# Patient Record
Sex: Female | Born: 1970 | Race: White | Hispanic: No | Marital: Single
Health system: Southern US, Academic
[De-identification: ages and names within clinical notes are randomized; demographics above are authoritative.]

## PROBLEM LIST (undated history)

## (undated) ENCOUNTER — Encounter

## (undated) ENCOUNTER — Telehealth

## (undated) ENCOUNTER — Ambulatory Visit: Payer: PRIVATE HEALTH INSURANCE

## (undated) ENCOUNTER — Encounter: Attending: Physician Assistant | Primary: Physician Assistant

## (undated) ENCOUNTER — Ambulatory Visit

## (undated) ENCOUNTER — Encounter: Attending: General Acute Care Hospital | Primary: General Acute Care Hospital

## (undated) DIAGNOSIS — Z9889 Other specified postprocedural states: Secondary | ICD-10-CM

## (undated) DIAGNOSIS — K219 Gastro-esophageal reflux disease without esophagitis: Secondary | ICD-10-CM

## (undated) DIAGNOSIS — N3281 Overactive bladder: Secondary | ICD-10-CM

## (undated) DIAGNOSIS — D649 Anemia, unspecified: Secondary | ICD-10-CM

## (undated) DIAGNOSIS — T8859XA Other complications of anesthesia, initial encounter: Secondary | ICD-10-CM

## (undated) DIAGNOSIS — F329 Major depressive disorder, single episode, unspecified: Secondary | ICD-10-CM

## (undated) DIAGNOSIS — G43909 Migraine, unspecified, not intractable, without status migrainosus: Secondary | ICD-10-CM

## (undated) DIAGNOSIS — J45909 Unspecified asthma, uncomplicated: Secondary | ICD-10-CM

## (undated) DIAGNOSIS — F32A Depression, unspecified: Secondary | ICD-10-CM

## (undated) DIAGNOSIS — R112 Nausea with vomiting, unspecified: Secondary | ICD-10-CM

## (undated) DIAGNOSIS — T4145XA Adverse effect of unspecified anesthetic, initial encounter: Secondary | ICD-10-CM

## (undated) DIAGNOSIS — J302 Other seasonal allergic rhinitis: Secondary | ICD-10-CM

## (undated) DIAGNOSIS — F419 Anxiety disorder, unspecified: Secondary | ICD-10-CM

## (undated) DIAGNOSIS — C50919 Malignant neoplasm of unspecified site of unspecified female breast: Secondary | ICD-10-CM

## (undated) HISTORY — DX: Unspecified asthma, uncomplicated: J45.909

## (undated) HISTORY — PX: ABDOMINAL HYSTERECTOMY: SHX81

## (undated) HISTORY — DX: Anxiety disorder, unspecified: F41.9

## (undated) HISTORY — DX: Overactive bladder: N32.81

## (undated) HISTORY — DX: Gastro-esophageal reflux disease without esophagitis: K21.9

## (undated) HISTORY — DX: Major depressive disorder, single episode, unspecified: F32.9

## (undated) HISTORY — DX: Other seasonal allergic rhinitis: J30.2

## (undated) HISTORY — DX: Malignant neoplasm of unspecified site of unspecified female breast: C50.919

## (undated) HISTORY — PX: SINUSOTOMY: SHX291

## (undated) HISTORY — DX: Depression, unspecified: F32.A

## (undated) HISTORY — PX: BREAST SURGERY: SHX581

## (undated) HISTORY — DX: Migraine, unspecified, not intractable, without status migrainosus: G43.909

## (undated) HISTORY — DX: Anemia, unspecified: D64.9

---
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
## 2015-06-11 HISTORY — PX: MASTECTOMY: SHX3

## 2016-01-18 DIAGNOSIS — C50912 Malignant neoplasm of unspecified site of left female breast: Secondary | ICD-10-CM | POA: Insufficient documentation

## 2016-12-19 ENCOUNTER — Encounter (INDEPENDENT_AMBULATORY_CARE_PROVIDER_SITE_OTHER): Payer: Self-pay

## 2017-01-03 ENCOUNTER — Ambulatory Visit
Admission: RE | Admit: 2017-01-03 | Discharge: 2017-01-03 | Disposition: A | Discharge status: Home / Self Care | Payer: Commercial Managed Care - PPO | Attending: Surgery | Admitting: Surgery

## 2017-01-03 ENCOUNTER — Ambulatory Visit
Admission: RE | Admit: 2017-01-03 | Discharge: 2017-01-03 | Disposition: A | Discharge status: Home / Self Care | Payer: Commercial Managed Care - PPO

## 2017-01-03 DIAGNOSIS — M549 Dorsalgia, unspecified: Principal | ICD-10-CM

## 2017-01-03 DIAGNOSIS — Z170 Estrogen receptor positive status [ER+]: Secondary | ICD-10-CM

## 2017-01-03 DIAGNOSIS — C50912 Malignant neoplasm of unspecified site of left female breast: Principal | ICD-10-CM

## 2017-01-05 DIAGNOSIS — Z170 Estrogen receptor positive status [ER+]: Secondary | ICD-10-CM

## 2017-01-05 DIAGNOSIS — C50112 Malignant neoplasm of central portion of left female breast: Secondary | ICD-10-CM | POA: Insufficient documentation

## 2017-01-16 ENCOUNTER — Ambulatory Visit
Admission: RE | Admit: 2017-01-16 | Discharge: 2017-01-16 | Disposition: A | Discharge status: Home / Self Care | Payer: Commercial Managed Care - PPO | Attending: Hematology & Oncology | Admitting: Hematology & Oncology

## 2017-01-16 ENCOUNTER — Ambulatory Visit
Admission: RE | Admit: 2017-01-16 | Discharge: 2017-01-16 | Disposition: A | Discharge status: Home / Self Care | Payer: Commercial Managed Care - PPO

## 2017-01-16 DIAGNOSIS — C50112 Malignant neoplasm of central portion of left female breast: Principal | ICD-10-CM

## 2017-01-16 DIAGNOSIS — Z170 Estrogen receptor positive status [ER+]: Secondary | ICD-10-CM

## 2017-01-16 DIAGNOSIS — Z853 Personal history of malignant neoplasm of breast: Secondary | ICD-10-CM

## 2017-01-16 MED ORDER — TAMOXIFEN 20 MG TABLET
ORAL_TABLET | Freq: Every day | ORAL | 5 refills | 0 days | Status: CP
Start: 2017-01-16 — End: 2017-06-13

## 2017-02-04 ENCOUNTER — Ambulatory Visit
Admission: RE | Admit: 2017-02-04 | Discharge: 2017-02-04 | Disposition: A | Discharge status: Home / Self Care | Payer: Commercial Managed Care - PPO

## 2017-02-04 ENCOUNTER — Ambulatory Visit
Admission: RE | Admit: 2017-02-04 | Discharge: 2017-02-04 | Disposition: A | Discharge status: Home / Self Care | Payer: Commercial Managed Care - PPO | Attending: MS" | Admitting: MS"

## 2017-02-04 DIAGNOSIS — Z853 Personal history of malignant neoplasm of breast: Principal | ICD-10-CM

## 2017-02-04 DIAGNOSIS — Z7183 Encounter for nonprocreative genetic counseling: Principal | ICD-10-CM

## 2017-02-12 ENCOUNTER — Encounter: Payer: Self-pay | Admitting: Family Medicine

## 2017-02-12 ENCOUNTER — Ambulatory Visit (INDEPENDENT_AMBULATORY_CARE_PROVIDER_SITE_OTHER): Payer: Managed Care, Other (non HMO) | Admitting: Family Medicine

## 2017-02-12 VITALS — BP 112/64 | HR 76 | Temp 99.1°F | Resp 16 | Ht 66.0 in | Wt 209.1 lb

## 2017-02-12 DIAGNOSIS — M2550 Pain in unspecified joint: Secondary | ICD-10-CM | POA: Diagnosis not present

## 2017-02-12 DIAGNOSIS — Z170 Estrogen receptor positive status [ER+]: Secondary | ICD-10-CM | POA: Diagnosis not present

## 2017-02-12 DIAGNOSIS — Z23 Encounter for immunization: Secondary | ICD-10-CM | POA: Diagnosis not present

## 2017-02-12 DIAGNOSIS — E6609 Other obesity due to excess calories: Secondary | ICD-10-CM | POA: Insufficient documentation

## 2017-02-12 DIAGNOSIS — C50112 Malignant neoplasm of central portion of left female breast: Secondary | ICD-10-CM | POA: Diagnosis not present

## 2017-02-12 DIAGNOSIS — G4730 Sleep apnea, unspecified: Secondary | ICD-10-CM | POA: Diagnosis not present

## 2017-02-12 DIAGNOSIS — R768 Other specified abnormal immunological findings in serum: Secondary | ICD-10-CM | POA: Diagnosis not present

## 2017-02-12 DIAGNOSIS — K130 Diseases of lips: Secondary | ICD-10-CM

## 2017-02-12 DIAGNOSIS — G43909 Migraine, unspecified, not intractable, without status migrainosus: Secondary | ICD-10-CM | POA: Insufficient documentation

## 2017-02-12 DIAGNOSIS — Z6833 Body mass index (BMI) 33.0-33.9, adult: Secondary | ICD-10-CM | POA: Diagnosis not present

## 2017-02-12 DIAGNOSIS — J45909 Unspecified asthma, uncomplicated: Secondary | ICD-10-CM | POA: Insufficient documentation

## 2017-02-12 DIAGNOSIS — F32A Depression, unspecified: Secondary | ICD-10-CM | POA: Insufficient documentation

## 2017-02-12 DIAGNOSIS — Z6835 Body mass index (BMI) 35.0-35.9, adult: Secondary | ICD-10-CM

## 2017-02-12 DIAGNOSIS — F329 Major depressive disorder, single episode, unspecified: Secondary | ICD-10-CM | POA: Insufficient documentation

## 2017-02-12 MED ORDER — ESCITALOPRAM OXALATE 20 MG PO TABS: 20.0000 mg | ORAL_TABLET | Freq: Every day | ORAL | 3 refills | Status: DC

## 2017-02-12 NOTE — Progress Notes (Signed)
Chief Complaint  Patient presents with  . Follow-up    est  . Breast Cancer  . Depression   Complicated patient who moved to [place] from [place] a couple of months ago Has breast cancer and haas established with breast cancer surgeon and oncology at [facility]  Records in [place] section.  Old PCP records are requested. 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  She has been referred from her oncologist for neuropsychiatric testing.  Still hopes to improve.  She has extensive joint pain on the tamoxifen.  Has a history of a positive ANA in her youth and years of joint pain that proceeds the cancer.  She feels she has an undiagnosed inflammatory or auto immune arthritis condition.  Her daughter has type 1 diabetes and hashimoto's thyroiditis. She hurts too much to stand for long periods or walk for exercise.  She is obese.  This is impacting her health. She has rash at corners of mouth for many years She has snoring and "stops breathing at night" per her adult daughter.  Never been worked up for OSA.  Has daytime fatigue. Had a bilateral mastectomy.  Is considering reconstruction.  Discussed pros and cons of surgery.   Patient Active Problem List   Diagnosis Date Noted  . Asthma 02/12/2017  . Depression 02/12/2017  . Migraines 02/12/2017  . Arthralgia 02/12/2017  . Sleep-disordered breathing 02/12/2017  . Positive ANA (antinuclear antibody) 02/12/2017  . Angular cheilitis 02/12/2017  . Malignant neoplasm of central portion of left breast in female, estrogen receptor positive ([place]) 01/05/2017    Outpatient Encounter Prescriptions as of 02/12/2017  Medication Sig  . albuterol (PROVENTIL HFA;VENTOLIN HFA) 108 (90 Base) MCG/ACT inhaler Inhale into the lungs.  . Cholecalciferol (VITAMIN D3) 5000 units TABS Take by mouth every other day.   . escitalopram (LEXAPRO) 20 MG tablet Take 1  tablet (20 mg total) by mouth daily.  . naproxen sodium (ANAPROX) 220 MG tablet Take by mouth.  . rizatriptan (MAXALT-MLT) 10 MG disintegrating tablet Take 10 mg by mouth as needed for migraine. May repeat in 2 hours if needed  . tamoxifen (NOLVADEX) 20 MG tablet   . topiramate (TOPAMAX) 25 MG capsule Take 25 mg by mouth.   No facility-administered encounter medications on file as of 02/12/2017.     Past Medical History:  Diagnosis Date  . Allergy   . Anemia   . Anxiety   . Asthma   . Cancer ([place])    breast  . Depression   . GERD (gastroesophageal reflux disease)     Past Surgical History:  Procedure Laterality Date  . ABDOMINAL HYSTERECTOMY     breast cancer  . ANKLE RECONSTRUCTION Right 1989  . BREAST SURGERY    . KNEE SURGERY Right 1989   bone spur  . SINUSOTOMY      Social History   Social History  . Marital status: Single    Spouse name: N/A  . Number of children: 1  . Years of education: 63   Occupational History  . disabled    Social History Main Topics  . Smoking status: Never Smoker  . Smokeless tobacco: Never Used  . Alcohol use No  . Drug use: No  . Sexual activity: Not Currently   Other Topics Concern  . Not on file   Social History Narrative   Bachelors degree   Accounting   Lives with  daughter [name] who has autism and diabetes   Likes to sew, quilt, crafts, crochet    Family History  Problem Relation Age of Onset  . Arthritis Mother   . COPD Mother   . Depression Mother   . Diabetes Mother   . Hyperlipidemia Mother   . Kidney disease Father   . Heart disease Father 46  . Drug abuse Father   . Hypertension Father   . Diabetes Daughter   . Hashimoto's thyroiditis Daughter   . Irritable bowel syndrome Daughter   . Autism spectrum disorder Daughter   . Early death Maternal Grandmother        drowned  . Early death Maternal Grandfather   . Heart disease Maternal Grandfather   . Heart disease Paternal Grandfather     Review of  Systems  Constitutional: Positive for malaise/fatigue. Negative for chills, fever and weight loss.  HENT: Negative for congestion and hearing loss.   Eyes: Negative for blurred vision and pain.  Respiratory: Negative for cough and shortness of breath.   Cardiovascular: Negative for chest pain and leg swelling.  Gastrointestinal: Negative for abdominal pain, constipation, diarrhea and heartburn.  Genitourinary: Negative for dysuria and frequency.  Musculoskeletal: Positive for back pain, joint pain and myalgias. Negative for falls.  Neurological: Positive for headaches. Negative for dizziness and seizures.       Migraines - managed  Psychiatric/Behavioral: Positive for depression. The patient is nervous/anxious. The patient does not have insomnia.     BP 112/64 (BP Location: Right Arm, Patient Position: Sitting, Cuff Size: Large)   Pulse 76   Temp 99.1 F (37.3 C) (Temporal)   Resp 16   Ht _0  (1.676 m)   Wt 209 lb 1.3 oz (94.8 kg)   SpO2 100%   BMI 33.75 kg/m   Physical Exam  Constitutional: She appears well-developed and well-nourished.  HENT:  Head: Normocephalic and atraumatic.  Mouth/Throat: Oropharynx is clear and moist.  Mild cheilitis  Eyes: Pupils are equal, round, and reactive to light. Conjunctivae are normal.  Neck: Normal range of motion. Neck supple.  Cardiovascular: Normal rate, regular rhythm and normal heart sounds.   Pulmonary/Chest: Effort normal and breath sounds normal. No respiratory distress.  Abdominal:  obese  Musculoskeletal: Normal range of motion. She exhibits no edema.  Neurological: She is alert.  Gait normal  Skin: Skin is warm and dry.  Psychiatric: Her behavior is normal. Thought content normal.  Depressed mood.  Slow responses  Nursing note and vitals reviewed.  ASSESSMENT/PLAN:  1. Arthralgia, unspecified joint  - Rheumatoid Factor - Sed Rate (ESR) - Antinuclear Antib (ANA) - Vitamin B12  2. Sleep-disordered breathing  -  Nocturnal polysomnography (NPSG); Future  3. Positive ANA (antinuclear antibody)  - Antinuclear Antib (ANA)  4. Angular cheilitis  - Vitamin B12  5. Need for influenza vaccination   6. Need for pneumococcal vaccination   7. Malignant neoplasm of central portion of left breast in female, estrogen receptor positive ([place])    Patient Instructions  I will review old records- need records PCP I will check on the neuropsychiatry referral I have ordered arthritis lab tests I have ordered a sleep test  Come back in a month for a complete PE Call if you need refills or have questions Feel free to e mail on My chart   Raylene Everts, MD

## 2017-02-12 NOTE — Patient Instructions (Addendum)
I will review old records- need records PCP I will check on the neuropsychiatry referral I have ordered arthritis lab tests I have ordered a sleep test  Come back in a month for a complete PE Call if you need refills or have questions Feel free to e mail on My chart

## 2017-02-13 ENCOUNTER — Encounter: Payer: Self-pay | Admitting: Family Medicine

## 2017-02-13 ENCOUNTER — Ambulatory Visit: Admission: RE | Admit: 2017-02-13 | Discharge: 2017-02-13 | Payer: Commercial Managed Care - PPO

## 2017-02-13 LAB — ANA: ANA: NEGATIVE

## 2017-02-13 LAB — VITAMIN B12: Vitamin B-12: 439 pg/mL (ref 200–1100)

## 2017-02-13 LAB — SEDIMENTATION RATE: Sed Rate: 6 mm/h (ref 0–20)

## 2017-02-13 LAB — RHEUMATOID FACTOR

## 2017-02-18 ENCOUNTER — Telehealth: Payer: Self-pay

## 2017-02-18 MED ORDER — TOPIRAMATE 25 MG PO CPSP: 25.0000 mg | ORAL_CAPSULE | Freq: Every day | ORAL | 3 refills | Status: DC

## 2017-02-18 NOTE — Telephone Encounter (Signed)
authorize

## 2017-02-18 NOTE — Telephone Encounter (Signed)
Patient called requesting 3 month supply of topiramate.  Please advise.

## 2017-02-26 ENCOUNTER — Encounter: Payer: Self-pay | Admitting: Family Medicine

## 2017-02-26 DIAGNOSIS — E559 Vitamin D deficiency, unspecified: Secondary | ICD-10-CM | POA: Insufficient documentation

## 2017-02-26 DIAGNOSIS — R7303 Prediabetes: Secondary | ICD-10-CM | POA: Insufficient documentation

## 2017-02-26 DIAGNOSIS — E785 Hyperlipidemia, unspecified: Secondary | ICD-10-CM | POA: Insufficient documentation

## 2017-03-07 ENCOUNTER — Ambulatory Visit: Payer: Managed Care, Other (non HMO) | Attending: Family Medicine | Admitting: Neurology

## 2017-03-07 DIAGNOSIS — Z79899 Other long term (current) drug therapy: Secondary | ICD-10-CM | POA: Insufficient documentation

## 2017-03-07 DIAGNOSIS — G4730 Sleep apnea, unspecified: Secondary | ICD-10-CM

## 2017-03-07 DIAGNOSIS — R0683 Snoring: Secondary | ICD-10-CM | POA: Insufficient documentation

## 2017-03-14 ENCOUNTER — Encounter: Payer: Managed Care, Other (non HMO) | Admitting: Family Medicine

## 2017-03-15 NOTE — Procedures (Signed)
  [place] A. Merlene Laughter, MD     [email or web address]             NOCTURNAL POLYSOMNOGRAPHY   LOCATION: [facility]   Patient Name: Pamela Long, Pamela Long Date: 03/07/2017 Gender: Female D.O.B: 08/05/1970 Age (years): 45 Referring Provider: Raylene Everts Height (inches): 66 Interpreting Physician: Phillips Odor MD, ABSM Weight (lbs): 209 RPSGT: Peak, Robert BMI: 34 MRN: [id number] Neck Size: 17.00 CLINICAL INFORMATION Sleep Study Type: NPSG  Indication for sleep study: N/A  Epworth Sleepiness Score:  SLEEP STUDY TECHNIQUE As per the AASM Manual for the Scoring of Sleep and Associated Events v2.3 (April 2016) with a hypopnea requiring 4% desaturations.  The channels recorded and monitored were frontal, central and occipital EEG, electrooculogram (EOG), submentalis EMG (chin), nasal and oral airflow, thoracic and abdominal wall motion, anterior tibialis EMG, snore microphone, electrocardiogram, and pulse oximetry.  MEDICATIONS Medications self-administered by patient taken the night of the study : TOPIRAMATE, ESCITALOPRAM OXALATE  Current Outpatient Prescriptions:  .  albuterol (PROVENTIL HFA;VENTOLIN HFA) 108 (90 Base) MCG/ACT inhaler, Inhale into the lungs., Disp: , Rfl:  .  Cholecalciferol (VITAMIN D3) 5000 units TABS, Take by mouth every other day. , Disp: , Rfl:  .  escitalopram (LEXAPRO) 20 MG tablet, Take 1 tablet (20 mg total) by mouth daily., Disp: 90 tablet, Rfl: 3 .  naproxen sodium (ANAPROX) 220 MG tablet, Take by mouth., Disp: , Rfl:  .  rizatriptan (MAXALT-MLT) 10 MG disintegrating tablet, Take 10 mg by mouth as needed for migraine. May repeat in 2 hours if needed, Disp: , Rfl:  .  tamoxifen (NOLVADEX) 20 MG tablet, , Disp: , Rfl:  .  topiramate (TOPAMAX) 25 MG capsule, Take 1 capsule (25 mg total) by mouth daily., Disp: 90 capsule, Rfl: 3   SLEEP ARCHITECTURE The study was initiated at 10:40:35 PM and ended at 5:02:56 AM.  Sleep onset  time was 94.3 minutes and the sleep efficiency was 65.4%. The total sleep time was 250.0 minutes.  Stage REM latency was 159.5 minutes.  The patient spent 3.00% of the night in stage N1 sleep, 83.60% in stage N2 sleep, 0.00% in stage N3 and 13.40% in REM.  Alpha intrusion was absent.  Supine sleep was 48.21%.  RESPIRATORY PARAMETERS The overall apnea/hypopnea index (AHI) was 1.7 per hour. There were 4 total apneas, including 0 obstructive, 4 central and 0 mixed apneas. There were 3 hypopneas and 0 RERAs.  The AHI during Stage REM sleep was 0.0 per hour.  AHI while supine was 0.0 per hour.  The mean oxygen saturation was 95.14%. The minimum SpO2 during sleep was 90.00%.  snoring was noted during this study.  CARDIAC DATA The 2 lead EKG demonstrated sinus rhythm. The mean heart rate was N/A beats per minute. Other EKG findings include: None. LEG MOVEMENT DATA The total PLMS were 0 with a resulting PLMS index of 0.00. Associated arousal with leg movement index was 0.0.  IMPRESSIONS -  Absent slow wave sleep is observed. Otherwise, this is study is unremarkable.  Delano Metz, MD Diplomate, American Board of Sleep Medicine.   ELECTRONICALLY SIGNED ON:  03/15/2017, 5:15 PM [place] PH: [phone]   FX: [phone] [place]

## 2017-04-01 ENCOUNTER — Encounter: Payer: Self-pay | Admitting: Family Medicine

## 2017-04-01 ENCOUNTER — Ambulatory Visit (INDEPENDENT_AMBULATORY_CARE_PROVIDER_SITE_OTHER): Payer: Managed Care, Other (non HMO) | Admitting: Family Medicine

## 2017-04-01 VITALS — BP 132/82 | HR 88 | Temp 97.4°F | Resp 16 | Ht 66.0 in | Wt 208.1 lb

## 2017-04-01 DIAGNOSIS — C50112 Malignant neoplasm of central portion of left female breast: Secondary | ICD-10-CM | POA: Diagnosis not present

## 2017-04-01 DIAGNOSIS — Z0000 Encounter for general adult medical examination without abnormal findings: Secondary | ICD-10-CM | POA: Diagnosis not present

## 2017-04-01 DIAGNOSIS — E669 Obesity, unspecified: Secondary | ICD-10-CM | POA: Diagnosis not present

## 2017-04-01 DIAGNOSIS — R7303 Prediabetes: Secondary | ICD-10-CM

## 2017-04-01 DIAGNOSIS — Z23 Encounter for immunization: Secondary | ICD-10-CM | POA: Diagnosis not present

## 2017-04-01 DIAGNOSIS — Z170 Estrogen receptor positive status [ER+]: Secondary | ICD-10-CM

## 2017-04-01 DIAGNOSIS — E785 Hyperlipidemia, unspecified: Secondary | ICD-10-CM

## 2017-04-01 MED ORDER — ALBUTEROL SULFATE HFA 108 (90 BASE) MCG/ACT IN AERS: 1.0000 | INHALATION_SPRAY | RESPIRATORY_TRACT | 1 refills | Status: DC | PRN

## 2017-04-01 MED ORDER — TOPIRAMATE 50 MG PO TABS: 50.0000 mg | ORAL_TABLET | Freq: Two times a day (BID) | ORAL | 3 refills | Status: DC

## 2017-04-01 NOTE — Progress Notes (Signed)
Chief Complaint  Patient presents with  . 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  Had some depression and noted poor coping skills prior to the chemo and cancer diagnosis.  She will have results sent to me.   Patient Active Problem List   Diagnosis Date Noted  . Vitamin D deficiency 02/26/2017  . Prediabetes 02/26/2017  . HLD (hyperlipidemia) 02/26/2017  . Asthma 02/12/2017  . Depression 02/12/2017  . Migraines 02/12/2017  . Arthralgia 02/12/2017  . Sleep-disordered breathing 02/12/2017  . Positive ANA (antinuclear antibody) 02/12/2017  . Angular cheilitis 02/12/2017  . Obesity (BMI 35.0-39.9 without comorbidity) 02/12/2017  . Malignant neoplasm of central portion of left breast in female, estrogen receptor positive ([place]) 01/05/2017    Outpatient Encounter Prescriptions as of 04/01/2017  Medication Sig  . albuterol (PROVENTIL HFA;VENTOLIN HFA) 108 (90 Base) MCG/ACT inhaler Inhale 1 puff into the lungs every 4 (four) hours as needed for wheezing or shortness of breath.  . Cholecalciferol (VITAMIN D3) 5000 units TABS Take by mouth every other day.   . escitalopram (LEXAPRO) 20 MG tablet Take 1 tablet (20 mg total) by mouth daily.  . naproxen sodium (ANAPROX) 220 MG tablet Take by mouth.  . rizatriptan (MAXALT-MLT) 10 MG disintegrating tablet Take 10 mg by mouth as needed for migraine. May repeat in 2 hours if needed  . tamoxifen (NOLVADEX) 20 MG tablet   . [DISCONTINUED] albuterol (PROVENTIL HFA;VENTOLIN HFA) 108 (90 Base) MCG/ACT inhaler Inhale into the lungs.  . topiramate (TOPAMAX) 50 MG tablet Take 1 tablet (50 mg total) by mouth 2 (two) times daily.   No facility-administered encounter  medications on file as of 04/01/2017.     Allergies  Allergen Reactions  . Amoxicillin Hives    Rash only  . Caffeine Diarrhea, Nausea Only and Palpitations    Other reaction(s): Headache  . Tetanus Toxoids Swelling    Local reaction    Review of Systems  Constitutional: Positive for diaphoresis. Negative for activity change, appetite change and unexpected weight change.       Face flushes  HENT: Negative for congestion, dental problem, postnasal drip and rhinorrhea.   Eyes: Negative for redness and visual disturbance.  Respiratory: Negative for cough and shortness of breath.   Cardiovascular: Positive for leg swelling. Negative for chest pain and palpitations.       Left  Gastrointestinal: Negative for abdominal pain, constipation and diarrhea.  Endocrine: Positive for heat intolerance. Negative for cold intolerance.  Genitourinary: Negative for difficulty urinating and frequency.  Musculoskeletal: Negative for arthralgias and back pain.  Neurological: Negative for dizziness and headaches.  Psychiatric/Behavioral: Positive for decreased concentration. Negative for dysphoric mood and sleep disturbance. The patient is not nervous/anxious.        Per pt memory is poor    BP 132/82 (BP Location: Right Arm, Patient Position: Sitting, Cuff Size: Normal)   Pulse 88   Temp (!) 97.4 F (36.3 C) (Temporal)   Resp 16   Ht 5\' 6"  (1.676 m)   Wt 208 lb 1.9 oz (94.4 kg)   SpO2 97%   BMI 33.59 kg/m   Physical Exam  BP 132/82 (BP Location: Right Arm, Patient Position: Sitting, Cuff Size: Normal)  Pulse 88   Temp (!) 97.4 F (36.3 C) (Temporal)   Resp 16   Ht 5\' 6"  (1.676 m)   Wt 208 lb 1.9 oz (94.4 kg)   SpO2 97%   BMI 33.59 kg/m   General Appearance:    Alert, cooperative, no distress, appears stated age.  Hesitant speech at times  Head:    Normocephalic, without obvious abnormality, atraumatic  Eyes:    PERRL, conjunctiva/corneas clear, EOM's intact, fundi    benign, both  eyes  Ears:    Normal TM's and external ear canals, both ears  Nose:   Nares normal, septum midline, mucosa normal, no drainage    or sinus tenderness  Throat:   Lips, mucosa, and tongue normal; teeth and gums normal  Neck:   Supple, symmetrical, trachea midline, no adenopathy;    thyroid:  no enlargement/tenderness/nodules; no carotid   bruit  Back:     Symmetric, no curvature, ROM normal, no CVA tenderness  Lungs:     Clear to auscultation bilaterally, respirations unlabored  Chest Wall:    No tenderness or deformity.  Mastectomy scars bilat   Heart:    Regular rate and rhythm, S1 and S2 normal, no murmur, rub   or gallop  Breast Exam:    Bilateral mastectomy  Abdomen:     Soft, non-tender, bowel sounds active all four quadrants,    no masses, no organomegaly  Extremities:   Extremities normal, atraumatic, no cyanosis, trace edema is on L ankle  Pulses:   2+ and symmetric all extremities  Skin:   Skin color, texture, turgor normal, no rashes or lesions  Lymph nodes:   Cervical, supraclavicular, and axillary nodes normal  Neurologic:   normal strength, sensation and reflexes    throughout     ASSESSMENT/PLAN:  1. Prediabetes - CBC - COMPLETE METABOLIC PANEL WITH GFR - Lipid panel - Urinalysis, Routine w reflex microscopic - Hemoglobin A1c  2. Obesity (BMI 35.0-39.9 without comorbidity) Discussed diet and slow introduction to an exercise program given her level of deconditioning  3. Malignant neoplasm of central portion of left breast in female, estrogen receptor positive ([place])  4. Hyperlipidemia, unspecified hyperlipidemia type  5. Need for pneumococcal vaccination - Pneumococcal conjugate vaccine 13-valent IM  6. Encounter for annual health examination    Patient Instructions  Gradually increase your exercise/activity Reduce carbohydrates and sweets  Labs ordered today I will send you a letter with your test results.  If there is anything of concern, we will  call right away.  See me in six months Call sooner for problems     Raylene Everts, MD

## 2017-04-01 NOTE — Patient Instructions (Signed)
Gradually increase your exercise/activity Reduce carbohydrates and sweets  Labs ordered today I will send you a letter with your test results.  If there is anything of concern, we will call right away.  See me in six months Call sooner for problems

## 2017-04-02 ENCOUNTER — Telehealth: Payer: Self-pay | Admitting: Family Medicine

## 2017-04-02 ENCOUNTER — Encounter: Payer: Self-pay | Admitting: Family Medicine

## 2017-04-02 LAB — COMPLETE METABOLIC PANEL WITH GFR
AG RATIO: 1.6 (calc) (ref 1.0–2.5)
ALBUMIN MSPROF: 3.9 g/dL (ref 3.6–5.1)
ALKALINE PHOSPHATASE (APISO): 89 U/L (ref 33–115)
ALT: 13 U/L (ref 6–29)
AST: 13 U/L (ref 10–35)
BILIRUBIN TOTAL: 0.4 mg/dL (ref 0.2–1.2)
BUN: 16 mg/dL (ref 7–25)
CHLORIDE: 107 mmol/L (ref 98–110)
CO2: 25 mmol/L (ref 20–32)
Calcium: 8.7 mg/dL (ref 8.6–10.2)
Creat: 0.82 mg/dL (ref 0.50–1.10)
GFR, EST AFRICAN AMERICAN: 99 mL/min/{1.73_m2} (ref >=60)
GFR, Est Non African American: 85 mL/min/{1.73_m2} (ref >=60)
GLOBULIN: 2.5 g/dL (ref 1.9–3.7)
GLUCOSE: 90 mg/dL (ref 65–99)
POTASSIUM: 4.1 mmol/L (ref 3.5–5.3)
Sodium: 139 mmol/L (ref 135–146)
TOTAL PROTEIN: 6.4 g/dL (ref 6.1–8.1)

## 2017-04-02 LAB — CBC
HEMATOCRIT: 38.9 % (ref 35.0–45.0)
Hemoglobin: 12.9 g/dL (ref 11.7–15.5)
MCH: 29 pg (ref 27.0–33.0)
MCHC: 33.2 g/dL (ref 32.0–36.0)
MCV: 87.4 fL (ref 80.0–100.0)
MPV: 9.6 fL (ref 7.5–12.5)
Platelets: 211 10*3/uL (ref 140–400)
RBC: 4.45 10*6/uL (ref 3.80–5.10)
RDW: 12.9 % (ref 11.0–15.0)
WBC: 6.1 10*3/uL (ref 3.8–10.8)

## 2017-04-02 LAB — URINALYSIS, ROUTINE W REFLEX MICROSCOPIC
Bilirubin Urine: NEGATIVE
Glucose, UA: NEGATIVE
Hgb urine dipstick: NEGATIVE
KETONES UR: NEGATIVE
Leukocytes, UA: NEGATIVE
NITRITE: NEGATIVE
PH: 6 (ref 5.0–8.0)
Protein, ur: NEGATIVE
SPECIFIC GRAVITY, URINE: 1.024 (ref 1.001–1.03)

## 2017-04-02 LAB — HEMOGLOBIN A1C
EAG (MMOL/L): 5.7 (calc)
Hgb A1c MFr Bld: 5.2 % of total Hgb (ref <5.7)
MEAN PLASMA GLUCOSE: 103 (calc)

## 2017-04-02 LAB — LIPID PANEL
CHOLESTEROL: 168 mg/dL (ref <200)
HDL: 42 mg/dL — ABNORMAL LOW (ref >50)
LDL CHOLESTEROL (CALC): 98 mg/dL
Non-HDL Cholesterol (Calc): 126 mg/dL (calc) (ref <130)
Total CHOL/HDL Ratio: 4 (calc) (ref <5.0)
Triglycerides: 189 mg/dL — ABNORMAL HIGH (ref <150)

## 2017-04-02 NOTE — Telephone Encounter (Signed)
Patient has a question about the site of the injection that she had here in the office yesterday.

## 2017-04-02 NOTE — Telephone Encounter (Signed)
Spoke to pt, states injection site from 10 [phone] 18  is a little red, warm raised, advised to apply ice prn and call for further problems.

## 2017-04-04 ENCOUNTER — Ambulatory Visit (INDEPENDENT_AMBULATORY_CARE_PROVIDER_SITE_OTHER): Payer: Managed Care, Other (non HMO) | Admitting: Family Medicine

## 2017-04-04 ENCOUNTER — Encounter: Payer: Self-pay | Admitting: Family Medicine

## 2017-04-04 ENCOUNTER — Encounter: Payer: Managed Care, Other (non HMO) | Admitting: Family Medicine

## 2017-04-04 VITALS — BP 112/60 | HR 92 | Temp 98.3°F | Resp 16 | Ht 66.0 in | Wt 210.0 lb

## 2017-04-04 DIAGNOSIS — T50905A Adverse effect of unspecified drugs, medicaments and biological substances, initial encounter: Secondary | ICD-10-CM

## 2017-04-04 NOTE — Progress Notes (Signed)
    Chief Complaint  Patient presents with  . Follow-up    local reaction  Patient had her pneumonia shot 3 days ago.  Her right arm has swelling itching heat and some tenderness.  She has pain with movement.  She like to have this checked.  No swelling of her lips or throat, no shortness of breath, no malaise or fever   Patient Active Problem List   Diagnosis Date Noted  . Vitamin D deficiency 02/26/2017  . Prediabetes 02/26/2017  . HLD (hyperlipidemia) 02/26/2017  . Asthma 02/12/2017  . Depression 02/12/2017  . Migraines 02/12/2017  . Arthralgia 02/12/2017  . Sleep-disordered breathing 02/12/2017  . Positive ANA (antinuclear antibody) 02/12/2017  . Angular cheilitis 02/12/2017  . Obesity (BMI 35.0-39.9 without comorbidity) 02/12/2017  . Malignant neoplasm of central portion of left breast in female, estrogen receptor positive ([place]) 01/05/2017    Outpatient Encounter Prescriptions as of 04/04/2017  Medication Sig  . albuterol (PROVENTIL HFA;VENTOLIN HFA) 108 (90 Base) MCG/ACT inhaler Inhale 1 puff into the lungs every 4 (four) hours as needed for wheezing or shortness of breath.  . Cholecalciferol (VITAMIN D3) 5000 units TABS Take by mouth every other day.   . escitalopram (LEXAPRO) 20 MG tablet Take 1 tablet (20 mg total) by mouth daily.  . naproxen sodium (ANAPROX) 220 MG tablet Take by mouth.  . rizatriptan (MAXALT-MLT) 10 MG disintegrating tablet Take 10 mg by mouth as needed for migraine. May repeat in 2 hours if needed  . tamoxifen (NOLVADEX) 20 MG tablet   . topiramate (TOPAMAX) 50 MG tablet Take 1 tablet (50 mg total) by mouth 2 (two) times daily.   No facility-administered encounter medications on file as of 04/04/2017.     Allergies  Allergen Reactions  . Amoxicillin Hives    Rash only  . Caffeine Diarrhea, Nausea Only and Palpitations    Other reaction(s): Headache  . Tetanus Toxoids Swelling    Local reaction    Review of Systems  Constitutional: Negative  for chills, fatigue and fever.  Skin: Positive for rash.  All other systems reviewed and are negative.    BP 112/60 (BP Location: Left Arm, Patient Position: Sitting, Cuff Size: Normal)   Pulse 92   Temp 98.3 F (36.8 C) (Temporal)   Resp 16   Ht 5\' 6"  (1.676 m)   Wt 210 lb 0.6 oz (95.3 kg)   SpO2 96%   BMI 33.90 kg/m   Physical Exam  Constitutional: She appears well-developed and well-nourished. No distress.  Appears fatigued  HENT:  Head: Normocephalic and atraumatic.  Mouth/Throat: Oropharynx is clear and moist.  Eyes: Pupils are equal, round, and reactive to light. Conjunctivae are normal.  Pulmonary/Chest: Effort normal and breath sounds normal. She has no wheezes.  Skin:  Right deltoid area has a warm, indurated, erythematous  Area surrounding the injection measuring 8 cm across.  No vesicles.  No tenderness.  No distal neurovascular compromise  Psychiatric: She has a normal mood and affect. Her behavior is normal.    ASSESSMENT/PLAN:  1. Reaction to shot, initial encounter Local allergic reaction   Patient Instructions  Ice Antihistamines Call for problems   Raylene Everts, MD

## 2017-04-04 NOTE — Patient Instructions (Signed)
Ice Antihistamines Call for problems

## 2017-04-11 ENCOUNTER — Ambulatory Visit
Admission: RE | Admit: 2017-04-11 | Discharge: 2017-04-11 | Disposition: A | Discharge status: Home / Self Care | Payer: Commercial Managed Care - PPO

## 2017-04-11 ENCOUNTER — Ambulatory Visit
Admission: RE | Admit: 2017-04-11 | Discharge: 2017-04-11 | Disposition: A | Discharge status: Home / Self Care | Payer: Commercial Managed Care - PPO | Attending: Hematology & Oncology | Admitting: Hematology & Oncology

## 2017-04-11 DIAGNOSIS — Z853 Personal history of malignant neoplasm of breast: Principal | ICD-10-CM

## 2017-04-11 DIAGNOSIS — C50112 Malignant neoplasm of central portion of left female breast: Principal | ICD-10-CM

## 2017-04-11 DIAGNOSIS — Z170 Estrogen receptor positive status [ER+]: Secondary | ICD-10-CM

## 2017-06-13 MED ORDER — TAMOXIFEN 20 MG TABLET
ORAL_TABLET | Freq: Every day | ORAL | 2 refills | 0.00000 days | Status: CP
Start: 2017-06-13 — End: 2018-04-22

## 2017-06-30 ENCOUNTER — Ambulatory Visit (INDEPENDENT_AMBULATORY_CARE_PROVIDER_SITE_OTHER): Payer: Managed Care, Other (non HMO) | Admitting: Family Medicine

## 2017-06-30 ENCOUNTER — Other Ambulatory Visit: Payer: Self-pay

## 2017-06-30 ENCOUNTER — Encounter: Payer: Self-pay | Admitting: Family Medicine

## 2017-06-30 VITALS — BP 120/86 | HR 103 | Temp 98.5°F | Resp 16 | Ht 65.5 in | Wt 215.8 lb

## 2017-06-30 DIAGNOSIS — M25531 Pain in right wrist: Secondary | ICD-10-CM | POA: Diagnosis not present

## 2017-06-30 DIAGNOSIS — W19XXXD Unspecified fall, subsequent encounter: Secondary | ICD-10-CM

## 2017-06-30 NOTE — Patient Instructions (Signed)
Refer to orthopedic surgery to be seen this week, please

## 2017-06-30 NOTE — Progress Notes (Signed)
Chief Complaint  Patient presents with  . Fall  . Wrist Pain   Patient is here for emergency room follow-up She states that 2 weeks ago she was visiting [place], and was running across the street and fell.  She tripped.  She landed on outstretched hands and hit her face.  She went to the emergency room.  X-rays were of right wrist were negative.  She did have dental trauma, facial lacerations, and some sutures.  She states everything is healing, except for her wrist.  It is still just as painful as when she fell.  She points to her lateral wrist.  She has specific difficulty with hand rotation like turning a key or doorknob.  No swelling.  No discoloration.  She is wearing an Ace wrap.  No prior wrist trauma or fracture.  Patient Active Problem List   Diagnosis Date Noted  . Vitamin D deficiency 02/26/2017  . Prediabetes 02/26/2017  . HLD (hyperlipidemia) 02/26/2017  . Asthma 02/12/2017  . Depression 02/12/2017  . Migraines 02/12/2017  . Arthralgia 02/12/2017  . Sleep-disordered breathing 02/12/2017  . Positive ANA (antinuclear antibody) 02/12/2017  . Angular cheilitis 02/12/2017  . Obesity (BMI 35.0-39.9 without comorbidity) 02/12/2017  . Malignant neoplasm of central portion of left breast in female, estrogen receptor positive ([place]) 01/05/2017    Outpatient Encounter Medications as of 06/30/2017  Medication Sig  . albuterol (PROVENTIL HFA;VENTOLIN HFA) 108 (90 Base) MCG/ACT inhaler Inhale 1 puff into the lungs every 4 (four) hours as needed for wheezing or shortness of breath.  . Cholecalciferol (VITAMIN D3) 5000 units TABS Take by mouth every other day.   . escitalopram (LEXAPRO) 20 MG tablet Take 1 tablet (20 mg total) by mouth daily.  Marland Kitchen ibuprofen (ADVIL,MOTRIN) 800 MG tablet   . naproxen sodium (ANAPROX) 220 MG tablet Take by mouth.  . rizatriptan (MAXALT-MLT) 10 MG disintegrating tablet Take 10 mg by mouth as needed for migraine. May repeat in 2 hours if needed  .  tamoxifen (NOLVADEX) 20 MG tablet   . topiramate (TOPAMAX) 50 MG tablet Take 1 tablet (50 mg total) by mouth 2 (two) times daily.   No facility-administered encounter medications on file as of 06/30/2017.     Allergies  Allergen Reactions  . Amoxicillin Hives    Rash only  . Caffeine Diarrhea, Nausea Only and Palpitations    Other reaction(s): Headache  . Tetanus Toxoids Swelling    Local reaction    Review of Systems  Constitutional: Positive for unexpected weight change. Negative for activity change and appetite change.  HENT: Positive for dental problem. Negative for congestion.        Dental fracture  Eyes: Negative for photophobia and visual disturbance.  Respiratory: Negative for cough and choking.   Cardiovascular: Negative for chest pain and palpitations.  Gastrointestinal: Negative for constipation and diarrhea.  Genitourinary: Negative for difficulty urinating, flank pain and frequency.  Musculoskeletal: Positive for arthralgias and gait problem. Negative for neck pain and neck stiffness.  Skin: Negative for wound.  Psychiatric/Behavioral: Positive for confusion and decreased concentration.       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    BP 120/86 (BP Location: Left Arm, Patient Position: Sitting, Cuff Size: Normal)   Pulse (!) 103   Temp 98.5 F (36.9 C)   Resp 16   Ht 5' 5.5" (1.664 m)   Wt 215 lb 12 oz (97.9 kg)   SpO2 96%   BMI 35.36 kg/m  Physical Exam  Constitutional: She appears well-developed and well-nourished. No distress.  HENT:  Head: Normocephalic and atraumatic.  Mouth/Throat: Oropharynx is clear and moist.  Facial trauma has  healed.  Left incisor with 25% distal chip  Eyes: EOM are normal. Pupils are equal, round, and reactive to light.  Neck: Normal range of motion.  Cardiovascular: Normal rate and regular rhythm.  Pulmonary/Chest: Effort normal and breath sounds normal.  Musculoskeletal: Normal range of  motion. She exhibits no edema.  Right wrist appears normal.  Symmetric with left.  Good range of motion.  Pain with ulnar deviation and rotation.  Tenderness just beyond ulnar styloid.  Good grip.  Normal sensation.  Normal pulses.  Lymphadenopathy:    She has no cervical adenopathy.    ASSESSMENT/PLAN:  1. Wrist pain, acute, right Suspect injury triangular cartilage - Ambulatory referral to Orthopedic Surgery  2. Fall, subsequent encounter Continue ice, rest, anti-inflammatories as needed - Ambulatory referral to Orthopedic Surgery   Patient Instructions  Refer to orthopedic surgery to be seen this week, please   Raylene Everts, MD

## 2017-07-08 ENCOUNTER — Encounter: Payer: Self-pay | Admitting: Orthopaedic Surgery

## 2017-07-08 ENCOUNTER — Telehealth (HOSPITAL_COMMUNITY): Payer: Self-pay | Admitting: Family Medicine

## 2017-07-08 ENCOUNTER — Ambulatory Visit (INDEPENDENT_AMBULATORY_CARE_PROVIDER_SITE_OTHER): Payer: Managed Care, Other (non HMO) | Admitting: Orthopaedic Surgery

## 2017-07-08 VITALS — BP 129/86 | HR 97 | Ht 66.0 in | Wt 212.0 lb

## 2017-07-08 DIAGNOSIS — S66911A Strain of unspecified muscle, fascia and tendon at wrist and hand level, right hand, initial encounter: Secondary | ICD-10-CM | POA: Diagnosis not present

## 2017-07-08 DIAGNOSIS — M778 Other enthesopathies, not elsewhere classified: Secondary | ICD-10-CM

## 2017-07-08 NOTE — Telephone Encounter (Signed)
Patient came to make appt we are waiting on her referrel to come in and will clla to  make her appts.

## 2017-07-08 NOTE — Progress Notes (Signed)
Subjective:    Patient ID: Pamela Long, female    DOB: [birth year], 47 y.o.   MRN: [id number]  HPI  She was in [place] visiting and ran across the street and fell and hurt her right dominant wrist on 06-20-17.  She hurt her left knee as well.  She was seen in an ER there and x-rays were done.  X-rays were negative.  I have reviewed the x-rays and reports and notes.  She continues to have pain in the right wrist area, volar side.  She likes to knit and crochet a lot and cannot do it now.  She is tried of hurting.  She has taken ibuprofen 800 mgm bid.  She has used ice.  She has no splint. She has no redness, no numbness.    Review of Systems  Respiratory: Positive for shortness of breath.   Musculoskeletal: Positive for arthralgias.  Psychiatric/Behavioral: The patient is nervous/anxious.   All other systems reviewed and are negative.  Past Medical History:  Diagnosis Date  . Allergy   . Anemia   . Anxiety   . Asthma   . Cancer ([facility])    breast  . Depression   . GERD (gastroesophageal reflux disease)   . HLD (hyperlipidemia) 02/26/2017    Past Surgical History:  Procedure Laterality Date  . ABDOMINAL HYSTERECTOMY     breast cancer  . ANKLE RECONSTRUCTION Right 1989  . BREAST SURGERY    . KNEE SURGERY Right 1989   bone spur  . SINUSOTOMY      Current Outpatient Medications on File Prior to Visit  Medication Sig Dispense Refill  . albuterol (PROVENTIL HFA;VENTOLIN HFA) 108 (90 Base) MCG/ACT inhaler Inhale 1 puff into the lungs every 4 (four) hours as needed for wheezing or shortness of breath. 18 g 1  . Cholecalciferol (VITAMIN D3) 5000 units TABS Take by mouth every other day.     . escitalopram (LEXAPRO) 20 MG tablet Take 1 tablet (20 mg total) by mouth daily. 90 tablet 3  . ibuprofen (ADVIL,MOTRIN) 800 MG tablet     . rizatriptan (MAXALT-MLT) 10 MG disintegrating tablet Take 10 mg by mouth as needed for migraine. May repeat in 2 hours if needed    . tamoxifen  (NOLVADEX) 20 MG tablet     . topiramate (TOPAMAX) 50 MG tablet Take 1 tablet (50 mg total) by mouth 2 (two) times daily. 180 tablet 3  . naproxen sodium (ANAPROX) 220 MG tablet Take by mouth.     No current facility-administered medications on file prior to visit.     Social History   Socioeconomic History  . Marital status: Single    Spouse name: Not on file  . Number of children: 1  . Years of education: 24  . Highest education level: Not on file  Social Needs  . Financial resource strain: Not on file  . Food insecurity - worry: Not on file  . Food insecurity - inability: Not on file  . Transportation needs - medical: Not on file  . Transportation needs - non-medical: Not on file  Occupational History  . Occupation: disabled  Tobacco Use  . Smoking status: Never Smoker  . Smokeless tobacco: Never Used  Substance and Sexual Activity  . Alcohol use: No  . Drug use: No  . Sexual activity: Not Currently  Other Topics Concern  . Not on file  Social History Narrative   Bachelors degree   Accounting   Lives with daughter [name] who  has autism and diabetes   Likes to sew, quilt, crafts, crochet    Family History  Problem Relation Age of Onset  . Arthritis Mother   . COPD Mother   . Depression Mother   . Diabetes Mother   . Hyperlipidemia Mother   . Kidney disease Father   . Heart disease Father 101  . Drug abuse Father   . Hypertension Father   . Diabetes Daughter   . Hashimoto's thyroiditis Daughter   . Irritable bowel syndrome Daughter   . Autism spectrum disorder Daughter   . Early death Maternal Grandmother        drowned  . Early death Maternal Grandfather   . Heart disease Maternal Grandfather   . Heart disease Paternal Grandfather     BP 129/86   Pulse 97   Ht 5\' 6"  (1.676 m)   Wt 212 lb (96.2 kg)   BMI 34.22 kg/m      Objective:   Physical Exam  Constitutional: She is oriented to person, place, and time. She appears well-developed and  well-nourished.  HENT:  Head: Normocephalic and atraumatic.  Eyes: Conjunctivae and EOM are normal. Pupils are equal, round, and reactive to light.  Neck: Normal range of motion. Neck supple.  Cardiovascular: Normal rate, regular rhythm and intact distal pulses.  Pulmonary/Chest: Effort normal.  Abdominal: Soft.  Musculoskeletal: She exhibits tenderness (Right dominant wrist tender at insersion of flexor carpi ulnaris tendon, no swelling, no redness, pain to resisted flexion ulnar side of wrist, NV intact, grip decreased right secondary to pain.).  Neurological: She is alert and oriented to person, place, and time. She displays normal reflexes. No cranial nerve deficit. She exhibits normal muscle tone. Coordination normal.  Skin: Skin is warm and dry.  Psychiatric: She has a normal mood and affect. Her behavior is normal. Judgment and thought content normal.  Vitals reviewed.         Assessment & Plan:   Encounter Diagnoses  Name Primary?  . Strain of right wrist, initial encounter Yes  . Tendinitis of right wrist    She has tendinitis of the flexor carpi ulnaris tendon.  She is to take the ibuprofen tid.  She is fitted with Cock-up splint.  OT/PT is arranged.  Use Aspercreme to area tid.  Return in two weeks.  Call if any problem.  Precautions discussed.   Electronically Signed Sanjuana Kava, MD 1/29/20199:12 AM

## 2017-07-08 NOTE — Patient Instructions (Signed)
  Physical therapy has been ordered for you at [facility] [phone] is the phone number to call if you want to call to schedule. Please let us know if you do not hear anything within one week.  

## 2017-07-09 ENCOUNTER — Telehealth (HOSPITAL_COMMUNITY): Payer: Self-pay | Admitting: Physical Therapy

## 2017-07-09 ENCOUNTER — Telehealth: Payer: Self-pay | Admitting: Orthopaedic Surgery

## 2017-07-09 NOTE — Telephone Encounter (Signed)
Pt has called twice to schedule and we do not have a referral from Dr. Luna Glasgow. Called office l/m for Glenda to please send referral. Office was closed 07/09/17 @ 1pm when I called. NF 07/09/17

## 2017-07-09 NOTE — Telephone Encounter (Signed)
Nancy from PT did not receive order on this patient.  Please send to them  Thanks

## 2017-07-10 ENCOUNTER — Other Ambulatory Visit: Payer: Self-pay | Admitting: Radiology

## 2017-07-10 DIAGNOSIS — S66911A Strain of unspecified muscle, fascia and tendon at wrist and hand level, right hand, initial encounter: Secondary | ICD-10-CM

## 2017-07-10 DIAGNOSIS — M778 Other enthesopathies, not elsewhere classified: Secondary | ICD-10-CM

## 2017-07-10 DIAGNOSIS — IMO0001 Reserved for inherently not codable concepts without codable children: Secondary | ICD-10-CM

## 2017-07-10 NOTE — Telephone Encounter (Signed)
Dr. Luna Glasgow stated in his last note the therapy had been arranged.  This did not happen at the time of her appointment so I went ahead and sent the order to [facility].  I called the patient and let her know that it had been sent.

## 2017-07-15 ENCOUNTER — Other Ambulatory Visit: Payer: Self-pay

## 2017-07-15 ENCOUNTER — Ambulatory Visit (HOSPITAL_COMMUNITY): Payer: Managed Care, Other (non HMO) | Attending: Orthopaedic Surgery

## 2017-07-15 DIAGNOSIS — M25631 Stiffness of right wrist, not elsewhere classified: Secondary | ICD-10-CM | POA: Diagnosis present

## 2017-07-15 DIAGNOSIS — M25531 Pain in right wrist: Secondary | ICD-10-CM | POA: Insufficient documentation

## 2017-07-15 DIAGNOSIS — R29898 Other symptoms and signs involving the musculoskeletal system: Secondary | ICD-10-CM | POA: Diagnosis present

## 2017-07-15 NOTE — Patient Instructions (Signed)
WRIST FLEXOR STRETCH  Use your unaffected hand to bend the affected wrist up as shown.   Keep the elbow straight on the affected side the entire time.   Hold for 15-30 seconds. Complete 2 times.    WRIST EXTENSION STRETCH - TABLE  Place boths hand on a table as shown and gently lean forward until a stretch is felt.  Hold for 15-30 seconds. Complete 2 times.    WRIST ULNAR DEVIATION  Bend your wrist towards the little finger side and then return.   Complete 10-12 times. 1 set    Home Exercises Program Theraputty Exercises  Do the following exercises 2-3 times a day using your affected hand.  1. Roll putty into a ball.  2. Make into a pancake.  3. Roll putty into a roll.  4. Pinch along log with first finger and thumb.   5. Make into a ball.  6. Roll it back into a log.   7. Pinch using thumb and side of first finger.  8. Roll into a ball, then flatten into a pancake.  9. Using your fingers, make putty into a mountain.  10. Grip and release 10 times.

## 2017-07-15 NOTE — Therapy (Signed)
[place], [place], [address] Phone: [phone]   Fax:  [phone]  Occupational Therapy Evaluation  Patient Details  Name: Pamela Long MRN: [id number] Date of Birth: 1971-03-15 Referring Provider: Sanjuana Kava, MD   Encounter Date: 07/15/2017  OT End of Session - 07/15/17 1120    Visit Number  1    Number of Visits  8    Date for OT Re-Evaluation  08/14/17    Authorization Type  [employer] managed     Authorization Time Period  Requires approval for additional OT at 26th visit    OT Start Time  0950    OT Stop Time  1030    OT Time Calculation (min)  40 min    Activity Tolerance  Patient tolerated treatment well    Behavior During Therapy  [facility] for tasks assessed/performed       Past Medical History:  Diagnosis Date  . Allergy   . Anemia   . Anxiety   . Asthma   . Cancer ([place])    breast  . Depression   . GERD (gastroesophageal reflux disease)   . HLD (hyperlipidemia) 02/26/2017    Past Surgical History:  Procedure Laterality Date  . ABDOMINAL HYSTERECTOMY     breast cancer  . ANKLE RECONSTRUCTION Right 1989  . BREAST SURGERY    . KNEE SURGERY Right 1989   bone spur  . SINUSOTOMY      There were no vitals filed for this visit.  Subjective Assessment - 07/15/17 0959    Subjective   S: I fell while running. And I didn't even fall onto this hand. I fell flat.    Pertinent History  Patient is a 47 y/o female S/P right wrist tendonitis which was caused from a mechanical fall on 06/20/17. Pt reports that X-rays were performed and no fracture was seen. Dr. Luna Glasgow has referred patient to occupational therapy for evaluation and treatment.     Patient Stated Goals  To be pain free and be able to use my right hand normally.    Currently in Pain?  Yes can get to 6-7/10 when aggrevated    Pain Score  1     Pain Location  Wrist    Pain Orientation  Right    Pain Descriptors / Indicators  Aching;Throbbing     Pain Type  Acute pain    Pain Radiating Towards  N/A    Pain Onset  1 to 4 weeks ago    Pain Frequency  Occasional    Aggravating Factors   Increased use and movement    Pain Relieving Factors  rest, pain medication    Effect of Pain on Daily Activities  severe effect    Multiple Pain Sites  No        [facility] OT Assessment - 07/15/17 0956      Assessment   Medical Diagnosis  right wrist tendonitis    Referring Provider  Sanjuana Kava, MD    Onset Date/Surgical Date  06/20/17    Hand Dominance  Right    Next MD Visit  07/22/17    Prior Therapy  None for this condition.      Precautions   Precautions  None    Required Braces or Orthoses  Other Brace/Splint    Other Brace/Splint  Pt is wearing a wrist cock-up splint supplied by MD.      Restrictions   Weight Bearing Restrictions  No  Balance Screen   Has the patient fallen in the past 6 months  Yes    How many times?  1    Has the patient had a decrease in activity level because of a fear of falling?   No    Is the patient reluctant to leave their home because of a fear of falling?   No      Home  Environment   Family/patient expects to be discharged to:  Private residence    Lives With  Daughter 34 y/o daughter      Prior Function   Level of Independence  Independent    Vocation  On disability    Leisure  Crocheting      ADL   ADL comments  Difficulty completing crocheting tasks, gripping and holding items, Unable to cut food and stirring.      Written Expression   Dominant Hand  Right      Vision - History   Baseline Vision  Wears glasses all the time      Cognition   Overall Cognitive Status  Within Functional Limits for tasks assessed      Coordination   9 Hole Peg Test  Left;Right    Left 9 Hole Peg Test  19.7"    Box and Blocks  21.2"      Edema   Edema  Slight swelling noted along the ulna region of her forearm proximal to the wrist.       ROM / Strength   AROM / PROM / Strength  AROM;Strength       Palpation   Palpation comment  Min fascial restrictions noted along the ulna region of her right forearm proximal to her wrist as well as along carpal tunnel area.       AROM   AROM Assessment Site  Wrist    Right/Left Wrist  Right    Right Wrist Extension  42 Degrees    Right Wrist Flexion  60 Degrees    Right Wrist Ulnar Deviation  24 Degrees      Strength   Strength Assessment Site  Wrist;Hand    Right/Left Wrist  Right    Right Wrist Flexion  4-/5    Right Wrist Extension  4+/5    Right/Left hand  Right;Left    Right Hand Grip (lbs)  30    Right Hand Lateral Pinch  16 lbs    Right Hand 3 Point Pinch  12 lbs    Left Hand Grip (lbs)  70    Left Hand Lateral Pinch  18 lbs    Left Hand 3 Point Pinch  16 lbs               OT Treatments/Exercises (OP) - 07/15/17 0001      Modalities   Modalities  Cryotherapy      Cryotherapy   Number Minutes Cryotherapy  1 Minutes    Cryotherapy Location  Wrist    Type of Cryotherapy  Ice massage            OT Education - 07/15/17 1119    Education provided  Yes    Education Details  wrist extension stretch, yellow theraputty exercies and ulnar deviation A/ROM exercises.     Person(s) Educated  Patient    Methods  Explanation;Demonstration;Handout    Comprehension  Verbalized understanding;Returned demonstration       OT Short Term Goals - 07/15/17 1143      OT SHORT TERM GOAL #  1   Title  Patient will return to highest level of independence using her right hand as her dominant extremity for all daily and leisure tasks.     Time  4    Period  Weeks    Status  New    Target Date  08/14/17      OT SHORT TERM GOAL #2   Title  Patient will be educated and independent with HEP to increase functional use of right hand during daily tasks.     Time  4    Period  Weeks    Status  New      OT SHORT TERM GOAL #3   Title  Patient will increase A/ROM of right wrist to WNL to increase ability complete crocheting tasks  with increased comfort.     Time  4    Period  Weeks    Status  New      OT SHORT TERM GOAL #4   Title  Patient will increase grip strength by 10# and pinch strength by 5# in order to be able to be able to hold onto items without dropping.     Time  4    Period  Weeks    Status  New      OT SHORT TERM GOAL #5   Title  Patient will report a decrease in pain level while performing tasks such as cutting her own meat about 75% of the time.    Time  4    Period  Weeks    Status  New               Plan - 07/15/17 1122    Clinical Impression Statement  A: Patient is a 47 y/o female S/P right wrist tendonitis causing increased pain , swelling, fascial restrictions and decreased  ROM and strength resulting in difficulty completing daily tasks while using right hand as dominant.     Occupational Profile and client history currently impacting functional performance  motiviated to return to prior level of function.    Occupational performance deficits (Please refer to evaluation for details):  ADL's;Leisure;IADL's    Rehab Potential  Excellent    Current Impairments/barriers affecting progress:  History of cancer    OT Frequency  2x / week    OT Duration  4 weeks    OT Treatment/Interventions  Self-care/ADL training;Ultrasound;Patient/family education;DME and/or AE instruction;Passive range of motion;Paraffin;Cryotherapy;Electrical Stimulation;Moist Heat;Therapeutic exercise;Therapeutic activities;Manual Therapy    Plan  P: Patient will benefit from skilled OT services to increase functional performance while using RUE for daily tasks as dominant extremity. Treatment Plan: myofascial release, manual stretching, A/ROM, and wrist strengthening. Grip and pinch strengthening.    Clinical Decision Making  Limited treatment options, no task modification necessary    Consulted and Agree with Plan of Care  Patient       Patient will benefit from skilled therapeutic intervention in order to  improve the following deficits and impairments:  Pain, Increased edema, Decreased range of motion, Increased fascial restrictions, Impaired UE functional use, Decreased strength  Visit Diagnosis: Pain in right wrist - Plan: Ot plan of care cert/re-cert  Other symptoms and signs involving the musculoskeletal system - Plan: Ot plan of care cert/re-cert  Stiffness of right wrist, not elsewhere classified - Plan: Ot plan of care cert/re-cert    Problem List Patient Active Problem List   Diagnosis Date Noted  . Vitamin D deficiency 02/26/2017  . Prediabetes 02/26/2017  . HLD (hyperlipidemia)  02/26/2017  . Asthma 02/12/2017  . Depression 02/12/2017  . Migraines 02/12/2017  . Arthralgia 02/12/2017  . Sleep-disordered breathing 02/12/2017  . Positive ANA (antinuclear antibody) 02/12/2017  . Angular cheilitis 02/12/2017  . Obesity (BMI 35.0-39.9 without comorbidity) 02/12/2017  . Malignant neoplasm of central portion of left breast in female, estrogen receptor positive [facility]) 01/05/2017   Ailene Ravel, OTR/L,CBIS  [phone]  07/15/2017, 11:48 AM  [place] [address] [place], [place], [address] Phone: [phone]   Fax:  [phone]  Name: Pamela Long MRN: [id number] Date of Birth: [birth year]

## 2017-07-22 ENCOUNTER — Ambulatory Visit: Payer: Managed Care, Other (non HMO) | Admitting: Orthopaedic Surgery

## 2017-07-22 ENCOUNTER — Encounter: Payer: Self-pay | Admitting: Orthopaedic Surgery

## 2017-07-22 VITALS — BP 108/73 | HR 95 | Temp 97.4°F | Ht 65.0 in | Wt 215.0 lb

## 2017-07-22 DIAGNOSIS — M778 Other enthesopathies, not elsewhere classified: Secondary | ICD-10-CM | POA: Diagnosis not present

## 2017-07-22 DIAGNOSIS — S66911A Strain of unspecified muscle, fascia and tendon at wrist and hand level, right hand, initial encounter: Secondary | ICD-10-CM | POA: Diagnosis not present

## 2017-07-22 NOTE — Progress Notes (Signed)
Patient Pamela Long, female DOB:[birth year], 47 y.o. QMG:[id number]  Chief Complaint  Patient presents with  . Follow-up    Recheck on right wrist after PT.    HPI  Pamela Long is a 47 y.o. female who has pain of the right wrist.  She has been to OT for evaluation.  She is doing her stretching exercises.  She still has pain.  She loves to knit and is unable to do it.  She has no numbness. HPI  Body mass index is 35.78 kg/m.  ROS  Review of Systems  Respiratory: Positive for shortness of breath.   Musculoskeletal: Positive for arthralgias.  Psychiatric/Behavioral: The patient is nervous/anxious.   All other systems reviewed and are negative.   Past Medical History:  Diagnosis Date  . Allergy   . Anemia   . Anxiety   . Asthma   . Cancer ([place])    breast  . Depression   . GERD (gastroesophageal reflux disease)   . HLD (hyperlipidemia) 02/26/2017    Past Surgical History:  Procedure Laterality Date  . ABDOMINAL HYSTERECTOMY     breast cancer  . ANKLE RECONSTRUCTION Right 1989  . BREAST SURGERY    . KNEE SURGERY Right 1989   bone spur  . SINUSOTOMY      Family History  Problem Relation Age of Onset  . Arthritis Mother   . COPD Mother   . Depression Mother   . Diabetes Mother   . Hyperlipidemia Mother   . Kidney disease Father   . Heart disease Father 49  . Drug abuse Father   . Hypertension Father   . Diabetes Daughter   . Hashimoto's thyroiditis Daughter   . Irritable bowel syndrome Daughter   . Autism spectrum disorder Daughter   . Early death Maternal Grandmother        drowned  . Early death Maternal Grandfather   . Heart disease Maternal Grandfather   . Heart disease Paternal Grandfather     Social History Social History   Tobacco Use  . Smoking status: Never Smoker  . Smokeless tobacco: Never Used  Substance Use Topics  . Alcohol use: No  . Drug use: No    Allergies  Allergen Reactions  . Amoxicillin Hives    Rash only  .  Caffeine Diarrhea, Nausea Only and Palpitations    Other reaction(s): Headache  . Tetanus Toxoids Swelling    Local reaction    Current Outpatient Medications  Medication Sig Dispense Refill  . albuterol (PROVENTIL HFA;VENTOLIN HFA) 108 (90 Base) MCG/ACT inhaler Inhale 1 puff into the lungs every 4 (four) hours as needed for wheezing or shortness of breath. 18 g 1  . Cholecalciferol (VITAMIN D3) 5000 units TABS Take by mouth every other day.     . escitalopram (LEXAPRO) 20 MG tablet Take 1 tablet (20 mg total) by mouth daily. 90 tablet 3  . ibuprofen (ADVIL,MOTRIN) 800 MG tablet     . naproxen sodium (ANAPROX) 220 MG tablet Take by mouth.    . rizatriptan (MAXALT-MLT) 10 MG disintegrating tablet Take 10 mg by mouth as needed for migraine. May repeat in 2 hours if needed    . tamoxifen (NOLVADEX) 20 MG tablet     . topiramate (TOPAMAX) 50 MG tablet Take 1 tablet (50 mg total) by mouth 2 (two) times daily. 180 tablet 3   No current facility-administered medications for this visit.      Physical Exam  Blood pressure 108/73, pulse 95, temperature Marland Kitchen)  97.4 F (36.3 C), height 5\' 5"  (1.651 m), weight 215 lb (97.5 kg).  Constitutional: overall normal hygiene, normal nutrition, well developed, normal grooming, normal body habitus. Assistive device:none  Musculoskeletal: gait and station Limp none, muscle tone and strength are normal, no tremors or atrophy is present.  .  Neurological: coordination overall normal.  Deep tendon reflex/nerve stretch intact.  Sensation normal.  Cranial nerves II-XII intact.   Skin:   Normal overall no scars, lesions, ulcers or rashes. No psoriasis.  Psychiatric: Alert and oriented x 3.  Recent memory intact, remote memory unclear.  Normal mood and affect. Well groomed.  Good eye contact.  Cardiovascular: overall no swelling, no varicosities, no edema bilaterally, normal temperatures of the legs and arms, no clubbing, cyanosis and good capillary  refill.  Right wrist has full motion but tender.  NV intact.  Lymphatic: palpation is normal.  All other systems reviewed and are negative   The patient has been educated about the nature of the problem(s) and counseled on treatment options.  The patient appeared to understand what I have discussed and is in agreement with it.  Encounter Diagnoses  Name Primary?  . Strain of right wrist, initial encounter Yes  . Tendinitis of right wrist     PLAN Call if any problems.  Precautions discussed.  Continue current medications.   Return to clinic 2 weeks   Continue OT.  Electronically Signed Sanjuana Kava, MD 2/12/201910:13 AM

## 2017-07-23 ENCOUNTER — Ambulatory Visit (HOSPITAL_COMMUNITY): Payer: Managed Care, Other (non HMO)

## 2017-07-25 ENCOUNTER — Telehealth: Payer: Self-pay

## 2017-07-25 ENCOUNTER — Encounter (HOSPITAL_COMMUNITY): Payer: Self-pay

## 2017-07-25 ENCOUNTER — Other Ambulatory Visit: Payer: Self-pay

## 2017-07-25 ENCOUNTER — Ambulatory Visit (HOSPITAL_COMMUNITY): Payer: Managed Care, Other (non HMO)

## 2017-07-25 DIAGNOSIS — M25531 Pain in right wrist: Secondary | ICD-10-CM

## 2017-07-25 DIAGNOSIS — R29898 Other symptoms and signs involving the musculoskeletal system: Secondary | ICD-10-CM

## 2017-07-25 DIAGNOSIS — M25631 Stiffness of right wrist, not elsewhere classified: Secondary | ICD-10-CM

## 2017-07-25 NOTE — Telephone Encounter (Signed)
Can see her my next available appointment.  No promises I will remove this, I will just evaluate

## 2017-07-25 NOTE — Therapy (Addendum)
[place], [place], [address] Phone: [phone]   Fax:  [phone]  Occupational Therapy Treatment  Patient Details  Name: Pamela Long MRN: [id number] Date of Birth: 1970/08/10 Referring Provider: Sanjuana Kava, MD   Encounter Date: 07/25/2017  OT End of Session - 07/25/17 1019    Visit Number  2    Number of Visits  8    Date for OT Re-Evaluation  08/14/17    Authorization Type  [employer] managed     Authorization Time Period  Requires approval for additional OT at 26th visit    OT Start Time  0900    OT Stop Time  0945    OT Time Calculation (min)  45 min    Activity Tolerance  Patient tolerated treatment well    Behavior During Therapy  [facility] for tasks assessed/performed       Past Medical History:  Diagnosis Date  . Allergy   . Anemia   . Anxiety   . Asthma   . Cancer ([place])    breast  . Depression   . GERD (gastroesophageal reflux disease)   . HLD (hyperlipidemia) 02/26/2017    Past Surgical History:  Procedure Laterality Date  . ABDOMINAL HYSTERECTOMY     breast cancer  . ANKLE RECONSTRUCTION Right 1989  . BREAST SURGERY    . KNEE SURGERY Right 1989   bone spur  . SINUSOTOMY      There were no vitals filed for this visit.  Subjective Assessment - 07/25/17 0915    Subjective   S: The next day after my visit here I woke up and it felt great!    Currently in Pain?  Yes    Pain Score  1     Pain Location  Wrist    Pain Orientation  Right    Pain Descriptors / Indicators  Aching;Throbbing    Pain Type  Acute pain    Pain Radiating Towards  N/A    Pain Onset  1 to 4 weeks ago    Pain Frequency  Occasional    Aggravating Factors   increased use and movement    Pain Relieving Factors  rest, pain medication    Effect of Pain on Daily Activities  mod effect    Multiple Pain Sites  No         [facility] OT Assessment - 07/25/17 0917      Assessment   Medical Diagnosis  right wrist  tendonitis      Precautions   Precautions  None               OT Treatments/Exercises (OP) - 07/25/17 0917      Exercises   Exercises  Wrist;Hand;Theraputty;Elbow      Elbow Exercises   Forearm Supination  Strengthening;10 reps    Bar Weights/Barbell (Forearm Supination)  3 lbs    Forearm Pronation  Strengthening;10 reps    Bar Weights/Barbell (Forearm Pronation)  3 lbs      Additional Elbow Exercises   Theraputty - Flatten  yellow    Theraputty - Roll  yellow    Theraputty - Grip  yellow      Wrist Exercises   Wrist Flexion  PROM;Strengthening;10 reps    Bar Weights/Barbell (Wrist Flexion)  3 lbs    Wrist Extension  PROM;Strengthening;10 reps    Bar Weights/Barbell (Wrist Extension)  3 lbs    Wrist Radial Deviation  PROM;Strengthening;10 reps  Bar Weights/Barbell (Radial Deviation)  3 lbs    Wrist Ulnar Deviation  PROM;Strengthening;10 reps    Bar Weights/Barbell (Ulnar Deviation)  3 lbs      Additional Wrist Exercises   Sponges  23, 21      Hand Exercises   Other Hand Exercises  Blue resistive clothespin used with 3 point pinch to pick up 30 sponges.      Theraputty   Theraputty - Pinch  yellow - 3 point/lateral      Manual Therapy   Manual Therapy  Myofascial release    Manual therapy comments  Manual therapy completed prior to exercises.    Myofascial Release  Myofascial release and manual stretching completed to              OT Education - 07/25/17 1009    Education provided  Yes    Education Details  OT evaluation handout. Reviews goals and plan of care    Person(s) Educated  Patient    Methods  Explanation;Handout    Comprehension  Verbalized understanding       OT Short Term Goals - 07/25/17 0102      OT SHORT TERM GOAL #1   Title  Patient will return to highest level of independence using her right hand as her dominant extremity for all daily and leisure tasks.     Time  4    Period  Weeks    Status  On-going      OT SHORT  TERM GOAL #2   Title  Patient will be educated and independent with HEP to increase functional use of right hand during daily tasks.     Time  4    Period  Weeks    Status  On-going      OT SHORT TERM GOAL #3   Title  Patient will increase A/ROM of right wrist to WNL to increase ability complete crocheting tasks with increased comfort.     Time  4    Period  Weeks    Status  On-going      OT SHORT TERM GOAL #4   Title  Patient will increase grip strength by 10# and pinch strength by 5# in order to be able to be able to hold onto items without dropping.     Time  4    Period  Weeks    Status  On-going      OT SHORT TERM GOAL #5   Title  Patient will report a decrease in pain level while performing tasks such as cutting her own meat about 75% of the time.    Time  4    Period  Weeks    Status  On-going               Plan - 07/25/17 1025    Clinical Impression Statement  A: Initiated myofascial release, manual stretching, grip and pinch strengthening this session. patient reports that she felt great the day after the evaluation. She did become sore again after that. She is no longer wearing the brace as it was giving her more pain. VC for form and technique needed during session.    Plan  P: Add handgripper with beads next session for grip strength.       Patient will benefit from skilled therapeutic intervention in order to improve the following deficits and impairments:  Pain, Increased edema, Decreased range of motion, Increased fascial restrictions, Impaired UE functional use, Decreased strength  Visit Diagnosis:  Pain in right wrist  Other symptoms and signs involving the musculoskeletal system  Stiffness of right wrist, not elsewhere classified    Problem List Patient Active Problem List   Diagnosis Date Noted  . Vitamin D deficiency 02/26/2017  . Prediabetes 02/26/2017  . HLD (hyperlipidemia) 02/26/2017  . Asthma 02/12/2017  . Depression 02/12/2017  .  Migraines 02/12/2017  . Arthralgia 02/12/2017  . Sleep-disordered breathing 02/12/2017  . Positive ANA (antinuclear antibody) 02/12/2017  . Angular cheilitis 02/12/2017  . Obesity (BMI 35.0-39.9 without comorbidity) 02/12/2017  . Malignant neoplasm of central portion of left breast in female, estrogen receptor positive ([place]) 01/05/2017    OCCUPATIONAL THERAPY DISCHARGE SUMMARY  Visits from Start of Care: 2  Current functional level related to goals / functional outcomes: Patient called to request to be discharged. She states that her wrist is better. She has returned to work and her job does not allow her to make it to her therapy appointments.    Plan: Patient agrees to discharge.  Patient goals were not met. Patient is being discharged due to the patient's request.  ?????        Ailene Ravel, OTR/L,CBIS  [phone]  07/25/2017, 10:33 AM  [place] [address] [place], [place], [address] Phone: [phone]   Fax:  [phone]  Name: Pamela Long MRN: [id number] Date of Birth: 1971-01-23

## 2017-07-25 NOTE — Telephone Encounter (Signed)
Called and spoke to Pamela Long. She had a fall in January, states she still has "something" under the skin top left lip. She states it seems to be just under the skin?  Would you like to see her or refer her?

## 2017-07-28 ENCOUNTER — Ambulatory Visit (HOSPITAL_COMMUNITY): Payer: Managed Care, Other (non HMO)

## 2017-07-30 ENCOUNTER — Ambulatory Visit (HOSPITAL_COMMUNITY): Payer: Managed Care, Other (non HMO) | Admitting: Occupational Therapy

## 2017-07-30 ENCOUNTER — Telehealth (HOSPITAL_COMMUNITY): Payer: Self-pay | Admitting: Occupational Therapy

## 2017-07-30 NOTE — Telephone Encounter (Signed)
She has a migrain Headache and can not make this apptment today

## 2017-07-31 ENCOUNTER — Encounter: Payer: Self-pay | Admitting: Family Medicine

## 2017-07-31 ENCOUNTER — Other Ambulatory Visit: Payer: Self-pay

## 2017-07-31 ENCOUNTER — Ambulatory Visit (INDEPENDENT_AMBULATORY_CARE_PROVIDER_SITE_OTHER): Payer: Managed Care, Other (non HMO) | Admitting: Family Medicine

## 2017-07-31 VITALS — BP 130/86 | HR 100 | Temp 97.7°F | Resp 18 | Ht 65.0 in | Wt 220.0 lb

## 2017-07-31 DIAGNOSIS — N3281 Overactive bladder: Secondary | ICD-10-CM

## 2017-07-31 HISTORY — DX: Overactive bladder: N32.81

## 2017-07-31 MED ORDER — TOLTERODINE TARTRATE ER 2 MG PO CP24: 2.0000 mg | ORAL_CAPSULE | Freq: Every day | ORAL | 2 refills | Status: DC

## 2017-07-31 NOTE — Progress Notes (Signed)
Chief Complaint  Patient presents with  . lump top left lip   Patient came in because she has a lump on her lip that she thinks is a piece of gravel.  She wants me to remove it.  She did have a fall where she landed on her face on the street.  This was about a month ago.  She was referred by her dentist to a Psychiatric nurse.  She has an appointment in the upcoming weeks.  She states that she "cannot stand it" and "cannot wait that long" Second complaint is overactive bladder.  She does not have incontinence.  She has to go frequently.  Since she realizes she has to urinate, she has to hurry and feels urgency.  No dysuria no hematuria and no fever or chills to suggest infection.  Patient Active Problem List   Diagnosis Date Noted  . OAB (overactive bladder) 07/31/2017  . Vitamin D deficiency 02/26/2017  . Prediabetes 02/26/2017  . HLD (hyperlipidemia) 02/26/2017  . Asthma 02/12/2017  . Depression 02/12/2017  . Migraines 02/12/2017  . Arthralgia 02/12/2017  . Sleep-disordered breathing 02/12/2017  . Positive ANA (antinuclear antibody) 02/12/2017  . Angular cheilitis 02/12/2017  . Obesity (BMI 35.0-39.9 without comorbidity) 02/12/2017  . Malignant neoplasm of central portion of left breast in female, estrogen receptor positive ([place]) 01/05/2017    Outpatient Encounter Medications as of 07/31/2017  Medication Sig  . albuterol (PROVENTIL HFA;VENTOLIN HFA) 108 (90 Base) MCG/ACT inhaler Inhale 1 puff into the lungs every 4 (four) hours as needed for wheezing or shortness of breath.  . Cholecalciferol (VITAMIN D3) 5000 units TABS Take by mouth every other day.   . escitalopram (LEXAPRO) 20 MG tablet Take 1 tablet (20 mg total) by mouth daily.  Marland Kitchen ibuprofen (ADVIL,MOTRIN) 800 MG tablet   . naproxen sodium (ANAPROX) 220 MG tablet Take by mouth.  . rizatriptan (MAXALT-MLT) 10 MG disintegrating tablet Take 10 mg by mouth as needed for migraine. May repeat in 2 hours if needed  . tamoxifen  (NOLVADEX) 20 MG tablet   . topiramate (TOPAMAX) 50 MG tablet Take 1 tablet (50 mg total) by mouth 2 (two) times daily.  Marland Kitchen tolterodine (DETROL LA) 2 MG 24 hr capsule Take 1 capsule (2 mg total) by mouth daily.   No facility-administered encounter medications on file as of 07/31/2017.     Allergies  Allergen Reactions  . Amoxicillin Hives    Rash only  . Caffeine Diarrhea, Nausea Only and Palpitations    Other reaction(s): Headache  . Tetanus Toxoids Swelling    Local reaction    Review of Systems  Constitutional: Positive for unexpected weight change. Negative for activity change and appetite change.       Weight gain  HENT: Positive for dental problem. Negative for congestion.         fall with dental fracture, repaired.  Eyes: Negative for photophobia and visual disturbance.  Respiratory: Negative for cough and choking.   Cardiovascular: Negative for chest pain and palpitations.  Gastrointestinal: Negative for constipation and diarrhea.  Genitourinary: Positive for frequency. Negative for difficulty urinating and flank pain.  Musculoskeletal: Positive for arthralgias and gait problem. Negative for neck pain and neck stiffness.  Skin: Negative for wound.       Bump in the  lip  Psychiatric/Behavioral: Positive for confusion and decreased concentration.       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    BP 130/86 (BP Location:  Right Arm, Patient Position: Sitting, Cuff Size: Normal)   Pulse 100   Temp 97.7 F (36.5 C) (Temporal)   Resp 18   Ht 5\' 5"  (1.651 m)   Wt 220 lb (99.8 kg)   SpO2 99%   BMI 36.61 kg/m   Physical Exam  Constitutional: She is oriented to person, place, and time. She appears well-developed and well-nourished. No distress.  HENT:  Head: Normocephalic and atraumatic.  Right Ear: External ear normal.  Left Ear: External ear normal.  Nose: Nose normal.  Mouth/Throat: Oropharynx is clear and moist.    Cardiovascular:  Normal rate, regular rhythm and normal heart sounds.  Pulmonary/Chest: Effort normal and breath sounds normal.  Neurological: She is alert and oriented to person, place, and time.  Psychiatric: She has a normal mood and affect. Her behavior is normal.   Procedure; At patient's request the lip was anesthetized with a 1/2 cc lidocaine we will and I attempted to puncture the skin with an 18-gauge needle to see if the gravel/foreign body would come out.  Even after waiting a few minutes, re-anesthetizing, I could never get her lip numb.  I aborted the procedure and told her to go to the plastic surgeon.  She told me that she has difficulty with anesthesia with lidocaine ASSESSMENT/PLAN:  1. OAB (overactive bladder) Risks benefits and side effects discussed.   Patient Instructions  Take the detrol daily If after one week no  Improvement take 2 a day Call me if you need to increase dose so I can let pharmacy know  See me in April      Raylene Everts, MD

## 2017-07-31 NOTE — Patient Instructions (Addendum)
Take the detrol daily If after one week no  Improvement take 2 a day Call me if you need to increase dose so I can let pharmacy know  See me in April

## 2017-08-01 ENCOUNTER — Encounter (HOSPITAL_COMMUNITY): Payer: Managed Care, Other (non HMO)

## 2017-08-01 DIAGNOSIS — Z9012 Acquired absence of left breast and nipple: Secondary | ICD-10-CM | POA: Insufficient documentation

## 2017-08-04 ENCOUNTER — Encounter: Payer: Self-pay | Admitting: Orthopaedic Surgery

## 2017-08-04 ENCOUNTER — Telehealth (HOSPITAL_COMMUNITY): Payer: Self-pay

## 2017-08-04 ENCOUNTER — Encounter (HOSPITAL_COMMUNITY): Payer: Managed Care, Other (non HMO) | Admitting: Specialist

## 2017-08-04 NOTE — Telephone Encounter (Signed)
Pt requested to be d/c; her arm is better and her job will not allow her to complete the future appointments.

## 2017-08-06 ENCOUNTER — Ambulatory Visit: Payer: Managed Care, Other (non HMO) | Admitting: Orthopaedic Surgery

## 2017-08-06 ENCOUNTER — Encounter (HOSPITAL_COMMUNITY): Payer: Managed Care, Other (non HMO)

## 2017-08-07 ENCOUNTER — Encounter (HOSPITAL_COMMUNITY): Payer: Managed Care, Other (non HMO) | Admitting: Occupational Therapy

## 2017-08-11 ENCOUNTER — Encounter (HOSPITAL_COMMUNITY): Payer: Managed Care, Other (non HMO)

## 2017-08-13 ENCOUNTER — Encounter (HOSPITAL_COMMUNITY): Payer: Managed Care, Other (non HMO)

## 2017-08-13 ENCOUNTER — Encounter (HOSPITAL_COMMUNITY): Payer: Managed Care, Other (non HMO) | Admitting: Occupational Therapy

## 2017-08-18 ENCOUNTER — Encounter: Payer: Self-pay | Admitting: Family Medicine

## 2017-08-25 ENCOUNTER — Other Ambulatory Visit: Payer: Self-pay

## 2017-08-25 ENCOUNTER — Encounter (HOSPITAL_BASED_OUTPATIENT_CLINIC_OR_DEPARTMENT_OTHER): Payer: Self-pay | Admitting: *Deleted

## 2017-08-25 NOTE — H&P (Signed)
Subjective:     Patient ID: Pamela Long is a 47 y.o. female.  HPI  Here for concern foreign body lip. In January 2019 reports running across the street and fell on outstretched hands and hit her face. She went to the emergency room at time of injury in [place]. She did have dental trauma, facial lacerations. She believes she has gravel in her lip. Her PCP attempted to numb lip 2.21.19- unable to numb area and procedure aborted. States dentist able to visualize FB on dental films.   Her history is significant for left breast ca. She underwent neoadjuvant chemotherapy followed by left lumpectomy SLN. 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 One margin positive and patient elected for bilateral mastectomies. This was completed in [place]. On tamoxifen. She has had prior reconstruction consult, scheduled for surgery and decided not to pursue at that time.  Prior to mastectomies 40 DD. Does have prosthesis, only wears during recent start part time work at [employer]. Weight up 20 lb over last years.  Genetic testing 2018 negative.  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  Review of Systems     Objective:   Physical Exam  Cardiovascular: Normal rate, regular rhythm and normal heart sounds.   Pulmonary/Chest: Effort normal and breath sounds normal.  Abdominal: Soft.  Lymphadenopathy:    She has no axillary adenopathy.   Chest: bilateral transverse scars, no masses HEENT: palpable mass left upper lip, with pressure blanches over palpable area, well healed scar vermillon    Assessment:     Lip injury Acquired absence breasts, history breast cancer    Plan:     Lip- no imaging to review.Palpable mass. States difficult to numb and requires additional sedation. Plan excision in OR under sedation. Reviewed scar itself will feel firm and take months to soften.  Provided brochure for [facility]. Discussed autologous vs  implant based reconstruction. Reviewed incisions, drains, OR length, hospital stay and recovery for each. Discussed process of expansion and implant based risks including rupture, MRI surveillance for silicone implants, infection requiring surgery or removal, contracture. Discussed TRAM vs DIEP, latter would require treatment at tertiary center. Discussed abdominal based complications including failure flap, abdominal bulge or hernia. If she desires autologous recommend consultation with DIEP surgeon. Reviewed at her current weight she is not good candidate for abdominal based reconstruction. Wt loss or stability important for implant based reconstruction but would not preclude her from staring process at present with expander placement. She plans to try wt reduction, no plans for reconstruction presently.   Irene Limbo, MD [facility] Plastic & Reconstructive Surgery [phone], pin [phone]

## 2017-08-29 ENCOUNTER — Encounter (HOSPITAL_BASED_OUTPATIENT_CLINIC_OR_DEPARTMENT_OTHER): Payer: Self-pay

## 2017-08-29 ENCOUNTER — Ambulatory Visit (HOSPITAL_BASED_OUTPATIENT_CLINIC_OR_DEPARTMENT_OTHER): Payer: Managed Care, Other (non HMO) | Admitting: Certified Registered"

## 2017-08-29 ENCOUNTER — Ambulatory Visit (HOSPITAL_BASED_OUTPATIENT_CLINIC_OR_DEPARTMENT_OTHER)
Admission: RE | Admit: 2017-08-29 | Discharge: 2017-08-29 | Disposition: A | Discharge status: Home / Self Care | Payer: Managed Care, Other (non HMO) | Source: Ambulatory Visit | Attending: Plastic Surgery | Admitting: Plastic Surgery

## 2017-08-29 ENCOUNTER — Encounter (HOSPITAL_BASED_OUTPATIENT_CLINIC_OR_DEPARTMENT_OTHER)
Admission: RE | Disposition: A | Discharge status: Home / Self Care | Payer: Self-pay | Source: Ambulatory Visit | Attending: Plastic Surgery

## 2017-08-29 ENCOUNTER — Other Ambulatory Visit: Payer: Self-pay

## 2017-08-29 DIAGNOSIS — Z1889 Other specified retained foreign body fragments: Secondary | ICD-10-CM | POA: Insufficient documentation

## 2017-08-29 DIAGNOSIS — J45909 Unspecified asthma, uncomplicated: Secondary | ICD-10-CM | POA: Insufficient documentation

## 2017-08-29 DIAGNOSIS — F329 Major depressive disorder, single episode, unspecified: Secondary | ICD-10-CM | POA: Diagnosis not present

## 2017-08-29 DIAGNOSIS — F419 Anxiety disorder, unspecified: Secondary | ICD-10-CM | POA: Insufficient documentation

## 2017-08-29 DIAGNOSIS — Z91018 Allergy to other foods: Secondary | ICD-10-CM | POA: Insufficient documentation

## 2017-08-29 DIAGNOSIS — Z887 Allergy status to serum and vaccine status: Secondary | ICD-10-CM | POA: Diagnosis not present

## 2017-08-29 DIAGNOSIS — K219 Gastro-esophageal reflux disease without esophagitis: Secondary | ICD-10-CM | POA: Insufficient documentation

## 2017-08-29 DIAGNOSIS — M6028 Foreign body granuloma of soft tissue, not elsewhere classified, other site: Secondary | ICD-10-CM | POA: Diagnosis not present

## 2017-08-29 DIAGNOSIS — Z880 Allergy status to penicillin: Secondary | ICD-10-CM | POA: Insufficient documentation

## 2017-08-29 DIAGNOSIS — M795 Residual foreign body in soft tissue: Secondary | ICD-10-CM | POA: Diagnosis present

## 2017-08-29 DIAGNOSIS — Z79899 Other long term (current) drug therapy: Secondary | ICD-10-CM | POA: Diagnosis not present

## 2017-08-29 HISTORY — PX: FOREIGN BODY REMOVAL: SHX962

## 2017-08-29 HISTORY — DX: Other specified postprocedural states: R11.2

## 2017-08-29 HISTORY — DX: Other specified postprocedural states: Z98.890

## 2017-08-29 SURGERY — FOREIGN BODY REMOVAL ADULT
Anesthesia: Monitor Anesthesia Care | Site: Mouth

## 2017-08-29 MED ORDER — LACTATED RINGERS IV SOLN
INTRAVENOUS | Status: DC
  Administered 2017-08-29: 07:00:00 via INTRAVENOUS

## 2017-08-29 MED ORDER — BUPIVACAINE HCL (PF) 0.25 % IJ SOLN
INTRAMUSCULAR | Status: AC
  Filled 2017-08-29: qty 30

## 2017-08-29 MED ORDER — BUPIVACAINE-EPINEPHRINE (PF) 0.5% -1:200000 IJ SOLN
INTRAMUSCULAR | Status: AC
  Filled 2017-08-29: qty 60

## 2017-08-29 MED ORDER — PROPOFOL 500 MG/50ML IV EMUL
INTRAVENOUS | Status: AC
  Filled 2017-08-29: qty 50

## 2017-08-29 MED ORDER — ONDANSETRON HCL 4 MG/2ML IJ SOLN
INTRAMUSCULAR | Status: DC | PRN
  Administered 2017-08-29: 4 mg via INTRAVENOUS

## 2017-08-29 MED ORDER — MIDAZOLAM HCL 2 MG/2ML IJ SOLN
1.0000 mg | INTRAMUSCULAR | Status: DC | PRN
  Administered 2017-08-29: 2 mg via INTRAVENOUS

## 2017-08-29 MED ORDER — FENTANYL CITRATE (PF) 100 MCG/2ML IJ SOLN
50.0000 ug | INTRAMUSCULAR | Status: DC | PRN
  Administered 2017-08-29 (×2): 50 ug via INTRAVENOUS

## 2017-08-29 MED ORDER — PROPOFOL 500 MG/50ML IV EMUL
INTRAVENOUS | Status: DC | PRN
  Administered 2017-08-29: 125 ug/kg/min via INTRAVENOUS

## 2017-08-29 MED ORDER — MEPERIDINE HCL 25 MG/ML IJ SOLN: 6.2500 mg | INTRAMUSCULAR | Status: DC | PRN

## 2017-08-29 MED ORDER — FENTANYL CITRATE (PF) 100 MCG/2ML IJ SOLN
INTRAMUSCULAR | Status: AC
  Filled 2017-08-29: qty 2

## 2017-08-29 MED ORDER — CLINDAMYCIN PHOSPHATE 900 MG/50ML IV SOLN
INTRAVENOUS | Status: AC
  Filled 2017-08-29: qty 50

## 2017-08-29 MED ORDER — MIDAZOLAM HCL 2 MG/2ML IJ SOLN
INTRAMUSCULAR | Status: AC
  Filled 2017-08-29: qty 2

## 2017-08-29 MED ORDER — HYDROMORPHONE HCL 1 MG/ML IJ SOLN: 0.2500 mg | INTRAMUSCULAR | Status: DC | PRN

## 2017-08-29 MED ORDER — CLINDAMYCIN PHOSPHATE 900 MG/50ML IV SOLN
900.0000 mg | INTRAVENOUS | Status: AC
  Administered 2017-08-29: 900 mg via INTRAVENOUS

## 2017-08-29 MED ORDER — ONDANSETRON HCL 4 MG/2ML IJ SOLN
INTRAMUSCULAR | Status: AC
  Filled 2017-08-29: qty 2

## 2017-08-29 MED ORDER — GLYCOPYRROLATE 0.2 MG/ML IJ SOLN
INTRAMUSCULAR | Status: DC | PRN
  Administered 2017-08-29: 0.2 mg via INTRAVENOUS

## 2017-08-29 MED ORDER — SCOPOLAMINE 1 MG/3DAYS TD PT72: 1.0000 | MEDICATED_PATCH | Freq: Once | TRANSDERMAL | Status: DC | PRN

## 2017-08-29 MED ORDER — BACITRACIN 500 UNIT/GM EX OINT
TOPICAL_OINTMENT | CUTANEOUS | Status: DC | PRN
  Administered 2017-08-29: 1 via TOPICAL

## 2017-08-29 MED ORDER — BUPIVACAINE-EPINEPHRINE 0.25% -1:200000 IJ SOLN
INTRAMUSCULAR | Status: DC | PRN
  Administered 2017-08-29: 2.5 mL

## 2017-08-29 MED ORDER — BACITRACIN ZINC 500 UNIT/GM EX OINT
TOPICAL_OINTMENT | CUTANEOUS | Status: AC
  Filled 2017-08-29: qty 0.9

## 2017-08-29 MED ORDER — LIDOCAINE-EPINEPHRINE 1 %-1:100000 IJ SOLN
INTRAMUSCULAR | Status: AC
  Filled 2017-08-29: qty 1

## 2017-08-29 MED ORDER — ONDANSETRON HCL 4 MG/2ML IJ SOLN: 4.0000 mg | Freq: Once | INTRAMUSCULAR | Status: DC | PRN

## 2017-08-29 SURGICAL SUPPLY — 56 items
BENZOIN TINCTURE PRP APPL 2/3 (GAUZE/BANDAGES/DRESSINGS) IMPLANT
BLADE CLIPPER SURG (BLADE) IMPLANT
BLADE SURG 11 STRL SS (BLADE) IMPLANT
BLADE SURG 15 STRL LF DISP TIS (BLADE) ×1 IMPLANT
BLADE SURG 15 STRL SS (BLADE) ×1
CANISTER SUCT 1200ML W/VALVE (MISCELLANEOUS) IMPLANT
CHLORAPREP W/TINT 26ML (MISCELLANEOUS) IMPLANT
COVER BACK TABLE 60X90IN (DRAPES) IMPLANT
COVER MAYO STAND STRL (DRAPES) IMPLANT
DERMABOND ADVANCED (GAUZE/BANDAGES/DRESSINGS)
DERMABOND ADVANCED .7 DNX12 (GAUZE/BANDAGES/DRESSINGS) IMPLANT
DRAIN JP 10F RND SILICONE (MISCELLANEOUS) IMPLANT
DRAPE LAPAROTOMY 100X72 PEDS (DRAPES) IMPLANT
DRAPE U-SHAPE 76X120 STRL (DRAPES) IMPLANT
DRSG TELFA 3X8 NADH (GAUZE/BANDAGES/DRESSINGS) IMPLANT
ELECT COATED BLADE 2.86 ST (ELECTRODE) IMPLANT
ELECT NEEDLE BLADE 2-5/6 (NEEDLE) ×2 IMPLANT
ELECT REM PT RETURN 9FT ADLT (ELECTROSURGICAL) ×2
ELECT REM PT RETURN 9FT PED (ELECTROSURGICAL)
ELECTRODE REM PT RETRN 9FT PED (ELECTROSURGICAL) IMPLANT
ELECTRODE REM PT RTRN 9FT ADLT (ELECTROSURGICAL) ×1 IMPLANT
EVACUATOR SILICONE 100CC (DRAIN) IMPLANT
GAUZE SPONGE 4X4 12PLY STRL LF (GAUZE/BANDAGES/DRESSINGS) IMPLANT
GAUZE XEROFORM 1X8 LF (GAUZE/BANDAGES/DRESSINGS) IMPLANT
GLOVE BIO SURGEON STRL SZ 6 (GLOVE) ×2 IMPLANT
GLOVE BIO SURGEON STRL SZ 6.5 (GLOVE) ×2 IMPLANT
GOWN STRL REUS W/ TWL LRG LVL3 (GOWN DISPOSABLE) ×2 IMPLANT
GOWN STRL REUS W/TWL LRG LVL3 (GOWN DISPOSABLE) ×2
NEEDLE HYPO 30GX1 BEV (NEEDLE) ×2 IMPLANT
NEEDLE PRECISIONGLIDE 27X1.5 (NEEDLE) IMPLANT
NS IRRIG 1000ML POUR BTL (IV SOLUTION) IMPLANT
PACK BASIN DAY SURGERY FS (CUSTOM PROCEDURE TRAY) ×2 IMPLANT
PENCIL BUTTON HOLSTER BLD 10FT (ELECTRODE) ×2 IMPLANT
RUBBERBAND STERILE (MISCELLANEOUS) IMPLANT
SHEET MEDIUM DRAPE 40X70 STRL (DRAPES) IMPLANT
SLEEVE SCD COMPRESS KNEE MED (MISCELLANEOUS) ×2 IMPLANT
SPONGE GAUZE 2X2 8PLY STRL LF (GAUZE/BANDAGES/DRESSINGS) IMPLANT
SPONGE LAP 18X18 RF (DISPOSABLE) IMPLANT
STRIP CLOSURE SKIN 1/2X4 (GAUZE/BANDAGES/DRESSINGS) IMPLANT
SUCTION FRAZIER HANDLE 10FR (MISCELLANEOUS)
SUCTION TUBE FRAZIER 10FR DISP (MISCELLANEOUS) IMPLANT
SUT ETHILON 4 0 PS 2 18 (SUTURE) IMPLANT
SUT MNCRL AB 4-0 PS2 18 (SUTURE) IMPLANT
SUT MON AB 5-0 P3 18 (SUTURE) IMPLANT
SUT PLAIN 5 0 P 3 18 (SUTURE) IMPLANT
SUT PROLENE 5 0 P 3 (SUTURE) IMPLANT
SUT PROLENE 6 0 P 1 18 (SUTURE) IMPLANT
SUT VICRYL 4-0 PS2 18IN ABS (SUTURE) IMPLANT
SWAB COLLECTION DEVICE MRSA (MISCELLANEOUS) IMPLANT
SWAB CULTURE ESWAB REG 1ML (MISCELLANEOUS) IMPLANT
SYR BULB 3OZ (MISCELLANEOUS) IMPLANT
SYR CONTROL 10ML LL (SYRINGE) ×2 IMPLANT
TOWEL OR 17X24 6PK STRL BLUE (TOWEL DISPOSABLE) ×2 IMPLANT
TRAY DSU PREP LF (CUSTOM PROCEDURE TRAY) IMPLANT
TUBE CONNECTING 20X1/4 (TUBING) IMPLANT
YANKAUER SUCT BULB TIP 10FT TU (MISCELLANEOUS) IMPLANT

## 2017-08-29 NOTE — Anesthesia Preprocedure Evaluation (Signed)
Anesthesia Evaluation  Patient identified by MRN, date of birth, ID band Patient awake    Reviewed: Allergy & Precautions, NPO status , Patient's Chart, lab work & pertinent test results  History of Anesthesia Complications (+) PONV  Airway Mallampati: I  TM Distance: >3 FB Neck ROM: Full    Dental   Pulmonary asthma ,    Pulmonary exam normal        Cardiovascular Normal cardiovascular exam     Neuro/Psych Anxiety Depression    GI/Hepatic GERD  Medicated and Controlled,  Endo/Other    Renal/GU      Musculoskeletal   Abdominal   Peds  Hematology   Anesthesia Other Findings   Reproductive/Obstetrics                             Anesthesia Physical Anesthesia Plan  ASA: II  Anesthesia Plan: MAC   Post-op Pain Management:    Induction: Intravenous  PONV Risk Score and Plan: 3 and Ondansetron and Dexamethasone  Airway Management Planned:   Additional Equipment:   Intra-op Plan:   Post-operative Plan:   Informed Consent: I have reviewed the patients History and Physical, chart, labs and discussed the procedure including the risks, benefits and alternatives for the proposed anesthesia with the patient or authorized representative who has indicated his/her understanding and acceptance.     Plan Discussed with: CRNA and Surgeon  Anesthesia Plan Comments:         Anesthesia Quick Evaluation

## 2017-08-29 NOTE — Transfer of Care (Signed)
Immediate Anesthesia Transfer of Care Note  Patient: Pamela Long  Procedure(s) Performed: FOREIGN BODY REMOVAL ADULT (N/A Mouth)  Patient Location: PACU  Anesthesia Type:MAC  Level of Consciousness: awake, alert  and oriented  Airway & Oxygen Therapy: Patient Spontanous Breathing and Patient connected to nasal cannula oxygen  Post-op Assessment: Report given to RN and Post -op Vital signs reviewed and stable  Post vital signs: Reviewed and stable  Last Vitals:  Vitals Value Taken Time  BP    Temp    Pulse    Resp    SpO2      Last Pain:  Vitals:   08/29/17 0633  TempSrc: Oral         Complications: No apparent anesthesia complications

## 2017-08-29 NOTE — Discharge Instructions (Signed)

## 2017-08-29 NOTE — Op Note (Signed)
Operative Note   DATE OF OPERATION: 3.22.2019  LOCATION: [facility]-outpatient  SURGICAL DIVISION: Plastic Surgery  PREOPERATIVE DIAGNOSES:  Foreign body upper lipd  POSTOPERATIVE DIAGNOSES:  same  PROCEDURE:  Excision foreign body upper lip  SURGEON: Irene Limbo MD MBA  ASSISTANT: none  ANESTHESIA:  MAC.   EBL: minimal  COMPLICATIONS: None immediate.   INDICATIONS FOR PROCEDURE:  The patient, Pamela Long, is a 47 y.o. female born on 1970/09/04, is here for excision palpable mass upper lip following fall.   FINDINGS: Granuloma surrounding upper lip foreign body 5 mm size.  DESCRIPTION OF PROCEDURE:  The patient's operative site was marked with the patient in the preoperative area. The patient was taken to the operating room. SCDs were placed and IV antibiotics were given. The patient's operative site was prepped and draped in a sterile fashion. A time out was performed and all information was confirmed to be correct. Local anesthetic infiltrated to perform bilateral infraorbital n blocks and within upper lip. Incision made in vermillion over palpable mass. Scissors used to dissect mass from surrounding soft tissue, mass embedded in superficial muscle layer. Simple closure completed with 4-0 chromic suture. Antibiotic ointment applied.  The patient was allowed to wake from anesthesia, and taken to the recovery room in satisfactory condition.   SPECIMENS: foreign body upper lip  DRAINS: none  Irene Limbo, MD [facility] Plastic & Reconstructive Surgery [phone], pin [phone]

## 2017-08-29 NOTE — Interval H&P Note (Signed)
History and Physical Interval Note:  08/29/2017 7:00 AM  Pamela Long  has presented today for surgery, with the diagnosis of FOREIGN BODY UPPER LIP  The various methods of treatment have been discussed with the patient and family. After consideration of risks, benefits and other options for treatment, the patient has consented to  Procedure(s): FOREIGN BODY REMOVAL ADULT (N/A) as a surgical intervention .  The patient's history has been reviewed, patient examined, no change in status, stable for surgery.  I have reviewed the patient's chart and labs.  Questions were answered to the patient's satisfaction.     Jenika Chiem

## 2017-08-29 NOTE — Anesthesia Postprocedure Evaluation (Signed)
Anesthesia Post Note  Patient: Pamela Long  Procedure(s) Performed: FOREIGN BODY REMOVAL ADULT (N/A Mouth)     Patient location during evaluation: PACU Anesthesia Type: MAC Level of consciousness: awake and alert Pain management: pain level controlled Vital Signs Assessment: post-procedure vital signs reviewed and stable Respiratory status: spontaneous breathing, nonlabored ventilation, respiratory function stable and patient connected to nasal cannula oxygen Cardiovascular status: stable and blood pressure returned to baseline Postop Assessment: no apparent nausea or vomiting Anesthetic complications: no    Last Vitals:  Vitals Value Taken Time  BP    Temp    Pulse    Resp    SpO2      Last Pain:  Vitals:   08/29/17 0800  TempSrc:   PainSc: 0-No pain                 Julio Zappia DAVID

## 2017-09-01 ENCOUNTER — Encounter (HOSPITAL_BASED_OUTPATIENT_CLINIC_OR_DEPARTMENT_OTHER): Payer: Self-pay | Admitting: Plastic Surgery

## 2017-09-02 NOTE — Addendum Note (Signed)
Addendum  created 09/02/17 1109 by Tawni Millers, CRNA   Charge Capture section accepted

## 2017-09-30 ENCOUNTER — Ambulatory Visit: Payer: Managed Care, Other (non HMO) | Admitting: Family Medicine

## 2017-10-10 ENCOUNTER — Ambulatory Visit: Admit: 2017-10-10 | Discharge: 2017-10-11 | Payer: PRIVATE HEALTH INSURANCE

## 2017-10-10 DIAGNOSIS — Z170 Estrogen receptor positive status [ER+]: Secondary | ICD-10-CM

## 2017-10-10 DIAGNOSIS — C50112 Malignant neoplasm of central portion of left female breast: Principal | ICD-10-CM

## 2017-10-10 DIAGNOSIS — N3944 Nocturnal enuresis: Secondary | ICD-10-CM

## 2017-10-10 DIAGNOSIS — R413 Other amnesia: Secondary | ICD-10-CM

## 2017-10-10 MED ORDER — CHOLECALCIFEROL (VITAMIN D3) 125 MCG (5,000 UNIT) TABLET
ORAL_TABLET | ORAL | 0 refills | 0.00000 days | Status: CP
Start: 2017-10-10 — End: ?

## 2017-10-18 ENCOUNTER — Ambulatory Visit: Admit: 2017-10-18 | Discharge: 2017-10-19 | Payer: PRIVATE HEALTH INSURANCE

## 2017-10-21 ENCOUNTER — Other Ambulatory Visit: Payer: Self-pay | Admitting: Family Medicine

## 2017-10-27 ENCOUNTER — Ambulatory Visit: Admit: 2017-10-27 | Discharge: 2017-10-28 | Payer: PRIVATE HEALTH INSURANCE

## 2017-10-27 DIAGNOSIS — C50112 Malignant neoplasm of central portion of left female breast: Principal | ICD-10-CM

## 2017-10-27 DIAGNOSIS — R413 Other amnesia: Secondary | ICD-10-CM

## 2017-10-27 DIAGNOSIS — N3944 Nocturnal enuresis: Secondary | ICD-10-CM

## 2017-10-27 DIAGNOSIS — Z170 Estrogen receptor positive status [ER+]: Secondary | ICD-10-CM

## 2017-12-02 MED ORDER — GABAPENTIN 100 MG CAPSULE
ORAL_CAPSULE | Freq: Every evening | ORAL | 0 refills | 0.00000 days | Status: CP
Start: 2017-12-02 — End: 2018-01-01

## 2018-01-02 MED ORDER — GABAPENTIN 100 MG CAPSULE
ORAL_CAPSULE | Freq: Every evening | ORAL | 0 refills | 0 days | Status: CP
Start: 2018-01-02 — End: 2018-01-26

## 2018-01-20 ENCOUNTER — Encounter: Payer: Self-pay | Admitting: Cardiology

## 2018-01-20 NOTE — Progress Notes (Signed)
Cardiology Office Note  Date: 01/21/2018   ID: Pamela Long, DOB [birth year], MRN [id number]  PCP: Pamela Albee, Long  Consulting Cardiologist: Pamela Long   Chief Complaint  Patient presents with  . Palpitations    History of Present Illness: Pamela Long is a 47 y.o. female referred for cardiology consultation by Dr. Anastasio Long for the evaluation of palpitations.  She describes an intermittent and unpredictable feeling as if her heart rate races at times.  Currently, this is not an ongoing symptom, but about a month or 2 ago she was experiencing this is much as once weekly.  Seems to have been associated with some degree of emotional stress at the time, not specifically brought on by exertion.  She had no associated syncope.  She reports a history of left-sided breast cancer status post chemotherapy and lumpectomy while she was living in [place].  She follows with oncologist at [facility]. 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 Treatment included Exemestane, Zoladex, and Taxol.  She reports no history of cardiomyopathy.  Does indicate heat intolerance and dyspnea on exertion with activities.  She currently works at a Education [employer], [employer], [employer] in [place].  I personally reviewed her ECG today which shows a sinus rhythm with nonspecific T wave changes.  Past Medical History:  Diagnosis Date  . Anemia   . Anxiety   . Asthma   . Breast cancer ([place])    Left, 2017  . Depression   . GERD (gastroesophageal reflux disease)   . Migraine   . OAB (overactive bladder)   . Seasonal allergies     Past Surgical History:  Procedure Laterality Date  . ABDOMINAL HYSTERECTOMY     breast cancer  . ANKLE RECONSTRUCTION Right 1989  . BREAST SURGERY    . FOREIGN BODY REMOVAL N/A 08/29/2017   Procedure: FOREIGN BODY REMOVAL ADULT;  Surgeon: Pamela Limbo, Long;  Location: [facility];  Service: Plastics;  Laterality: N/A;  . KNEE SURGERY Right 1989   bone spur  . MASTECTOMY  2017   bil mastectomies  . SINUSOTOMY      Current Outpatient Medications  Medication Sig Dispense Refill  . albuterol (PROVENTIL HFA;VENTOLIN HFA) 108 (90 Base) MCG/ACT inhaler Inhale 1 puff into the lungs every 4 (four) hours as needed for wheezing or shortness of breath. 18 g 1  . escitalopram (LEXAPRO) 20 MG tablet Take 1 tablet (20 mg total) by mouth daily. 90 tablet 3  . gabapentin (NEURONTIN) 100 MG capsule Take 300 mg by mouth Nightly.    . naproxen sodium (ANAPROX) 220 MG tablet Take by mouth.    . rizatriptan (MAXALT-MLT) 10 MG disintegrating tablet Take 10 mg by mouth as needed for migraine. May repeat in 2 hours if needed    . tamoxifen (NOLVADEX) 20 MG tablet     . topiramate (TOPAMAX) 50 MG tablet Take 1 tablet (50 mg total) by mouth 2 (two) times daily. 180 tablet 3  . Cholecalciferol (VITAMIN D3) 5000 units TABS Take by mouth every other day.      No current facility-administered medications for this visit.    Allergies:  Corn-containing products; Amoxicillin; Caffeine; and Tetanus toxoids   Social History: The patient  reports that she has never smoked. She has never used smokeless tobacco. She reports that she does not drink alcohol or use drugs.   Family History:  The patient's family history includes Arthritis in her mother; Autism spectrum disorder in her daughter; COPD in her mother; Depression in her mother; Diabetes in her daughter and mother; Drug abuse in her father; Early death in her maternal grandfather and maternal grandmother; Hashimoto's thyroiditis in her daughter; Heart disease in her maternal grandfather and paternal grandfather; Heart disease (age of onset: 56) in her father; Hyperlipidemia in her mother; Hypertension in her father; Irritable bowel syndrome in her daughter; Kidney disease in her father.   ROS:  Please see the history of present  illness. Otherwise, complete review of systems is positive for none.  All other systems are reviewed and negative.   Physical Exam: VS:  BP 113/70   Pulse 88   Ht 5' 5.5" (1.664 m)   Wt 220 lb (99.8 kg)   SpO2 98%   BMI 36.05 kg/m , BMI Body mass index is 36.05 kg/m.  Wt Readings from Last 3 Encounters:  01/21/18 220 lb (99.8 kg)  08/29/17 217 lb 6 oz (98.6 kg)  07/31/17 220 lb (99.8 kg)    General: Overweight woman, appears comfortable at rest. HEENT: Conjunctiva and lids normal, oropharynx clear. Neck: Supple, no elevated JVP or carotid bruits, no thyromegaly. Lungs: Clear to auscultation, nonlabored breathing at rest. Cardiac: Regular rate and rhythm, no S3 or significant systolic murmur, no pericardial rub. Abdomen: Soft, nontender, bowel sounds present, no guarding or rebound. Extremities: Trace ankle edema, distal pulses 2+. Skin: Warm and dry. Musculoskeletal: No kyphosis. Neuropsychiatric: Alert and oriented x3, affect grossly appropriate.  ECG: There is no old tracing available today for review.  Recent Labwork: 04/01/2017: ALT 13; AST 13; BUN 16; Creat 0.82; Hemoglobin 12.9; Platelets 211; Potassium 4.1; Sodium 139     Component Value Date/Time   CHOL 168 04/01/2017 1054   TRIG 189 (H) 04/01/2017 1054   HDL 42 (L) 04/01/2017 1054   CHOLHDL 4.0 04/01/2017 1054   LDLCALC 98 04/01/2017 1054   Assessment and Plan:  1.  History of intermittent palpitations as outlined above, no obvious exertional precipitant and no associated syncope.  At the current time she is not experiencing symptoms.  I talked with her about considering an outpatient heart monitor if symptoms escalate in the future.  2.  Plan to obtain echocardiogram in light of prior history of chemotherapy with breast cancer, also dyspnea on exertion.  ECG is nonspecific.  Current medicines were reviewed with the patient today.   Orders Placed This Encounter  Procedures  . EKG 12-Lead  . ECHOCARDIOGRAM  COMPLETE    Disposition: Call with test results.  Signed, Pamela Sark, Long, [facility] 01/21/2018 11:13 AM    [place] [facility] [address] [place], [place] [address] Phone: [phone]; Fax: [phone]

## 2018-01-21 ENCOUNTER — Ambulatory Visit (INDEPENDENT_AMBULATORY_CARE_PROVIDER_SITE_OTHER): Payer: Managed Care, Other (non HMO) | Admitting: Cardiology

## 2018-01-21 ENCOUNTER — Encounter: Payer: Self-pay | Admitting: Cardiology

## 2018-01-21 VITALS — BP 113/70 | HR 88 | Ht 65.5 in | Wt 220.0 lb

## 2018-01-21 DIAGNOSIS — R0602 Shortness of breath: Secondary | ICD-10-CM

## 2018-01-21 DIAGNOSIS — Z9221 Personal history of antineoplastic chemotherapy: Secondary | ICD-10-CM | POA: Diagnosis not present

## 2018-01-21 DIAGNOSIS — R002 Palpitations: Secondary | ICD-10-CM | POA: Diagnosis not present

## 2018-01-21 NOTE — Patient Instructions (Signed)
Medication Instructions:  Your physician recommends that you continue on your current medications as directed. Please refer to the Current Medication list given to you today.   Labwork: none  Testing/Procedures: Your physician has requested that you have an echocardiogram. Echocardiography is a painless test that uses sound waves to create images of your heart. It provides your doctor with information about the size and shape of your heart and how well your heart's chambers and valves are working. This procedure takes approximately one hour. There are no restrictions for this procedure.    Follow-Up: Your physician recommends that you schedule a follow-up appointment in:  We will call with test results    Any Other Special Instructions Will Be Listed Below (If Applicable).     If you need a refill on your cardiac medications before your next appointment, please call your pharmacy.

## 2018-01-26 MED ORDER — GABAPENTIN 100 MG CAPSULE
ORAL_CAPSULE | Freq: Every evening | ORAL | 1 refills | 0.00000 days | Status: CP
Start: 2018-01-26 — End: 2018-02-04

## 2018-01-28 ENCOUNTER — Ambulatory Visit (HOSPITAL_COMMUNITY): Payer: Managed Care, Other (non HMO)

## 2018-02-04 MED ORDER — GABAPENTIN 300 MG CAPSULE
ORAL_CAPSULE | 1 refills | 0 days | Status: CP
Start: 2018-02-04 — End: 2018-03-25

## 2018-02-05 ENCOUNTER — Ambulatory Visit (HOSPITAL_COMMUNITY)
Admission: RE | Admit: 2018-02-05 | Discharge: 2018-02-05 | Disposition: A | Discharge status: Home / Self Care | Payer: Managed Care, Other (non HMO) | Source: Ambulatory Visit | Attending: Cardiology | Admitting: Cardiology

## 2018-02-05 DIAGNOSIS — E785 Hyperlipidemia, unspecified: Secondary | ICD-10-CM | POA: Diagnosis not present

## 2018-02-05 DIAGNOSIS — K219 Gastro-esophageal reflux disease without esophagitis: Secondary | ICD-10-CM | POA: Insufficient documentation

## 2018-02-05 DIAGNOSIS — R0602 Shortness of breath: Secondary | ICD-10-CM | POA: Diagnosis not present

## 2018-02-05 DIAGNOSIS — Z853 Personal history of malignant neoplasm of breast: Secondary | ICD-10-CM | POA: Insufficient documentation

## 2018-02-05 DIAGNOSIS — R002 Palpitations: Secondary | ICD-10-CM | POA: Insufficient documentation

## 2018-02-05 DIAGNOSIS — Z9221 Personal history of antineoplastic chemotherapy: Secondary | ICD-10-CM

## 2018-02-05 NOTE — Progress Notes (Signed)
*  PRELIMINARY RESULTS* Echocardiogram 2D Echocardiogram has been performed.  Pamela Long 02/05/2018, 3:44 PM

## 2018-03-24 ENCOUNTER — Encounter: Payer: Self-pay | Admitting: Gastroenterology

## 2018-03-25 MED ORDER — GABAPENTIN 300 MG CAPSULE
ORAL_CAPSULE | 1 refills | 0 days | Status: CP
Start: 2018-03-25 — End: 2018-04-27

## 2018-04-02 ENCOUNTER — Encounter: Payer: Self-pay | Admitting: *Deleted

## 2018-04-02 ENCOUNTER — Ambulatory Visit (INDEPENDENT_AMBULATORY_CARE_PROVIDER_SITE_OTHER): Payer: Managed Care, Other (non HMO) | Admitting: Gastroenterology

## 2018-04-02 ENCOUNTER — Other Ambulatory Visit: Payer: Self-pay | Admitting: *Deleted

## 2018-04-02 ENCOUNTER — Encounter: Payer: Self-pay | Admitting: Gastroenterology

## 2018-04-02 DIAGNOSIS — K219 Gastro-esophageal reflux disease without esophagitis: Secondary | ICD-10-CM | POA: Insufficient documentation

## 2018-04-02 DIAGNOSIS — K5900 Constipation, unspecified: Secondary | ICD-10-CM | POA: Insufficient documentation

## 2018-04-02 DIAGNOSIS — K625 Hemorrhage of anus and rectum: Secondary | ICD-10-CM

## 2018-04-02 DIAGNOSIS — K649 Unspecified hemorrhoids: Secondary | ICD-10-CM

## 2018-04-02 MED ORDER — PANTOPRAZOLE SODIUM 40 MG PO TBEC: 40.0000 mg | DELAYED_RELEASE_TABLET | Freq: Every day | ORAL | 3 refills | Status: DC

## 2018-04-02 MED ORDER — POLYETHYLENE GLYCOL 3350 17 GM/SCOOP PO POWD: ORAL | 3 refills | Status: DC

## 2018-04-02 MED ORDER — PEG 3350-KCL-NA BICARB-NACL 420 G PO SOLR: 4000.0000 mL | Freq: Once | ORAL | 0 refills | Status: AC

## 2018-04-02 NOTE — Patient Instructions (Signed)
1. Start pantoprazole 40 mg daily before breakfast.  Prescription sent to your pharmacy. 2. Take MiraLAX 1 capful twice daily until soft BM, then continue once daily as needed to keep stools soft. 3. Colonoscopy as scheduled.  Please see separate instructions. 4. Office visit in 3 months.     Gastroesophageal Reflux Disease, Adult Normally, food travels down the esophagus and stays in the stomach to be digested. However, when a person has gastroesophageal reflux disease (GERD), food and stomach acid move back up into the esophagus. When this happens, the esophagus becomes sore and inflamed. Over time, GERD can create small holes (ulcers) in the lining of the esophagus. What are the causes? This condition is caused by a problem with the muscle between the esophagus and the stomach (lower esophageal sphincter, or LES). Normally, the LES muscle closes after food passes through the esophagus to the stomach. When the LES is weakened or abnormal, it does not close properly, and that allows food and stomach acid to go back up into the esophagus. The LES can be weakened by certain dietary substances, medicines, and medical conditions, including:  Tobacco use.  Pregnancy.  Having a hiatal hernia.  Heavy alcohol use.  Certain foods and beverages, such as coffee, chocolate, onions, and peppermint.  What increases the risk? This condition is more likely to develop in:  People who have an increased body weight.  People who have connective tissue disorders.  People who use NSAID medicines.  What are the signs or symptoms? Symptoms of this condition include:  Heartburn.  Difficult or painful swallowing.  The feeling of having a lump in the throat.  Abitter taste in the mouth.  Bad breath.  Having a large amount of saliva.  Having an upset or bloated stomach.  Belching.  Chest pain.  Shortness of breath or wheezing.  Ongoing (chronic) cough or a night-time cough.  Wearing  away of tooth enamel.  Weight loss.  Different conditions can cause chest pain. Make sure to see your health care provider if you experience chest pain. How is this diagnosed? Your health care provider will take a medical history and perform a physical exam. To determine if you have mild or severe GERD, your health care provider may also monitor how you respond to treatment. You may also have other tests, including:  An endoscopy toexamine your stomach and esophagus with a small camera.  A test thatmeasures the acidity level in your esophagus.  A test thatmeasures how much pressure is on your esophagus.  A barium swallow or modified barium swallow to show the shape, size, and functioning of your esophagus.  How is this treated? The goal of treatment is to help relieve your symptoms and to prevent complications. Treatment for this condition may vary depending on how severe your symptoms are. Your health care provider may recommend:  Changes to your diet.  Medicine.  Surgery.  Follow these instructions at home: Diet  Follow a diet as recommended by your health care provider. This may involve avoiding foods and drinks such as: ? Coffee and tea (with or without caffeine). ? Drinks that containalcohol. ? Energy drinks and sports drinks. ? Carbonated drinks or sodas. ? Chocolate and cocoa. ? Peppermint and mint flavorings. ? Garlic and onions. ? Horseradish. ? Spicy and acidic foods, including peppers, chili powder, curry powder, vinegar, hot sauces, and barbecue sauce. ? Citrus fruit juices and citrus fruits, such as oranges, lemons, and limes. ? Tomato-based foods, such as red sauce, chili,  salsa, and pizza with red sauce. ? Fried and fatty foods, such as donuts, french fries, potato chips, and high-fat dressings. ? High-fat meats, such as hot dogs and fatty cuts of red and white meats, such as rib eye steak, sausage, ham, and bacon. ? High-fat dairy items, such as whole  milk, butter, and cream cheese.  Eat small, frequent meals instead of large meals.  Avoid drinking large amounts of liquid with your meals.  Avoid eating meals during the 2-3 hours before bedtime.  Avoid lying down right after you eat.  Do not exercise right after you eat. General instructions  Pay attention to any changes in your symptoms.  Take over-the-counter and prescription medicines only as told by your health care provider. Do not take aspirin, ibuprofen, or other NSAIDs unless your health care provider told you to do so.  Do not use any tobacco products, including cigarettes, chewing tobacco, and e-cigarettes. If you need help quitting, ask your health care provider.  Wear loose-fitting clothing. Do not wear anything tight around your waist that causes pressure on your abdomen.  Raise (elevate) the head of your bed 6 inches (15cm).  Try to reduce your stress, such as with yoga or meditation. If you need help reducing stress, ask your health care provider.  If you are overweight, reduce your weight to an amount that is healthy for you. Ask your health care provider for guidance about a safe weight loss goal.  Keep all follow-up visits as told by your health care provider. This is important. Contact a health care provider if:  You have new symptoms.  You have unexplained weight loss.  You have difficulty swallowing, or it hurts to swallow.  You have wheezing or a persistent cough.  Your symptoms do not improve with treatment.  You have a hoarse voice. Get help right away if:  You have pain in your arms, neck, jaw, teeth, or back.  You feel sweaty, dizzy, or light-headed.  You have chest pain or shortness of breath.  You vomit and your vomit looks like blood or coffee grounds.  You faint.  Your stool is bloody or black.  You cannot swallow, drink, or eat. This information is not intended to replace advice given to you by your health care provider. Make  sure you discuss any questions you have with your health care provider. Document Released: 03/06/2005 Document Revised: 10/25/2015 Document Reviewed: 09/21/2014 Elsevier Interactive Patient Education  2018 [place] for Gastroesophageal Reflux Disease, Adult When you have gastroesophageal reflux disease (GERD), the foods you eat and your eating habits are very important. Choosing the right foods can help ease the discomfort of GERD. Consider working with a diet and nutrition specialist (dietitian) to help you make healthy food choices. What general guidelines should I follow? Eating plan  Choose healthy foods low in fat, such as fruits, vegetables, whole grains, low-fat dairy products, and lean meat, fish, and poultry.  Eat frequent, small meals instead of three large meals each day. Eat your meals slowly, in a relaxed setting. Avoid bending over or lying down until 2-3 hours after eating.  Limit high-fat foods such as fatty meats or fried foods.  Limit your intake of oils, butter, and shortening to less than 8 teaspoons each day.  Avoid the following: ? Foods that cause symptoms. These may be different for different people. Keep a food diary to keep track of foods that cause symptoms. ? Alcohol. ? Drinking large amounts of liquid  with meals. ? Eating meals during the 2-3 hours before bed.  Cook foods using methods other than frying. This may include baking, grilling, or broiling. Lifestyle   Maintain a healthy weight. Ask your health care provider what weight is healthy for you. If you need to lose weight, work with your health care provider to do so safely.  Exercise for at least 30 minutes on 5 or more days each week, or as told by your health care provider.  Avoid wearing clothes that fit tightly around your waist and chest.  Do not use any products that contain nicotine or tobacco, such as cigarettes and e-cigarettes. If you need help quitting, ask your health  care provider.  Sleep with the head of your bed raised. Use a wedge under the mattress or blocks under the bed frame to raise the head of the bed. What foods are not recommended? The items listed may not be a complete list. Talk with your dietitian about what dietary choices are best for you. Grains Pastries or quick breads with added fat. [place] toast. Vegetables Deep fried vegetables. [place] fries. Any vegetables prepared with added fat. Any vegetables that cause symptoms. For some people this may include tomatoes and tomato products, chili peppers, onions and garlic, and horseradish. Fruits Any fruits prepared with added fat. Any fruits that cause symptoms. For some people this may include citrus fruits, such as oranges, grapefruit, pineapple, and lemons. Meats and other protein foods High-fat meats, such as fatty beef or pork, hot dogs, ribs, ham, sausage, salami and bacon. Fried meat or protein, including fried fish and fried chicken. Nuts and nut butters. Dairy Whole milk and chocolate milk. Sour cream. Cream. Ice cream. Cream cheese. Milk shakes. Beverages Coffee and tea, with or without caffeine. Carbonated beverages. Sodas. Energy drinks. Fruit juice made with acidic fruits (such as orange or grapefruit). Tomato juice. Alcoholic drinks. Fats and oils Butter. Margarine. Shortening. Ghee. Sweets and desserts Chocolate and cocoa. Donuts. Seasoning and other foods Pepper. Peppermint and spearmint. Any condiments, herbs, or seasonings that cause symptoms. For some people, this may include curry, hot sauce, or vinegar-based salad dressings. Summary  When you have gastroesophageal reflux disease (GERD), food and lifestyle choices are very important to help ease the discomfort of GERD.  Eat frequent, small meals instead of three large meals each day. Eat your meals slowly, in a relaxed setting. Avoid bending over or lying down until 2-3 hours after eating.  Limit high-fat foods such as  fatty meat or fried foods. This information is not intended to replace advice given to you by your health care provider. Make sure you discuss any questions you have with your health care provider. Document Released: 05/27/2005 Document Revised: 05/28/2016 Document Reviewed: 05/28/2016 Elsevier Interactive Patient Education  [employer].

## 2018-04-02 NOTE — Assessment & Plan Note (Signed)
Intermittent rectal bleeding in the setting of constipation, suspect benign anorectal source such as hemorrhoids or fissure.  Encouraged her to utilize MiraLAX to manage her constipation.  She will take 1 capful twice daily until she develops soft stool, then continue 1 capful daily as needed to maintain soft stool.  If ineffective she will call for prescription such as Linzess or Amitiza.  Colonoscopy in the near future, plan for deep sedation given chronic use of antidepressants.  I have discussed the risks, alternatives, benefits with regards to but not limited to the risk of reaction to medication, bleeding, infection, perforation and the patient is agreeable to proceed. Written consent to be obtained.

## 2018-04-02 NOTE — Progress Notes (Signed)
Primary Care Physician:  Doree Albee, MD  Primary Gastroenterologist:  Garfield Cornea, MD   Chief Complaint  Patient presents with  . Rectal Bleeding    hx breast cancer, never had tcs  . Gastroesophageal Reflux    HPI:  Pamela Long is a 47 y.o. female here at the request of Dr. Anastasio Champion for further evaluation of rectal bleeding.  Patient moved to the area about a year ago, has lived most of her life in [place].  After treatment for breast cancer, chemo she states she was unable to work full-time and could not afford to live in [place] anymore.  She moved to the area due to low cost of living.  She has 1 daughter, age 4, with Asperger's syndrome, Hashimoto's thyroiditis, diabetes.  Patient states she chronically has had constipation issues.  Seemed to get worse with her chemotherapy 2 years ago.  Episodes of straining associated with rectal bleeding either from hemorrhoids or tear.  Has tried to regulate stools by eating more fiber recently on a intermittent fasting type diet which has exacerbated her constipation.  Denies any abdominal pain.  She has had frequent heartburn although can go a week at a time with no symptoms.  Historically used to be on medication to prevent heartburn.  Remotely had an EGD while in [place].  Currently taking Zantac as needed.  Has not been very effective.  Denies dysphagia or vomiting.  Complains of hiccups that are associated with meals.   Current Outpatient Medications  Medication Sig Dispense Refill  . albuterol (PROVENTIL HFA;VENTOLIN HFA) 108 (90 Base) MCG/ACT inhaler Inhale 1 puff into the lungs every 4 (four) hours as needed for wheezing or shortness of breath. 18 g 1  . Cholecalciferol (VITAMIN D3) 5000 units TABS Take by mouth daily.     Marland Kitchen escitalopram (LEXAPRO) 20 MG tablet Take 1 tablet (20 mg total) by mouth daily. 90 tablet 3  . gabapentin (NEURONTIN) 300 MG capsule Take 600 mg by mouth daily.    . naproxen sodium (ANAPROX) 220 MG tablet Take  by mouth.    . NP THYROID 30 MG tablet Take 30 mg by mouth daily.  3  . rizatriptan (MAXALT-MLT) 10 MG disintegrating tablet Take 10 mg by mouth as needed for migraine. May repeat in 2 hours if needed    . tamoxifen (NOLVADEX) 20 MG tablet Take 20 mg by mouth daily.     Marland Kitchen topiramate (TOPAMAX) 50 MG tablet Take 1 tablet (50 mg total) by mouth 2 (two) times daily. 180 tablet 3   No current facility-administered medications for this visit.     Allergies as of 04/02/2018 - Review Complete 04/02/2018  Allergen Reaction Noted  . Corn-containing products Other (See Comments) 08/29/2017  . Amoxicillin Hives 02/12/2017  . Caffeine Diarrhea, Nausea Only, and Palpitations 01/16/2017  . Tetanus toxoids Swelling 02/12/2017    Past Medical History:  Diagnosis Date  . Anemia   . Anxiety   . Asthma   . Breast cancer ([place])    Left, 2017  . Depression   . GERD (gastroesophageal reflux disease)   . Migraine   . OAB (overactive bladder)   . Seasonal allergies     Past Surgical History:  Procedure Laterality Date  . ABDOMINAL HYSTERECTOMY     breast cancer  . ANKLE RECONSTRUCTION Right 1989  . BREAST SURGERY    . FOREIGN BODY REMOVAL N/A 08/29/2017   from lip  . KNEE SURGERY Right 1989   bone  spur  . MASTECTOMY  2017   bil mastectomies  . SINUSOTOMY      Family History  Problem Relation Age of Onset  . Arthritis Mother   . COPD Mother   . Depression Mother   . Diabetes Mother   . Hyperlipidemia Mother   . Kidney disease Father   . Heart disease Father 52  . Drug abuse Father   . Hypertension Father   . Diabetes Daughter   . Hashimoto's thyroiditis Daughter   . Irritable bowel syndrome Daughter   . Autism spectrum disorder Daughter   . Early death Maternal Grandmother        drowned  . Early death Maternal Grandfather   . Heart disease Maternal Grandfather   . Heart disease Paternal Grandfather   . Breast cancer Maternal Aunt   . Breast cancer Cousin   . Lung cancer  Maternal Aunt   . Lung cancer Maternal Uncle   . Colon cancer Neg Hx     Social History   Socioeconomic History  . Marital status: Single    Spouse name: Not on file  . Number of children: 1  . Years of education: 29  . Highest education level: Not on file  Occupational History  . Occupation: disabled  Social Needs  . Financial resource strain: Not on file  . Food insecurity:    Worry: Not on file    Inability: Not on file  . Transportation needs:    Medical: Not on file    Non-medical: Not on file  Tobacco Use  . Smoking status: Never Smoker  . Smokeless tobacco: Never Used  Substance and Sexual Activity  . Alcohol use: No  . Drug use: No  . Sexual activity: Not Currently  Lifestyle  . Physical activity:    Days per week: Not on file    Minutes per session: Not on file  . Stress: Not on file  Relationships  . Social connections:    Talks on phone: Not on file    Gets together: Not on file    Attends religious service: Not on file    Active member of club or organization: Not on file    Attends meetings of clubs or organizations: Not on file    Relationship status: Not on file  . Intimate partner violence:    Fear of current or ex partner: Not on file    Emotionally abused: Not on file    Physically abused: Not on file    Forced sexual activity: Not on file  Other Topics Concern  . Not on file  Social History Narrative   Bachelors degree   Accounting   Lives with daughter [name] who has autism and diabetes   Likes to sew, quilt, crafts, crochet      ROS:  General: Negative for anorexia, unintentional weight loss, fever, chills, fatigue, weakness. Eyes: Negative for vision changes.  ENT: Negative for hoarseness, difficulty swallowing , nasal congestion. CV: Negative for chest pain, angina, palpitations, dyspnea on exertion, peripheral edema.  Respiratory: Negative for dyspnea at rest, dyspnea on exertion, cough, sputum, wheezing.  GI: See history of  present illness. GU:  Negative for dysuria, hematuria, urinary incontinence, urinary frequency, nocturnal urination.  MS: Negative for joint pain, low back pain.  Derm: Negative for rash or itching.  Neuro: Negative for weakness, abnormal sensation, seizure, frequent headaches, memory loss, confusion.  Psych: Negative for anxiety, depression, suicidal ideation, hallucinations.  Endo: Negative for unusual weight change.  Heme: Negative for bruising or bleeding. Allergy: Negative for rash or hives.    Physical Examination:  BP 126/76   Pulse (!) 103   Temp 98.5 F (36.9 C) (Oral)   Ht 5' 5.5" (1.664 m)   Wt 214 lb 3.2 oz (97.2 kg)   BMI 35.10 kg/m    General: Well-nourished, well-developed in no acute distress.  Head: Normocephalic, atraumatic.   Eyes: Conjunctiva pink, no icterus. Mouth: Oropharyngeal mucosa moist and pink , no lesions erythema or exudate. Neck: Supple without thyromegaly, masses, or lymphadenopathy.  Lungs: Clear to auscultation bilaterally.  Heart: Regular rate and rhythm, no murmurs rubs or gallops.  Abdomen: Bowel sounds are normal, nontender, nondistended, no hepatosplenomegaly or masses, no abdominal bruits or    hernia , no rebound or guarding.   Rectal: Deferred Extremities: No lower extremity edema. No clubbing or deformities.  Neuro: Alert and oriented x 4 , grossly normal neurologically.  Skin: Warm and dry, no rash or jaundice.   Psych: Alert and cooperative, normal mood and affect.

## 2018-04-02 NOTE — Assessment & Plan Note (Signed)
Reflux symptoms with poor control.  Historically has history of previous reflux requiring PPI therapy.  Has not been on any preventative medications recently.  H2-blocker ineffective.  Start pantoprazole 40 mg 30 minutes before breakfast daily.  Reinforced antireflux measures.  If symptoms do not settle down, she may require upper endoscopy.  Return to the office in 3 months.

## 2018-04-03 NOTE — Progress Notes (Signed)
CC'D TO PCP °

## 2018-04-22 ENCOUNTER — Other Ambulatory Visit: Payer: Self-pay | Admitting: Neurology

## 2018-04-22 ENCOUNTER — Ambulatory Visit: Admit: 2018-04-22 | Discharge: 2018-04-23 | Payer: PRIVATE HEALTH INSURANCE

## 2018-04-22 DIAGNOSIS — R519 Headache, unspecified: Secondary | ICD-10-CM

## 2018-04-22 DIAGNOSIS — R51 Headache: Principal | ICD-10-CM

## 2018-04-22 DIAGNOSIS — C50112 Malignant neoplasm of central portion of left female breast: Principal | ICD-10-CM

## 2018-04-22 DIAGNOSIS — Z170 Estrogen receptor positive status [ER+]: Secondary | ICD-10-CM

## 2018-04-22 MED ORDER — ANASTROZOLE 1 MG TABLET
ORAL_TABLET | Freq: Every day | ORAL | 5 refills | 0.00000 days | Status: CP
Start: 2018-04-22 — End: ?

## 2018-04-27 MED ORDER — GABAPENTIN 300 MG CAPSULE
ORAL_CAPSULE | 2 refills | 0 days | Status: CP
Start: 2018-04-27 — End: 2018-09-03

## 2018-05-26 ENCOUNTER — Telehealth: Payer: Self-pay

## 2018-05-26 NOTE — Telephone Encounter (Signed)
Pt called office to reschedule TCS that is for 05/29/18. She has a cold and bad cough. TCS w/RMR rescheduled to 07/29/18 at 10:30am. New instructions mailed. LMOVM for endo scheduler.

## 2018-06-17 ENCOUNTER — Encounter (HOSPITAL_COMMUNITY): Payer: Self-pay | Admitting: Emergency Medicine

## 2018-06-17 ENCOUNTER — Emergency Department (HOSPITAL_COMMUNITY): Payer: 59

## 2018-06-17 ENCOUNTER — Other Ambulatory Visit: Payer: Self-pay

## 2018-06-17 ENCOUNTER — Emergency Department (HOSPITAL_COMMUNITY)
Admission: EM | Admit: 2018-06-17 | Discharge: 2018-06-17 | Disposition: A | Discharge status: Home / Self Care | Payer: 59 | Attending: Emergency Medicine | Admitting: Emergency Medicine

## 2018-06-17 DIAGNOSIS — K7689 Other specified diseases of liver: Secondary | ICD-10-CM | POA: Insufficient documentation

## 2018-06-17 DIAGNOSIS — R1011 Right upper quadrant pain: Secondary | ICD-10-CM | POA: Diagnosis present

## 2018-06-17 DIAGNOSIS — R160 Hepatomegaly, not elsewhere classified: Secondary | ICD-10-CM

## 2018-06-17 DIAGNOSIS — R05 Cough: Secondary | ICD-10-CM | POA: Diagnosis not present

## 2018-06-17 DIAGNOSIS — Z79899 Other long term (current) drug therapy: Secondary | ICD-10-CM | POA: Insufficient documentation

## 2018-06-17 DIAGNOSIS — J45909 Unspecified asthma, uncomplicated: Secondary | ICD-10-CM | POA: Diagnosis not present

## 2018-06-17 LAB — COMPREHENSIVE METABOLIC PANEL
ALT: 57 U/L — ABNORMAL HIGH (ref 0–44)
AST: 91 U/L — ABNORMAL HIGH (ref 15–41)
Albumin: 3.7 g/dL (ref 3.5–5.0)
Alkaline Phosphatase: 55 U/L (ref 38–126)
Anion gap: 9 (ref 5–15)
BUN: 18 mg/dL (ref 6–20)
CALCIUM: 8.7 mg/dL — AB (ref 8.9–10.3)
CHLORIDE: 104 mmol/L (ref 98–111)
CO2: 24 mmol/L (ref 22–32)
Creatinine, Ser: 0.94 mg/dL (ref 0.44–1.00)
GFR calc Af Amer: >60 mL/min (ref >60)
GFR calc non Af Amer: >60 mL/min (ref >60)
Glucose, Bld: 85 mg/dL (ref 70–99)
Potassium: 3.4 mmol/L — ABNORMAL LOW (ref 3.5–5.1)
Sodium: 137 mmol/L (ref 135–145)
Total Bilirubin: 0.7 mg/dL (ref 0.3–1.2)
Total Protein: 7.3 g/dL (ref 6.5–8.1)

## 2018-06-17 LAB — URINALYSIS, ROUTINE W REFLEX MICROSCOPIC
Bilirubin Urine: NEGATIVE
Glucose, UA: NEGATIVE mg/dL
Hgb urine dipstick: NEGATIVE
KETONES UR: NEGATIVE mg/dL
LEUKOCYTES UA: NEGATIVE
Nitrite: NEGATIVE
Protein, ur: NEGATIVE mg/dL
Specific Gravity, Urine: 1.021 (ref 1.005–1.030)
pH: 5 (ref 5.0–8.0)

## 2018-06-17 LAB — LIPASE, BLOOD: LIPASE: 44 U/L (ref 11–51)

## 2018-06-17 LAB — CBC
HCT: 38 % (ref 36.0–46.0)
Hemoglobin: 12.3 g/dL (ref 12.0–15.0)
MCH: 29.4 pg (ref 26.0–34.0)
MCHC: 32.4 g/dL (ref 30.0–36.0)
MCV: 90.7 fL (ref 80.0–100.0)
Platelets: 219 10*3/uL (ref 150–400)
RBC: 4.19 MIL/uL (ref 3.87–5.11)
RDW: 13 % (ref 11.5–15.5)
WBC: 11.4 10*3/uL — ABNORMAL HIGH (ref 4.0–10.5)
nRBC: 0 % (ref 0.0–0.2)

## 2018-06-17 IMAGING — CT CT ABD-PELV W/ CM
3 of 10 series · 11 of 46 positions shown, 17 images · IV contrast (Isovue)
Comparison: Ultrasound dated [DATE]

CLINICAL DATA: CLINICAL DATA
Right upper abdominal pain for the last month, but worsening.
History of breast cancer. Liver lesion on recent ultrasound

EXAM:
CT ANGIOGRAPHY CHEST
CT ABDOMEN AND PELVIS WITH CONTRAST:
TECHNIQUE: Multidetector CT imaging of the chest was performed using the
standard protocol during bolus administration of intravenous
contrast. Multiplanar CT image reconstructions and MIPs were
obtained to evaluate the vascular anatomy. Multidetector CT imaging
of the abdomen and pelvis was performed using the standard protocol
during bolus administration of intravenous contrast.
CONTRAST:  100mL [NR] IOPAMIDOL ([NR]) INJECTION 76%

[Series 5: thins · axial · 0.63mm/px · z∈[+956,+1079]mm · 5 of 248 slices shown]
[im 16/248  soft-tissue]
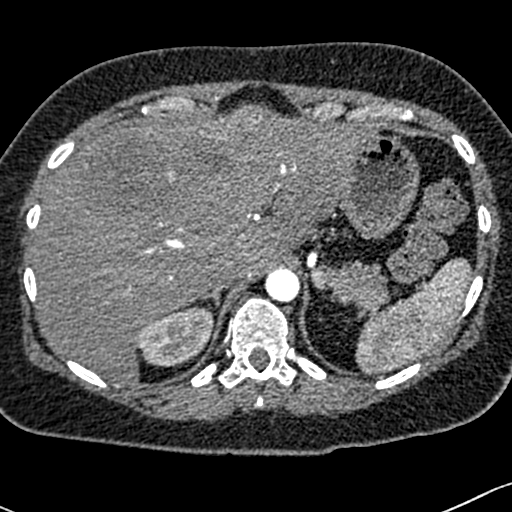
[im 47/248  soft-tissue]
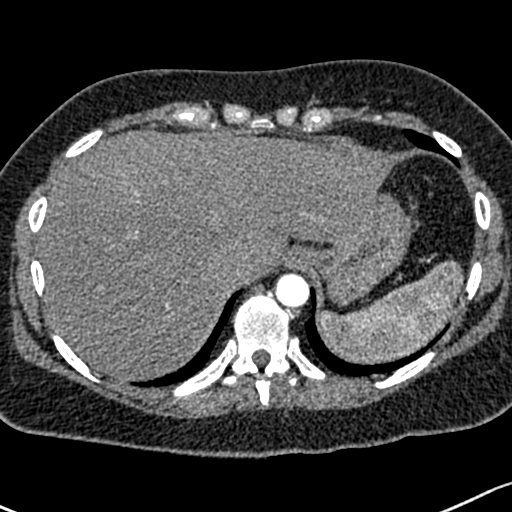
[im 78/248  soft-tissue]
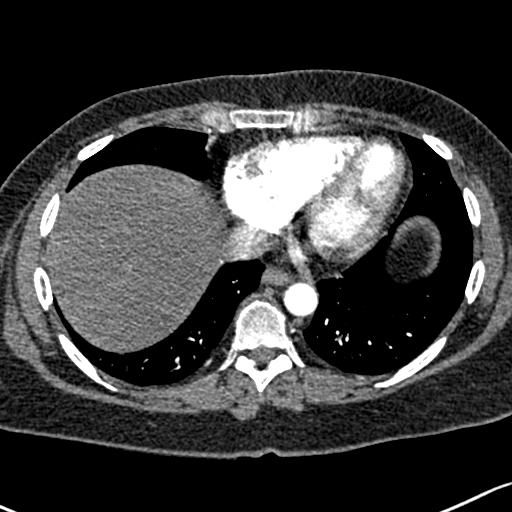
[im 109/248  soft-tissue]
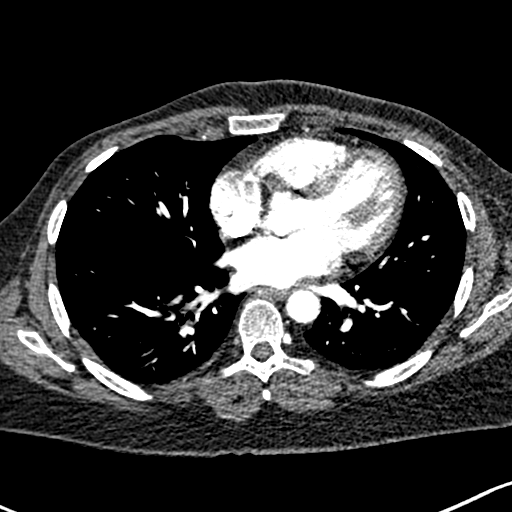
[im 139/248  soft-tissue]
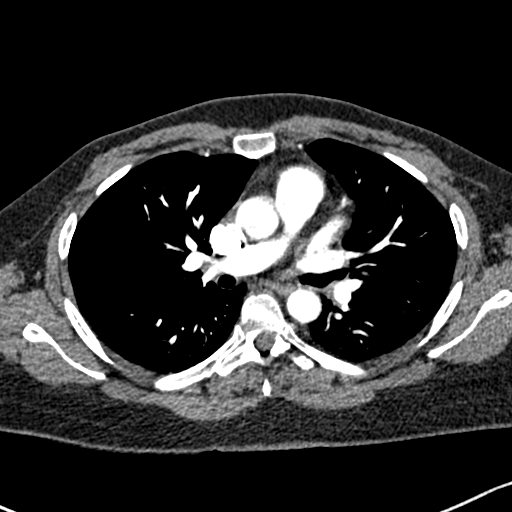

[Series 7: coronal mpr · coronal · 0.49mm/px · 1 of 125 slices shown, 2 images]
[im 63/125  soft-tissue]
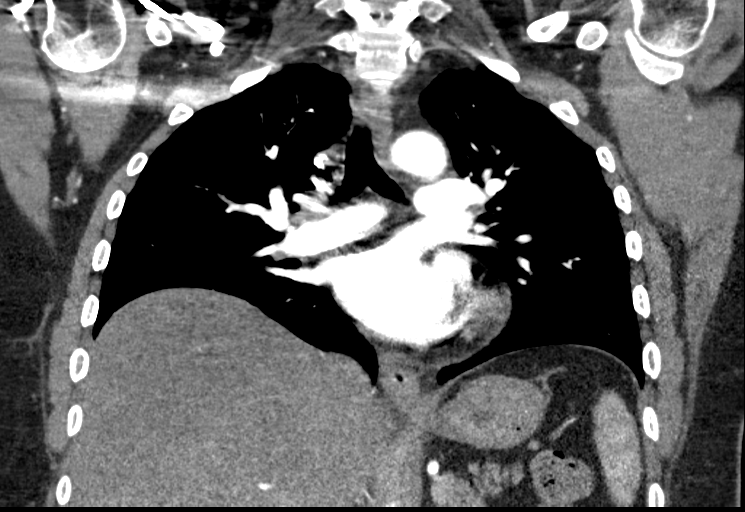
[im 63/125  bone]
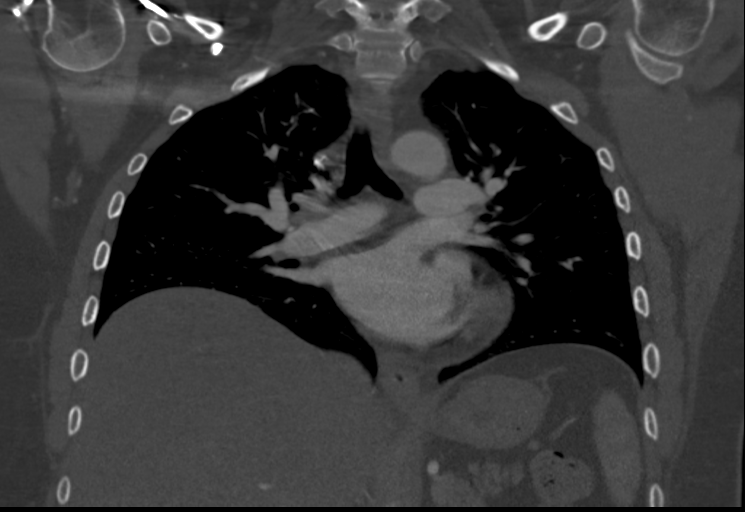

[Series 12: axial st · axial · 0.88mm/px · z∈[+638,+968]mm · 5 of 100 slices shown, 10 images]
[im 17/100  soft-tissue]
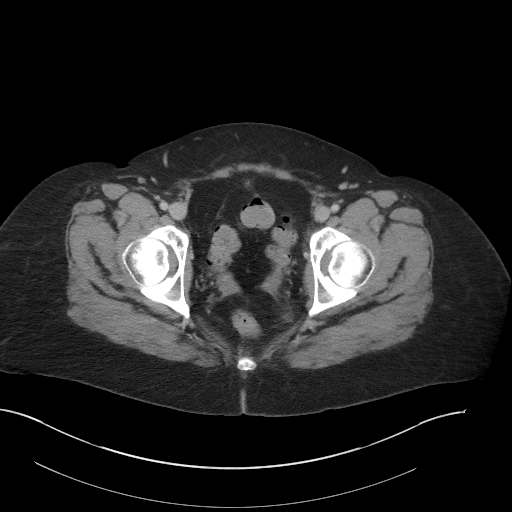
[im 17/100  bone]
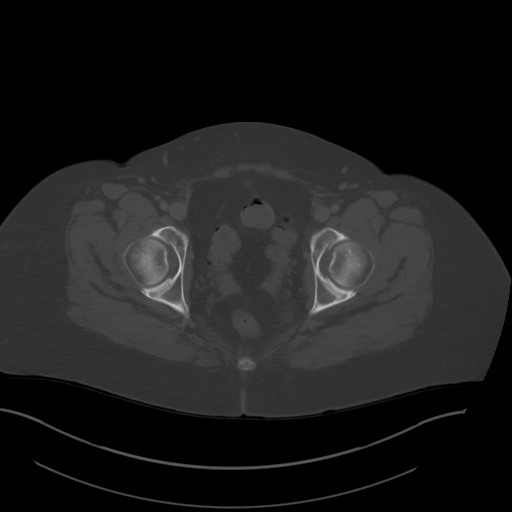
[im 34/100  soft-tissue]
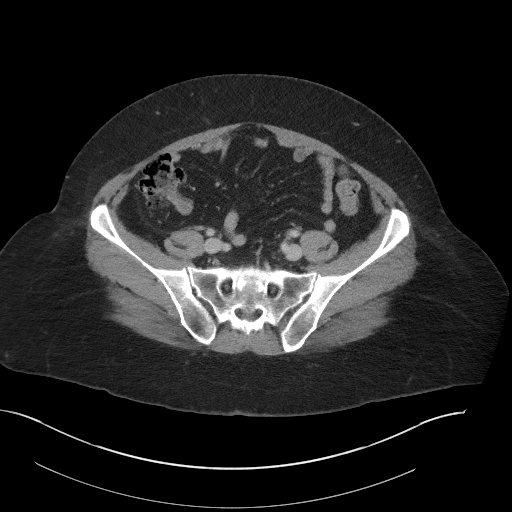
[im 34/100  lung]
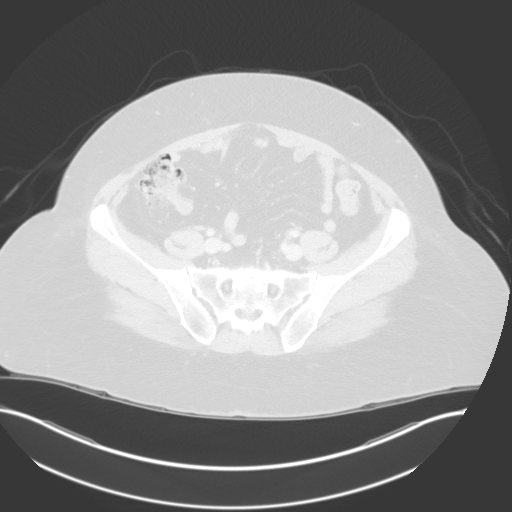
[im 50/100  soft-tissue]
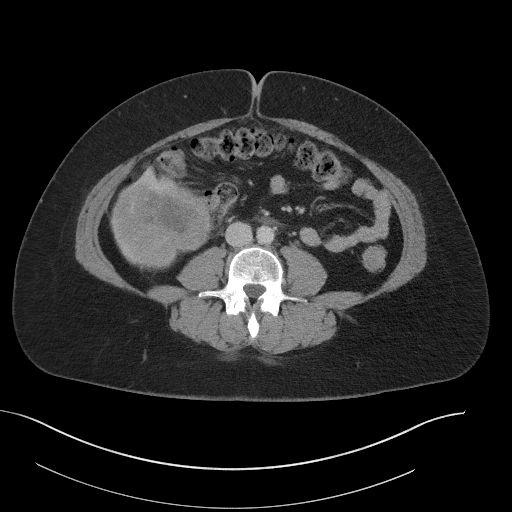
[im 50/100  lung]
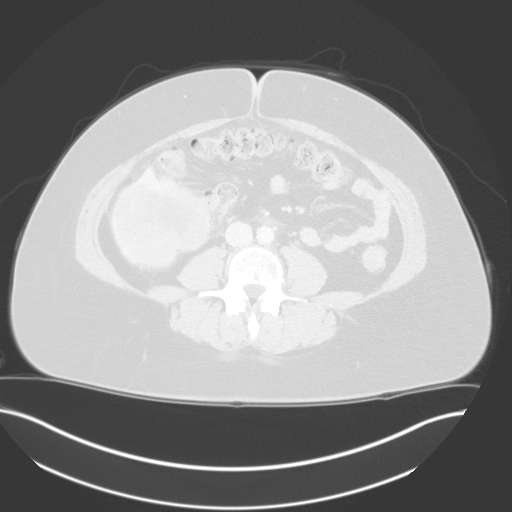
[im 67/100  soft-tissue]
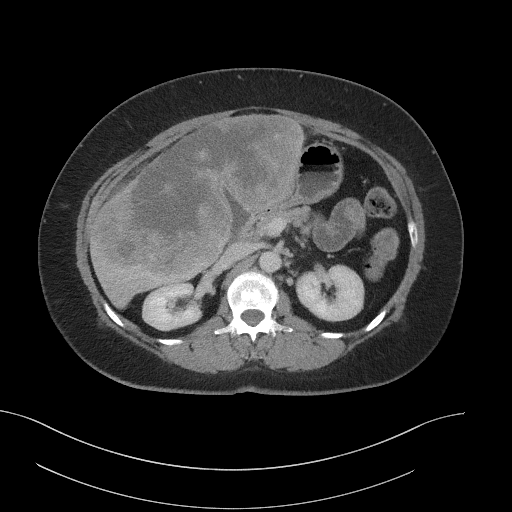
[im 67/100  lung]
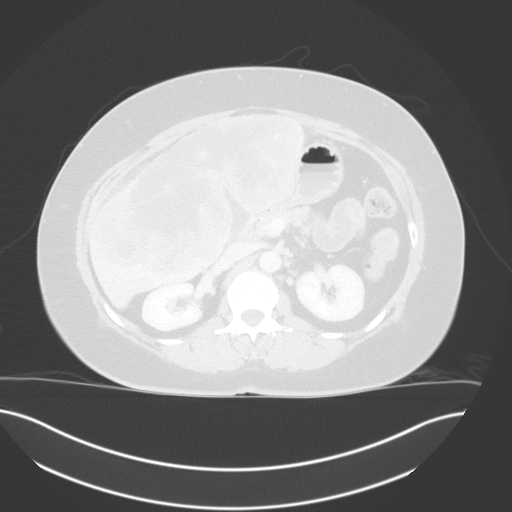
[im 83/100  soft-tissue]
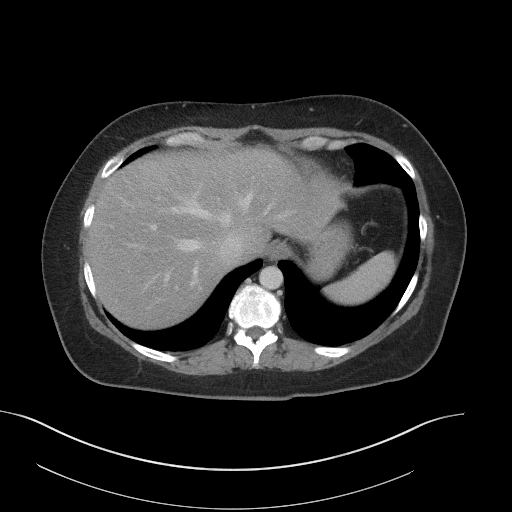
[im 83/100  lung]
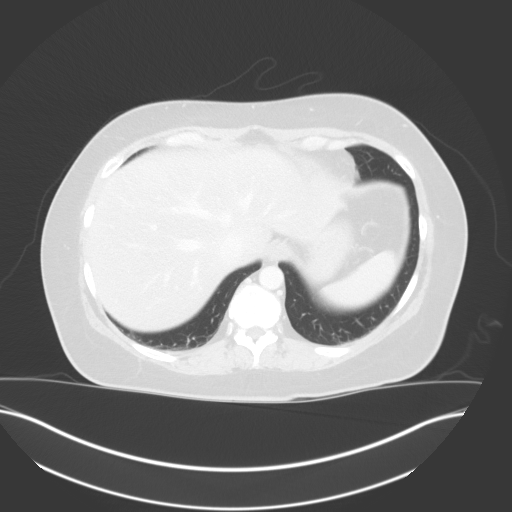

[11 of 46 positions shown; findings below may reference images not displayed]

FINDINGS: CTA CHEST FINDINGS

Cardiovascular: No filling defect is identified in the pulmonary
arterial tree to suggest pulmonary embolus. No acute vascular
findings in the chest. Upper normal heart size.

Mediastinum/Nodes: Left lower neck level IV lymph node 0.9 cm in
short axis, image [DATE].

Left internal mammary lymph node 0.8 cm in short axis, image 32/4.

Lungs/Pleura: Unremarkable

Musculoskeletal: Mild lower thoracic spondylosis.

Review of the MIP images confirms the above findings.

CT ABDOMEN and PELVIS FINDINGS

Hepatobiliary: A mass primarily involving segments IVb, V, and VI in
the liver measures 21.8 by 11.0 by 18.1 cm (volume = [NR] cm^3)
demonstrates heterogeneous internal enhancement and areas of central
necrosis. Highly suspicious appearance for malignancy. This bulges
the liver surface and multiple locations.

No separate hepatic lesions are identified. There is some extrinsic
compression of the gallbladder due to the bulging contours of this
mass.

Pancreas: Unremarkable

Spleen: Unremarkable

Adrenals/Urinary Tract: Unremarkable

Stomach/Bowel: There is some extrinsic narrowing of the gastric body
and antrum due to the bulging mass from the liver. Normal appendix.

Vascular/Lymphatic: Celiac node 1.2 cm in short axis on image [DATE].
Left periaortic node 1.1 cm in short axis, image 40/12. There is
mild extrinsic narrowing of the IVC due to bulging of the hepatic
tumor margin for example on image 39/12.

Reproductive: Uterus absent.  Adnexa unremarkable.

Other: No supplemental non-categorized findings.

Musculoskeletal: Unremarkable

Review of the MIP images confirms the above findings.
IMPRESSION: 1. Very large heterogeneously enhancing hepatic mass spanning across
segments IV, V, and VI in the liver, and measuring up to 22 cm in
long axis. Appearance strongly favors malignancy. Given that this is
a solitary liver mass, the possibility of a primary liver tumor such
as hepatocellular carcinoma or cholangiocarcinoma might be
considered as an alternative to metastatic disease, although there
are also some enlarged lymph nodes in the chest and lower neck as
well as the upper abdomen.
2. Mildly enlarged left internal mammary node at 0.8 cm diameter.
Borderline enlarged left lower neck level IV lymph node at 0.9 cm in
short axis. Mildly enlarged celiac and left periaortic lymph nodes
in the upper abdomen.
3. The liver mass causes some extrinsic narrowing of adjacent
lesions because it bulges the liver capsule. There is some extrinsic
narrowing of the gastric body and antrum as well as the IVC and
gallbladder.

## 2018-06-17 IMAGING — US US ABDOMEN LIMITED
1 series · 14 of 25 positions shown · non-contrast
Comparison: None.

CLINICAL DATA: Right upper quadrant pain for 1 month. History of
breast cancer.

EXAM:
ULTRASOUND ABDOMEN LIMITED RIGHT UPPER QUADRANT

[Series 1: us abdomen limited · 14 of 65 slices shown]
[im 1/65]
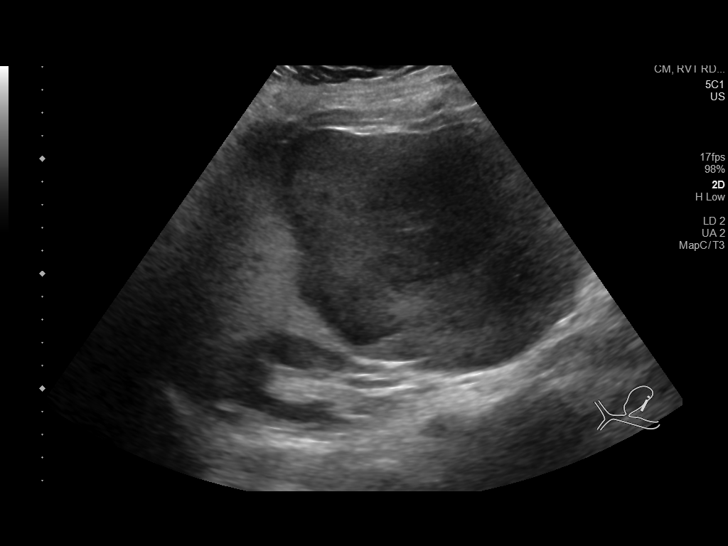
[im 6/65]
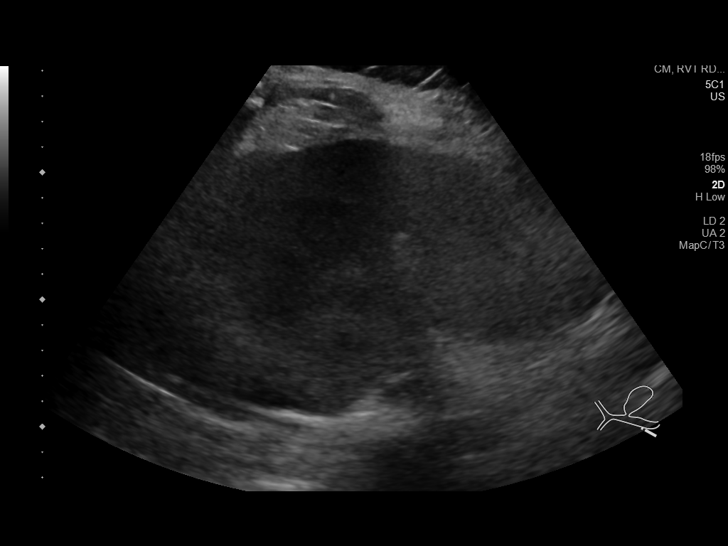
[im 11/65]
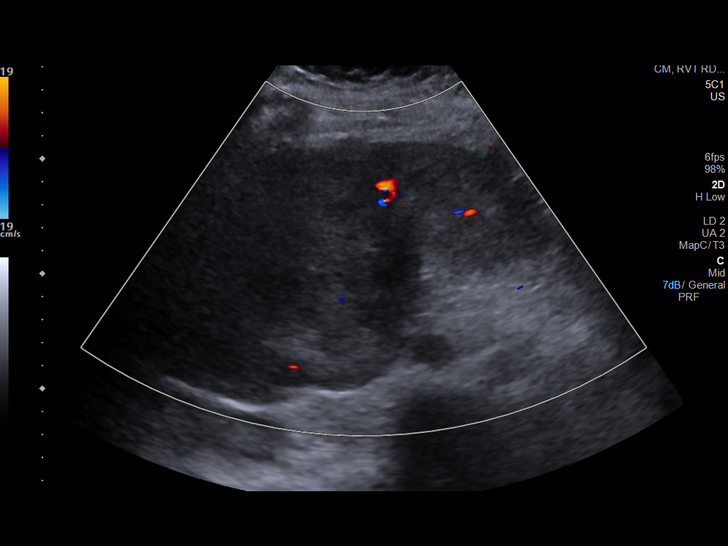
[im 17/65]
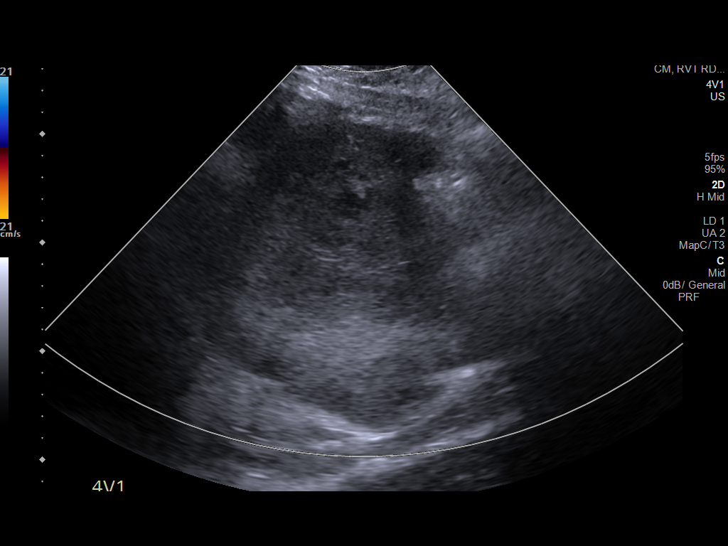
[im 22/65]
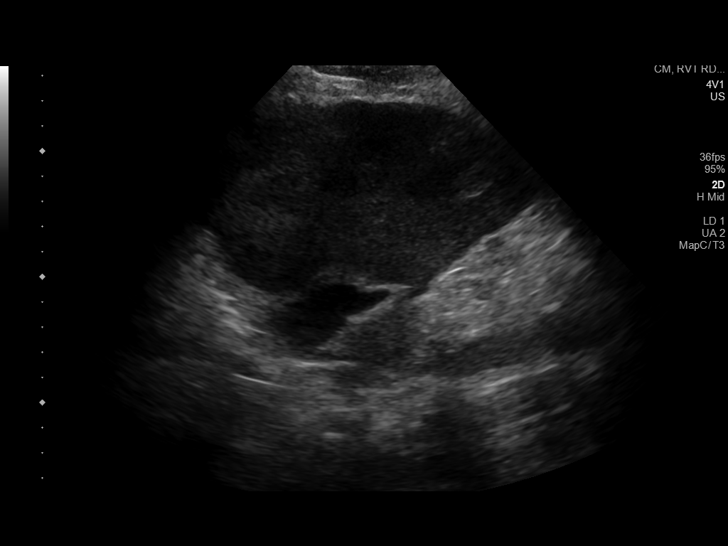
[im 25/65]
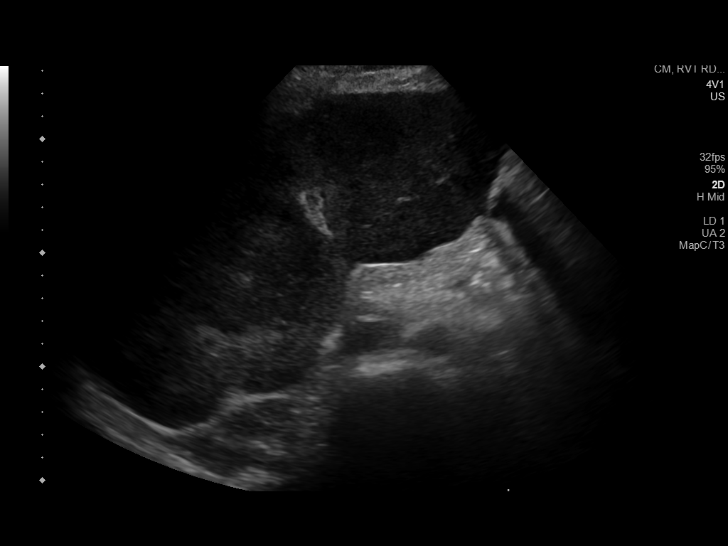
[im 30/65]
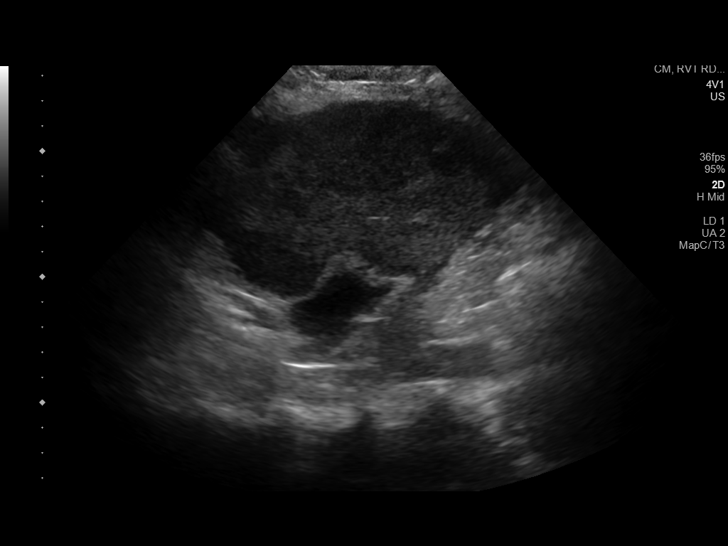
[im 35/65]
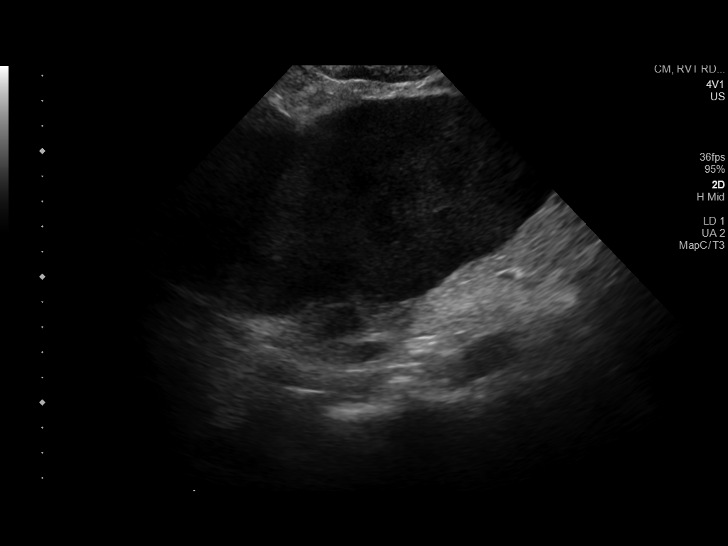
[im 41/65]
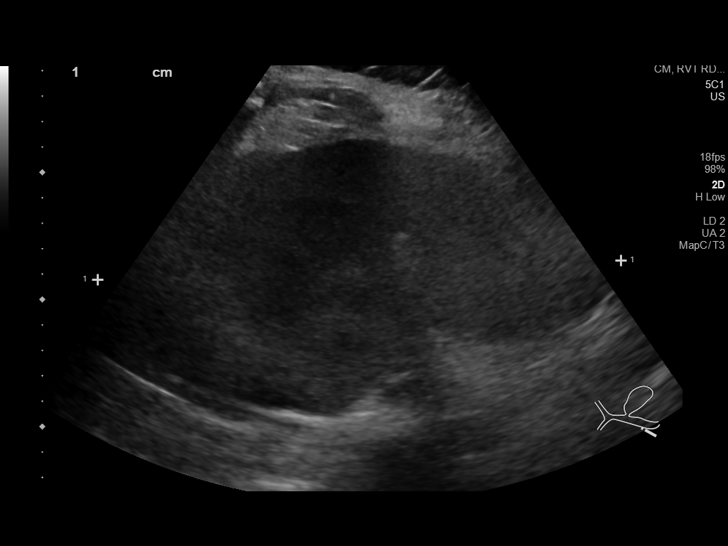
[im 43/65]
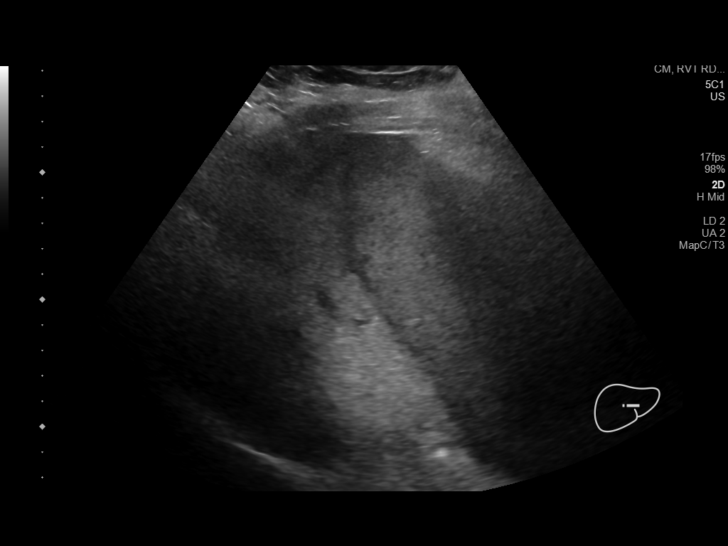
[im 49/65]
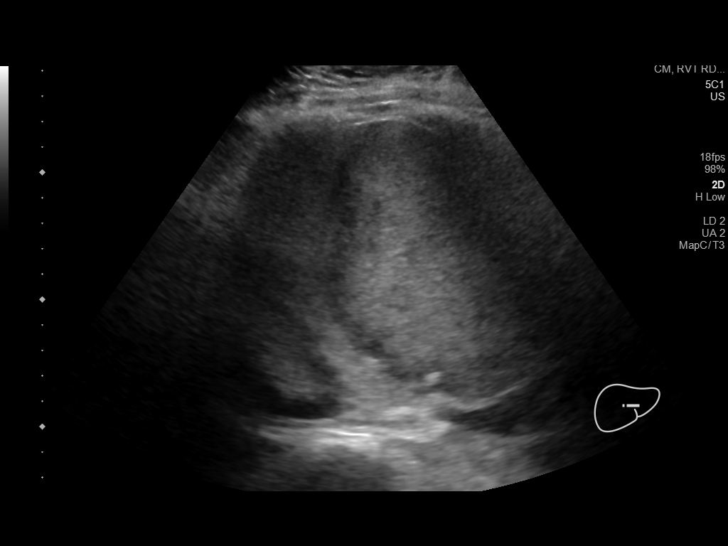
[im 54/65]
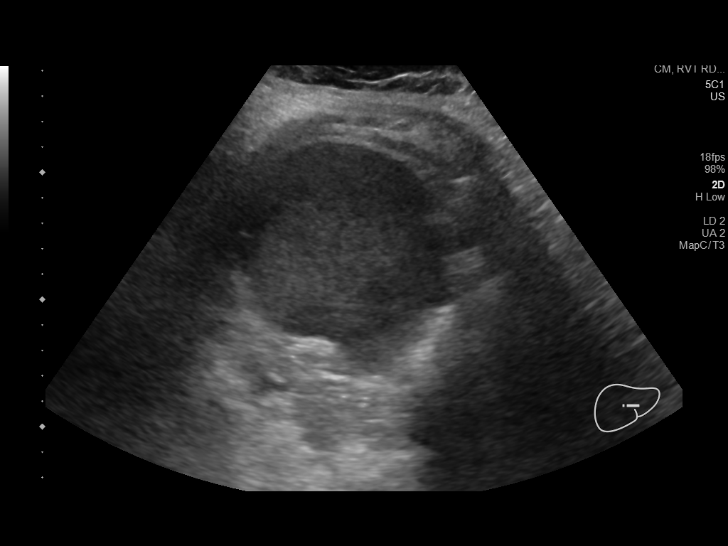
[im 59/65]
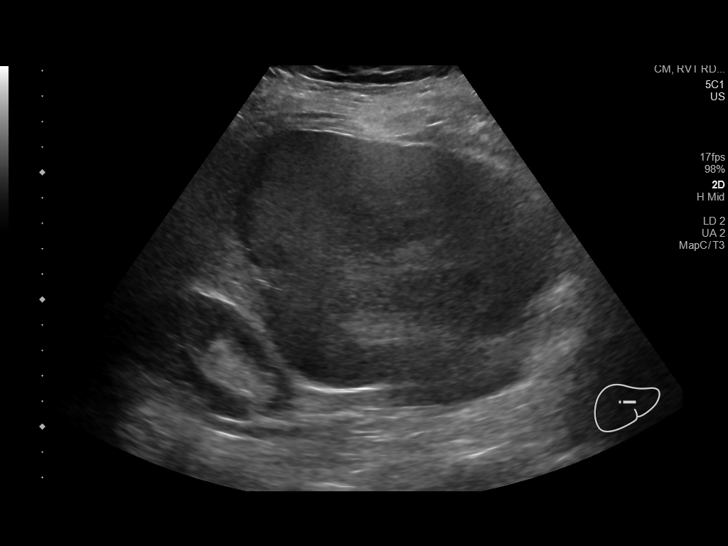
[im 65/65]
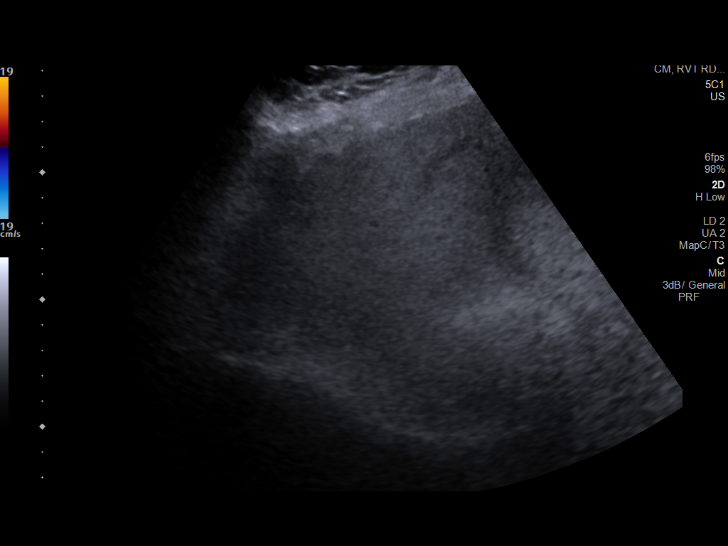

[14 of 25 positions shown; findings below may reference images not displayed]

FINDINGS: Gallbladder:

No gallstones or wall thickening visualized. No sonographic Murphy
sign noted by sonographer.

Common bile duct:

Diameter: 0.4 cm.

Liver:

The liver is heterogeneous. There appears to be a focal lesion in
the left hepatic lobe measuring 11 x 15 x 19 cm. Portal vein is
patent on color Doppler imaging with normal direction of blood flow
towards the liver.
IMPRESSION: Heterogeneous liver with a likely mass in the left hepatic lobe.
Recommend CT abdomen and pelvis with contrast for further
evaluation.

## 2018-06-17 IMAGING — DX DG CHEST 2V
2 series · 2 of 2 positions shown · non-contrast
Comparison: None.

CLINICAL DATA: Right upper quadrant pain for several days

EXAM:
CHEST - 2 VIEW

[chest pa]
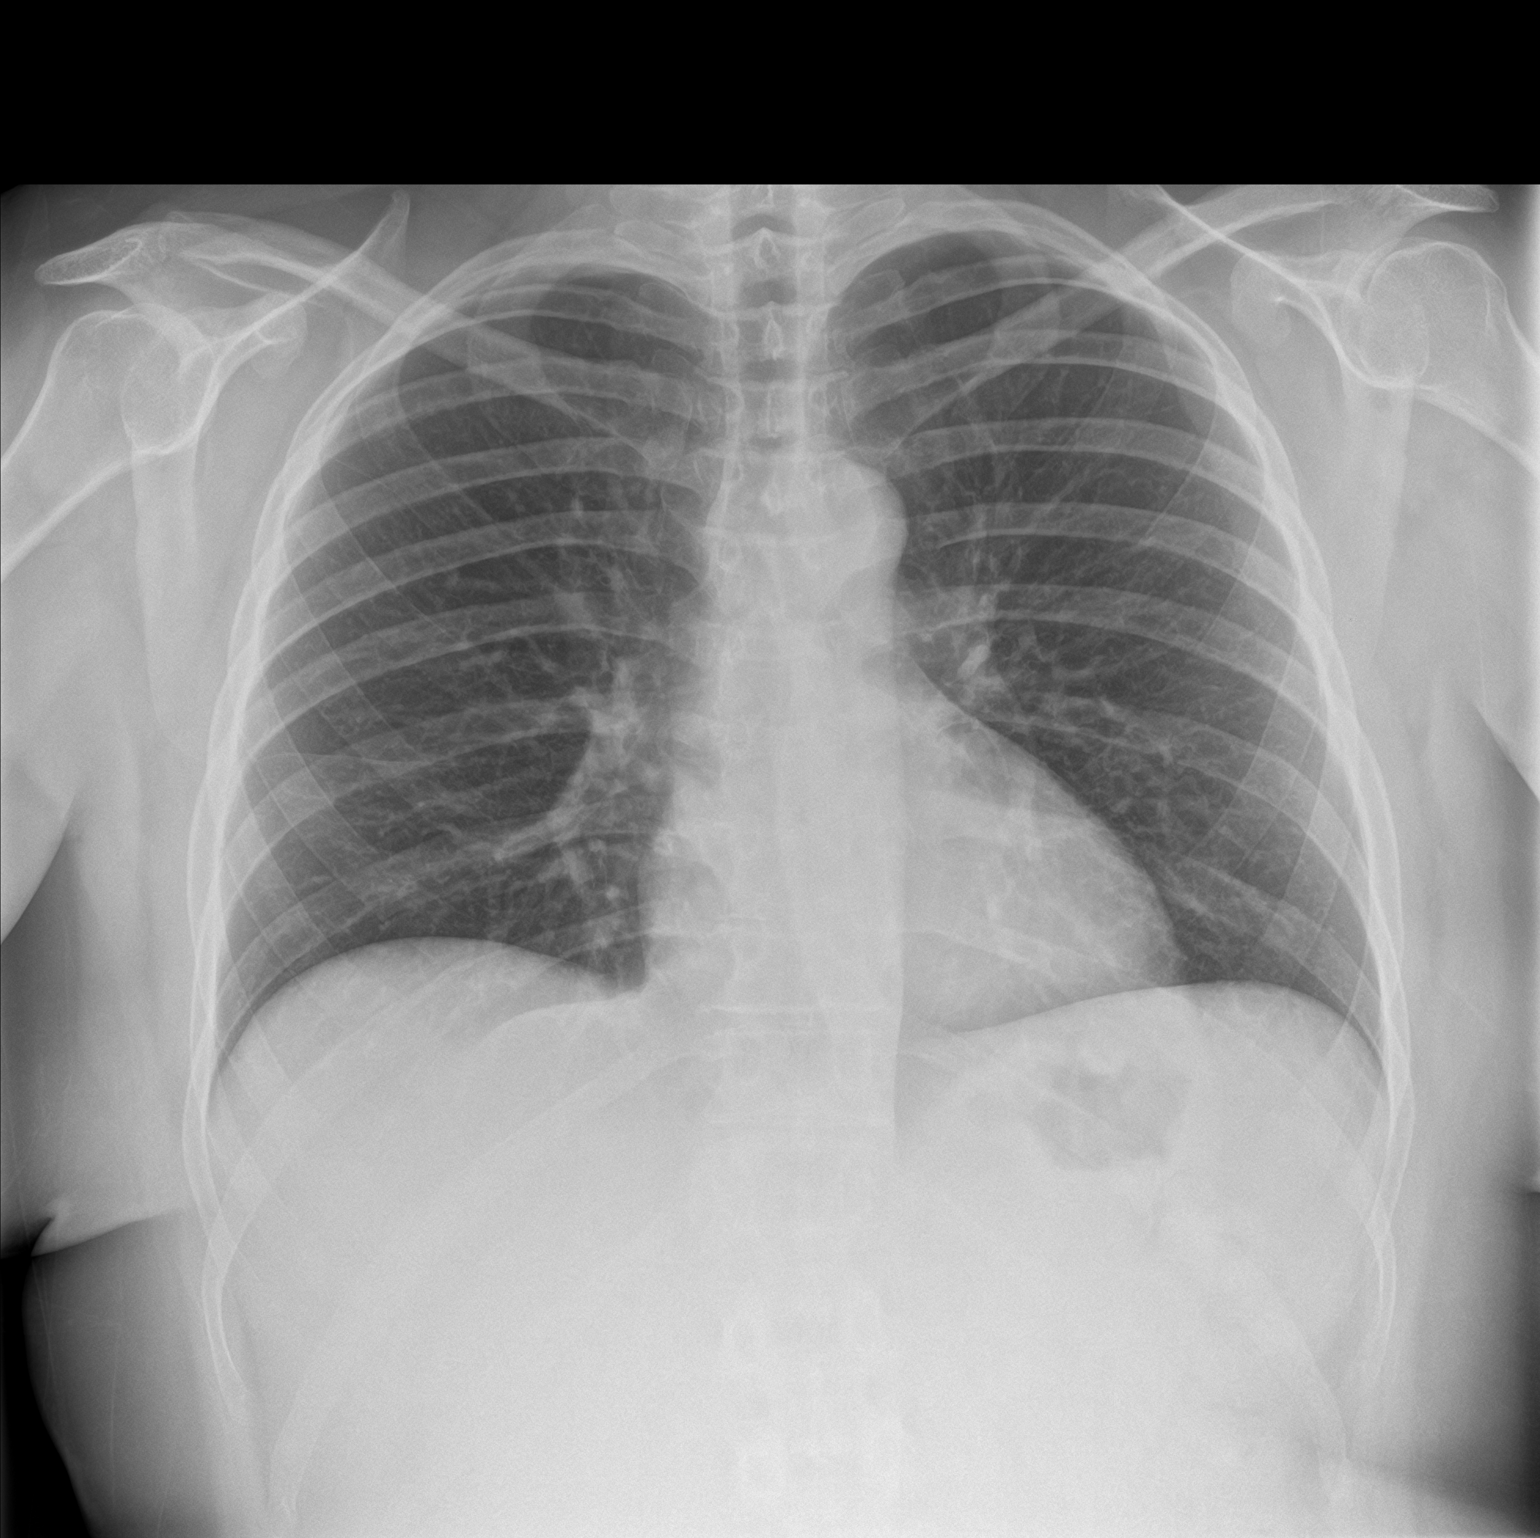

[chest lat]
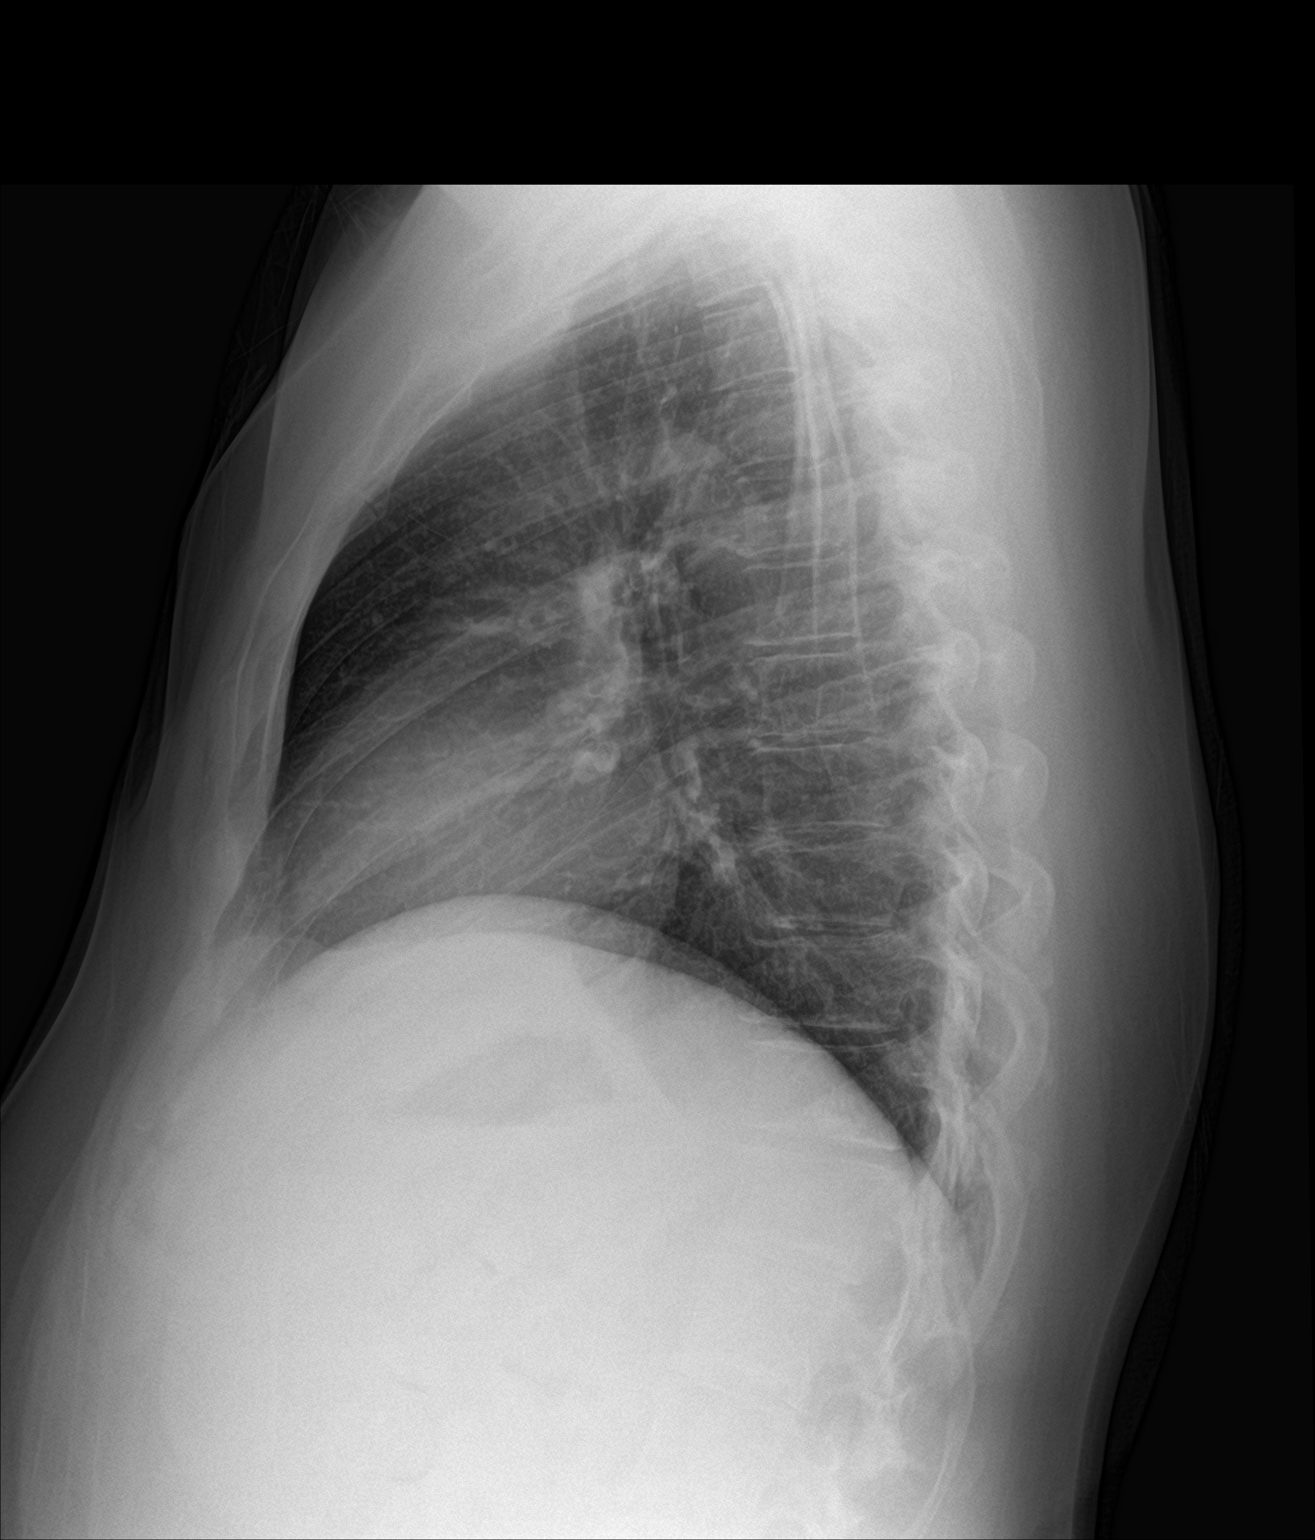

[2 of 2 positions shown; findings below may reference images not displayed]

FINDINGS: The heart size and mediastinal contours are within normal limits.
Both lungs are clear. The visualized skeletal structures are
unremarkable.
IMPRESSION: No active cardiopulmonary disease.

## 2018-06-17 MED ORDER — ONDANSETRON HCL 4 MG/2ML IJ SOLN
4.0000 mg | Freq: Once | INTRAMUSCULAR | Status: AC
  Administered 2018-06-17: 4 mg via INTRAVENOUS
  Filled 2018-06-17: qty 2

## 2018-06-17 MED ORDER — OXYCODONE-ACETAMINOPHEN 5-325 MG PO TABS: 1.0000 | ORAL_TABLET | Freq: Four times a day (QID) | ORAL | 0 refills | Status: DC | PRN

## 2018-06-17 MED ORDER — MORPHINE SULFATE (PF) 4 MG/ML IV SOLN
4.0000 mg | Freq: Once | INTRAVENOUS | Status: AC
  Administered 2018-06-17: 4 mg via INTRAVENOUS
  Filled 2018-06-17: qty 1

## 2018-06-17 MED ORDER — SODIUM CHLORIDE 0.9 % IV BOLUS
1000.0000 mL | Freq: Once | INTRAVENOUS | Status: AC
  Administered 2018-06-17: 1000 mL via INTRAVENOUS

## 2018-06-17 MED ORDER — ONDANSETRON HCL 4 MG PO TABS: 4.0000 mg | ORAL_TABLET | Freq: Three times a day (TID) | ORAL | 0 refills | Status: DC | PRN

## 2018-06-17 MED ORDER — IOPAMIDOL (ISOVUE-370) INJECTION 76%
100.0000 mL | Freq: Once | INTRAVENOUS | Status: AC | PRN
  Administered 2018-06-17: 100 mL via INTRAVENOUS

## 2018-06-17 NOTE — Discharge Instructions (Addendum)
You were seen in the emergency department for right lower chest right upper abdomen pain.  Your CAT scan of your abdomen showed a large liver lesion that is likely the cause of your symptoms.  This will need further work-up.  Please contact your oncologist tomorrow for their assistance in working this up.  We are prescribing you some pain medicine to help with the pain as needed.  Please return to the hospital if you have any worsening symptoms.  Enclosed below is the report of the CAT scan.  IMPRESSION:  1. Very large heterogeneously enhancing hepatic mass spanning across  segments IV, V, and VI in the liver, and measuring up to 22 cm in  long axis. Appearance strongly favors malignancy. Given that this is  a solitary liver mass, the possibility of a primary liver tumor such  as hepatocellular carcinoma or cholangiocarcinoma might be  considered as an alternative to metastatic disease, although there  are also some enlarged lymph nodes in the chest and lower neck as  well as the upper abdomen.  2. Mildly enlarged left internal mammary node at 0.8 cm diameter.  Borderline enlarged left lower neck level IV lymph node at 0.9 cm in  short axis. Mildly enlarged celiac and left periaortic lymph nodes  in the upper abdomen.  3. The liver mass causes some extrinsic narrowing of adjacent  lesions because it bulges the liver capsule. There is some extrinsic  narrowing of the gastric body and antrum as well as the IVC and  gallbladder.

## 2018-06-17 NOTE — ED Provider Notes (Signed)
[facility] EMERGENCY DEPARTMENT Provider Note   CSN: [id number] Arrival date & time: 06/17/18  1214     History   Chief Complaint Chief Complaint  Patient presents with  . Abdominal Pain    HPI Pamela Long is a 48 y.o. female.  She is presenting here with complaints of being sick for about a month.  She said she started with an upper respiratory infection and her doctor treated her with 5 days of steroids and a Z-Pak.  She continues with a cough that is minimally productive of some white sputum.  She began experiencing some subxiphoid pain that radiates around under her right rib cage that is constant but waxes and wanes in intensity.  She says it seemed to be worse with eating.  No hemoptysis.  She has a history of breast cancer and is on oral chemotherapy.  She had a bilateral mastectomy and total abdominal hysterectomy.  The history is provided by the patient.  Abdominal Pain  Pain location:  RUQ and epigastric Pain quality: aching   Pain radiates to:  Does not radiate Pain severity:  Moderate Onset quality:  Gradual Timing:  Intermittent Progression:  Waxing and waning Chronicity:  New Context: eating, previous surgery and recent illness   Relieved by:  Nothing Worsened by:  Eating and deep breathing Associated symptoms: chest pain, cough, fatigue, fever, nausea, shortness of breath, sore throat and vomiting   Associated symptoms: no diarrhea, no hematemesis, no hematochezia, no hematuria, no vaginal bleeding and no vaginal discharge     Past Medical History:  Diagnosis Date  . Anemia   . Anxiety   . Asthma   . Breast cancer ([place])    Left, 2017  . Depression   . GERD (gastroesophageal reflux disease)   . Migraine   . OAB (overactive bladder)   . Seasonal allergies     Patient Active Problem List   Diagnosis Date Noted  . GERD (gastroesophageal reflux disease) 04/02/2018  . Rectal bleeding 04/02/2018  . Constipation 04/02/2018  . OAB (overactive bladder)  07/31/2017  . Vitamin D deficiency 02/26/2017  . Prediabetes 02/26/2017  . HLD (hyperlipidemia) 02/26/2017  . Asthma 02/12/2017  . Depression 02/12/2017  . Migraines 02/12/2017  . Arthralgia 02/12/2017  . Sleep-disordered breathing 02/12/2017  . Positive ANA (antinuclear antibody) 02/12/2017  . Angular cheilitis 02/12/2017  . Obesity (BMI 35.0-39.9 without comorbidity) 02/12/2017  . Malignant neoplasm of central portion of left breast in female, estrogen receptor positive ([place]) 01/05/2017    Past Surgical History:  Procedure Laterality Date  . ABDOMINAL HYSTERECTOMY     breast cancer  . ANKLE RECONSTRUCTION Right 1989  . BREAST SURGERY    . FOREIGN BODY REMOVAL N/A 08/29/2017   from lip  . KNEE SURGERY Right 1989   bone spur  . MASTECTOMY  2017   bil mastectomies  . SINUSOTOMY       OB History    Gravida  1   Para  1   Term  1   Preterm      AB      Living        SAB      TAB      Ectopic      Multiple      Live Births               Home Medications    Prior to Admission medications   Medication Sig Start Date End Date Taking? Authorizing Provider  albuterol (PROVENTIL HFA;VENTOLIN HFA) 108 (90 Base) MCG/ACT inhaler Inhale 1 puff into the lungs every 4 (four) hours as needed for wheezing or shortness of breath. 04/01/17   Raylene Everts, MD  anastrozole (ARIMIDEX) 1 MG tablet Take 1 mg by mouth at bedtime.    [provider]  Carboxymethylcellulose Sodium (REFRESH CELLUVISC OP) Place 1 drop into both eyes 3 (three) times daily as needed (for dry eyes.).    [provider]  Cholecalciferol (VITAMIN D3) 5000 units TABS Take 5,000 Units by mouth every evening.     [provider]  escitalopram (LEXAPRO) 20 MG tablet Take 1 tablet (20 mg total) by mouth daily. 02/12/17   Raylene Everts, MD  gabapentin (NEURONTIN) 300 MG capsule Take 600 mg by mouth daily. 03/04/18   [provider]  naproxen sodium (ALEVE) 220  MG tablet Take 220-440 mg by mouth 2 (two) times daily as needed (pain.).    [provider]  NP THYROID 30 MG tablet Take 30 mg by mouth daily. 02/11/18   [provider]  pantoprazole (PROTONIX) 40 MG tablet Take 1 tablet (40 mg total) by mouth daily before breakfast. 04/02/18   Mahala Menghini, PA-C  polyethylene glycol powder (GLYCOLAX/MIRALAX) powder Take one capful (17 grams) twice a day until soft bowel movement, then take one capful per day to keep stools soft as needed. Patient not taking: Reported on 05/25/2018 04/02/18   Mahala Menghini, PA-C  rizatriptan (MAXALT-MLT) 10 MG disintegrating tablet Take 10 mg by mouth every 2 (two) hours as needed for migraine (May repeat in 2 hours if needed).     [provider]  TROKENDI XR 50 MG CP24 Take 50 mg by mouth 2 (two) times daily.    [provider]    Family History Family History  Problem Relation Age of Onset  . Arthritis Mother   . COPD Mother   . Depression Mother   . Diabetes Mother   . Hyperlipidemia Mother   . Kidney disease Father   . Heart disease Father 65  . Drug abuse Father   . Hypertension Father   . Diabetes Daughter   . Hashimoto's thyroiditis Daughter   . Irritable bowel syndrome Daughter   . Autism spectrum disorder Daughter   . Early death Maternal Grandmother        drowned  . Early death Maternal Grandfather   . Heart disease Maternal Grandfather   . Heart disease Paternal Grandfather   . Breast cancer Maternal Aunt   . Breast cancer Cousin   . Lung cancer Maternal Aunt   . Lung cancer Maternal Uncle   . Colon cancer Neg Hx     Social History Social History   Tobacco Use  . Smoking status: Never Smoker  . Smokeless tobacco: Never Used  Substance Use Topics  . Alcohol use: No  . Drug use: No     Allergies   Corn-containing products; Amoxicillin; Caffeine; and Tetanus toxoids   Review of Systems Review of Systems  Constitutional: Positive for fatigue and  fever.  HENT: Positive for rhinorrhea, sneezing and sore throat.   Eyes: Negative for visual disturbance.  Respiratory: Positive for cough and shortness of breath.   Cardiovascular: Positive for chest pain.  Gastrointestinal: Positive for abdominal pain, nausea and vomiting. Negative for diarrhea, hematemesis and hematochezia.  Genitourinary: Negative for hematuria, vaginal bleeding and vaginal discharge.  Musculoskeletal: Negative for neck pain.  Skin: Negative for rash.  Neurological: Negative  for speech difficulty.     Physical Exam Updated Vital Signs BP 134/77 (BP Location: Right Arm)   Pulse 98   Temp 98.6 F (37 C) (Oral)   Resp 19   Ht 5\' 5"  (1.651 m)   Wt 93.4 kg   SpO2 97%   BMI 34.28 kg/m   Physical Exam Vitals signs and nursing note reviewed.  Constitutional:      General: She is not in acute distress.    Appearance: She is well-developed.  HENT:     Head: Normocephalic and atraumatic.  Eyes:     Conjunctiva/sclera: Conjunctivae normal.  Neck:     Musculoskeletal: Neck supple.  Cardiovascular:     Rate and Rhythm: Normal rate and regular rhythm.     Heart sounds: No murmur.  Pulmonary:     Effort: Pulmonary effort is normal. No respiratory distress.     Breath sounds: Normal breath sounds.  Abdominal:     Palpations: Abdomen is soft.     Tenderness: There is abdominal tenderness in the epigastric area. There is no guarding or rebound.  Musculoskeletal: Normal range of motion.        General: No tenderness, deformity or signs of injury.  Skin:    General: Skin is warm and dry.     Capillary Refill: Capillary refill takes less than 2 seconds.  Neurological:     General: No focal deficit present.     Mental Status: She is alert and oriented to person, place, and time.      ED Treatments / Results  Labs (all labs ordered are listed, but only abnormal results are displayed) Labs Reviewed  COMPREHENSIVE METABOLIC PANEL - Abnormal; Notable for the  following components:      Result Value   Potassium 3.4 (*)    Calcium 8.7 (*)    AST 91 (*)    ALT 57 (*)    All other components within normal limits  CBC - Abnormal; Notable for the following components:   WBC 11.4 (*)    All other components within normal limits  LIPASE, BLOOD  URINALYSIS, ROUTINE W REFLEX MICROSCOPIC    EKG None  Radiology Dg Chest 2 View  Result Date: 06/17/2018 CLINICAL DATA:  Right upper quadrant pain for several days EXAM: CHEST - 2 VIEW COMPARISON:  None. FINDINGS: The heart size and mediastinal contours are within normal limits. Both lungs are clear. The visualized skeletal structures are unremarkable. IMPRESSION: No active cardiopulmonary disease. Electronically Signed   By: Inez Catalina M.D.   On: 06/17/2018 16:11   Ct Angio Chest Pe W/cm &/or Wo Cm  Result Date: 06/17/2018 CLINICAL DATA:  CLINICAL DATA Right upper abdominal pain for the last month, but worsening. History of breast cancer. Liver lesion on recent ultrasound EXAM: CT ANGIOGRAPHY CHEST CT ABDOMEN AND PELVIS WITH CONTRAST: TECHNIQUE: Multidetector CT imaging of the chest was performed using the standard protocol during bolus administration of intravenous contrast. Multiplanar CT image reconstructions and MIPs were obtained to evaluate the vascular anatomy. Multidetector CT imaging of the abdomen and pelvis was performed using the standard protocol during bolus administration of intravenous contrast. CONTRAST:  171mL ISOVUE-370 IOPAMIDOL (ISOVUE-370) INJECTION 76% COMPARISON:  Ultrasound dated 06/17/2018 FINDINGS: CTA CHEST FINDINGS Cardiovascular: No filling defect is identified in the pulmonary arterial tree to suggest pulmonary embolus. No acute vascular findings in the chest. Upper normal heart size. Mediastinum/Nodes: Left lower neck level IV lymph node 0.9 cm in short axis, image 8/4. Left internal  mammary lymph node 0.8 cm in short axis, image 32/4. Lungs/Pleura: Unremarkable Musculoskeletal:  Mild lower thoracic spondylosis. Review of the MIP images confirms the above findings. CT ABDOMEN and PELVIS FINDINGS Hepatobiliary: A mass primarily involving segments IVb, V, and VI in the liver measures 21.8 by 11.0 by 18.1 cm (volume = 2270 cm^3) demonstrates heterogeneous internal enhancement and areas of central necrosis. Highly suspicious appearance for malignancy. This bulges the liver surface and multiple locations. No separate hepatic lesions are identified. There is some extrinsic compression of the gallbladder due to the bulging contours of this mass. Pancreas: Unremarkable Spleen: Unremarkable Adrenals/Urinary Tract: Unremarkable Stomach/Bowel: There is some extrinsic narrowing of the gastric body and antrum due to the bulging mass from the liver. Normal appendix. Vascular/Lymphatic: Celiac node 1.2 cm in short axis on image 30/12. Left periaortic node 1.1 cm in short axis, image 40/12. There is mild extrinsic narrowing of the IVC due to bulging of the hepatic tumor margin for example on image 39/12. Reproductive: Uterus absent.  Adnexa unremarkable. Other: No supplemental non-categorized findings. Musculoskeletal: Unremarkable Review of the MIP images confirms the above findings. IMPRESSION: 1. Very large heterogeneously enhancing hepatic mass spanning across segments IV, V, and VI in the liver, and measuring up to 22 cm in long axis. Appearance strongly favors malignancy. Given that this is a solitary liver mass, the possibility of a primary liver tumor such as hepatocellular carcinoma or cholangiocarcinoma might be considered as an alternative to metastatic disease, although there are also some enlarged lymph nodes in the chest and lower neck as well as the upper abdomen. 2. Mildly enlarged left internal mammary node at 0.8 cm diameter. Borderline enlarged left lower neck level IV lymph node at 0.9 cm in short axis. Mildly enlarged celiac and left periaortic lymph nodes in the upper abdomen. 3. The  liver mass causes some extrinsic narrowing of adjacent lesions because it bulges the liver capsule. There is some extrinsic narrowing of the gastric body and antrum as well as the IVC and gallbladder. Electronically Signed   By: Van Clines M.D.   On: 06/17/2018 17:34   Ct Abdomen Pelvis W Contrast  Result Date: 06/17/2018 CLINICAL DATA:  CLINICAL DATA Right upper abdominal pain for the last month, but worsening. History of breast cancer. Liver lesion on recent ultrasound EXAM: CT ANGIOGRAPHY CHEST CT ABDOMEN AND PELVIS WITH CONTRAST: TECHNIQUE: Multidetector CT imaging of the chest was performed using the standard protocol during bolus administration of intravenous contrast. Multiplanar CT image reconstructions and MIPs were obtained to evaluate the vascular anatomy. Multidetector CT imaging of the abdomen and pelvis was performed using the standard protocol during bolus administration of intravenous contrast. CONTRAST:  164mL ISOVUE-370 IOPAMIDOL (ISOVUE-370) INJECTION 76% COMPARISON:  Ultrasound dated 06/17/2018 FINDINGS: CTA CHEST FINDINGS Cardiovascular: No filling defect is identified in the pulmonary arterial tree to suggest pulmonary embolus. No acute vascular findings in the chest. Upper normal heart size. Mediastinum/Nodes: Left lower neck level IV lymph node 0.9 cm in short axis, image 8/4. Left internal mammary lymph node 0.8 cm in short axis, image 32/4. Lungs/Pleura: Unremarkable Musculoskeletal: Mild lower thoracic spondylosis. Review of the MIP images confirms the above findings. CT ABDOMEN and PELVIS FINDINGS Hepatobiliary: A mass primarily involving segments IVb, V, and VI in the liver measures 21.8 by 11.0 by 18.1 cm (volume = 2270 cm^3) demonstrates heterogeneous internal enhancement and areas of central necrosis. Highly suspicious appearance for malignancy. This bulges the liver surface and multiple locations. No separate hepatic lesions  are identified. There is some extrinsic  compression of the gallbladder due to the bulging contours of this mass. Pancreas: Unremarkable Spleen: Unremarkable Adrenals/Urinary Tract: Unremarkable Stomach/Bowel: There is some extrinsic narrowing of the gastric body and antrum due to the bulging mass from the liver. Normal appendix. Vascular/Lymphatic: Celiac node 1.2 cm in short axis on image 30/12. Left periaortic node 1.1 cm in short axis, image 40/12. There is mild extrinsic narrowing of the IVC due to bulging of the hepatic tumor margin for example on image 39/12. Reproductive: Uterus absent.  Adnexa unremarkable. Other: No supplemental non-categorized findings. Musculoskeletal: Unremarkable Review of the MIP images confirms the above findings. IMPRESSION: 1. Very large heterogeneously enhancing hepatic mass spanning across segments IV, V, and VI in the liver, and measuring up to 22 cm in long axis. Appearance strongly favors malignancy. Given that this is a solitary liver mass, the possibility of a primary liver tumor such as hepatocellular carcinoma or cholangiocarcinoma might be considered as an alternative to metastatic disease, although there are also some enlarged lymph nodes in the chest and lower neck as well as the upper abdomen. 2. Mildly enlarged left internal mammary node at 0.8 cm diameter. Borderline enlarged left lower neck level IV lymph node at 0.9 cm in short axis. Mildly enlarged celiac and left periaortic lymph nodes in the upper abdomen. 3. The liver mass causes some extrinsic narrowing of adjacent lesions because it bulges the liver capsule. There is some extrinsic narrowing of the gastric body and antrum as well as the IVC and gallbladder. Electronically Signed   By: Van Clines M.D.   On: 06/17/2018 17:34   US Abdomen Limited Ruq  Result Date: 06/17/2018 CLINICAL DATA:  Right upper quadrant pain for 1 month. History of breast cancer. EXAM: ULTRASOUND ABDOMEN LIMITED RIGHT UPPER QUADRANT COMPARISON:  None. FINDINGS:  Gallbladder: No gallstones or wall thickening visualized. No sonographic Murphy sign noted by sonographer. Common bile duct: Diameter: 0.4 cm. Liver: The liver is heterogeneous. There appears to be a focal lesion in the left hepatic lobe measuring 11 x 15 x 19 cm. Portal vein is patent on color Doppler imaging with normal direction of blood flow towards the liver. IMPRESSION: Heterogeneous liver with a likely mass in the left hepatic lobe. Recommend CT abdomen and pelvis with contrast for further evaluation. Electronically Signed   By: Inge Rise M.D.   On: 06/17/2018 16:07    Procedures Procedures (including critical care time)  Medications Ordered in ED Medications  morphine 4 MG/ML injection 4 mg (has no administration in time range)  sodium chloride 0.9 % bolus 1,000 mL (has no administration in time range)  ondansetron (ZOFRAN) injection 4 mg (has no administration in time range)     Initial Impression / Assessment and Plan / ED Course  I have reviewed the triage vital signs and the nursing notes.  Pertinent labs & imaging results that were available during my care of the patient were reviewed by me and considered in my medical decision making (see chart for details).  Clinical Course as of Jun 18 1012  Wed Jun 17, 8056  6542 48 year old female immunocompromise due to breast cancer here with 1 month of cough upper respiratory infection and now with at least a few weeks of some subxiphoid pain radiating under the right breast that seems to be worse with eating.  Differential includes cholelithiasis, cholecystitis, pneumonia, PE, breast cancer recurrence, rib fractures, pneumothorax.   [MB]  1801 Patient CT concerning for hepatic  mass.  Radiology is concerned this could be either primary liver or metastatic.  I reviewed this with the patient.  She is going to try to reach out to her breast cancer doctor tomorrow at [place].  I will also give her contact for Dr. [name] here locally.   We will prescribe her some pain medicine in the meantime.  She has strict return instructions given to her.   [MB]    Clinical Course User Index [MB] Hayden Rasmussen, MD     Final Clinical Impressions(s) / ED Diagnoses   Final diagnoses:  RUQ abdominal pain  Liver mass    ED Discharge Orders    None       Hayden Rasmussen, MD 06/18/18 1015

## 2018-06-17 NOTE — ED Notes (Signed)
Patient transported to X-ray 

## 2018-06-17 NOTE — ED Notes (Signed)
Patient transported to CT 

## 2018-06-17 NOTE — ED Triage Notes (Signed)
Patient reports upper Right abdominal pain that started last Thursday.

## 2018-06-19 ENCOUNTER — Ambulatory Visit: Admit: 2018-06-19 | Discharge: 2018-06-20

## 2018-06-22 ENCOUNTER — Ambulatory Visit: Admit: 2018-06-22 | Discharge: 2018-06-23 | Payer: PRIVATE HEALTH INSURANCE

## 2018-06-22 DIAGNOSIS — R160 Hepatomegaly, not elsewhere classified: Secondary | ICD-10-CM

## 2018-06-22 DIAGNOSIS — C50112 Malignant neoplasm of central portion of left female breast: Principal | ICD-10-CM

## 2018-06-22 DIAGNOSIS — R51 Headache: Secondary | ICD-10-CM

## 2018-06-22 DIAGNOSIS — Z170 Estrogen receptor positive status [ER+]: Secondary | ICD-10-CM

## 2018-06-23 ENCOUNTER — Encounter (HOSPITAL_COMMUNITY): Payer: Self-pay

## 2018-06-26 ENCOUNTER — Other Ambulatory Visit: Payer: Self-pay | Admitting: Gastroenterology

## 2018-06-26 ENCOUNTER — Ambulatory Visit: Admit: 2018-06-26 | Discharge: 2018-06-27 | Payer: PRIVATE HEALTH INSURANCE

## 2018-06-26 DIAGNOSIS — R160 Hepatomegaly, not elsewhere classified: Principal | ICD-10-CM

## 2018-06-27 DIAGNOSIS — R160 Hepatomegaly, not elsewhere classified: Secondary | ICD-10-CM | POA: Insufficient documentation

## 2018-06-29 ENCOUNTER — Ambulatory Visit (HOSPITAL_COMMUNITY): Payer: 59 | Admitting: Hematology

## 2018-06-29 ENCOUNTER — Ambulatory Visit: Payer: Managed Care, Other (non HMO) | Admitting: Gastroenterology

## 2018-06-29 ENCOUNTER — Ambulatory Visit: Admit: 2018-06-29 | Discharge: 2018-06-30 | Payer: PRIVATE HEALTH INSURANCE

## 2018-06-29 DIAGNOSIS — C50112 Malignant neoplasm of central portion of left female breast: Principal | ICD-10-CM

## 2018-06-29 DIAGNOSIS — Z170 Estrogen receptor positive status [ER+]: Secondary | ICD-10-CM

## 2018-06-29 DIAGNOSIS — R160 Hepatomegaly, not elsewhere classified: Secondary | ICD-10-CM

## 2018-06-30 MED ORDER — PALBOCICLIB 125 MG CAPSULE
ORAL_CAPSULE | 0 refills | 0 days | Status: CP
Start: 2018-06-30 — End: 2018-07-03

## 2018-06-30 NOTE — Unmapped (Signed)
[facility] Specialty Medication Referral: No PA required    Medication (Brand/Generic): IBRANCE 125 MG CAPSULE     Initial Benefits Investigation Claim completed with resulted information below:  No PA required  Patient ABLE to fill at [facility]  Insurance Company:  [employer]  Anticipated Copay: $8.00    As Co-pay is under $25 defined limit, per policy there will be no further investigation of need for financial assistance at this time unless patient requests. This referral has been communicated to the provider and handed off to the [facility] team for further processing and filling of prescribed medication.   ______________________________________________________________________  Please utilize this referral for viewing purposes as it will serve as the central location for all relevant documentation and updates.

## 2018-07-01 NOTE — Unmapped (Signed)
The [employer] tumor sequencing has been ordered by [name] for, Brianna Sheppard,  via inbasket notification.  The provider wishes to utilize the full waiver of consent for this patient (which has been approved for use by the [employer] IRB). Will request from liver biopsy per Dr. Christell Constant 06/26/2018.     An Epic order will be placed  for  [facility] tumor mutation profile, and [facility] will provide the appropriate block of formalin fixed paraffin embedded tissue for shipment to [facility] in [place], [place].     Test results are expected in less than 10 days, once [facility] receives the sample; once it is resulted the [facility] report will be uploaded to the Labs section of this patient???s record.

## 2018-07-02 ENCOUNTER — Encounter (HOSPITAL_COMMUNITY): Payer: Self-pay | Admitting: Hematology

## 2018-07-02 ENCOUNTER — Inpatient Hospital Stay (HOSPITAL_COMMUNITY): Payer: 59 | Attending: Hematology | Admitting: Hematology

## 2018-07-02 ENCOUNTER — Other Ambulatory Visit: Payer: Self-pay

## 2018-07-02 VITALS — BP 117/67 | HR 79 | Temp 98.0°F | Resp 16 | Ht 65.0 in | Wt 207.0 lb

## 2018-07-02 DIAGNOSIS — Z170 Estrogen receptor positive status [ER+]: Secondary | ICD-10-CM

## 2018-07-02 DIAGNOSIS — Z79899 Other long term (current) drug therapy: Secondary | ICD-10-CM

## 2018-07-02 DIAGNOSIS — R5383 Other fatigue: Secondary | ICD-10-CM | POA: Diagnosis not present

## 2018-07-02 DIAGNOSIS — Z90710 Acquired absence of both cervix and uterus: Secondary | ICD-10-CM

## 2018-07-02 DIAGNOSIS — C50912 Malignant neoplasm of unspecified site of left female breast: Secondary | ICD-10-CM

## 2018-07-02 DIAGNOSIS — C787 Secondary malignant neoplasm of liver and intrahepatic bile duct: Secondary | ICD-10-CM | POA: Insufficient documentation

## 2018-07-02 DIAGNOSIS — K219 Gastro-esophageal reflux disease without esophagitis: Secondary | ICD-10-CM | POA: Insufficient documentation

## 2018-07-02 DIAGNOSIS — D649 Anemia, unspecified: Secondary | ICD-10-CM | POA: Diagnosis not present

## 2018-07-02 DIAGNOSIS — Z79818 Long term (current) use of other agents affecting estrogen receptors and estrogen levels: Secondary | ICD-10-CM | POA: Diagnosis not present

## 2018-07-02 DIAGNOSIS — R0602 Shortness of breath: Secondary | ICD-10-CM | POA: Diagnosis not present

## 2018-07-02 DIAGNOSIS — Z79811 Long term (current) use of aromatase inhibitors: Secondary | ICD-10-CM | POA: Insufficient documentation

## 2018-07-02 DIAGNOSIS — F329 Major depressive disorder, single episode, unspecified: Secondary | ICD-10-CM | POA: Diagnosis not present

## 2018-07-02 DIAGNOSIS — Z90722 Acquired absence of ovaries, bilateral: Secondary | ICD-10-CM

## 2018-07-02 DIAGNOSIS — Z801 Family history of malignant neoplasm of trachea, bronchus and lung: Secondary | ICD-10-CM

## 2018-07-02 DIAGNOSIS — Z9013 Acquired absence of bilateral breasts and nipples: Secondary | ICD-10-CM

## 2018-07-02 DIAGNOSIS — C50919 Malignant neoplasm of unspecified site of unspecified female breast: Secondary | ICD-10-CM | POA: Insufficient documentation

## 2018-07-02 DIAGNOSIS — Z800 Family history of malignant neoplasm of digestive organs: Secondary | ICD-10-CM

## 2018-07-02 DIAGNOSIS — C50112 Malignant neoplasm of central portion of left female breast: Secondary | ICD-10-CM

## 2018-07-02 DIAGNOSIS — Z803 Family history of malignant neoplasm of breast: Secondary | ICD-10-CM | POA: Diagnosis not present

## 2018-07-02 MED ORDER — ABEMACICLIB 150 MG PO TABS: 150.0000 mg | ORAL_TABLET | Freq: Two times a day (BID) | ORAL | 0 refills | Status: DC

## 2018-07-02 MED ORDER — FULVESTRANT 250 MG/5ML IM SOLN
500.0000 mg | Freq: Once | INTRAMUSCULAR | Status: AC
  Administered 2018-07-02: 500 mg via INTRAMUSCULAR
  Filled 2018-07-02: qty 10

## 2018-07-02 NOTE — Patient Instructions (Signed)
[facility] Discharge Instructions     Thank you for choosing [facility] at [facility] to provide your oncology and hematology care.  To afford each patient quality time with our provider, please arrive at least 15 minutes before your scheduled appointment time.   If you have a lab appointment with the Cancer Center please come in thru the  Main Entrance and check in at the main information desk  You need to re-schedule your appointment should you arrive 10 or more minutes late.  We strive to give you quality time with our providers, and arriving late affects you and other patients whose appointments are after yours.  Also, if you no show three or more times for appointments you may be dismissed from the clinic at the providers discretion.     Again, thank you for choosing [facility].  Our hope is that these requests will decrease the amount of time that you wait before being seen by our physicians.       _____________________________________________________________  Should you have questions after your visit to [facility], please contact our office at [phone] between the hours of 8:00 a.m. and 4:30 p.m.  Voicemails left after 4:00 p.m. will not be returned until the following business day.  For prescription refill requests, have your pharmacy contact our office and allow 72 hours.    Cancer Center Support Programs:   > Cancer Support Group  2nd Tuesday of the month 1pm-2pm, Journey Room    

## 2018-07-02 NOTE — Progress Notes (Signed)
Written information given to patient about new oral chemotherapy, Verzenio. Explained to patient that she will be getting formal information from the pharmacist and should expect a call around the first of next week.  I explained to the patient that we would need a phone call when she receives the medication so that we can document her start. Her and her daughter were given the opportunity to ask questions and they were answered to their satisfaction.  Consent for verzenio was obtained at this time.    Patient is also going to be receiving Fasolodex injection today and consent was obtained for that as well.    Faslodex given in bilateral buttock. Patient discharged ambulatory and in stable condition.

## 2018-07-02 NOTE — Unmapped (Signed)
Spoke with patient to f/u after Oncology appt today with local Oncologist near her home in [place], [place]. She reports that she will be following the local Oncologist and is being placed on a drug similar to but not Ibrance and an injectable medication. She requested to cancel the order for Ibrance which was sent to [facility] pharmacy as she will not be taking this. She inquired if she needs to keep 10/23/18 f/u appt with Philis Fendt, PA, in this clinic. Advised will confirm this with Dr. Christell Constant.    In Basket message sent to Dr. Christell Constant to provide update and to [facility] pharmacy advising to cancel Ibrance order.

## 2018-07-02 NOTE — Assessment & Plan Note (Addendum)
1.  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 -Underwent neoadjuvant chemotherapy with dose dense AC followed by Taxol from 07/14/2015 through 11/29/2015. - Left lumpectomy plus SLNB, consistent with infiltrating lobular carcinoma, pleomorphic features, ER/PR positive, HER-2 negative, 0 out of 3 lymph nodes positive. - Zoladex 3.6 mg IM monthly started in August 2017 followed by prophylactic right breast mastectomy plus completion of left breast simple mastectomy, radiation not indicated. -Zoladex stopped due to joint pain, restarted in October 2017, exemestane started in January 2018 - Exemestane and Zoladex held secondary to joint pains, TAH and BSO in April 2018, exemestane 25 mg daily -Moved to [place] from [place] in March 2018, and was seeing Dr. Zenovia Jordan at [place]. -Genetic testing with multicolor gene panel was negative. -In August 2018 she was switched to tamoxifen 20 mg daily.  In November 2019 she was switched to anastrozole and she took only for few weeks. -Patient started having epigastric and right upper quadrant pains in mid December 2019. -CT CAP on 06/17/2018 shows very large hepatic mass measuring 22 cm, mildly enlarged left internal mammary node at 0.8 cm, borderline enlarged left lower neck level 4 node at 0.9 cm. - Biopsy of the liver mass on 06/26/2018 showed breast cancer, ER/PR positive and HER-2 negative. -Brain MRI in January 2020 was also negative for metastatic disease. - We had a prolonged discussion about the normal prognosis of estrogen positive metastatic breast cancer with a median survival around 5 years.  Treatment in the palliative setting was discussed. - As patient is currently on Arimidex for a brief period and tamoxifen prior to that, I have recommended fulvestrant with a CDK 4/6 inhibitor.  We talked about  starting her on Abemaciclib 150 mg twice daily on a continuous dosing. -We discussed the side effects of abemaciclib and fulvestrant in detail.  We will plan to repeat scans in 2-3 months to evaluate response.  She was told to use Imodium as needed for diarrhea.  We will also prescribe her Lomotil. -We will plan to send her tumor for foundation 1 testing to identify other mutations([id number]).  We will also obtain a baseline PET CT scan as she is complaining of left chest wall pain with some swelling.

## 2018-07-02 NOTE — Progress Notes (Signed)
[facility] CONSULT NOTE  Patient Care Team: Doree Albee, MD as PCP - General (Internal Medicine) Satira Sark, MD as PCP - Cardiology (Cardiology) Gala Romney Cristopher Estimable, MD as Consulting Physician (Gastroenterology)  CHIEF COMPLAINTS/PURPOSE OF CONSULTATION: Metastatic breast cancer to the liver  HISTORY OF PRESENTING ILLNESS:  Pamela Long 48 y.o. female is here because of newly diagnosed metastatic breast cancer to the liver. She was diagnosed with invasive lobular breast cancer in 2017, received neoadjuvant chemotherapy followed by mastectomy and antiestrogen therapy.  She recently switched to Arimidex in November of 2019 due to intolerability of the tamoxifen with constant hot flashes. In December she was sick and vomiting and that's when her abdominal pain started. She ended up in the ER where they did CT scans and found the liver mass. She had a liver biopsy at [facility] on 06/26/2018. Where she was diagnosed with metastatic breast cancer. She has had abdominal pain intermittently since December. She has also been more SOB and fatigued as well. She recently noticed fluid collecting under her left lateral axillary at her mastectomy scar. She denies any weight loss. Denies any nausea, vomiting, or diarrhea. Had not noticed any recent bleeding such as epistaxis, hematuria or hematochezia. Denies recent chest pain on exertion, pre-syncopal episodes, or palpitations. Denies any numbness or tingling in hands or feet. Denies any recent fevers, infections, or recent hospitalizations. Patient reports appetite at 50% and energy level at 50%.  She is full functioning mother of a 33 year old daughter and handles her own finances and ADLs. 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 She has  been at this job for a year. 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  She has an impressive family history of cancer. She has 2 maternal aunts with breast cancer. 1 Maternal uncle had lung cancer. 1 maternal [place] aunt with breast cancer. 1 Maternal cousin with cervical cancer.   In terms of breast cancer risk profile:  She menarched at early age of 34 and went to menopause at age 13 with a complete Hysterectomy.  She had 1 pregnancy, her first child was born at age 5. She did received birth control pills for approximately 20+ years.  She was never exposed to fertility medications or hormone replacement therapy.  She has a positive family history of Breast/GYN/GI cancer   MEDICAL HISTORY:  Past Medical History:  Diagnosis Date  . Anemia   . Anxiety   . Asthma   . Breast cancer ([place])    Left, 2017  . Depression   . GERD (gastroesophageal reflux disease)   . Migraine   . OAB (overactive bladder)   . Seasonal allergies     SURGICAL HISTORY: Past Surgical History:  Procedure Laterality Date  . ABDOMINAL HYSTERECTOMY     breast cancer  . ANKLE RECONSTRUCTION Right 1989  . BREAST SURGERY    . FOREIGN BODY REMOVAL N/A 08/29/2017   from lip  . KNEE SURGERY Right 1989   bone spur  . MASTECTOMY  2017   bil mastectomies  . SINUSOTOMY      SOCIAL HISTORY: Social History   Socioeconomic History  . Marital status: Single    Spouse name: Not on file  .  Number of children: 1  . Years of education: 74  . Highest education level: Not on file  Occupational History  . Occupation: disabled  Social Needs  . Financial resource strain: Somewhat hard  . Food insecurity:    Worry: Sometimes true    Inability: Sometimes true  . Transportation needs:    Medical: No    Non-medical: No  Tobacco Use  . Smoking status: Never Smoker  . Smokeless tobacco: Never Used  Substance and Sexual Activity  . Alcohol use: No  . Drug use: No  . Sexual activity: Not  Currently  Lifestyle  . Physical activity:    Days per week: 0 days    Minutes per session: 0 min  . Stress: Rather much  Relationships  . Social connections:    Talks on phone: Once a week    Gets together: Once a week    Attends religious service: 1 to 4 times per year    Active member of club or organization: Yes    Attends meetings of clubs or organizations: Never    Relationship status: Never married  . Intimate partner violence:    Fear of current or ex partner: No    Emotionally abused: No    Physically abused: No    Forced sexual activity: No  Other Topics Concern  . Not on file  Social History Narrative   Bachelors degree   Accounting   Lives with daughter [name] who has autism and diabetes   Likes to sew, quilt, crafts, crochet    FAMILY HISTORY: Family History  Problem Relation Age of Onset  . Arthritis Mother   . COPD Mother   . Depression Mother   . Diabetes Mother   . Kidney disease Father   . Heart disease Father 37  . Drug abuse Father   . Hypertension Father   . Diabetes Daughter   . Hashimoto's thyroiditis Daughter   . Irritable bowel syndrome Daughter   . Autism spectrum disorder Daughter   . Early death Maternal Grandmother        drowned  . Early death Maternal Grandfather   . Heart disease Maternal Grandfather   . Heart disease Paternal Grandfather   . Breast cancer Maternal Aunt   . Breast cancer Cousin   . Lung cancer Maternal Aunt   . Lung cancer Maternal Uncle   . Colon cancer Neg Hx     ALLERGIES:  is allergic to corn-containing products; tape; amoxicillin; caffeine; and tetanus toxoids.  MEDICATIONS:  Current Outpatient Medications  Medication Sig Dispense Refill  . anastrozole (ARIMIDEX) 1 MG tablet Take 1 mg by mouth at bedtime.    . Carboxymethylcellulose Sodium (REFRESH CELLUVISC OP) Place 1 drop into both eyes 3 (three) times daily as needed (for dry eyes.).    Marland Kitchen Cholecalciferol (VITAMIN D3) 5000 units TABS Take 5,000  Units by mouth every evening.     . donepezil (ARICEPT) 5 MG tablet Take 5 mg by mouth at bedtime.    Marland Kitchen escitalopram (LEXAPRO) 20 MG tablet Take 1 tablet (20 mg total) by mouth daily. 90 tablet 3  . NP THYROID 30 MG tablet Take 30 mg by mouth every morning.   3  . pantoprazole (PROTONIX) 40 MG tablet TAKE 1 TABLET BY MOUTH DAILY BEFORE BREAKFAST 90 tablet 1  . rizatriptan (MAXALT-MLT) 10 MG disintegrating tablet Take 10 mg by mouth every 2 (two) hours as needed for migraine (May repeat in 2 hours if needed).     Marland Kitchen  TROKENDI XR 50 MG CP24 Take 50 mg by mouth 2 (two) times daily.    Marland Kitchen abemaciclib (VERZENIO) 150 MG tablet Take 1 tablet (150 mg total) by mouth 2 (two) times daily. Swallow tablets whole. Do not chew, crush, or split tablets before swallowing. 60 tablet 0  . albuterol (PROVENTIL HFA;VENTOLIN HFA) 108 (90 Base) MCG/ACT inhaler Inhale 1 puff into the lungs every 4 (four) hours as needed for wheezing or shortness of breath. (Patient not taking: Reported on 07/02/2018) 18 g 1  . gabapentin (NEURONTIN) 300 MG capsule Take 600 mg by mouth at bedtime.     . naproxen sodium (ALEVE) 220 MG tablet Take 220-440 mg by mouth 2 (two) times daily as needed (pain.).    Marland Kitchen ondansetron (ZOFRAN) 4 MG tablet Take 1 tablet (4 mg total) by mouth every 8 (eight) hours as needed for nausea or vomiting. (Patient not taking: Reported on 07/02/2018) 15 tablet 0  . oxyCODONE-acetaminophen (PERCOCET/ROXICET) 5-325 MG tablet Take 1-2 tablets by mouth every 6 (six) hours as needed for severe pain. (Patient not taking: Reported on 07/02/2018) 15 tablet 0  . polyethylene glycol powder (GLYCOLAX/MIRALAX) powder Take one capful (17 grams) twice a day until soft bowel movement, then take one capful per day to keep stools soft as needed. (Patient not taking: Reported on 07/02/2018) 255 g 3   No current facility-administered medications for this visit.     REVIEW OF SYSTEMS:   Constitutional: Denies fevers, chills or abnormal  night sweats Eyes: Denies blurriness of vision, double vision or watery eyes Ears, nose, mouth, throat, and face: Denies mucositis or sore throat Respiratory: Denies cough, dyspnea or wheezes Cardiovascular: Denies palpitation, chest discomfort or lower extremity swelling Gastrointestinal:  Denies nausea, heartburn or change in bowel habits Skin: Denies abnormal skin rashes Lymphatics: Denies new lymphadenopathy or easy bruising Neurological:Denies numbness, tingling or new weaknesses Behavioral/Psych: Mood is stable, no new changes  All other systems were reviewed with the patient and are negative.  PHYSICAL EXAMINATION: ECOG PERFORMANCE STATUS: 1 - Symptomatic but completely ambulatory  Vitals:   07/02/18 1300  BP: 117/67  Pulse: 79  Resp: 16  Temp: 98 F (36.7 C)  SpO2: 98%   Filed Weights   07/02/18 1300  Weight: 207 lb (93.9 kg)    GENERAL:alert, no distress and comfortable SKIN: skin color, texture, turgor are normal, no rashes or significant lesions EYES: normal, conjunctiva are pink and non-injected, sclera clear OROPHARYNX:no exudate, no erythema and lips, buccal mucosa, and tongue normal  NECK: supple, thyroid normal size, non-tender, without nodularity LYMPH:  no palpable lymphadenopathy in the cervical, axillary or inguinal LUNGS: clear to auscultation and percussion with normal breathing effort HEART: regular rate & rhythm and no murmurs and no lower extremity edema ABDOMEN:abdomen soft, non-tender and normal bowel sounds Musculoskeletal:no cyanosis of digits and no clubbing  PSYCH: alert & oriented x 3 with fluent speech NEURO: no focal motor/sensory deficits - Bilateral mastectomy sites within normal limits.  There is some lymphedema in the left lateral chest wall. - Abdominal exam shows mass palpable in the epigastric and right upper quadrant.  LABORATORY DATA:  I have reviewed the data as listed Lab Results  Component Value Date   WBC 11.4 (H)  06/17/2018   HGB 12.3 06/17/2018   HCT 38.0 06/17/2018   MCV 90.7 06/17/2018   PLT 219 06/17/2018     Chemistry      Component Value Date/Time   NA 137 06/17/2018 1503   K  3.4 (L) 06/17/2018 1503   CL 104 06/17/2018 1503   CO2 24 06/17/2018 1503   BUN 18 06/17/2018 1503   CREATININE 0.94 06/17/2018 1503   CREATININE 0.82 04/01/2017 1054      Component Value Date/Time   CALCIUM 8.7 (L) 06/17/2018 1503   ALKPHOS 55 06/17/2018 1503   AST 91 (H) 06/17/2018 1503   ALT 57 (H) 06/17/2018 1503   BILITOT 0.7 06/17/2018 1503       RADIOGRAPHIC STUDIES: I have personally reviewed the radiological images as listed and agreed with the findings in the report. Dg Chest 2 View  Result Date: 06/17/2018 CLINICAL DATA:  Right upper quadrant pain for several days EXAM: CHEST - 2 VIEW COMPARISON:  None. FINDINGS: The heart size and mediastinal contours are within normal limits. Both lungs are clear. The visualized skeletal structures are unremarkable. IMPRESSION: No active cardiopulmonary disease. Electronically Signed   By: Inez Catalina M.D.   On: 06/17/2018 16:11   Ct Angio Chest Pe W/cm &/or Wo Cm  Result Date: 06/17/2018 CLINICAL DATA:  CLINICAL DATA Right upper abdominal pain for the last month, but worsening. History of breast cancer. Liver lesion on recent ultrasound EXAM: CT ANGIOGRAPHY CHEST CT ABDOMEN AND PELVIS WITH CONTRAST: TECHNIQUE: Multidetector CT imaging of the chest was performed using the standard protocol during bolus administration of intravenous contrast. Multiplanar CT image reconstructions and MIPs were obtained to evaluate the vascular anatomy. Multidetector CT imaging of the abdomen and pelvis was performed using the standard protocol during bolus administration of intravenous contrast. CONTRAST:  160m ISOVUE-370 IOPAMIDOL (ISOVUE-370) INJECTION 76% COMPARISON:  Ultrasound dated 06/17/2018 FINDINGS: CTA CHEST FINDINGS Cardiovascular: No filling defect is identified in the  pulmonary arterial tree to suggest pulmonary embolus. No acute vascular findings in the chest. Upper normal heart size. Mediastinum/Nodes: Left lower neck level IV lymph node 0.9 cm in short axis, image 8/4. Left internal mammary lymph node 0.8 cm in short axis, image 32/4. Lungs/Pleura: Unremarkable Musculoskeletal: Mild lower thoracic spondylosis. Review of the MIP images confirms the above findings. CT ABDOMEN and PELVIS FINDINGS Hepatobiliary: A mass primarily involving segments IVb, V, and VI in the liver measures 21.8 by 11.0 by 18.1 cm (volume = 2270 cm^3) demonstrates heterogeneous internal enhancement and areas of central necrosis. Highly suspicious appearance for malignancy. This bulges the liver surface and multiple locations. No separate hepatic lesions are identified. There is some extrinsic compression of the gallbladder due to the bulging contours of this mass. Pancreas: Unremarkable Spleen: Unremarkable Adrenals/Urinary Tract: Unremarkable Stomach/Bowel: There is some extrinsic narrowing of the gastric body and antrum due to the bulging mass from the liver. Normal appendix. Vascular/Lymphatic: Celiac node 1.2 cm in short axis on image 30/12. Left periaortic node 1.1 cm in short axis, image 40/12. There is mild extrinsic narrowing of the IVC due to bulging of the hepatic tumor margin for example on image 39/12. Reproductive: Uterus absent.  Adnexa unremarkable. Other: No supplemental non-categorized findings. Musculoskeletal: Unremarkable Review of the MIP images confirms the above findings. IMPRESSION: 1. Very large heterogeneously enhancing hepatic mass spanning across segments IV, V, and VI in the liver, and measuring up to 22 cm in long axis. Appearance strongly favors malignancy. Given that this is a solitary liver mass, the possibility of a primary liver tumor such as hepatocellular carcinoma or cholangiocarcinoma might be considered as an alternative to metastatic disease, although there are  also some enlarged lymph nodes in the chest and lower neck as well as the upper  abdomen. 2. Mildly enlarged left internal mammary node at 0.8 cm diameter. Borderline enlarged left lower neck level IV lymph node at 0.9 cm in short axis. Mildly enlarged celiac and left periaortic lymph nodes in the upper abdomen. 3. The liver mass causes some extrinsic narrowing of adjacent lesions because it bulges the liver capsule. There is some extrinsic narrowing of the gastric body and antrum as well as the IVC and gallbladder. Electronically Signed   By: Van Clines M.D.   On: 06/17/2018 17:34   Ct Abdomen Pelvis W Contrast  Result Date: 06/17/2018 CLINICAL DATA:  CLINICAL DATA Right upper abdominal pain for the last month, but worsening. History of breast cancer. Liver lesion on recent ultrasound EXAM: CT ANGIOGRAPHY CHEST CT ABDOMEN AND PELVIS WITH CONTRAST: TECHNIQUE: Multidetector CT imaging of the chest was performed using the standard protocol during bolus administration of intravenous contrast. Multiplanar CT image reconstructions and MIPs were obtained to evaluate the vascular anatomy. Multidetector CT imaging of the abdomen and pelvis was performed using the standard protocol during bolus administration of intravenous contrast. CONTRAST:  191m ISOVUE-370 IOPAMIDOL (ISOVUE-370) INJECTION 76% COMPARISON:  Ultrasound dated 06/17/2018 FINDINGS: CTA CHEST FINDINGS Cardiovascular: No filling defect is identified in the pulmonary arterial tree to suggest pulmonary embolus. No acute vascular findings in the chest. Upper normal heart size. Mediastinum/Nodes: Left lower neck level IV lymph node 0.9 cm in short axis, image 8/4. Left internal mammary lymph node 0.8 cm in short axis, image 32/4. Lungs/Pleura: Unremarkable Musculoskeletal: Mild lower thoracic spondylosis. Review of the MIP images confirms the above findings. CT ABDOMEN and PELVIS FINDINGS Hepatobiliary: A mass primarily involving segments IVb, V, and VI  in the liver measures 21.8 by 11.0 by 18.1 cm (volume = 2270 cm^3) demonstrates heterogeneous internal enhancement and areas of central necrosis. Highly suspicious appearance for malignancy. This bulges the liver surface and multiple locations. No separate hepatic lesions are identified. There is some extrinsic compression of the gallbladder due to the bulging contours of this mass. Pancreas: Unremarkable Spleen: Unremarkable Adrenals/Urinary Tract: Unremarkable Stomach/Bowel: There is some extrinsic narrowing of the gastric body and antrum due to the bulging mass from the liver. Normal appendix. Vascular/Lymphatic: Celiac node 1.2 cm in short axis on image 30/12. Left periaortic node 1.1 cm in short axis, image 40/12. There is mild extrinsic narrowing of the IVC due to bulging of the hepatic tumor margin for example on image 39/12. Reproductive: Uterus absent.  Adnexa unremarkable. Other: No supplemental non-categorized findings. Musculoskeletal: Unremarkable Review of the MIP images confirms the above findings. IMPRESSION: 1. Very large heterogeneously enhancing hepatic mass spanning across segments IV, V, and VI in the liver, and measuring up to 22 cm in long axis. Appearance strongly favors malignancy. Given that this is a solitary liver mass, the possibility of a primary liver tumor such as hepatocellular carcinoma or cholangiocarcinoma might be considered as an alternative to metastatic disease, although there are also some enlarged lymph nodes in the chest and lower neck as well as the upper abdomen. 2. Mildly enlarged left internal mammary node at 0.8 cm diameter. Borderline enlarged left lower neck level IV lymph node at 0.9 cm in short axis. Mildly enlarged celiac and left periaortic lymph nodes in the upper abdomen. 3. The liver mass causes some extrinsic narrowing of adjacent lesions because it bulges the liver capsule. There is some extrinsic narrowing of the gastric body and antrum as well as the IVC  and gallbladder. Electronically Signed   By: WThayer Jew  Janeece Fitting M.D.   On: 06/17/2018 17:34   US Abdomen Limited Ruq  Result Date: 06/17/2018 CLINICAL DATA:  Right upper quadrant pain for 1 month. History of breast cancer. EXAM: ULTRASOUND ABDOMEN LIMITED RIGHT UPPER QUADRANT COMPARISON:  None. FINDINGS: Gallbladder: No gallstones or wall thickening visualized. No sonographic Murphy sign noted by sonographer. Common bile duct: Diameter: 0.4 cm. Liver: The liver is heterogeneous. There appears to be a focal lesion in the left hepatic lobe measuring 11 x 15 x 19 cm. Portal vein is patent on color Doppler imaging with normal direction of blood flow towards the liver. IMPRESSION: Heterogeneous liver with a likely mass in the left hepatic lobe. Recommend CT abdomen and pelvis with contrast for further evaluation. Electronically Signed   By: Inge Rise M.D.   On: 06/17/2018 16:07   I have reviewed Francene Finders, NP's note and agree with the documentation.  I personally performed a face-to-face visit, made revisions and my assessment and plan is as follows.   ASSESSMENT & PLAN:  Metastatic breast cancer ([place]) 1.  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 -Underwent neoadjuvant chemotherapy with dose dense AC followed by Taxol from 07/14/2015 through 11/29/2015. - Left lumpectomy plus SLNB, consistent with infiltrating lobular carcinoma, pleomorphic features, ER/PR positive, HER-2 negative, 0 out of 3 lymph nodes positive. - Zoladex 3.6 mg IM monthly started in August 2017 followed by prophylactic right breast mastectomy plus completion of left breast simple mastectomy, radiation not indicated. -Zoladex stopped due to joint pain, restarted in October 2017, exemestane started in January 2018 - Exemestane and Zoladex held secondary to joint pains, TAH and BSO in April  2018, exemestane 25 mg daily -Moved to [place] from [place] in March 2018, and was seeing Dr. Zenovia Jordan at [place]. -Genetic testing with multicolor gene panel was negative. -In August 2018 she was switched to tamoxifen 20 mg daily.  In November 2019 she was switched to anastrozole and she took only for few weeks. -Patient started having epigastric and right upper quadrant pains in mid December 2019. -CT CAP on 06/17/2018 shows very large hepatic mass measuring 22 cm, mildly enlarged left internal mammary node at 0.8 cm, borderline enlarged left lower neck level 4 node at 0.9 cm. - Biopsy of the liver mass on 06/26/2018 showed breast cancer, ER/PR positive and HER-2 negative. -Brain MRI in January 2020 was also negative for metastatic disease. - We had a prolonged discussion about the normal prognosis of estrogen positive metastatic breast cancer with a median survival around 5 years.  Treatment in the palliative setting was discussed. - As patient is currently on Arimidex for a brief period and tamoxifen prior to that, I have recommended fulvestrant with a CDK 4/6 inhibitor.  We talked about starting her on Abemaciclib 150 mg twice daily on a continuous dosing. -We discussed the side effects of abemaciclib and fulvestrant in detail.  We will plan to repeat scans in 2-3 months to evaluate response.  She was told to use Imodium as needed for diarrhea.  We will also prescribe her Lomotil. -We will plan to send her tumor for foundation 1 testing to identify other mutations([id number]).  We will also obtain a baseline PET CT scan as she is complaining of left chest wall pain with some swelling.  Orders Placed This Encounter  Procedures  . NM PET Image Restag (PS) Skull Base To  Thigh    Standing Status:   Future    Standing Expiration Date:   07/02/2019    Order Specific Question:   ** REASON FOR EXAM (FREE TEXT)    Answer:   metastatic breast cancer    Order Specific Question:   If  indicated for the ordered procedure, I authorize the administration of a radiopharmaceutical per Radiology protocol    Answer:   Yes    Order Specific Question:   Is the patient pregnant?    Answer:   No    Order Specific Question:   Preferred imaging location?    Answer:   [facility]    Order Specific Question:   Radiology Contrast Protocol - do NOT remove file path    Answer:   [email or web address]    All questions were answered. The patient knows to call the clinic with any problems, questions or concerns.     Derek Jack, MD 07/02/2018 5:02 PM

## 2018-07-06 ENCOUNTER — Telehealth (HOSPITAL_COMMUNITY): Payer: Self-pay | Admitting: Pharmacist

## 2018-07-06 ENCOUNTER — Telehealth (HOSPITAL_COMMUNITY): Payer: Self-pay | Admitting: Pharmacy Technician

## 2018-07-06 DIAGNOSIS — C50919 Malignant neoplasm of unspecified site of unspecified female breast: Secondary | ICD-10-CM

## 2018-07-06 MED FILL — VERZENIO 150 MG TAB: 150 | 28 days supply | Qty: 56 | Fill #0

## 2018-07-06 NOTE — Telephone Encounter (Signed)
Oral Oncology Pharmacy Student Encounter  Received new prescription for Verzenio for the treatment of metastatic Breast cancer, ER+, PR+, HER2-,  in conjunction with fulvestrant, planned duration until disease progression or unacceptable toxicity.  CBC from 06/17/18 assessed. Prescription dose and frequency assessed.   Current medication list in Epic reviewed, no DDIs with Verzenio identified:  Prescription has been e-scribed to the [employer] for benefits analysis and approval.  Oral Oncology Clinic will continue to follow for insurance authorization, copayment issues, initial counseling and start date.  Ricardo Jericho, PharmD Candidate [facility] [phone]  07/06/2018 8:59 AM

## 2018-07-06 NOTE — Telephone Encounter (Signed)
Oral Chemotherapy Pharmacist Encounter  Patient will receive medication on 07/07/18. She will begin the Verzenio when she receives it.  Patient Education Photographer, Art gallery manager, spoke with patient for overview of new oral chemotherapy medication: Verzenio for the treatment of metastatic Breast cancer, ER+, PR+, HER2-, in conjunction with fulvestrant, planned duration until disease progression or unacceptable toxicity.  Pt is doing well. Counseled patient on administration, dosing, side effects, monitoring, drug-food interactions, safe handling, storage, and disposal. Patient will take 1 tablet (150 mg total) by mouth 2 (two) times daily.  Side effects include but not limited to: diarrhea, decreased WBC/Hgb/Plt, fatigue, and changes in renal/liver function. Instructed pt to use loperamide for diarrhea if needed. She stated she has loperamide on hand.    Reviewed with patient importance of keeping a medication schedule and plan for any missed doses.  Patient reported upcoming colonoscopy on 07/29/18. Instructed pt to speak with Dr. [name] at next visit to see if she will need to hold her Verzenio prior to the procedure.  Mrs. Seevers voiced understanding and appreciation. All questions answered. Medication handout placed in the mail.  Provided patient with [facility] phone number. Patient knows to call the office with questions or concerns. Oral Chemotherapy [facility] will continue to follow.  Education conducted in conjunction with [facility] Pharmacist.  Darl Pikes, PharmD, BCPS, [facility] Hematology/Oncology Clinical Pharmacist [facility] [phone]  07/06/2018 3:02 PM

## 2018-07-06 NOTE — Telephone Encounter (Signed)
Oral Oncology Patient Advocate Encounter  After completing a benefits investigation, prior authorization for Verzenio is not required at this time through [place].  Patient's copay is $8.00.  Scheduled delivery of Verzenio for 07/07/2018 from [facility].  [place] [facility] [place] Phone [phone] Fax [phone] 07/06/2018 3:09 PM

## 2018-07-07 ENCOUNTER — Ambulatory Visit: Payer: Managed Care, Other (non HMO) | Admitting: Gastroenterology

## 2018-07-07 NOTE — Telephone Encounter (Signed)
Spoke with patient and scheduled delivery of Verzenio for 07/07/2018.

## 2018-07-08 ENCOUNTER — Ambulatory Visit (INDEPENDENT_AMBULATORY_CARE_PROVIDER_SITE_OTHER): Payer: 59 | Admitting: Gastroenterology

## 2018-07-08 ENCOUNTER — Telehealth (HOSPITAL_COMMUNITY): Payer: Self-pay | Admitting: Emergency Medicine

## 2018-07-08 ENCOUNTER — Encounter: Payer: Self-pay | Admitting: Gastroenterology

## 2018-07-08 VITALS — BP 106/67 | HR 77 | Temp 98.8°F | Ht 65.5 in | Wt 205.6 lb

## 2018-07-08 DIAGNOSIS — K219 Gastro-esophageal reflux disease without esophagitis: Secondary | ICD-10-CM | POA: Diagnosis not present

## 2018-07-08 DIAGNOSIS — K625 Hemorrhage of anus and rectum: Secondary | ICD-10-CM

## 2018-07-08 DIAGNOSIS — R1011 Right upper quadrant pain: Secondary | ICD-10-CM | POA: Insufficient documentation

## 2018-07-08 NOTE — Patient Instructions (Signed)
We will touch base with you after you see her oncologist next week to discuss any potential changes in medications prior to your colonoscopy.   Continue pantoprazole once daily for heartburn. You can use TUMS, Tagamet or Pepcid as needed for breakthrough symptoms.   Monitor for worsening abdominal pain, fever, yellowing of the skin or eyes. If you notice these, go to the ER.   Please call if you have any questions or concerns.

## 2018-07-08 NOTE — Assessment & Plan Note (Signed)
Metastatic breast cancer with large hepatic mass causing compression of gallbladder, stomach, IVC. Increasing AST/ALT but normal bilirubin/alk phos. Discussed warning signs. Awaiting pending labs/PET.

## 2018-07-08 NOTE — Assessment & Plan Note (Signed)
Continue PPI. TUMS prn.

## 2018-07-08 NOTE — Assessment & Plan Note (Signed)
Intermittent rectal bleeding with previous constipation. She was seen previously and is currently on the schedule for colonoscopy. Since she was last seen she was diagnosed with large hepatic mass, liver biopsy confirmed metastatic breast cancer. LFTs are increasing as outlined. She has some lymphadenoma in the abdomen, chest, neck. PET scan scheduled for next week.   As discussed with patient today, she was encouraged to pursue colonoscopy as planned by her oncology clinic. I would recommend her discussing with them at [facility] next week to determine if there needs to be any hold on cancer medications prior to procedure etc. Will update Dr. Gala Romney on change in clinically status.

## 2018-07-08 NOTE — Telephone Encounter (Signed)
Pt called yesterday to state she received her medication Verzenio and will start taking it.

## 2018-07-08 NOTE — Progress Notes (Addendum)
REVIEWED-NO ADDITIONAL RECOMMENDATIONS.  Primary Care Physician: Doree Albee, MD  Primary Gastroenterologist:  Garfield Cornea, MD   Chief Complaint  Patient presents with  . Follow-up    needs to discuss recent diagnosis and date of colonoscopy    HPI: Pamela Long is a 48 y.o. female here for follow up to discuss upcoming colonoscopy. She was seen back in 03/2018 for rectal bleeding with intermittent constipation. She is scheduled for colonoscopy 07/29/2018. Moved here from [place] about one year ago. She was treated for left breast cancer 2017 while still in [place]. She received neoadjuvant chemo followed by mastectomy and antiestrogen therapy.    Recently diagnosed with metastatic breast cancer to the liver after presenting to ED with progressive RUQ pain, N/V in 06/2018. CT showed 21.8 X 11 X 18.1cm enhancing hepatic mass with some extrinsic compression of gb, stomach, IVC narrowing of adjacent. Liver biopsy performed at [facility] in [place] confirmed metastasis from breast cancer. While in ED on 06/17/2018 her AST was 91, ALT 57. Labs done 06/22/2018 with AST 168, ALT 126.   Recently transferred her oncology care from Dr. Laurance Flatten in [place] to [place] center. Started Verzenio 150mg  PO BID, Faslodex injections. PET scan planned 07/13/2018 and follow up labs/OV with oncology 07/16/2018. New lymphedema left axilla.   She reports her RUQ little better. Complains with early satiety and pp ruq pain. Has to eat very small amount at a time. Still having some heartburn on pantoprazole. Takes TUMS when needed. Some nausea since on cancer meds. Stools have changed. Feels constipated but has mushy stools. No recent bleeding in the past month or so.   Current Outpatient Medications  Medication Sig Dispense Refill  . abemaciclib (VERZENIO) 150 MG tablet Take 1 tablet (150 mg total) by mouth 2 (two) times daily. Swallow tablets whole. Do not chew, crush, or split tablets before swallowing. 60  tablet 0  . Carboxymethylcellulose Sodium (REFRESH CELLUVISC OP) Place 1 drop into both eyes 3 (three) times daily as needed (for dry eyes.).    Marland Kitchen Cholecalciferol (VITAMIN D3) 5000 units TABS Take 5,000 Units by mouth every evening.     . donepezil (ARICEPT) 5 MG tablet Take 5 mg by mouth at bedtime.    Marland Kitchen escitalopram (LEXAPRO) 20 MG tablet Take 1 tablet (20 mg total) by mouth daily. 90 tablet 3  . fulvestrant (FASLODEX) 250 MG/5ML injection Inject into the muscle every 30 (thirty) days. One injection each buttock over 1-2 minutes. Warm prior to use.  Now, taking once every 2 weeks    . gabapentin (NEURONTIN) 300 MG capsule Take 600 mg by mouth at bedtime.     . naproxen sodium (ALEVE) 220 MG tablet Take 220-440 mg by mouth 2 (two) times daily as needed (pain.).    Marland Kitchen NP THYROID 30 MG tablet Take 30 mg by mouth every morning.   3  . ondansetron (ZOFRAN) 4 MG tablet Take 1 tablet (4 mg total) by mouth every 8 (eight) hours as needed for nausea or vomiting. 15 tablet 0  . oxyCODONE-acetaminophen (PERCOCET/ROXICET) 5-325 MG tablet Take 1-2 tablets by mouth every 6 (six) hours as needed for severe pain. 15 tablet 0  . pantoprazole (PROTONIX) 40 MG tablet TAKE 1 TABLET BY MOUTH DAILY BEFORE BREAKFAST 90 tablet 1  . rizatriptan (MAXALT-MLT) 10 MG disintegrating tablet Take 10 mg by mouth every 2 (two) hours as needed for migraine (May repeat in 2 hours if needed).     Barbaraann Rondo  XR 50 MG CP24 Take 50 mg by mouth 2 (two) times daily.     No current facility-administered medications for this visit.     Allergies as of 07/08/2018 - Review Complete 07/08/2018  Allergen Reaction Noted  . Corn-containing products Other (See Comments) 08/29/2017  . Tape Itching 06/17/2018  . Amoxicillin Hives 02/12/2017  . Caffeine Diarrhea, Nausea Only, and Palpitations 01/16/2017  . Tetanus toxoids Swelling 02/12/2017   Past Medical History:  Diagnosis Date  . Anemia   . Anxiety   . Asthma   . Breast cancer  ([place])    Left, 2017  . Depression   . GERD (gastroesophageal reflux disease)   . Migraine   . OAB (overactive bladder)   . Seasonal allergies    Past Surgical History:  Procedure Laterality Date  . ABDOMINAL HYSTERECTOMY     breast cancer  . ANKLE RECONSTRUCTION Right 1989  . BREAST SURGERY    . FOREIGN BODY REMOVAL N/A 08/29/2017   from lip  . KNEE SURGERY Right 1989   bone spur  . MASTECTOMY  2017   bil mastectomies  . SINUSOTOMY     Family History  Problem Relation Age of Onset  . Arthritis Mother   . COPD Mother   . Depression Mother   . Diabetes Mother   . Kidney disease Father   . Heart disease Father 38  . Drug abuse Father   . Hypertension Father   . Diabetes Daughter   . Hashimoto's thyroiditis Daughter   . Irritable bowel syndrome Daughter   . Autism spectrum disorder Daughter   . Early death Maternal Grandmother        drowned  . Early death Maternal Grandfather   . Heart disease Maternal Grandfather   . Heart disease Paternal Grandfather   . Breast cancer Maternal Aunt   . Breast cancer Cousin   . Lung cancer Maternal Aunt   . Lung cancer Maternal Uncle   . Colon cancer Neg Hx    Social History   Tobacco Use  . Smoking status: Never Smoker  . Smokeless tobacco: Never Used  Substance Use Topics  . Alcohol use: No  . Drug use: No    ROS:  General: Negative for anorexia, fever, chills, fatigue, weakness. 10 pound weight loss in 3 months.  ENT: Negative for hoarseness, difficulty swallowing , nasal congestion. CV: Negative for chest pain, angina, palpitations, dyspnea on exertion, peripheral edema.  Respiratory: Negative for dyspnea at rest, dyspnea on exertion, cough, sputum, wheezing.  GI: See history of present illness. GU:  Negative for dysuria, hematuria, urinary incontinence, urinary frequency, nocturnal urination.  Endo: Negative for unusual weight change.    Physical Examination:   BP 106/67   Pulse 77   Temp 98.8 F (37.1 C)  (Oral)   Ht 5' 5.5" (1.664 m)   Wt 205 lb 9.6 oz (93.3 kg)   BMI 33.69 kg/m   General: Well-nourished, well-developed in no acute distress.  Eyes: No icterus. Mouth: Oropharyngeal mucosa moist and pink , no lesions erythema or exudate. Lungs: Clear to auscultation bilaterally.  Heart: Regular rate and rhythm, no murmurs rubs or gallops.  Abdomen: Bowel sounds are normal,fullness in epigastrium, hard, tender. Liver edge easily palpable. No rebound or guarding.    Extremities: No lower extremity edema. No clubbing or deformities. Neuro: Alert and oriented x 4   Skin: Warm and dry, no jaundice.   Psych: Alert and cooperative, normal mood and affect.  Labs:  See  hpi Imaging Studies: Dg Chest 2 View  Result Date: 06/17/2018 CLINICAL DATA:  Right upper quadrant pain for several days EXAM: CHEST - 2 VIEW COMPARISON:  None. FINDINGS: The heart size and mediastinal contours are within normal limits. Both lungs are clear. The visualized skeletal structures are unremarkable. IMPRESSION: No active cardiopulmonary disease. Electronically Signed   By: Inez Catalina M.D.   On: 06/17/2018 16:11   Ct Angio Chest Pe W/cm &/or Wo Cm  Result Date: 06/17/2018 CLINICAL DATA:  CLINICAL DATA Right upper abdominal pain for the last month, but worsening. History of breast cancer. Liver lesion on recent ultrasound EXAM: CT ANGIOGRAPHY CHEST CT ABDOMEN AND PELVIS WITH CONTRAST: TECHNIQUE: Multidetector CT imaging of the chest was performed using the standard protocol during bolus administration of intravenous contrast. Multiplanar CT image reconstructions and MIPs were obtained to evaluate the vascular anatomy. Multidetector CT imaging of the abdomen and pelvis was performed using the standard protocol during bolus administration of intravenous contrast. CONTRAST:  164mL ISOVUE-370 IOPAMIDOL (ISOVUE-370) INJECTION 76% COMPARISON:  Ultrasound dated 06/17/2018 FINDINGS: CTA CHEST FINDINGS Cardiovascular: No filling defect  is identified in the pulmonary arterial tree to suggest pulmonary embolus. No acute vascular findings in the chest. Upper normal heart size. Mediastinum/Nodes: Left lower neck level IV lymph node 0.9 cm in short axis, image 8/4. Left internal mammary lymph node 0.8 cm in short axis, image 32/4. Lungs/Pleura: Unremarkable Musculoskeletal: Mild lower thoracic spondylosis. Review of the MIP images confirms the above findings. CT ABDOMEN and PELVIS FINDINGS Hepatobiliary: A mass primarily involving segments IVb, V, and VI in the liver measures 21.8 by 11.0 by 18.1 cm (volume = 2270 cm^3) demonstrates heterogeneous internal enhancement and areas of central necrosis. Highly suspicious appearance for malignancy. This bulges the liver surface and multiple locations. No separate hepatic lesions are identified. There is some extrinsic compression of the gallbladder due to the bulging contours of this mass. Pancreas: Unremarkable Spleen: Unremarkable Adrenals/Urinary Tract: Unremarkable Stomach/Bowel: There is some extrinsic narrowing of the gastric body and antrum due to the bulging mass from the liver. Normal appendix. Vascular/Lymphatic: Celiac node 1.2 cm in short axis on image 30/12. Left periaortic node 1.1 cm in short axis, image 40/12. There is mild extrinsic narrowing of the IVC due to bulging of the hepatic tumor margin for example on image 39/12. Reproductive: Uterus absent.  Adnexa unremarkable. Other: No supplemental non-categorized findings. Musculoskeletal: Unremarkable Review of the MIP images confirms the above findings. IMPRESSION: 1. Very large heterogeneously enhancing hepatic mass spanning across segments IV, V, and VI in the liver, and measuring up to 22 cm in long axis. Appearance strongly favors malignancy. Given that this is a solitary liver mass, the possibility of a primary liver tumor such as hepatocellular carcinoma or cholangiocarcinoma might be considered as an alternative to metastatic disease,  although there are also some enlarged lymph nodes in the chest and lower neck as well as the upper abdomen. 2. Mildly enlarged left internal mammary node at 0.8 cm diameter. Borderline enlarged left lower neck level IV lymph node at 0.9 cm in short axis. Mildly enlarged celiac and left periaortic lymph nodes in the upper abdomen. 3. The liver mass causes some extrinsic narrowing of adjacent lesions because it bulges the liver capsule. There is some extrinsic narrowing of the gastric body and antrum as well as the IVC and gallbladder. Electronically Signed   By: Van Clines M.D.   On: 06/17/2018 17:34   Ct Abdomen Pelvis W Contrast  Result  Date: 06/17/2018 CLINICAL DATA:  CLINICAL DATA Right upper abdominal pain for the last month, but worsening. History of breast cancer. Liver lesion on recent ultrasound EXAM: CT ANGIOGRAPHY CHEST CT ABDOMEN AND PELVIS WITH CONTRAST: TECHNIQUE: Multidetector CT imaging of the chest was performed using the standard protocol during bolus administration of intravenous contrast. Multiplanar CT image reconstructions and MIPs were obtained to evaluate the vascular anatomy. Multidetector CT imaging of the abdomen and pelvis was performed using the standard protocol during bolus administration of intravenous contrast. CONTRAST:  166mL ISOVUE-370 IOPAMIDOL (ISOVUE-370) INJECTION 76% COMPARISON:  Ultrasound dated 06/17/2018 FINDINGS: CTA CHEST FINDINGS Cardiovascular: No filling defect is identified in the pulmonary arterial tree to suggest pulmonary embolus. No acute vascular findings in the chest. Upper normal heart size. Mediastinum/Nodes: Left lower neck level IV lymph node 0.9 cm in short axis, image 8/4. Left internal mammary lymph node 0.8 cm in short axis, image 32/4. Lungs/Pleura: Unremarkable Musculoskeletal: Mild lower thoracic spondylosis. Review of the MIP images confirms the above findings. CT ABDOMEN and PELVIS FINDINGS Hepatobiliary: A mass primarily involving  segments IVb, V, and VI in the liver measures 21.8 by 11.0 by 18.1 cm (volume = 2270 cm^3) demonstrates heterogeneous internal enhancement and areas of central necrosis. Highly suspicious appearance for malignancy. This bulges the liver surface and multiple locations. No separate hepatic lesions are identified. There is some extrinsic compression of the gallbladder due to the bulging contours of this mass. Pancreas: Unremarkable Spleen: Unremarkable Adrenals/Urinary Tract: Unremarkable Stomach/Bowel: There is some extrinsic narrowing of the gastric body and antrum due to the bulging mass from the liver. Normal appendix. Vascular/Lymphatic: Celiac node 1.2 cm in short axis on image 30/12. Left periaortic node 1.1 cm in short axis, image 40/12. There is mild extrinsic narrowing of the IVC due to bulging of the hepatic tumor margin for example on image 39/12. Reproductive: Uterus absent.  Adnexa unremarkable. Other: No supplemental non-categorized findings. Musculoskeletal: Unremarkable Review of the MIP images confirms the above findings. IMPRESSION: 1. Very large heterogeneously enhancing hepatic mass spanning across segments IV, V, and VI in the liver, and measuring up to 22 cm in long axis. Appearance strongly favors malignancy. Given that this is a solitary liver mass, the possibility of a primary liver tumor such as hepatocellular carcinoma or cholangiocarcinoma might be considered as an alternative to metastatic disease, although there are also some enlarged lymph nodes in the chest and lower neck as well as the upper abdomen. 2. Mildly enlarged left internal mammary node at 0.8 cm diameter. Borderline enlarged left lower neck level IV lymph node at 0.9 cm in short axis. Mildly enlarged celiac and left periaortic lymph nodes in the upper abdomen. 3. The liver mass causes some extrinsic narrowing of adjacent lesions because it bulges the liver capsule. There is some extrinsic narrowing of the gastric body and  antrum as well as the IVC and gallbladder. Electronically Signed   By: Van Clines M.D.   On: 06/17/2018 17:34   US Abdomen Limited Ruq  Result Date: 06/17/2018 CLINICAL DATA:  Right upper quadrant pain for 1 month. History of breast cancer. EXAM: ULTRASOUND ABDOMEN LIMITED RIGHT UPPER QUADRANT COMPARISON:  None. FINDINGS: Gallbladder: No gallstones or wall thickening visualized. No sonographic Murphy sign noted by sonographer. Common bile duct: Diameter: 0.4 cm. Liver: The liver is heterogeneous. There appears to be a focal lesion in the left hepatic lobe measuring 11 x 15 x 19 cm. Portal vein is patent on color Doppler imaging with normal direction  of blood flow towards the liver. IMPRESSION: Heterogeneous liver with a likely mass in the left hepatic lobe. Recommend CT abdomen and pelvis with contrast for further evaluation. Electronically Signed   By: Inge Rise M.D.   On: 06/17/2018 16:07

## 2018-07-09 ENCOUNTER — Telehealth: Payer: Self-pay | Admitting: *Deleted

## 2018-07-09 ENCOUNTER — Other Ambulatory Visit: Payer: Self-pay | Admitting: *Deleted

## 2018-07-09 ENCOUNTER — Ambulatory Visit: Payer: 59 | Admitting: Gastroenterology

## 2018-07-09 DIAGNOSIS — K625 Hemorrhage of anus and rectum: Secondary | ICD-10-CM

## 2018-07-09 DIAGNOSIS — K649 Unspecified hemorrhoids: Secondary | ICD-10-CM

## 2018-07-09 NOTE — Telephone Encounter (Signed)
Checked encounter form and it was for TCS w/ RMR. I called patient and made aware she needs deep sedation with propofol. She is now scheduled for 08/06/2018 at 1:30pm. Patient aware will mail new instructions and will let her know about pre-op.

## 2018-07-09 NOTE — Telephone Encounter (Signed)
-----   Message from Mahala Menghini, PA-C sent at 07/08/2018 10:33 PM EST ----- Hey. I saw this lady back in 03/2018. She was scheduled for TCS then, for 07/2018.   She came back today due to new metastatic breast cancer. I noticed that my note from 03/2018 said tcs with propofol but she is currently scheduled for conscious sedation. I don't know if I screwed up and didn't write it down right.   Could she be switched to propofol at this point and have timely tcs.

## 2018-07-09 NOTE — Telephone Encounter (Signed)
Patient called back and is aware.

## 2018-07-09 NOTE — Telephone Encounter (Signed)
Carolyn from endo called and stated patient will be a pre-op phone call on 07/31/2018. Called patient and LMOVM.

## 2018-07-13 ENCOUNTER — Encounter (HOSPITAL_COMMUNITY)
Admission: RE | Admit: 2018-07-13 | Discharge: 2018-07-13 | Disposition: A | Discharge status: Home / Self Care | Payer: 59 | Source: Ambulatory Visit | Attending: Nurse Practitioner | Admitting: Nurse Practitioner

## 2018-07-13 DIAGNOSIS — C50919 Malignant neoplasm of unspecified site of unspecified female breast: Secondary | ICD-10-CM | POA: Diagnosis not present

## 2018-07-13 LAB — GLUCOSE, CAPILLARY: Glucose-Capillary: 76 mg/dL (ref 70–99)

## 2018-07-13 IMAGING — CT NM PET TUM IMG RESTAG (PS) SKULL BASE T - THIGH
8 series · 25 of 25 positions shown · non-contrast
Comparison: CTs of the chest abdomen and pelvis [DATE]

CLINICAL DATA: Subsequent treatment strategy for metastatic breast
cancer. Large hepatic lesion on CT.

EXAM:
NUCLEAR MEDICINE PET SKULL BASE TO THIGH
TECHNIQUE: 10.3 mCi F-18 FDG was injected intravenously. Full-ring PET imaging
was performed from the skull base to thigh after the radiotracer. CT
data was obtained and used for attenuation correction and anatomic
localization.
Fasting blood glucose: 76 mg/dl

[Series 3: pet sk_thigh ac · axial · 5.0mm · 4.07mm/px · z∈[-831,+57]mm · 5 of 223 slices shown]
[im 1/223]
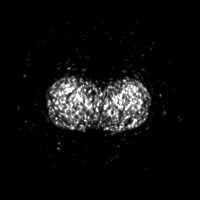
[im 56/223]
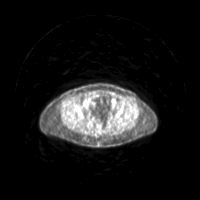
[im 112/223]
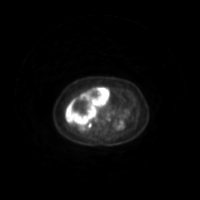
[im 167/223]
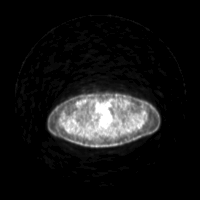
[im 223/223]
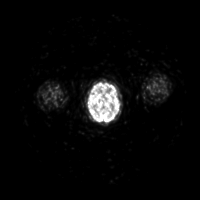

[Series 4: ct sk_thigh 5.0 hd_fov · axial · 5.0mm · 1.07mm/px · z∈[-831,+57]mm · 5 of 223 slices shown]
[im 1/223]
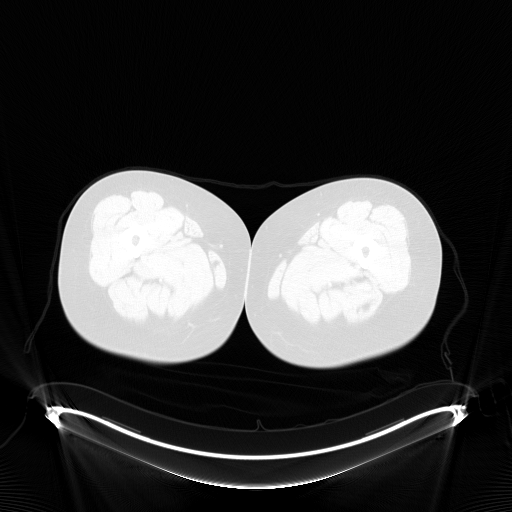
[im 56/223]
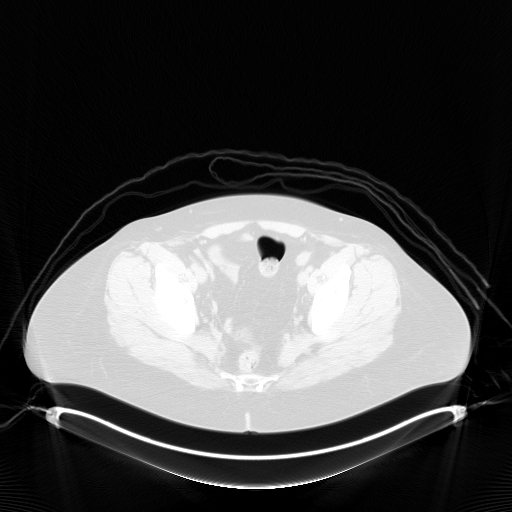
[im 112/223]
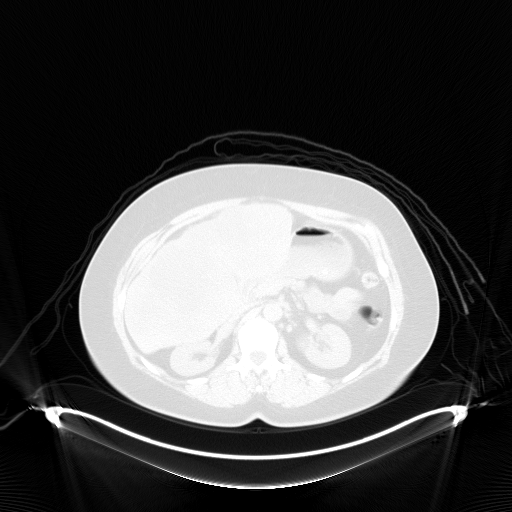
[im 167/223]
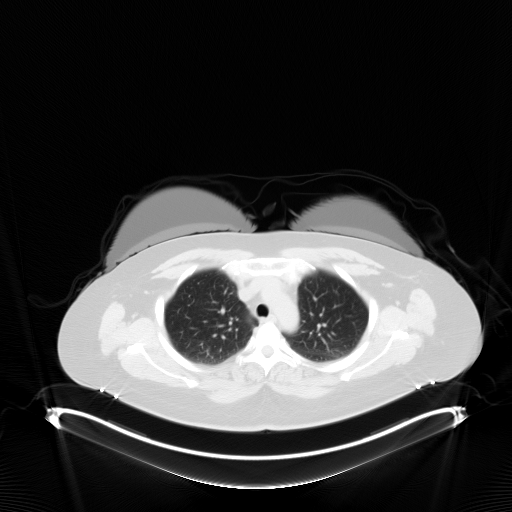
[im 223/223  brain]
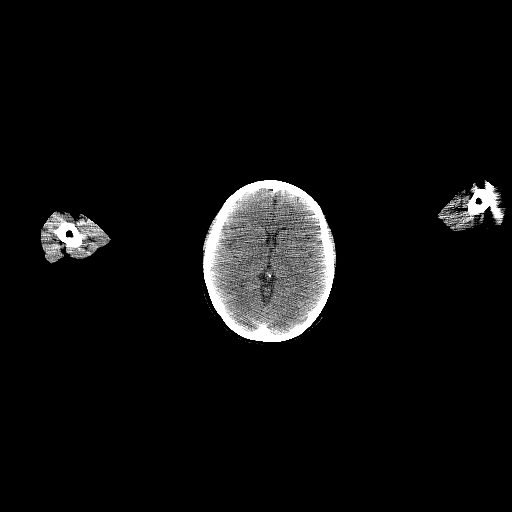

[Series 5: pet sk_thigh nac · axial · 5.0mm · 4.07mm/px · z∈[-831,+57]mm · 5 of 223 slices shown]
[im 1/223]
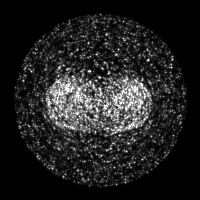
[im 56/223]
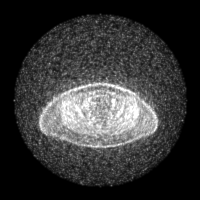
[im 112/223]
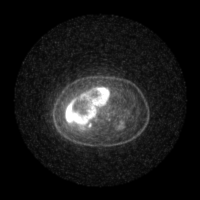
[im 167/223]
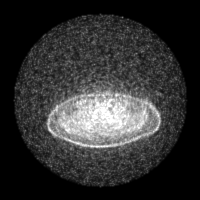
[im 223/223]
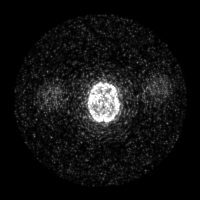

[Series 8: ct sk_thigh 5.0 b70f lung_bone · axial · 5.0mm · 0.68mm/px · 1 of 56 slices shown]
[im 1/56  bone]
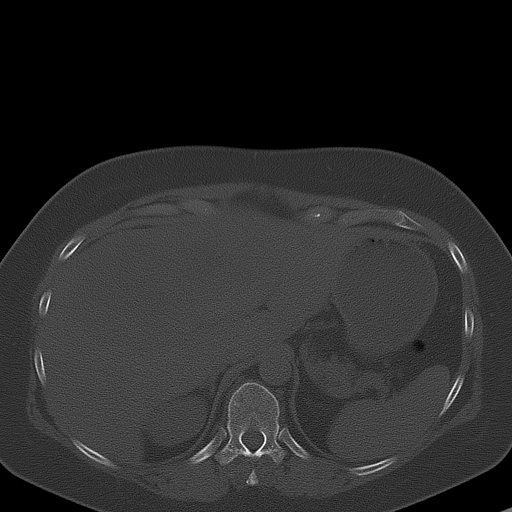

[Series 604: range-ct sk_thigh 5.0 hd_fov-cor-<alpha range> · 2 of 83 slices shown]
[im 1/83]
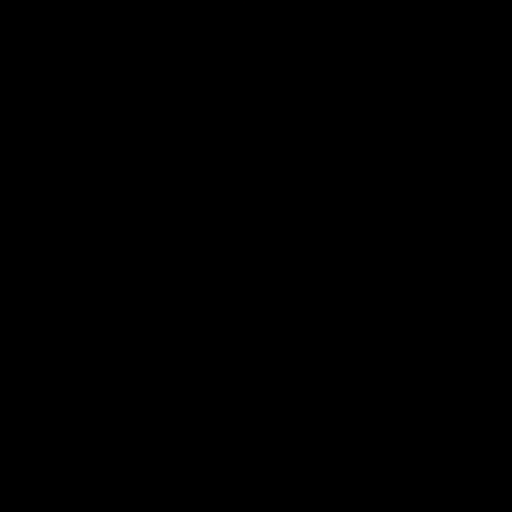
[im 83/83]
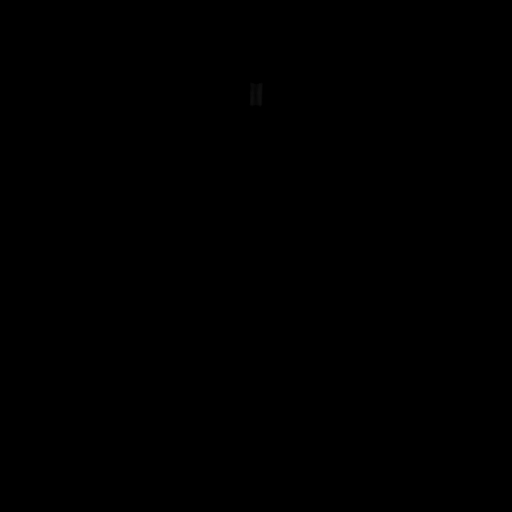

[Series 605: mip range · coronal · 1.84mm/px · 1 of 32 slices shown]
[im 1/32]
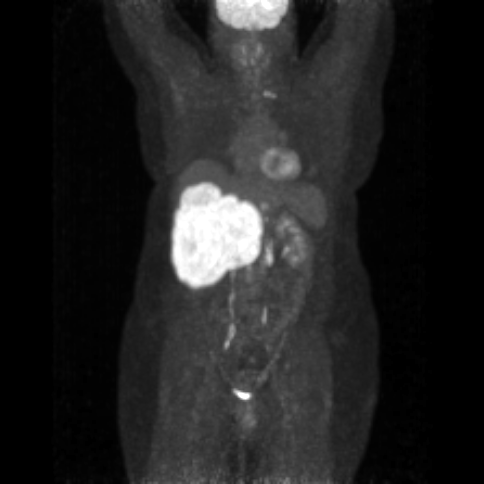

[Series 606: range-ct sk_thigh 5.0 hd_fov-tra-<alpha range> · 5 of 216 slices shown]
[im 1/216]
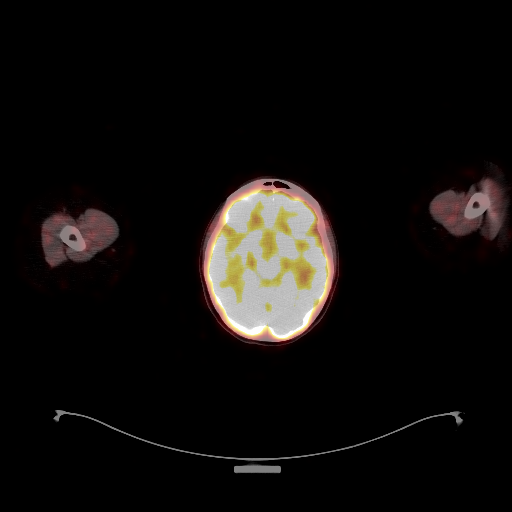
[im 54/216]
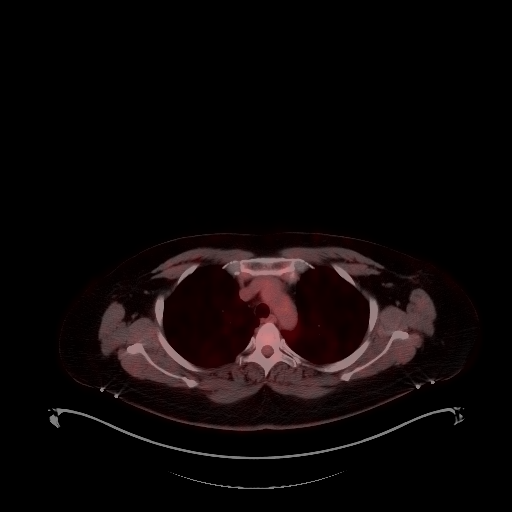
[im 108/216]
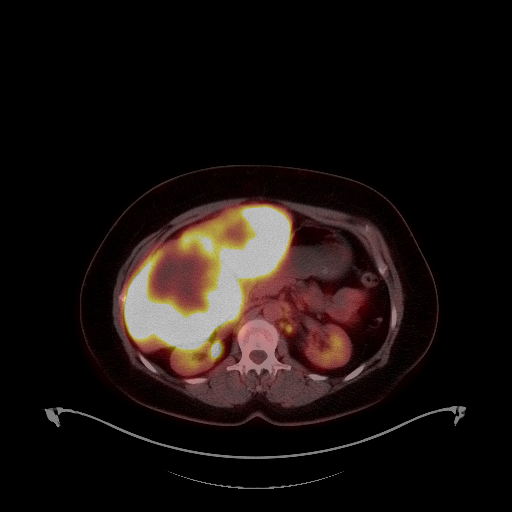
[im 162/216]
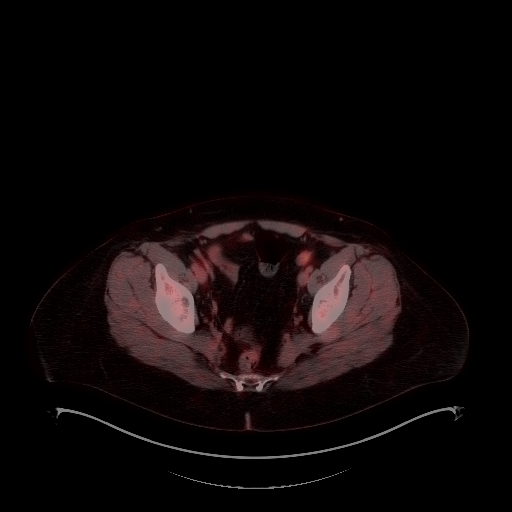
[im 216/216]
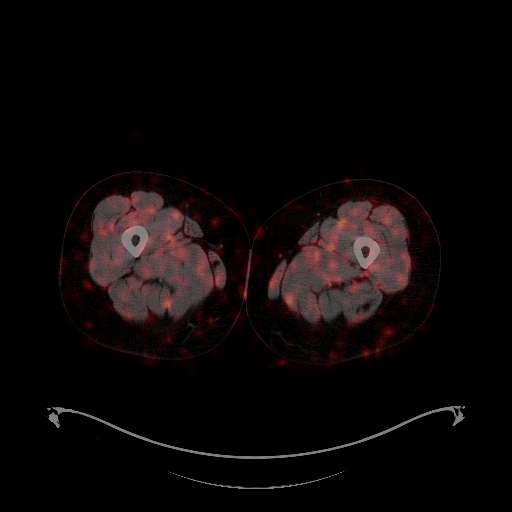

[Series 1072: results mm oncology reading · 5.0mm · 1.10mm/px · 1 of 5 slices shown]
[im 1/5]
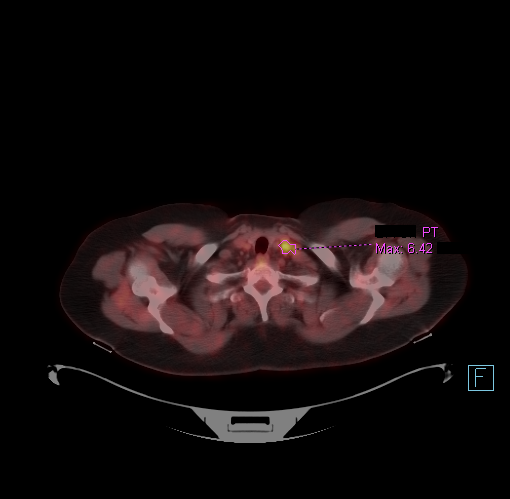

[25 of 25 positions shown; findings below may reference images not displayed]

FINDINGS: Mediastinal blood pool activity: SUV max

NECK:

As seen on prior chest CT, there are 2 adjacent left supraclavicular
lymph nodes which are mildly hypermetabolic. These both measure 8 mm
short axis on image 44/4 and have an SUV max of 6.4. No other
hypermetabolic cervical lymph nodes are identified.There are no
lesions of the pharyngeal mucosal space.

Incidental CT findings: none

CHEST:

There are no hypermetabolic mediastinal, hilar, axillary or internal
mammary lymph nodes. There is no abnormal metabolic activity in the
chest wall status post bilateral mastectomy. No suspicious pulmonary
activity.

Incidental CT findings: none

ABDOMEN/PELVIS:

The very large mass (spanning greater than 20 cm) involving the
right and left hepatic lobes demonstrates marked hypermetabolic
activity (SUV max 27.1). There is mild central necrosis of this
mass. There is a possible single hypermetabolic satellite lesion in
the lateral segment of the left hepatic lobe (SUV max 6.2). There is
no hypermetabolic activity within the spleen, pancreas or adrenal
glands. There are several hypermetabolic lymph nodes in the porta
hepatis and upper retroperitoneum. Left periaortic node measuring 11
mm short axis on image 120/4 has an SUV max of 14.2. There are no
hypermetabolic pelvic lymph nodes.

Incidental CT findings: None. No morphologic changes of hepatic
cirrhosis.

SKELETON:

There is no hypermetabolic activity to suggest osseous metastatic
disease.

Incidental CT findings: none
IMPRESSION: 1. The very large, centrally necrotic mass involving the right and
left hepatic lobes is hypermetabolic, consistent with malignancy.
Apart from this dominant mass, there is perhaps one other
hypermetabolic lesion in the left lobe.
2. Multiple hypermetabolic lymph nodes in the upper abdomen and left
supraclavicular space. No thoracic adenopathy, chest wall mass,
pulmonary or osseous lesions.
3. As previously noted, the possibility of a primary hepatic
malignancy should be considered as an alternative to metastatic
breast cancer. No underlying morphologic changes of cirrhosis are
identified. Tissue sampling recommended.

## 2018-07-13 MED ORDER — FLUDEOXYGLUCOSE F - 18 (FDG) INJECTION
10.3000 | Freq: Once | INTRAVENOUS | Status: AC | PRN
  Administered 2018-07-13: 10.3 via INTRAVENOUS

## 2018-07-13 NOTE — Progress Notes (Signed)
CC'D TO PCP °

## 2018-07-14 ENCOUNTER — Ambulatory Visit (HOSPITAL_COMMUNITY): Payer: 59 | Admitting: Hematology

## 2018-07-14 ENCOUNTER — Other Ambulatory Visit (HOSPITAL_COMMUNITY): Payer: 59

## 2018-07-15 ENCOUNTER — Other Ambulatory Visit (HOSPITAL_COMMUNITY): Payer: Self-pay | Admitting: Nurse Practitioner

## 2018-07-15 DIAGNOSIS — C50919 Malignant neoplasm of unspecified site of unspecified female breast: Secondary | ICD-10-CM

## 2018-07-16 ENCOUNTER — Encounter (HOSPITAL_COMMUNITY): Payer: Self-pay | Admitting: Hematology

## 2018-07-16 ENCOUNTER — Inpatient Hospital Stay (HOSPITAL_BASED_OUTPATIENT_CLINIC_OR_DEPARTMENT_OTHER): Payer: 59 | Admitting: Hematology

## 2018-07-16 ENCOUNTER — Inpatient Hospital Stay (HOSPITAL_COMMUNITY): Payer: 59 | Attending: Hematology

## 2018-07-16 ENCOUNTER — Ambulatory Visit (HOSPITAL_COMMUNITY): Payer: 59

## 2018-07-16 ENCOUNTER — Inpatient Hospital Stay (HOSPITAL_COMMUNITY): Payer: 59

## 2018-07-16 VITALS — BP 104/77 | HR 80 | Temp 99.0°F | Resp 16 | Wt 205.0 lb

## 2018-07-16 DIAGNOSIS — R1012 Left upper quadrant pain: Secondary | ICD-10-CM

## 2018-07-16 DIAGNOSIS — C50919 Malignant neoplasm of unspecified site of unspecified female breast: Secondary | ICD-10-CM

## 2018-07-16 DIAGNOSIS — Z79811 Long term (current) use of aromatase inhibitors: Secondary | ICD-10-CM | POA: Insufficient documentation

## 2018-07-16 DIAGNOSIS — Z79818 Long term (current) use of other agents affecting estrogen receptors and estrogen levels: Secondary | ICD-10-CM | POA: Diagnosis not present

## 2018-07-16 DIAGNOSIS — Z170 Estrogen receptor positive status [ER+]: Principal | ICD-10-CM

## 2018-07-16 DIAGNOSIS — R5383 Other fatigue: Secondary | ICD-10-CM

## 2018-07-16 DIAGNOSIS — C50112 Malignant neoplasm of central portion of left female breast: Secondary | ICD-10-CM

## 2018-07-16 DIAGNOSIS — M25551 Pain in right hip: Secondary | ICD-10-CM | POA: Diagnosis not present

## 2018-07-16 DIAGNOSIS — Z800 Family history of malignant neoplasm of digestive organs: Secondary | ICD-10-CM | POA: Diagnosis not present

## 2018-07-16 DIAGNOSIS — F329 Major depressive disorder, single episode, unspecified: Secondary | ICD-10-CM

## 2018-07-16 DIAGNOSIS — F419 Anxiety disorder, unspecified: Secondary | ICD-10-CM | POA: Insufficient documentation

## 2018-07-16 DIAGNOSIS — C50912 Malignant neoplasm of unspecified site of left female breast: Secondary | ICD-10-CM | POA: Diagnosis not present

## 2018-07-16 DIAGNOSIS — R509 Fever, unspecified: Secondary | ICD-10-CM | POA: Insufficient documentation

## 2018-07-16 DIAGNOSIS — M25552 Pain in left hip: Secondary | ICD-10-CM

## 2018-07-16 DIAGNOSIS — K219 Gastro-esophageal reflux disease without esophagitis: Secondary | ICD-10-CM | POA: Diagnosis not present

## 2018-07-16 DIAGNOSIS — Z79899 Other long term (current) drug therapy: Secondary | ICD-10-CM | POA: Diagnosis not present

## 2018-07-16 DIAGNOSIS — R630 Anorexia: Secondary | ICD-10-CM | POA: Insufficient documentation

## 2018-07-16 DIAGNOSIS — C787 Secondary malignant neoplasm of liver and intrahepatic bile duct: Secondary | ICD-10-CM | POA: Diagnosis not present

## 2018-07-16 DIAGNOSIS — Z801 Family history of malignant neoplasm of trachea, bronchus and lung: Secondary | ICD-10-CM | POA: Insufficient documentation

## 2018-07-16 DIAGNOSIS — R7989 Other specified abnormal findings of blood chemistry: Secondary | ICD-10-CM | POA: Diagnosis not present

## 2018-07-16 DIAGNOSIS — D649 Anemia, unspecified: Secondary | ICD-10-CM | POA: Insufficient documentation

## 2018-07-16 DIAGNOSIS — Z803 Family history of malignant neoplasm of breast: Secondary | ICD-10-CM | POA: Diagnosis not present

## 2018-07-16 DIAGNOSIS — J45909 Unspecified asthma, uncomplicated: Secondary | ICD-10-CM | POA: Diagnosis not present

## 2018-07-16 LAB — CBC WITH DIFFERENTIAL/PLATELET
ABS IMMATURE GRANULOCYTES: 0.02 10*3/uL (ref 0.00–0.07)
Basophils Absolute: 0.1 10*3/uL (ref 0.0–0.1)
Basophils Relative: 1 %
Eosinophils Absolute: 0.2 10*3/uL (ref 0.0–0.5)
Eosinophils Relative: 3 %
HCT: 38.8 % (ref 36.0–46.0)
Hemoglobin: 12.1 g/dL (ref 12.0–15.0)
Immature Granulocytes: 0 %
LYMPHS ABS: 1 10*3/uL (ref 0.7–4.0)
Lymphocytes Relative: 17 %
MCH: 28.1 pg (ref 26.0–34.0)
MCHC: 31.2 g/dL (ref 30.0–36.0)
MCV: 90.2 fL (ref 80.0–100.0)
Monocytes Absolute: 0.3 10*3/uL (ref 0.1–1.0)
Monocytes Relative: 5 %
Neutro Abs: 4.3 10*3/uL (ref 1.7–7.7)
Neutrophils Relative %: 74 %
Platelets: 174 10*3/uL (ref 150–400)
RBC: 4.3 MIL/uL (ref 3.87–5.11)
RDW: 13 % (ref 11.5–15.5)
WBC: 5.8 10*3/uL (ref 4.0–10.5)
nRBC: 0 % (ref 0.0–0.2)

## 2018-07-16 LAB — COMPREHENSIVE METABOLIC PANEL
ALBUMIN: 3.9 g/dL (ref 3.5–5.0)
ALT: 32 U/L (ref 0–44)
ANION GAP: 9 (ref 5–15)
AST: 101 U/L — ABNORMAL HIGH (ref 15–41)
Alkaline Phosphatase: 63 U/L (ref 38–126)
BUN: 27 mg/dL — AB (ref 6–20)
CO2: 23 mmol/L (ref 22–32)
Calcium: 9.6 mg/dL (ref 8.9–10.3)
Chloride: 107 mmol/L (ref 98–111)
Creatinine, Ser: 1.56 mg/dL — ABNORMAL HIGH (ref 0.44–1.00)
GFR calc Af Amer: 45 mL/min — ABNORMAL LOW (ref >60)
GFR calc non Af Amer: 39 mL/min — ABNORMAL LOW (ref >60)
GLUCOSE: 104 mg/dL — AB (ref 70–99)
Potassium: 4.3 mmol/L (ref 3.5–5.1)
Sodium: 139 mmol/L (ref 135–145)
Total Bilirubin: 0.3 mg/dL (ref 0.3–1.2)
Total Protein: 8.2 g/dL — ABNORMAL HIGH (ref 6.5–8.1)

## 2018-07-16 MED ORDER — OXYCODONE HCL 5 MG PO TABS: 5.0000 mg | ORAL_TABLET | Freq: Three times a day (TID) | ORAL | 0 refills | Status: DC | PRN

## 2018-07-16 MED ORDER — FULVESTRANT 250 MG/5ML IM SOLN
500.0000 mg | Freq: Once | INTRAMUSCULAR | Status: AC
  Administered 2018-07-16: 500 mg via INTRAMUSCULAR
  Filled 2018-07-16: qty 10

## 2018-07-16 NOTE — Progress Notes (Signed)
Pt here today for Faslodex injections. Pt given injections in bilateral upper outer quadrants. Pt tolerated injections well with no complaints. Pt stable and discharged home ambulatory. Pt to return in 2 weeks for next loading dose Faslodex.

## 2018-07-16 NOTE — Patient Instructions (Signed)
[facility] at [facility] Discharge Instructions     Thank you for choosing  Cancer Center at [facility] to provide your oncology and hematology care.  To afford each patient quality time with our provider, please arrive at least 15 minutes before your scheduled appointment time.   If you have a lab appointment with the Cancer Center please come in thru the  Main Entrance and check in at the main information desk  You need to re-schedule your appointment should you arrive 10 or more minutes late.  We strive to give you quality time with our providers, and arriving late affects you and other patients whose appointments are after yours.  Also, if you no show three or more times for appointments you may be dismissed from the clinic at the providers discretion.     Again, thank you for choosing [facility].  Our hope is that these requests will decrease the amount of time that you wait before being seen by our physicians.       _____________________________________________________________  Should you have questions after your visit to [facility], please contact our office at [phone] between the hours of 8:00 a.m. and 4:30 p.m.  Voicemails left after 4:00 p.m. will not be returned until the following business day.  For prescription refill requests, have your pharmacy contact our office and allow 72 hours.    Cancer Center Support Programs:   > Cancer Support Group  2nd Tuesday of the month 1pm-2pm, Journey Room    

## 2018-07-16 NOTE — Progress Notes (Signed)
[address], [place] [address]   CLINIC:  Medical Oncology/Hematology  PCP:  Doree Albee, MD [place] [address] [phone]   REASON FOR VISIT: Follow-up for Metastatic breast cancer to the liver   CURRENT THERAPY: abemaciclib    INTERVAL HISTORY:  Pamela Long 48 y.o. female returns for routine follow-up for metastatic breast cancer to the liver. She is having sharp pain on the left upper quadrant. She is also having joint pain that has worsened. She is fatigued during the day has no energy to do any daily activities. She is also running a low grade fever in the evenings. Her appetite is decreased some due to nausea. Denies any vomiting or diarrhea. Had not noticed any recent bleeding such as epistaxis, hematuria or hematochezia. Denies recent chest pain on exertion, shortness of breath on minimal exertion, pre-syncopal episodes, or palpitations. Denies any numbness or tingling in hands or feet. Denies any recent fevers, infections, or recent hospitalizations. Patient reports appetite at 50% and energy level at 50%.   REVIEW OF SYSTEMS:  Review of Systems  Constitutional: Positive for fatigue and fever.  Gastrointestinal: Positive for constipation and nausea.  Musculoskeletal: Positive for arthralgias.  Neurological: Positive for dizziness.  All other systems reviewed and are negative.    PAST MEDICAL/SURGICAL HISTORY:  Past Medical History:  Diagnosis Date  . Anemia   . Anxiety   . Asthma   . Breast cancer ([place])    Left, 2017  . Depression   . GERD (gastroesophageal reflux disease)   . Migraine   . OAB (overactive bladder)   . Seasonal allergies    Past Surgical History:  Procedure Laterality Date  . ABDOMINAL HYSTERECTOMY     breast cancer  . ANKLE RECONSTRUCTION Right 1989  . BREAST SURGERY    . FOREIGN BODY REMOVAL N/A 08/29/2017   from lip  . KNEE SURGERY Right 1989   bone spur  . MASTECTOMY  2017     bil mastectomies  . SINUSOTOMY       SOCIAL HISTORY:  Social History   Socioeconomic History  . Marital status: Single    Spouse name: Not on file  . Number of children: 1  . Years of education: 30  . Highest education level: Not on file  Occupational History  . Occupation: disabled  Social Needs  . Financial resource strain: Somewhat hard  . Food insecurity:    Worry: Sometimes true    Inability: Sometimes true  . Transportation needs:    Medical: No    Non-medical: No  Tobacco Use  . Smoking status: Never Smoker  . Smokeless tobacco: Never Used  Substance and Sexual Activity  . Alcohol use: No  . Drug use: No  . Sexual activity: Not Currently  Lifestyle  . Physical activity:    Days per week: 0 days    Minutes per session: 0 min  . Stress: Rather much  Relationships  . Social connections:    Talks on phone: Once a week    Gets together: Once a week    Attends religious service: 1 to 4 times per year    Active member of club or organization: Yes    Attends meetings of clubs or organizations: Never    Relationship status: Never married  . Intimate partner violence:    Fear of current or ex partner: No    Emotionally abused: No    Physically abused: No  Forced sexual activity: No  Other Topics Concern  . Not on file  Social History Narrative   Bachelors degree   Accounting   Lives with daughter [name] who has autism and diabetes   Likes to sew, quilt, crafts, crochet    FAMILY HISTORY:  Family History  Problem Relation Age of Onset  . Arthritis Mother   . COPD Mother   . Depression Mother   . Diabetes Mother   . Kidney disease Father   . Heart disease Father 17  . Drug abuse Father   . Hypertension Father   . Diabetes Daughter   . Hashimoto's thyroiditis Daughter   . Irritable bowel syndrome Daughter   . Autism spectrum disorder Daughter   . Early death Maternal Grandmother        drowned  . Early death Maternal Grandfather   . Heart  disease Maternal Grandfather   . Heart disease Paternal Grandfather   . Breast cancer Maternal Aunt   . Breast cancer Cousin   . Lung cancer Maternal Aunt   . Lung cancer Maternal Uncle   . Colon cancer Neg Hx     CURRENT MEDICATIONS:  Outpatient Encounter Medications as of 07/16/2018  Medication Sig Note  . abemaciclib (VERZENIO) 150 MG tablet Take 1 tablet (150 mg total) by mouth 2 (two) times daily. Swallow tablets whole. Do not chew, crush, or split tablets before swallowing.   . benzonatate (TESSALON) 100 MG capsule    . Carboxymethylcellulose Sodium (REFRESH CELLUVISC OP) Place 1 drop into both eyes 3 (three) times daily as needed (for dry eyes.).   Marland Kitchen Cholecalciferol (VITAMIN D3) 5000 units TABS Take 5,000 Units by mouth every evening.    . escitalopram (LEXAPRO) 20 MG tablet Take 1 tablet (20 mg total) by mouth daily.   . fulvestrant (FASLODEX) 250 MG/5ML injection Inject into the muscle every 30 (thirty) days. One injection each buttock over 1-2 minutes. Warm prior to use.  Now, taking once every 2 weeks   . naproxen sodium (ALEVE) 220 MG tablet Take 220-440 mg by mouth 2 (two) times daily as needed (pain.).   Marland Kitchen oxyCODONE-acetaminophen (PERCOCET/ROXICET) 5-325 MG tablet Take 1-2 tablets by mouth every 6 (six) hours as needed for severe pain.   . pantoprazole (PROTONIX) 40 MG tablet TAKE 1 TABLET BY MOUTH DAILY BEFORE BREAKFAST   . rizatriptan (MAXALT-MLT) 10 MG disintegrating tablet Take 10 mg by mouth every 2 (two) hours as needed for migraine (May repeat in 2 hours if needed).    . TROKENDI XR 50 MG CP24 Take 50 mg by mouth 2 (two) times daily.   Marland Kitchen donepezil (ARICEPT) 5 MG tablet Take 5 mg by mouth at bedtime.   . gabapentin (NEURONTIN) 300 MG capsule Take 600 mg by mouth at bedtime.  05/25/2018: On hold per patient   . NP THYROID 30 MG tablet Take 30 mg by mouth every morning.    . ondansetron (ZOFRAN) 4 MG tablet Take 1 tablet (4 mg total) by mouth every 8 (eight) hours as  needed for nausea or vomiting. (Patient not taking: Reported on 07/16/2018)   . oxyCODONE (OXY IR/ROXICODONE) 5 MG immediate release tablet Take 1 tablet (5 mg total) by mouth every 8 (eight) hours as needed for severe pain.   . [DISCONTINUED] VERZENIO 150 MG tablet    . [EXPIRED] fulvestrant (FASLODEX) injection 500 mg     No facility-administered encounter medications on file as of 07/16/2018.     ALLERGIES:  Allergies  Allergen Reactions  . Corn-Containing Products Other (See Comments)    Headache and GI upset  . Tape Itching    Depending on the adhesive-blistering occurs  . Amoxicillin Hives    Rash only DID THE REACTION INVOLVE: Swelling of the face/tongue/throat, SOB, or low BP? Sudden or severe rash/hives, skin peeling, or the inside of the mouth or nose?  Did it require medical treatment?  When did it last happen? If all above answers are "NO", may proceed with cephalosporin use.   . Caffeine Diarrhea, Nausea Only and Palpitations    Other reaction(s): Headache  . Tetanus Toxoids Swelling    Local reaction     PHYSICAL EXAM:  ECOG Performance status: 1  Vitals:   07/16/18 0908  BP: 104/77  Pulse: 80  Resp: 16  Temp: 99 F (37.2 C)  SpO2: 96%   Filed Weights   07/16/18 0908  Weight: 205 lb (93 kg)    Physical Exam Constitutional:      Appearance: Normal appearance. She is normal weight.  Cardiovascular:     Rate and Rhythm: Normal rate and regular rhythm.     Heart sounds: Normal heart sounds.  Pulmonary:     Effort: Pulmonary effort is normal.     Breath sounds: Normal breath sounds.  Abdominal:     General: Abdomen is flat.     Palpations: Abdomen is soft.  Musculoskeletal: Normal range of motion.  Skin:    General: Skin is warm and dry.  Neurological:     Mental Status: She is alert and oriented to person, place, and time. Mental status is at baseline.  Psychiatric:        Mood and Affect: Mood normal.        Behavior: Behavior normal.          Thought Content: Thought content normal.        Judgment: Judgment normal.      LABORATORY DATA:  I have reviewed the labs as listed.  CBC    Component Value Date/Time   WBC 5.8 07/16/2018 0816   RBC 4.30 07/16/2018 0816   HGB 12.1 07/16/2018 0816   HCT 38.8 07/16/2018 0816   PLT 174 07/16/2018 0816   MCV 90.2 07/16/2018 0816   MCH 28.1 07/16/2018 0816   MCHC 31.2 07/16/2018 0816   RDW 13.0 07/16/2018 0816   LYMPHSABS 1.0 07/16/2018 0816   MONOABS 0.3 07/16/2018 0816   EOSABS 0.2 07/16/2018 0816   BASOSABS 0.1 07/16/2018 0816   CMP Latest Ref Rng & Units 07/16/2018 06/17/2018 04/01/2017  Glucose 70 - 99 mg/dL 104(H) 85 90  BUN 6 - 20 mg/dL 27(H) 18 16  Creatinine 0.44 - 1.00 mg/dL 1.56(H) 0.94 0.82  Sodium 135 - 145 mmol/L 139 137 139  Potassium 3.5 - 5.1 mmol/L 4.3 3.4(L) 4.1  Chloride 98 - 111 mmol/L 107 104 107  CO2 22 - 32 mmol/L _0 Calcium 8.9 - 10.3 mg/dL 9.6 8.7(L) 8.7  Total Protein 6.5 - 8.1 g/dL 8.2(H) 7.3 6.4  Total Bilirubin 0.3 - 1.2 mg/dL 0.3 0.7 0.4  Alkaline Phos 38 - 126 U/L 63 55 -  AST 15 - 41 U/L 101(H) 91(H) 13  ALT 0 - 44 U/L 32 57(H) 13       DIAGNOSTIC IMAGING:  I have independently reviewed the scans and discussed with the patient.   I have reviewed Pamela Finders, NP's note and agree with the documentation.  I personally performed a face-to-face visit,  made revisions and my assessment and plan is as follows.    ASSESSMENT & PLAN:   Metastatic breast cancer ([place]) 1.  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 -Underwent neoadjuvant chemotherapy with dose dense AC followed by Taxol from 07/14/2015 through 11/29/2015. - Left lumpectomy plus SLNB, consistent with infiltrating lobular carcinoma, pleomorphic features, ER/PR positive, HER-2 negative, 0 out of 3 lymph nodes positive. - Zoladex 3.6  mg IM monthly started in August 2017 followed by prophylactic right breast mastectomy plus completion of left breast simple mastectomy, radiation not indicated. -Zoladex stopped due to joint pain, restarted in October 2017, exemestane started in January 2018 - Exemestane and Zoladex held secondary to joint pains, TAH and BSO in April 2018, exemestane 25 mg daily -Moved to [place] from [place] in March 2018, and was seeing Dr. Zenovia Jordan at [facility]. -Genetic testing with multicolor gene panel was negative. -In August 2018 she was switched to tamoxifen 20 mg daily.  In November 2019 she was switched to anastrozole and she took only for few weeks. -Patient started having epigastric and right upper quadrant pains in mid December 2019. -CT CAP on 06/17/2018 shows very large hepatic mass measuring 22 cm, mildly enlarged left internal mammary node at 0.8 cm, borderline enlarged left lower neck level 4 node at 0.9 cm. - Biopsy of the liver mass on 06/26/2018 showed breast cancer, ER/PR positive and HER-2 negative. -Brain MRI in January 2020 was also negative for metastatic disease. - We had a prolonged discussion about the normal prognosis of estrogen positive metastatic breast cancer with a median survival around 5 years.  Treatment in the palliative setting was discussed. - As patient is currently on Arimidex for a brief period and tamoxifen prior to that, I have recommended fulvestrant with a CDK 4/6 inhibitor.  We talked about starting her on Abemaciclib 150 mg twice daily on a continuous dosing. -She was started on Faslodex on 07/02/2018.  She has experienced joint pains which have slightly worsened since then.  She is taking Aleve and oxycodone as needed.  The joint pains are mostly in the hips and lower back. -She started Verzenio 150 mg twice daily on 07/07/2018. -She had one episode of diarrhea.  She had to take Imodium for it.  Otherwise she denies any diarrhea. -We will plan to send her  tumor for foundation 1 testing to identify other mutations(PIK3CA). - We discussed the results of the PET CT scan dated 07/13/2018 which shows very large centrally necrotic mass involving the right and left hepatic lobes, one other hypermetabolic lesion in the left lobe.  Multiple hypermetabolic lymph nodes in the upper abdomen, left supraclavicular space.  No thoracic adenopathy, chest wall mass, pulmonary or osseous lesions. - She does report some left chest wall pain mostly when she takes deep breath or when she moves in certain ways.  This is most likely musculoskeletal as there was no PET uptake.  We have given her oxycodone without Tylenol. -She had elevated creatinine of 1.58.  I have reviewed her medication list.  The most likely explanation other than Aleve is Abemaciclib which can cause elevated creatinine. -She has grade 2 increased creatinine.  Hence I did not dose reduce Abemaciclib at this time.  We will follow it closely in 1 week.      Orders placed this encounter:  No orders of the defined types  were placed in this encounter.     Derek Jack, MD [place] [phone]

## 2018-07-16 NOTE — Assessment & Plan Note (Signed)
1.  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 -Underwent neoadjuvant chemotherapy with dose dense AC followed by Taxol from 07/14/2015 through 11/29/2015. - Left lumpectomy plus SLNB, consistent with infiltrating lobular carcinoma, pleomorphic features, ER/PR positive, HER-2 negative, 0 out of 3 lymph nodes positive. - Zoladex 3.6 mg IM monthly started in August 2017 followed by prophylactic right breast mastectomy plus completion of left breast simple mastectomy, radiation not indicated. -Zoladex stopped due to joint pain, restarted in October 2017, exemestane started in January 2018 - Exemestane and Zoladex held secondary to joint pains, TAH and BSO in April 2018, exemestane 25 mg daily -Moved to [place] from [place] in March 2018, and was seeing Dr. Zenovia Jordan at [facility]. -Genetic testing with multicolor gene panel was negative. -In August 2018 she was switched to tamoxifen 20 mg daily.  In November 2019 she was switched to anastrozole and she took only for few weeks. -Patient started having epigastric and right upper quadrant pains in mid December 2019. -CT CAP on 06/17/2018 shows very large hepatic mass measuring 22 cm, mildly enlarged left internal mammary node at 0.8 cm, borderline enlarged left lower neck level 4 node at 0.9 cm. - Biopsy of the liver mass on 06/26/2018 showed breast cancer, ER/PR positive and HER-2 negative. -Brain MRI in January 2020 was also negative for metastatic disease. - We had a prolonged discussion about the normal prognosis of estrogen positive metastatic breast cancer with a median survival around 5 years.  Treatment in the palliative setting was discussed. - As patient is currently on Arimidex for a brief period and tamoxifen prior to that, I have recommended fulvestrant with a CDK 4/6 inhibitor.  We talked about  starting her on Abemaciclib 150 mg twice daily on a continuous dosing. -She was started on Faslodex on 07/02/2018.  She has experienced joint pains which have slightly worsened since then.  She is taking Aleve and oxycodone as needed.  The joint pains are mostly in the hips and lower back. -She started Verzenio 150 mg twice daily on 07/07/2018. -She had one episode of diarrhea.  She had to take Imodium for it.  Otherwise she denies any diarrhea. -We will plan to send her tumor for foundation 1 testing to identify other mutations(PIK3CA). - We discussed the results of the PET CT scan dated 07/13/2018 which shows very large centrally necrotic mass involving the right and left hepatic lobes, one other hypermetabolic lesion in the left lobe.  Multiple hypermetabolic lymph nodes in the upper abdomen, left supraclavicular space.  No thoracic adenopathy, chest wall mass, pulmonary or osseous lesions. - She does report some left chest wall pain mostly when she takes deep breath or when she moves in certain ways.  This is most likely musculoskeletal as there was no PET uptake.  We have given her oxycodone without Tylenol. -She had elevated creatinine of 1.58.  I have reviewed her medication list.  The most likely explanation other than Aleve is Abemaciclib which can cause elevated creatinine. -She has grade 2 increased creatinine.  Hence I did not dose reduce Abemaciclib at this time.  We will follow it closely in 1 week.

## 2018-07-17 LAB — VITAMIN D 25 HYDROXY (VIT D DEFICIENCY, FRACTURES): Vit D, 25-Hydroxy: 44.3 ng/mL (ref 30.0–100.0)

## 2018-07-21 ENCOUNTER — Other Ambulatory Visit (HOSPITAL_COMMUNITY): Payer: Self-pay | Admitting: Emergency Medicine

## 2018-07-21 DIAGNOSIS — Z170 Estrogen receptor positive status [ER+]: Principal | ICD-10-CM

## 2018-07-21 DIAGNOSIS — C50112 Malignant neoplasm of central portion of left female breast: Secondary | ICD-10-CM

## 2018-07-22 ENCOUNTER — Inpatient Hospital Stay (HOSPITAL_BASED_OUTPATIENT_CLINIC_OR_DEPARTMENT_OTHER): Payer: 59 | Admitting: Hematology

## 2018-07-22 ENCOUNTER — Inpatient Hospital Stay (HOSPITAL_COMMUNITY): Payer: 59

## 2018-07-22 ENCOUNTER — Encounter (HOSPITAL_COMMUNITY): Payer: Self-pay | Admitting: Hematology

## 2018-07-22 VITALS — BP 122/59 | HR 87 | Temp 98.3°F | Resp 18 | Wt 204.3 lb

## 2018-07-22 DIAGNOSIS — Z170 Estrogen receptor positive status [ER+]: Secondary | ICD-10-CM | POA: Diagnosis not present

## 2018-07-22 DIAGNOSIS — D649 Anemia, unspecified: Secondary | ICD-10-CM

## 2018-07-22 DIAGNOSIS — Z800 Family history of malignant neoplasm of digestive organs: Secondary | ICD-10-CM

## 2018-07-22 DIAGNOSIS — R7989 Other specified abnormal findings of blood chemistry: Secondary | ICD-10-CM

## 2018-07-22 DIAGNOSIS — C50112 Malignant neoplasm of central portion of left female breast: Secondary | ICD-10-CM

## 2018-07-22 DIAGNOSIS — Z79811 Long term (current) use of aromatase inhibitors: Secondary | ICD-10-CM

## 2018-07-22 DIAGNOSIS — C50912 Malignant neoplasm of unspecified site of left female breast: Secondary | ICD-10-CM

## 2018-07-22 DIAGNOSIS — J45909 Unspecified asthma, uncomplicated: Secondary | ICD-10-CM

## 2018-07-22 DIAGNOSIS — M25552 Pain in left hip: Secondary | ICD-10-CM

## 2018-07-22 DIAGNOSIS — C787 Secondary malignant neoplasm of liver and intrahepatic bile duct: Secondary | ICD-10-CM

## 2018-07-22 DIAGNOSIS — R1012 Left upper quadrant pain: Secondary | ICD-10-CM

## 2018-07-22 DIAGNOSIS — R509 Fever, unspecified: Secondary | ICD-10-CM

## 2018-07-22 DIAGNOSIS — C50919 Malignant neoplasm of unspecified site of unspecified female breast: Secondary | ICD-10-CM

## 2018-07-22 DIAGNOSIS — R630 Anorexia: Secondary | ICD-10-CM

## 2018-07-22 DIAGNOSIS — F329 Major depressive disorder, single episode, unspecified: Secondary | ICD-10-CM

## 2018-07-22 DIAGNOSIS — K219 Gastro-esophageal reflux disease without esophagitis: Secondary | ICD-10-CM

## 2018-07-22 DIAGNOSIS — Z79818 Long term (current) use of other agents affecting estrogen receptors and estrogen levels: Secondary | ICD-10-CM

## 2018-07-22 DIAGNOSIS — Z79899 Other long term (current) drug therapy: Secondary | ICD-10-CM

## 2018-07-22 DIAGNOSIS — M25551 Pain in right hip: Secondary | ICD-10-CM

## 2018-07-22 DIAGNOSIS — Z801 Family history of malignant neoplasm of trachea, bronchus and lung: Secondary | ICD-10-CM

## 2018-07-22 DIAGNOSIS — Z803 Family history of malignant neoplasm of breast: Secondary | ICD-10-CM

## 2018-07-22 DIAGNOSIS — R5383 Other fatigue: Secondary | ICD-10-CM

## 2018-07-22 LAB — COMPREHENSIVE METABOLIC PANEL
ALT: 30 U/L (ref 0–44)
AST: 116 U/L — ABNORMAL HIGH (ref 15–41)
Albumin: 4 g/dL (ref 3.5–5.0)
Alkaline Phosphatase: 56 U/L (ref 38–126)
Anion gap: 10 (ref 5–15)
BUN: 23 mg/dL — AB (ref 6–20)
CO2: 21 mmol/L — ABNORMAL LOW (ref 22–32)
Calcium: 9.5 mg/dL (ref 8.9–10.3)
Chloride: 105 mmol/L (ref 98–111)
Creatinine, Ser: 1.4 mg/dL — ABNORMAL HIGH (ref 0.44–1.00)
GFR calc Af Amer: 51 mL/min — ABNORMAL LOW (ref >60)
GFR calc non Af Amer: 44 mL/min — ABNORMAL LOW (ref >60)
Glucose, Bld: 81 mg/dL (ref 70–99)
POTASSIUM: 3.8 mmol/L (ref 3.5–5.1)
Sodium: 136 mmol/L (ref 135–145)
Total Bilirubin: 0.5 mg/dL (ref 0.3–1.2)
Total Protein: 7.8 g/dL (ref 6.5–8.1)

## 2018-07-22 LAB — CBC WITH DIFFERENTIAL/PLATELET
Abs Immature Granulocytes: 0.01 10*3/uL (ref 0.00–0.07)
Basophils Absolute: 0.1 10*3/uL (ref 0.0–0.1)
Basophils Relative: 1 %
EOS ABS: 0.2 10*3/uL (ref 0.0–0.5)
Eosinophils Relative: 3 %
HEMATOCRIT: 34.6 % — AB (ref 36.0–46.0)
Hemoglobin: 11.2 g/dL — ABNORMAL LOW (ref 12.0–15.0)
Immature Granulocytes: 0 %
Lymphocytes Relative: 24 %
Lymphs Abs: 1.5 10*3/uL (ref 0.7–4.0)
MCH: 28.9 pg (ref 26.0–34.0)
MCHC: 32.4 g/dL (ref 30.0–36.0)
MCV: 89.4 fL (ref 80.0–100.0)
Monocytes Absolute: 0.4 10*3/uL (ref 0.1–1.0)
Monocytes Relative: 6 %
Neutro Abs: 3.9 10*3/uL (ref 1.7–7.7)
Neutrophils Relative %: 66 %
PLATELETS: 134 10*3/uL — AB (ref 150–400)
RBC: 3.87 MIL/uL (ref 3.87–5.11)
RDW: 12.9 % (ref 11.5–15.5)
WBC: 6 10*3/uL (ref 4.0–10.5)
nRBC: 0 % (ref 0.0–0.2)

## 2018-07-22 NOTE — Assessment & Plan Note (Addendum)
1.  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 -Underwent neoadjuvant chemotherapy with dose dense AC followed by Taxol from 07/14/2015 through 11/29/2015. - Left lumpectomy plus SLNB, consistent with infiltrating lobular carcinoma, pleomorphic features, ER/PR positive, HER-2 negative, 0 out of 3 lymph nodes positive. - Zoladex 3.6 mg IM monthly started in August 2017 followed by prophylactic right breast mastectomy plus completion of left breast simple mastectomy, radiation not indicated. -Zoladex stopped due to joint pain, restarted in October 2017, exemestane started in January 2018 - Exemestane and Zoladex held secondary to joint pains, TAH and BSO in April 2018, exemestane 25 mg daily -Moved to [place] from [place] in March 2018, and was seeing Dr. Zenovia Jordan at [facility]. -Genetic testing with multicolor gene panel was negative. -In August 2018 she was switched to tamoxifen 20 mg daily.  In November 2019 she was switched to anastrozole and she took only for few weeks. -Patient started having epigastric and right upper quadrant pains in mid December 2019. -CT CAP on 06/17/2018 shows very large hepatic mass measuring 22 cm, mildly enlarged left internal mammary node at 0.8 cm, borderline enlarged left lower neck level 4 node at 0.9 cm. - Biopsy of the liver mass on 06/26/2018 showed breast cancer, ER/PR positive and HER-2 negative. -Brain MRI in January 2020 was also negative for metastatic disease. - We had a prolonged discussion about the normal prognosis of estrogen positive metastatic breast cancer with a median survival around 5 years.  Treatment in the palliative setting was discussed. - As patient is currently on Arimidex for a brief period and tamoxifen prior to that, I have recommended fulvestrant with a CDK 4/6 inhibitor.  We talked about  starting her on Abemaciclib 150 mg twice daily on a continuous dosing. -Faslodex was started on 07/02/2018.  -Verzenio 150 mg twice daily started on 07/07/2018.  -PET/CT scan on 07/13/2018 shows very large centrally necrotic mass involving the right and left hepatic lobes, 1 other hypermetabolic lesion in the left lobe.  Multiple hypermetabolic lymph nodes in the upper abdomen, left supraclavicular space.  No thoracic adenopathy, chest wall mass or pulmonary osseous lesions.  - She reports left chest wall pain has gotten better since last 1 week.  - She had one episode of watery diarrhea on Sunday requiring Imodium.  She mainly complains of fatigue.  She is not requiring any oxycodone.  She requires Aleve occasionally.  She does have epigastric pain from liver mets. -We will send her tumor for foundation 1 testing. -We have reviewed her labs today.  Creatinine improved to 1.4.  AST has gotten slightly worse.  No dose modification at this time. -She will come back next week for Faslodex injection.  We will check her labs at that time.  We will see her back in 3 weeks for follow-up.  2.  Cytopenias: -She has all of mild thrombocytopenia and anemia secondary to abemaciclib.  We will closely watch it.

## 2018-07-22 NOTE — Progress Notes (Signed)
Pamela Long, [place] [address]   CLINIC:  Medical Oncology/Hematology  PCP:  Doree Albee, MD [place] [address] [phone]   REASON FOR VISIT: Follow-up for Metastatic breast cancer to the liver   CURRENT THERAPY: abemaciclib    INTERVAL HISTORY:  Pamela Long 48 y.o. female returns for routine follow-up for metastatic breast cancer to the liver. She reports she had watery diarrhea several days last week. She took imodium a couple times while she was working. She had abdominal cramping at times. Her fatigue is worse since starting the medication. She is trying to remain active. She also still has right upper abdominal pain but it is improved since starting the medication. She has also had a few hot flashes since starting. Denies any nausea, vomiting, or diarrhea. Denies any new pains. Had not noticed any recent bleeding such as epistaxis, hematuria or hematochezia. Denies recent chest pain on exertion, shortness of breath on minimal exertion, pre-syncopal episodes, or palpitations. Denies any numbness or tingling in hands or feet. Denies any recent fevers, infections, or recent hospitalizations. Patient reports appetite at 50% and energy level at 0%.    REVIEW OF SYSTEMS:  Review of Systems  Constitutional: Positive for fatigue.     PAST MEDICAL/SURGICAL HISTORY:  Past Medical History:  Diagnosis Date  . Anemia   . Anxiety   . Asthma   . Breast cancer ([place])    Left, 2017  . Depression   . GERD (gastroesophageal reflux disease)   . Migraine   . OAB (overactive bladder)   . Seasonal allergies    Past Surgical History:  Procedure Laterality Date  . ABDOMINAL HYSTERECTOMY     breast cancer  . ANKLE RECONSTRUCTION Right 1989  . BREAST SURGERY    . FOREIGN BODY REMOVAL N/A 08/29/2017   from lip  . KNEE SURGERY Right 1989   bone spur  . MASTECTOMY  2017   bil mastectomies  . SINUSOTOMY       SOCIAL  HISTORY:  Social History   Socioeconomic History  . Marital status: Single    Spouse name: Not on file  . Number of children: 1  . Years of education: 62  . Highest education level: Not on file  Occupational History  . Occupation: disabled  Social Needs  . Financial resource strain: Somewhat hard  . Food insecurity:    Worry: Sometimes true    Inability: Sometimes true  . Transportation needs:    Medical: No    Non-medical: No  Tobacco Use  . Smoking status: Never Smoker  . Smokeless tobacco: Never Used  Substance and Sexual Activity  . Alcohol use: No  . Drug use: No  . Sexual activity: Not Currently  Lifestyle  . Physical activity:    Days per week: 0 days    Minutes per session: 0 min  . Stress: Rather much  Relationships  . Social connections:    Talks on phone: Once a week    Gets together: Once a week    Attends religious service: 1 to 4 times per year    Active member of club or organization: Yes    Attends meetings of clubs or organizations: Never    Relationship status: Never married  . Intimate partner violence:    Fear of current or ex partner: No    Emotionally abused: No    Physically abused: No    Forced sexual activity: No  Other Topics Concern  . Not on file  Social History Narrative   Bachelors degree   Accounting   Lives with daughter Pamela Long who has autism and diabetes   Likes to sew, quilt, crafts, crochet    FAMILY HISTORY:  Family History  Problem Relation Age of Onset  . Arthritis Mother   . COPD Mother   . Depression Mother   . Diabetes Mother   . Kidney disease Father   . Heart disease Father 60  . Drug abuse Father   . Hypertension Father   . Diabetes Daughter   . Hashimoto's thyroiditis Daughter   . Irritable bowel syndrome Daughter   . Autism spectrum disorder Daughter   . Early death Maternal Grandmother        drowned  . Early death Maternal Grandfather   . Heart disease Maternal Grandfather   . Heart disease  Paternal Grandfather   . Breast cancer Maternal Aunt   . Breast cancer Cousin   . Lung cancer Maternal Aunt   . Lung cancer Maternal Uncle   . Colon cancer Neg Hx     CURRENT MEDICATIONS:  Outpatient Encounter Medications as of 07/22/2018  Medication Sig Note  . abemaciclib (VERZENIO) 150 MG tablet Take 1 tablet (150 mg total) by mouth 2 (two) times daily. Swallow tablets whole. Do not chew, crush, or split tablets before swallowing.   . Cholecalciferol (VITAMIN D3 PO) Take 5 each by mouth daily.  07/21/2018: Chewable  . donepezil (ARICEPT) 5 MG tablet Take 5 mg by mouth at bedtime.   Marland Kitchen escitalopram (LEXAPRO) 20 MG tablet Take 1 tablet (20 mg total) by mouth daily.   . fulvestrant (FASLODEX) 250 MG/5ML injection Inject 250 mg into the muscle every 14 (fourteen) days. One injection each buttock over 1-2 minutes. Warm prior to use.   . gabapentin (NEURONTIN) 300 MG capsule Take 300 mg by mouth at bedtime.    Marland Kitchen loperamide (IMODIUM) 2 MG capsule Take 2-4 mg by mouth daily as needed for diarrhea or loose stools.   . naproxen sodium (ALEVE) 220 MG tablet Take 220-440 mg by mouth 2 (two) times daily as needed (for pain or headache).    . ondansetron (ZOFRAN) 4 MG tablet Take 1 tablet (4 mg total) by mouth every 8 (eight) hours as needed for nausea or vomiting.   Marland Kitchen oxyCODONE (OXY IR/ROXICODONE) 5 MG immediate release tablet Take 1 tablet (5 mg total) by mouth every 8 (eight) hours as needed for severe pain.   Marland Kitchen oxyCODONE-acetaminophen (PERCOCET/ROXICET) 5-325 MG tablet Take 1-2 tablets by mouth every 6 (six) hours as needed for severe pain. (Patient not taking: Reported on 07/21/2018)   . pantoprazole (PROTONIX) 40 MG tablet TAKE 1 TABLET BY MOUTH DAILY BEFORE BREAKFAST (Patient taking differently: Take 40 mg by mouth daily before breakfast. )   . Polyvinyl Alcohol-Povidone PF (REFRESH) 1.4-0.6 % SOLN Place 1 drop into both eyes daily as needed (for dry eyes).   . rizatriptan (MAXALT-MLT) 10 MG  disintegrating tablet Take 10 mg by mouth every 2 (two) hours as needed for migraine.    Marland Kitchen TROKENDI XR 50 MG CP24 Take 50 mg by mouth 2 (two) times daily.    No facility-administered encounter medications on file as of 07/22/2018.     ALLERGIES:  Allergies  Allergen Reactions  . Corn-Containing Products Other (See Comments)    Headache and GI upset  . Tape Itching and Other (See Comments)    Depending on the adhesive-blistering occurs  .  Amoxicillin Hives and Other (See Comments)    Rash only DID THE REACTION INVOLVE: Swelling of the face/tongue/throat, SOB, or low BP? Sudden or severe rash/hives, skin peeling, or the inside of the mouth or nose?  Did it require medical treatment?  When did it last happen? If all above answers are "NO", may proceed with cephalosporin use.   . Caffeine Diarrhea, Nausea Only, Palpitations and Other (See Comments)    Headache  . Tetanus Toxoids Swelling and Other (See Comments)    Local reaction     PHYSICAL EXAM:  ECOG Performance status: 1  Vitals:   07/22/18 1420  BP: (!) 122/59  Pulse: 87  Resp: 18  Temp: 98.3 F (36.8 C)  SpO2: 100%   Filed Weights   07/22/18 1420  Weight: 204 lb 4.8 oz (92.7 kg)    Physical Exam Constitutional:      Appearance: Normal appearance. She is normal weight.  Cardiovascular:     Rate and Rhythm: Normal rate and regular rhythm.     Heart sounds: Normal heart sounds.  Pulmonary:     Effort: Pulmonary effort is normal.     Breath sounds: Normal breath sounds.  Abdominal:     General: Abdomen is flat.     Palpations: Abdomen is soft.  Musculoskeletal: Normal range of motion.  Skin:    General: Skin is warm and dry.  Neurological:     Mental Status: She is alert and oriented to person, place, and time. Mental status is at baseline.  Psychiatric:        Mood and Affect: Mood normal.        Behavior: Behavior normal.        Thought Content: Thought content normal.        Judgment: Judgment  normal.      LABORATORY DATA:  I have reviewed the labs as listed.  CBC    Component Value Date/Time   WBC 6.0 07/22/2018 1320   RBC 3.87 07/22/2018 1320   HGB 11.2 (L) 07/22/2018 1320   HCT 34.6 (L) 07/22/2018 1320   PLT 134 (L) 07/22/2018 1320   MCV 89.4 07/22/2018 1320   MCH 28.9 07/22/2018 1320   MCHC 32.4 07/22/2018 1320   RDW 12.9 07/22/2018 1320   LYMPHSABS 1.5 07/22/2018 1320   MONOABS 0.4 07/22/2018 1320   EOSABS 0.2 07/22/2018 1320   BASOSABS 0.1 07/22/2018 1320   CMP Latest Ref Rng & Units 07/22/2018 07/16/2018 06/17/2018  Glucose 70 - 99 mg/dL 81 104(H) 85  BUN 6 - 20 mg/dL 23(H) 27(H) 18  Creatinine 0.44 - 1.00 mg/dL 1.40(H) 1.56(H) 0.94  Sodium 135 - 145 mmol/L 136 139 137  Potassium 3.5 - 5.1 mmol/L 3.8 4.3 3.4(L)  Chloride 98 - 111 mmol/L 105 107 104  CO2 22 - 32 mmol/L 21(L) 23 24  Calcium 8.9 - 10.3 mg/dL 9.5 9.6 8.7(L)  Total Protein 6.5 - 8.1 g/dL 7.8 8.2(H) 7.3  Total Bilirubin 0.3 - 1.2 mg/dL 0.5 0.3 0.7  Alkaline Phos 38 - 126 U/L 56 63 55  AST 15 - 41 U/L 116(H) 101(H) 91(H)  ALT 0 - 44 U/L 30 32 57(H)       DIAGNOSTIC IMAGING:  I have independently reviewed the scans and discussed with the patient.   I have reviewed Francene Finders, NP's note and agree with the documentation.  I personally performed a face-to-face visit, made revisions and my assessment and plan is as follows.    ASSESSMENT & PLAN:  Metastatic breast cancer ([place]) 1.  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 -Underwent neoadjuvant chemotherapy with dose dense AC followed by Taxol from 07/14/2015 through 11/29/2015. - Left lumpectomy plus SLNB, consistent with infiltrating lobular carcinoma, pleomorphic features, ER/PR positive, HER-2 negative, 0 out of 3 lymph nodes positive. - Zoladex 3.6 mg IM monthly started in August 2017 followed by  prophylactic right breast mastectomy plus completion of left breast simple mastectomy, radiation not indicated. -Zoladex stopped due to joint pain, restarted in October 2017, exemestane started in January 2018 - Exemestane and Zoladex held secondary to joint pains, TAH and BSO in April 2018, exemestane 25 mg daily -Moved to [place] from [place] in March 2018, and was seeing Dr. Zenovia Jordan at [facility]. -Genetic testing with multicolor gene panel was negative. -In August 2018 she was switched to tamoxifen 20 mg daily.  In November 2019 she was switched to anastrozole and she took only for few weeks. -Patient started having epigastric and right upper quadrant pains in mid December 2019. -CT CAP on 06/17/2018 shows very large hepatic mass measuring 22 cm, mildly enlarged left internal mammary node at 0.8 cm, borderline enlarged left lower neck level 4 node at 0.9 cm. - Biopsy of the liver mass on 06/26/2018 showed breast cancer, ER/PR positive and HER-2 negative. -Brain MRI in January 2020 was also negative for metastatic disease. - We had a prolonged discussion about the normal prognosis of estrogen positive metastatic breast cancer with a median survival around 5 years.  Treatment in the palliative setting was discussed. - As patient is currently on Arimidex for a brief period and tamoxifen prior to that, I have recommended fulvestrant with a CDK 4/6 inhibitor.  We talked about starting her on Abemaciclib 150 mg twice daily on a continuous dosing. -Faslodex was started on 07/02/2018.  -Verzenio 150 mg twice daily started on 07/07/2018.  -PET/CT scan on 07/13/2018 shows very large centrally necrotic mass involving the right and left hepatic lobes, 1 other hypermetabolic lesion in the left lobe.  Multiple hypermetabolic lymph nodes in the upper abdomen, left supraclavicular space.  No thoracic adenopathy, chest wall mass or pulmonary osseous lesions.  - She reports left chest wall pain has gotten  better since last 1 week.  - She had one episode of watery diarrhea on Sunday requiring Imodium.  She mainly complains of fatigue.  She is not requiring any oxycodone.  She requires Aleve occasionally.  She does have epigastric pain from liver mets. -We will send her tumor for foundation 1 testing. -We have reviewed her labs today.  Creatinine improved to 1.4.  AST has gotten slightly worse.  No dose modification at this time. -She will come back next week for Faslodex injection.  We will check her labs at that time.  We will see her back in 3 weeks for follow-up.  2.  Cytopenias: -She has all of mild thrombocytopenia and anemia secondary to abemaciclib.  We will closely watch it.      Orders placed this encounter:  Orders Placed This Encounter  Procedures  . Comprehensive metabolic panel  . Magnesium  . CBC with Differential/Platelet  . Comprehensive metabolic panel      Derek Jack, MD [place] [phone]

## 2018-07-22 NOTE — Patient Instructions (Signed)
[place] at [facility] Discharge Instructions  You were seen by Dr. [name] today   Thank you for choosing [place] at [facility] to provide your oncology and hematology care.  To afford each patient quality time with our provider, please arrive at least 15 minutes before your scheduled appointment time.   If you have a lab appointment with the [facility] please come in thru the  Main Entrance and check in at the main information desk  You need to re-schedule your appointment should you arrive 10 or more minutes late.  We strive to give you quality time with our providers, and arriving late affects you and other patients whose appointments are after yours.  Also, if you no show three or more times for appointments you may be dismissed from the clinic at the providers discretion.     Again, thank you for choosing [facility].  Our hope is that these requests will decrease the amount of time that you wait before being seen by our physicians.       _____________________________________________________________  Should you have questions after your visit to [facility], please contact our office at [phone] between the hours of 8:00 a.m. and 4:30 p.m.  Voicemails left after 4:00 p.m. will not be returned until the following business day.  For prescription refill requests, have your pharmacy contact our office and allow 72 hours.    Cancer Center Support Programs:   > Cancer Support Group  2nd Tuesday of the month 1pm-2pm, Journey Room

## 2018-07-23 ENCOUNTER — Encounter (HOSPITAL_COMMUNITY): Payer: Self-pay | Admitting: *Deleted

## 2018-07-23 NOTE — Progress Notes (Signed)
I spoke with Lesly Rubenstein in pathology at [facility] [phone]) and their pathologist Dr. Tamala Julian has sent the specimen out to [facility] for tumor board review and they are expecting it to be returned soon.  I will send request for foundation medicine to be sent out as soon as they get specimen block back.  Their accession # is [phone].

## 2018-07-24 ENCOUNTER — Encounter (HOSPITAL_COMMUNITY): Payer: Self-pay | Admitting: *Deleted

## 2018-07-24 NOTE — Progress Notes (Signed)
Patient called today stating that the area on her right buttock where she received her Faslodex injection last week is itching. The skin around the area is red and she describes a 1in x 3in oblong area of firmness under the skin.  I asked her to compare it to the injection site on the left buttock as well and she states that they are both the same firmness the right side is slightly bigger and it just itches.  She denies pain in either site.   I told the patient to apply hydrocortisone cream for the itching and use cool/cold compresses for the itching as well.  I told her to mark the red area and to avoid scratching if she can to prevent skin breakdown.  I told her to call back on Monday to let me know if the red area is bigger and how the itching was controlled over the weekend.  At that time, I will triage if she needs to come in to the clinic for nurse visit.

## 2018-07-27 ENCOUNTER — Encounter (HOSPITAL_COMMUNITY): Payer: Self-pay | Admitting: *Deleted

## 2018-07-27 NOTE — Progress Notes (Signed)
Patient called clinic today just to update [place] on her buttock.  She states that the area is pink now, not as red, and it doesn't is as much as it was.  She feels like it is getting better.  I advised patient to continue placing cream on it for the itching and to let us know should it get worse. She verbalizes understanding.

## 2018-07-28 ENCOUNTER — Other Ambulatory Visit (HOSPITAL_COMMUNITY): Payer: Self-pay | Admitting: Hematology

## 2018-07-28 DIAGNOSIS — C50919 Malignant neoplasm of unspecified site of unspecified female breast: Secondary | ICD-10-CM

## 2018-07-28 NOTE — Progress Notes (Signed)
Discussed plan to proceed with colonoscopy with both Dr. [name] and Dr. Gala Romney. Both in agreement.

## 2018-07-29 ENCOUNTER — Ambulatory Visit (HOSPITAL_COMMUNITY): Admission: RE | Admit: 2018-07-29 | Payer: 59 | Source: Home / Self Care | Admitting: Internal Medicine

## 2018-07-29 ENCOUNTER — Encounter (HOSPITAL_COMMUNITY): Admission: RE | Payer: Self-pay | Source: Home / Self Care

## 2018-07-29 ENCOUNTER — Inpatient Hospital Stay (HOSPITAL_COMMUNITY): Payer: 59

## 2018-07-29 DIAGNOSIS — Z170 Estrogen receptor positive status [ER+]: Principal | ICD-10-CM

## 2018-07-29 DIAGNOSIS — C50112 Malignant neoplasm of central portion of left female breast: Secondary | ICD-10-CM

## 2018-07-29 DIAGNOSIS — C50912 Malignant neoplasm of unspecified site of left female breast: Secondary | ICD-10-CM | POA: Diagnosis not present

## 2018-07-29 LAB — COMPREHENSIVE METABOLIC PANEL
ALK PHOS: 53 U/L (ref 38–126)
ALT: 32 U/L (ref 0–44)
ANION GAP: 10 (ref 5–15)
AST: 134 U/L — ABNORMAL HIGH (ref 15–41)
Albumin: 3.8 g/dL (ref 3.5–5.0)
BUN: 18 mg/dL (ref 6–20)
CO2: 25 mmol/L (ref 22–32)
Calcium: 9.5 mg/dL (ref 8.9–10.3)
Chloride: 106 mmol/L (ref 98–111)
Creatinine, Ser: 1.4 mg/dL — ABNORMAL HIGH (ref 0.44–1.00)
GFR calc Af Amer: 51 mL/min — ABNORMAL LOW (ref >60)
GFR calc non Af Amer: 44 mL/min — ABNORMAL LOW (ref >60)
Glucose, Bld: 95 mg/dL (ref 70–99)
Potassium: 3.8 mmol/L (ref 3.5–5.1)
Sodium: 141 mmol/L (ref 135–145)
Total Bilirubin: 0.5 mg/dL (ref 0.3–1.2)
Total Protein: 8 g/dL (ref 6.5–8.1)

## 2018-07-29 SURGERY — COLONOSCOPY
Anesthesia: Moderate Sedation

## 2018-07-30 ENCOUNTER — Encounter (HOSPITAL_COMMUNITY): Payer: Self-pay

## 2018-07-30 ENCOUNTER — Inpatient Hospital Stay (HOSPITAL_COMMUNITY): Payer: 59

## 2018-07-30 ENCOUNTER — Other Ambulatory Visit (HOSPITAL_COMMUNITY): Payer: Self-pay | Admitting: *Deleted

## 2018-07-30 ENCOUNTER — Ambulatory Visit (HOSPITAL_COMMUNITY): Payer: 59

## 2018-07-30 ENCOUNTER — Ambulatory Visit (HOSPITAL_COMMUNITY): Payer: 59 | Admitting: Hematology

## 2018-07-30 ENCOUNTER — Other Ambulatory Visit (HOSPITAL_COMMUNITY): Payer: 59

## 2018-07-30 VITALS — BP 121/71 | HR 84 | Temp 98.9°F | Resp 18 | Wt 201.8 lb

## 2018-07-30 DIAGNOSIS — C50112 Malignant neoplasm of central portion of left female breast: Secondary | ICD-10-CM

## 2018-07-30 DIAGNOSIS — C50912 Malignant neoplasm of unspecified site of left female breast: Secondary | ICD-10-CM | POA: Diagnosis not present

## 2018-07-30 DIAGNOSIS — Z01818 Encounter for other preprocedural examination: Secondary | ICD-10-CM | POA: Diagnosis not present

## 2018-07-30 DIAGNOSIS — C50919 Malignant neoplasm of unspecified site of unspecified female breast: Secondary | ICD-10-CM

## 2018-07-30 DIAGNOSIS — Z170 Estrogen receptor positive status [ER+]: Principal | ICD-10-CM

## 2018-07-30 MED ORDER — FULVESTRANT 250 MG/5ML IM SOLN
500.0000 mg | Freq: Once | INTRAMUSCULAR | Status: AC
  Administered 2018-07-30: 500 mg via INTRAMUSCULAR

## 2018-07-30 MED ORDER — ABEMACICLIB 150 MG PO TABS: 150.0000 mg | ORAL_TABLET | Freq: Two times a day (BID) | ORAL | 0 refills | Status: DC

## 2018-07-30 MED FILL — VERZENIO 150 MG TAB: 150 | 28 days supply | Qty: 56 | Fill #0

## 2018-07-30 NOTE — Progress Notes (Signed)
Pamela Long tolerated Faslodex injections well without complaints or incident. VSS Pt discharged self ambulatory in satisfactory condition

## 2018-07-30 NOTE — Patient Instructions (Signed)
[facility] at [facility] Discharge Instructions  Received Faslodex injections today. Follow-up as scheduled. Call clinic for any questions or concerns   Thank you for choosing  [facility] at [facility] to provide your oncology and hematology care.  To afford each patient quality time with our provider, please arrive at least 15 minutes before your scheduled appointment time.   If you have a lab appointment with the Cancer Center please come in thru the  Main Entrance and check in at the main information desk  You need to re-schedule your appointment should you arrive 10 or more minutes late.  We strive to give you quality time with our providers, and arriving late affects you and other patients whose appointments are after yours.  Also, if you no show three or more times for appointments you may be dismissed from the clinic at the providers discretion.     Again, thank you for choosing [facility].  Our hope is that these requests will decrease the amount of time that you wait before being seen by our physicians.       _____________________________________________________________  Should you have questions after your visit to [facility], please contact our office at [phone] between the hours of 8:00 a.m. and 4:30 p.m.  Voicemails left after 4:00 p.m. will not be returned until the following business day.  For prescription refill requests, have your pharmacy contact our office and allow 72 hours.    Cancer Center Support Programs:   > Cancer Support Group  2nd Tuesday of the month 1pm-2pm, Journey Room   

## 2018-07-30 NOTE — Telephone Encounter (Signed)
Chart reviewed, verzenio refilled.

## 2018-07-31 ENCOUNTER — Encounter (HOSPITAL_COMMUNITY)
Admission: RE | Admit: 2018-07-31 | Discharge: 2018-07-31 | Disposition: A | Discharge status: Home / Self Care | Payer: 59 | Source: Ambulatory Visit | Attending: Internal Medicine | Admitting: Internal Medicine

## 2018-07-31 DIAGNOSIS — Z01818 Encounter for other preprocedural examination: Secondary | ICD-10-CM | POA: Insufficient documentation

## 2018-08-03 ENCOUNTER — Other Ambulatory Visit (HOSPITAL_COMMUNITY): Payer: Self-pay | Admitting: *Deleted

## 2018-08-03 ENCOUNTER — Encounter (HOSPITAL_COMMUNITY): Payer: Self-pay | Admitting: *Deleted

## 2018-08-03 ENCOUNTER — Ambulatory Visit (HOSPITAL_COMMUNITY)
Admission: RE | Admit: 2018-08-03 | Discharge: 2018-08-03 | Disposition: A | Discharge status: Home / Self Care | Payer: 59 | Source: Ambulatory Visit | Attending: Internal Medicine | Admitting: Internal Medicine

## 2018-08-03 DIAGNOSIS — Z170 Estrogen receptor positive status [ER+]: Secondary | ICD-10-CM | POA: Insufficient documentation

## 2018-08-03 DIAGNOSIS — C50112 Malignant neoplasm of central portion of left female breast: Secondary | ICD-10-CM

## 2018-08-03 DIAGNOSIS — R079 Chest pain, unspecified: Secondary | ICD-10-CM | POA: Diagnosis present

## 2018-08-03 IMAGING — CT CT ANGIO CHEST
2 of 6 series · 17 of 46 positions shown · IV contrast (Isovue)
Comparison: PET-CT [DATE]

CLINICAL DATA: Chest pain.  History of breast carcinoma

EXAM:
CT ANGIOGRAPHY CHEST WITH CONTRAST
TECHNIQUE: Multidetector CT imaging of the chest was performed using the
standard protocol during bolus administration of intravenous
contrast. Multiplanar CT image reconstructions and MIPs were
obtained to evaluate the vascular anatomy.
CONTRAST:  150mL [F0] IOPAMIDOL ([F0]) INJECTION 76%

[Series 5: thins · axial · 0.65mm/px · z∈[+1311,+1532]mm · 14 of 243 slices shown]
[im 11/243  lung]
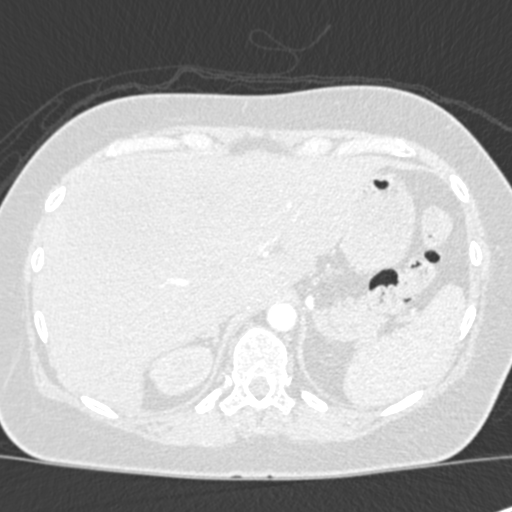
[im 32/243  soft-tissue]
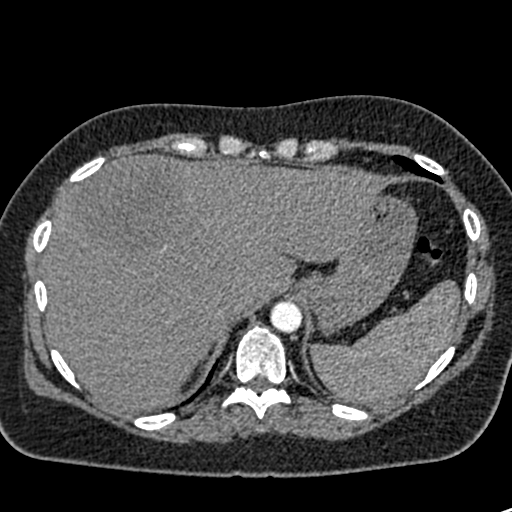
[im 43/243  lung]
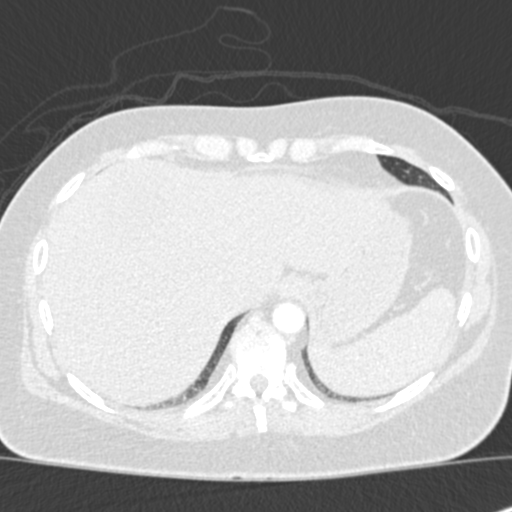
[im 64/243  soft-tissue]
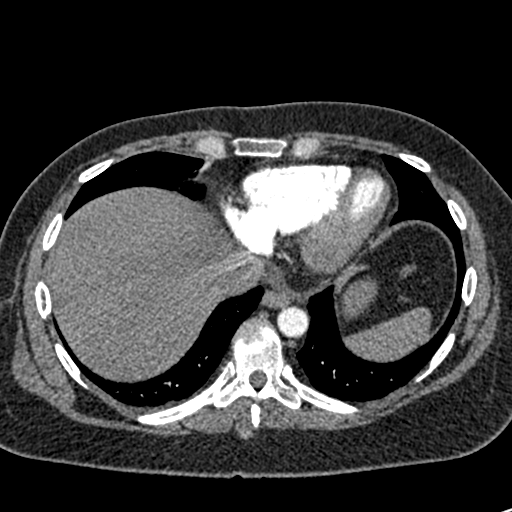
[im 85/243  lung]
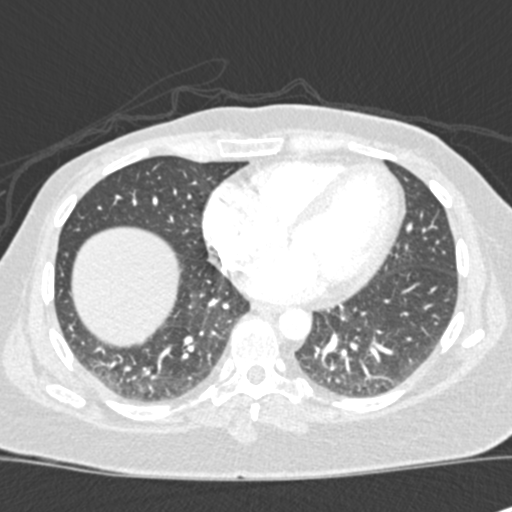
[im 95/243  soft-tissue]
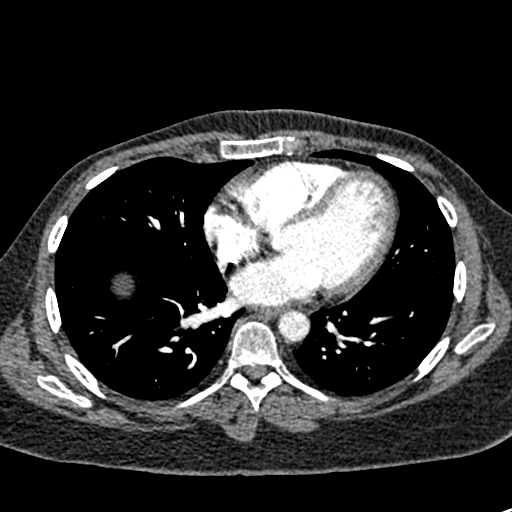
[im 116/243  lung]
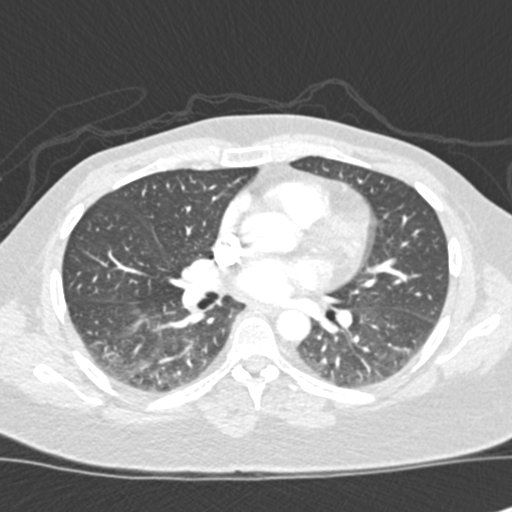
[im 127/243  soft-tissue]
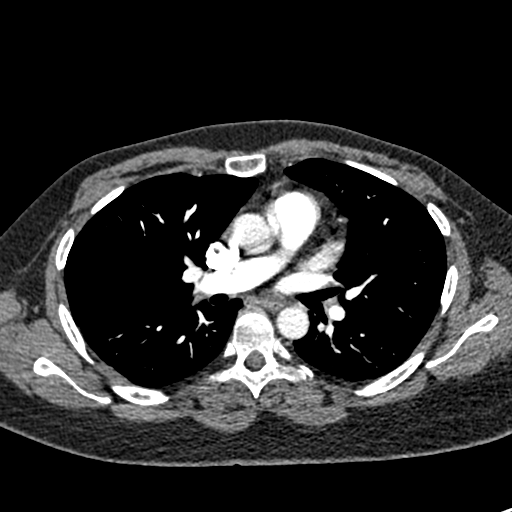
[im 148/243  lung]
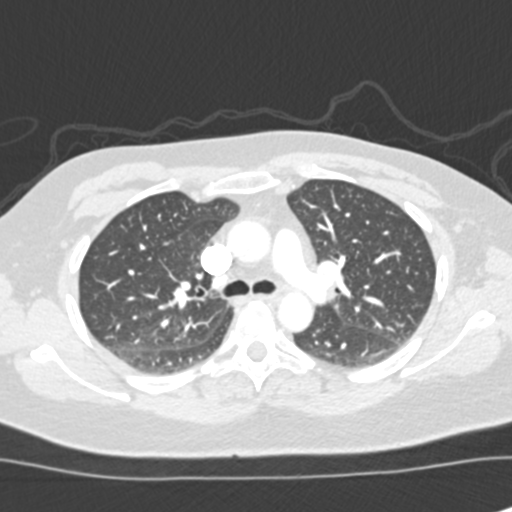
[im 158/243  soft-tissue]
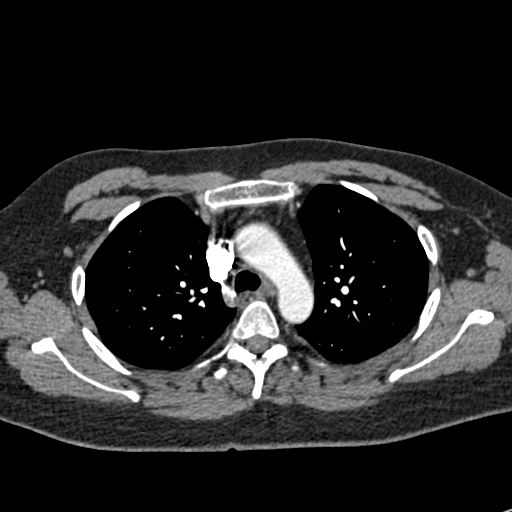
[im 179/243  lung]
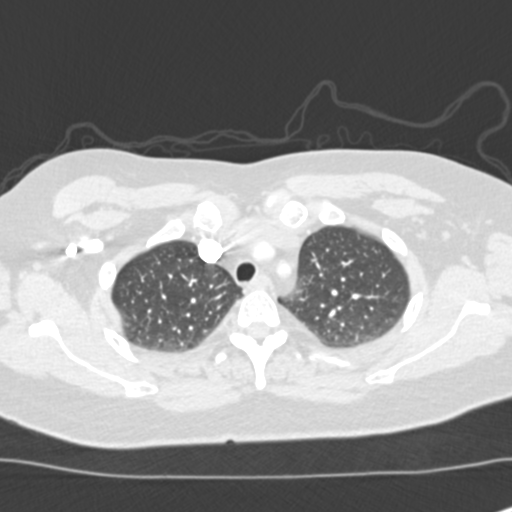
[im 200/243  soft-tissue]
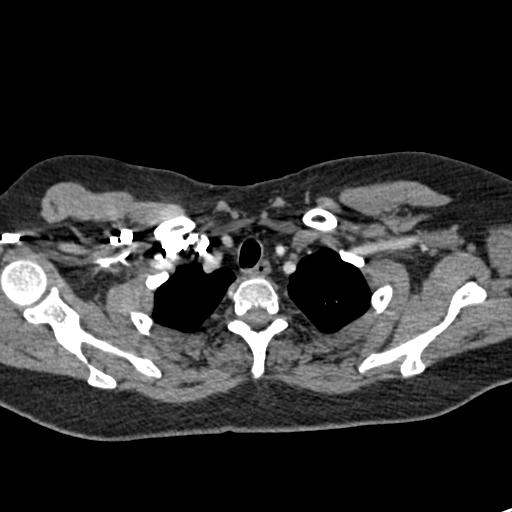
[im 211/243  lung]
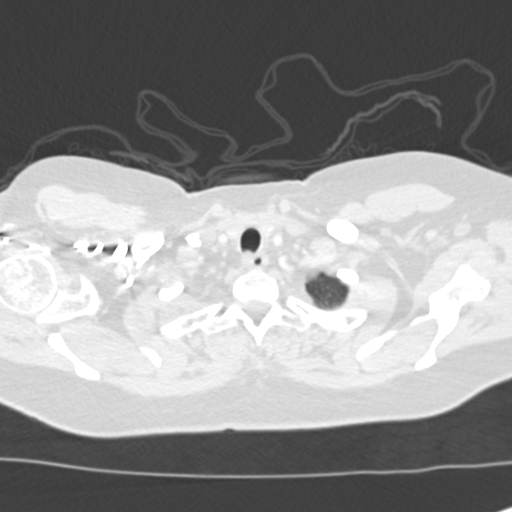
[im 232/243  soft-tissue]
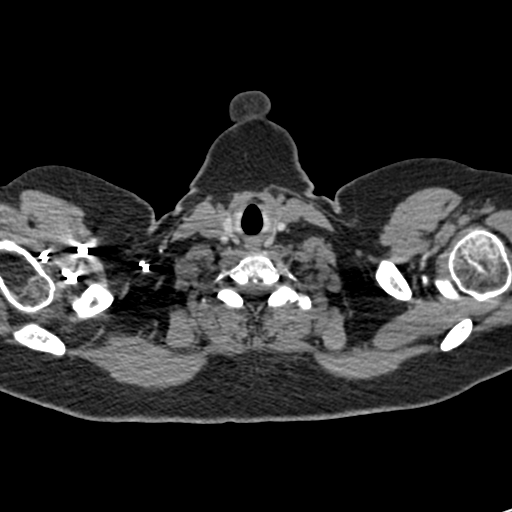

[Series 7: coronal mpr · coronal · 0.49mm/px · 3 of 113 slices shown]
[im 29/113  soft-tissue]
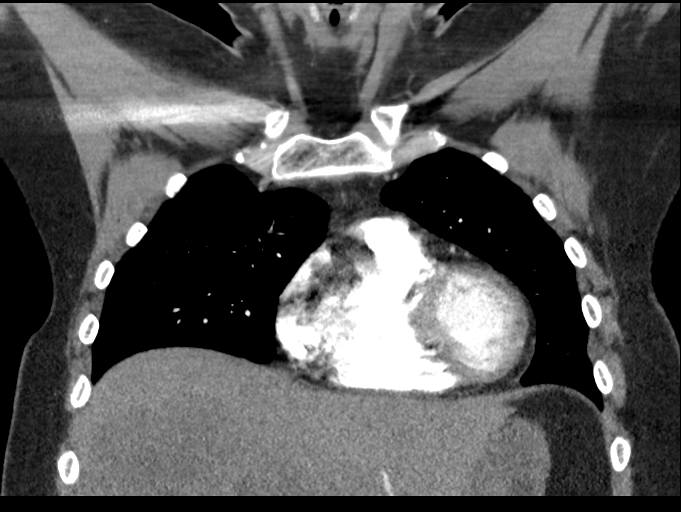
[im 57/113  soft-tissue]
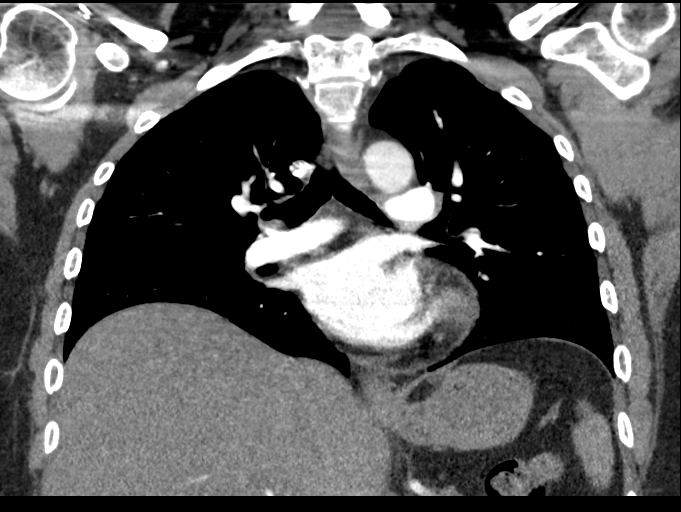
[im 85/113  soft-tissue]
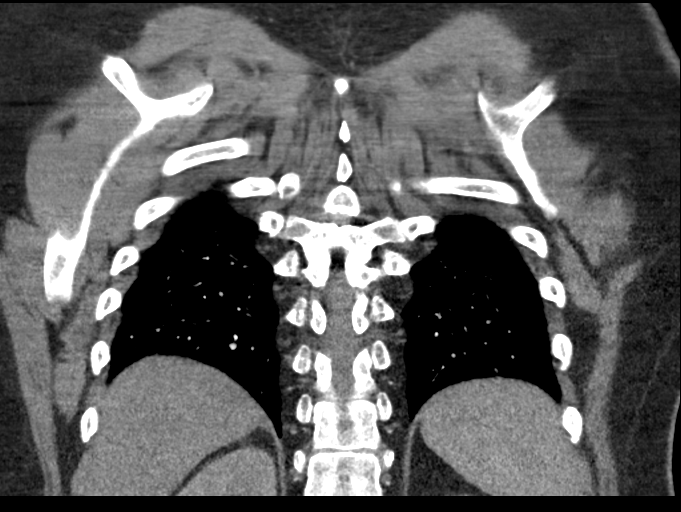

[17 of 46 positions shown; findings below may reference images not displayed]

FINDINGS: Cardiovascular: There is no demonstrable pulmonary embolus. There is
no thoracic aortic aneurysm or dissection. The visualized great
vessels appear unremarkable. Note that the right innominate and left
common carotid arteries arise as a common trunk, an anatomic
variant. There is no pericardial effusion or pericardial thickening.

Mediastinum/Nodes: Thyroid appears normal. There are several small
left supraclavicular lymph nodes which showed abnormal radiotracer
uptake on the recent PET study. The largest of these lymph nodes has
a short axis diameter of 7 mm. By size criteria, there is no frank
adenopathy evident. No esophageal lesions are appreciable.

Lungs/Pleura: There is no parenchymal lung edema or consolidation.
There is slight bibasilar atelectasis. There is no pleural effusion
or pleural thickening evident.

Upper Abdomen: There is incomplete visualization of a large mass
arising in the liver which displaces and distorts vascular
architecture. Visualized upper abdominal structures otherwise appear
within normal limits.

Musculoskeletal: No blastic or lytic bone lesions are evident. No
chest wall lesions are appreciable. Patient has had bilateral
mastectomies.

Review of the MIP images confirms the above findings.
IMPRESSION: 1. No demonstrable pulmonary embolus. No thoracic aortic aneurysm or
dissection.

2.  No edema or consolidation.  Mild bibasilar atelectasis.

3. Stable subcentimeter left supraclavicular lymph nodes which did
show abnormal uptake of radiotracer on the recent PET study. No
adenopathy strictly by size criteria evident.

4.  Large mass arising from the liver, noted on recent PET study.

5.  Status post bilateral mastectomies.

## 2018-08-03 MED ORDER — IOPAMIDOL (ISOVUE-370) INJECTION 76%
150.0000 mL | Freq: Once | INTRAVENOUS | Status: AC | PRN
  Administered 2018-08-03: 150 mL via INTRAVENOUS

## 2018-08-03 NOTE — Progress Notes (Signed)
Patient called clinic stating that Saturday night she had sudden onset of chest pain that she describes as being on the right side mid-chest.  She states it is stabbing in nature when she takes a deep breath, when she sneezes, any sudden movement is a sharp stabbing pain.  Patient states that she has taken some oxycodone but it has only dulled the pain some but it is still sharp in its intensity.  She denies any n/v/SOB just pain with deep breathing.    I have spoke with Dr. [name] and she has ordered STAT CT Angio chest for patient.  We will get patient to come in for testing.

## 2018-08-05 ENCOUNTER — Telehealth: Payer: Self-pay | Admitting: *Deleted

## 2018-08-05 ENCOUNTER — Telehealth (INDEPENDENT_AMBULATORY_CARE_PROVIDER_SITE_OTHER): Payer: Self-pay | Admitting: Internal Medicine

## 2018-08-05 NOTE — Telephone Encounter (Signed)
Patient called stating that she vomited the prep.  She may have kept some down. Has taken bisacodyl 2 tablets. Patient advised to take 2 more tablets at 8 PM and drink plenty of liquids. She will call back later tonight if stool is not clear or near clear.

## 2018-08-05 NOTE — Telephone Encounter (Signed)
Called spoke with pt. She was agreeable to having [facility] perform procedure tomorrow 08/06/2018. Patient also has moved up on schedule and is aware of new prep time for tomorrow

## 2018-08-06 ENCOUNTER — Ambulatory Visit (HOSPITAL_COMMUNITY): Payer: 59 | Admitting: Anesthesiology

## 2018-08-06 ENCOUNTER — Ambulatory Visit (HOSPITAL_COMMUNITY)
Admission: RE | Admit: 2018-08-06 | Discharge: 2018-08-06 | Disposition: A | Discharge status: Home / Self Care | Payer: 59 | Attending: Gastroenterology | Admitting: Gastroenterology

## 2018-08-06 ENCOUNTER — Encounter (HOSPITAL_COMMUNITY)
Admission: RE | Disposition: A | Discharge status: Home / Self Care | Payer: Self-pay | Source: Home / Self Care | Attending: Gastroenterology

## 2018-08-06 ENCOUNTER — Encounter (HOSPITAL_COMMUNITY): Payer: Self-pay | Admitting: *Deleted

## 2018-08-06 DIAGNOSIS — Q438 Other specified congenital malformations of intestine: Secondary | ICD-10-CM

## 2018-08-06 DIAGNOSIS — Z79899 Other long term (current) drug therapy: Secondary | ICD-10-CM | POA: Insufficient documentation

## 2018-08-06 DIAGNOSIS — N3281 Overactive bladder: Secondary | ICD-10-CM | POA: Insufficient documentation

## 2018-08-06 DIAGNOSIS — K219 Gastro-esophageal reflux disease without esophagitis: Secondary | ICD-10-CM | POA: Insufficient documentation

## 2018-08-06 DIAGNOSIS — K649 Unspecified hemorrhoids: Secondary | ICD-10-CM

## 2018-08-06 DIAGNOSIS — Z853 Personal history of malignant neoplasm of breast: Secondary | ICD-10-CM | POA: Insufficient documentation

## 2018-08-06 DIAGNOSIS — J45909 Unspecified asthma, uncomplicated: Secondary | ICD-10-CM | POA: Diagnosis not present

## 2018-08-06 DIAGNOSIS — K602 Anal fissure, unspecified: Secondary | ICD-10-CM | POA: Insufficient documentation

## 2018-08-06 DIAGNOSIS — K625 Hemorrhage of anus and rectum: Secondary | ICD-10-CM

## 2018-08-06 DIAGNOSIS — F419 Anxiety disorder, unspecified: Secondary | ICD-10-CM | POA: Diagnosis not present

## 2018-08-06 DIAGNOSIS — K644 Residual hemorrhoidal skin tags: Secondary | ICD-10-CM | POA: Insufficient documentation

## 2018-08-06 DIAGNOSIS — F329 Major depressive disorder, single episode, unspecified: Secondary | ICD-10-CM | POA: Diagnosis not present

## 2018-08-06 DIAGNOSIS — K648 Other hemorrhoids: Secondary | ICD-10-CM | POA: Insufficient documentation

## 2018-08-06 DIAGNOSIS — K921 Melena: Secondary | ICD-10-CM

## 2018-08-06 HISTORY — PX: COLONOSCOPY WITH PROPOFOL: SHX5780

## 2018-08-06 SURGERY — COLONOSCOPY WITH PROPOFOL
Anesthesia: Monitor Anesthesia Care

## 2018-08-06 MED ORDER — CHLORHEXIDINE GLUCONATE CLOTH 2 % EX PADS: 6.0000 | MEDICATED_PAD | Freq: Once | CUTANEOUS | Status: DC

## 2018-08-06 MED ORDER — PROPOFOL 10 MG/ML IV BOLUS
INTRAVENOUS | Status: DC | PRN
  Administered 2018-08-06: 40 mg via INTRAVENOUS

## 2018-08-06 MED ORDER — PROPOFOL 500 MG/50ML IV EMUL
INTRAVENOUS | Status: DC | PRN
  Administered 2018-08-06: 150 ug/kg/min via INTRAVENOUS

## 2018-08-06 MED ORDER — MIDAZOLAM HCL 2 MG/2ML IJ SOLN
INTRAMUSCULAR | Status: AC
  Filled 2018-08-06: qty 2

## 2018-08-06 MED ORDER — LACTATED RINGERS IV SOLN
INTRAVENOUS | Status: DC
  Administered 2018-08-06: 1000 mL via INTRAVENOUS

## 2018-08-06 MED ORDER — PROPOFOL 10 MG/ML IV BOLUS
INTRAVENOUS | Status: AC
  Filled 2018-08-06: qty 40

## 2018-08-06 NOTE — Discharge Instructions (Signed)
Your RECTAL BLEEDING MAY BE DUE TO A ANAL FISSURE OR  SMALL internal hemorrhoids.   DRINK WATER TO KEEP YOUR URINE LIGHT YELLOW.  FOLLOW A HIGH FIBER DIET. AVOID ITEMS THAT CAUSE BLOATING & GAS. SEE INFO BELOW.  FOLLOW UP IN 6 MOS.   Next colonoscopy in 10 years.    Colonoscopy Care After Read the instructions outlined below and refer to this sheet in the next week. These discharge instructions provide you with general information on caring for yourself after you leave the hospital. While your treatment has been planned according to the most current medical practices available, unavoidable complications occasionally occur. If you have any problems or questions after discharge, call DR. Kati Riggenbach, [phone].  ACTIVITY  You may resume your regular activity, but move at a slower pace for the next 24 hours.   Take frequent rest periods for the next 24 hours.   Walking will help get rid of the air and reduce the bloated feeling in your belly (abdomen).   No driving for 24 hours (because of the medicine (anesthesia) used during the test).   You may shower.   Do not sign any important legal documents or operate any machinery for 24 hours (because of the anesthesia used during the test).    NUTRITION  Drink plenty of fluids.   You may resume your normal diet as instructed by your doctor.   Begin with a light meal and progress to your normal diet. Heavy or fried foods are harder to digest and may make you feel sick to your stomach (nauseated).   Avoid alcoholic beverages for 24 hours or as instructed.    MEDICATIONS  You may resume your normal medications.   WHAT YOU CAN EXPECT TODAY  Some feelings of bloating in the abdomen.   Passage of more gas than usual.   Spotting of blood in your stool or on the toilet paper  .  IF YOU HAD POLYPS REMOVED DURING THE COLONOSCOPY:  Eat a soft diet IF YOU HAVE NAUSEA, BLOATING, ABDOMINAL PAIN, OR VOMITING.    FINDING OUT THE  RESULTS OF YOUR TEST Not all test results are available during your visit. DR. Oneida Alar WILL CALL YOU WITHIN 14 DAYS OF YOUR PROCEDUE WITH YOUR RESULTS. Do not assume everything is normal if you have not heard from DR. Shaterrica Territo, CALL HER OFFICE AT [phone].  SEEK IMMEDIATE MEDICAL ATTENTION AND CALL THE OFFICE: [phone] IF:  You have more than a spotting of blood in your stool.   Your belly is swollen (abdominal distention).   You are nauseated or vomiting.   You have a temperature over 101F.   You have abdominal pain or discomfort that is severe or gets worse throughout the day.   High-Fiber Diet A high-fiber diet changes your normal diet to include more whole grains, legumes, fruits, and vegetables. Changes in the diet involve replacing refined carbohydrates with unrefined foods. The calorie level of the diet is essentially unchanged. The Dietary Reference Intake (recommended amount) for adult males is 38 grams per day. For adult females, it is 25 grams per day. Pregnant and lactating women should consume 28 grams of fiber per day. Fiber is the intact part of a plant that is not broken down during digestion. Functional fiber is fiber that has been isolated from the plant to provide a beneficial effect in the body.  PURPOSE  Increase stool bulk.   Ease and regulate bowel movements.   Lower cholesterol.   REDUCE RISK OF  COLON CANCER  INDICATIONS THAT YOU NEED MORE FIBER  Constipation and hemorrhoids.   Uncomplicated diverticulosis (intestine condition) and irritable bowel syndrome.   Weight management.   As a protective measure against hardening of the arteries (atherosclerosis), diabetes, and cancer.   GUIDELINES FOR INCREASING FIBER IN THE DIET  Start adding fiber to the diet slowly. A gradual increase of about 5 more grams (2 slices of whole-wheat bread, 2 servings of most fruits or vegetables, or 1 bowl of high-fiber cereal) per day is best. Too rapid an increase in  fiber may result in constipation, flatulence, and bloating.   Drink enough water and fluids to keep your urine clear or pale yellow. Water, juice, or caffeine-free drinks are recommended. Not drinking enough fluid may cause constipation.   Eat a variety of high-fiber foods rather than one type of fiber.   Try to increase your intake of fiber through using high-fiber foods rather than fiber pills or supplements that contain small amounts of fiber.   The goal is to change the types of food eaten. Do not supplement your present diet with high-fiber foods, but replace foods in your present diet.   INCLUDE A VARIETY OF FIBER SOURCES  Replace refined and processed grains with whole grains, canned fruits with fresh fruits, and incorporate other fiber sources. White rice, white breads, and most bakery goods contain little or no fiber.   Brown whole-grain rice, buckwheat oats, and many fruits and vegetables are all good sources of fiber. These include: broccoli, Brussels sprouts, cabbage, cauliflower, beets, sweet potatoes, white potatoes (skin on), carrots, tomatoes, eggplant, squash, berries, fresh fruits, and dried fruits.   Cereals appear to be the richest source of fiber. Cereal fiber is found in whole grains and bran. Bran is the fiber-rich outer coat of cereal grain, which is largely removed in refining. In whole-grain cereals, the bran remains. In breakfast cereals, the largest amount of fiber is found in those with "bran" in their names. The fiber content is sometimes indicated on the label.   You may need to include additional fruits and vegetables each day.   In baking, for 1 cup white flour, you may use the following substitutions:   1 cup whole-wheat flour minus 2 tablespoons.   1/2 cup white flour plus 1/2 cup whole-wheat flour.    Hemorrhoids Hemorrhoids are dilated (enlarged) veins around the rectum. Sometimes clots will form in the veins. This makes them swollen and painful. These  are called thrombosed hemorrhoids. Causes of hemorrhoids include:  Constipation.   Straining to have a bowel movement.   HEAVY LIFTING  HOME CARE INSTRUCTIONS  Eat a well balanced diet and drink 6 to 8 glasses of water every day to avoid constipation. You may also use a bulk laxative.   Avoid straining to have bowel movements.   Keep anal area dry and clean.   Do not use a donut shaped pillow or sit on the toilet for long periods. This increases blood pooling and pain.   Move your bowels when your body has the urge; this will require less straining and will decrease pain and pressure.

## 2018-08-06 NOTE — Anesthesia Preprocedure Evaluation (Signed)
Anesthesia Evaluation  Patient identified by MRN, date of birth, ID band Patient awake    Reviewed: Allergy & Precautions, H&P , NPO status , Patient's Chart, lab work & pertinent test results  Airway Mallampati: II  TM Distance: >3 FB Neck ROM: full    Dental no notable dental hx.    Pulmonary asthma ,    Pulmonary exam normal breath sounds clear to auscultation       Cardiovascular Exercise Tolerance: Good negative cardio ROS   Rhythm:regular Rate:Normal     Neuro/Psych  Headaches, PSYCHIATRIC DISORDERS Anxiety Depression    GI/Hepatic Neg liver ROS, GERD  ,  Endo/Other  negative endocrine ROS  Renal/GU negative Renal ROS  negative genitourinary   Musculoskeletal   Abdominal   Peds  Hematology  (+) Blood dyscrasia, anemia ,   Anesthesia Other Findings the possibility of a primary hepatic malignancy should be considered as an alternative to metastatic breast cancer.  Metastatic breast cancer  Reproductive/Obstetrics negative OB ROS                             Anesthesia Physical Anesthesia Plan  ASA: III  Anesthesia Plan: MAC   Post-op Pain Management:    Induction:   PONV Risk Score and Plan:   Airway Management Planned:   Additional Equipment:   Intra-op Plan:   Post-operative Plan:   Informed Consent: I have reviewed the patients History and Physical, chart, labs and discussed the procedure including the risks, benefits and alternatives for the proposed anesthesia with the patient or authorized representative who has indicated his/her understanding and acceptance.     Dental Advisory Given  Plan Discussed with: CRNA  Anesthesia Plan Comments:         Anesthesia Quick Evaluation

## 2018-08-06 NOTE — Anesthesia Postprocedure Evaluation (Signed)
Anesthesia Post Note  Patient: Pamela Long  Procedure(s) Performed: COLONOSCOPY WITH PROPOFOL (N/A )  Patient location during evaluation: PACU Anesthesia Type: MAC Level of consciousness: awake and alert and oriented Pain management: pain level controlled Vital Signs Assessment: post-procedure vital signs reviewed and stable Respiratory status: spontaneous breathing Cardiovascular status: blood pressure returned to baseline and stable Postop Assessment: no apparent nausea or vomiting Anesthetic complications: no     Last Vitals:  Vitals:   08/06/18 0929  BP: 115/79  Pulse: 90  Resp: 18  Temp: 36.9 C  SpO2: 96%    Last Pain:  Vitals:   08/06/18 0929  TempSrc: Oral  PainSc: 1                  Rionna Feltes

## 2018-08-06 NOTE — Transfer of Care (Signed)
Immediate Anesthesia Transfer of Care Note  Patient: Pamela Long  Procedure(s) Performed: COLONOSCOPY WITH PROPOFOL (N/A )  Patient Location: PACU  Anesthesia Type:MAC  Level of Consciousness: awake  Airway & Oxygen Therapy: Patient Spontanous Breathing  Post-op Assessment: Report given to RN  Post vital signs: Reviewed  Last Vitals:  Vitals Value Taken Time  BP 128/87 08/06/2018 12:30 PM  Temp    Pulse 100 08/06/2018 12:32 PM  Resp 16 08/06/2018 12:32 PM  SpO2 99 % 08/06/2018 12:32 PM  Vitals shown include unvalidated device data.  Last Pain:  Vitals:   08/06/18 0929  TempSrc: Oral  PainSc: 1       Patients Stated Pain Goal: 8 (32/91/91 6606)  Complications: No apparent anesthesia complications

## 2018-08-06 NOTE — Op Note (Signed)
[facility] Patient Name: Pamela Long Procedure Date: 08/06/2018 11:33 AM MRN: [id number] Date of Birth: Dec 19, 1970 Attending MD: Barney Drain MD, MD CSN: [id number] Age: 48 Admit Type: Outpatient Procedure:                Colonoscopy, DIAGNOSTIC Indications:              Hematochezia Providers:                Barney Drain MD, MD, Janeece Riggers, RN, Randa Spike, Technician Referring MD:             Doree Albee MD, MD Medicines:                Propofol per Anesthesia Complications:            No immediate complications. Estimated Blood Loss:     Estimated blood loss: none. Procedure:                Pre-Anesthesia Assessment:                           - Prior to the procedure, a History and Physical                            was performed, and patient medications and                            allergies were reviewed. The patient's tolerance of                            previous anesthesia was also reviewed. The risks                            and benefits of the procedure and the sedation                            options and risks were discussed with the patient.                            All questions were answered, and informed consent                            was obtained. Prior Anticoagulants: The patient has                            taken no previous anticoagulant or antiplatelet                            agents. ASA Grade Assessment: II - A patient with                            mild systemic disease. After reviewing the risks  and benefits, the patient was deemed in                            satisfactory condition to undergo the procedure.                            After obtaining informed consent, the colonoscope                            was passed under direct vision. Throughout the                            procedure, the patient's blood pressure, pulse, and                            oxygen  saturations were monitored continuously. The                            PCF-[id number] ([id number]) was introduced through the                            anus and advanced to the 10 cm into the ileum. The                            colonoscopy was somewhat difficult due to a                            tortuous colon. Successful completion of the                            procedure was aided by straightening and shortening                            the scope to obtain bowel loop reduction and                            COLOWRAP. The patient tolerated the procedure                            fairly well. The quality of the bowel preparation                            was good. The terminal ileum, ileocecal valve,                            appendiceal orifice, and rectum were photographed. Scope In: 12:09:16 PM Scope Out: 12:23:06 PM Scope Withdrawal Time: 0 hours 9 minutes 40 seconds  Total Procedure Duration: 0 hours 13 minutes 50 seconds  Findings:      The terminal ileum appeared normal.      The recto-sigmoid colon, sigmoid colon and descending colon were       moderately redundant.      Internal hemorrhoids were found. The hemorrhoids were small.      External hemorrhoids were found.  The hemorrhoids were moderate. Impression:               - The examined portion of the ileum was normal.                           - Redundant colon.                           - INTERMITTENT RECTL BLEEDING DUE TO ANAL FISSURE                            OR Internal hemorrhoids.                           - External hemorrhoids. Moderate Sedation:      Per Anesthesia Care Recommendation:           - Patient has a contact number available for                            emergencies. The signs and symptoms of potential                            delayed complications were discussed with the                            patient. Return to normal activities tomorrow.                            Written discharge  instructions were provided to the                            patient.                           - High fiber diet.                           - Continue present medications.                           - Await pathology results.                           - Repeat colonoscopy in 10 years for surveillance.                           - Return to GI office in 6 months. Procedure Code(s):        --- Professional ---                           [phone], Colonoscopy, flexible; diagnostic, including                            collection of specimen(s) by brushing or washing,  when performed (separate procedure) Diagnosis Code(s):        --- Professional ---                           K64.4, Residual hemorrhoidal skin tags                           K64.8, Other hemorrhoids                           K92.1, Melena (includes Hematochezia)                           Q43.8, Other specified congenital malformations of                            intestine CPT copyright 2018 American Medical Association. All rights reserved. The codes documented in this report are preliminary and upon coder review may  be revised to meet current compliance requirements. Barney Drain, MD Barney Drain MD, MD 08/06/2018 12:37:30 PM This report has been signed electronically. Number of Addenda: 0

## 2018-08-06 NOTE — H&P (Signed)
Primary Care Physician:  Doree Albee, MD Primary Gastroenterologist:  Dr. Oneida Alar  Pre-Procedure History & Physical: HPI:  Pamela Long is a 48 y.o. female here for rectal bleeding.  Past Medical History:  Diagnosis Date  . Anemia   . Anxiety   . Asthma   . Breast cancer ([place])    Left, 2017  . Depression   . GERD (gastroesophageal reflux disease)   . Migraine   . OAB (overactive bladder)   . Seasonal allergies     Past Surgical History:  Procedure Laterality Date  . ABDOMINAL HYSTERECTOMY     breast cancer  . ANKLE RECONSTRUCTION Right 1989  . BREAST SURGERY    . FOREIGN BODY REMOVAL N/A 08/29/2017   from lip  . KNEE SURGERY Right 1989   bone spur  . MASTECTOMY  2017   bil mastectomies  . SINUSOTOMY      Prior to Admission medications   Medication Sig Start Date End Date Taking? Authorizing Provider  abemaciclib (VERZENIO) 150 MG tablet Take 1 tablet (150 mg total) by mouth 2 (two) times daily. Swallow tablets whole. Do not chew, crush, or split tablets before swallowing. 07/30/18  Yes Derek Jack, MD  Cholecalciferol (VITAMIN D3 PO) Take 5 each by mouth daily.    Yes [provider]  donepezil (ARICEPT) 5 MG tablet Take 5 mg by mouth at bedtime.   Yes [provider]  escitalopram (LEXAPRO) 20 MG tablet Take 1 tablet (20 mg total) by mouth daily. 02/12/17  Yes Raylene Everts, MD  escitalopram (LEXAPRO) 20 MG tablet  07/25/18  Yes [provider]  fulvestrant (FASLODEX) 250 MG/5ML injection Inject 250 mg into the muscle every 14 (fourteen) days. One injection each buttock over 1-2 minutes. Warm prior to use.   Yes [provider]  gabapentin (NEURONTIN) 300 MG capsule Take 300 mg by mouth at bedtime.  03/04/18  Yes [provider]  loperamide (IMODIUM) 2 MG capsule Take 2-4 mg by mouth daily as needed for diarrhea or loose stools.   Yes [provider]  naproxen sodium (ALEVE) 220 MG tablet Take  220-440 mg by mouth 2 (two) times daily as needed (for pain or headache).    Yes [provider]  ondansetron (ZOFRAN) 4 MG tablet Take 1 tablet (4 mg total) by mouth every 8 (eight) hours as needed for nausea or vomiting. 06/17/18  Yes Hayden Rasmussen, MD  oxyCODONE (OXY IR/ROXICODONE) 5 MG immediate release tablet Take 1 tablet (5 mg total) by mouth every 8 (eight) hours as needed for severe pain. 07/16/18  Yes Lockamy, Randi L, NP-C  pantoprazole (PROTONIX) 40 MG tablet TAKE 1 TABLET BY MOUTH DAILY BEFORE BREAKFAST Patient taking differently: Take 40 mg by mouth daily before breakfast.  06/26/18  Yes Annitta Needs, NP  Polyvinyl Alcohol-Povidone PF (REFRESH) 1.4-0.6 % SOLN Place 1 drop into both eyes daily as needed (for dry eyes).   Yes [provider]  rizatriptan (MAXALT-MLT) 10 MG disintegrating tablet Take 10 mg by mouth every 2 (two) hours as needed for migraine.    Yes [provider]  TROKENDI XR 50 MG CP24 Take 50 mg by mouth 2 (two) times daily.   Yes [provider]  oxyCODONE-acetaminophen (PERCOCET/ROXICET) 5-325 MG tablet Take 1-2 tablets by mouth every 6 (six) hours as needed for severe pain. Patient not taking: Reported on 07/21/2018 06/17/18   Hayden Rasmussen, MD    Allergies as of 07/09/2018 -  Review Complete 07/08/2018  Allergen Reaction Noted  . Corn-containing products Other (See Comments) 08/29/2017  . Tape Itching 06/17/2018  . Amoxicillin Hives 02/12/2017  . Caffeine Diarrhea, Nausea Only, and Palpitations 01/16/2017  . Tetanus toxoids Swelling 02/12/2017    Family History  Problem Relation Age of Onset  . Arthritis Mother   . COPD Mother   . Depression Mother   . Diabetes Mother   . Kidney disease Father   . Heart disease Father 39  . Drug abuse Father   . Hypertension Father   . Diabetes Daughter   . Hashimoto's thyroiditis Daughter   . Irritable bowel syndrome Daughter   . Autism spectrum disorder Daughter   . Early  death Maternal Grandmother        drowned  . Early death Maternal Grandfather   . Heart disease Maternal Grandfather   . Heart disease Paternal Grandfather   . Breast cancer Maternal Aunt   . Breast cancer Cousin   . Lung cancer Maternal Aunt   . Lung cancer Maternal Uncle   . Colon cancer Neg Hx     Social History   Socioeconomic History  . Marital status: Single    Spouse name: Not on file  . Number of children: 1  . Years of education: 26  . Highest education level: Not on file  Occupational History  . Occupation: disabled  Social Needs  . Financial resource strain: Somewhat hard  . Food insecurity:    Worry: Sometimes true    Inability: Sometimes true  . Transportation needs:    Medical: No    Non-medical: No  Tobacco Use  . Smoking status: Never Smoker  . Smokeless tobacco: Never Used  Substance and Sexual Activity  . Alcohol use: No  . Drug use: No  . Sexual activity: Not Currently  Lifestyle  . Physical activity:    Days per week: 0 days    Minutes per session: 0 min  . Stress: Rather much  Relationships  . Social connections:    Talks on phone: Once a week    Gets together: Once a week    Attends religious service: 1 to 4 times per year    Active member of club or organization: Yes    Attends meetings of clubs or organizations: Never    Relationship status: Never married  . Intimate partner violence:    Fear of current or ex partner: No    Emotionally abused: No    Physically abused: No    Forced sexual activity: No  Other Topics Concern  . Not on file  Social History Narrative   Bachelors degree   Accounting   Lives with daughter [name] who has autism and diabetes   Likes to sew, quilt, crafts, crochet    Review of Systems: See HPI, otherwise negative ROS   Physical Exam: BP 115/79   Pulse 90   Temp 98.5 F (36.9 C) (Oral)   Resp 18   Ht 5' 5.5" (1.664 m)   Wt 91.5 kg   SpO2 96%   BMI 33.06 kg/m  General:   Alert,  pleasant and  cooperative in NAD Head:  Normocephalic and atraumatic. Neck:  Supple; Lungs:  Clear throughout to auscultation.    Heart:  Regular rate and rhythm. Abdomen:  Soft, nontender and nondistended. Normal bowel sounds, without guarding, and without rebound.   Neurologic:  Alert and  oriented x4;  grossly normal neurologically.  Impression/Plan:    BRBPR  PLAN:  TCS TODAY. DISCUSSED PROCEDURE, BENEFITS, & RISKS: < 1% chance of medication reaction, bleeding, perforation, or rupture of spleen/liver.

## 2018-08-10 ENCOUNTER — Encounter (HOSPITAL_COMMUNITY): Payer: Self-pay | Admitting: Gastroenterology

## 2018-08-12 ENCOUNTER — Other Ambulatory Visit: Payer: Self-pay

## 2018-08-12 ENCOUNTER — Inpatient Hospital Stay (HOSPITAL_COMMUNITY): Payer: 59

## 2018-08-12 DIAGNOSIS — K219 Gastro-esophageal reflux disease without esophagitis: Secondary | ICD-10-CM | POA: Diagnosis not present

## 2018-08-12 DIAGNOSIS — J45909 Unspecified asthma, uncomplicated: Secondary | ICD-10-CM | POA: Diagnosis not present

## 2018-08-12 DIAGNOSIS — Z803 Family history of malignant neoplasm of breast: Secondary | ICD-10-CM | POA: Diagnosis not present

## 2018-08-12 DIAGNOSIS — Z801 Family history of malignant neoplasm of trachea, bronchus and lung: Secondary | ICD-10-CM | POA: Diagnosis not present

## 2018-08-12 DIAGNOSIS — R740 Nonspecific elevation of levels of transaminase and lactic acid dehydrogenase [LDH]: Secondary | ICD-10-CM | POA: Diagnosis not present

## 2018-08-12 DIAGNOSIS — C787 Secondary malignant neoplasm of liver and intrahepatic bile duct: Secondary | ICD-10-CM | POA: Diagnosis not present

## 2018-08-12 DIAGNOSIS — F329 Major depressive disorder, single episode, unspecified: Secondary | ICD-10-CM | POA: Diagnosis not present

## 2018-08-12 DIAGNOSIS — C50112 Malignant neoplasm of central portion of left female breast: Secondary | ICD-10-CM | POA: Diagnosis not present

## 2018-08-12 DIAGNOSIS — R944 Abnormal results of kidney function studies: Secondary | ICD-10-CM | POA: Diagnosis not present

## 2018-08-12 DIAGNOSIS — Z170 Estrogen receptor positive status [ER+]: Secondary | ICD-10-CM | POA: Diagnosis not present

## 2018-08-12 DIAGNOSIS — Z9012 Acquired absence of left breast and nipple: Secondary | ICD-10-CM | POA: Diagnosis not present

## 2018-08-12 DIAGNOSIS — M549 Dorsalgia, unspecified: Secondary | ICD-10-CM | POA: Diagnosis not present

## 2018-08-12 DIAGNOSIS — D649 Anemia, unspecified: Secondary | ICD-10-CM | POA: Diagnosis not present

## 2018-08-12 DIAGNOSIS — Z79899 Other long term (current) drug therapy: Secondary | ICD-10-CM | POA: Diagnosis not present

## 2018-08-12 LAB — COMPREHENSIVE METABOLIC PANEL
ALT: 31 U/L (ref 0–44)
ANION GAP: 9 (ref 5–15)
AST: 148 U/L — ABNORMAL HIGH (ref 15–41)
Albumin: 3.8 g/dL (ref 3.5–5.0)
Alkaline Phosphatase: 65 U/L (ref 38–126)
BUN: 22 mg/dL — ABNORMAL HIGH (ref 6–20)
CO2: 24 mmol/L (ref 22–32)
Calcium: 9.4 mg/dL (ref 8.9–10.3)
Chloride: 106 mmol/L (ref 98–111)
Creatinine, Ser: 1.61 mg/dL — ABNORMAL HIGH (ref 0.44–1.00)
GFR calc Af Amer: 43 mL/min — ABNORMAL LOW (ref >60)
GFR calc non Af Amer: 37 mL/min — ABNORMAL LOW (ref >60)
Glucose, Bld: 85 mg/dL (ref 70–99)
POTASSIUM: 4.2 mmol/L (ref 3.5–5.1)
SODIUM: 139 mmol/L (ref 135–145)
Total Bilirubin: 0.4 mg/dL (ref 0.3–1.2)
Total Protein: 8.1 g/dL (ref 6.5–8.1)

## 2018-08-12 LAB — CBC WITH DIFFERENTIAL/PLATELET
Abs Immature Granulocytes: 0.01 10*3/uL (ref 0.00–0.07)
BASOS PCT: 2 %
Basophils Absolute: 0.1 10*3/uL (ref 0.0–0.1)
Eosinophils Absolute: 0.3 10*3/uL (ref 0.0–0.5)
Eosinophils Relative: 5 %
HCT: 33.5 % — ABNORMAL LOW (ref 36.0–46.0)
Hemoglobin: 10.7 g/dL — ABNORMAL LOW (ref 12.0–15.0)
Immature Granulocytes: 0 %
Lymphocytes Relative: 24 %
Lymphs Abs: 1.2 10*3/uL (ref 0.7–4.0)
MCH: 28.8 pg (ref 26.0–34.0)
MCHC: 31.9 g/dL (ref 30.0–36.0)
MCV: 90.1 fL (ref 80.0–100.0)
Monocytes Absolute: 0.4 10*3/uL (ref 0.1–1.0)
Monocytes Relative: 8 %
Neutro Abs: 3.2 10*3/uL (ref 1.7–7.7)
Neutrophils Relative %: 61 %
Platelets: 130 10*3/uL — ABNORMAL LOW (ref 150–400)
RBC: 3.72 MIL/uL — AB (ref 3.87–5.11)
RDW: 14.2 % (ref 11.5–15.5)
WBC: 5.1 10*3/uL (ref 4.0–10.5)
nRBC: 0 % (ref 0.0–0.2)

## 2018-08-12 LAB — MAGNESIUM: Magnesium: 2.1 mg/dL (ref 1.7–2.4)

## 2018-08-14 ENCOUNTER — Other Ambulatory Visit: Payer: Self-pay

## 2018-08-14 ENCOUNTER — Encounter (HOSPITAL_COMMUNITY): Payer: Self-pay | Admitting: *Deleted

## 2018-08-14 ENCOUNTER — Inpatient Hospital Stay (HOSPITAL_BASED_OUTPATIENT_CLINIC_OR_DEPARTMENT_OTHER): Payer: 59 | Admitting: Hematology

## 2018-08-14 ENCOUNTER — Encounter (HOSPITAL_COMMUNITY): Payer: Self-pay | Admitting: Hematology

## 2018-08-14 ENCOUNTER — Other Ambulatory Visit (HOSPITAL_COMMUNITY): Payer: 59

## 2018-08-14 VITALS — BP 117/82 | HR 87 | Temp 98.6°F | Resp 18 | Wt 200.0 lb

## 2018-08-14 DIAGNOSIS — K219 Gastro-esophageal reflux disease without esophagitis: Secondary | ICD-10-CM

## 2018-08-14 DIAGNOSIS — J45909 Unspecified asthma, uncomplicated: Secondary | ICD-10-CM

## 2018-08-14 DIAGNOSIS — Z803 Family history of malignant neoplasm of breast: Secondary | ICD-10-CM

## 2018-08-14 DIAGNOSIS — C50919 Malignant neoplasm of unspecified site of unspecified female breast: Secondary | ICD-10-CM

## 2018-08-14 DIAGNOSIS — R944 Abnormal results of kidney function studies: Secondary | ICD-10-CM

## 2018-08-14 DIAGNOSIS — C787 Secondary malignant neoplasm of liver and intrahepatic bile duct: Secondary | ICD-10-CM | POA: Diagnosis not present

## 2018-08-14 DIAGNOSIS — Z801 Family history of malignant neoplasm of trachea, bronchus and lung: Secondary | ICD-10-CM

## 2018-08-14 DIAGNOSIS — Z79899 Other long term (current) drug therapy: Secondary | ICD-10-CM

## 2018-08-14 DIAGNOSIS — R740 Nonspecific elevation of levels of transaminase and lactic acid dehydrogenase [LDH]: Secondary | ICD-10-CM

## 2018-08-14 DIAGNOSIS — M549 Dorsalgia, unspecified: Secondary | ICD-10-CM

## 2018-08-14 DIAGNOSIS — C50112 Malignant neoplasm of central portion of left female breast: Secondary | ICD-10-CM | POA: Diagnosis not present

## 2018-08-14 DIAGNOSIS — F329 Major depressive disorder, single episode, unspecified: Secondary | ICD-10-CM

## 2018-08-14 DIAGNOSIS — D649 Anemia, unspecified: Secondary | ICD-10-CM

## 2018-08-14 DIAGNOSIS — Z170 Estrogen receptor positive status [ER+]: Secondary | ICD-10-CM

## 2018-08-14 DIAGNOSIS — Z9012 Acquired absence of left breast and nipple: Secondary | ICD-10-CM

## 2018-08-14 NOTE — Patient Instructions (Addendum)
[name] Discharge Instructions  You were seen today by Dr. [name], he went over how you've been feeling and the pain that you've been having. He thinks that your tiredness and weakness is coming from your medication.  He reviewed you recent test result and they were stable. We will see you back in 2 weeks for labs, Faslodex injection and follow up.     Thank you for choosing [facility] at [facility] to provide your oncology and hematology care.  To afford each patient quality time with our provider, please arrive at least 15 minutes before your scheduled appointment time.   If you have a lab appointment with the [facility] please come in thru the  Main Entrance and check in at the main information desk  You need to re-schedule your appointment should you arrive 10 or more minutes late.  We strive to give you quality time with our providers, and arriving late affects you and other patients whose appointments are after yours.  Also, if you no show three or more times for appointments you may be dismissed from the clinic at the providers discretion.     Again, thank you for choosing [facility].  Our hope is that these requests will decrease the amount of time that you wait before being seen by our physicians.       _____________________________________________________________  Should you have questions after your visit to [facility], please contact our office at [phone] between the hours of 8:00 a.m. and 4:30 p.m.  Voicemails left after 4:00 p.m. will not be returned until the following business day.  For prescription refill requests, have your pharmacy contact our office and allow 72 hours.    Cancer Center Support Programs:   > Cancer Support Group  2nd Tuesday of the month 1pm-2pm, Journey Room

## 2018-08-14 NOTE — Progress Notes (Signed)
Pamela Long, [place] [address]   CLINIC:  Medical Oncology/Hematology  PCP:  Doree Albee, MD [place] [address] [phone]   REASON FOR VISIT: Follow-up for Metastatic breast cancer to the liver   CURRENT THERAPY:  Abemaciclib 150 mg twice daily.   INTERVAL HISTORY:  Pamela Long 48 y.o. female returns for follow-up of metastatic breast cancer.  Her main complaint is fatigue.  She occasionally falls asleep during the afternoons.  She is continuing to work 5 days a week.  She also has pains around her lower chest wall and back.  She reported chest pain 10 days ago on the right side.  A CT chest PE protocol was done which was negative.  She does report low back pains also.  She is taking ibuprofen 800 mg in the mornings.  She is not taking oxycodone 5 mg during daytime as she is afraid that she will feel lightheaded and cannot drive.  Appetite is 50%.  Energy levels are 25%.  Denies any diarrhea.  Has abdominal cramping in the mornings.  She is not requiring any Imodium on a regular basis.    REVIEW OF SYSTEMS:  Review of Systems  Constitutional: Positive for fatigue.  Musculoskeletal: Positive for back pain.     PAST MEDICAL/SURGICAL HISTORY:  Past Medical History:  Diagnosis Date  . Anemia   . Anxiety   . Asthma   . Breast cancer ([place])    Left, 2017  . Depression   . GERD (gastroesophageal reflux disease)   . Migraine   . OAB (overactive bladder)   . Seasonal allergies    Past Surgical History:  Procedure Laterality Date  . ABDOMINAL HYSTERECTOMY     breast cancer  . ANKLE RECONSTRUCTION Right 1989  . BREAST SURGERY    . COLONOSCOPY WITH PROPOFOL N/A 08/06/2018   Procedure: COLONOSCOPY WITH PROPOFOL;  Surgeon: Danie Binder, MD;  Location: [facility];  Service: Endoscopy;  Laterality: N/A;  1:30pm  . FOREIGN BODY REMOVAL N/A 08/29/2017   from lip  . KNEE SURGERY Right 1989   bone spur  .  MASTECTOMY  2017   bil mastectomies  . SINUSOTOMY       SOCIAL HISTORY:  Social History   Socioeconomic History  . Marital status: Single    Spouse name: Not on file  . Number of children: 1  . Years of education: 32  . Highest education level: Not on file  Occupational History  . Occupation: disabled  Social Needs  . Financial resource strain: Somewhat hard  . Food insecurity:    Worry: Sometimes true    Inability: Sometimes true  . Transportation needs:    Medical: No    Non-medical: No  Tobacco Use  . Smoking status: Never Smoker  . Smokeless tobacco: Never Used  Substance and Sexual Activity  . Alcohol use: No  . Drug use: No  . Sexual activity: Not Currently  Lifestyle  . Physical activity:    Days per week: 0 days    Minutes per session: 0 min  . Stress: Rather much  Relationships  . Social connections:    Talks on phone: Once a week    Gets together: Once a week    Attends religious service: 1 to 4 times per year    Active member of club or organization: Yes    Attends meetings of clubs or organizations: Never    Relationship  status: Never married  . Intimate partner violence:    Fear of current or ex partner: No    Emotionally abused: No    Physically abused: No    Forced sexual activity: No  Other Topics Concern  . Not on file  Social History Narrative   Bachelors degree   Accounting   Lives with daughter [name] who has autism and diabetes   Likes to sew, quilt, crafts, crochet    FAMILY HISTORY:  Family History  Problem Relation Age of Onset  . Arthritis Mother   . COPD Mother   . Depression Mother   . Diabetes Mother   . Kidney disease Father   . Heart disease Father 21  . Drug abuse Father   . Hypertension Father   . Diabetes Daughter   . Hashimoto's thyroiditis Daughter   . Irritable bowel syndrome Daughter   . Autism spectrum disorder Daughter   . Early death Maternal Grandmother        drowned  . Early death Maternal  Grandfather   . Heart disease Maternal Grandfather   . Heart disease Paternal Grandfather   . Breast cancer Maternal Aunt   . Breast cancer Cousin   . Lung cancer Maternal Aunt   . Lung cancer Maternal Uncle   . Colon cancer Neg Hx     CURRENT MEDICATIONS:  Outpatient Encounter Medications as of 08/14/2018  Medication Sig Note  . abemaciclib (VERZENIO) 150 MG tablet Take 1 tablet (150 mg total) by mouth 2 (two) times daily. Swallow tablets whole. Do not chew, crush, or split tablets before swallowing.   . Cholecalciferol (VITAMIN D3 PO) Take 5 each by mouth daily.  07/21/2018: Chewable  . donepezil (ARICEPT) 5 MG tablet Take 5 mg by mouth at bedtime.   Marland Kitchen escitalopram (LEXAPRO) 20 MG tablet Take 1 tablet (20 mg total) by mouth daily.   Marland Kitchen escitalopram (LEXAPRO) 20 MG tablet    . fulvestrant (FASLODEX) 250 MG/5ML injection Inject 250 mg into the muscle every 14 (fourteen) days. One injection each buttock over 1-2 minutes. Warm prior to use.   . gabapentin (NEURONTIN) 300 MG capsule Take 300 mg by mouth at bedtime.    Marland Kitchen loperamide (IMODIUM) 2 MG capsule Take 2-4 mg by mouth daily as needed for diarrhea or loose stools.   . naproxen sodium (ALEVE) 220 MG tablet Take 220-440 mg by mouth 2 (two) times daily as needed (for pain or headache).    . ondansetron (ZOFRAN) 4 MG tablet Take 1 tablet (4 mg total) by mouth every 8 (eight) hours as needed for nausea or vomiting.   Marland Kitchen oxyCODONE (OXY IR/ROXICODONE) 5 MG immediate release tablet Take 1 tablet (5 mg total) by mouth every 8 (eight) hours as needed for severe pain.   . pantoprazole (PROTONIX) 40 MG tablet TAKE 1 TABLET BY MOUTH DAILY BEFORE BREAKFAST (Patient taking differently: Take 40 mg by mouth daily before breakfast. )   . Polyvinyl Alcohol-Povidone PF (REFRESH) 1.4-0.6 % SOLN Place 1 drop into both eyes daily as needed (for dry eyes).   . rizatriptan (MAXALT-MLT) 10 MG disintegrating tablet Take 10 mg by mouth every 2 (two) hours as needed for  migraine.    Marland Kitchen TROKENDI XR 50 MG CP24 Take 50 mg by mouth 2 (two) times daily.   . [DISCONTINUED] oxyCODONE-acetaminophen (PERCOCET/ROXICET) 5-325 MG tablet Take 1-2 tablets by mouth every 6 (six) hours as needed for severe pain. (Patient not taking: Reported on 07/21/2018)    No  facility-administered encounter medications on file as of 08/14/2018.     ALLERGIES:  Allergies  Allergen Reactions  . Corn-Containing Products Other (See Comments)    Headache and GI upset  . Tape Itching and Other (See Comments)    Depending on the adhesive-blistering occurs  . Amoxicillin Hives and Other (See Comments)    Rash only DID THE REACTION INVOLVE: Swelling of the face/tongue/throat, SOB, or low BP? Sudden or severe rash/hives, skin peeling, or the inside of the mouth or nose?  Did it require medical treatment?  When did it last happen? If all above answers are "NO", may proceed with cephalosporin use.   . Caffeine Diarrhea, Nausea Only, Palpitations and Other (See Comments)    Headache  . Tetanus Toxoids Swelling and Other (See Comments)    Local reaction     PHYSICAL EXAM:  ECOG Performance status: 1  Vitals:   08/14/18 1120  BP: 117/82  Pulse: 87  Resp: 18  Temp: 98.6 F (37 C)  SpO2: 96%   Filed Weights   08/14/18 1120  Weight: 200 lb (90.7 kg)    Physical Exam Constitutional:      Appearance: Normal appearance. She is normal weight.  Cardiovascular:     Rate and Rhythm: Normal rate and regular rhythm.     Heart sounds: Normal heart sounds.  Pulmonary:     Effort: Pulmonary effort is normal.     Breath sounds: Normal breath sounds.  Abdominal:     General: Abdomen is flat.     Palpations: Abdomen is soft.  Musculoskeletal: Normal range of motion.  Skin:    General: Skin is warm and dry.  Neurological:     Mental Status: She is alert and oriented to person, place, and time. Mental status is at baseline.  Psychiatric:        Mood and Affect: Mood normal.         Behavior: Behavior normal.        Thought Content: Thought content normal.        Judgment: Judgment normal.   Abdomen: Liver palpable 2 inches below the right costal margin.   LABORATORY DATA:  I have reviewed the labs as listed.  CBC    Component Value Date/Time   WBC 5.1 08/12/2018 1431   RBC 3.72 (L) 08/12/2018 1431   HGB 10.7 (L) 08/12/2018 1431   HCT 33.5 (L) 08/12/2018 1431   PLT 130 (L) 08/12/2018 1431   MCV 90.1 08/12/2018 1431   MCH 28.8 08/12/2018 1431   MCHC 31.9 08/12/2018 1431   RDW 14.2 08/12/2018 1431   LYMPHSABS 1.2 08/12/2018 1431   MONOABS 0.4 08/12/2018 1431   EOSABS 0.3 08/12/2018 1431   BASOSABS 0.1 08/12/2018 1431   CMP Latest Ref Rng & Units 08/12/2018 07/29/2018 07/22/2018  Glucose 70 - 99 mg/dL 85 95 81  BUN 6 - 20 mg/dL 22(H) 18 23(H)  Creatinine 0.44 - 1.00 mg/dL 1.61(H) 1.40(H) 1.40(H)  Sodium 135 - 145 mmol/L 139 141 136  Potassium 3.5 - 5.1 mmol/L 4.2 3.8 3.8  Chloride 98 - 111 mmol/L 106 106 105  CO2 22 - 32 mmol/L 24 25 21(L)  Calcium 8.9 - 10.3 mg/dL 9.4 9.5 9.5  Total Protein 6.5 - 8.1 g/dL 8.1 8.0 7.8  Total Bilirubin 0.3 - 1.2 mg/dL 0.4 0.5 0.5  Alkaline Phos 38 - 126 U/L 65 53 56  AST 15 - 41 U/L 148(H) 134(H) 116(H)  ALT 0 - 44 U/L 31 32 30  DIAGNOSTIC IMAGING:  I have independently reviewed the scans and discussed with the patient.    ASSESSMENT & PLAN:   Metastatic breast cancer ([facility]) 1.  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 -Underwent neoadjuvant chemotherapy with dose dense AC followed by Taxol from 07/14/2015 through 11/29/2015. - Left lumpectomy plus SLNB, consistent with infiltrating lobular carcinoma, pleomorphic features, ER/PR positive, HER-2 negative, 0 out of 3 lymph nodes positive. - Zoladex 3.6 mg IM monthly started in August 2017 followed by prophylactic right  breast mastectomy plus completion of left breast simple mastectomy, radiation not indicated. -Zoladex stopped due to joint pain, restarted in October 2017, exemestane started in January 2018 - Exemestane and Zoladex held secondary to joint pains, TAH and BSO in April 2018, exemestane 25 mg daily -Moved to [place] from [place] in March 2018, and was seeing Dr. Zenovia Jordan at [place]. -Genetic testing with multicolor gene panel was negative. -In August 2018 she was switched to tamoxifen 20 mg daily.  In November 2019 she was switched to anastrozole and she took only for few weeks. -Patient started having epigastric and right upper quadrant pains in mid December 2019. -CT CAP on 06/17/2018 shows very large hepatic mass measuring 22 cm, mildly enlarged left internal mammary node at 0.8 cm, borderline enlarged left lower neck level 4 node at 0.9 cm. - Biopsy of the liver mass on 06/26/2018 showed breast cancer, ER/PR positive and HER-2 negative. -Brain MRI in January 2020 was also negative for metastatic disease. -Faslodex started on 07/02/2018 and Verzenio 150 mg twice daily started on 07/07/2018.   -PET CT scan on 07/13/2018 shows very large centrally necrotic mass involving the right and left hepatic lobes, one other hypermetabolic lesion in the left lobe.  Multiple hypermetabolic lymph nodes in the upper abdomen, left supraclavicular space.  No thoracic adenopathy, chest wall mass or pulmonary or osseous lesions. - She complained of right-sided chest wall pain.  A CT scan of the chest PE protocol on 08/03/2018 did not demonstrate any pulmonary embolus.  No edema or consolidation.  Stable subcentimeter left supraclavicular lymph nodes present. - She is continuing to have pains around her lower chest wall.  I think this is likely from her liver lesion.  She does have occasional low back pain.  Abemaciclib can also cause arthralgias. -She is taking ibuprofen 800 mg in the mornings.  She is afraid  that she will feel drowsy if she takes oxycodone 5 mg.  She is taking it only at nighttime. -I have recommended her to try tramadol 50 mg and see if she can tolerate it without getting drowsy.  We will send a prescription to her pharmacy. -She also has fatigue and falls asleep during afternoons.  This is likely coming from Abemaciclib.  I would continue the same dose of abemaciclib 150 mg twice daily at this time.  We plan to do scans end of March or first week of April.  If they show good response, we can consider reducing the dose 200 mg twice daily. -I will reevaluate her in 2 weeks with repeat labs.  She will also get her Faslodex injection at that time.  2.  Cytopenias: -She has mild thrombocytopenia and anemia since the start of abemaciclib.  We will closely monitor it.  3.  Intermittent rectal bleeding: - She had a colonoscopy on 08/06/2018 which showed normal ileum, redundant  colon, internal and external hemorrhoids. - She does not have any bleeding if she is not constipated.  4.  Elevated creatinine: - It is elevated at 1.6 today.  This is from Abemaciclib.  We will closely monitor it.       Orders placed this encounter:  Orders Placed This Encounter  Procedures  . CBC with Differential/Platelet  . Comprehensive metabolic panel  . Cancer antigen 27.29  . Cancer antigen 15-3      Derek Jack, MD [place] [phone]

## 2018-08-14 NOTE — Progress Notes (Signed)
I received notification from [facility] that they still have not received the specimen from [facility] for testing.   I called [facility] and spoke with Ebony Hail that confirmed that the specimen [id number] was sent out on today 3/6 @ 150pm by technician Lesly Rubenstein to [facility].

## 2018-08-14 NOTE — Assessment & Plan Note (Signed)
1.  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 -Underwent neoadjuvant chemotherapy with dose dense AC followed by Taxol from 07/14/2015 through 11/29/2015. - Left lumpectomy plus SLNB, consistent with infiltrating lobular carcinoma, pleomorphic features, ER/PR positive, HER-2 negative, 0 out of 3 lymph nodes positive. - Zoladex 3.6 mg IM monthly started in August 2017 followed by prophylactic right breast mastectomy plus completion of left breast simple mastectomy, radiation not indicated. -Zoladex stopped due to joint pain, restarted in October 2017, exemestane started in January 2018 - Exemestane and Zoladex held secondary to joint pains, TAH and BSO in April 2018, exemestane 25 mg daily -Moved to [place] from [place] in March 2018, and was seeing Dr. Zenovia Jordan at [place]. -Genetic testing with multicolor gene panel was negative. -In August 2018 she was switched to tamoxifen 20 mg daily.  In November 2019 she was switched to anastrozole and she took only for few weeks. -Patient started having epigastric and right upper quadrant pains in mid December 2019. -CT CAP on 06/17/2018 shows very large hepatic mass measuring 22 cm, mildly enlarged left internal mammary node at 0.8 cm, borderline enlarged left lower neck level 4 node at 0.9 cm. - Biopsy of the liver mass on 06/26/2018 showed breast cancer, ER/PR positive and HER-2 negative. -Brain MRI in January 2020 was also negative for metastatic disease. -Faslodex started on 07/02/2018 and Verzenio 150 mg twice daily started on 07/07/2018.   -PET CT scan on 07/13/2018 shows very large centrally necrotic mass involving the right and left hepatic lobes, one other hypermetabolic lesion in the left lobe.  Multiple hypermetabolic lymph nodes in the upper abdomen, left supraclavicular space.  No thoracic adenopathy,  chest wall mass or pulmonary or osseous lesions. - She complained of right-sided chest wall pain.  A CT scan of the chest PE protocol on 08/03/2018 did not demonstrate any pulmonary embolus.  No edema or consolidation.  Stable subcentimeter left supraclavicular lymph nodes present. - She is continuing to have pains around her lower chest wall.  I think this is likely from her liver lesion.  She does have occasional low back pain.  Abemaciclib can also cause arthralgias. -She is taking ibuprofen 800 mg in the mornings.  She is afraid that she will feel drowsy if she takes oxycodone 5 mg.  She is taking it only at nighttime. -I have recommended her to try tramadol 50 mg and see if she can tolerate it without getting drowsy.  We will send a prescription to her pharmacy. -She also has fatigue and falls asleep during afternoons.  This is likely coming from Abemaciclib.  I would continue the same dose of abemaciclib 150 mg twice daily at this time.  We plan to do scans end of March or first week of April.  If they show good response, we can consider reducing the dose 200 mg twice daily. -I will reevaluate her in 2 weeks with repeat labs.  She will also get her Faslodex injection at that time.  2.  Cytopenias: -She has mild thrombocytopenia and anemia since the start of abemaciclib.  We will closely monitor it.  3.  Intermittent rectal bleeding: - She had a colonoscopy on 08/06/2018 which showed normal ileum, redundant colon, internal and external hemorrhoids. - She does not have any bleeding if she is not constipated.  4.  Elevated creatinine: - It is elevated at  1.6 today.  This is from Abemaciclib.  We will closely monitor it.

## 2018-08-18 ENCOUNTER — Encounter (HOSPITAL_COMMUNITY): Payer: Self-pay

## 2018-08-19 ENCOUNTER — Telehealth (HOSPITAL_COMMUNITY): Payer: Self-pay | Admitting: Surgery

## 2018-08-19 ENCOUNTER — Other Ambulatory Visit (HOSPITAL_COMMUNITY): Payer: Self-pay | Admitting: Hematology

## 2018-08-19 MED ORDER — TRAMADOL HCL 50 MG PO TABS: 50.0000 mg | ORAL_TABLET | Freq: Two times a day (BID) | ORAL | 0 refills | Status: DC

## 2018-08-19 NOTE — Telephone Encounter (Signed)
Notified pt per Diane's request that her FMLA papers have been completed and faxed. Pt verbalized understanding.

## 2018-08-24 ENCOUNTER — Encounter (HOSPITAL_COMMUNITY): Payer: Self-pay | Admitting: *Deleted

## 2018-08-24 NOTE — Progress Notes (Signed)
I received written notification from [facility] that there was not enough tissue for testing in the sample that we sent.  I have notified Dr. [name], no further orders.

## 2018-08-26 ENCOUNTER — Encounter (HOSPITAL_COMMUNITY): Payer: Self-pay

## 2018-08-26 ENCOUNTER — Telehealth (HOSPITAL_COMMUNITY): Payer: Self-pay | Admitting: *Deleted

## 2018-08-26 ENCOUNTER — Other Ambulatory Visit (HOSPITAL_COMMUNITY): Payer: Self-pay | Admitting: *Deleted

## 2018-08-26 DIAGNOSIS — C50919 Malignant neoplasm of unspecified site of unspecified female breast: Secondary | ICD-10-CM

## 2018-08-26 DIAGNOSIS — C50112 Malignant neoplasm of central portion of left female breast: Secondary | ICD-10-CM

## 2018-08-26 DIAGNOSIS — Z170 Estrogen receptor positive status [ER+]: Principal | ICD-10-CM

## 2018-08-26 MED ORDER — ABEMACICLIB 150 MG PO TABS: 150.0000 mg | ORAL_TABLET | Freq: Two times a day (BID) | ORAL | 0 refills | Status: DC

## 2018-08-26 NOTE — Telephone Encounter (Signed)
Returned patient's call and she states that she is having excruciating pain in her back and pain when she takes a deep breath.  She states that she has been extremely weak and tired and stays curled up in agony.  She states that the pain is worse when she is sitting, driving.  She can't sleep well on her back or left side due to the pain. She states that the oxycodone makes her sleepy and she can't drive with it, the tramadol helps the pain but also makes her sleepy and the aleve she is taking in the mornings but it isn't helping at all. She called wanting some guidance on pain medication regimen.  I will talk with Dr. [name] about this and call patient back with his recommendations.

## 2018-08-26 NOTE — Telephone Encounter (Signed)
I spoke with Dr. [name] and orders have been placed. Patient will be notified by our schedulers of her appointment.

## 2018-08-27 ENCOUNTER — Other Ambulatory Visit: Payer: Self-pay

## 2018-08-27 ENCOUNTER — Inpatient Hospital Stay (HOSPITAL_COMMUNITY): Payer: 59

## 2018-08-27 DIAGNOSIS — Z170 Estrogen receptor positive status [ER+]: Principal | ICD-10-CM

## 2018-08-27 DIAGNOSIS — C50112 Malignant neoplasm of central portion of left female breast: Secondary | ICD-10-CM

## 2018-08-27 LAB — COMPREHENSIVE METABOLIC PANEL
ALT: 33 U/L (ref 0–44)
AST: 190 U/L — ABNORMAL HIGH (ref 15–41)
Albumin: 3.9 g/dL (ref 3.5–5.0)
Alkaline Phosphatase: 66 U/L (ref 38–126)
Anion gap: 9 (ref 5–15)
BUN: 24 mg/dL — ABNORMAL HIGH (ref 6–20)
CO2: 25 mmol/L (ref 22–32)
Calcium: 9.9 mg/dL (ref 8.9–10.3)
Chloride: 105 mmol/L (ref 98–111)
Creatinine, Ser: 1.42 mg/dL — ABNORMAL HIGH (ref 0.44–1.00)
GFR calc non Af Amer: 43 mL/min — ABNORMAL LOW (ref >60)
GFR, EST AFRICAN AMERICAN: 50 mL/min — AB (ref >60)
Glucose, Bld: 119 mg/dL — ABNORMAL HIGH (ref 70–99)
POTASSIUM: 4 mmol/L (ref 3.5–5.1)
Sodium: 139 mmol/L (ref 135–145)
Total Bilirubin: 0.4 mg/dL (ref 0.3–1.2)
Total Protein: 8.4 g/dL — ABNORMAL HIGH (ref 6.5–8.1)

## 2018-08-27 LAB — CBC WITH DIFFERENTIAL/PLATELET
Abs Immature Granulocytes: 0.02 10*3/uL (ref 0.00–0.07)
Basophils Absolute: 0.1 10*3/uL (ref 0.0–0.1)
Basophils Relative: 2 %
Eosinophils Absolute: 0.2 10*3/uL (ref 0.0–0.5)
Eosinophils Relative: 4 %
HCT: 35.4 % — ABNORMAL LOW (ref 36.0–46.0)
Hemoglobin: 11.7 g/dL — ABNORMAL LOW (ref 12.0–15.0)
Immature Granulocytes: 0 %
LYMPHS PCT: 21 %
Lymphs Abs: 1.2 10*3/uL (ref 0.7–4.0)
MCH: 30 pg (ref 26.0–34.0)
MCHC: 33.1 g/dL (ref 30.0–36.0)
MCV: 90.8 fL (ref 80.0–100.0)
Monocytes Absolute: 0.4 10*3/uL (ref 0.1–1.0)
Monocytes Relative: 7 %
Neutro Abs: 3.7 10*3/uL (ref 1.7–7.7)
Neutrophils Relative %: 66 %
Platelets: 168 10*3/uL (ref 150–400)
RBC: 3.9 MIL/uL (ref 3.87–5.11)
RDW: 14.6 % (ref 11.5–15.5)
WBC: 5.6 10*3/uL (ref 4.0–10.5)
nRBC: 0 % (ref 0.0–0.2)

## 2018-08-27 MED FILL — VERZENIO 150 MG TAB: 150 | 28 days supply | Qty: 56 | Fill #0

## 2018-08-27 NOTE — Telephone Encounter (Signed)
Returned call to pt to advise of instructions per Dr. [name] - he advises her to take 1/2 tablet of tramadol for pain control and to try to minimize drowsiness.  However, he would like her to take her oxycodone for pain as prescribed as much as possible to help get her pain under control.  Also, advised that MD will be placing an order for MRI of her thoracic spine, and that one of our schedulers will be in touch with her when that appt is made to notify her of date and time.  Pt verbalizes understanding of the above - states she will take 1/2 tramadol during the day, and will use her oxycodone in the afternoon after she gets home from work to help with pain control. Instructed to call the clinic should she have any further questions/concerns.

## 2018-08-28 ENCOUNTER — Ambulatory Visit (HOSPITAL_COMMUNITY): Payer: 59

## 2018-08-28 ENCOUNTER — Ambulatory Visit (HOSPITAL_COMMUNITY): Payer: 59 | Admitting: Nurse Practitioner

## 2018-08-28 ENCOUNTER — Inpatient Hospital Stay (HOSPITAL_BASED_OUTPATIENT_CLINIC_OR_DEPARTMENT_OTHER): Payer: 59 | Admitting: Nurse Practitioner

## 2018-08-28 ENCOUNTER — Inpatient Hospital Stay (HOSPITAL_COMMUNITY): Payer: 59

## 2018-08-28 ENCOUNTER — Other Ambulatory Visit (HOSPITAL_COMMUNITY): Payer: 59

## 2018-08-28 ENCOUNTER — Encounter (HOSPITAL_COMMUNITY): Payer: Self-pay

## 2018-08-28 VITALS — BP 107/71 | HR 130 | Temp 99.3°F | Resp 18

## 2018-08-28 DIAGNOSIS — Z79899 Other long term (current) drug therapy: Secondary | ICD-10-CM

## 2018-08-28 DIAGNOSIS — C50112 Malignant neoplasm of central portion of left female breast: Secondary | ICD-10-CM | POA: Diagnosis not present

## 2018-08-28 DIAGNOSIS — Z170 Estrogen receptor positive status [ER+]: Secondary | ICD-10-CM

## 2018-08-28 DIAGNOSIS — C787 Secondary malignant neoplasm of liver and intrahepatic bile duct: Secondary | ICD-10-CM | POA: Diagnosis not present

## 2018-08-28 DIAGNOSIS — R944 Abnormal results of kidney function studies: Secondary | ICD-10-CM

## 2018-08-28 DIAGNOSIS — R740 Nonspecific elevation of levels of transaminase and lactic acid dehydrogenase [LDH]: Secondary | ICD-10-CM

## 2018-08-28 DIAGNOSIS — Z801 Family history of malignant neoplasm of trachea, bronchus and lung: Secondary | ICD-10-CM

## 2018-08-28 DIAGNOSIS — D649 Anemia, unspecified: Secondary | ICD-10-CM

## 2018-08-28 DIAGNOSIS — Z803 Family history of malignant neoplasm of breast: Secondary | ICD-10-CM

## 2018-08-28 DIAGNOSIS — M549 Dorsalgia, unspecified: Secondary | ICD-10-CM

## 2018-08-28 DIAGNOSIS — J45909 Unspecified asthma, uncomplicated: Secondary | ICD-10-CM

## 2018-08-28 DIAGNOSIS — K219 Gastro-esophageal reflux disease without esophagitis: Secondary | ICD-10-CM

## 2018-08-28 DIAGNOSIS — Z9012 Acquired absence of left breast and nipple: Secondary | ICD-10-CM

## 2018-08-28 DIAGNOSIS — F329 Major depressive disorder, single episode, unspecified: Secondary | ICD-10-CM

## 2018-08-28 LAB — CANCER ANTIGEN 27.29: CA 27.29: 2634.7 U/mL — ABNORMAL HIGH (ref 0.0–38.6)

## 2018-08-28 LAB — CANCER ANTIGEN 15-3: CA 15-3: 2219 U/mL — ABNORMAL HIGH (ref 0.0–25.0)

## 2018-08-28 MED ORDER — FULVESTRANT 250 MG/5ML IM SOLN
500.0000 mg | Freq: Once | INTRAMUSCULAR | Status: AC
  Administered 2018-08-28: 500 mg via INTRAMUSCULAR

## 2018-08-28 MED ORDER — SODIUM CHLORIDE 0.9 % IV SOLN
INTRAVENOUS | Status: AC
  Administered 2018-08-28: 15:00:00 via INTRAVENOUS

## 2018-08-28 NOTE — Patient Instructions (Signed)
[facility] Discharge Instructions     Thank you for choosing [facility] at [facility] to provide your oncology and hematology care.  To afford each patient quality time with our provider, please arrive at least 15 minutes before your scheduled appointment time.   If you have a lab appointment with the Cancer Center please come in thru the  Main Entrance and check in at the main information desk  You need to re-schedule your appointment should you arrive 10 or more minutes late.  We strive to give you quality time with our providers, and arriving late affects you and other patients whose appointments are after yours.  Also, if you no show three or more times for appointments you may be dismissed from the clinic at the providers discretion.     Again, thank you for choosing [facility].  Our hope is that these requests will decrease the amount of time that you wait before being seen by our physicians.       _____________________________________________________________  Should you have questions after your visit to [facility], please contact our office at [phone] between the hours of 8:00 a.m. and 4:30 p.m.  Voicemails left after 4:00 p.m. will not be returned until the following business day.  For prescription refill requests, have your pharmacy contact our office and allow 72 hours.    Cancer Center Support Programs:   > Cancer Support Group  2nd Tuesday of the month 1pm-2pm, Journey Room    

## 2018-08-28 NOTE — Progress Notes (Signed)
Pamela Long tolerated Faslodex injection and hydration well without complaints or incident. VSS upon discharge. Pt discharged self ambulatory in satisfactory condition

## 2018-08-28 NOTE — Assessment & Plan Note (Signed)
1.  Metastatic breast cancer to the liver: - She was diagnosed with left breast cancer June 29, 2015 in [place] biopsy showed infiltrative ductal carcinoma, ER/PR positive and Her-2 negative. - She underwent neoadjuvant chemotherapy with dose dense AC followed by Taxol from 07/14/2015 through 11/29/2015. - She had a left lumpectomy plus sentinel lymph node biopsy consistent with infiltrating lobular carcinoma, pleomorphic features, ER/PR positive, HER2 negative, 0 out of 3 lymph nodes positive. - In August 2017 she had a prophylactic right breast mastectomy plus completion of left breast simple mastectomy radiation not indicated. -She moved to [place] from [place] in March 2018. - In August 2018 she was switched to tamoxifen 20 mg daily.  In November 2019 she was switched to anastrozole and took only for a few weeks. -She started having epigastric and right upper quadrant pains in mid December 2019 - CT CAP on 06/17/2018 showed a very large hepatic mass measuring 22 cm, mildly enlarged left internal mamillary node at 0.8 cm, borderline enlarged left lower neck lymph node level 4 at 0.9 cm. -Biopsy of the liver mass on 06/26/2018 showed breast cancer, ER/PR positive and HER-2 negative. -MRI of the brain in January 2020 was also negative for metastatic disease. -Faslodex started on 07/02/2018 and Verzenio 150 mg twice daily started on 07/07/2018. -PET CT scan on 07/13/2018 showed very large centrally necrotic mass involving the right and left hepatic lobes, one other hypermetabolic lesion in the left lobe.  Multiple hypermetabolic lymph nodes in the upper abdomen, left supraclavicular space.  No thoracic adenopathy, chest wall mass or pulmonary or osseous lesions. -She is continuing to have pains on her right side lower chest wall from her back to her abdomen.  We think these are likely due from her liver lesion. -She is taking ibuprofen 800 mg in the morning. - She is having bruising on her lower  extremities and upper extremities.  She feels every time she gets slightly bumped she bruises. - She feels she is dehydrated and not taking in enough fluids her heart rate is 130. - We will give her a liter of normal saline today. - She is scheduled for an MRI of her back on Monday for her back pain and we will see her back on Tuesday for the results.  She has plenty of pain medications to get her through the weekend. -She will get her Faslodex injection today.  2.cytopenias: - She has had mild thrombocytopenia and anemia since the start of her Verzenio.  -Labs today on 08/28/2019 were platelets 168 which is increased from last visit.  Hemoglobin is 11.7.  Her AST is steady increase and today it is 190.  3.intermittent rectal bleeding: - Colonoscopy on 08/06/2018 showed normal ileum redundant colon, and internal and external hemorrhoids. -She reports she has not had any more bleeding and she only notices it when she is constipated.  4.elevated creatinine: - Her creatinine today on 08/28/2018 is 1.42.  This is likely due to the Verzenio.  We will closely monitor

## 2018-08-28 NOTE — Patient Instructions (Signed)
[place] at [facility] Discharge Instructions  Received Faslodex injection and hydration today. Follow-up as scheduled. Call clinic for any questions or concerns   Thank you for choosing [place] at [facility] to provide your oncology and hematology care.  To afford each patient quality time with our provider, please arrive at least 15 minutes before your scheduled appointment time.   If you have a lab appointment with the [facility] please come in thru the  Main Entrance and check in at the main information desk  You need to re-schedule your appointment should you arrive 10 or more minutes late.  We strive to give you quality time with our providers, and arriving late affects you and other patients whose appointments are after yours.  Also, if you no show three or more times for appointments you may be dismissed from the clinic at the providers discretion.     Again, thank you for choosing [facility].  Our hope is that these requests will decrease the amount of time that you wait before being seen by our physicians.       _____________________________________________________________  Should you have questions after your visit to [facility], please contact our office at [phone] between the hours of 8:00 a.m. and 4:30 p.m.  Voicemails left after 4:00 p.m. will not be returned until the following business day.  For prescription refill requests, have your pharmacy contact our office and allow 72 hours.    Cancer Center Support Programs:   > Cancer Support Group  2nd Tuesday of the month 1pm-2pm, Journey Room

## 2018-08-28 NOTE — Progress Notes (Signed)
[address], [place] [address]   CLINIC:  Medical Oncology/Hematology  PCP:  Doree Albee, MD [place] [address] [phone]   REASON FOR VISIT: Follow-up for metastatic breast cancer  CURRENT THERAPY: Faslodex and Verzenio   INTERVAL HISTORY:  Pamela Long 48 y.o. female returns for routine follow-up for metastatic breast cancer to the liver.  She is here today alone.  She is still having back pain mostly on the right side into her chest.  She reports she cannot take a deep breath without sharp stabbing pains.  She is short of breath on exertion.  She denies any coughing.  She does have generalized bruising on her legs and arms.  She feels if she bumps into anything she gets a bruise.  She is having trouble sleeping at night even with taking her pain medication.  The last few days she has been very lightheaded and her heart rate has been increased.  She reports she has not been drinking and feels dehydrated.  She has not had much of an appetite and has lost 5 pounds since her last visit. Denies any nausea, vomiting, or diarrhea.  Had not noticed any recent bleeding such as epistaxis, hematuria or hematochezia. Denies recent chest pain on exertion, pre-syncopal episodes, or palpitations. Denies any numbness or tingling in hands or feet. Denies any recent fevers, infections, or recent hospitalizations. Patient reports appetite at 25% and energy level at 0%.     REVIEW OF SYSTEMS:  Review of Systems  Constitutional: Positive for fatigue.  Respiratory: Positive for shortness of breath.   Musculoskeletal: Positive for back pain.  Neurological: Positive for dizziness.  Hematological: Bruises/bleeds easily.  All other systems reviewed and are negative.    PAST MEDICAL/SURGICAL HISTORY:  Past Medical History:  Diagnosis Date  . Anemia   . Anxiety   . Asthma   . Breast cancer ([place])    Left, 2017  . Depression   . GERD  (gastroesophageal reflux disease)   . Migraine   . OAB (overactive bladder)   . Seasonal allergies    Past Surgical History:  Procedure Laterality Date  . ABDOMINAL HYSTERECTOMY     breast cancer  . ANKLE RECONSTRUCTION Right 1989  . BREAST SURGERY    . COLONOSCOPY WITH PROPOFOL N/A 08/06/2018   Procedure: COLONOSCOPY WITH PROPOFOL;  Surgeon: Danie Binder, MD;  Location: [facility];  Service: Endoscopy;  Laterality: N/A;  1:30pm  . FOREIGN BODY REMOVAL N/A 08/29/2017   from lip  . KNEE SURGERY Right 1989   bone spur  . MASTECTOMY  2017   bil mastectomies  . SINUSOTOMY       SOCIAL HISTORY:  Social History   Socioeconomic History  . Marital status: Single    Spouse name: Not on file  . Number of children: 1  . Years of education: 44  . Highest education level: Not on file  Occupational History  . Occupation: disabled  Social Needs  . Financial resource strain: Somewhat hard  . Food insecurity:    Worry: Sometimes true    Inability: Sometimes true  . Transportation needs:    Medical: No    Non-medical: No  Tobacco Use  . Smoking status: Never Smoker  . Smokeless tobacco: Never Used  Substance and Sexual Activity  . Alcohol use: No  . Drug use: No  . Sexual activity: Not Currently  Lifestyle  . Physical activity:  Days per week: 0 days    Minutes per session: 0 min  . Stress: Rather much  Relationships  . Social connections:    Talks on phone: Once a week    Gets together: Once a week    Attends religious service: 1 to 4 times per year    Active member of club or organization: Yes    Attends meetings of clubs or organizations: Never    Relationship status: Never married  . Intimate partner violence:    Fear of current or ex partner: No    Emotionally abused: No    Physically abused: No    Forced sexual activity: No  Other Topics Concern  . Not on file  Social History Narrative   Bachelors degree   Accounting   Lives with daughter Pamela Long who  has autism and diabetes   Likes to sew, quilt, crafts, crochet    FAMILY HISTORY:  Family History  Problem Relation Age of Onset  . Arthritis Mother   . COPD Mother   . Depression Mother   . Diabetes Mother   . Kidney disease Father   . Heart disease Father 76  . Drug abuse Father   . Hypertension Father   . Diabetes Daughter   . Hashimoto's thyroiditis Daughter   . Irritable bowel syndrome Daughter   . Autism spectrum disorder Daughter   . Early death Maternal Grandmother        drowned  . Early death Maternal Grandfather   . Heart disease Maternal Grandfather   . Heart disease Paternal Grandfather   . Breast cancer Maternal Aunt   . Breast cancer Cousin   . Lung cancer Maternal Aunt   . Lung cancer Maternal Uncle   . Colon cancer Neg Hx     CURRENT MEDICATIONS:  Outpatient Encounter Medications as of 08/28/2018  Medication Sig Note  . abemaciclib (VERZENIO) 150 MG tablet Take 1 tablet (150 mg total) by mouth 2 (two) times daily. Swallow tablets whole. Do not chew, crush, or split tablets before swallowing.   . Cholecalciferol (VITAMIN D3 PO) Take 5 each by mouth daily.  07/21/2018: Chewable  . donepezil (ARICEPT) 5 MG tablet Take 5 mg by mouth at bedtime.   Marland Kitchen escitalopram (LEXAPRO) 20 MG tablet Take 1 tablet (20 mg total) by mouth daily.   Marland Kitchen escitalopram (LEXAPRO) 20 MG tablet    . fulvestrant (FASLODEX) 250 MG/5ML injection Inject 250 mg into the muscle every 14 (fourteen) days. One injection each buttock over 1-2 minutes. Warm prior to use.   . gabapentin (NEURONTIN) 300 MG capsule Take 300 mg by mouth at bedtime.    Marland Kitchen loperamide (IMODIUM) 2 MG capsule Take 2-4 mg by mouth daily as needed for diarrhea or loose stools.   . naproxen sodium (ALEVE) 220 MG tablet Take 220-440 mg by mouth 2 (two) times daily as needed (for pain or headache).    . ondansetron (ZOFRAN) 4 MG tablet Take 1 tablet (4 mg total) by mouth every 8 (eight) hours as needed for nausea or vomiting.   Marland Kitchen  oxyCODONE (OXY IR/ROXICODONE) 5 MG immediate release tablet Take 1 tablet (5 mg total) by mouth every 8 (eight) hours as needed for severe pain.   . pantoprazole (PROTONIX) 40 MG tablet TAKE 1 TABLET BY MOUTH DAILY BEFORE BREAKFAST (Patient taking differently: Take 40 mg by mouth daily before breakfast. )   . Polyvinyl Alcohol-Povidone PF (REFRESH) 1.4-0.6 % SOLN Place 1 drop into both eyes daily as  needed (for dry eyes).   . rizatriptan (MAXALT-MLT) 10 MG disintegrating tablet Take 10 mg by mouth every 2 (two) hours as needed for migraine.    . traMADol (ULTRAM) 50 MG tablet Take 1 tablet (50 mg total) by mouth 2 (two) times daily.   Marland Kitchen TROKENDI XR 50 MG CP24 Take 50 mg by mouth 2 (two) times daily.   Marland Kitchen TROKENDI XR 50 MG CP24    . [EXPIRED] 0.9 %  sodium chloride infusion    . [EXPIRED] fulvestrant (FASLODEX) injection 500 mg     No facility-administered encounter medications on file as of 08/28/2018.     ALLERGIES:  Allergies  Allergen Reactions  . Corn-Containing Products Other (See Comments)    Headache and GI upset  . Tape Itching and Other (See Comments)    Depending on the adhesive-blistering occurs  . Amoxicillin Hives and Other (See Comments)    Rash only DID THE REACTION INVOLVE: Swelling of the face/tongue/throat, SOB, or low BP? Sudden or severe rash/hives, skin peeling, or the inside of the mouth or nose?  Did it require medical treatment?  When did it last happen? If all above answers are "NO", may proceed with cephalosporin use.   . Caffeine Diarrhea, Nausea Only, Palpitations and Other (See Comments)    Headache  . Tetanus Toxoids Swelling and Other (See Comments)    Local reaction     PHYSICAL EXAM:  ECOG Performance status: 1  VITAL SIGNS:BP: 107/71, P:130, R:16, T:99.3, O2:96% WEIGHT: 195  Physical Exam Constitutional:      Appearance: Normal appearance. She is normal weight.  Cardiovascular:     Rate and Rhythm: Tachycardia present.     Heart  sounds: Normal heart sounds.  Pulmonary:     Effort: Pulmonary effort is normal.     Breath sounds: Normal breath sounds.  Musculoskeletal: Normal range of motion.  Skin:    General: Skin is warm and dry.  Neurological:     General: No focal deficit present.     Mental Status: She is alert and oriented to person, place, and time. Mental status is at baseline.  Psychiatric:        Mood and Affect: Mood normal.        Behavior: Behavior normal.        Thought Content: Thought content normal.        Judgment: Judgment normal.      LABORATORY DATA:  I have reviewed the labs as listed.  CBC    Component Value Date/Time   WBC 5.6 08/27/2018 1414   RBC 3.90 08/27/2018 1414   HGB 11.7 (L) 08/27/2018 1414   HCT 35.4 (L) 08/27/2018 1414   PLT 168 08/27/2018 1414   MCV 90.8 08/27/2018 1414   MCH 30.0 08/27/2018 1414   MCHC 33.1 08/27/2018 1414   RDW 14.6 08/27/2018 1414   LYMPHSABS 1.2 08/27/2018 1414   MONOABS 0.4 08/27/2018 1414   EOSABS 0.2 08/27/2018 1414   BASOSABS 0.1 08/27/2018 1414   CMP Latest Ref Rng & Units 08/27/2018 08/12/2018 07/29/2018  Glucose 70 - 99 mg/dL 119(H) 85 95  BUN 6 - 20 mg/dL 24(H) 22(H) 18  Creatinine 0.44 - 1.00 mg/dL 1.42(H) 1.61(H) 1.40(H)  Sodium 135 - 145 mmol/L 139 139 141  Potassium 3.5 - 5.1 mmol/L 4.0 4.2 3.8  Chloride 98 - 111 mmol/L 105 106 106  CO2 22 - 32 mmol/L '25 24 25  ' Calcium 8.9 - 10.3 mg/dL 9.9 9.4 9.5  Total Protein  6.5 - 8.1 g/dL 8.4(H) 8.1 8.0  Total Bilirubin 0.3 - 1.2 mg/dL 0.4 0.4 0.5  Alkaline Phos 38 - 126 U/L 66 65 53  AST 15 - 41 U/L 190(H) 148(H) 134(H)  ALT 0 - 44 U/L 33 31 32       DIAGNOSTIC IMAGING:  I have independently reviewed the scans and discussed with the patient.   I have reviewed Francene Finders, NP's note and agree with the documentation.  I personally performed a face-to-face visit, made revisions and my assessment and plan is as follows.    ASSESSMENT & PLAN:   Malignant neoplasm of central  portion of left breast in female, estrogen receptor positive ([place]) 1.  Metastatic breast cancer to the liver: - She was diagnosed with left breast cancer June 29, 2015 in [place] biopsy showed infiltrative ductal carcinoma, ER/PR positive and Her-2 negative. - She underwent neoadjuvant chemotherapy with dose dense AC followed by Taxol from 07/14/2015 through 11/29/2015. - She had a left lumpectomy plus sentinel lymph node biopsy consistent with infiltrating lobular carcinoma, pleomorphic features, ER/PR positive, HER2 negative, 0 out of 3 lymph nodes positive. - In August 2017 she had a prophylactic right breast mastectomy plus completion of left breast simple mastectomy radiation not indicated. -She moved to [place] from [place] in March 2018. - In August 2018 she was switched to tamoxifen 20 mg daily.  In November 2019 she was switched to anastrozole and took only for a few weeks. -She started having epigastric and right upper quadrant pains in mid December 2019 - CT CAP on 06/17/2018 showed a very large hepatic mass measuring 22 cm, mildly enlarged left internal mamillary node at 0.8 cm, borderline enlarged left lower neck lymph node level 4 at 0.9 cm. -Biopsy of the liver mass on 06/26/2018 showed breast cancer, ER/PR positive and HER-2 negative. -MRI of the brain in January 2020 was also negative for metastatic disease. -Faslodex started on 07/02/2018 and Verzenio 150 mg twice daily started on 07/07/2018. -PET CT scan on 07/13/2018 showed very large centrally necrotic mass involving the right and left hepatic lobes, one other hypermetabolic lesion in the left lobe.  Multiple hypermetabolic lymph nodes in the upper abdomen, left supraclavicular space.  No thoracic adenopathy, chest wall mass or pulmonary or osseous lesions. -She is continuing to have pains on her right side lower chest wall from her back to her abdomen.  We think these are likely due from her liver lesion. -She is taking ibuprofen  800 mg in the morning. - She is having bruising on her lower extremities and upper extremities.  She feels every time she gets slightly bumped she bruises. - She feels she is dehydrated and not taking in enough fluids her heart rate is 130. - We will give her a liter of normal saline today. - She is scheduled for an MRI of her back on Monday for her back pain and we will see her back on Tuesday for the results.  She has plenty of pain medications to get her through the weekend. -She will get her Faslodex injection today.  2.cytopenias: - She has had mild thrombocytopenia and anemia since the start of her Verzenio.  -Labs today on 08/28/2019 were platelets 168 which is increased from last visit.  Hemoglobin is 11.7.  Her AST is steady increase and today it is 190.  3.intermittent rectal bleeding: - Colonoscopy on 08/06/2018 showed normal ileum redundant colon, and internal and external hemorrhoids. -She reports she has not had  any more bleeding and she only notices it when she is constipated.  4.elevated creatinine: - Her creatinine today on 08/28/2018 is 1.42.  This is likely due to the Verzenio.  We will closely monitor      Orders placed this encounter:  Orders Placed This Encounter  Procedures  . APTT  . Protime-INR  . Fibrinogen  . Hepatic function panel      Derek Jack, MD [place] [phone]

## 2018-08-31 ENCOUNTER — Other Ambulatory Visit: Payer: Self-pay

## 2018-08-31 ENCOUNTER — Ambulatory Visit (HOSPITAL_COMMUNITY)
Admission: RE | Admit: 2018-08-31 | Discharge: 2018-08-31 | Disposition: A | Discharge status: Home / Self Care | Payer: 59 | Source: Ambulatory Visit | Attending: Hematology | Admitting: Hematology

## 2018-08-31 DIAGNOSIS — C50112 Malignant neoplasm of central portion of left female breast: Secondary | ICD-10-CM | POA: Insufficient documentation

## 2018-08-31 DIAGNOSIS — C50919 Malignant neoplasm of unspecified site of unspecified female breast: Secondary | ICD-10-CM | POA: Insufficient documentation

## 2018-08-31 DIAGNOSIS — Z170 Estrogen receptor positive status [ER+]: Secondary | ICD-10-CM | POA: Insufficient documentation

## 2018-08-31 IMAGING — MR MRI THORACIC SPINE WITHOUT AND WITH CONTRAST
4 of 9 series · 14 of 48 positions shown · IV contrast (10ml Gadavist)
Comparison: Chest CTA [DATE].  PET-CT [DATE].

CLINICAL DATA: 47-year-old female with metastatic breast cancer.
Three weeks of low back pain.

EXAM:
MRI THORACIC WITHOUT AND WITH CONTRAST
TECHNIQUE: Multiplanar and multiecho pulse sequences of the thoracic spine were
obtained without and with intravenous contrast.
CONTRAST:  10 milliliters Gadavist

[Series 7: T1 · sagittal · 4.0mm · 0.75mm/px · 3 of 13 slices shown]
[im 1/13]
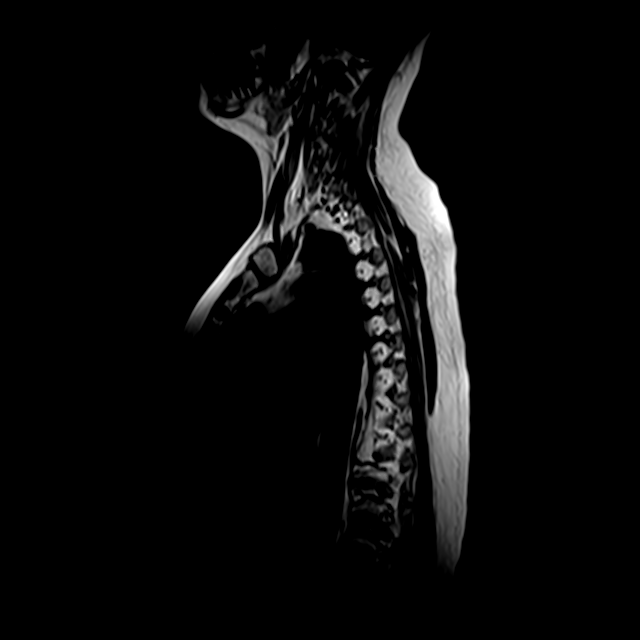
[im 9/13]
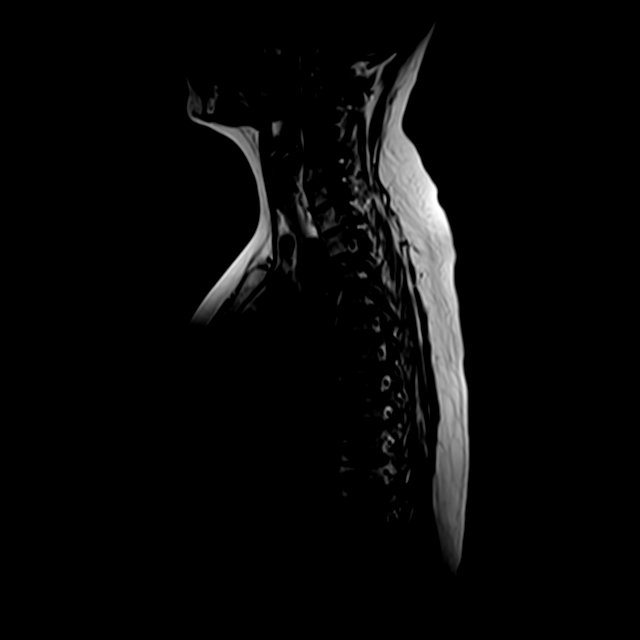
[im 13/13]
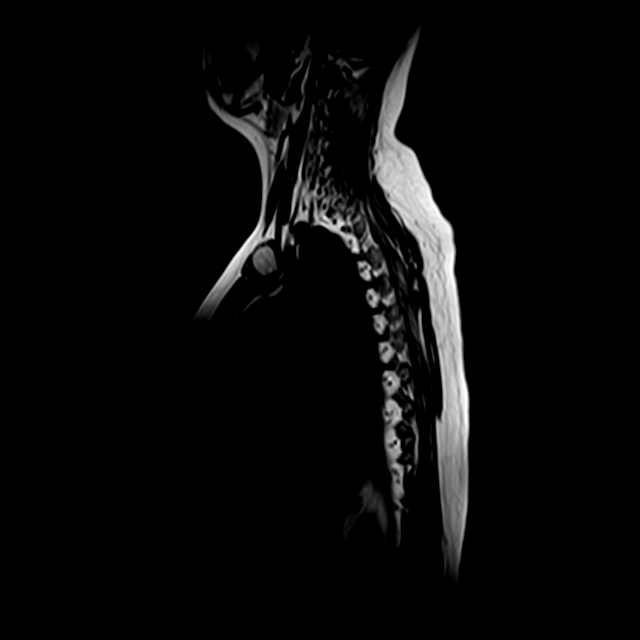

[Series 9: T2 · sagittal · 4.0mm · 0.59mm/px · 3 of 16 slices shown (1 of 3)]
[im 1/16]
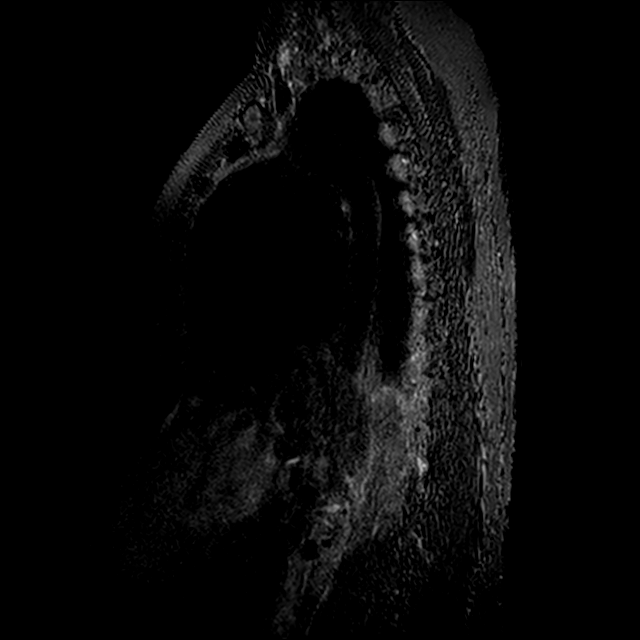
[im 8/16]
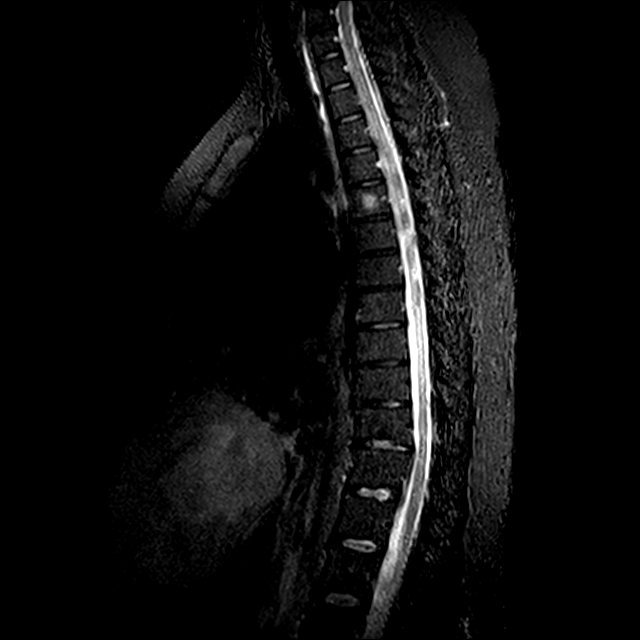
[im 16/16]
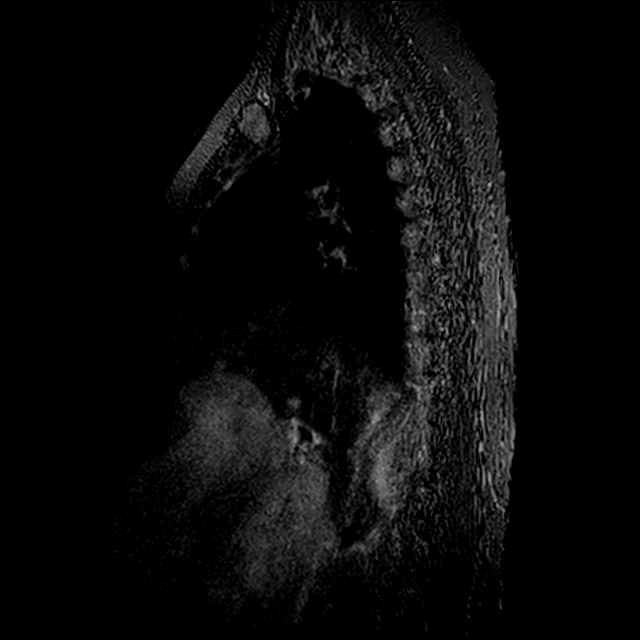

[Series 11: T2 · axial · 3.0mm · 0.28mm/px · z∈[-384,-180]mm · 5 of 41 slices shown (2 of 3)]
[im 1/41]
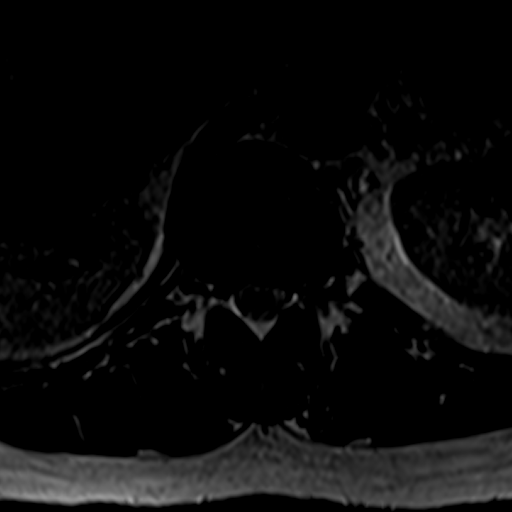
[im 6/41]
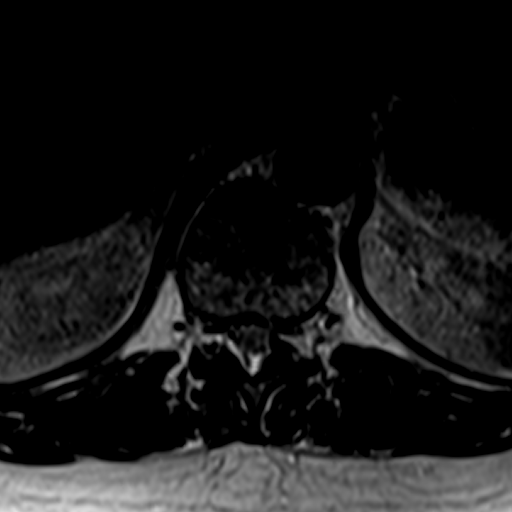
[im 12/41]
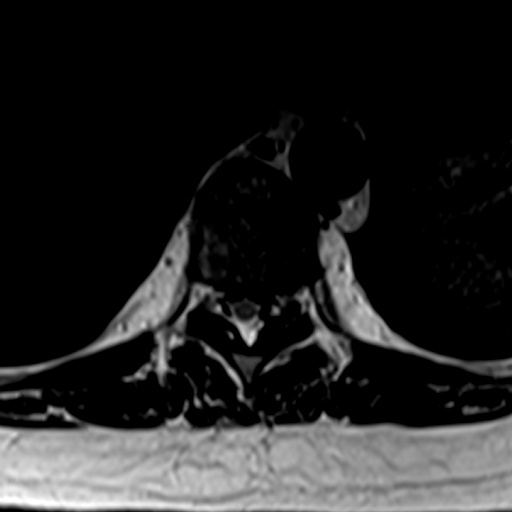
[im 23/41]
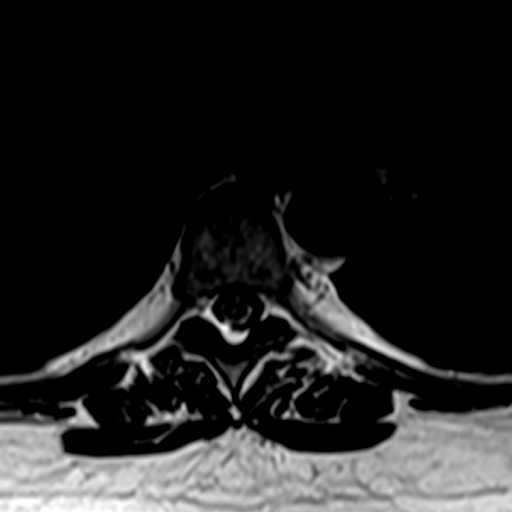
[im 35/41]
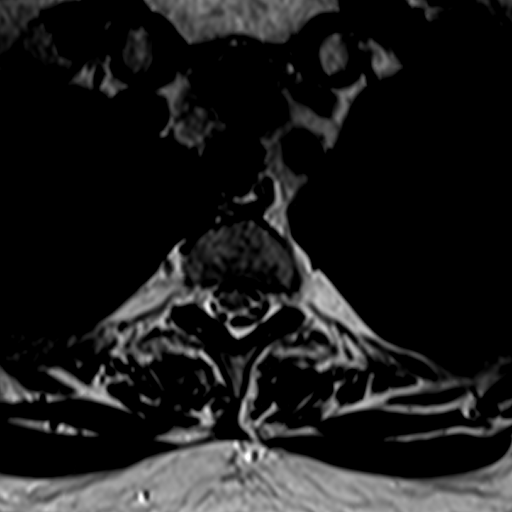

[Series 13: T2 · sagittal · 4.0mm · 0.59mm/px · 3 of 13 slices shown (3 of 3)]
[im 1/13]
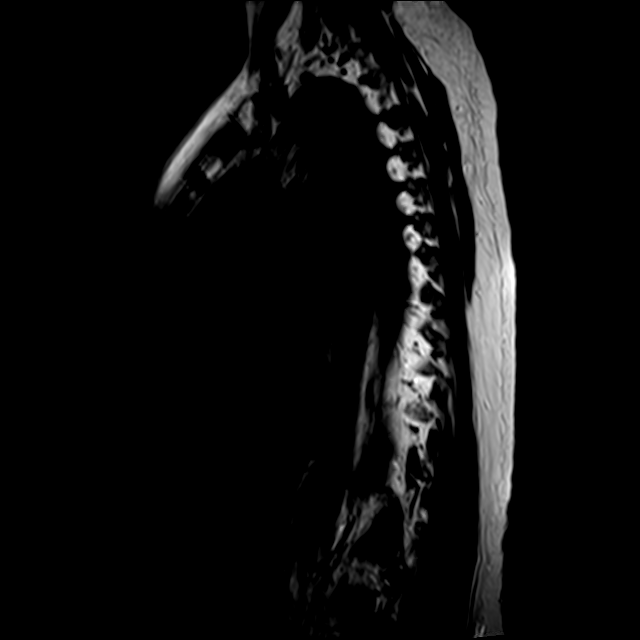
[im 7/13]
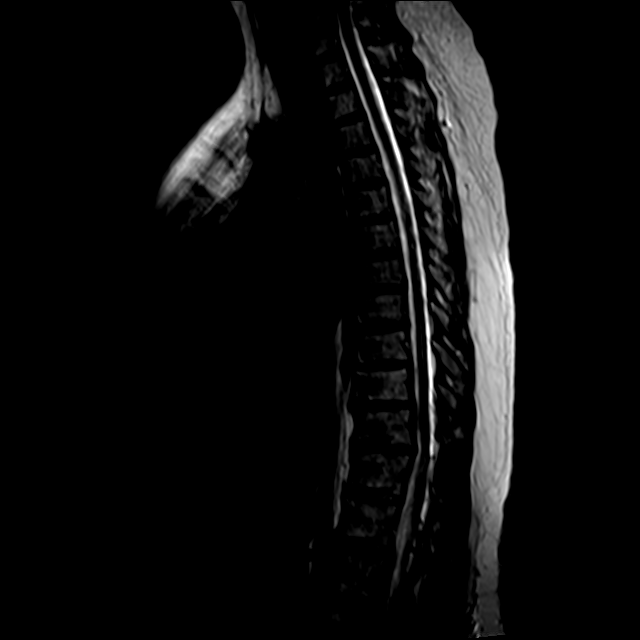
[im 13/13]
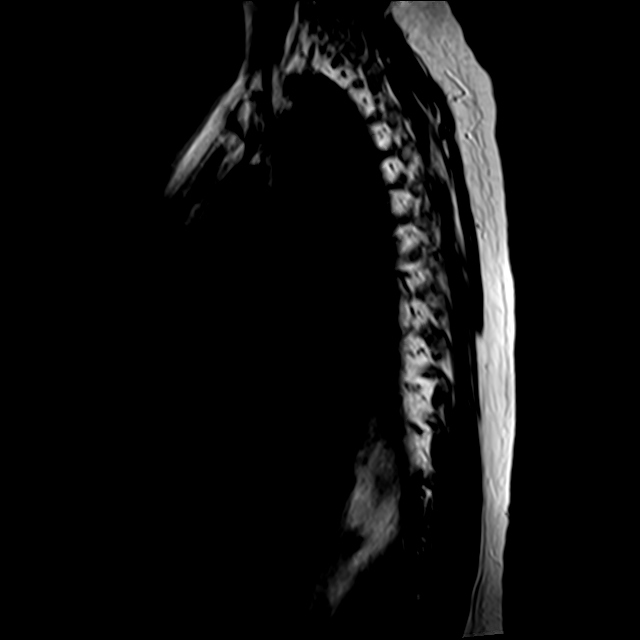

[14 of 48 positions shown; findings below may reference images not displayed]

FINDINGS: Limited cervical spine imaging:  Negative.

Thoracic spine segmentation:  Appears to be normal.

Alignment:  Within normal limits.

Vertebrae: Small round 6-7 millimeter T1 hypointense and enhancing
lesion in the central T5 vertebral body most resembles a metastasis
(series 8, image 7 and series 14, image 8.

There is vague marrow heterogeneity with increased STIR signal and
enhancement in the anterior T8 vertebral body (series 14, image 7
and series 9, image 7.

Occasional benign hemangiomas (left T11 body on series 8, image 9.

No other suspicious thoracic vertebral lesion. Degenerative endplate
marrow signal changes inferiorly at T11.

Negative visible upper lumbar vertebrae.

Cord: Spinal cord signal is within normal limits at all visualized
levels. No abnormal intradural enhancement. No dural thickening
identified. Conus medullaris appears negative at L1.

Paraspinal and other soft tissues: Negative visible thoracic
viscera. Negative visible kidneys and adrenal glands. Thoracic
paraspinal soft tissues are within normal limits.

Disc levels:

Intermittent thoracic disc, endplate, and posterior element
degeneration.

There is borderline to mild degenerative thoracic spinal stenosis at
T11-T12.

No other thoracic spinal stenosis.

Facet hypertrophy with up to mild bilateral T10 neural foraminal
stenosis at T10-T11.
IMPRESSION: 1. Positive for small vertebral body metastases at T5 and T8. No
pathologic fracture or epidural extension.
2. Boderline to mild degenerative spinal stenosis at T11-T12.

## 2018-08-31 MED ORDER — GADOBUTROL 1 MMOL/ML IV SOLN
10.0000 mL | Freq: Once | INTRAVENOUS | Status: AC | PRN
  Administered 2018-08-31: 10 mL via INTRAVENOUS

## 2018-09-01 ENCOUNTER — Inpatient Hospital Stay (HOSPITAL_COMMUNITY): Payer: 59

## 2018-09-01 ENCOUNTER — Inpatient Hospital Stay (HOSPITAL_COMMUNITY): Payer: 59 | Attending: Hematology | Admitting: Hematology

## 2018-09-01 ENCOUNTER — Encounter (HOSPITAL_COMMUNITY): Payer: Self-pay

## 2018-09-01 ENCOUNTER — Encounter (HOSPITAL_COMMUNITY): Payer: Self-pay | Admitting: Hematology

## 2018-09-01 ENCOUNTER — Other Ambulatory Visit: Payer: Self-pay

## 2018-09-01 VITALS — BP 116/83 | HR 111 | Temp 99.1°F | Resp 21 | Wt 196.2 lb

## 2018-09-01 DIAGNOSIS — C50919 Malignant neoplasm of unspecified site of unspecified female breast: Secondary | ICD-10-CM

## 2018-09-01 DIAGNOSIS — R740 Nonspecific elevation of levels of transaminase and lactic acid dehydrogenase [LDH]: Secondary | ICD-10-CM | POA: Diagnosis not present

## 2018-09-01 DIAGNOSIS — Z170 Estrogen receptor positive status [ER+]: Principal | ICD-10-CM

## 2018-09-01 DIAGNOSIS — C787 Secondary malignant neoplasm of liver and intrahepatic bile duct: Secondary | ICD-10-CM | POA: Insufficient documentation

## 2018-09-01 DIAGNOSIS — K219 Gastro-esophageal reflux disease without esophagitis: Secondary | ICD-10-CM | POA: Insufficient documentation

## 2018-09-01 DIAGNOSIS — J45909 Unspecified asthma, uncomplicated: Secondary | ICD-10-CM | POA: Insufficient documentation

## 2018-09-01 DIAGNOSIS — C50112 Malignant neoplasm of central portion of left female breast: Secondary | ICD-10-CM | POA: Diagnosis not present

## 2018-09-01 DIAGNOSIS — R944 Abnormal results of kidney function studies: Secondary | ICD-10-CM

## 2018-09-01 DIAGNOSIS — Z79899 Other long term (current) drug therapy: Secondary | ICD-10-CM | POA: Insufficient documentation

## 2018-09-01 DIAGNOSIS — D649 Anemia, unspecified: Secondary | ICD-10-CM | POA: Insufficient documentation

## 2018-09-01 DIAGNOSIS — Z801 Family history of malignant neoplasm of trachea, bronchus and lung: Secondary | ICD-10-CM

## 2018-09-01 DIAGNOSIS — F329 Major depressive disorder, single episode, unspecified: Secondary | ICD-10-CM | POA: Insufficient documentation

## 2018-09-01 DIAGNOSIS — M549 Dorsalgia, unspecified: Secondary | ICD-10-CM

## 2018-09-01 DIAGNOSIS — Z9012 Acquired absence of left breast and nipple: Secondary | ICD-10-CM | POA: Insufficient documentation

## 2018-09-01 DIAGNOSIS — Z803 Family history of malignant neoplasm of breast: Secondary | ICD-10-CM | POA: Insufficient documentation

## 2018-09-01 LAB — APTT: aPTT: 33 seconds (ref 24–36)

## 2018-09-01 LAB — HEPATIC FUNCTION PANEL
ALT: 58 U/L — ABNORMAL HIGH (ref 0–44)
AST: 223 U/L — ABNORMAL HIGH (ref 15–41)
Albumin: 3.8 g/dL (ref 3.5–5.0)
Alkaline Phosphatase: 86 U/L (ref 38–126)
Bilirubin, Direct: 0.5 mg/dL — ABNORMAL HIGH (ref 0.0–0.2)
Indirect Bilirubin: 0.5 mg/dL (ref 0.3–0.9)
Total Bilirubin: 1 mg/dL (ref 0.3–1.2)
Total Protein: 8.2 g/dL — ABNORMAL HIGH (ref 6.5–8.1)

## 2018-09-01 LAB — PROTIME-INR
INR: 1.2 (ref 0.8–1.2)
PROTHROMBIN TIME: 14.8 s (ref 11.4–15.2)

## 2018-09-01 LAB — FIBRINOGEN: FIBRINOGEN: 405 mg/dL (ref 210–475)

## 2018-09-01 MED ORDER — LIDOCAINE 5 % EX PTCH: 1.0000 | MEDICATED_PATCH | CUTANEOUS | 1 refills | Status: DC

## 2018-09-01 NOTE — Progress Notes (Signed)
[place],  [id number]   CLINIC:  Medical Oncology/Hematology  PCP:  Doree Albee, MD [place] [address] [phone]   REASON FOR VISIT:  Follow-up for metastatic breast cancer  CURRENT THERAPY: Faslodex and Verzenio     INTERVAL HISTORY:  Pamela Long 48 y.o. female returns for routine follow-up. She is here today alone. She sate that she is still having the back as before. She states that her pain is in her upper mid back, and also on the right side.She states that she is taking tramodol, advil, and oxycodone at bed time. She states that she has noticed some shortness of breath with exertion.  Denies any nausea, vomiting, or diarrhea. Denies any new pains. Had not noticed any recent bleeding such as epistaxis, hematuria or hematochezia. Denies recent chest pain on exertion,  pre-syncopal episodes, or palpitations. Denies any numbness or tingling in hands or feet. Denies any recent fevers, infections, or recent hospitalizations. Patient reports appetite at 0% and energy level at 20%.    REVIEW OF SYSTEMS:  Review of Systems  Respiratory: Positive for shortness of breath.   Gastrointestinal: Positive for abdominal pain.     PAST MEDICAL/SURGICAL HISTORY:  Past Medical History:  Diagnosis Date   Anemia    Anxiety    Asthma    Breast cancer ([place])    Left, 2017   Depression    GERD (gastroesophageal reflux disease)    Migraine    OAB (overactive bladder)    Seasonal allergies    Past Surgical History:  Procedure Laterality Date   ABDOMINAL HYSTERECTOMY     breast cancer   ANKLE RECONSTRUCTION Right 1989   BREAST SURGERY     COLONOSCOPY WITH PROPOFOL N/A 08/06/2018   Procedure: COLONOSCOPY WITH PROPOFOL;  Surgeon: Danie Binder, MD;  Location: [facility];  Service: Endoscopy;  Laterality: N/A;  1:30pm   FOREIGN BODY REMOVAL N/A 08/29/2017   from lip   KNEE SURGERY Right 1989   bone spur   MASTECTOMY  2017   bil mastectomies   SINUSOTOMY       SOCIAL HISTORY:  Social History   Socioeconomic History   Marital status: Single    Spouse name: Not on file   Number of children: 1   Years of education: 16   Highest education level: Not on file  Occupational History   Occupation: disabled  Scientist, product/process development strain: Somewhat hard   Food insecurity:    Worry: Sometimes true    Inability: Sometimes true   Transportation needs:    Medical: No    Non-medical: No  Tobacco Use   Smoking status: Never Smoker   Smokeless tobacco: Never Used  Substance and Sexual Activity   Alcohol use: No   Drug use: No   Sexual activity: Not Currently  Lifestyle   Physical activity:    Days per week: 0 days    Minutes per session: 0 min   Stress: Rather much  Relationships   Social connections:    Talks on phone: Once a week    Gets together: Once a week    Attends religious service: 1 to 4 times per year    Active member of club or organization: Yes    Attends meetings of clubs or organizations: Never    Relationship status: Never married   Intimate partner violence:    Fear of current or ex partner:  No    Emotionally abused: No    Physically abused: No    Forced sexual activity: No  Other Topics Concern   Not on file  Social History Narrative   Bachelors degree   Accounting   Lives with daughter [name] who has autism and diabetes   Likes to sew, quilt, crafts, crochet    FAMILY HISTORY:  Family History  Problem Relation Age of Onset   Arthritis Mother    COPD Mother    Depression Mother    Diabetes Mother    Kidney disease Father    Heart disease Father 69   Drug abuse Father    Hypertension Father    Diabetes Daughter    Hashimoto's thyroiditis Daughter    Irritable bowel syndrome Daughter    Autism spectrum disorder Daughter    Early death Maternal Grandmother        drowned   Early death  Maternal Grandfather    Heart disease Maternal Grandfather    Heart disease Paternal Grandfather    Breast cancer Maternal Aunt    Breast cancer Cousin    Lung cancer Maternal Aunt    Lung cancer Maternal Uncle    Colon cancer Neg Hx     CURRENT MEDICATIONS:  Outpatient Encounter Medications as of 09/01/2018  Medication Sig Note   abemaciclib (VERZENIO) 150 MG tablet Take 1 tablet (150 mg total) by mouth 2 (two) times daily. Swallow tablets whole. Do not chew, crush, or split tablets before swallowing.    Carboxymeth-Glyc-Polysorb PF (REFRESH OPTIVE MEGA-3) 0.5-1-0.5 % SOLN Apply 1 drop to eye 2 (two) times daily.    Carboxymeth-Glyc-Polysorb PF (REFRESH OPTIVE MEGA-3) 0.5-1-0.5 % SOLN Apply to eye.    Cholecalciferol (VITAMIN D3 PO) Take 5 each by mouth daily.  07/21/2018: Chewable   escitalopram (LEXAPRO) 20 MG tablet Take 1 tablet (20 mg total) by mouth daily.    gabapentin (NEURONTIN) 300 MG capsule Take 600 mg by mouth at bedtime. Per patient 600 mg at bedtime    loperamide (IMODIUM) 2 MG capsule Take 2-4 mg by mouth daily as needed for diarrhea or loose stools.    naproxen sodium (ALEVE) 220 MG tablet Take 220-440 mg by mouth 2 (two) times daily as needed (for pain or headache).     ondansetron (ZOFRAN) 4 MG tablet Take 1 tablet (4 mg total) by mouth every 8 (eight) hours as needed for nausea or vomiting.    pantoprazole (PROTONIX) 40 MG tablet TAKE 1 TABLET BY MOUTH DAILY BEFORE BREAKFAST (Patient taking differently: Take 40 mg by mouth daily before breakfast. )    rizatriptan (MAXALT-MLT) 10 MG disintegrating tablet Take 10 mg by mouth every 2 (two) hours as needed for migraine.     traMADol (ULTRAM) 50 MG tablet Take 1 tablet (50 mg total) by mouth 2 (two) times daily.    TROKENDI XR 50 MG CP24 Take 50 mg by mouth 2 (two) times daily.    fulvestrant (FASLODEX) 250 MG/5ML injection Inject 250 mg into the muscle every 14 (fourteen) days. One injection each buttock  over 1-2 minutes. Warm prior to use.    lidocaine (LIDODERM) 5 % Place 1 patch onto the skin daily. Remove & Discard patch within 12 hours or as directed by MD    oxyCODONE (OXY IR/ROXICODONE) 5 MG immediate release tablet Take 1 tablet (5 mg total) by mouth every 8 (eight) hours as needed for severe pain. (Patient taking differently: Take 5 mg by mouth at bedtime. Per  pt she takes 5 mg at bedtime)    Polyvinyl Alcohol-Povidone PF (REFRESH) 1.4-0.6 % SOLN Place 1 drop into both eyes daily as needed (for dry eyes).    [DISCONTINUED] donepezil (ARICEPT) 5 MG tablet Take 5 mg by mouth at bedtime.    [DISCONTINUED] escitalopram (LEXAPRO) 20 MG tablet     [DISCONTINUED] TROKENDI XR 50 MG CP24     No facility-administered encounter medications on file as of 09/01/2018.     ALLERGIES:  Allergies  Allergen Reactions   Corn-Containing Products Other (See Comments)    Headache and GI upset   Tape Itching and Other (See Comments)    Depending on the adhesive-blistering occurs   Amoxicillin Hives and Other (See Comments)    Rash only DID THE REACTION INVOLVE: Swelling of the face/tongue/throat, SOB, or low BP? Sudden or severe rash/hives, skin peeling, or the inside of the mouth or nose?  Did it require medical treatment?  When did it last happen? If all above answers are "NO", may proceed with cephalosporin use.    Caffeine Diarrhea, Nausea Only, Palpitations and Other (See Comments)    Headache   Tetanus Toxoids Swelling and Other (See Comments)    Local reaction     PHYSICAL EXAM:  ECOG Performance status: 1  Vitals:   09/01/18 1243 09/01/18 1305  BP: 116/83 116/83  Pulse: (!) 111 (!) 111  Resp: (!) 21 (!) 21  Temp: 99.1 F (37.3 C) 99.1 F (37.3 C)  SpO2: 97%    Filed Weights   09/01/18 1243 09/01/18 1305  Weight: 196 lb 3.2 oz (89 kg) 196 lb 3.2 oz (89 kg)    Physical Exam Constitutional:      Appearance: Normal appearance.  Cardiovascular:     Rate and  Rhythm: Normal rate and regular rhythm.     Heart sounds: Normal heart sounds.  Pulmonary:     Effort: Pulmonary effort is normal.     Breath sounds: Normal breath sounds.  Abdominal:     Palpations: Abdomen is soft. There is no mass.  Musculoskeletal:        General: No swelling.  Skin:    General: Skin is warm.  Neurological:     General: No focal deficit present.     Mental Status: She is alert and oriented to person, place, and time.  Psychiatric:        Mood and Affect: Mood normal.        Behavior: Behavior normal.      LABORATORY DATA:  I have reviewed the labs as listed.  CBC    Component Value Date/Time   WBC 5.6 08/27/2018 1414   RBC 3.90 08/27/2018 1414   HGB 11.7 (L) 08/27/2018 1414   HCT 35.4 (L) 08/27/2018 1414   PLT 168 08/27/2018 1414   MCV 90.8 08/27/2018 1414   MCH 30.0 08/27/2018 1414   MCHC 33.1 08/27/2018 1414   RDW 14.6 08/27/2018 1414   LYMPHSABS 1.2 08/27/2018 1414   MONOABS 0.4 08/27/2018 1414   EOSABS 0.2 08/27/2018 1414   BASOSABS 0.1 08/27/2018 1414   CMP Latest Ref Rng & Units 09/01/2018 08/27/2018 08/12/2018  Glucose 70 - 99 mg/dL - 119(H) 85  BUN 6 - 20 mg/dL - 24(H) 22(H)  Creatinine 0.44 - 1.00 mg/dL - 1.42(H) 1.61(H)  Sodium 135 - 145 mmol/L - 139 139  Potassium 3.5 - 5.1 mmol/L - 4.0 4.2  Chloride 98 - 111 mmol/L - 105 106  CO2 22 - 32 mmol/L -  25 24  Calcium 8.9 - 10.3 mg/dL - 9.9 9.4  Total Protein 6.5 - 8.1 g/dL 8.2(H) 8.4(H) 8.1  Total Bilirubin 0.3 - 1.2 mg/dL 1.0 0.4 0.4  Alkaline Phos 38 - 126 U/L 86 66 65  AST 15 - 41 U/L 223(H) 190(H) 148(H)  ALT 0 - 44 U/L 58(H) 33 31       DIAGNOSTIC IMAGING:  I have independently reviewed the scans and discussed with the patient.   I have reviewed Venita Lick LPN's note and agree with the documentation.  I personally performed a face-to-face visit, made revisions and my assessment and plan is as follows.    ASSESSMENT & PLAN:   Malignant neoplasm of central portion of  left breast in female, estrogen receptor positive ([place]) 1.  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 -Underwent neoadjuvant chemotherapy with dose dense AC followed by Taxol from 07/14/2015 through 11/29/2015. - Left lumpectomy plus SLNB, consistent with infiltrating lobular carcinoma, pleomorphic features, ER/PR positive, HER-2 negative, 0 out of 3 lymph nodes positive. - Zoladex 3.6 mg IM monthly started in August 2017 followed by prophylactic right breast mastectomy plus completion of left breast simple mastectomy, radiation not indicated. -Zoladex stopped due to joint pain, restarted in October 2017, exemestane started in January 2018 - Exemestane and Zoladex held secondary to joint pains, TAH and BSO in April 2018, exemestane 25 mg daily -Moved to [place] from [place] in March 2018, and was seeing Dr. Zenovia Jordan at [facility]. -Genetic testing with multicolor gene panel was negative. -In August 2018 she was switched to tamoxifen 20 mg daily.  In November 2019 she was switched to anastrozole and she took only for few weeks. -Patient started having epigastric and right upper quadrant pains in mid December 2019. -CT CAP on 06/17/2018 shows very large hepatic mass measuring 22 cm, mildly enlarged left internal mammary node at 0.8 cm, borderline enlarged left lower neck level 4 node at 0.9 cm. - Biopsy of the liver mass on 06/26/2018 showed breast cancer, ER/PR positive and HER-2 negative. -Brain MRI in January 2020 was also negative for metastatic disease. -Faslodex started on 07/02/2018 advising 150 mg twice daily started on 07/07/2018.   -PET CT scan on 07/13/2018 shows very large centrally necrotic mass involving the right and left hepatic lobes, one other hypermetabolic lesion in the left lobe.  Multiple hypermetabolic lymph nodes in the upper abdomen,  left supraclavicular space.  No thoracic adenopathy, chest wall mass or pulmonary or osseous lesions. - CT PE protocol on 08/03/2018 did not demonstrate any pulmonary embolus.  No edema or consolidation.  Stable subcentimeter left supraclavicular lymph nodes.   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  No pathological fracture or epidural extension.  Borderline to mild degenerative spinal stenosis at T11 and T12. - She had elevated AST and ALT, up to 5 times upper limit of normal.  She has grade 2 transaminitis from abemaciclib.  However her total bilirubin has not increased from upper limit of normal.  Hence we we will continue Abemaciclib at the same dose level. - I will see her back in 2 weeks for follow-up with repeat labs.  We will monitor her LFTs closely.  2.  Cytopenias: -Mild thrombocytopenia, from abemaciclib has improved to normal.  3.  Intermittent rectal bleeding: - She had a colonoscopy on 08/06/2018 which showed normal ileum, redundant colon, internal and external hemorrhoids. - She does not report any bleeding at this time.  4.  Elevated creatinine: -Last creatinine was 1.42 on 08/27/2018.  This is from abemaciclib.  We will closely monitor it. -No dose reduction needed if creatinine clearance is more than 30.  5.  Back pain: -She has pain in the upper back and mid back regions.  She also has some pain in the right lateral chest wall. -She is taking tramadol 50 mg in the mornings and sometimes adds Advil to it.  She takes oxycodone at bedtime around 10:00. - Pain is not completely controlled with this regimen.  I have recommended lidocaine patch 5%, 12 hours on/12 hours off.        Orders placed this encounter:  No orders of the defined types were placed in this encounter.     Derek Jack, MD [place] [phone]

## 2018-09-01 NOTE — Assessment & Plan Note (Addendum)
1.  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 -Underwent neoadjuvant chemotherapy with dose dense AC followed by Taxol from 07/14/2015 through 11/29/2015. - Left lumpectomy plus SLNB, consistent with infiltrating lobular carcinoma, pleomorphic features, ER/PR positive, HER-2 negative, 0 out of 3 lymph nodes positive. - Zoladex 3.6 mg IM monthly started in August 2017 followed by prophylactic right breast mastectomy plus completion of left breast simple mastectomy, radiation not indicated. -Zoladex stopped due to joint pain, restarted in October 2017, exemestane started in January 2018 - Exemestane and Zoladex held secondary to joint pains, TAH and BSO in April 2018, exemestane 25 mg daily -Moved to [place] from [place] in March 2018, and was seeing Dr. Zenovia Jordan at [place]. -Genetic testing with multicolor gene panel was negative. -In August 2018 she was switched to tamoxifen 20 mg daily.  In November 2019 she was switched to anastrozole and she took only for few weeks. -Patient started having epigastric and right upper quadrant pains in mid December 2019. -CT CAP on 06/17/2018 shows very large hepatic mass measuring 22 cm, mildly enlarged left internal mammary node at 0.8 cm, borderline enlarged left lower neck level 4 node at 0.9 cm. - Biopsy of the liver mass on 06/26/2018 showed breast cancer, ER/PR positive and HER-2 negative. -Brain MRI in January 2020 was also negative for metastatic disease. -Faslodex started on 07/02/2018 advising 150 mg twice daily started on 07/07/2018.   -PET CT scan on 07/13/2018 shows very large centrally necrotic mass involving the right and left hepatic lobes, one other hypermetabolic lesion in the left lobe.  Multiple hypermetabolic lymph nodes in the upper abdomen, left supraclavicular space.  No thoracic adenopathy,  chest wall mass or pulmonary or osseous lesions. - CT PE protocol on 08/03/2018 did not demonstrate any pulmonary embolus.  No edema or consolidation.  Stable subcentimeter left supraclavicular lymph nodes.   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  No pathological fracture or epidural extension.  Borderline to mild degenerative spinal stenosis at T11 and T12. - She had elevated AST and ALT, up to 5 times upper limit of normal.  She has grade 2 transaminitis from abemaciclib.  However her total bilirubin has not increased from upper limit of normal.  Hence we we will continue Abemaciclib at the same dose level. - I will see her back in 2 weeks for follow-up with repeat labs.  We will monitor her LFTs closely.  2.  Cytopenias: -Mild thrombocytopenia, from abemaciclib has improved to normal.  3.  Intermittent rectal bleeding: - She had a colonoscopy on 08/06/2018 which showed normal ileum, redundant colon, internal and external hemorrhoids. - She does not report any bleeding at this time.  4.  Elevated creatinine: -Last creatinine was 1.42 on 08/27/2018.  This is from abemaciclib.  We will closely monitor it. -No dose reduction needed if creatinine clearance is more than 30.  5.  Back pain: -She has pain in the upper back and mid back regions.  She also has some pain in the right lateral chest wall. -She is taking tramadol 50 mg in the mornings and sometimes adds Advil to it.  She takes oxycodone at bedtime around 10:00. - Pain is not completely controlled with this regimen.  I have recommended lidocaine patch 5%, 12 hours on/12  hours off.

## 2018-09-01 NOTE — Patient Instructions (Addendum)
[place] at [facility] Discharge Instructions  You were seen today by Dr. [name]. He went over your recent scan results, it showed spots on the T5 and T8 vertabrae. This is possible metastatic disease due to your history. He would like to watch these and monitor any changes. He would like you to try using a lidocaine patch to help with the pain. He will see you back in 2 weeks for labs and follow up.   Thank you for choosing [place] at [facility] to provide your oncology and hematology care.  To afford each patient quality time with our provider, please arrive at least 15 minutes before your scheduled appointment time.   If you have a lab appointment with the [facility] please come in thru the  Main Entrance and check in at the main information desk  You need to re-schedule your appointment should you arrive 10 or more minutes late.  We strive to give you quality time with our providers, and arriving late affects you and other patients whose appointments are after yours.  Also, if you no show three or more times for appointments you may be dismissed from the clinic at the providers discretion.     Again, thank you for choosing [facility].  Our hope is that these requests will decrease the amount of time that you wait before being seen by our physicians.       _____________________________________________________________  Should you have questions after your visit to [facility], please contact our office at [phone] between the hours of 8:00 a.m. and 4:30 p.m.  Voicemails left after 4:00 p.m. will not be returned until the following business day.  For prescription refill requests, have your pharmacy contact our office and allow 72 hours.    Cancer Center Support Programs:   > Cancer Support Group  2nd Tuesday of the month 1pm-2pm, Journey Room

## 2018-09-02 ENCOUNTER — Other Ambulatory Visit (HOSPITAL_COMMUNITY): Payer: Self-pay | Admitting: *Deleted

## 2018-09-02 ENCOUNTER — Encounter (HOSPITAL_COMMUNITY): Payer: Self-pay

## 2018-09-02 ENCOUNTER — Encounter (HOSPITAL_COMMUNITY): Payer: Self-pay | Admitting: *Deleted

## 2018-09-03 MED ORDER — GABAPENTIN 300 MG CAPSULE
ORAL_CAPSULE | 1 refills | 0 days | Status: CP
Start: 2018-09-03 — End: ?

## 2018-09-07 ENCOUNTER — Encounter (HOSPITAL_COMMUNITY): Payer: Self-pay | Admitting: *Deleted

## 2018-09-07 ENCOUNTER — Other Ambulatory Visit: Payer: Self-pay

## 2018-09-07 ENCOUNTER — Emergency Department (HOSPITAL_COMMUNITY)
Admission: EM | Admit: 2018-09-07 | Discharge: 2018-09-07 | Disposition: A | Discharge status: Home / Self Care | Payer: 59 | Attending: Emergency Medicine | Admitting: Emergency Medicine

## 2018-09-07 ENCOUNTER — Emergency Department (HOSPITAL_COMMUNITY): Payer: 59

## 2018-09-07 DIAGNOSIS — R079 Chest pain, unspecified: Secondary | ICD-10-CM

## 2018-09-07 DIAGNOSIS — R52 Pain, unspecified: Secondary | ICD-10-CM

## 2018-09-07 DIAGNOSIS — R110 Nausea: Secondary | ICD-10-CM

## 2018-09-07 DIAGNOSIS — C50919 Malignant neoplasm of unspecified site of unspecified female breast: Secondary | ICD-10-CM | POA: Diagnosis not present

## 2018-09-07 DIAGNOSIS — M546 Pain in thoracic spine: Secondary | ICD-10-CM

## 2018-09-07 DIAGNOSIS — R1011 Right upper quadrant pain: Secondary | ICD-10-CM | POA: Diagnosis not present

## 2018-09-07 DIAGNOSIS — R748 Abnormal levels of other serum enzymes: Secondary | ICD-10-CM | POA: Diagnosis not present

## 2018-09-07 DIAGNOSIS — E872 Acidosis, unspecified: Secondary | ICD-10-CM

## 2018-09-07 LAB — URINALYSIS, ROUTINE W REFLEX MICROSCOPIC
Glucose, UA: NEGATIVE mg/dL
Hgb urine dipstick: NEGATIVE
Ketones, ur: 5 mg/dL — AB
Leukocytes,Ua: NEGATIVE
Nitrite: NEGATIVE
Protein, ur: 30 mg/dL — AB
Specific Gravity, Urine: 1.018 (ref 1.005–1.030)
pH: 5 (ref 5.0–8.0)

## 2018-09-07 LAB — COMPREHENSIVE METABOLIC PANEL
ALBUMIN: 3.6 g/dL (ref 3.5–5.0)
ALT: 72 U/L — ABNORMAL HIGH (ref 0–44)
AST: 290 U/L — ABNORMAL HIGH (ref 15–41)
Alkaline Phosphatase: 110 U/L (ref 38–126)
Anion gap: 13 (ref 5–15)
BUN: 20 mg/dL (ref 6–20)
CO2: 21 mmol/L — ABNORMAL LOW (ref 22–32)
Calcium: 10 mg/dL (ref 8.9–10.3)
Chloride: 103 mmol/L (ref 98–111)
Creatinine, Ser: 1.11 mg/dL — ABNORMAL HIGH (ref 0.44–1.00)
GFR calc Af Amer: >60 mL/min (ref >60)
GFR calc non Af Amer: 58 mL/min — ABNORMAL LOW (ref >60)
Glucose, Bld: 83 mg/dL (ref 70–99)
Potassium: 3.7 mmol/L (ref 3.5–5.1)
Sodium: 137 mmol/L (ref 135–145)
Total Bilirubin: 4 mg/dL — ABNORMAL HIGH (ref 0.3–1.2)
Total Protein: 8.3 g/dL — ABNORMAL HIGH (ref 6.5–8.1)

## 2018-09-07 LAB — LACTIC ACID, PLASMA
Lactic Acid, Venous: 2.3 mmol/L (ref 0.5–1.9)
Lactic Acid, Venous: 2.4 mmol/L (ref 0.5–1.9)

## 2018-09-07 LAB — CBC WITH DIFFERENTIAL/PLATELET
Abs Immature Granulocytes: 0.01 10*3/uL (ref 0.00–0.07)
Basophils Absolute: 0.1 10*3/uL (ref 0.0–0.1)
Basophils Relative: 2 %
Eosinophils Absolute: 0.1 10*3/uL (ref 0.0–0.5)
Eosinophils Relative: 2 %
HCT: 34 % — ABNORMAL LOW (ref 36.0–46.0)
Hemoglobin: 11.5 g/dL — ABNORMAL LOW (ref 12.0–15.0)
IMMATURE GRANULOCYTES: 0 %
Lymphocytes Relative: 14 %
Lymphs Abs: 1.1 10*3/uL (ref 0.7–4.0)
MCH: 30.1 pg (ref 26.0–34.0)
MCHC: 33.8 g/dL (ref 30.0–36.0)
MCV: 89 fL (ref 80.0–100.0)
Monocytes Absolute: 0.4 10*3/uL (ref 0.1–1.0)
Monocytes Relative: 6 %
NEUTROS ABS: 5.7 10*3/uL (ref 1.7–7.7)
Neutrophils Relative %: 76 %
Platelets: 156 10*3/uL (ref 150–400)
RBC: 3.82 MIL/uL — ABNORMAL LOW (ref 3.87–5.11)
RDW: 15.3 % (ref 11.5–15.5)
WBC: 7.4 10*3/uL (ref 4.0–10.5)
nRBC: 0 % (ref 0.0–0.2)

## 2018-09-07 LAB — TROPONIN I
Troponin I: <0.03 ng/mL (ref <0.03)
Troponin I: <0.03 ng/mL (ref <0.03)

## 2018-09-07 LAB — LIPASE, BLOOD: Lipase: 19 U/L (ref 11–51)

## 2018-09-07 IMAGING — CT CT ABDOMEN AND PELVIS WITH CONTRAST
3 of 11 series · 12 of 46 positions shown, 18 images · IV contrast (Isovue)
Comparison: [DATE] chest CT.  Abdominal CT [DATE]

CLINICAL DATA: Cancer patient with new onset chest pain

EXAM:
CT ANGIOGRAPHY CHEST
CT ABDOMEN AND PELVIS WITH CONTRAST
TECHNIQUE: Multidetector CT imaging of the chest was performed using the
standard protocol during bolus administration of intravenous
contrast. Multiplanar CT image reconstructions and MIPs were
obtained to evaluate the vascular anatomy. Multidetector CT imaging
of the abdomen and pelvis was performed using the standard protocol
during bolus administration of intravenous contrast.
CONTRAST:  100mL OMNIPAQUE IOHEXOL 350 MG/ML SOLN

[Series 5: thins · axial · 0.74mm/px · z∈[+1045,+1242]mm · 6 of 316 slices shown]
[im 20/316  soft-tissue]
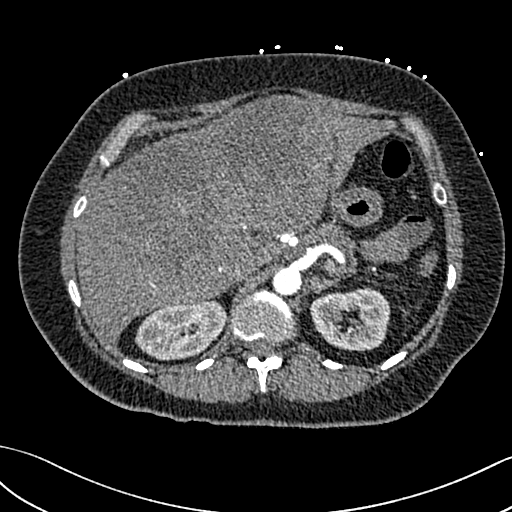
[im 60/316  soft-tissue]
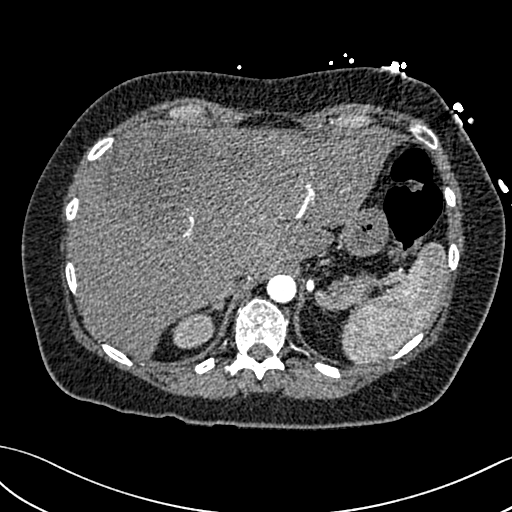
[im 99/316  soft-tissue]
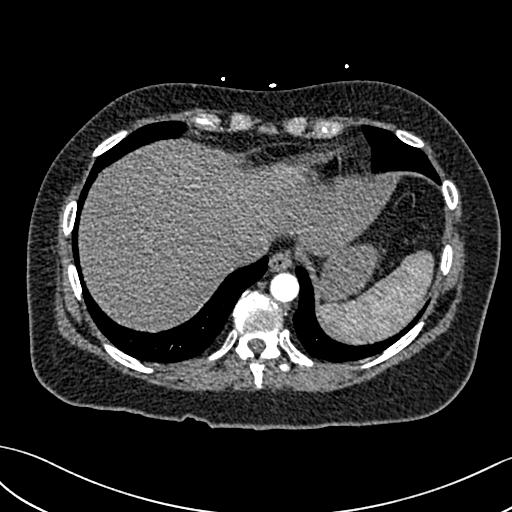
[im 138/316  soft-tissue]
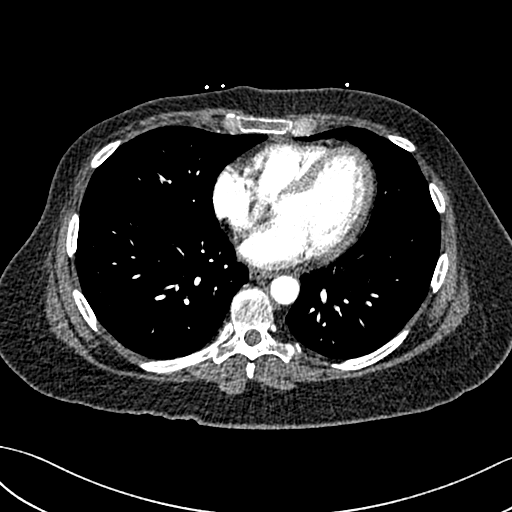
[im 178/316  soft-tissue]
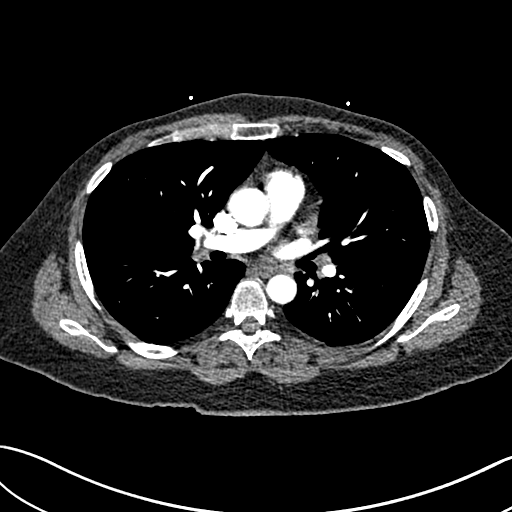
[im 217/316  soft-tissue]
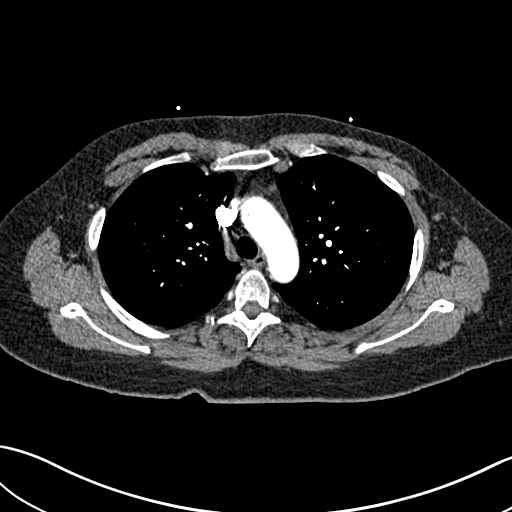

[Series 7: coronal mpr · coronal · 0.62mm/px · 2 of 142 slices shown, 3 images]
[im 48/142  soft-tissue]
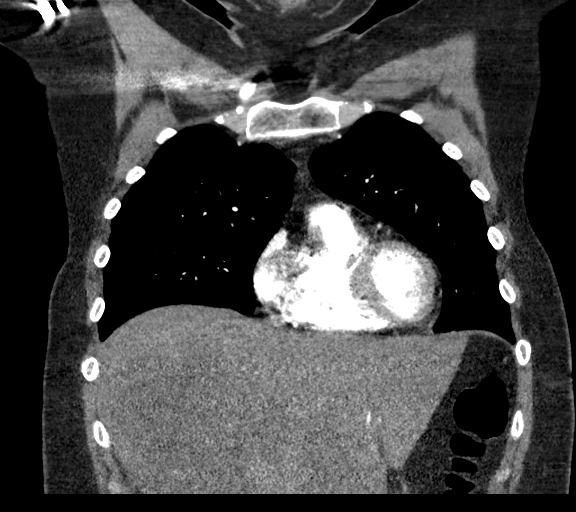
[im 48/142  bone]
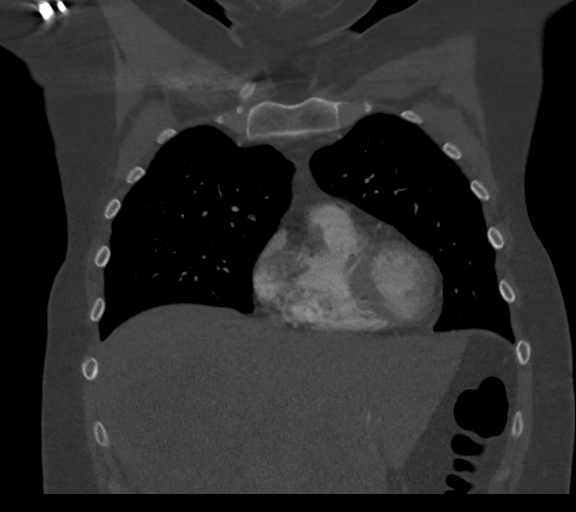
[im 95/142  soft-tissue]
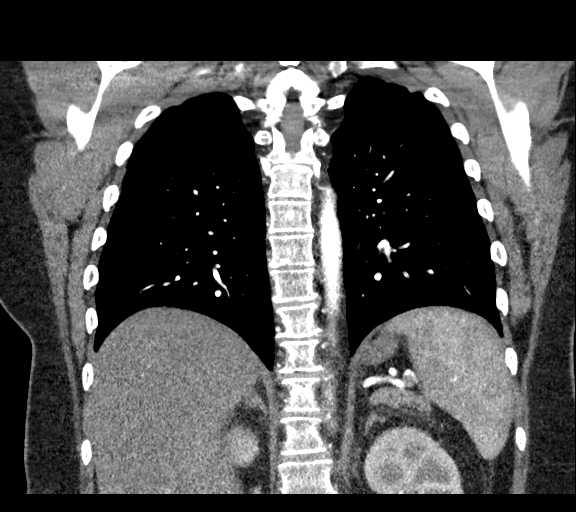

[Series 12: axial st · axial · 0.77mm/px · z∈[+772,+1072]mm · 4 of 102 slices shown, 9 images]
[im 21/102  soft-tissue]
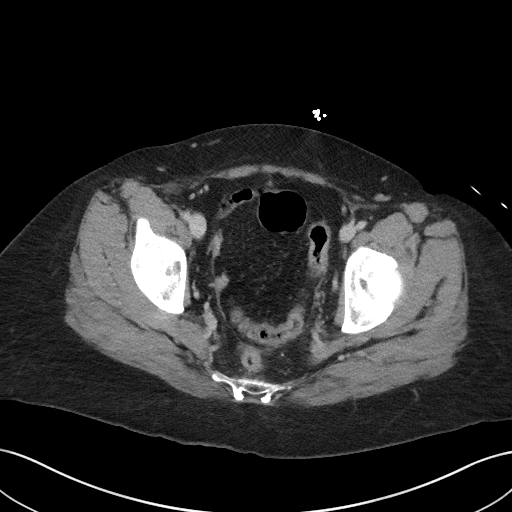
[im 21/102  lung]
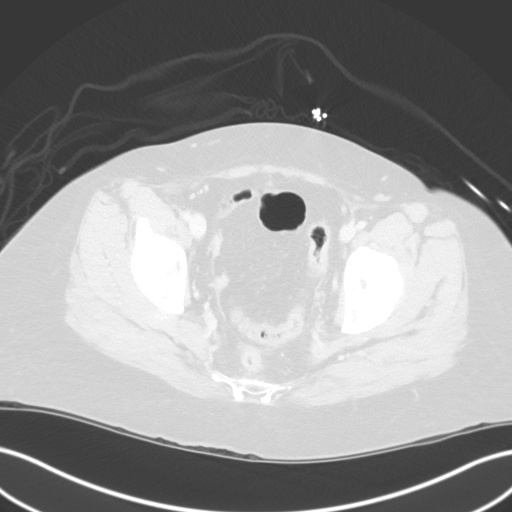
[im 21/102  bone]
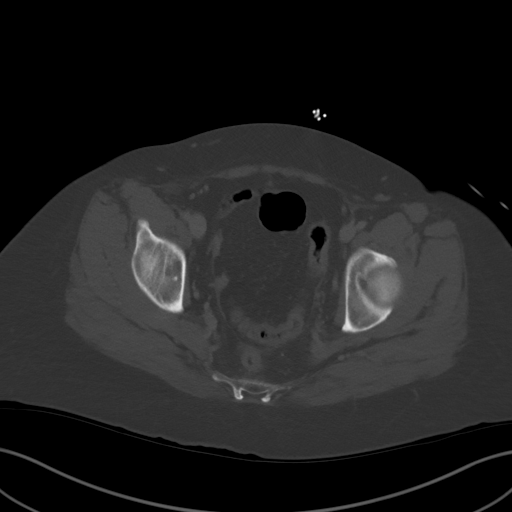
[im 41/102  soft-tissue]
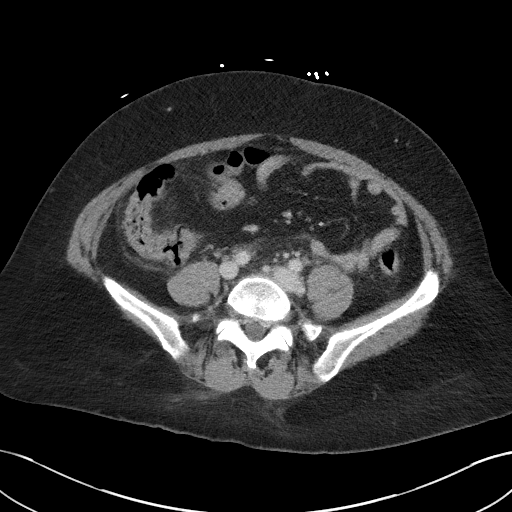
[im 41/102  lung]
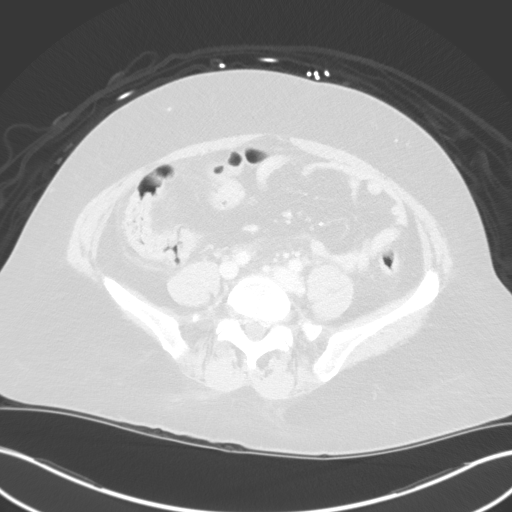
[im 61/102  soft-tissue]
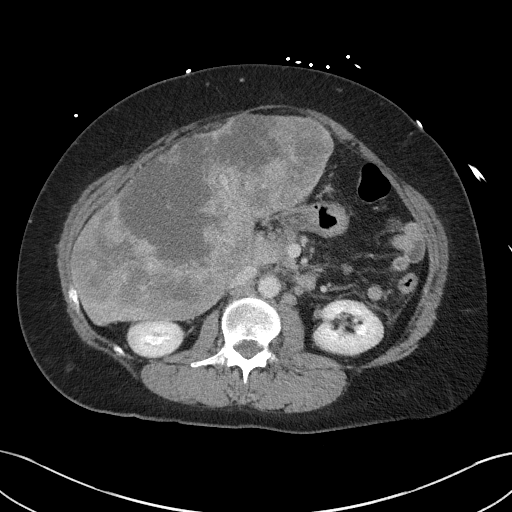
[im 61/102  lung]
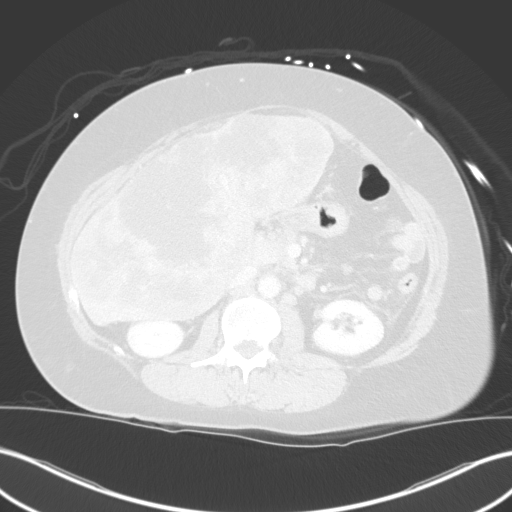
[im 81/102  soft-tissue]
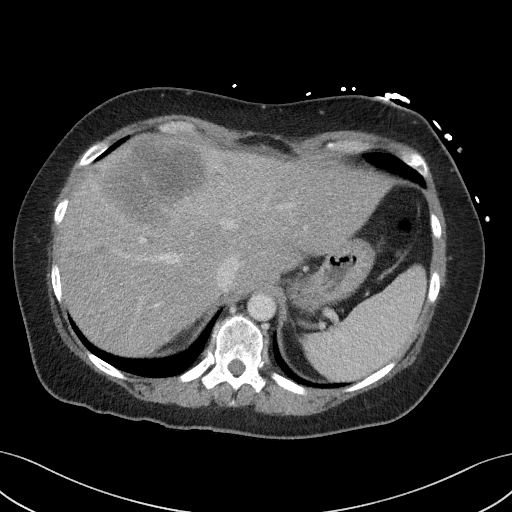
[im 81/102  lung]
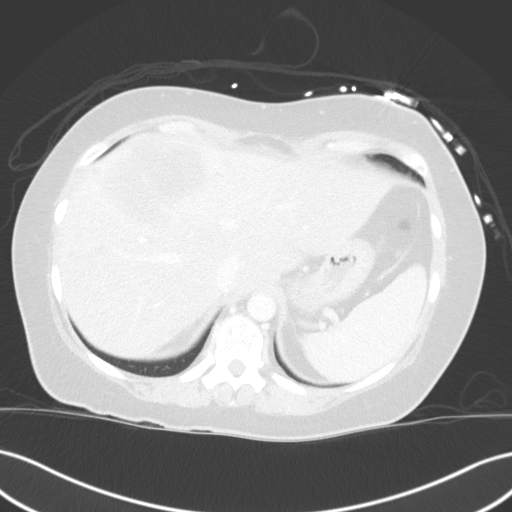

[12 of 46 positions shown; findings below may reference images not displayed]

FINDINGS: CTA CHEST FINDINGS

Cardiovascular: Satisfactory opacification of the pulmonary arteries
to the segmental level. No evidence of pulmonary embolism. Normal
heart size. No pericardial effusion.

Mediastinum/Nodes: New prominence of of bilateral hilar lymph node
size.

Lungs/Pleura: There is no edema, consolidation, effusion, or
pneumothorax.

Musculoskeletal: Small spinal metastases by recent thoracic MRI. No
acute osseous finding or destructive process.

Other: Bilateral mastectomy

Review of the MIP images confirms the above findings.

CT ABDOMEN and PELVIS FINDINGS

Hepatobiliary: Large necrotic mass involving most of the liver with
small satellite nodules scattered throughout the remaining
parenchyma. No evident rupture. There is mass effect on the IVC
central portal venous system.The gallbladder is high-density and
collapsed.

Pancreas: Unremarkable.

Spleen: Unremarkable.

Adrenals/Urinary Tract: Negative adrenals. No hydronephrosis or
stone. Unremarkable bladder.

Stomach/Bowel:  No obstruction. No evidence of bowel inflammation.

Vascular/Lymphatic: No acute vascular abnormality. No mass or
adenopathy.

Reproductive:Hysterectomy.

Other: No ascites or pneumoperitoneum.

Musculoskeletal: No acute abnormalities.

Review of the MIP images confirms the above findings.
IMPRESSION: Chest CTA:

1. Negative for pulmonary embolism or other acute finding.
2. Prominent bilateral hilar lymph nodes size, attention on
follow-up staging scans.

Abdominal CT:

1. Extensive malignancy in the liver, known from recent imaging.
There are new satellite nodules when compared to [DATE]
abdominal CT.
2. No superimposed acute finding.

## 2018-09-07 MED ORDER — IOHEXOL 350 MG/ML SOLN
100.0000 mL | Freq: Once | INTRAVENOUS | Status: AC | PRN
  Administered 2018-09-07: 100 mL via INTRAVENOUS

## 2018-09-07 MED ORDER — SODIUM CHLORIDE 0.9 % IV BOLUS
1000.0000 mL | Freq: Once | INTRAVENOUS | Status: AC
  Administered 2018-09-07: 1000 mL via INTRAVENOUS

## 2018-09-07 MED ORDER — MORPHINE SULFATE (PF) 4 MG/ML IV SOLN
4.0000 mg | Freq: Once | INTRAVENOUS | Status: AC
  Administered 2018-09-07: 4 mg via INTRAVENOUS
  Filled 2018-09-07: qty 1

## 2018-09-07 MED ORDER — ONDANSETRON HCL 4 MG/2ML IJ SOLN
4.0000 mg | Freq: Once | INTRAMUSCULAR | Status: AC
  Administered 2018-09-07: 4 mg via INTRAVENOUS
  Filled 2018-09-07: qty 2

## 2018-09-07 NOTE — ED Notes (Signed)
ED Provider at bedside. 

## 2018-09-07 NOTE — ED Notes (Signed)
Have given pt juice to drink. Is alert and oriented and is not in any pain

## 2018-09-07 NOTE — ED Notes (Signed)
Patient transported to CT 

## 2018-09-07 NOTE — ED Notes (Signed)
Pt states that the pain and nausea is starting to return, Dr [name] notified, additional orders given,

## 2018-09-07 NOTE — ED Notes (Signed)
Date and time results received: 09/07/18 06:43 (use smartphrase ".now" to insert current time)  Test: lactic acid Critical Value: 2.4 Name of Provider Notified: Dr [name]  Orders Received? Or Actions Taken?: \ none

## 2018-09-07 NOTE — ED Triage Notes (Addendum)
Pt c/o abd pain that has gotten worse, nausea and diarrhea that started over the weekend, has hx of breast cancer with mets to liver, pt also states that her urine is orange and is closing to a "burning" sensation,

## 2018-09-07 NOTE — ED Provider Notes (Addendum)
7:15 AM- Checkout from Dr. [name], awaiting call back from Oncology.  She is being evaluated for nausea, vomiting and chest/back pain.  Evaluation indicated ongoing transaminitis with increasing T bilirubin over the last 6 days.  Lactic acid elevated without clear source for infection.  Screening for ACS negative.  CTs indicated no PE, thoracic vertebrae mets, abdomen with lesions, increasing number.  7:30 AM-case discussed with Dr. [name], her oncologist.  We discussed the findings, and his recommendation is to treat her symptomatically at this time, and stop the Verzenio.  7:40 AM-reevaluated the patient.  She states her pain is controlled, her vomiting is controlled, and that she has other medications at home that she needs.  She continues to have a very mild pressure-like sensation in her left anterior chest, that she localizes to a small area just lateral to the lower sternum, on the left.  She states that her biggest problem is having difficulty sleeping, because it is difficult to find a painless way to lie down.  She frequently naps during the day, because "I try to sleep whenever I can."  We discussed sleep hygiene techniques, and she is agreeable to try them.  Patient feels well enough at this point to drive home, and will follow up with her oncologist, as scheduled.  She will stop taking the Verzenio.   Medical decision making-I have reviewed the case findings in detail.  Doubt ACS, PE, pneumonia, acute bacterial infection, metabolic instability or impending vascular collapse.  Patient has signs and symptoms of metastatic breast cancer, possible transaminitis/T bilirubin elevation from Verzenio.  Nonspecific lactate elevation, doubt sepsis.  No signs and symptoms, for Covid-19.  Pamela Long was evaluated in Emergency Department on 09/07/2018 for the symptoms described in the history of present illness. She was evaluated in the context of the global COVID-19 pandemic, which necessitated  consideration that the patient might be at risk for infection with the SARS-CoV-2 virus that causes COVID-19. Institutional protocols and algorithms that pertain to the evaluation of patients at risk for COVID-19 are in a state of rapid change based on information released by regulatory bodies including the CDC and federal and state organizations. These policies and algorithms were followed during the patient's care in the ED.    Daleen Bo, MD 09/07/18 [phone]

## 2018-09-07 NOTE — ED Notes (Signed)
Per Dr. Eulis Foster, pt is able to go home. Will remove IV

## 2018-09-07 NOTE — ED Notes (Signed)
Date and time results received: 09/07/18 04:15 (use smartphrase ".now" to insert current time)  Test: lactic acid 2.3 Critical Value: lactic acid 2.3  Name of Provider Notified: Dr [name]  Orders Received? Or Actions Taken?: additional orders given,

## 2018-09-07 NOTE — ED Provider Notes (Signed)
[facility] EMERGENCY DEPARTMENT Provider Note   CSN: [id number] Arrival date & time: 09/07/18  1638  Time seen 3:50 AM  History   Chief Complaint Chief Complaint  Patient presents with   Emesis    HPI Pamela Long is a 48 y.o. female.     HPI patient has breast cancer with recent diagnosis of mets to her liver and possibly 2 areas in her thoracic spine, T5 and T8 vertebral bodies.  She was just seen by her cancer doctor, Dr. [name] on March 24 and at that time she was complaining of shortness of breath with exertion and abdominal pain.  She was noted to have increasing liver function tests however her total bilirubin was normal and she was continued on her Abemaciclib.  She states she is having the same abdominal pain she has had "for a while" for at least 3 weeks.  She states it got worse this weekend.  She describes it as a pressure pain.  She states her whole abdomen hurts.  She also complains of pain in her back at the bra line area and this appears to be the pain she was having before.  She states laying on her right side makes the pain worse.  She states "a couple hours ago" she started having left chest pain.  She states her highest fever yesterday was 99.2.  She denies any chills.  She complains of nausea without vomiting that started on March 28.  She states she was constipated however she then had diarrhea about 5 times on the 28th after having a couple of hard stools.  She states her urine has been getting dark in color.  She denies any dysuria but states her urine feels hot when she urinates.  She has been using Zofran without relief.  She states laying on her right side makes her abdominal pain worse.  She states she is having difficulty sleeping and can only sleep for about an hour at a time.  PCP Doree Albee, MD Oncology Dr. [name]  Past Medical History:  Diagnosis Date   Anemia    Anxiety    Asthma    Breast cancer ([place])    Left, 2017    Depression    GERD (gastroesophageal reflux disease)    Migraine    OAB (overactive bladder)    Seasonal allergies     Patient Active Problem List   Diagnosis Date Noted   RUQ pain 07/08/2018   Metastatic breast cancer ([place]) 07/02/2018   GERD (gastroesophageal reflux disease) 04/02/2018   Rectal bleeding 04/02/2018   Constipation 04/02/2018   OAB (overactive bladder) 07/31/2017   Vitamin D deficiency 02/26/2017   Prediabetes 02/26/2017   HLD (hyperlipidemia) 02/26/2017   Asthma 02/12/2017   Depression 02/12/2017   Migraines 02/12/2017   Arthralgia 02/12/2017   Sleep-disordered breathing 02/12/2017   Positive ANA (antinuclear antibody) 02/12/2017   Angular cheilitis 02/12/2017   Obesity (BMI 35.0-39.9 without comorbidity) 02/12/2017   Malignant neoplasm of central portion of left breast in female, estrogen receptor positive ([place]) 01/05/2017    Past Surgical History:  Procedure Laterality Date   ABDOMINAL HYSTERECTOMY     breast cancer   ANKLE RECONSTRUCTION Right 1989   BREAST SURGERY     COLONOSCOPY WITH PROPOFOL N/A 08/06/2018   Procedure: COLONOSCOPY WITH PROPOFOL;  Surgeon: Danie Binder, MD;  Location: [facility];  Service: Endoscopy;  Laterality: N/A;  1:30pm   FOREIGN BODY REMOVAL N/A 08/29/2017   from lip  KNEE SURGERY Right 1989   bone spur   MASTECTOMY  2017   bil mastectomies   SINUSOTOMY       OB History    Gravida  1   Para  1   Term  1   Preterm      AB      Living        SAB      TAB      Ectopic      Multiple      Live Births               Home Medications    Prior to Admission medications   Medication Sig Start Date End Date Taking? Authorizing Provider  abemaciclib (VERZENIO) 150 MG tablet Take 1 tablet (150 mg total) by mouth 2 (two) times daily. Swallow tablets whole. Do not chew, crush, or split tablets before swallowing. 08/26/18   Derek Jack, MD  Carboxymeth-Glyc-Polysorb  PF (REFRESH OPTIVE MEGA-3) 0.5-1-0.5 % SOLN Apply 1 drop to eye 2 (two) times daily.    [provider]  Carboxymeth-Glyc-Polysorb PF (REFRESH OPTIVE MEGA-3) 0.5-1-0.5 % SOLN Apply to eye.    [provider]  Cholecalciferol (VITAMIN D3 PO) Take 5 each by mouth daily.     [provider]  escitalopram (LEXAPRO) 20 MG tablet Take 1 tablet (20 mg total) by mouth daily. 02/12/17   Raylene Everts, MD  fulvestrant (FASLODEX) 250 MG/5ML injection Inject 250 mg into the muscle every 14 (fourteen) days. One injection each buttock over 1-2 minutes. Warm prior to use.    [provider]  gabapentin (NEURONTIN) 300 MG capsule Take 600 mg by mouth at bedtime. Per patient 600 mg at bedtime 03/04/18   [provider]  lidocaine (LIDODERM) 5 % Place 1 patch onto the skin daily. Remove & Discard patch within 12 hours or as directed by MD 09/01/18   Derek Jack, MD  loperamide (IMODIUM) 2 MG capsule Take 2-4 mg by mouth daily as needed for diarrhea or loose stools.    [provider]  naproxen sodium (ALEVE) 220 MG tablet Take 220-440 mg by mouth 2 (two) times daily as needed (for pain or headache).     [provider]  ondansetron (ZOFRAN) 4 MG tablet Take 1 tablet (4 mg total) by mouth every 8 (eight) hours as needed for nausea or vomiting. 06/17/18   Hayden Rasmussen, MD  oxyCODONE (OXY IR/ROXICODONE) 5 MG immediate release tablet Take 1 tablet (5 mg total) by mouth every 8 (eight) hours as needed for severe pain. Patient taking differently: Take 5 mg by mouth at bedtime. Per pt she takes 5 mg at bedtime 07/16/18   Lockamy, Randi L, NP-C  pantoprazole (PROTONIX) 40 MG tablet TAKE 1 TABLET BY MOUTH DAILY BEFORE BREAKFAST Patient taking differently: Take 40 mg by mouth daily before breakfast.  06/26/18   Annitta Needs, NP  Polyvinyl Alcohol-Povidone PF (REFRESH) 1.4-0.6 % SOLN Place 1 drop into both eyes daily as needed (for dry eyes).    [provider]  rizatriptan (MAXALT-MLT) 10 MG disintegrating tablet Take 10 mg by mouth every 2 (two) hours as needed for migraine.     [provider]  traMADol (ULTRAM) 50 MG tablet Take 1 tablet (50 mg total) by mouth 2 (two) times daily. 08/19/18   Derek Jack, MD  TROKENDI XR 50 MG CP24 Take 50 mg by mouth 2 (two) times daily.    [provider]    Family History Family History  Problem Relation Age of Onset   Arthritis Mother    COPD Mother    Depression Mother    Diabetes Mother    Kidney disease Father    Heart disease Father 75   Drug abuse Father    Hypertension Father    Diabetes Daughter    Hashimoto's thyroiditis Daughter    Irritable bowel syndrome Daughter    Autism spectrum disorder Daughter    Early death Maternal Grandmother        drowned   Early death Maternal Grandfather    Heart disease Maternal Grandfather    Heart disease Paternal Grandfather    Breast cancer Maternal Aunt    Breast cancer Cousin    Lung cancer Maternal Aunt    Lung cancer Maternal Uncle    Colon cancer Neg Hx     Social History Social History   Tobacco Use   Smoking status: Never Smoker   Smokeless tobacco: Never Used  Substance Use Topics   Alcohol use: No   Drug use: No     Allergies   Corn-containing products; Tape; Amoxicillin; Caffeine; and Tetanus toxoids   Review of Systems Review of Systems  All other systems reviewed and are negative.    Physical Exam Updated Vital Signs BP 127/69    Pulse 99    Temp 98.5 F (36.9 C) (Oral)    Resp 20    Ht 5\' 5"  (1.651 m)    Wt 88.9 kg    SpO2 95%    BMI 32.61 kg/m   Vital signs normal    Physical Exam Vitals signs and nursing note reviewed.  Constitutional:      General: She is in acute distress.     Appearance: Normal appearance. She is well-developed. She is not ill-appearing or toxic-appearing.  HENT:     Head: Normocephalic and atraumatic.     Right Ear:  External ear normal.     Left Ear: External ear normal.     Nose: Nose normal. No mucosal edema or rhinorrhea.     Mouth/Throat:     Mouth: Mucous membranes are moist.     Dentition: No dental abscesses.     Pharynx: No oropharyngeal exudate, posterior oropharyngeal erythema or uvula swelling.  Eyes:     General: Scleral icterus present.     Extraocular Movements: Extraocular movements intact.     Conjunctiva/sclera: Conjunctivae normal.     Pupils: Pupils are equal, round, and reactive to light.  Neck:     Musculoskeletal: Full passive range of motion without pain, normal range of motion and neck supple.  Cardiovascular:     Rate and Rhythm: Normal rate and regular rhythm.     Heart sounds: Normal heart sounds. No murmur. No friction rub. No gallop.   Pulmonary:     Effort: Pulmonary effort is normal. No respiratory distress.     Breath sounds: Normal breath sounds. No wheezing, rhonchi or rales.  Chest:     Chest wall: No tenderness or crepitus.       Comments: Area of chest pain noted Abdominal:     General: Bowel sounds are normal. There is no distension.     Palpations: Abdomen is soft. There is mass.     Tenderness: There is no abdominal tenderness. There is no guarding or rebound.     Comments: Patient has a mass in the epigastric area which she states has been there.  Musculoskeletal:  Normal range of motion.        General: No tenderness.       Back:     Comments: Moves all extremities well.  Area of her complaints of back pain noted  Skin:    General: Skin is warm and dry.     Capillary Refill: Capillary refill takes less than 2 seconds.     Coloration: Skin is jaundiced. Skin is not pale.     Findings: No erythema or rash.  Neurological:     General: No focal deficit present.     Mental Status: She is alert and oriented to person, place, and time.     Cranial Nerves: No cranial nerve deficit.  Psychiatric:        Mood and Affect: Affect is flat.        Speech:  Speech is delayed.        Behavior: Behavior is slowed.      ED Treatments / Results  Labs (all labs ordered are listed, but only abnormal results are displayed) Results for orders placed or performed during the hospital encounter of 09/07/18  Comprehensive metabolic panel  Result Value Ref Range   Sodium 137 135 - 145 mmol/L   Potassium 3.7 3.5 - 5.1 mmol/L   Chloride 103 98 - 111 mmol/L   CO2 21 (L) 22 - 32 mmol/L   Glucose, Bld 83 70 - 99 mg/dL   BUN 20 6 - 20 mg/dL   Creatinine, Ser 1.11 (H) 0.44 - 1.00 mg/dL   Calcium 10.0 8.9 - 10.3 mg/dL   Total Protein 8.3 (H) 6.5 - 8.1 g/dL   Albumin 3.6 3.5 - 5.0 g/dL   AST 290 (H) 15 - 41 U/L   ALT 72 (H) 0 - 44 U/L   Alkaline Phosphatase 110 38 - 126 U/L   Total Bilirubin 4.0 (H) 0.3 - 1.2 mg/dL   GFR calc non Af Amer 58 (L) >60 mL/min   GFR calc Af Amer >60 >60 mL/min   Anion gap 13 5 - 15  CBC with Differential  Result Value Ref Range   WBC 7.4 4.0 - 10.5 K/uL   RBC 3.82 (L) 3.87 - 5.11 MIL/uL   Hemoglobin 11.5 (L) 12.0 - 15.0 g/dL   HCT 34.0 (L) 36.0 - 46.0 %   MCV 89.0 80.0 - 100.0 fL   MCH 30.1 26.0 - 34.0 pg   MCHC 33.8 30.0 - 36.0 g/dL   RDW 15.3 11.5 - 15.5 %   Platelets 156 150 - 400 K/uL   nRBC 0.0 0.0 - 0.2 %   Neutrophils Relative % 76 %   Neutro Abs 5.7 1.7 - 7.7 K/uL   Lymphocytes Relative 14 %   Lymphs Abs 1.1 0.7 - 4.0 K/uL   Monocytes Relative 6 %   Monocytes Absolute 0.4 0.1 - 1.0 K/uL   Eosinophils Relative 2 %   Eosinophils Absolute 0.1 0.0 - 0.5 K/uL   Basophils Relative 2 %   Basophils Absolute 0.1 0.0 - 0.1 K/uL   Immature Granulocytes 0 %   Abs Immature Granulocytes 0.01 0.00 - 0.07 K/uL  Lactic acid, plasma  Result Value Ref Range   Lactic Acid, Venous 2.3 (HH) 0.5 - 1.9 mmol/L  Lactic acid, plasma  Result Value Ref Range   Lactic Acid, Venous 2.4 (HH) 0.5 - 1.9 mmol/L  Urinalysis, Routine w reflex microscopic  Result Value Ref Range   Color, Urine AMBER (A) YELLOW   APPearance CLEAR  CLEAR   Specific Gravity, Urine 1.018 1.005 - 1.030   pH 5.0 5.0 - 8.0   Glucose, UA NEGATIVE NEGATIVE mg/dL   Hgb urine dipstick NEGATIVE NEGATIVE   Bilirubin Urine SMALL (A) NEGATIVE   Ketones, ur 5 (A) NEGATIVE mg/dL   Protein, ur 30 (A) NEGATIVE mg/dL   Nitrite NEGATIVE NEGATIVE   Leukocytes,Ua NEGATIVE NEGATIVE   RBC / HPF 0-5 0 - 5 RBC/hpf   WBC, UA 0-5 0 - 5 WBC/hpf   Bacteria, UA RARE (A) NONE SEEN   Squamous Epithelial / LPF 0-5 0 - 5   Mucus PRESENT    Hyaline Casts, UA PRESENT    Granular Casts, UA PRESENT    Non Squamous Epithelial 6-10 (A) NONE SEEN  Lipase, blood  Result Value Ref Range   Lipase 19 11 - 51 U/L  Troponin I - Once  Result Value Ref Range   Troponin I <0.03 <0.03 ng/mL    Laboratory interpretation all normal except new elevation of the total bilirubin, some minor elevation of her prior elevated LFTs, persistently elevated lactic acid, mild anemia     Results for orders placed or performed in visit on 09/01/18  APTT  Result Value Ref Range   aPTT 33 24 - 36 seconds  Protime-INR  Result Value Ref Range   Prothrombin Time 14.8 11.4 - 15.2 seconds   INR 1.2 0.8 - 1.2  Fibrinogen  Result Value Ref Range   Fibrinogen 405 210 - 475 mg/dL  Hepatic function panel  Result Value Ref Range   Total Protein 8.2 (H) 6.5 - 8.1 g/dL   Albumin 3.8 3.5 - 5.0 g/dL   AST 223 (H) 15 - 41 U/L   ALT 58 (H) 0 - 44 U/L   Alkaline Phosphatase 86 38 - 126 U/L   Total Bilirubin 1.0 0.3 - 1.2 mg/dL   Bilirubin, Direct 0.5 (H) 0.0 - 0.2 mg/dL   Indirect Bilirubin 0.5 0.3 - 0.9 mg/dL       EKG EKG Interpretation  Date/Time:  Monday September 07 2018 04:19:43 EDT Ventricular Rate:  102 PR Interval:    QRS Duration: 85 QT Interval:  369 QTC Calculation: 481 R Axis:   91 Text Interpretation:  Sinus tachycardia Borderline right axis deviation Consider anterior infarct Borderline T abnormalities, inferior leads No old tracing to compare Confirmed by Rolland Porter  [phone]) on 09/07/2018 4:26:10 AM   Radiology Ct Angio Chest Pe W/cm &/or Wo Cm  Ct Abdomen Pelvis W Contrast  Result Date: 09/07/2018 CLINICAL DATA:  Cancer patient with new onset chest pain EXAM: CT ANGIOGRAPHY CHEST CT ABDOMEN AND PELVIS WITH CONTRAST TECHNIQUE: Multidetector CT imaging of the chest was performed using the standard protocol during bolus administration of intravenous contrast. Multiplanar CT image reconstructions and MIPs were obtained to evaluate the vascular anatomy. Multidetector CT imaging of the abdomen and pelvis was performed using the standard protocol during bolus administration of intravenous contrast. CONTRAST:  114mL OMNIPAQUE IOHEXOL 350 MG/ML SOLN COMPARISON:  08/03/2018 chest CT.  Abdominal CT 06/17/2018 FINDINGS: CTA CHEST FINDINGS Cardiovascular: Satisfactory opacification of the pulmonary arteries to the segmental level. No evidence of pulmonary embolism. Normal heart size. No pericardial effusion. Mediastinum/Nodes: New prominence of of bilateral hilar lymph node size. Lungs/Pleura: There is no edema, consolidation, effusion, or pneumothorax. Musculoskeletal: Small spinal metastases by recent thoracic MRI. No acute osseous finding or destructive process. Other: Bilateral mastectomy Review of the MIP images confirms the above findings. CT ABDOMEN and PELVIS FINDINGS  Hepatobiliary: Large necrotic mass involving most of the liver with small satellite nodules scattered throughout the remaining parenchyma. No evident rupture. There is mass effect on the IVC central portal venous system.The gallbladder is high-density and collapsed. Pancreas: Unremarkable. Spleen: Unremarkable. Adrenals/Urinary Tract: Negative adrenals. No hydronephrosis or stone. Unremarkable bladder. Stomach/Bowel:  No obstruction. No evidence of bowel inflammation. Vascular/Lymphatic: No acute vascular abnormality. No mass or adenopathy. Reproductive:Hysterectomy. Other: No ascites or pneumoperitoneum.  Musculoskeletal: No acute abnormalities. Review of the MIP images confirms the above findings. IMPRESSION: Chest CTA: 1. Negative for pulmonary embolism or other acute finding. 2. Prominent bilateral hilar lymph nodes size, attention on follow-up staging scans. Abdominal CT: 1. Extensive malignancy in the liver, known from recent imaging. There are new satellite nodules when compared to 06/17/2018 abdominal CT. 2. No superimposed acute finding. Electronically Signed   By: Monte Fantasia M.D.   On: 09/07/2018 06:11     Mr Thoracic Spine W Wo Contrast  Result Date: 08/31/2018 CLINICAL DATA:  48 year old female with metastatic breast cancer. Three weeks of low back pain.  IMPRESSION: 1. 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 No pathologic fracture or epidural extension. 2. Boderline to mild degenerative spinal stenosis at T11-T12. Electronically Signed   By: Genevie Ann M.D.   On: 08/31/2018 20:59   Procedures Procedures (including critical care time)  Medications Ordered in ED Medications  sodium chloride 0.9 % bolus 1,000 mL (0 mLs Intravenous Stopped 09/07/18 0545)  ondansetron (ZOFRAN) injection 4 mg (4 mg Intravenous Given 09/07/18 0410)  morphine 4 MG/ML injection 4 mg (4 mg Intravenous Given 09/07/18 0410)  sodium chloride 0.9 % bolus 1,000 mL (0 mLs Intravenous Stopped 09/07/18 0623)  sodium chloride 0.9 % bolus 1,000 mL (1,000 mLs Intravenous New Bag/Given 09/07/18 0624)  iohexol (OMNIPAQUE) 350 MG/ML injection 100 mL (100 mLs Intravenous Contrast Given 09/07/18 0515)  morphine 4 MG/ML injection 4 mg (4 mg Intravenous Given 09/07/18 0638)  ondansetron (ZOFRAN) injection 4 mg (4 mg Intravenous Given 09/07/18 7858)     Initial Impression / Assessment and Plan / ED Course  I have reviewed the triage vital signs and the nursing notes.  Pertinent labs & imaging results that were available during my care of the patient were reviewed by me and considered in my medical decision  making (see chart for details).        Patient presents with new onset of chest pain with some shortness of breath that she has had for a few weeks.  Patient is currently being treated for metastatic breast cancer with a large mass to her liver.  She states she has just been basically not doing any activity.  Concern is for PE.  She also complains of possibly worsening abdominal pain, is not clear to me if it is worse or she is just not tolerating it anymore.  According to her doctors notes she has been taking tramadol for pain with oxycodone at bedtime (the [facility] does not show this) and he recommended she do a lidocaine patch when he saw her on the 24th.  There was no patch seen on her tonight.  Concern is that she could have bleeding in her metastatic lesion or something else acute going on.  Therefore CTA of the chest and CT of the abdomen pelvis was done to evaluate her complaints.  EKG and troponin were also done to evaluate her complaints of chest pain.  Patient was given IV pain and nausea medication.  Patient's repeat lactic acid is basically unchanged.  She had 3 L of normal saline ordered.  Patient states her pain is returning, her pain and nausea medicine was repeated.  6:30 AM I spoke to the patient about her test results.  I am going to speak to the oncologist on call, she states she takes her Verzenio orally every day.  I suspect he will have her stop that.  She also states she has been getting her narcotic pain medication at [facility] in [place] however it is not in the database.  She states the lidocaine notches are not working because her back hurts in such a large area.  7:09 AM I paged Dr. [name] again.  It is been 40 minutes since he was first paged.  7:20 AM Dr. Eulis Foster will talk to Dr. [name] when he returns the call.  Final Clinical Impressions(s) / ED Diagnoses   Final diagnoses:  Chest pain, unspecified type  Right upper quadrant abdominal pain    Nausea  Acute midline thoracic back pain  Abnormal liver enzymes  Lactic acidosis  Uncontrolled pain  Metastatic breast cancer ([place])    Disposition pending  Rolland Porter, MD, Barbette Or, MD 09/07/18 [phone]

## 2018-09-07 NOTE — ED Notes (Signed)
Pt updated on plan of care,  

## 2018-09-07 NOTE — Discharge Instructions (Addendum)
Follow-up with your oncologist as scheduled.  See your PCP if needed.  Stop taking the Verzenio

## 2018-09-09 ENCOUNTER — Observation Stay (HOSPITAL_COMMUNITY)
Admission: EM | Admit: 2018-09-09 | Discharge: 2018-09-10 | Disposition: A | Discharge status: Home / Self Care | Payer: 59 | Attending: Internal Medicine | Admitting: Internal Medicine

## 2018-09-09 ENCOUNTER — Encounter (HOSPITAL_COMMUNITY): Payer: Self-pay | Admitting: Emergency Medicine

## 2018-09-09 ENCOUNTER — Other Ambulatory Visit: Payer: Self-pay

## 2018-09-09 ENCOUNTER — Other Ambulatory Visit (HOSPITAL_COMMUNITY): Payer: Self-pay

## 2018-09-09 DIAGNOSIS — R109 Unspecified abdominal pain: Principal | ICD-10-CM | POA: Insufficient documentation

## 2018-09-09 DIAGNOSIS — R7989 Other specified abnormal findings of blood chemistry: Secondary | ICD-10-CM

## 2018-09-09 DIAGNOSIS — C7981 Secondary malignant neoplasm of breast: Secondary | ICD-10-CM | POA: Insufficient documentation

## 2018-09-09 DIAGNOSIS — R945 Abnormal results of liver function studies: Secondary | ICD-10-CM

## 2018-09-09 DIAGNOSIS — C787 Secondary malignant neoplasm of liver and intrahepatic bile duct: Secondary | ICD-10-CM | POA: Diagnosis not present

## 2018-09-09 DIAGNOSIS — Z79899 Other long term (current) drug therapy: Secondary | ICD-10-CM | POA: Diagnosis not present

## 2018-09-09 DIAGNOSIS — R740 Nonspecific elevation of levels of transaminase and lactic acid dehydrogenase [LDH]: Secondary | ICD-10-CM | POA: Diagnosis not present

## 2018-09-09 DIAGNOSIS — R112 Nausea with vomiting, unspecified: Secondary | ICD-10-CM | POA: Diagnosis not present

## 2018-09-09 DIAGNOSIS — Z853 Personal history of malignant neoplasm of breast: Secondary | ICD-10-CM | POA: Diagnosis not present

## 2018-09-09 DIAGNOSIS — C799 Secondary malignant neoplasm of unspecified site: Secondary | ICD-10-CM

## 2018-09-09 DIAGNOSIS — J45909 Unspecified asthma, uncomplicated: Secondary | ICD-10-CM | POA: Diagnosis not present

## 2018-09-09 DIAGNOSIS — F329 Major depressive disorder, single episode, unspecified: Secondary | ICD-10-CM | POA: Diagnosis not present

## 2018-09-09 DIAGNOSIS — R1084 Generalized abdominal pain: Secondary | ICD-10-CM | POA: Diagnosis not present

## 2018-09-09 DIAGNOSIS — G43909 Migraine, unspecified, not intractable, without status migrainosus: Secondary | ICD-10-CM | POA: Diagnosis not present

## 2018-09-09 LAB — CBC WITH DIFFERENTIAL/PLATELET
Abs Immature Granulocytes: 0.03 10*3/uL (ref 0.00–0.07)
Basophils Absolute: 0.1 10*3/uL (ref 0.0–0.1)
Basophils Relative: 2 %
Eosinophils Absolute: 0.1 10*3/uL (ref 0.0–0.5)
Eosinophils Relative: 2 %
HCT: 32.1 % — ABNORMAL LOW (ref 36.0–46.0)
Hemoglobin: 11 g/dL — ABNORMAL LOW (ref 12.0–15.0)
Immature Granulocytes: 1 %
Lymphocytes Relative: 11 %
Lymphs Abs: 0.7 10*3/uL (ref 0.7–4.0)
MCH: 30.1 pg (ref 26.0–34.0)
MCHC: 34.3 g/dL (ref 30.0–36.0)
MCV: 87.7 fL (ref 80.0–100.0)
Monocytes Absolute: 0.4 10*3/uL (ref 0.1–1.0)
Monocytes Relative: 6 %
Neutro Abs: 5.1 10*3/uL (ref 1.7–7.7)
Neutrophils Relative %: 78 %
Platelets: 170 10*3/uL (ref 150–400)
RBC: 3.66 MIL/uL — ABNORMAL LOW (ref 3.87–5.11)
RDW: 15.6 % — ABNORMAL HIGH (ref 11.5–15.5)
WBC: 6.5 10*3/uL (ref 4.0–10.5)
nRBC: 0 % (ref 0.0–0.2)

## 2018-09-09 LAB — COMPREHENSIVE METABOLIC PANEL
ALT: 82 U/L — ABNORMAL HIGH (ref 0–44)
AST: 302 U/L — ABNORMAL HIGH (ref 15–41)
Albumin: 3.5 g/dL (ref 3.5–5.0)
Alkaline Phosphatase: 129 U/L — ABNORMAL HIGH (ref 38–126)
Anion gap: 13 (ref 5–15)
BUN: 16 mg/dL (ref 6–20)
CO2: 22 mmol/L (ref 22–32)
Calcium: 9.8 mg/dL (ref 8.9–10.3)
Chloride: 103 mmol/L (ref 98–111)
Creatinine, Ser: 0.93 mg/dL (ref 0.44–1.00)
GFR calc Af Amer: >60 mL/min (ref >60)
GFR calc non Af Amer: >60 mL/min (ref >60)
Glucose, Bld: 97 mg/dL (ref 70–99)
Potassium: 3.5 mmol/L (ref 3.5–5.1)
Sodium: 138 mmol/L (ref 135–145)
Total Bilirubin: 5 mg/dL — ABNORMAL HIGH (ref 0.3–1.2)
Total Protein: 8.3 g/dL — ABNORMAL HIGH (ref 6.5–8.1)

## 2018-09-09 LAB — URINALYSIS, ROUTINE W REFLEX MICROSCOPIC
Glucose, UA: NEGATIVE mg/dL
Hgb urine dipstick: NEGATIVE
Ketones, ur: 5 mg/dL — AB
Leukocytes,Ua: NEGATIVE
Nitrite: NEGATIVE
Protein, ur: 100 mg/dL — AB
Specific Gravity, Urine: 1.017 (ref 1.005–1.030)
pH: 5 (ref 5.0–8.0)

## 2018-09-09 MED ORDER — MORPHINE SULFATE (PF) 4 MG/ML IV SOLN
4.0000 mg | Freq: Once | INTRAVENOUS | Status: AC
  Administered 2018-09-09: 4 mg via INTRAVENOUS
  Filled 2018-09-09: qty 1

## 2018-09-09 MED ORDER — HYDROMORPHONE HCL 1 MG/ML IJ SOLN
0.5000 mg | Freq: Once | INTRAMUSCULAR | Status: AC
  Administered 2018-09-09: 19:00:00 0.5 mg via INTRAVENOUS
  Filled 2018-09-09: qty 1

## 2018-09-09 MED ORDER — HALOPERIDOL LACTATE 5 MG/ML IJ SOLN
2.0000 mg | Freq: Once | INTRAMUSCULAR | Status: AC
  Administered 2018-09-09: 2 mg via INTRAVENOUS
  Filled 2018-09-09: qty 1

## 2018-09-09 MED ORDER — SODIUM CHLORIDE 0.9 % IV BOLUS
1000.0000 mL | Freq: Once | INTRAVENOUS | Status: AC
  Administered 2018-09-09: 1000 mL via INTRAVENOUS

## 2018-09-09 NOTE — ED Triage Notes (Signed)
Pt states that she has mbc that has spread to her liver she is having abd pain.

## 2018-09-09 NOTE — H&P (Signed)
TRH H&P    Patient Demographics:    Pamela Long, is a 48 y.o. female  MRN: [id number]  DOB - November 29, 1970  Admit Date - 09/09/2018  Referring MD/NP/PA: Dr Wilson Singer  Outpatient Primary MD for the patient is Doree Albee, MD  Patient coming from: Home  Chief complaint- Abdominal pain   HPI:    Pamela Long  is a 48 y.o. female, with recent diagnosis of breast cancer with metastases to liver and possibly 2 areas of thoracic spine T5 and T8 vertebral bodies.  Came to hospital with complaints of abdominal pain and shortness of breath with exertion.  Patient was seen in the ED yesterday for similar complaints, CTAof the chest and CT abdomen was done which was negative for pulmonary embolism.  CT abdomen showed extensive malignancy in the liver, new satellite nodules when compared with 06/17/2018 abdominal CT. ED physician Dr. Eulis Foster discussed with patient's oncologist Dr  Debe Coder, who recommended to stop Verzenio. Patient was discharged home.  Today she came back with same symptoms with unbearable abdominal pain and nausea.  Denies vomiting.  Denies diarrhea at this time. In the ED patient was given Dilaudid and morphine for pain control. Denies fever, chills Denies diarrhea, last BM was on Sunday Denies vomiting Denies shortness of breath, but says she gets short winded on exertion Denies dysuria   Review of systems:    In addition to the HPI above,   All other systems reviewed and are negative.    Past History of the following :    Past Medical History:  Diagnosis Date  . Anemia   . Anxiety   . Asthma   . Breast cancer ([place])    Left, 2017  . Depression   . GERD (gastroesophageal reflux disease)   . Migraine   . OAB (overactive bladder)   . Seasonal allergies       Past Surgical History:  Procedure Laterality Date  . ABDOMINAL HYSTERECTOMY     breast cancer  . ANKLE RECONSTRUCTION  Right 1989  . BREAST SURGERY    . COLONOSCOPY WITH PROPOFOL N/A 08/06/2018   Procedure: COLONOSCOPY WITH PROPOFOL;  Surgeon: Danie Binder, MD;  Location: [facility];  Service: Endoscopy;  Laterality: N/A;  1:30pm  . FOREIGN BODY REMOVAL N/A 08/29/2017   from lip  . KNEE SURGERY Right 1989   bone spur  . MASTECTOMY  2017   bil mastectomies  . SINUSOTOMY        Social History:      Social History   Tobacco Use  . Smoking status: Never Smoker  . Smokeless tobacco: Never Used  Substance Use Topics  . Alcohol use: No       Family History :     Family History  Problem Relation Age of Onset  . Arthritis Mother   . COPD Mother   . Depression Mother   . Diabetes Mother   . Kidney disease Father   . Heart disease Father 85  . Drug abuse Father   . Hypertension Father   .  Diabetes Daughter   . Hashimoto's thyroiditis Daughter   . Irritable bowel syndrome Daughter   . Autism spectrum disorder Daughter   . Early death Maternal Grandmother        drowned  . Early death Maternal Grandfather   . Heart disease Maternal Grandfather   . Heart disease Paternal Grandfather   . Breast cancer Maternal Aunt   . Breast cancer Cousin   . Lung cancer Maternal Aunt   . Lung cancer Maternal Uncle   . Colon cancer Neg Hx       Home Medications:   Prior to Admission medications   Medication Sig Start Date End Date Taking? Authorizing Provider  abemaciclib (VERZENIO) 150 MG tablet Take 1 tablet (150 mg total) by mouth 2 (two) times daily. Swallow tablets whole. Do not chew, crush, or split tablets before swallowing. 08/26/18   Derek Jack, MD  Carboxymeth-Glyc-Polysorb PF (REFRESH OPTIVE MEGA-3) 0.5-1-0.5 % SOLN Apply 1 drop to eye 2 (two) times daily.    [provider]  Carboxymeth-Glyc-Polysorb PF (REFRESH OPTIVE MEGA-3) 0.5-1-0.5 % SOLN Apply to eye.    [provider]  Cholecalciferol (VITAMIN D3 PO) Take 5 each by mouth daily.     [provider]  escitalopram (LEXAPRO) 20 MG tablet Take 1 tablet (20 mg total) by mouth daily. 02/12/17   Raylene Everts, MD  fulvestrant (FASLODEX) 250 MG/5ML injection Inject 250 mg into the muscle every 14 (fourteen) days. One injection each buttock over 1-2 minutes. Warm prior to use.    [provider]  gabapentin (NEURONTIN) 300 MG capsule Take 600 mg by mouth at bedtime. Per patient 600 mg at bedtime 03/04/18   [provider]  lidocaine (LIDODERM) 5 % Place 1 patch onto the skin daily. Remove & Discard patch within 12 hours or as directed by MD 09/01/18   Derek Jack, MD  loperamide (IMODIUM) 2 MG capsule Take 2-4 mg by mouth daily as needed for diarrhea or loose stools.    [provider]  naproxen sodium (ALEVE) 220 MG tablet Take 220-440 mg by mouth 2 (two) times daily as needed (for pain or headache).     [provider]  ondansetron (ZOFRAN) 4 MG tablet Take 1 tablet (4 mg total) by mouth every 8 (eight) hours as needed for nausea or vomiting. 06/17/18   Hayden Rasmussen, MD  oxyCODONE (OXY IR/ROXICODONE) 5 MG immediate release tablet Take 1 tablet (5 mg total) by mouth every 8 (eight) hours as needed for severe pain. Patient taking differently: Take 5 mg by mouth at bedtime. Per pt she takes 5 mg at bedtime 07/16/18   Lockamy, Randi L, NP-C  pantoprazole (PROTONIX) 40 MG tablet TAKE 1 TABLET BY MOUTH DAILY BEFORE BREAKFAST Patient taking differently: Take 40 mg by mouth daily before breakfast.  06/26/18   Annitta Needs, NP  Polyvinyl Alcohol-Povidone PF (REFRESH) 1.4-0.6 % SOLN Place 1 drop into both eyes daily as needed (for dry eyes).    [provider]  rizatriptan (MAXALT-MLT) 10 MG disintegrating tablet Take 10 mg by mouth every 2 (two) hours as needed for migraine.     [provider]  traMADol (ULTRAM) 50 MG tablet Take 1 tablet (50 mg total) by mouth 2 (two) times daily. 08/19/18   Derek Jack, MD  TROKENDI XR 50  MG CP24 Take 50 mg by mouth 2 (two) times daily.    [provider]     Allergies:  Allergies  Allergen Reactions  . Corn-Containing Products Other (See Comments)    Headache and GI upset  . Tape Itching and Other (See Comments)    Depending on the adhesive-blistering occurs  . Amoxicillin Hives and Other (See Comments)    Rash only DID THE REACTION INVOLVE: Swelling of the face/tongue/throat, SOB, or low BP? Sudden or severe rash/hives, skin peeling, or the inside of the mouth or nose?  Did it require medical treatment?  When did it last happen? If all above answers are "NO", may proceed with cephalosporin use.   . Caffeine Diarrhea, Nausea Only, Palpitations and Other (See Comments)    Headache  . Tetanus Toxoids Swelling and Other (See Comments)    Local reaction     Physical Exam:   Vitals  Blood pressure 134/82, pulse 95, temperature 98.2 F (36.8 C), temperature source Temporal, resp. rate 15, height 5\' 5"  (1.651 m), weight 88.5 kg, SpO2 94 %.  1.  General: Appears in no acute distress  2. Psychiatric: Alert, oriented x 3  3. Neurologic: Cranial nerves II through grossly intact, motor strength 5/5 in all extremities  4. HEENMT:  Atraumatic normocephalic, extraocular muscles are intact, oral mucosa is pink and moist.  5. Respiratory : Clear to auscultation bilaterally  6. Cardiovascular : Abdomen is soft, nontender, no organomegaly  7. Gastrointestinal:  Abdomen is soft, distended, mild generalized tenderness to palpation.     Data Review:    CBC Recent Labs  Lab 09/07/18 0355 09/09/18 1820  WBC 7.4 6.5  HGB 11.5* 11.0*  HCT 34.0* 32.1*  PLT 156 170  MCV 89.0 87.7  MCH 30.1 30.1  MCHC 33.8 34.3  RDW 15.3 15.6*  LYMPHSABS 1.1 0.7  MONOABS 0.4 0.4  EOSABS 0.1 0.1  BASOSABS 0.1 0.1   ------------------------------------------------------------------------------------------------------------------  Results for orders  placed or performed during the hospital encounter of 09/09/18 (from the past 48 hour(s))  Urinalysis, Routine w reflex microscopic     Status: Abnormal   Collection Time: 09/09/18  5:49 PM  Result Value Ref Range   Color, Urine AMBER (A) YELLOW    Comment: BIOCHEMICALS MAY BE AFFECTED BY COLOR   APPearance HAZY (A) CLEAR   Specific Gravity, Urine 1.017 1.005 - 1.030   pH 5.0 5.0 - 8.0   Glucose, UA NEGATIVE NEGATIVE mg/dL   Hgb urine dipstick NEGATIVE NEGATIVE   Bilirubin Urine SMALL (A) NEGATIVE   Ketones, ur 5 (A) NEGATIVE mg/dL   Protein, ur 100 (A) NEGATIVE mg/dL   Nitrite NEGATIVE NEGATIVE   Leukocytes,Ua NEGATIVE NEGATIVE   RBC / HPF 0-5 0 - 5 RBC/hpf   WBC, UA 0-5 0 - 5 WBC/hpf   Bacteria, UA RARE (A) NONE SEEN   Squamous Epithelial / LPF 0-5 0 - 5   Mucus PRESENT    Hyaline Casts, UA PRESENT     Comment: Performed at [facility], [address], [place], [place] [address]  Comprehensive metabolic panel     Status: Abnormal   Collection Time: 09/09/18  6:20 PM  Result Value Ref Range   Sodium 138 135 - 145 mmol/L   Potassium 3.5 3.5 - 5.1 mmol/L   Chloride 103 98 - 111 mmol/L   CO2 22 22 - 32 mmol/L   Glucose, Bld 97 70 - 99 mg/dL   BUN 16 6 - 20 mg/dL   Creatinine, Ser 0.93 0.44 - 1.00 mg/dL   Calcium 9.8 8.9 - 10.3 mg/dL   Total Protein 8.3 (H) 6.5 -  8.1 g/dL   Albumin 3.5 3.5 - 5.0 g/dL   AST 302 (H) 15 - 41 U/L   ALT 82 (H) 0 - 44 U/L   Alkaline Phosphatase 129 (H) 38 - 126 U/L   Total Bilirubin 5.0 (H) 0.3 - 1.2 mg/dL   GFR calc non Af Amer >60 >60 mL/min   GFR calc Af Amer >60 >60 mL/min   Anion gap 13 5 - 15    Comment: Performed at [facility], [address] [place], [place] [address]  CBC with Differential     Status: Abnormal   Collection Time: 09/09/18  6:20 PM  Result Value Ref Range   WBC 6.5 4.0 - 10.5 K/uL   RBC 3.66 (L) 3.87 - 5.11 MIL/uL   Hemoglobin 11.0 (L) 12.0 - 15.0 g/dL   HCT 32.1 (L) 36.0 - 46.0 %   MCV 87.7 80.0 - 100.0 fL   MCH  30.1 26.0 - 34.0 pg   MCHC 34.3 30.0 - 36.0 g/dL   RDW 15.6 (H) 11.5 - 15.5 %   Platelets 170 150 - 400 K/uL   nRBC 0.0 0.0 - 0.2 %   Neutrophils Relative % 78 %   Neutro Abs 5.1 1.7 - 7.7 K/uL   Lymphocytes Relative 11 %   Lymphs Abs 0.7 0.7 - 4.0 K/uL   Monocytes Relative 6 %   Monocytes Absolute 0.4 0.1 - 1.0 K/uL   Eosinophils Relative 2 %   Eosinophils Absolute 0.1 0.0 - 0.5 K/uL   Basophils Relative 2 %   Basophils Absolute 0.1 0.0 - 0.1 K/uL   Immature Granulocytes 1 %   Abs Immature Granulocytes 0.03 0.00 - 0.07 K/uL    Comment: Performed at [facility], [address] [place], [place] [address]    Chemistries  Recent Labs  Lab 09/07/18 0355 09/09/18 1820  NA 137 138  K 3.7 3.5  CL 103 103  CO2 21* 22  GLUCOSE 83 97  BUN 20 16  CREATININE 1.11* 0.93  CALCIUM 10.0 9.8  AST 290* 302*  ALT 72* 82*  ALKPHOS 110 129*  BILITOT 4.0* 5.0*   ------------------------------------------------------------------------------------------------------------------  ------------------------------------------------------------------------------------------------------------------ GFR: Estimated Creatinine Clearance: 80.4 mL/min (by C-G formula based on SCr of 0.93 mg/dL). Liver Function Tests: Recent Labs  Lab 09/07/18 0355 09/09/18 1820  AST 290* 302*  ALT 72* 82*  ALKPHOS 110 129*  BILITOT 4.0* 5.0*  PROT 8.3* 8.3*  ALBUMIN 3.6 3.5   Recent Labs  Lab 09/07/18 0355  LIPASE 19   No results for input(s): AMMONIA in the last 168 hours. Coagulation Profile: No results for input(s): INR, PROTIME in the last 168 hours. Cardiac Enzymes: Recent Labs  Lab 09/07/18 0355 09/07/18 0716  TROPONINI <0.03 <0.03    --------------------------------------------------------------------------------------------------------------- Urine analysis:    Component Value Date/Time   COLORURINE AMBER (A) 09/09/2018 1749   APPEARANCEUR HAZY (A) 09/09/2018 1749   LABSPEC 1.017  09/09/2018 1749   PHURINE 5.0 09/09/2018 1749   GLUCOSEU NEGATIVE 09/09/2018 1749   HGBUR NEGATIVE 09/09/2018 1749   BILIRUBINUR SMALL (A) 09/09/2018 1749   KETONESUR 5 (A) 09/09/2018 1749   PROTEINUR 100 (A) 09/09/2018 1749   NITRITE NEGATIVE 09/09/2018 1749   LEUKOCYTESUR NEGATIVE 09/09/2018 1749      Imaging Results:      Assessment & Plan:    Active Problems:   Abdominal pain   1. Abdominal pain-likely from liver metastasis, she does have elevated liver enzymes which are mildly elevated as compared to  yesterday.  Will place under observation for pain management with Dilaudid 1 mg IV every 3 hours PRN.  Follow liver enzymes in a.m.  Lipase was 19 yesterday.  2. Nausea-patient denies vomiting, continue Zofran PRN for nausea and vomiting.  3. History of breast cancer-patient is followed by oncology, she is status post bilateral mastectomy.  Will hold Verzenio, as per her oncologist recommendation after he discussed with the ED physician yesterday.  As this may be contributing to patient's symptoms of nausea and abdominal pain.  4. Transaminitis-patient has elevated liver enzymes from liver metastasis.  Follow LFTs in a.m.    DVT Prophylaxis-   Lovenox   AM Labs Ordered, also please review Full Orders  Family Communication: Admission, patients condition and plan of care including tests being ordered have been discussed with the patient  who indicate understanding and agree with the plan and Code Status.  Code Status:  Full code  Admission status: Observation: Based on patients clinical presentation and evaluation of above clinical data, I have made determination that patient meets Inpatient criteria at this time.  Time spent in minutes : 60 min   Oswald Hillock M.D on 09/09/2018 at 8:49 PM

## 2018-09-10 DIAGNOSIS — G43909 Migraine, unspecified, not intractable, without status migrainosus: Secondary | ICD-10-CM | POA: Diagnosis not present

## 2018-09-10 DIAGNOSIS — K219 Gastro-esophageal reflux disease without esophagitis: Secondary | ICD-10-CM

## 2018-09-10 DIAGNOSIS — C50919 Malignant neoplasm of unspecified site of unspecified female breast: Secondary | ICD-10-CM | POA: Diagnosis not present

## 2018-09-10 DIAGNOSIS — R1084 Generalized abdominal pain: Secondary | ICD-10-CM | POA: Diagnosis not present

## 2018-09-10 LAB — CBC
HCT: 30.6 % — ABNORMAL LOW (ref 36.0–46.0)
Hemoglobin: 9.9 g/dL — ABNORMAL LOW (ref 12.0–15.0)
MCH: 30 pg (ref 26.0–34.0)
MCHC: 32.4 g/dL (ref 30.0–36.0)
MCV: 92.7 fL (ref 80.0–100.0)
Platelets: 146 10*3/uL — ABNORMAL LOW (ref 150–400)
RBC: 3.3 MIL/uL — ABNORMAL LOW (ref 3.87–5.11)
RDW: 15.9 % — ABNORMAL HIGH (ref 11.5–15.5)
WBC: 6.3 10*3/uL (ref 4.0–10.5)
nRBC: 0 % (ref 0.0–0.2)

## 2018-09-10 LAB — COMPREHENSIVE METABOLIC PANEL
ALT: 75 U/L — ABNORMAL HIGH (ref 0–44)
AST: 287 U/L — ABNORMAL HIGH (ref 15–41)
Albumin: 3.1 g/dL — ABNORMAL LOW (ref 3.5–5.0)
Alkaline Phosphatase: 116 U/L (ref 38–126)
Anion gap: 13 (ref 5–15)
BUN: 15 mg/dL (ref 6–20)
CO2: 21 mmol/L — ABNORMAL LOW (ref 22–32)
Calcium: 9.4 mg/dL (ref 8.9–10.3)
Chloride: 106 mmol/L (ref 98–111)
Creatinine, Ser: 0.87 mg/dL (ref 0.44–1.00)
GFR calc Af Amer: >60 mL/min (ref >60)
GFR calc non Af Amer: >60 mL/min (ref >60)
Glucose, Bld: 89 mg/dL (ref 70–99)
Potassium: 3.5 mmol/L (ref 3.5–5.1)
Sodium: 140 mmol/L (ref 135–145)
Total Bilirubin: 4.7 mg/dL — ABNORMAL HIGH (ref 0.3–1.2)
Total Protein: 7.4 g/dL (ref 6.5–8.1)

## 2018-09-10 MED ORDER — ONDANSETRON HCL 4 MG/2ML IJ SOLN
4.0000 mg | Freq: Four times a day (QID) | INTRAMUSCULAR | Status: DC | PRN
  Administered 2018-09-10: 4 mg via INTRAVENOUS
  Filled 2018-09-10: qty 2

## 2018-09-10 MED ORDER — GABAPENTIN 300 MG PO CAPS
600.0000 mg | ORAL_CAPSULE | Freq: Every day | ORAL | Status: DC
  Administered 2018-09-10: 01:00:00 600 mg via ORAL
  Filled 2018-09-10: qty 2

## 2018-09-10 MED ORDER — ACETAMINOPHEN 325 MG PO TABS: 650.0000 mg | ORAL_TABLET | Freq: Four times a day (QID) | ORAL | Status: DC | PRN

## 2018-09-10 MED ORDER — ONDANSETRON 8 MG PO TBDP: 8.0000 mg | ORAL_TABLET | Freq: Three times a day (TID) | ORAL | 0 refills | Status: DC | PRN

## 2018-09-10 MED ORDER — ONDANSETRON HCL 4 MG PO TABS: 4.0000 mg | ORAL_TABLET | Freq: Four times a day (QID) | ORAL | Status: DC | PRN

## 2018-09-10 MED ORDER — ESCITALOPRAM OXALATE 10 MG PO TABS
20.0000 mg | ORAL_TABLET | Freq: Every day | ORAL | Status: DC
  Administered 2018-09-10: 20 mg via ORAL
  Filled 2018-09-10: qty 2

## 2018-09-10 MED ORDER — ACETAMINOPHEN 650 MG RE SUPP: 650.0000 mg | Freq: Four times a day (QID) | RECTAL | Status: DC | PRN

## 2018-09-10 MED ORDER — SODIUM CHLORIDE 0.9 % IV SOLN
INTRAVENOUS | Status: DC
  Administered 2018-09-10 (×2): via INTRAVENOUS

## 2018-09-10 MED ORDER — PANTOPRAZOLE SODIUM 40 MG IV SOLR
40.0000 mg | INTRAVENOUS | Status: DC
  Administered 2018-09-10: 40 mg via INTRAVENOUS
  Filled 2018-09-10: qty 40

## 2018-09-10 MED ORDER — RIZATRIPTAN BENZOATE 10 MG PO TABS
10.0000 mg | ORAL_TABLET | ORAL | Status: DC | PRN
  Administered 2018-09-10: 10 mg via ORAL
  Filled 2018-09-10: qty 2

## 2018-09-10 MED ORDER — NON FORMULARY: 10.0000 mg | Freq: Once | Status: DC

## 2018-09-10 MED ORDER — HYDROMORPHONE HCL 1 MG/ML IJ SOLN
1.0000 mg | INTRAMUSCULAR | Status: DC | PRN
  Administered 2018-09-10 (×2): 1 mg via INTRAVENOUS
  Filled 2018-09-10 (×2): qty 1

## 2018-09-10 MED ORDER — TOPIRAMATE ER 25 MG PO SPRINKLE CAP24
50.0000 mg | EXTENDED_RELEASE_CAPSULE | Freq: Two times a day (BID) | ORAL | Status: DC
  Filled 2018-09-10 (×3): qty 2

## 2018-09-10 MED ORDER — HYDROMORPHONE HCL 2 MG PO TABS: 2.0000 mg | ORAL_TABLET | Freq: Four times a day (QID) | ORAL | 0 refills | Status: DC | PRN

## 2018-09-10 MED ORDER — ENOXAPARIN SODIUM 40 MG/0.4ML ~~LOC~~ SOLN
40.0000 mg | SUBCUTANEOUS | Status: DC
  Administered 2018-09-10: 40 mg via SUBCUTANEOUS
  Filled 2018-09-10: qty 0.4

## 2018-09-10 MED ORDER — DOCUSATE SODIUM 100 MG PO CAPS: 100.0000 mg | ORAL_CAPSULE | Freq: Two times a day (BID) | ORAL | 2 refills | Status: DC | PRN

## 2018-09-10 NOTE — Discharge Summary (Signed)
Physician Discharge Summary  Pamela Long XTK:[id number] DOB: 09-Sep-1970 DOA: 09/09/2018  PCP: Doree Albee, MD  Admit date: 09/09/2018 Discharge date: 09/10/2018  Time spent: 30 minutes  Recommendations for Outpatient Follow-up:  1. Repeat basic metabolic panel to follow electrolytes and renal function 2. Reassess pain control and further adjust analgesic regimen as needed.   Discharge Diagnoses:  Abdominal pain Nausea/vomiting Metastatic breast cancer Gastroesophageal foot disease Depression Transaminitis Migraines  Discharge Condition: Stable and improved.  Patient discharged home with instruction to follow-up with oncology service and primary care doctor as an outpatient.  Diet recommendation: Regular diet  Filed Weights   09/09/18 1705 09/09/18 2326  Weight: 88.5 kg 87.9 kg    History of present illness:  As per H&P written by Dr. Darrick Meigs on 09/09/2018 48 y.o. female, with recent diagnosis of breast cancer with metastases to liver and possibly 2 areas of thoracic spine T5 and T8 vertebral bodies.  Came to hospital with complaints of abdominal pain and shortness of breath with exertion.  Patient was seen in the ED yesterday for similar complaints, CTAof the chest and CT abdomen was done which was negative for pulmonary embolism.  CT abdomen showed extensive malignancy in the liver, new satellite nodules when compared with 06/17/2018 abdominal CT. ED physician Dr. Eulis Foster discussed with patient's oncologist Dr  Debe Coder, who recommended to stop Verzenio. Patient was discharged home.  Today she came back with same symptoms with unbearable abdominal pain and nausea.  Denies vomiting.  Denies diarrhea at this time. In the ED patient was given Dilaudid and morphine for pain control. Denies fever, chills Denies diarrhea, last BM was on Sunday Denies vomiting Denies shortness of breath, but says she gets short winded on exertion Denies dysuria  Hospital Course:  1-abdominal  pain, nausea/vomiting: -Most likely associated with metastatic liver disease -No obstructions appreciated on CT abdomen -Positive extensive malignancy in the liver with new satellite nodules when compared to abdominal CT in January 2020. -Symptoms improve with the use of Dilaudid and Zofran -Patient able to keep diet down and was discharged home with instruction to follow-up with oncology service. -continue falsodex   2-GERD:  -Continue PPI  3-depression/anxiety -Stable mood -No suicidal ideation or hallucinations -Continue lexapro  4-transaminitis -Improvement with fluid resuscitation -Most likely associated with liver metastases  -Repeat LFTs at follow-up visit.  5-history of metastatic breast cancer -Status post bilateral mastectomy -Continue patient follow-up with oncology service -Verzenio discontinued as recommended by oncology.  6-migraines -continue PNR sumatriptan -continue trokendi   Procedures:  See below for x-ray reports.  Consultations:  None   Discharge Exam: Vitals:   09/09/18 2326 09/10/18 0556  BP: 126/89 (!) 135/92  Pulse: 86 80  Resp: 16 16  Temp: 98.4 F (36.9 C) 98 F (36.7 C)  SpO2: 97% 95%    General: afebrile, no CP, no SOB. Reported improvement in her abd discomfort, no nausea, no vomiting. Cardiovascular: S1 and S2, no rubs, no gallops Respiratory: CTA bilaterally. Abd: soft, positive BS, mild tenderness on deep palpation  Extremities: no edema, no cyanosis, no clubbing   Discharge Instructions   Discharge Instructions    Discharge instructions   Complete by:  As directed    Take medications as prescribed Follow-up with oncology service as instructed Maintain adequate hydration     Allergies as of 09/10/2018      Reactions   Corn-containing Products Other (See Comments)   Headache and GI upset   Tape Itching, Other (See Comments)  Depending on the adhesive-blistering occurs   Amoxicillin Hives, Other (See Comments)    Rash only DID THE REACTION INVOLVE: Swelling of the face/tongue/throat, SOB, or low BP? Sudden or severe rash/hives, skin peeling, or the inside of the mouth or nose?  Did it require medical treatment?  When did it last happen? If all above answers are "NO", may proceed with cephalosporin use.   Caffeine Diarrhea, Nausea Only, Palpitations, Other (See Comments)   Headache   Tetanus Toxoids Swelling, Other (See Comments)   Local reaction      Medication List    STOP taking these medications   ondansetron 4 MG tablet Commonly known as:  ZOFRAN   oxyCODONE 5 MG immediate release tablet Commonly known as:  Oxy IR/ROXICODONE     TAKE these medications   docusate sodium 100 MG capsule Commonly known as:  Colace Take 1 capsule (100 mg total) by mouth 2 (two) times daily as needed for mild constipation.   escitalopram 20 MG tablet Commonly known as:  LEXAPRO Take 1 tablet (20 mg total) by mouth daily.   fulvestrant 250 MG/5ML injection Commonly known as:  FASLODEX Inject 250 mg into the muscle every 30 (thirty) days. One injection each buttock over 1-2 minutes. Warm prior to use.   gabapentin 300 MG capsule Commonly known as:  NEURONTIN Take 600 mg by mouth at bedtime.   HYDROmorphone 2 MG tablet Commonly known as:  Dilaudid Take 1-2 tablets (2-4 mg total) by mouth every 6 (six) hours as needed for severe pain.   lidocaine 5 % Commonly known as:  LIDODERM Place 1 patch onto the skin daily. Remove & Discard patch within 12 hours or as directed by MD   naproxen sodium 220 MG tablet Commonly known as:  ALEVE Take 220-440 mg by mouth 2 (two) times daily as needed (for pain or headache).   ondansetron 8 MG disintegrating tablet Commonly known as:  Zofran ODT Take 1 tablet (8 mg total) by mouth every 8 (eight) hours as needed for nausea or vomiting.   pantoprazole 40 MG tablet Commonly known as:  PROTONIX TAKE 1 TABLET BY MOUTH DAILY BEFORE BREAKFAST What changed:   See the new instructions.   Refresh 1.4-0.6 % Soln Generic drug:  Polyvinyl Alcohol-Povidone PF Place 1 drop into both eyes 2 (two) times daily.   rizatriptan 10 MG disintegrating tablet Commonly known as:  MAXALT-MLT Take 10 mg by mouth every 2 (two) hours as needed for migraine.   traMADol 50 MG tablet Commonly known as:  ULTRAM Take 1 tablet (50 mg total) by mouth 2 (two) times daily.   Trokendi XR 50 MG Cp24 Generic drug:  Topiramate ER Take 50 mg by mouth 2 (two) times daily.   Vitamin D3 Gummies 25 MCG (1000 UT) Chew Generic drug:  Cholecalciferol Chew 2,000-4,000 Units by mouth daily.      Allergies  Allergen Reactions  . Corn-Containing Products Other (See Comments)    Headache and GI upset  . Tape Itching and Other (See Comments)    Depending on the adhesive-blistering occurs  . Amoxicillin Hives and Other (See Comments)    Rash only DID THE REACTION INVOLVE: Swelling of the face/tongue/throat, SOB, or low BP? Sudden or severe rash/hives, skin peeling, or the inside of the mouth or nose?  Did it require medical treatment?  When did it last happen? If all above answers are "NO", may proceed with cephalosporin use.   . Caffeine Diarrhea, Nausea Only, Palpitations and Other (  See Comments)    Headache  . Tetanus Toxoids Swelling and Other (See Comments)    Local reaction   Follow-up Information    Gosrani, Nimish C, MD. Schedule an appointment as soon as possible for a visit in 10 day(s).   Specialty:  Internal Medicine Contact information: [place] [address] [phone]        Satira Sark, MD .   Specialty:  Cardiology Contact information: [place] [address] [phone]        Derek Jack, MD. Go today.   Specialty:  Hematology Why:  as previously instructed Contact information: [address] [place] [address] [phone]           The results of significant diagnostics  from this hospitalization (including imaging, microbiology, ancillary and laboratory) are listed below for reference.    Significant Diagnostic Studies: Ct Angio Chest Pe W/cm &/or Wo Cm  Result Date: 09/07/2018 CLINICAL DATA:  Cancer patient with new onset chest pain EXAM: CT ANGIOGRAPHY CHEST CT ABDOMEN AND PELVIS WITH CONTRAST TECHNIQUE: Multidetector CT imaging of the chest was performed using the standard protocol during bolus administration of intravenous contrast. Multiplanar CT image reconstructions and MIPs were obtained to evaluate the vascular anatomy. Multidetector CT imaging of the abdomen and pelvis was performed using the standard protocol during bolus administration of intravenous contrast. CONTRAST:  133mL OMNIPAQUE IOHEXOL 350 MG/ML SOLN COMPARISON:  08/03/2018 chest CT.  Abdominal CT 06/17/2018 FINDINGS: CTA CHEST FINDINGS Cardiovascular: Satisfactory opacification of the pulmonary arteries to the segmental level. No evidence of pulmonary embolism. Normal heart size. No pericardial effusion. Mediastinum/Nodes: New prominence of of bilateral hilar lymph node size. Lungs/Pleura: There is no edema, consolidation, effusion, or pneumothorax. Musculoskeletal: Small spinal metastases by recent thoracic MRI. No acute osseous finding or destructive process. Other: Bilateral mastectomy Review of the MIP images confirms the above findings. CT ABDOMEN and PELVIS FINDINGS Hepatobiliary: Large necrotic mass involving most of the liver with small satellite nodules scattered throughout the remaining parenchyma. No evident rupture. There is mass effect on the IVC central portal venous system.The gallbladder is high-density and collapsed. Pancreas: Unremarkable. Spleen: Unremarkable. Adrenals/Urinary Tract: Negative adrenals. No hydronephrosis or stone. Unremarkable bladder. Stomach/Bowel:  No obstruction. No evidence of bowel inflammation. Vascular/Lymphatic: No acute vascular abnormality. No mass or  adenopathy. Reproductive:Hysterectomy. Other: No ascites or pneumoperitoneum. Musculoskeletal: No acute abnormalities. Review of the MIP images confirms the above findings. IMPRESSION: Chest CTA: 1. Negative for pulmonary embolism or other acute finding. 2. Prominent bilateral hilar lymph nodes size, attention on follow-up staging scans. Abdominal CT: 1. Extensive malignancy in the liver, known from recent imaging. There are new satellite nodules when compared to 06/17/2018 abdominal CT. 2. No superimposed acute finding. Electronically Signed   By: Monte Fantasia M.D.   On: 09/07/2018 06:11   Mr Thoracic Spine W Wo Contrast  Result Date: 08/31/2018 CLINICAL DATA:  48 year old female with metastatic breast cancer. Three weeks of low back pain. EXAM: MRI THORACIC WITHOUT AND WITH CONTRAST TECHNIQUE: Multiplanar and multiecho pulse sequences of the thoracic spine were obtained without and with intravenous contrast. CONTRAST:  10 milliliters Gadavist COMPARISON:  Chest CTA 08/03/2018.  PET-CT 07/13/2018. FINDINGS: Limited cervical spine imaging:  Negative. Thoracic spine segmentation:  Appears to be normal. Alignment:  Within normal limits. Vertebrae: Small round 6-7 millimeter T1 hypointense and enhancing lesion in the central T5 vertebral body most resembles a metastasis (series 8, image 7 and series 14, image  8. There is vague marrow heterogeneity with increased STIR signal and enhancement in the anterior T8 vertebral body (series 14, image 7 and series 9, image 7. Occasional benign hemangiomas (left T11 body on series 8, image 9. No other suspicious thoracic vertebral lesion. Degenerative endplate marrow signal changes inferiorly at T11. Negative visible upper lumbar vertebrae. Cord: Spinal cord signal is within normal limits at all visualized levels. No abnormal intradural enhancement. No dural thickening identified. Conus medullaris appears negative at L1. Paraspinal and other soft tissues: Negative visible  thoracic viscera. Negative visible kidneys and adrenal glands. Thoracic paraspinal soft tissues are within normal limits. Disc levels: Intermittent thoracic disc, endplate, and posterior element degeneration. There is borderline to mild degenerative thoracic spinal stenosis at T11-T12. No other thoracic spinal stenosis. Facet hypertrophy with up to mild bilateral T10 neural foraminal stenosis at T10-T11. IMPRESSION: 1. 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 No pathologic fracture or epidural extension. 2. Boderline to mild degenerative spinal stenosis at T11-T12. Electronically Signed   By: Genevie Ann M.D.   On: 08/31/2018 20:59   Ct Abdomen Pelvis W Contrast  Result Date: 09/07/2018 CLINICAL DATA:  Cancer patient with new onset chest pain EXAM: CT ANGIOGRAPHY CHEST CT ABDOMEN AND PELVIS WITH CONTRAST TECHNIQUE: Multidetector CT imaging of the chest was performed using the standard protocol during bolus administration of intravenous contrast. Multiplanar CT image reconstructions and MIPs were obtained to evaluate the vascular anatomy. Multidetector CT imaging of the abdomen and pelvis was performed using the standard protocol during bolus administration of intravenous contrast. CONTRAST:  161mL OMNIPAQUE IOHEXOL 350 MG/ML SOLN COMPARISON:  08/03/2018 chest CT.  Abdominal CT 06/17/2018 FINDINGS: CTA CHEST FINDINGS Cardiovascular: Satisfactory opacification of the pulmonary arteries to the segmental level. No evidence of pulmonary embolism. Normal heart size. No pericardial effusion. Mediastinum/Nodes: New prominence of of bilateral hilar lymph node size. Lungs/Pleura: There is no edema, consolidation, effusion, or pneumothorax. Musculoskeletal: Small spinal metastases by recent thoracic MRI. No acute osseous finding or destructive process. Other: Bilateral mastectomy Review of the MIP images confirms the above findings. CT ABDOMEN and PELVIS FINDINGS Hepatobiliary: Large necrotic mass involving  most of the liver with small satellite nodules scattered throughout the remaining parenchyma. No evident rupture. There is mass effect on the IVC central portal venous system.The gallbladder is high-density and collapsed. Pancreas: Unremarkable. Spleen: Unremarkable. Adrenals/Urinary Tract: Negative adrenals. No hydronephrosis or stone. Unremarkable bladder. Stomach/Bowel:  No obstruction. No evidence of bowel inflammation. Vascular/Lymphatic: No acute vascular abnormality. No mass or adenopathy. Reproductive:Hysterectomy. Other: No ascites or pneumoperitoneum. Musculoskeletal: No acute abnormalities. Review of the MIP images confirms the above findings. IMPRESSION: Chest CTA: 1. Negative for pulmonary embolism or other acute finding. 2. Prominent bilateral hilar lymph nodes size, attention on follow-up staging scans. Abdominal CT: 1. Extensive malignancy in the liver, known from recent imaging. There are new satellite nodules when compared to 06/17/2018 abdominal CT. 2. No superimposed acute finding. Electronically Signed   By: Monte Fantasia M.D.   On: 09/07/2018 06:11   Labs: Basic Metabolic Panel: Recent Labs  Lab 09/07/18 0355 09/09/18 1820 09/10/18 0427  NA 137 138 140  K 3.7 3.5 3.5  CL 103 103 106  CO2 21* 22 21*  GLUCOSE 83 97 89  BUN 20 16 15   CREATININE 1.11* 0.93 0.87  CALCIUM 10.0 9.8 9.4   Liver Function Tests: Recent Labs  Lab 09/07/18 0355 09/09/18 1820 09/10/18 0427  AST 290* 302* 287*  ALT 72* 82* 75*  ALKPHOS 110 129* 116  BILITOT 4.0* 5.0* 4.7*  PROT 8.3* 8.3* 7.4  ALBUMIN 3.6 3.5 3.1*   Recent Labs  Lab 09/07/18 0355  LIPASE 19   CBC: Recent Labs  Lab 09/07/18 0355 09/09/18 1820 09/10/18 0427  WBC 7.4 6.5 6.3  NEUTROABS 5.7 5.1  --   HGB 11.5* 11.0* 9.9*  HCT 34.0* 32.1* 30.6*  MCV 89.0 87.7 92.7  PLT 156 170 146*   Cardiac Enzymes: Recent Labs  Lab 09/07/18 0355 09/07/18 0716  TROPONINI <0.03 <0.03   Signed:  Barton Dubois MD.  [facility] 09/10/2018, 12:42 PM

## 2018-09-10 NOTE — Progress Notes (Signed)
Patient requested this am to use her rizatriptan from home for migraine if possible. Medication sent to pharmacy per home medication storage policy, patient aware and consented to have it sent to pharmacy. Notified MD this am, order received for patient to receive dose during admission. Stored medication returned to patient at discharge, minus the one tablet administered this am per order. Patient aware and signed for medication release. Patient left floor in stable condition via w/c accompanied by nures tech for discharge home. Donavan Foil, RN

## 2018-09-10 NOTE — Progress Notes (Signed)
Discharge instructions reviewed with patient. Given AVS, prescriptions sent to her pharmacy per MD. Patient aware to pick them up and verbalized understanding of follow-up appointments as instructed. States PCP office is not currently seeing patients but she will call for telephone appointment and f/u with Dr. Wallace Cullens as scheduled. IV site d/c'd, site within normal limits. Pt in stable condition awaiting nurse tech assistance to leave unit for discharge. Donavan Foil, RN

## 2018-09-11 LAB — HIV ANTIBODY (ROUTINE TESTING W REFLEX): HIV Screen 4th Generation wRfx: NONREACTIVE

## 2018-09-11 NOTE — ED Provider Notes (Signed)
[facility] Provider Note   CSN: [id number] Arrival date & time: 09/09/18  1658    History   Chief Complaint Chief Complaint  Patient presents with  . Abdominal Pain    HPI Pamela Long is a 48 y.o. female.     HPI   48 y.o.female,with recent diagnosis of breast cancer with metastases to liver and possibly 2 areas of thoracic spine T5 and T8 vertebral bodies. Came to hospital with complaints of abdominal pain and shortness of breath with exertion. Patient was seen in the ED yesterday for similar complaints, CTAof the chest and CT abdomen was done which was negative for pulmonary embolism. CT abdomen showed extensive malignancy in the liver, new satellite nodules when compared with 06/17/2018 abdominal CT. ED physician Dr. Eulis Foster discussed with patient's oncologistDr Debe Coder, who recommended to stopVerzenio. Patient was discharged home.  Today she came back with same symptoms with unbearable abdominal pain and nausea. Denies vomiting. Denies diarrhea at this time.  Past Medical History:  Diagnosis Date  . Anemia   . Anxiety   . Asthma   . Breast cancer ([place])    Left, 2017  . Depression   . GERD (gastroesophageal reflux disease)   . Migraine   . OAB (overactive bladder)   . Seasonal allergies     Patient Active Problem List   Diagnosis Date Noted  . Abdominal pain 09/09/2018  . RUQ pain 07/08/2018  . Metastatic breast cancer ([place]) 07/02/2018  . GERD (gastroesophageal reflux disease) 04/02/2018  . Rectal bleeding 04/02/2018  . Constipation 04/02/2018  . OAB (overactive bladder) 07/31/2017  . Vitamin D deficiency 02/26/2017  . Prediabetes 02/26/2017  . HLD (hyperlipidemia) 02/26/2017  . Asthma 02/12/2017  . Depression 02/12/2017  . Migraines 02/12/2017  . Arthralgia 02/12/2017  . Sleep-disordered breathing 02/12/2017  . Positive ANA (antinuclear antibody) 02/12/2017  . Angular cheilitis 02/12/2017  . Obesity (BMI 35.0-39.9  without comorbidity) 02/12/2017  . Malignant neoplasm of central portion of left breast in female, estrogen receptor positive ([place]) 01/05/2017    Past Surgical History:  Procedure Laterality Date  . ABDOMINAL HYSTERECTOMY     breast cancer  . ANKLE RECONSTRUCTION Right 1989  . BREAST SURGERY    . COLONOSCOPY WITH PROPOFOL N/A 08/06/2018   Procedure: COLONOSCOPY WITH PROPOFOL;  Surgeon: Danie Binder, MD;  Location: [facility];  Service: Endoscopy;  Laterality: N/A;  1:30pm  . FOREIGN BODY REMOVAL N/A 08/29/2017   from lip  . KNEE SURGERY Right 1989   bone spur  . MASTECTOMY  2017   bil mastectomies  . SINUSOTOMY       OB History    Gravida  1   Para  1   Term  1   Preterm      AB      Living        SAB      TAB      Ectopic      Multiple      Live Births               Home Medications    Prior to Admission medications   Medication Sig Start Date End Date Taking? Authorizing Provider  Cholecalciferol (VITAMIN D3 GUMMIES) 25 MCG (1000 UT) CHEW Chew 2,000-4,000 Units by mouth daily.   Yes [provider]  escitalopram (LEXAPRO) 20 MG tablet Take 1 tablet (20 mg total) by mouth daily. 02/12/17  Yes Raylene Everts, MD  fulvestrant (  FASLODEX) 250 MG/5ML injection Inject 250 mg into the muscle every 30 (thirty) days. One injection each buttock over 1-2 minutes. Warm prior to use.   Yes [provider]  gabapentin (NEURONTIN) 300 MG capsule Take 600 mg by mouth at bedtime.  03/04/18  Yes [provider]  lidocaine (LIDODERM) 5 % Place 1 patch onto the skin daily. Remove & Discard patch within 12 hours or as directed by MD 09/01/18  Yes Derek Jack, MD  naproxen sodium (ALEVE) 220 MG tablet Take 220-440 mg by mouth 2 (two) times daily as needed (for pain or headache).    Yes [provider]  pantoprazole (PROTONIX) 40 MG tablet TAKE 1 TABLET BY MOUTH DAILY BEFORE BREAKFAST Patient taking differently: Take 40 mg by  mouth daily before breakfast.  06/26/18  Yes Annitta Needs, NP  Polyvinyl Alcohol-Povidone PF (REFRESH) 1.4-0.6 % SOLN Place 1 drop into both eyes 2 (two) times daily.    Yes [provider]  rizatriptan (MAXALT-MLT) 10 MG disintegrating tablet Take 10 mg by mouth every 2 (two) hours as needed for migraine.    Yes [provider]  traMADol (ULTRAM) 50 MG tablet Take 1 tablet (50 mg total) by mouth 2 (two) times daily. 08/19/18  Yes Derek Jack, MD  TROKENDI XR 50 MG CP24 Take 50 mg by mouth 2 (two) times daily.   Yes [provider]  docusate sodium (COLACE) 100 MG capsule Take 1 capsule (100 mg total) by mouth 2 (two) times daily as needed for mild constipation. 09/10/18 09/10/19  Barton Dubois, MD  HYDROmorphone (DILAUDID) 2 MG tablet Take 1-2 tablets (2-4 mg total) by mouth every 6 (six) hours as needed for severe pain. 09/10/18   Barton Dubois, MD  ondansetron (ZOFRAN ODT) 8 MG disintegrating tablet Take 1 tablet (8 mg total) by mouth every 8 (eight) hours as needed for nausea or vomiting. 09/10/18   Barton Dubois, MD    Family History Family History  Problem Relation Age of Onset  . Arthritis Mother   . COPD Mother   . Depression Mother   . Diabetes Mother   . Kidney disease Father   . Heart disease Father 69  . Drug abuse Father   . Hypertension Father   . Diabetes Daughter   . Hashimoto's thyroiditis Daughter   . Irritable bowel syndrome Daughter   . Autism spectrum disorder Daughter   . Early death Maternal Grandmother        drowned  . Early death Maternal Grandfather   . Heart disease Maternal Grandfather   . Heart disease Paternal Grandfather   . Breast cancer Maternal Aunt   . Breast cancer Cousin   . Lung cancer Maternal Aunt   . Lung cancer Maternal Uncle   . Colon cancer Neg Hx     Social History Social History   Tobacco Use  . Smoking status: Never Smoker  . Smokeless tobacco: Never Used  Substance Use Topics  . Alcohol use: No   . Drug use: No     Allergies   Corn-containing products; Tape; Amoxicillin; Caffeine; and Tetanus toxoids   Review of Systems Review of Systems  All systems reviewed and negative, other than as noted in HPI.  Physical Exam Updated Vital Signs BP (!) 135/92 (BP Location: Right Arm)   Pulse 80   Temp 98 F (36.7 C) (Oral)   Resp 16   Ht 5\' 5"  (1.651 m)   Wt 87.9 kg   SpO2 95%  BMI 32.25 kg/m   Physical Exam Vitals signs and nursing note reviewed.  Constitutional:      Appearance: She is well-developed.  HENT:     Head: Normocephalic and atraumatic.  Eyes:     General:        Right eye: No discharge.        Left eye: No discharge.     Conjunctiva/sclera: Conjunctivae normal.  Neck:     Musculoskeletal: Neck supple.  Cardiovascular:     Rate and Rhythm: Normal rate and regular rhythm.     Heart sounds: Normal heart sounds. No murmur. No friction rub. No gallop.   Pulmonary:     Effort: Pulmonary effort is normal. No respiratory distress.     Breath sounds: Normal breath sounds.  Abdominal:     General: There is no distension.     Palpations: Abdomen is soft.     Comments: Diffuse tenderness, focally worse in upper abdomen. No peritonitis.   Musculoskeletal:        General: No tenderness.  Skin:    General: Skin is warm and dry.  Neurological:     Mental Status: She is alert.  Psychiatric:        Behavior: Behavior normal.        Thought Content: Thought content normal.      ED Treatments / Results  Labs (all labs ordered are listed, but only abnormal results are displayed) Labs Reviewed  COMPREHENSIVE METABOLIC PANEL - Abnormal; Notable for the following components:      Result Value   Total Protein 8.3 (*)    AST 302 (*)    ALT 82 (*)    Alkaline Phosphatase 129 (*)    Total Bilirubin 5.0 (*)    All other components within normal limits  CBC WITH DIFFERENTIAL/PLATELET - Abnormal; Notable for the following components:   RBC 3.66 (*)     Hemoglobin 11.0 (*)    HCT 32.1 (*)    RDW 15.6 (*)    All other components within normal limits  URINALYSIS, ROUTINE W REFLEX MICROSCOPIC - Abnormal; Notable for the following components:   Color, Urine AMBER (*)    APPearance HAZY (*)    Bilirubin Urine SMALL (*)    Ketones, ur 5 (*)    Protein, ur 100 (*)    Bacteria, UA RARE (*)    All other components within normal limits  CBC - Abnormal; Notable for the following components:   RBC 3.30 (*)    Hemoglobin 9.9 (*)    HCT 30.6 (*)    RDW 15.9 (*)    Platelets 146 (*)    All other components within normal limits  COMPREHENSIVE METABOLIC PANEL - Abnormal; Notable for the following components:   CO2 21 (*)    Albumin 3.1 (*)    AST 287 (*)    ALT 75 (*)    Total Bilirubin 4.7 (*)    All other components within normal limits  HIV ANTIBODY (ROUTINE TESTING W REFLEX)    EKG None  Radiology No results found.  Procedures Procedures (including critical care time)  Medications Ordered in ED Medications  sodium chloride 0.9 % bolus 1,000 mL (0 mLs Intravenous Stopped 09/09/18 2000)  haloperidol lactate (HALDOL) injection 2 mg (2 mg Intravenous Given 09/09/18 1828)  HYDROmorphone (DILAUDID) injection 0.5 mg (0.5 mg Intravenous Given 09/09/18 1831)  morphine 4 MG/ML injection 4 mg (4 mg Intravenous Given 09/09/18 2013)     Initial Impression /  Assessment and Plan / ED Course  I have reviewed the triage vital signs and the nursing notes.  Pertinent labs & imaging results that were available during my care of the patient were reviewed by me and considered in my medical decision making (see chart for details).        47yF with continued abdominal pain and n/v. LFTs fairly stable. I'm not sure how much related to medication versus liver masses. I think a lot of her symptoms are malignancy related. Not feeling much better despite paina nd nausea medication in the ED. Will admit for ongoing symptom mangement.   Final Clinical  Impressions(s) / ED Diagnoses   Final diagnoses:  Abdominal pain, unspecified abdominal location  Metastatic cancer ([place])  Abnormal LFTs    ED Discharge Orders         Ordered    HYDROmorphone (DILAUDID) 2 MG tablet  Every 6 hours PRN     09/10/18 1230    ondansetron (ZOFRAN ODT) 8 MG disintegrating tablet  Every 8 hours PRN     09/10/18 1230    docusate sodium (COLACE) 100 MG capsule  2 times daily PRN     09/10/18 1230    Discharge instructions    Comments:  Take medications as prescribed Follow-up with oncology service as instructed Maintain adequate hydration   09/10/18 1230           Virgel Manifold, MD 09/11/18 1222

## 2018-09-14 ENCOUNTER — Telehealth: Payer: Self-pay | Admitting: Cardiology

## 2018-09-14 ENCOUNTER — Inpatient Hospital Stay (HOSPITAL_COMMUNITY): Payer: 59

## 2018-09-14 ENCOUNTER — Other Ambulatory Visit: Payer: Self-pay

## 2018-09-14 ENCOUNTER — Encounter (HOSPITAL_COMMUNITY): Payer: Self-pay | Admitting: *Deleted

## 2018-09-14 DIAGNOSIS — M549 Dorsalgia, unspecified: Secondary | ICD-10-CM

## 2018-09-14 DIAGNOSIS — Z9011 Acquired absence of right breast and nipple: Secondary | ICD-10-CM

## 2018-09-14 DIAGNOSIS — C50919 Malignant neoplasm of unspecified site of unspecified female breast: Secondary | ICD-10-CM

## 2018-09-14 DIAGNOSIS — Z170 Estrogen receptor positive status [ER+]: Secondary | ICD-10-CM | POA: Insufficient documentation

## 2018-09-14 DIAGNOSIS — Z5111 Encounter for antineoplastic chemotherapy: Secondary | ICD-10-CM

## 2018-09-14 DIAGNOSIS — Z86711 Personal history of pulmonary embolism: Secondary | ICD-10-CM

## 2018-09-14 DIAGNOSIS — C50112 Malignant neoplasm of central portion of left female breast: Secondary | ICD-10-CM | POA: Insufficient documentation

## 2018-09-14 DIAGNOSIS — Z90710 Acquired absence of both cervix and uterus: Secondary | ICD-10-CM

## 2018-09-14 DIAGNOSIS — Z90722 Acquired absence of ovaries, bilateral: Secondary | ICD-10-CM | POA: Insufficient documentation

## 2018-09-14 DIAGNOSIS — C50111 Malignant neoplasm of central portion of right female breast: Secondary | ICD-10-CM | POA: Diagnosis not present

## 2018-09-14 DIAGNOSIS — M2550 Pain in unspecified joint: Secondary | ICD-10-CM

## 2018-09-14 DIAGNOSIS — R1011 Right upper quadrant pain: Secondary | ICD-10-CM

## 2018-09-14 LAB — COMPREHENSIVE METABOLIC PANEL
ALT: 104 U/L — ABNORMAL HIGH (ref 0–44)
AST: <5 U/L — ABNORMAL LOW (ref 15–41)
Albumin: 3.2 g/dL — ABNORMAL LOW (ref 3.5–5.0)
Alkaline Phosphatase: 169 U/L — ABNORMAL HIGH (ref 38–126)
Anion gap: 12 (ref 5–15)
BUN: 21 mg/dL — ABNORMAL HIGH (ref 6–20)
CO2: 23 mmol/L (ref 22–32)
Calcium: 9.9 mg/dL (ref 8.9–10.3)
Chloride: 103 mmol/L (ref 98–111)
Creatinine, Ser: 0.96 mg/dL (ref 0.44–1.00)
GFR calc Af Amer: >60 mL/min (ref >60)
GFR calc non Af Amer: >60 mL/min (ref >60)
Glucose, Bld: 114 mg/dL — ABNORMAL HIGH (ref 70–99)
Potassium: 3.4 mmol/L — ABNORMAL LOW (ref 3.5–5.1)
Sodium: 138 mmol/L (ref 135–145)
Total Bilirubin: 7.3 mg/dL — ABNORMAL HIGH (ref 0.3–1.2)
Total Protein: 7.9 g/dL (ref 6.5–8.1)

## 2018-09-14 LAB — CBC WITH DIFFERENTIAL/PLATELET
Abs Immature Granulocytes: 0.04 10*3/uL (ref 0.00–0.07)
Basophils Absolute: 0.2 10*3/uL — ABNORMAL HIGH (ref 0.0–0.1)
Basophils Relative: 2 %
Eosinophils Absolute: 0.2 10*3/uL (ref 0.0–0.5)
Eosinophils Relative: 2 %
HCT: 32.2 % — ABNORMAL LOW (ref 36.0–46.0)
Hemoglobin: 10.6 g/dL — ABNORMAL LOW (ref 12.0–15.0)
Immature Granulocytes: 1 %
Lymphocytes Relative: 14 %
Lymphs Abs: 1.2 10*3/uL (ref 0.7–4.0)
MCH: 30.5 pg (ref 26.0–34.0)
MCHC: 32.9 g/dL (ref 30.0–36.0)
MCV: 92.8 fL (ref 80.0–100.0)
Monocytes Absolute: 0.8 10*3/uL (ref 0.1–1.0)
Monocytes Relative: 10 %
Neutro Abs: 6.1 10*3/uL (ref 1.7–7.7)
Neutrophils Relative %: 71 %
Platelets: 202 10*3/uL (ref 150–400)
RBC: 3.47 MIL/uL — ABNORMAL LOW (ref 3.87–5.11)
RDW: 18.7 % — ABNORMAL HIGH (ref 11.5–15.5)
WBC: 8.4 10*3/uL (ref 4.0–10.5)
nRBC: 0 % (ref 0.0–0.2)

## 2018-09-14 LAB — URINALYSIS, DIPSTICK ONLY
Glucose, UA: NEGATIVE mg/dL
Hgb urine dipstick: NEGATIVE
Ketones, ur: 5 mg/dL — AB
Leukocytes,Ua: NEGATIVE
Nitrite: NEGATIVE
Protein, ur: 100 mg/dL — AB
Specific Gravity, Urine: 1.025 (ref 1.005–1.030)
pH: 5 (ref 5.0–8.0)

## 2018-09-14 NOTE — Telephone Encounter (Signed)
I reviewed the tracings from March and April, also old tracing for comparison.  Recent tracings show sinus rhythm with nonspecific ST-T changes, decreased anterior R wave progression is not necessarily diagnostic of an anterior infarct however.  LVEF was normal without wall motion abnormalities by echocardiogram in August 2019.  It sounds like she has had significant changes in her health, I reviewed the records.  Keep pending follow-up with oncologist as scheduled.  I would not necessarily pursue further cardiac testing at this time based on the ECGs alone.

## 2018-09-14 NOTE — Progress Notes (Signed)
Patient called clinic today c/o severe constipation. She states that she knows she has to go to the bathroom but doesn't have the urge to go. She states that she hasn't had a full bowel movement in 8 days. She reports passing "penny size amount " yesterday but other than that nothing.  She has been taking colace twice daily.  I advised her to begin taking miralax once daily and progress up to twice daily as needed to keep her stools soft. This is in addition to the colace. I also told her to go ahead and try an enema because she said her stomach is bloated and she can't tell if its from the liver or the stool.  She has an appointment with Dr. [name] tomorrow and I told her that he would evaluate further at that appointment. She verbalizes understanding.

## 2018-09-14 NOTE — Telephone Encounter (Signed)
Patient was seen for N/V, discharged 09/10/18.She wanted you to look at her EKG's as she said one was "abnormal"  .She states she now has liver mets and wanted you to [facility]

## 2018-09-14 NOTE — Telephone Encounter (Signed)
Patient's cancer doctor advised her to contact Dr Domenic Polite and request to be seen due to her recent asap. They are in Epic per patient

## 2018-09-14 NOTE — Telephone Encounter (Signed)
I spoke with patient and relayed Dr.McDowell's message

## 2018-09-15 ENCOUNTER — Encounter (HOSPITAL_COMMUNITY): Payer: Self-pay | Admitting: Hematology

## 2018-09-15 ENCOUNTER — Other Ambulatory Visit: Payer: Self-pay

## 2018-09-15 ENCOUNTER — Ambulatory Visit (HOSPITAL_COMMUNITY)
Admission: RE | Admit: 2018-09-15 | Discharge: 2018-09-15 | Disposition: A | Discharge status: Home / Self Care | Payer: 59 | Source: Ambulatory Visit | Attending: Hematology | Admitting: Hematology

## 2018-09-15 ENCOUNTER — Inpatient Hospital Stay (HOSPITAL_COMMUNITY): Payer: 59

## 2018-09-15 ENCOUNTER — Inpatient Hospital Stay (HOSPITAL_BASED_OUTPATIENT_CLINIC_OR_DEPARTMENT_OTHER): Payer: 59 | Admitting: Hematology

## 2018-09-15 VITALS — BP 114/71 | HR 118 | Temp 98.8°F | Resp 20 | Wt 193.2 lb

## 2018-09-15 DIAGNOSIS — Z90710 Acquired absence of both cervix and uterus: Secondary | ICD-10-CM

## 2018-09-15 DIAGNOSIS — Z9011 Acquired absence of right breast and nipple: Secondary | ICD-10-CM

## 2018-09-15 DIAGNOSIS — M2550 Pain in unspecified joint: Secondary | ICD-10-CM

## 2018-09-15 DIAGNOSIS — C50112 Malignant neoplasm of central portion of left female breast: Secondary | ICD-10-CM | POA: Diagnosis not present

## 2018-09-15 DIAGNOSIS — Z5111 Encounter for antineoplastic chemotherapy: Secondary | ICD-10-CM

## 2018-09-15 DIAGNOSIS — M549 Dorsalgia, unspecified: Secondary | ICD-10-CM

## 2018-09-15 DIAGNOSIS — C50111 Malignant neoplasm of central portion of right female breast: Secondary | ICD-10-CM | POA: Diagnosis not present

## 2018-09-15 DIAGNOSIS — Z170 Estrogen receptor positive status [ER+]: Secondary | ICD-10-CM

## 2018-09-15 DIAGNOSIS — Z90722 Acquired absence of ovaries, bilateral: Secondary | ICD-10-CM

## 2018-09-15 DIAGNOSIS — Z86711 Personal history of pulmonary embolism: Secondary | ICD-10-CM

## 2018-09-15 DIAGNOSIS — R1011 Right upper quadrant pain: Secondary | ICD-10-CM

## 2018-09-15 DIAGNOSIS — C50919 Malignant neoplasm of unspecified site of unspecified female breast: Secondary | ICD-10-CM

## 2018-09-15 LAB — BILIRUBIN, DIRECT: Bilirubin, Direct: 5.3 mg/dL — ABNORMAL HIGH (ref 0.0–0.2)

## 2018-09-15 LAB — CANCER ANTIGEN 15-3: CA 15-3: 2598 U/mL — ABNORMAL HIGH (ref 0.0–25.0)

## 2018-09-15 IMAGING — US ULTRASOUND ABDOMEN LIMITED
1 series · 14 of 25 positions shown · non-contrast
Comparison: CT [DATE]

CLINICAL DATA: 47-year-old female with a history of metastatic
breast carcinoma

EXAM:
ULTRASOUND ABDOMEN LIMITED RIGHT UPPER QUADRANT

[Series 1: ultrasound abdomen limited · 14 of 59 slices shown]
[im 1/59]
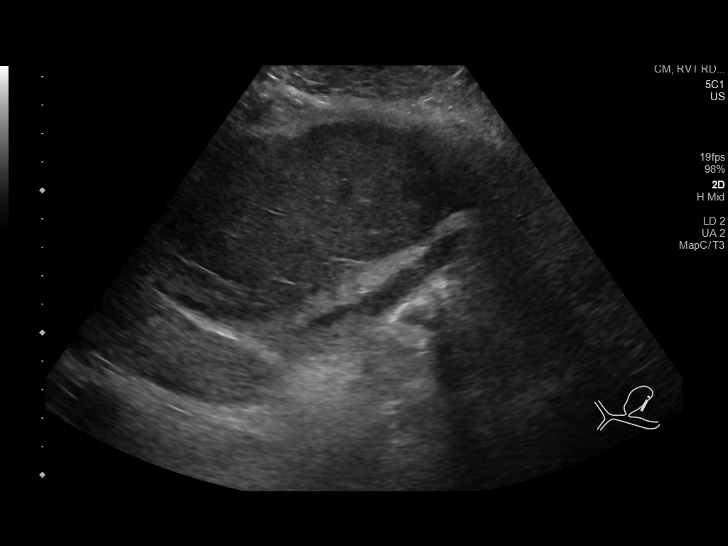
[im 5/59]
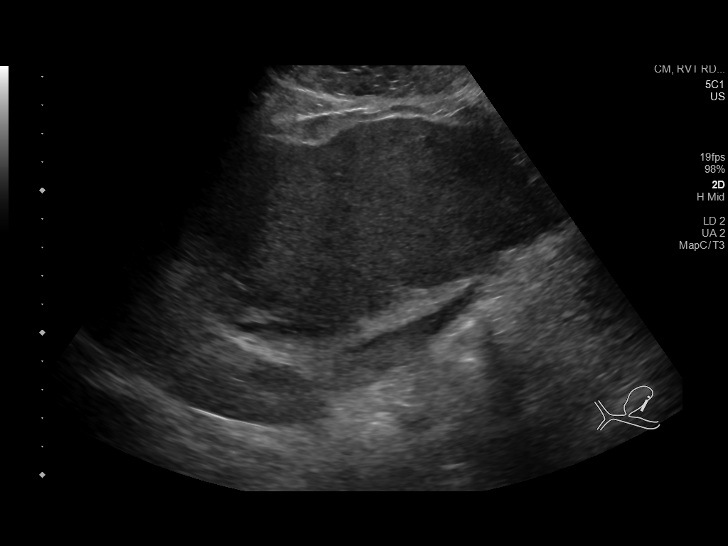
[im 10/59]
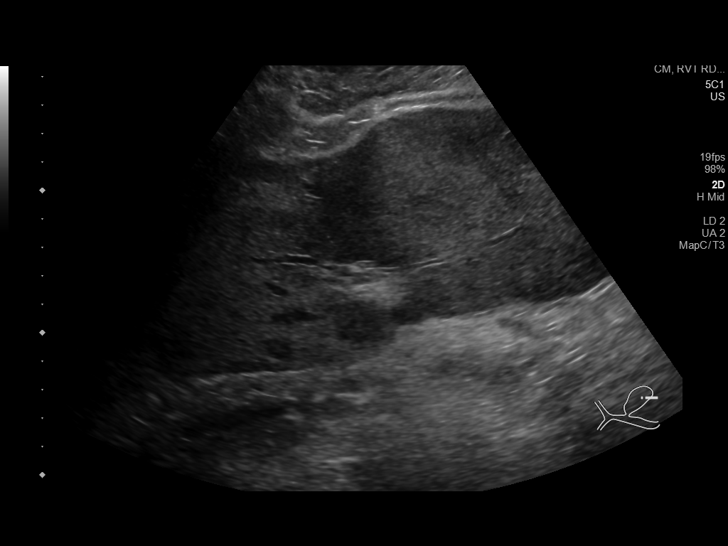
[im 15/59]
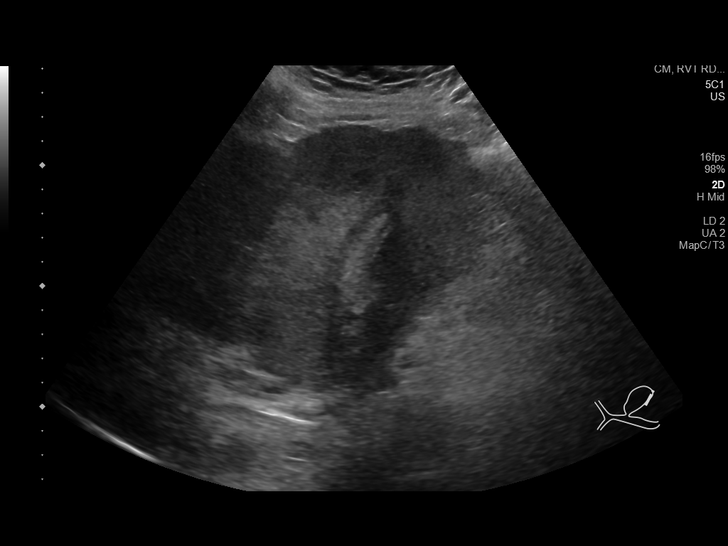
[im 20/59]
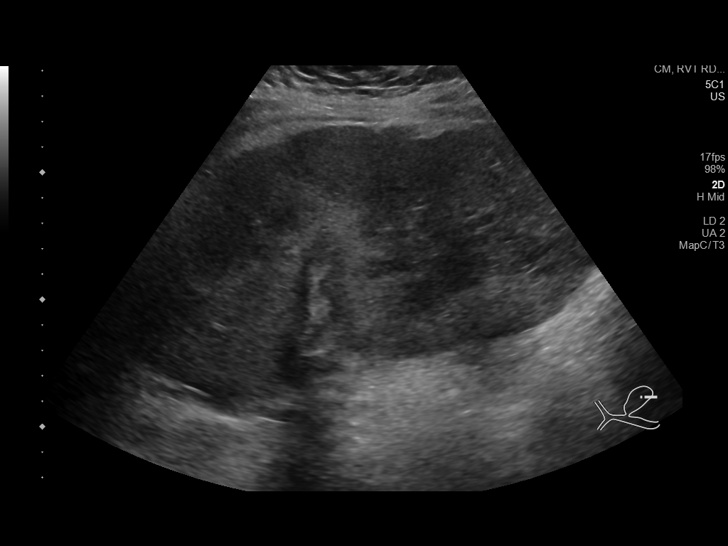
[im 22/59]
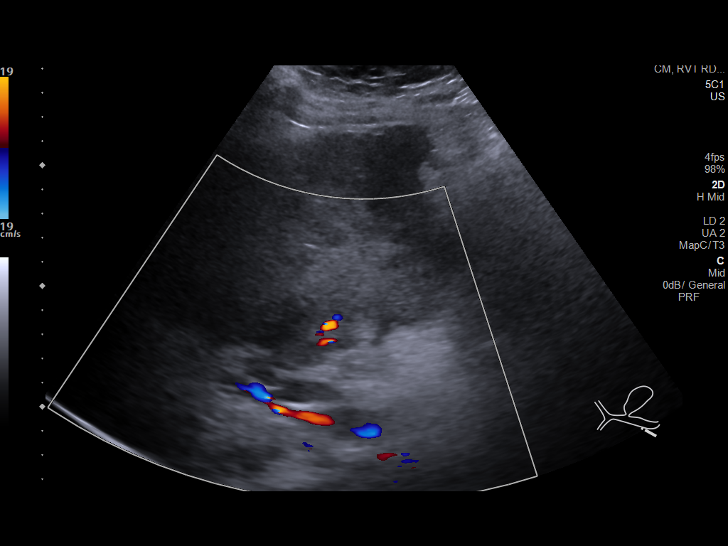
[im 27/59]
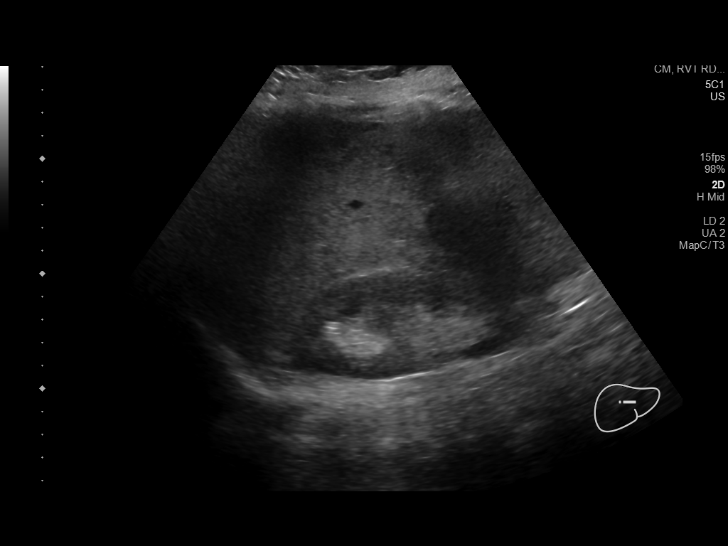
[im 32/59]
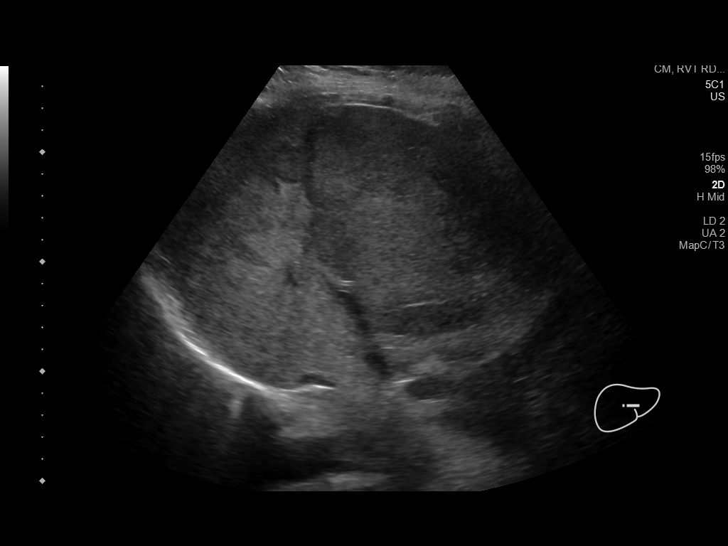
[im 37/59]
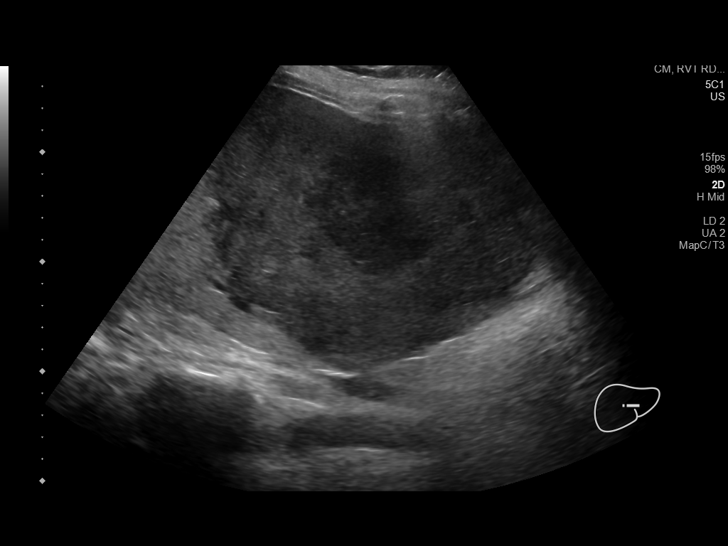
[im 39/59]
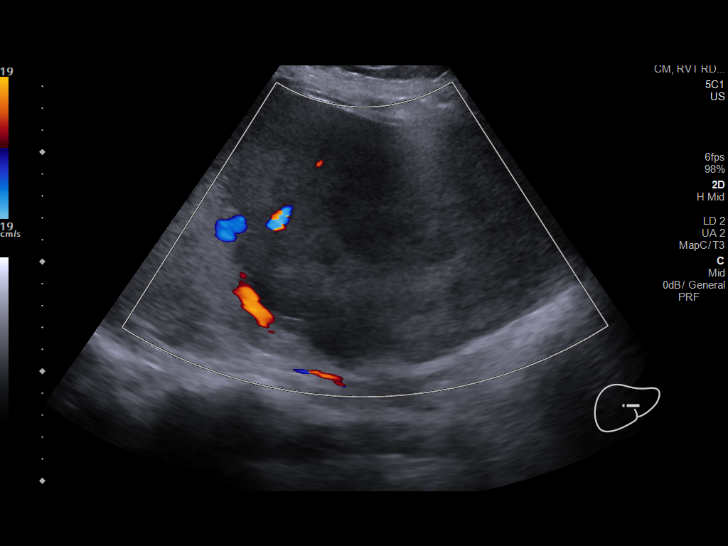
[im 44/59]
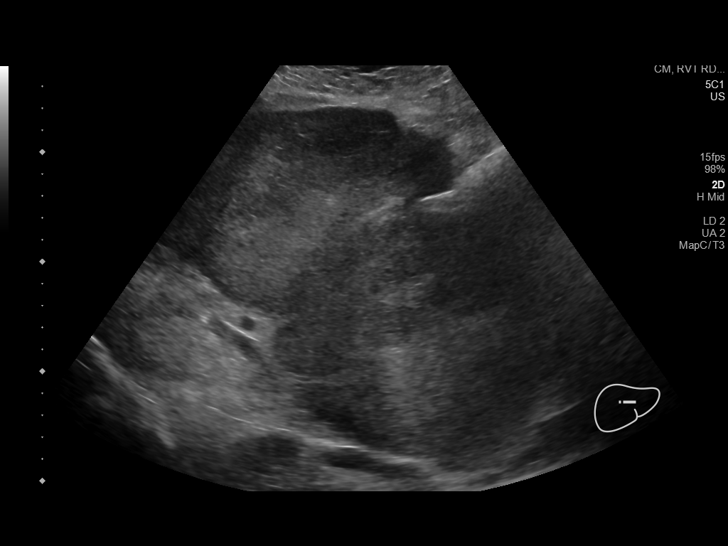
[im 49/59]
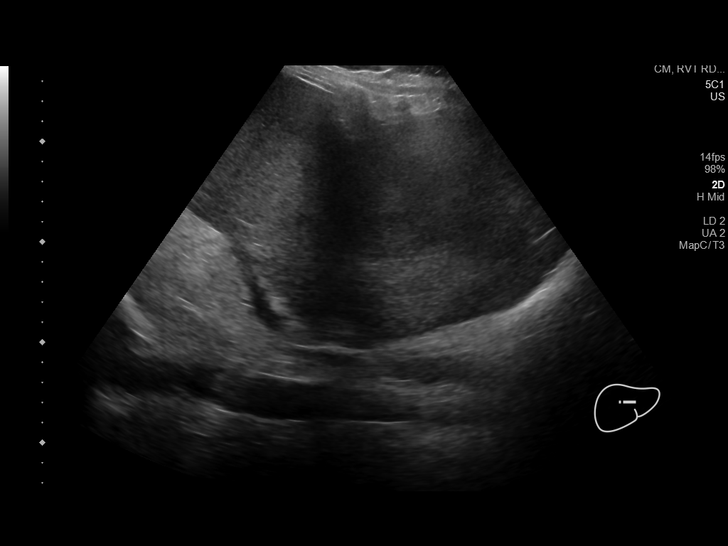
[im 54/59]
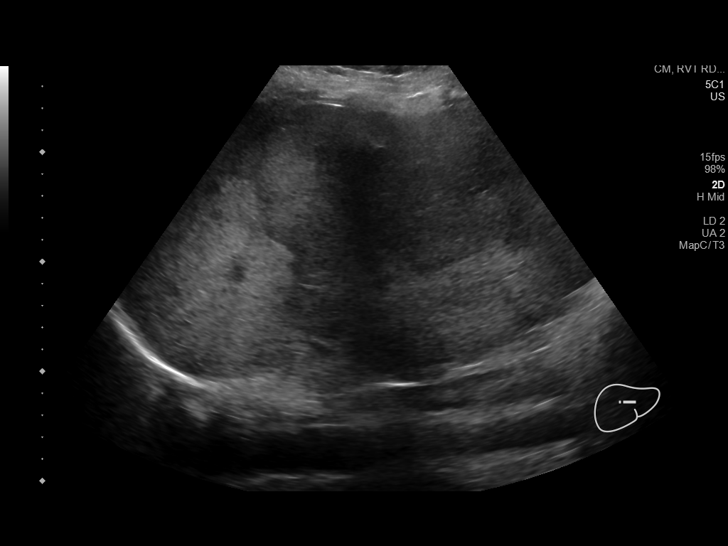
[im 59/59]
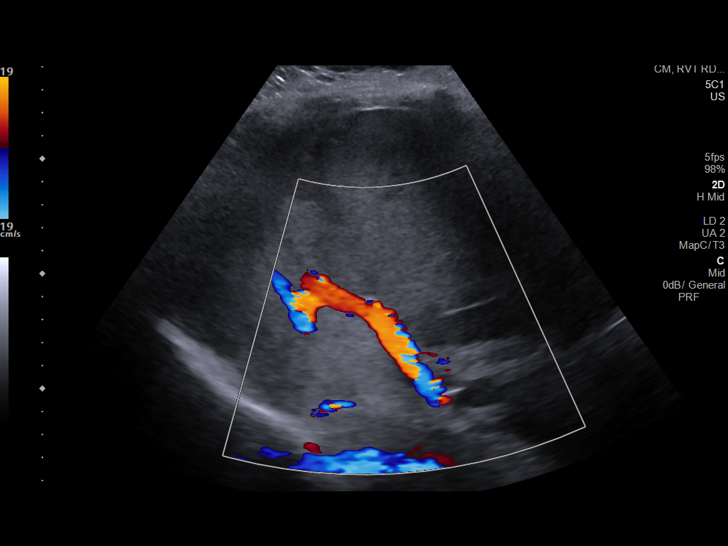

[14 of 25 positions shown; findings below may reference images not displayed]

FINDINGS: Gallbladder:

Gallbladder identified as contracted structure with hyperechoic
material within the lumen and questionable gallbladder wall
thickening, measuring 7 mm. No pericholecystic fluid. Sonographic
Murphy's sign is negative.

Common bile duct:

Diameter: 10 mm

Liver:

Heterogeneous echotexture of the liver, with multiple foci of
heterogeneously hypoechoic tissue, compatible with known metastatic
disease demonstrated on recent CT scan. Portal vein is patent on
color Doppler imaging with normal direction of blood flow towards
the liver.
IMPRESSION: Sonographic survey negative for signs of acute cholecystitis, with a
contracted gallbladder unchanged from recent CT scan. If there is
concern for biliary ductal abnormality/occlusion, recommend
correlation with lab values and potentially nuclear medicine HIDA
study.

Ultrasound demonstrates known hepatic metastases.

## 2018-09-15 MED ORDER — LACTULOSE 10 GM/15ML PO SOLN: 20.0000 g | Freq: Two times a day (BID) | ORAL | 1 refills | Status: DC | PRN

## 2018-09-15 NOTE — Progress Notes (Signed)
Pamela Long Pamela Long, [place] [address]   CLINIC:  Medical Oncology/Hematology  PCP:  Doree Albee, MD [place] [address] [phone]   REASON FOR VISIT:  Follow-up for  Metastatic breast cancer to the liver  CURRENT THERAPY:Faslodex and Verzenio       INTERVAL HISTORY:  Pamela Long 48 y.o. female returns for routine follow-up. She is here today alone. She states that she has nausea as well as itching. She has right upper quadrant pain. Denies any vomiting, or diarrhea. Denies any new pains. Had not noticed any recent bleeding such as epistaxis, hematuria or hematochezia. Denies recent chest pain on exertion, shortness of breath on minimal exertion, pre-syncopal episodes, or palpitations. Denies any numbness or tingling in hands or feet. Denies any recent fevers, infections, or recent hospitalizations. Patient reports appetite at 0% and energy level at 0%.    REVIEW OF SYSTEMS:  Review of Systems  Constitutional: Positive for fatigue.  Gastrointestinal: Positive for abdominal pain, constipation and nausea.  Skin: Positive for itching.  Psychiatric/Behavioral: Positive for sleep disturbance.  All other systems reviewed and are negative.    PAST MEDICAL/SURGICAL HISTORY:  Past Medical History:  Diagnosis Date  . Anemia   . Anxiety   . Asthma   . Breast cancer ([place])    Left, 2017  . Depression   . GERD (gastroesophageal reflux disease)   . Migraine   . OAB (overactive bladder)   . Seasonal allergies    Past Surgical History:  Procedure Laterality Date  . ABDOMINAL HYSTERECTOMY     breast cancer  . ANKLE RECONSTRUCTION Right 1989  . BREAST SURGERY    . COLONOSCOPY WITH PROPOFOL N/A 08/06/2018   Procedure: COLONOSCOPY WITH PROPOFOL;  Surgeon: Danie Binder, MD;  Location: [facility];  Service: Endoscopy;  Laterality: N/A;  1:30pm  . FOREIGN BODY REMOVAL N/A 08/29/2017   from lip  . KNEE SURGERY Right 1989   bone spur  . MASTECTOMY  2017   bil mastectomies  . SINUSOTOMY       SOCIAL HISTORY:  Social History   Socioeconomic History  . Marital status: Single    Spouse name: Not on file  . Number of children: 1  . Years of education: 67  . Highest education level: Not on file  Occupational History  . Occupation: disabled  Social Needs  . Financial resource strain: Somewhat hard  . Food insecurity:    Worry: Sometimes true    Inability: Sometimes true  . Transportation needs:    Medical: No    Non-medical: No  Tobacco Use  . Smoking status: Never Smoker  . Smokeless tobacco: Never Used  Substance and Sexual Activity  . Alcohol use: No  . Drug use: No  . Sexual activity: Not Currently  Lifestyle  . Physical activity:    Days per week: 0 days    Minutes per session: 0 min  . Stress: Rather much  Relationships  . Social connections:    Talks on phone: Once a week    Gets together: Once a week    Attends religious service: 1 to 4 times per year    Active member of club or organization: Yes    Attends meetings of clubs or organizations: Never    Relationship status: Never married  . Intimate partner violence:    Fear of current or ex partner: No    Emotionally abused: No  Physically abused: No    Forced sexual activity: No  Other Topics Concern  . Not on file  Social History Narrative   Bachelors degree   Accounting   Lives with daughter Pamela Long who has autism and diabetes   Likes to sew, quilt, crafts, crochet    FAMILY HISTORY:  Family History  Problem Relation Age of Onset  . Arthritis Mother   . COPD Mother   . Depression Mother   . Diabetes Mother   . Kidney disease Father   . Heart disease Father 59  . Drug abuse Father   . Hypertension Father   . Diabetes Daughter   . Hashimoto's thyroiditis Daughter   . Irritable bowel syndrome Daughter   . Autism spectrum disorder Daughter   . Early death Maternal Grandmother        drowned  . Early death  Maternal Grandfather   . Heart disease Maternal Grandfather   . Heart disease Paternal Grandfather   . Breast cancer Maternal Aunt   . Breast cancer Cousin   . Lung cancer Maternal Aunt   . Lung cancer Maternal Uncle   . Colon cancer Neg Hx     CURRENT MEDICATIONS:  Outpatient Encounter Medications as of 09/15/2018  Medication Sig Note  . Cholecalciferol (VITAMIN D3 GUMMIES) 25 MCG (1000 UT) CHEW Chew 2,000-4,000 Units by mouth daily.   Marland Kitchen docusate sodium (COLACE) 100 MG capsule Take 1 capsule (100 mg total) by mouth 2 (two) times daily as needed for mild constipation.   Marland Kitchen escitalopram (LEXAPRO) 20 MG tablet Take 1 tablet (20 mg total) by mouth daily.   . fulvestrant (FASLODEX) 250 MG/5ML injection Inject 250 mg into the muscle every 30 (thirty) days. One injection each buttock over 1-2 minutes. Warm prior to use. 09/09/2018: Received on 08/28/2018  . gabapentin (NEURONTIN) 300 MG capsule Take 600 mg by mouth at bedtime.    Marland Kitchen HYDROmorphone (DILAUDID) 2 MG tablet Take 1-2 tablets (2-4 mg total) by mouth every 6 (six) hours as needed for severe pain.   Marland Kitchen lidocaine (LIDODERM) 5 % Place 1 patch onto the skin daily. Remove & Discard patch within 12 hours or as directed by MD   . naproxen sodium (ALEVE) 220 MG tablet Take 220-440 mg by mouth 2 (two) times daily as needed (for pain or headache).    . ondansetron (ZOFRAN ODT) 8 MG disintegrating tablet Take 1 tablet (8 mg total) by mouth every 8 (eight) hours as needed for nausea or vomiting.   . pantoprazole (PROTONIX) 40 MG tablet TAKE 1 TABLET BY MOUTH DAILY BEFORE BREAKFAST (Patient taking differently: Take 40 mg by mouth daily before breakfast. )   . pilocarpine (SALAGEN) 5 MG tablet Take 5 mg by mouth 3 (three) times daily.   . Polyvinyl Alcohol-Povidone PF (REFRESH) 1.4-0.6 % SOLN Place 1 drop into both eyes 2 (two) times daily.    . rizatriptan (MAXALT-MLT) 10 MG disintegrating tablet Take 10 mg by mouth every 2 (two) hours as needed for migraine.     . traMADol (ULTRAM) 50 MG tablet Take 1 tablet (50 mg total) by mouth 2 (two) times daily.   Marland Kitchen TROKENDI XR 50 MG CP24 Take 50 mg by mouth 2 (two) times daily.    No facility-administered encounter medications on file as of 09/15/2018.     ALLERGIES:  Allergies  Allergen Reactions  . Corn-Containing Products Other (See Comments)    Headache and GI upset  . Tape Itching and Other (See Comments)  Depending on the [place] occurs  . Amoxicillin Hives and Other (See Comments)    Rash only DID THE REACTION INVOLVE: Swelling of the face/tongue/throat, SOB, or low BP? Sudden or severe rash/hives, skin peeling, or the inside of the mouth or nose?  Did it require medical treatment?  When did it last happen? If all above answers are "NO", may proceed with cephalosporin use.   . Caffeine Diarrhea, Nausea Only, Palpitations and Other (See Comments)    Headache  . Tetanus Toxoids Swelling and Other (See Comments)    Local reaction     PHYSICAL EXAM:  ECOG Performance status: 1  Vitals:   09/15/18 0950  BP: 114/71  Pulse: (!) 118  Resp: 20  Temp: 98.8 F (37.1 C)  SpO2: 96%   Filed Weights   09/15/18 0950  Weight: 193 lb 3.2 oz (87.6 kg)    Physical Exam Vitals signs reviewed.  Constitutional:      Appearance: Normal appearance.  Cardiovascular:     Rate and Rhythm: Normal rate and regular rhythm.     Heart sounds: Normal heart sounds.  Pulmonary:     Effort: Pulmonary effort is normal.     Breath sounds: Normal breath sounds.  Abdominal:     Palpations: Abdomen is soft. There is mass.     Tenderness: There is abdominal tenderness.  Musculoskeletal:        General: No swelling.  Skin:    General: Skin is warm.  Neurological:     General: No focal deficit present.     Mental Status: She is alert and oriented to person, place, and time.  Psychiatric:        Mood and Affect: Mood normal.        Behavior: Behavior normal.      LABORATORY DATA:   I have reviewed the labs as listed.  CBC    Component Value Date/Time   WBC 8.4 09/14/2018 1351   RBC 3.47 (L) 09/14/2018 1351   HGB 10.6 (L) 09/14/2018 1351   HCT 32.2 (L) 09/14/2018 1351   PLT 202 09/14/2018 1351   MCV 92.8 09/14/2018 1351   MCH 30.5 09/14/2018 1351   MCHC 32.9 09/14/2018 1351   RDW 18.7 (H) 09/14/2018 1351   LYMPHSABS 1.2 09/14/2018 1351   MONOABS 0.8 09/14/2018 1351   EOSABS 0.2 09/14/2018 1351   BASOSABS 0.2 (H) 09/14/2018 1351   CMP Latest Ref Rng & Units 09/14/2018 09/10/2018 09/09/2018  Glucose 70 - 99 mg/dL 114(H) 89 97  BUN 6 - 20 mg/dL 21(H) 15 16  Creatinine 0.44 - 1.00 mg/dL 0.96 0.87 0.93  Sodium 135 - 145 mmol/L 138 140 138  Potassium 3.5 - 5.1 mmol/L 3.4(L) 3.5 3.5  Chloride 98 - 111 mmol/L 103 106 103  CO2 22 - 32 mmol/L 23 21(L) 22  Calcium 8.9 - 10.3 mg/dL 9.9 9.4 9.8  Total Protein 6.5 - 8.1 g/dL 7.9 7.4 8.3(H)  Total Bilirubin 0.3 - 1.2 mg/dL 7.3(H) 4.7(H) 5.0(H)  Alkaline Phos 38 - 126 U/L 169(H) 116 129(H)  AST 15 - 41 U/L <5(L) 287(H) 302(H)  ALT 0 - 44 U/L 104(H) 75(H) 82(H)       DIAGNOSTIC IMAGING:  I have independently reviewed the scans and discussed with the patient.   I have reviewed Venita Lick LPN's note and agree with the documentation.  I personally performed a face-to-face visit, made revisions and my assessment and plan is as follows.    ASSESSMENT & PLAN:  Malignant neoplasm of central portion of left breast in female, estrogen receptor positive ([place]) 1.  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 -Underwent neoadjuvant chemotherapy with dose dense AC followed by Taxol from 07/14/2015 through 11/29/2015. - Left lumpectomy plus SLNB, consistent with infiltrating lobular carcinoma, pleomorphic features, ER/PR positive, HER-2 negative, 0 out of 3 lymph nodes positive. - Zoladex  3.6 mg IM monthly started in August 2017 followed by prophylactic right breast mastectomy plus completion of left breast simple mastectomy, radiation not indicated. -Zoladex stopped due to joint pain, restarted in October 2017, exemestane started in January 2018 - Exemestane and Zoladex held secondary to joint pains, TAH and BSO in April 2018, exemestane 25 mg daily -Moved to [place] from [place] in March 2018, and was seeing Dr. Zenovia Jordan at [place]. -Genetic testing with multicolor gene panel was negative. -In August 2018 she was switched to tamoxifen 20 mg daily.  In November 2019 she was switched to anastrozole and she took only for few weeks. -Patient started having epigastric and right upper quadrant pains in mid December 2019. -CT CAP on 06/17/2018 shows very large hepatic mass measuring 22 cm, mildly enlarged left internal mammary node at 0.8 cm, borderline enlarged left lower neck level 4 node at 0.9 cm. - Biopsy of the liver mass on 06/26/2018 showed breast cancer, ER/PR positive and HER-2 negative. -Brain MRI in January 2020 was also negative for metastatic disease. -Faslodex started on 07/02/2018 Abemaciclib 150 mg twice daily started on 07/07/2018.   -PET CT scan on 07/13/2018 shows very large centrally necrotic mass involving the right and left hepatic lobes, one other hypermetabolic lesion in the left lobe.  Multiple hypermetabolic lymph nodes in the upper abdomen, left supraclavicular space.  No thoracic adenopathy, chest wall mass or pulmonary or osseous lesions. - CT PE protocol on 08/03/2018 did not demonstrate any pulmonary embolus.  No edema or consolidation.  Stable subcentimeter left supraclavicular lymph nodes.   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  No pathological fracture or epidural extension.  Borderline to mild degenerative spinal stenosis at T11 and T12. - She went to the ER on 09/07/2018 with worsening  pain.  CT CAP on the same day was negative for pulmonary embolism.  Prominent bilateral hilar lymph nodes were seen.  Extensive malignancy in the liver.  There is new satellite nodules when compared to 06/17/2018 CT of the abdomen. -I have discussed the findings with the patient that her cancer has progressed.  We have stopped Verzenio. -Unfortunately her bilirubin is elevated at 7.  Hence she will not be a candidate for any therapy.  Only 5-FU can be given at such high bilirubin values. - I have recommended ultrasound of the liver to see if there is any obstruction.  I will see her back in 2 days for follow-up.  2.  Hyperbilirubinemia: - Differential diagnosis includes progression of the liver tumor versus obstruction. -I have recommended right upper quadrant ultrasound.  We will also obtain bilirubin fractionations.  3.  Back pain and right upper quadrant pain: -She is currently taking Dilaudid 1 to 2 tablets every 6 hours and it is helping with her pain.      Total time spent is 40 minutes with more than 50% of the time spent face-to-face discussing scan results, prognosis, treatment options and  coordination of care.  Orders placed this encounter:  Orders Placed This Encounter  Procedures  . US Abdomen Limited RUQ  . Hepatic function panel      Derek Jack, MD [place] [phone]

## 2018-09-15 NOTE — Patient Instructions (Addendum)
[place] at [facility] Discharge Instructions  You were seen today by Dr. [name]. He went over your recent lab results, your bilirubin was elevated. He will see you back in 1 week for labs and follow up.   Thank you for choosing [place] at [facility] to provide your oncology and hematology care.  To afford each patient quality time with our provider, please arrive at least 15 minutes before your scheduled appointment time.   If you have a lab appointment with the [facility] please come in thru the  Main Entrance and check in at the main information desk  You need to re-schedule your appointment should you arrive 10 or more minutes late.  We strive to give you quality time with our providers, and arriving late affects you and other patients whose appointments are after yours.  Also, if you no show three or more times for appointments you may be dismissed from the clinic at the providers discretion.     Again, thank you for choosing [facility].  Our hope is that these requests will decrease the amount of time that you wait before being seen by our physicians.       _____________________________________________________________  Should you have questions after your visit to [facility], please contact our office at [phone] between the hours of 8:00 a.m. and 4:30 p.m.  Voicemails left after 4:00 p.m. will not be returned until the following business day.  For prescription refill requests, have your pharmacy contact our office and allow 72 hours.    Cancer Center Support Programs:   > Cancer Support Group  2nd Tuesday of the month 1pm-2pm, Journey Room

## 2018-09-15 NOTE — Assessment & Plan Note (Signed)
1.  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 -Underwent neoadjuvant chemotherapy with dose dense AC followed by Taxol from 07/14/2015 through 11/29/2015. - Left lumpectomy plus SLNB, consistent with infiltrating lobular carcinoma, pleomorphic features, ER/PR positive, HER-2 negative, 0 out of 3 lymph nodes positive. - Zoladex 3.6 mg IM monthly started in August 2017 followed by prophylactic right breast mastectomy plus completion of left breast simple mastectomy, radiation not indicated. -Zoladex stopped due to joint pain, restarted in October 2017, exemestane started in January 2018 - Exemestane and Zoladex held secondary to joint pains, TAH and BSO in April 2018, exemestane 25 mg daily -Moved to [place] from [place] in March 2018, and was seeing Dr. Zenovia Jordan at [place]. -Genetic testing with multicolor gene panel was negative. -In August 2018 she was switched to tamoxifen 20 mg daily.  In November 2019 she was switched to anastrozole and she took only for few weeks. -Patient started having epigastric and right upper quadrant pains in mid December 2019. -CT CAP on 06/17/2018 shows very large hepatic mass measuring 22 cm, mildly enlarged left internal mammary node at 0.8 cm, borderline enlarged left lower neck level 4 node at 0.9 cm. - Biopsy of the liver mass on 06/26/2018 showed breast cancer, ER/PR positive and HER-2 negative. -Brain MRI in January 2020 was also negative for metastatic disease. -Faslodex started on 07/02/2018 Abemaciclib 150 mg twice daily started on 07/07/2018.   -PET CT scan on 07/13/2018 shows very large centrally necrotic mass involving the right and left hepatic lobes, one other hypermetabolic lesion in the left lobe.  Multiple hypermetabolic lymph nodes in the upper abdomen, left supraclavicular space.  No thoracic adenopathy,  chest wall mass or pulmonary or osseous lesions. - CT PE protocol on 08/03/2018 did not demonstrate any pulmonary embolus.  No edema or consolidation.  Stable subcentimeter left supraclavicular lymph nodes.   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  No pathological fracture or epidural extension.  Borderline to mild degenerative spinal stenosis at T11 and T12. - She went to the ER on 09/07/2018 with worsening pain.  CT CAP on the same day was negative for pulmonary embolism.  Prominent bilateral hilar lymph nodes were seen.  Extensive malignancy in the liver.  There is new satellite nodules when compared to 06/17/2018 CT of the abdomen. -I have discussed the findings with the patient that her cancer has progressed.  We have stopped Verzenio. -Unfortunately her bilirubin is elevated at 7.  Hence she will not be a candidate for any therapy.  Only 5-FU can be given at such high bilirubin values. - I have recommended ultrasound of the liver to see if there is any obstruction.  I will see her back in 2 days for follow-up.  2.  Hyperbilirubinemia: - Differential diagnosis includes progression of the liver tumor versus obstruction. -I have recommended right upper quadrant ultrasound.  We will also obtain bilirubin fractionations.  3.  Back pain and right upper quadrant pain: -She is currently taking Dilaudid 1 to 2 tablets every 6 hours and it is helping with her pain.

## 2018-09-16 ENCOUNTER — Other Ambulatory Visit: Payer: Self-pay

## 2018-09-17 ENCOUNTER — Inpatient Hospital Stay (HOSPITAL_BASED_OUTPATIENT_CLINIC_OR_DEPARTMENT_OTHER): Payer: 59 | Admitting: Hematology

## 2018-09-17 ENCOUNTER — Inpatient Hospital Stay (HOSPITAL_COMMUNITY): Payer: 59

## 2018-09-17 ENCOUNTER — Encounter (HOSPITAL_COMMUNITY): Payer: Self-pay | Admitting: Hematology

## 2018-09-17 ENCOUNTER — Other Ambulatory Visit (HOSPITAL_COMMUNITY): Payer: Self-pay | Admitting: *Deleted

## 2018-09-17 ENCOUNTER — Other Ambulatory Visit (HOSPITAL_COMMUNITY): Payer: 59

## 2018-09-17 DIAGNOSIS — Z90722 Acquired absence of ovaries, bilateral: Secondary | ICD-10-CM

## 2018-09-17 DIAGNOSIS — M2550 Pain in unspecified joint: Secondary | ICD-10-CM | POA: Diagnosis not present

## 2018-09-17 DIAGNOSIS — Z170 Estrogen receptor positive status [ER+]: Secondary | ICD-10-CM | POA: Diagnosis not present

## 2018-09-17 DIAGNOSIS — M549 Dorsalgia, unspecified: Secondary | ICD-10-CM

## 2018-09-17 DIAGNOSIS — Z9011 Acquired absence of right breast and nipple: Secondary | ICD-10-CM

## 2018-09-17 DIAGNOSIS — Z86711 Personal history of pulmonary embolism: Secondary | ICD-10-CM

## 2018-09-17 DIAGNOSIS — Z5111 Encounter for antineoplastic chemotherapy: Secondary | ICD-10-CM | POA: Diagnosis not present

## 2018-09-17 DIAGNOSIS — C50111 Malignant neoplasm of central portion of right female breast: Secondary | ICD-10-CM | POA: Diagnosis not present

## 2018-09-17 DIAGNOSIS — Z7189 Other specified counseling: Secondary | ICD-10-CM

## 2018-09-17 DIAGNOSIS — R1011 Right upper quadrant pain: Secondary | ICD-10-CM

## 2018-09-17 DIAGNOSIS — C50112 Malignant neoplasm of central portion of left female breast: Secondary | ICD-10-CM

## 2018-09-17 DIAGNOSIS — Z90710 Acquired absence of both cervix and uterus: Secondary | ICD-10-CM

## 2018-09-17 MED ORDER — HYDROMORPHONE HCL 2 MG PO TABS: 2.0000 mg | ORAL_TABLET | Freq: Four times a day (QID) | ORAL | 0 refills | Status: DC | PRN

## 2018-09-17 MED ORDER — ONDANSETRON 8 MG PO TBDP: 8.0000 mg | ORAL_TABLET | Freq: Three times a day (TID) | ORAL | 1 refills | Status: DC | PRN

## 2018-09-17 NOTE — Patient Instructions (Addendum)
[place] at [facility]  Discharge Instructions:  You saw Dr. [name] today. He discussed with you the results of your labs and the results of your recent abdominal ultrasound.  He talked with you about your options moving forward.  We will call you when we talk to the specialist at [facility].  _______________________________________________________________  Thank you for choosing [place] at [facility] to provide your oncology and hematology care.  To afford each patient quality time with our providers, please arrive at least 15 minutes before your scheduled appointment.  You need to re-schedule your appointment if you arrive 10 or more minutes late.  We strive to give you quality time with our providers, and arriving late affects you and other patients whose appointments are after yours.  Also, if you no show three or more times for appointments you may be dismissed from the clinic.  Again, thank you for choosing [place] at [place] hope is that these requests will allow you access to exceptional care and in a timely manner. _______________________________________________________________  If you have questions after your visit, please contact our office at [phone] between the hours of 8:30 a.m. and 5:00 p.m. Voicemails left after 4:30 p.m. will not be returned until the following business day. _______________________________________________________________  For prescription refill requests, have your pharmacy contact our office. _______________________________________________________________  Recommendations made by the consultant and any test results will be sent to your referring physician. _______________________________________________________________

## 2018-09-17 NOTE — Progress Notes (Signed)
Chemotherapy packet pulled together.

## 2018-09-17 NOTE — Patient Instructions (Signed)
[facility] Chemotherapy Teaching   You have been diagnosed with metastatic breast cancer.  We are going to be treating you with palliative intent which means that your cancer is not curable but it is our intent to control it.  We are going to be treating you with Taxol (paclitaxel) every week. This will be given through a PICC line that will be inserted today. You will see the doctor regularly throughout treatment.  We monitor your lab work prior to every treatment. The doctor monitors your response to treatment by the way you are feeling, your blood work, and scans periodically. There will be wait times while you are here for treatment.  It will take about 30 minutes to 1 hour for your lab work to result.  Then there will be wait times while pharmacy mixes your medications.   You will receive the the following premedications prior to each of the Taxol treatments:  Tylenol and Benadryl: helps prevent reaction to chemotherapy.   Pepcid: an antihistamine that is used to reduce indigestion and heartburn, which can feel like and sometimes lead to nausea and vomiting.  Dexamethasone  - steroid - given to reduce the risk of you having an allergic type reaction to the chemotherapy. Dexamethasone can cause you to feel energized, nervous/anxious/jittery, make you have trouble sleeping, and/or make you feel hot/flushed in the face/neck and/or look pink/red in the face/neck. These side effects will pass as the medicine wears off.    Paclitaxel (Taxol)  About This Drug Paclitaxel is a drug used to treat cancer. It is given in the vein (IV).  This will take 1 hour to infuse.  This first infusion will take longer to infuse because it is increased slowly to monitor for reactions.  The nurse will be in the room with you for the first 15 minutes of the first infusion.  Possible Side Effects . Hair loss. Hair loss is often temporary, although with certain medicine, hair loss can sometimes be  permanent. Hair loss may happen suddenly or gradually. If you lose hair, you may lose it from your head, face, armpits, pubic area, chest, and/or legs. You may also notice your hair getting thin. . Swelling of your legs, ankles and/or feet (edema) . Flushing . Nausea and throwing up (vomiting) . Loose bowel movements (diarrhea) . Bone marrow depression. This is a decrease in the number of white blood cells, red blood cells, and platelets. This may raise your risk of infection, make you tired and weak (fatigue), and raise your risk of bleeding. . Effects on the nerves are called peripheral neuropathy. You may feel numbness, tingling, or pain in your hands and feet. It may be hard for you to button your clothes, open jars, or walk as usual. The effect on the nerves may get worse with more doses of the drug. These effects get better in some people after the drug is stopped but it does not get better in all people. . Changes in your liver function . Bone, joint and muscle pain . Abnormal EKG . Allergic reaction: Allergic reactions, including anaphylaxis are rare but may happen in some patients. Signs of allergic reaction to this drug may be swelling of the face, feeling like your tongue or throat are swelling, trouble breathing, rash, itching, fever, chills, feeling dizzy, and/or feeling that your heart is beating in a fast or not normal way. If this happens, do not take another dose of this drug. You should get urgent medical treatment. Marland Kitchen  Infection . Changes in your kidney function. Note: Each of the side effects above was reported in 20% or greater of patients treated with paclitaxel. Not all possible side effects are included above.  Warnings and Precautions . Severe allergic reactions . Severe bone marrow depression  Treating Side Effects . To help with hair loss, wash with a mild shampoo and avoid washing your hair every day. . Avoid rubbing your scalp, instead, pat your hair or scalp dry .  Avoid coloring your hair . Limit your use of hair spray, electric curlers, blow dryers, and curling irons. . If you are interested in getting a wig, talk to your nurse. You can also call the [facility] at [phone] to find out information about the "Look Good, Feel Better" program close to where you live. It is a free program where women getting chemotherapy can learn about wigs, turbans and scarves as well as makeup techniques and skin and nail care. . Ask your doctor or nurse about medicines that are available to help stop or lessen diarrhea and/or nausea. . To help with nausea and vomiting, eat small, frequent meals instead of three large meals a day. Choose foods and drinks that are at room temperature. Ask your nurse or doctor about other helpful tips and medicine that is available to help or stop lessen these symptoms. . If you get diarrhea, eat low-fiber foods that are high in protein and calories and avoid foods that can irritate your digestive tracts or lead to cramping. Ask your nurse or doctor about medicine that can lessen or stop your diarrhea. . Mouth care is very important. Your mouth care should consist of routine, gentle cleaning of your teeth or dentures and rinsing your mouth with a mixture of 1/2 teaspoon of salt in 8 ounces of water or  teaspoon of baking soda in 8 ounces of water. This should be done at least after each meal and at bedtime. . If you have mouth sores, avoid mouthwash that has alcohol. Also avoid alcohol and smoking because they can bother your mouth and throat. . Drink plenty of fluids (a minimum of eight glasses per day is recommended). . Take your temperature as your doctor or nurse tells you, and whenever you feel like you may have a fever. . Talk to your doctor or nurse about precautions you can take to avoid infections and bleeding. . Be careful when cooking, walking, and handling sharp objects and hot liquids.  Food and Drug Interactions .  There are no known interactions of paclitaxel with food. . This drug may interact with other medicines. Tell your doctor and pharmacist about all the medicines and dietary supplements (vitamins, minerals, herbs and others) that you are taking at this time. . The safety and use of dietary supplements and alternative diets are often not known. Using these might affect your cancer or interfere with your treatment. Until more is known, you should not use dietary supplements or alternative diets without your cancer doctor's help.  When to Call the Doctor Call your doctor or nurse if you have any of the following symptoms and/or any new or unusual symptoms: . Fever of 100.5 F (38 C) or above . Chills . Redness, pain, warmth, or swelling at the IV site during the infusion . Signs of allergic reaction: swelling of the face, feeling like your tongue or throat are swelling, trouble breathing, rash, itching, fever, chills, feeling dizzy, and/or feeling that your heart is beating in a fast or  not normal way . Feeling that your heart is beating in a fast or not normal way (palpitations) . Weight gain of 5 pounds in one week (fluid retention) . Decreased urine or very dark urine . Signs of liver problems: dark urine, pale bowel movements, bad stomach pain, feeling very tired and weak, unusual itching, or yellowing of the eyes or skin . Heavy menstrual period that lasts longer than normal . Easy bruising or bleeding . Nausea that stops you from eating or drinking, and/or that is not relieved by prescribed medicines. . Loose bowel movements (diarrhea) more than 4 times a day or diarrhea with weakness or lightheadedness . Pain in your mouth or throat that makes it hard to eat or drink . Lasting loss of appetite or rapid weight loss of five pounds in a week . Signs of peripheral neuropathy: numbness, tingling, or decreased feeling in fingers or toes; trouble walking or changes in the way you walk; or feeling  clumsy when buttoning clothes, opening jars, or other routine activities . Joint and muscle pain that is not relieved by prescribed medicines . Extreme fatigue that interferes with normal activities . While you are getting this drug, please tell your nurse right away if you have any pain, redness, or swelling at the site of the IV infusion. . If you think you are pregnant.  Reproduction Warnings . Pregnancy warning: This drug may have harmful effects on the unborn child, it is recommended that effective methods of birth control should be used during your cancer treatment. Let your doctor know right away if you think you may be pregnant. . Breast feeding warning: Women should not breast feed during treatment because this drug could enter the breastmilk and cause harm to a breast feeding baby.   SELF CARE ACTIVITIES WHILE ON CHEMOTHERAPY:  Hydration Increase your fluid intake 48 hours prior to treatment and drink at least 8 to 12 cups (64 ounces) of water/decaffeinated beverages per day after treatment. You can still have your cup of coffee or soda but these beverages do not count as part of your 8 to 12 cups that you need to drink daily. No alcohol intake.  Medications Continue taking your normal prescription medication as prescribed.  If you start any new herbal or new supplements please let us know first to make sure it is safe.  Mouth Care Have teeth cleaned professionally before starting treatment. Keep dentures and partial plates clean. Use soft toothbrush and do not use mouthwashes that contain alcohol. Biotene is a good mouthwash that is available at most pharmacies or may be ordered by calling [phone]. Use warm salt water gargles (1 teaspoon salt per 1 quart warm water) before and after meals and at bedtime. Or you may rinse with 2 tablespoons of three-percent hydrogen peroxide mixed in eight ounces of water. If you are still having problems with your mouth or sores in your mouth  please call the clinic. If you need dental work, please let the doctor know before you go for your appointment so that we can coordinate the best possible time for you in regards to your chemo regimen. You need to also let your dentist know that you are actively taking chemo. We may need to do labs prior to your dental appointment.  Skin Care Always use sunscreen that has not expired and with SPF (Sun Protection Factor) of 50 or higher. Wear hats to protect your head from the sun. Remember to use sunscreen on your hands, ears,  face, & feet.  Use good moisturizing lotions such as udder cream, eucerin, or even Vaseline. Some chemotherapies can cause dry skin, color changes in your skin and nails.    . Avoid long, hot showers or baths. . Use gentle, fragrance-free soaps and laundry detergent. . Use moisturizers, preferably creams or ointments rather than lotions because the thicker consistency is better at preventing skin dehydration. Apply the cream or ointment within 15 minutes of showering. Reapply moisturizer at night, and moisturize your hands every time after you wash them.  Hair Loss (if your doctor says your hair will fall out)  . If your doctor says that your hair is likely to fall out, decide before you begin chemo whether you want to wear a wig. You may want to shop before treatment to match your hair color. . Hats, turbans, and scarves can also camouflage hair loss, although some people prefer to leave their heads uncovered. If you go bare-headed outdoors, be sure to use sunscreen on your scalp. . Cut your hair short. It eases the inconvenience of shedding lots of hair, but it also can reduce the emotional impact of watching your hair fall out. . Don't perm or color your hair during chemotherapy. Those chemical treatments are already damaging to hair and can enhance hair loss. Once your chemo treatments are done and your hair has grown back, it's OK to resume dyeing or perming hair. With  chemotherapy, hair loss is almost always temporary. But when it grows back, it may be a different color or texture. In older adults who still had hair color before chemotherapy, the new growth may be completely gray.  Often, new hair is very fine and soft.  Infection Prevention Please wash your hands for at least 30 seconds using warm soapy water. Handwashing is the #1 way to prevent the spread of germs. Stay away from sick people or people who are getting over a cold. If you develop respiratory systems such as green/yellow mucus production or productive cough or persistent cough let us know and we will see if you need an antibiotic. It is a good idea to keep a pair of gloves on when going into grocery stores/[employer] to decrease your risk of coming into contact with germs on the carts, etc. Carry alcohol hand gel with you at all times and use it frequently if out in public. If your temperature reaches 100.5 or higher please call the clinic and let us know.  If it is after hours or on the weekend please go to the ER if your temperature is over 100.5.  Please have your own personal thermometer at home to use.    Sex and bodily fluids If you are going to have sex, a condom must be used to protect the person that isn't taking chemotherapy. Chemo can decrease your libido (sex drive). For a few days after chemotherapy, chemotherapy can be excreted through your bodily fluids.  When using the toilet please close the lid and flush the toilet twice.  Do this for a few day after you have had chemotherapy.   Effects of chemotherapy on your sex life Some changes are simple and won't last long. They won't affect your sex life permanently. Sometimes you may feel: . too tired . not strong enough to be very active . sick or sore  . not in the mood . anxious or low Your anxiety might not seem related to sex. For example, you may be worried about the cancer and how  your treatment is going. Or you may be worried about  money, or about how you family are coping with your illness. These things can cause stress, which can affect your interest in sex. It's important to talk to your partner about how you feel. Remember - the changes to your sex life don't usually last long. There's usually no medical reason to stop having sex during chemo. The drugs won't have any long term physical effects on your performance or enjoyment of sex. Cancer can't be passed on to your partner during sex  Contraception It's important to use reliable contraception during treatment. Avoid getting pregnant while you or your partner are having chemotherapy. This is because the drugs may harm the baby. Sometimes chemotherapy drugs can leave a man or woman infertile.  This means you would not be able to have children in the future. You might want to talk to someone about permanent infertility. It can be very difficult to learn that you may no longer be able to have children. Some people find counselling helpful. There might be ways to preserve your fertility, although this is easier for men than for women. You may want to speak to a fertility expert. You can talk about sperm banking or harvesting your eggs. You can also ask about other fertility options, such as donor eggs. If you have or have had breast cancer, your doctor might advise you not to take the contraceptive pill. This is because the hormones in it might affect the cancer.  It is not known for sure whether or not chemotherapy drugs can be passed on through semen or secretions from the vagina. Because of this some doctors advise people to use a barrier method if you have sex during treatment. This applies to vaginal, anal or oral sex. Generally, doctors advise a barrier method only for the time you are actually having the treatment and for about a week after your treatment. Advice like this can be worrying, but this does not mean that you have to avoid being intimate with your partner. You  can still have close contact with your partner and continue to enjoy sex.  Animals If you have cats or birds we just ask that you not change the litter or change the cage.  Please have someone else do this for you while you are on chemotherapy.   Food Safety During and After Cancer Treatment Food safety is important for people both during and after cancer treatment. Cancer and cancer treatments, such as chemotherapy, radiation therapy, and stem cell/bone marrow transplantation, often weaken the immune system. This makes it harder for your body to protect itself from foodborne illness, also called food poisoning. Foodborne illness is caused by eating food that contains harmful bacteria, parasites, or viruses.  Foods to avoid Some foods have a higher risk of becoming tainted with bacteria. These include: Marland Kitchen Unwashed fresh fruit and vegetables, especially leafy vegetables that can hide dirt and other contaminants . Raw sprouts, such as alfalfa sprouts . Raw or undercooked beef, especially ground beef, or other raw or undercooked meat and poultry . Fatty, fried, or spicy foods immediately before or after treatment.  These can sit heavy on your stomach and make you feel nauseous. . Raw or undercooked shellfish, such as oysters. . Sushi and sashimi, which often contain raw fish.  . Unpasteurized beverages, such as unpasteurized fruit juices, raw milk, raw yogurt, or cider . Undercooked eggs, such as soft boiled, over easy, and poached; raw, unpasteurized eggs; or  foods made with raw egg, such as homemade raw cookie dough and homemade mayonnaise Simple steps for food safety Shop smart. . Do not buy food stored or displayed in an unclean area. . Do not buy bruised or damaged fruits or vegetables. . Do not buy cans that have cracks, dents, or bulges. . Pick up foods that can spoil at the end of your shopping trip and store them in a cooler on the way home. Prepare and clean up foods carefully. . Rinse  all fresh fruits and vegetables under running water, and dry them with a clean towel or paper towel. . Clean the top of cans before opening them. . After preparing food, wash your hands for 20 seconds with hot water and soap. Pay special attention to areas between fingers and under nails. . Clean your utensils and dishes with hot water and soap. Marland Kitchen Disinfect your kitchen and cutting boards using 1 teaspoon of liquid, unscented bleach mixed into 1 quart of water.   Dispose of old food. . Eat canned and packaged food before its expiration date (the "use by" or "best before" date). . Consume refrigerated leftovers within 3 to 4 days. After that time, throw out the food. Even if the food does not smell or look spoiled, it still may be unsafe. Some bacteria, such as Listeria, can grow even on foods stored in the refrigerator if they are kept for too long. Take precautions when eating out. . At restaurants, avoid buffets and salad bars where food sits out for a long time and comes in contact with many people. Food can become contaminated when someone with a virus, often a norovirus, or another "bug" handles it. . Put any leftover food in a "to-go" container yourself, rather than having the server do it. And, refrigerate leftovers as soon as you get home. . Choose restaurants that are clean and that are willing to prepare your food as you order it cooked.   MEDICATIONS:                                                                                                                                                                Compazine/Prochlorperazine 10mg  tablet. Take 1 tablet every 6 hours as needed for nausea/vomiting. (This can make you sleepy)   Over-the-Counter Meds:  Colace - 100 mg capsules - take 2 capsules daily.  If this doesn't help then you can increase to 2 capsules twice daily.  Call us if this does not help your bowels move.   Imodium 2mg  capsule. Take 2 capsules after the 1st  loose stool and then 1 capsule every 2 hours until you go a total of 12 hours without having a loose stool. Call the [facility] if loose stools continue. If diarrhea occurs  at bedtime, take 2 capsules at bedtime. Then take 2 capsules every 4 hours until morning. Call [facility].    Diarrhea Sheet   If you are having loose stools/diarrhea, please purchase Imodium and begin taking as outlined:  At the first sign of poorly formed or loose stools you should begin taking Imodium (loperamide) 2 mg capsules.  Take two caplets (4mg ) followed by one caplet (2mg ) every 2 hours until you have had no diarrhea for 12 hours.  During the night take two caplets (4mg ) at bedtime and continue every 4 hours during the night until the morning.  Stop taking Imodium only after there is no sign of diarrhea for 12 hours.    Always call the [facility] if you are having loose stools/diarrhea that you can't get under control.  Loose stools/diarrhea leads to dehydration (loss of water) in your body.  We have other options of trying to get the loose stools/diarrhea to stop but you must let us know!   Constipation Sheet  Colace - 100 mg capsules - take 2 capsules daily.  If this doesn't help then you can increase to 2 capsules twice daily.  Please call if the above does not work for you.   Do not go more than 2 days without a bowel movement.  It is very important that you do not become constipated.  It will make you feel sick to your stomach (nausea) and can cause abdominal pain and vomiting.   Nausea Sheet   Compazine/Prochlorperazine 10mg  tablet. Take 1 tablet every 6 hours as needed for nausea/vomiting. (This can make you sleepy)  If you are having persistent nausea (nausea that does not stop) please call the [facility] and let us know the amount of nausea that you are experiencing.  If you begin to vomit, you need to call the [facility] and if it is the weekend and you have vomited more than one time  and can't get it to stop-go to the Emergency Room.  Persistent nausea/vomiting can lead to dehydration (loss of fluid in your body) and will make you feel terrible.   Ice chips, sips of clear liquids, foods that are @ room temperature, crackers, and toast tend to be better tolerated.   SYMPTOMS TO REPORT AS SOON AS POSSIBLE AFTER TREATMENT:   FEVER GREATER THAN 100.5 F  CHILLS WITH OR WITHOUT FEVER  NAUSEA AND VOMITING THAT IS NOT CONTROLLED WITH YOUR NAUSEA MEDICATION  UNUSUAL SHORTNESS OF BREATH  UNUSUAL BRUISING OR BLEEDING  TENDERNESS IN MOUTH AND THROAT WITH OR WITHOUT PRESENCE OF ULCERS  URINARY PROBLEMS  BOWEL PROBLEMS  UNUSUAL RASH      Wear comfortable clothing and clothing appropriate for easy access to any Portacath or PICC line. Let us know if there is anything that we can do to make your therapy better!    What to do if you need assistance after hours or on the weekends: CALL [phone].  HOLD on the line, do not hang up.  You will hear multiple messages but at the end you will be connected with a nurse triage line.  They will contact the doctor if necessary.  Most of the time they will be able to assist you.  Do not call the hospital operator.      I have been informed and understand all of the instructions given to me and have received a copy. I have been instructed to call the clinic [phone] or my family physician as soon as  possible for continued medical care, if indicated. I do not have any more questions at this time but understand that I may call the [facility] or the Patient Navigator at [phone] during office hours should I have questions or need assistance in obtaining follow-up care.

## 2018-09-17 NOTE — Progress Notes (Signed)
START ON PATHWAY REGIMEN - Breast     A cycle is every 28 days (3 weeks on and 1 week off):     Paclitaxel   **Always confirm dose/schedule in your pharmacy ordering system**  Patient Characteristics: Distant Metastases or Locoregional Recurrent Disease - Unresected or Locally Advanced Unresectable Disease Progressing after Neoadjuvant and Local Therapies, HER2 Negative/Unknown/Equivocal, ER Positive, Chemotherapy, First Line Therapeutic Status: Distant Metastases BRCA Mutation Status: Did Not Order Test ER Status: Positive (+) HER2 Status: Negative (-) PR Status: Positive (+) Line of Therapy: First Line Intent of Therapy: Non-Curative / Palliative Intent, Discussed with Patient 

## 2018-09-17 NOTE — Addendum Note (Signed)
Addended by: Derek Jack on: 09/17/2018 04:19 PM   Modules accepted: Orders

## 2018-09-17 NOTE — Progress Notes (Signed)
Pamela Long, [place] [id number]   CLINIC:  Medical Oncology/Hematology  PCP:  Doree Albee, MD [place] [address] [phone]   REASON FOR VISIT:  Follow-up for metastatic breast cancer to the liver  CURRENT THERAPY: none    INTERVAL HISTORY:  Pamela Long 48 y.o. female returns for routine follow-up.  She complains of no energy and fatigue.  Her stools have definitely changed.  She took the lactulose that we prescribed and did have a pale colored bowel movement but now has diarrhea.  She complains of nausea and abdominal pain every time she eats or drinks.  She reports drinking one boost daily. She has not noticed any recent bleeding such as epistaxis, hematuria or hematochezia. Denies recent chest pain on exertion, shortness of breath on minimal exertion, pre-syncopal episodes, or palpitations. Denies any numbness or tingling in hands or feet. Denies any recent fevers, infections, or recent hospitalizations. Patient reports appetite at 0-25% and energy level at 25%.      REVIEW OF SYSTEMS:  Review of Systems  Constitutional: Positive for fatigue.  Respiratory: Positive for shortness of breath.   Gastrointestinal: Positive for diarrhea and nausea.  Skin: Positive for itching.  Neurological: Positive for dizziness.  Psychiatric/Behavioral: Positive for sleep disturbance.  All other systems reviewed and are negative.    PAST MEDICAL/SURGICAL HISTORY:  Past Medical History:  Diagnosis Date  . Anemia   . Anxiety   . Asthma   . Breast cancer ([place])    Left, 2017  . Depression   . GERD (gastroesophageal reflux disease)   . Migraine   . OAB (overactive bladder)   . Seasonal allergies    Past Surgical History:  Procedure Laterality Date  . ABDOMINAL HYSTERECTOMY     breast cancer  . ANKLE RECONSTRUCTION Right 1989  . BREAST SURGERY    . COLONOSCOPY WITH PROPOFOL N/A 08/06/2018   Procedure: COLONOSCOPY WITH  PROPOFOL;  Surgeon: Danie Binder, MD;  Location: [facility];  Service: Endoscopy;  Laterality: N/A;  1:30pm  . FOREIGN BODY REMOVAL N/A 08/29/2017   from lip  . KNEE SURGERY Right 1989   bone spur  . MASTECTOMY  2017   bil mastectomies  . SINUSOTOMY       SOCIAL HISTORY:  Social History   Socioeconomic History  . Marital status: Single    Spouse name: Not on file  . Number of children: 1  . Years of education: 44  . Highest education level: Not on file  Occupational History  . Occupation: disabled  Social Needs  . Financial resource strain: Somewhat hard  . Food insecurity:    Worry: Sometimes true    Inability: Sometimes true  . Transportation needs:    Medical: No    Non-medical: No  Tobacco Use  . Smoking status: Never Smoker  . Smokeless tobacco: Never Used  Substance and Sexual Activity  . Alcohol use: No  . Drug use: No  . Sexual activity: Not Currently  Lifestyle  . Physical activity:    Days per week: 0 days    Minutes per session: 0 min  . Stress: Rather much  Relationships  . Social connections:    Talks on phone: Once a week    Gets together: Once a week    Attends religious service: 1 to 4 times per year    Active member of club or organization: Yes    Attends meetings of  clubs or organizations: Never    Relationship status: Never married  . Intimate partner violence:    Fear of current or ex partner: No    Emotionally abused: No    Physically abused: No    Forced sexual activity: No  Other Topics Concern  . Not on file  Social History Narrative   Bachelors degree   Accounting   Lives with daughter [name] who has autism and diabetes   Likes to sew, quilt, crafts, crochet    FAMILY HISTORY:  Family History  Problem Relation Age of Onset  . Arthritis Mother   . COPD Mother   . Depression Mother   . Diabetes Mother   . Kidney disease Father   . Heart disease Father 33  . Drug abuse Father   . Hypertension Father   . Diabetes  Daughter   . Hashimoto's thyroiditis Daughter   . Irritable bowel syndrome Daughter   . Autism spectrum disorder Daughter   . Early death Maternal Grandmother        drowned  . Early death Maternal Grandfather   . Heart disease Maternal Grandfather   . Heart disease Paternal Grandfather   . Breast cancer Maternal Aunt   . Breast cancer Cousin   . Lung cancer Maternal Aunt   . Lung cancer Maternal Uncle   . Colon cancer Neg Hx     CURRENT MEDICATIONS:  Outpatient Encounter Medications as of 09/17/2018  Medication Sig Note  . Cholecalciferol (VITAMIN D3 GUMMIES) 25 MCG (1000 UT) CHEW Chew 2,000-4,000 Units by mouth daily.   Marland Kitchen docusate sodium (COLACE) 100 MG capsule Take 1 capsule (100 mg total) by mouth 2 (two) times daily as needed for mild constipation.   Marland Kitchen escitalopram (LEXAPRO) 20 MG tablet Take 1 tablet (20 mg total) by mouth daily.   . fulvestrant (FASLODEX) 250 MG/5ML injection Inject 250 mg into the muscle every 30 (thirty) days. One injection each buttock over 1-2 minutes. Warm prior to use. 09/09/2018: Received on 08/28/2018  . gabapentin (NEURONTIN) 300 MG capsule Take 600 mg by mouth at bedtime.    Marland Kitchen HYDROmorphone (DILAUDID) 2 MG tablet Take 1-2 tablets (2-4 mg total) by mouth every 6 (six) hours as needed for severe pain.   Marland Kitchen lactulose (CHRONULAC) 10 GM/15ML solution Take 30 mLs (20 g total) by mouth 2 (two) times daily as needed for mild constipation.   . lidocaine (LIDODERM) 5 % Place 1 patch onto the skin daily. Remove & Discard patch within 12 hours or as directed by MD   . naproxen sodium (ALEVE) 220 MG tablet Take 220-440 mg by mouth 2 (two) times daily as needed (for pain or headache).    . ondansetron (ZOFRAN ODT) 8 MG disintegrating tablet Take 1 tablet (8 mg total) by mouth every 8 (eight) hours as needed for nausea or vomiting.   . pantoprazole (PROTONIX) 40 MG tablet TAKE 1 TABLET BY MOUTH DAILY BEFORE BREAKFAST (Patient taking differently: Take 40 mg by mouth daily  before breakfast. )   . pilocarpine (SALAGEN) 5 MG tablet Take 5 mg by mouth 3 (three) times daily.   . Polyvinyl Alcohol-Povidone PF (REFRESH) 1.4-0.6 % SOLN Place 1 drop into both eyes 2 (two) times daily.    . rizatriptan (MAXALT-MLT) 10 MG disintegrating tablet Take 10 mg by mouth every 2 (two) hours as needed for migraine.    . traMADol (ULTRAM) 50 MG tablet Take 1 tablet (50 mg total) by mouth 2 (two) times daily.   Marland Kitchen  TROKENDI XR 50 MG CP24 Take 50 mg by mouth 2 (two) times daily.   . [DISCONTINUED] HYDROmorphone (DILAUDID) 2 MG tablet Take 1-2 tablets (2-4 mg total) by mouth every 6 (six) hours as needed for severe pain.   . [DISCONTINUED] ondansetron (ZOFRAN ODT) 8 MG disintegrating tablet Take 1 tablet (8 mg total) by mouth every 8 (eight) hours as needed for nausea or vomiting.    No facility-administered encounter medications on file as of 09/17/2018.     ALLERGIES:  Allergies  Allergen Reactions  . Corn-Containing Products Other (See Comments)    Headache and GI upset  . Tape Itching and Other (See Comments)    Depending on the adhesive-blistering occurs  . Amoxicillin Hives and Other (See Comments)    Rash only DID THE REACTION INVOLVE: Swelling of the face/tongue/throat, SOB, or low BP? Sudden or severe rash/hives, skin peeling, or the inside of the mouth or nose?  Did it require medical treatment?  When did it last happen? If all above answers are "NO", may proceed with cephalosporin use.   . Caffeine Diarrhea, Nausea Only, Palpitations and Other (See Comments)    Headache  . Tetanus Toxoids Swelling and Other (See Comments)    Local reaction     PHYSICAL EXAM:  ECOG Performance status: 1  Vitals:   09/17/18 0915  BP: 124/79  Pulse: (!) 121  Resp: 16  Temp: 98.4 F (36.9 C)  SpO2: 98%   Filed Weights   09/17/18 0915  Weight: 190 lb 6.4 oz (86.4 kg)    Physical Exam Vitals signs reviewed.  Constitutional:      Appearance: Normal appearance.   Eyes:     General: Scleral icterus present.  Cardiovascular:     Rate and Rhythm: Normal rate and regular rhythm.  Pulmonary:     Effort: Pulmonary effort is normal.     Breath sounds: Normal breath sounds.  Abdominal:     General: There is distension.     Palpations: Abdomen is soft.     Tenderness: There is abdominal tenderness.  Musculoskeletal:        General: No swelling.  Skin:    General: Skin is warm.  Neurological:     General: No focal deficit present.     Mental Status: She is alert and oriented to person, place, and time.  Psychiatric:        Mood and Affect: Mood normal.        Behavior: Behavior normal.      LABORATORY DATA:  I have reviewed the labs as listed.  CBC    Component Value Date/Time   WBC 8.4 09/14/2018 1351   RBC 3.47 (L) 09/14/2018 1351   HGB 10.6 (L) 09/14/2018 1351   HCT 32.2 (L) 09/14/2018 1351   PLT 202 09/14/2018 1351   MCV 92.8 09/14/2018 1351   MCH 30.5 09/14/2018 1351   MCHC 32.9 09/14/2018 1351   RDW 18.7 (H) 09/14/2018 1351   LYMPHSABS 1.2 09/14/2018 1351   MONOABS 0.8 09/14/2018 1351   EOSABS 0.2 09/14/2018 1351   BASOSABS 0.2 (H) 09/14/2018 1351   CMP Latest Ref Rng & Units 09/14/2018 09/10/2018 09/09/2018  Glucose 70 - 99 mg/dL 114(H) 89 97  BUN 6 - 20 mg/dL 21(H) 15 16  Creatinine 0.44 - 1.00 mg/dL 0.96 0.87 0.93  Sodium 135 - 145 mmol/L 138 140 138  Potassium 3.5 - 5.1 mmol/L 3.4(L) 3.5 3.5  Chloride 98 - 111 mmol/L 103 106 103  CO2  22 - 32 mmol/L 23 21(L) 22  Calcium 8.9 - 10.3 mg/dL 9.9 9.4 9.8  Total Protein 6.5 - 8.1 g/dL 7.9 7.4 8.3(H)  Total Bilirubin 0.3 - 1.2 mg/dL 7.3(H) 4.7(H) 5.0(H)  Alkaline Phos 38 - 126 U/L 169(H) 116 129(H)  AST 15 - 41 U/L <5(L) 287(H) 302(H)  ALT 0 - 44 U/L 104(H) 75(H) 82(H)       DIAGNOSTIC IMAGING:  I have independently reviewed the scans and discussed with the patient.   I have reviewed Jene Every, RN's note and agree with the documentation.  I personally performed a  face-to-face visit, made revisions and my assessment and plan is as follows.    ASSESSMENT & PLAN:   Malignant neoplasm of central portion of left breast in female, estrogen receptor positive ([place]) 1.  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 -Underwent neoadjuvant chemotherapy with dose dense AC followed by Taxol from 07/14/2015 through 11/29/2015. - Left lumpectomy plus SLNB, consistent with infiltrating lobular carcinoma, pleomorphic features, ER/PR positive, HER-2 negative, 0 out of 3 lymph nodes positive. - Zoladex 3.6 mg IM monthly started in August 2017 followed by prophylactic right breast mastectomy plus completion of left breast simple mastectomy, radiation not indicated. -Zoladex stopped due to joint pain, restarted in October 2017, exemestane started in January 2018 - Exemestane and Zoladex held secondary to joint pains, TAH and BSO in April 2018, exemestane 25 mg daily -Moved to [place] from [place] in March 2018, and was seeing Dr. Zenovia Jordan at [place]. -Genetic testing with multicolor gene panel was negative. -In August 2018 she was switched to tamoxifen 20 mg daily.  In November 2019 she was switched to anastrozole and she took only for few weeks. -Patient started having epigastric and right upper quadrant pains in mid December 2019. -CT CAP on 06/17/2018 shows very large hepatic mass measuring 22 cm, mildly enlarged left internal mammary node at 0.8 cm, borderline enlarged left lower neck level 4 node at 0.9 cm. - Biopsy of the liver mass on 06/26/2018 showed breast cancer, ER/PR positive and HER-2 negative. -Brain MRI in January 2020 was also negative for metastatic disease. -Faslodex started on 07/02/2018 Abemaciclib 150 mg twice daily started on 07/07/2018.   -PET CT scan on 07/13/2018 shows very large centrally necrotic mass  involving the right and left hepatic lobes, one other hypermetabolic lesion in the left lobe.  Multiple hypermetabolic lymph nodes in the upper abdomen, left supraclavicular space.  No thoracic adenopathy, chest wall mass or pulmonary or osseous lesions. - CT PE protocol on 08/03/2018 did not demonstrate any pulmonary embolus.  No edema or consolidation.  Stable subcentimeter left supraclavicular lymph nodes.   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  No pathological fracture or epidural extension.  Borderline to mild degenerative spinal stenosis at T11 and T12. - She went to the ER on 09/07/2018 with worsening pain.  CT CAP on the same day was negative for pulmonary embolism.  Prominent bilateral hilar lymph nodes were seen.  Extensive malignancy in the liver.  There is new satellite nodules when compared to 06/17/2018 CT of the abdomen. - I discussed the results of the ultrasound of the abdomen dated 09/15/2018 which shows hepatic metastasis, multiple foci.  No intrahepatic ductal dilatation. -Her direct bilirubin is elevated at 5.3.  She reports that  her constipation and pain has improved.  Itching also improved since last visit 2 days ago. -I had a prolonged discussion with the patient about the terminal nature of her malignancy.  Because of her hyper bilirubinemia, she is not a candidate for many chemotherapeutic agents. -I have reached out to my colleague at [facility] Dr. Janese Banks and talked to her about this patient.  She has kindly agreed to do a WebEx consult on this patient next Tuesday.  She has also recommended weekly paclitaxel at a lower dose with addition of Xeloda during week 2. -I have called and talked to the patient about this recommendation.  She is agreeable to start therapy tomorrow.  We talked about the side effects in detail.  2.  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 -Right upper  quadrant ultrasound did not show any biliary dilatation.  We will obtain MRCP to see if she will be a candidate for percutaneous biliary drain.  3.  Right upper quadrant pain: -She is continuing Dilaudid 1 to 2 tablets every 6 hours as needed. -She is using lactulose for constipation.         Orders placed this encounter:  No orders of the defined types were placed in this encounter.     Derek Jack, MD [place] [phone]

## 2018-09-17 NOTE — Assessment & Plan Note (Addendum)
1.  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 -Underwent neoadjuvant chemotherapy with dose dense AC followed by Taxol from 07/14/2015 through 11/29/2015. - Left lumpectomy plus SLNB, consistent with infiltrating lobular carcinoma, pleomorphic features, ER/PR positive, HER-2 negative, 0 out of 3 lymph nodes positive. - Zoladex 3.6 mg IM monthly started in August 2017 followed by prophylactic right breast mastectomy plus completion of left breast simple mastectomy, radiation not indicated. -Zoladex stopped due to joint pain, restarted in October 2017, exemestane started in January 2018 - Exemestane and Zoladex held secondary to joint pains, TAH and BSO in April 2018, exemestane 25 mg daily -Moved to [place] from [place] in March 2018, and was seeing Dr. Zenovia Jordan at [place]. -Genetic testing with multicolor gene panel was negative. -In August 2018 she was switched to tamoxifen 20 mg daily.  In November 2019 she was switched to anastrozole and she took only for few weeks. -Patient started having epigastric and right upper quadrant pains in mid December 2019. -CT CAP on 06/17/2018 shows very large hepatic mass measuring 22 cm, mildly enlarged left internal mammary node at 0.8 cm, borderline enlarged left lower neck level 4 node at 0.9 cm. - Biopsy of the liver mass on 06/26/2018 showed breast cancer, ER/PR positive and HER-2 negative. -Brain MRI in January 2020 was also negative for metastatic disease. -Faslodex started on 07/02/2018 Abemaciclib 150 mg twice daily started on 07/07/2018.   -PET CT scan on 07/13/2018 shows very large centrally necrotic mass involving the right and left hepatic lobes, one other hypermetabolic lesion in the left lobe.  Multiple hypermetabolic lymph nodes in the upper abdomen, left supraclavicular space.  No thoracic adenopathy,  chest wall mass or pulmonary or osseous lesions. - CT PE protocol on 08/03/2018 did not demonstrate any pulmonary embolus.  No edema or consolidation.  Stable subcentimeter left supraclavicular lymph nodes.   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  No pathological fracture or epidural extension.  Borderline to mild degenerative spinal stenosis at T11 and T12. - She went to the ER on 09/07/2018 with worsening pain.  CT CAP on the same day was negative for pulmonary embolism.  Prominent bilateral hilar lymph nodes were seen.  Extensive malignancy in the liver.  There is new satellite nodules when compared to 06/17/2018 CT of the abdomen. - I discussed the results of the ultrasound of the abdomen dated 09/15/2018 which shows hepatic metastasis, multiple foci.  No intrahepatic ductal dilatation. -Her direct bilirubin is elevated at 5.3.  She reports that her constipation and pain has improved.  Itching also improved since last visit 2 days ago. -I had a prolonged discussion with the patient about the terminal nature of her malignancy.  Because of her hyper bilirubinemia, she is not a candidate for many chemotherapeutic agents. -I have reached out to my colleague at [facility] Dr. Janese Banks and talked to her about this patient.  She has kindly agreed to do a WebEx consult on this patient next Tuesday.  She has also recommended weekly paclitaxel at a lower dose with addition of Xeloda during week 2. -I have called and talked to the patient about this recommendation.  She is agreeable to start therapy tomorrow.  We talked about the side effects in detail.  2.  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 -  Right upper quadrant ultrasound did not show any biliary dilatation.  We will obtain MRCP to see if she will be a candidate for percutaneous biliary drain.  3.  Right upper quadrant pain: -She is continuing Dilaudid 1 to 2  tablets every 6 hours as needed. -She is using lactulose for constipation.

## 2018-09-18 ENCOUNTER — Other Ambulatory Visit (HOSPITAL_COMMUNITY): Payer: Self-pay | Admitting: Hematology

## 2018-09-18 ENCOUNTER — Ambulatory Visit (HOSPITAL_COMMUNITY)
Admission: RE | Admit: 2018-09-18 | Discharge: 2018-09-18 | Disposition: A | Discharge status: Home / Self Care | Payer: 59 | Source: Ambulatory Visit | Attending: Hematology | Admitting: Hematology

## 2018-09-18 ENCOUNTER — Inpatient Hospital Stay (HOSPITAL_COMMUNITY): Payer: 59

## 2018-09-18 ENCOUNTER — Encounter (HOSPITAL_COMMUNITY): Payer: Self-pay

## 2018-09-18 ENCOUNTER — Other Ambulatory Visit: Payer: Self-pay

## 2018-09-18 VITALS — BP 131/68 | HR 101 | Temp 98.1°F | Resp 18 | Wt 189.8 lb

## 2018-09-18 DIAGNOSIS — T80219A Unspecified infection due to central venous catheter, initial encounter: Secondary | ICD-10-CM | POA: Diagnosis present

## 2018-09-18 DIAGNOSIS — C50112 Malignant neoplasm of central portion of left female breast: Secondary | ICD-10-CM | POA: Insufficient documentation

## 2018-09-18 DIAGNOSIS — Z170 Estrogen receptor positive status [ER+]: Principal | ICD-10-CM

## 2018-09-18 DIAGNOSIS — C50919 Malignant neoplasm of unspecified site of unspecified female breast: Secondary | ICD-10-CM

## 2018-09-18 DIAGNOSIS — C50111 Malignant neoplasm of central portion of right female breast: Secondary | ICD-10-CM | POA: Diagnosis not present

## 2018-09-18 LAB — COMPREHENSIVE METABOLIC PANEL
ALT: 128 U/L — ABNORMAL HIGH (ref 0–44)
AST: 404 U/L — ABNORMAL HIGH (ref 15–41)
Albumin: 3 g/dL — ABNORMAL LOW (ref 3.5–5.0)
Alkaline Phosphatase: 201 U/L — ABNORMAL HIGH (ref 38–126)
Anion gap: 12 (ref 5–15)
BUN: 20 mg/dL (ref 6–20)
CO2: 25 mmol/L (ref 22–32)
Calcium: 9.8 mg/dL (ref 8.9–10.3)
Chloride: 99 mmol/L (ref 98–111)
Creatinine, Ser: 0.77 mg/dL (ref 0.44–1.00)
GFR calc Af Amer: >60 mL/min (ref >60)
GFR calc non Af Amer: >60 mL/min (ref >60)
Glucose, Bld: 127 mg/dL — ABNORMAL HIGH (ref 70–99)
Potassium: 3.6 mmol/L (ref 3.5–5.1)
Sodium: 136 mmol/L (ref 135–145)
Total Bilirubin: 10.6 mg/dL — ABNORMAL HIGH (ref 0.3–1.2)
Total Protein: 7.6 g/dL (ref 6.5–8.1)

## 2018-09-18 LAB — CBC WITH DIFFERENTIAL/PLATELET
Abs Immature Granulocytes: 0.05 10*3/uL (ref 0.00–0.07)
Basophils Absolute: 0.1 10*3/uL (ref 0.0–0.1)
Basophils Relative: 2 %
Eosinophils Absolute: 0.1 10*3/uL (ref 0.0–0.5)
Eosinophils Relative: 1 %
HCT: 30.6 % — ABNORMAL LOW (ref 36.0–46.0)
Hemoglobin: 10.4 g/dL — ABNORMAL LOW (ref 12.0–15.0)
Immature Granulocytes: 1 %
Lymphocytes Relative: 9 %
Lymphs Abs: 0.8 10*3/uL (ref 0.7–4.0)
MCH: 31.1 pg (ref 26.0–34.0)
MCHC: 34 g/dL (ref 30.0–36.0)
MCV: 91.6 fL (ref 80.0–100.0)
Monocytes Absolute: 0.9 10*3/uL (ref 0.1–1.0)
Monocytes Relative: 10 %
Neutro Abs: 7.2 10*3/uL (ref 1.7–7.7)
Neutrophils Relative %: 77 %
Platelets: 186 10*3/uL (ref 150–400)
RBC: 3.34 MIL/uL — ABNORMAL LOW (ref 3.87–5.11)
RDW: 19.2 % — ABNORMAL HIGH (ref 11.5–15.5)
WBC: 9.3 10*3/uL (ref 4.0–10.5)
nRBC: 0 % (ref 0.0–0.2)

## 2018-09-18 LAB — BILIRUBIN, DIRECT: Bilirubin, Direct: 6.9 mg/dL — ABNORMAL HIGH (ref 0.0–0.2)

## 2018-09-18 IMAGING — CR PORTABLE CHEST - 1 VIEW
1 series · 1 of 1 positions shown · non-contrast
Comparison: CT chest [DATE].

CLINICAL DATA: Status post right PICC placement today.

EXAM:
PORTABLE CHEST 1 VIEW

[portable]
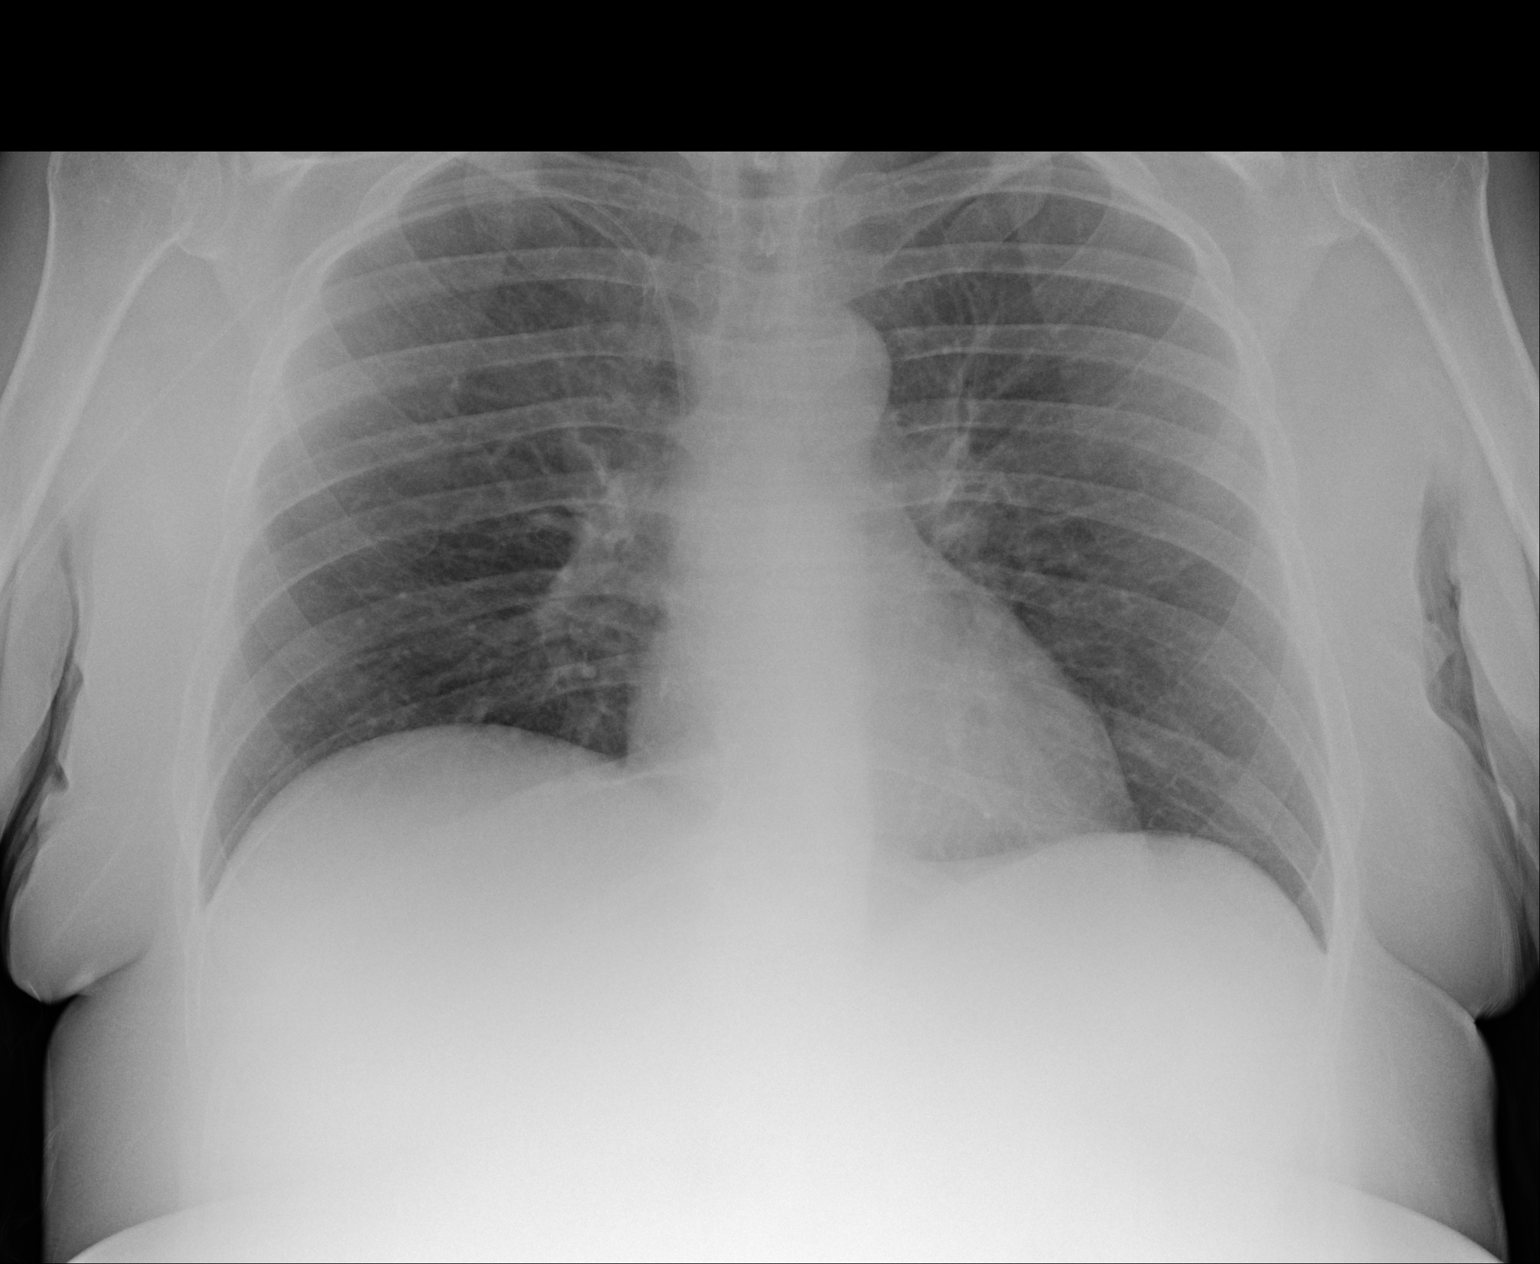

[1 of 1 positions shown; findings below may reference images not displayed]

FINDINGS: Right PICC is in place with the tip projecting in the mid superior
vena cava. Lungs are clear. Heart size is normal. No pneumothorax or
pleural fluid. No acute or focal bony abnormality.
IMPRESSION: Right PICC projects in good position.  No acute disease.

## 2018-09-18 IMAGING — MR MR 3D RECON AT SCANNER
20 series · 20 of 20 positions shown · IV contrast (gadavist)
Comparison: [DATE] CT abdomen/pelvis.  [DATE] PET-CT.

CLINICAL DATA: Metastatic left breast cancer with biopsy-proven
liver metastasis diagnosed [DATE]. Hyperbilirubinemia. Ongoing
medical therapy.

EXAM:
MRI ABDOMEN WITHOUT AND WITH CONTRAST (INCLUDING MRCP)
TECHNIQUE: Multiplanar multisequence MR imaging of the abdomen was performed
both before and after the administration of intravenous contrast.
Heavily T2-weighted images of the biliary and pancreatic ducts were
obtained, and three-dimensional MRCP images were rendered by post
processing.
CONTRAST:  9 cc Gadavist IV.

[Series 3: T2 · coronal · 5.0mm · 1.18mm/px · 1 of 38 slices shown (1 of 3)]
[im 1/38]
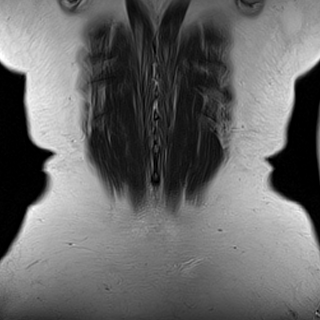

[Series 4: t2fs axial blade · axial · 4.5mm · 1.20mm/px · 1 of 64 slices shown]
[im 1/64]
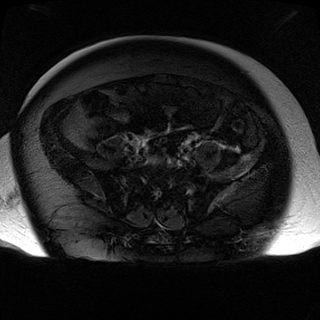

[Series 7: T2 · coronal · 1.0mm · 0.39mm/px · 1 of 104 slices shown (2 of 3)]
[im 1/104]
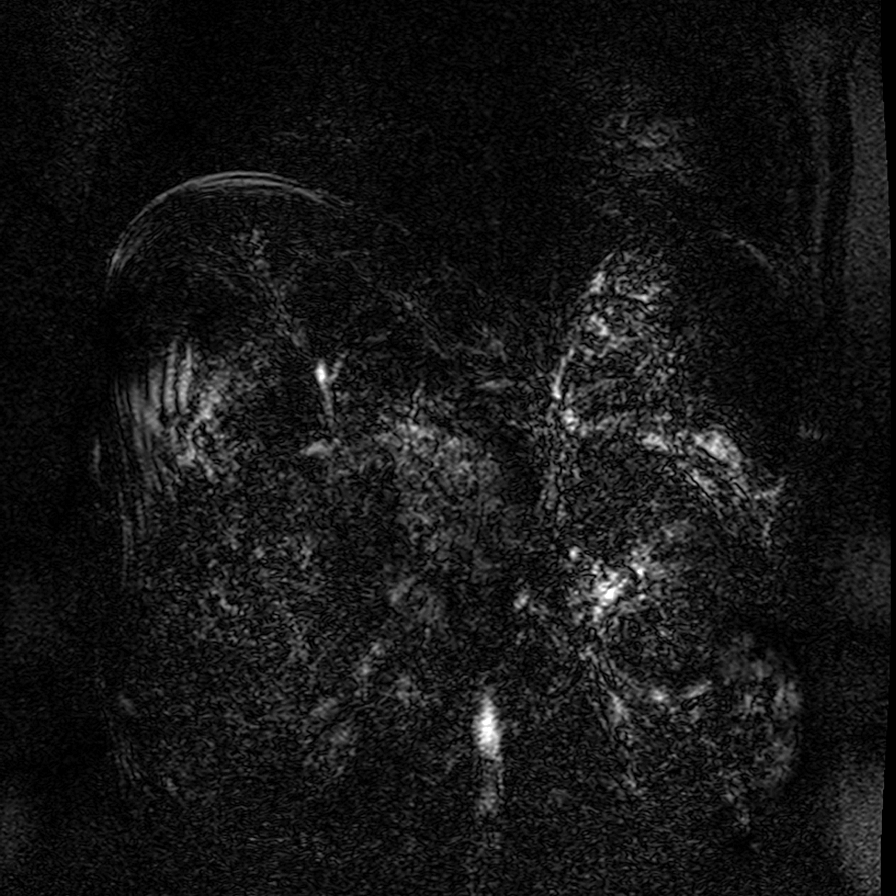

[Series 9: DWI · axial · 5.0mm · 1.03mm/px · 1 of 100 slices shown (1 of 2)]
[im 1/100]
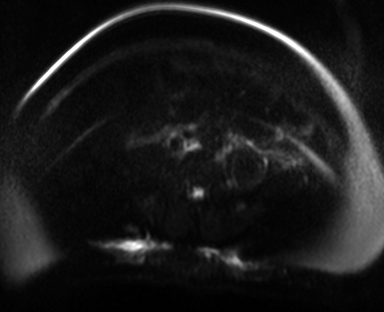

[Series 10: DWI · axial · 5.0mm · 1.03mm/px · 1 of 50 slices shown (2 of 2)]
[im 1/50]
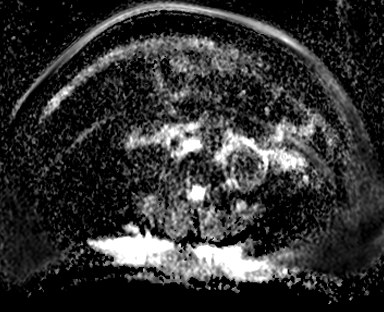

[Series 12: bSSFP · axial · 4.5mm · 1.49mm/px · 1 of 74 slices shown]
[im 1/74]
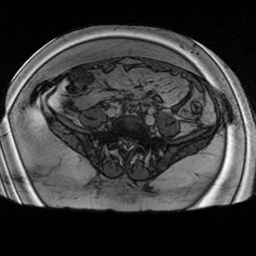

[Series 13: T1 · axial · 4.0mm · 0.59mm/px · 1 of 72 slices shown (1 of 2)]
[im 1/72]
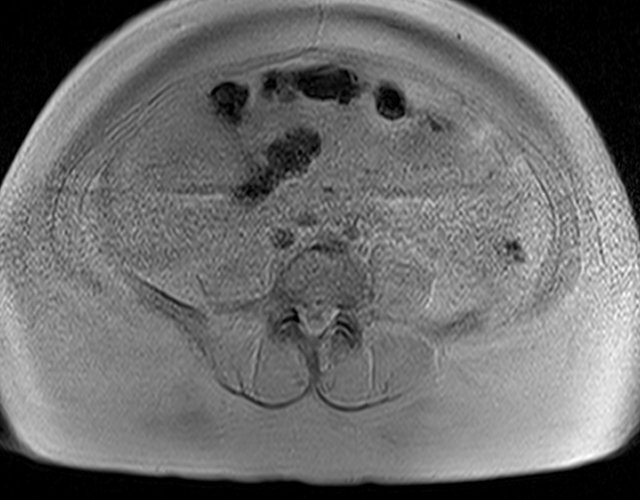

[Series 14: T1 · axial · 4.0mm · 0.59mm/px · 1 of 72 slices shown (2 of 2)]
[im 1/72]
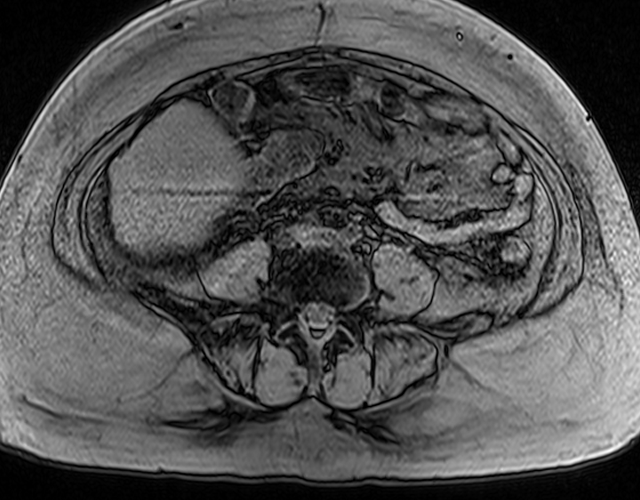

[Series 25: t1fs coronal post · coronal · 2.5mm · 1.22mm/px · 1 of 96 slices shown]
[im 1/96]
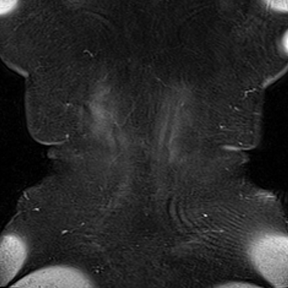

[Series 26: T2 · axial · 5.0mm · 1.48mm/px · 1 of 50 slices shown (3 of 3)]
[im 1/50]
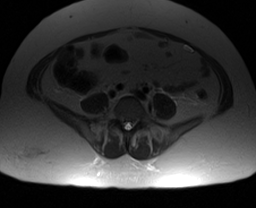

[Series 27: 5 min delay · axial · delayed · 4.0mm · 1.34mm/px · 1 of 72 slices shown]
[im 1/72]
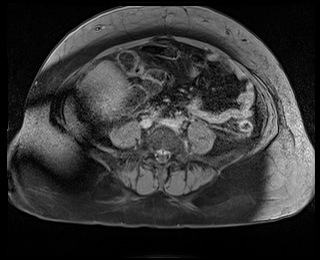

[Series 28: 5 min delay_sub · axial · 4.0mm · 1.34mm/px · 1 of 72 slices shown]
[im 1/72]
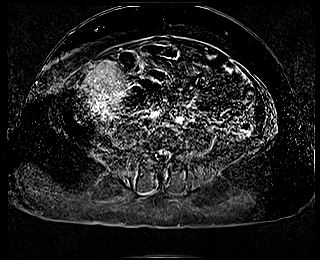

[Series 5004: pre · axial · non-contrast · 4.0mm · 1.21mm/px · 1 of 72 slices shown]
[im 1/72]
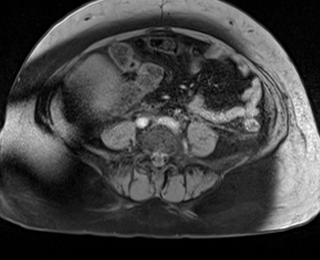

[Series 5005: 23 sec · axial · 4.0mm · 1.21mm/px · 1 of 72 slices shown]
[im 1/72]
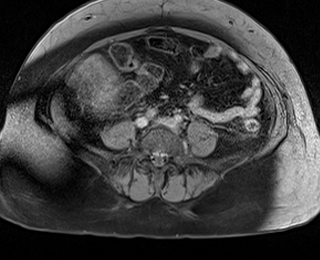

[Series 5006: 45 sec · axial · 4.0mm · 1.21mm/px · 1 of 72 slices shown]
[im 1/72]
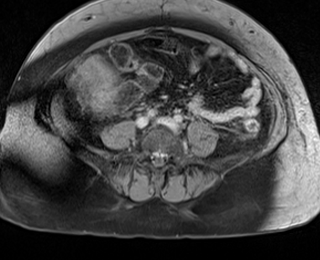

[Series 5007: 90 sec · axial · 4.0mm · 1.21mm/px · 1 of 72 slices shown]
[im 1/72]
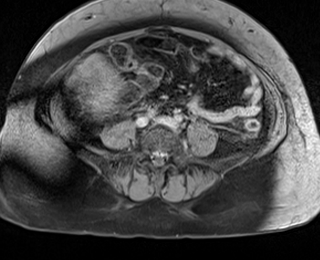

[Series 5008: 23 sec delay · axial · delayed · 4.0mm · 1.21mm/px · 1 of 72 slices shown]
[im 1/72]
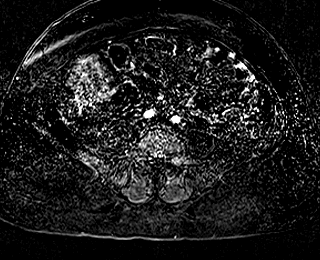

[Series 5009: 45 sec delay · axial · delayed · 4.0mm · 1.21mm/px · 1 of 72 slices shown]
[im 1/72]
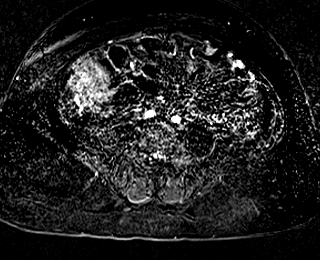

[Series 5010: 90 sec delay · axial · delayed · 4.0mm · 1.21mm/px · 1 of 72 slices shown]
[im 1/72]
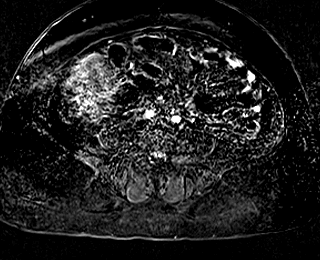

[Series 5012: <mip range>_fil_1 · sagittal · 0.33mm/px · 1 of 3 slices shown]
[im 1/3]
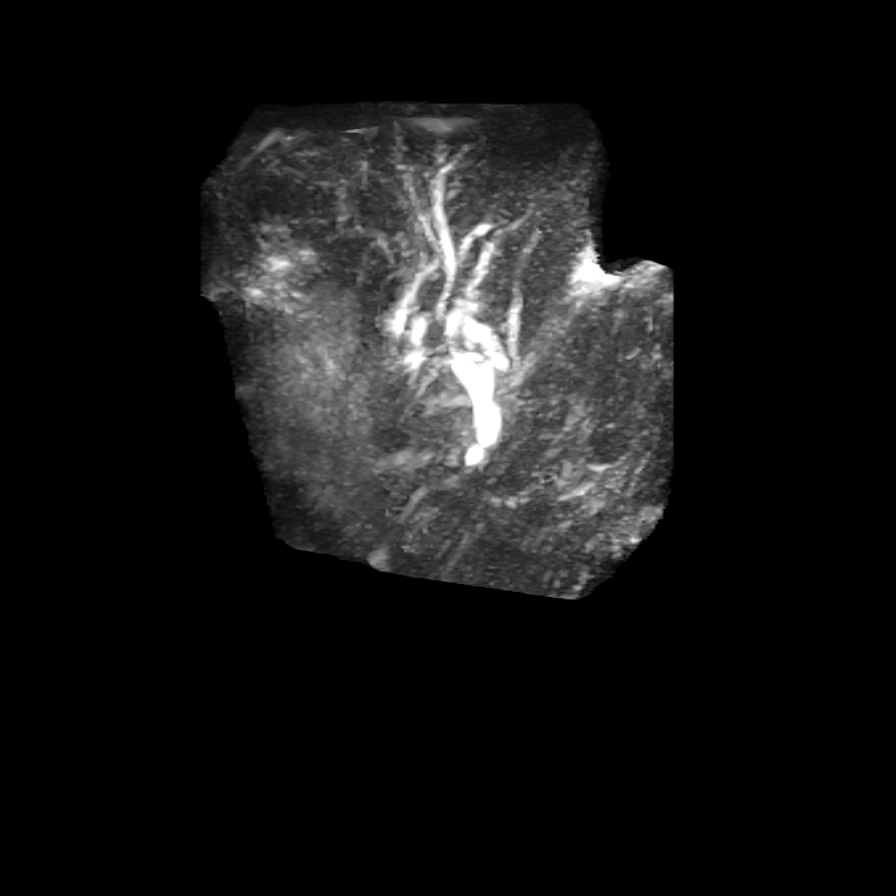

[20 of 20 positions shown; findings below may reference images not displayed]

FINDINGS: Lower chest: No acute abnormality at the lung bases.

Hepatobiliary: The liver is enlarged by numerous (greater than 20)
avidly heterogeneously enhancing liver masses. The dominant large
22.8 x 12.4 cm inferior liver mass (series [19]/image 53) measured
22.9 x 11.4 cm on [DATE] CT, unchanged. A few additional enhancing
liver masses appear unchanged since [DATE] CT, with a
representative 2.2 x 1.6 cm mass at the left liver dome (series
506/image 17), previously 2.1 x 1.4 cm. Several smaller enhancing
liver masses were not discretely visualized on [DATE] CT, such
as a 1.5 x 1.3 cm segment 7 right liver lobe mass (series [19]/image
28). Mild diffuse hepatic steatosis. Gallbladder is collapsed with
no gallstones or definite gallbladder wall thickening. There is
scattered mild to moderate intrahepatic biliary ductal dilatation in
the right and left liver lobes, which appears new. There is new
dilatation of proximal common bile duct, measuring 10 mm diameter.
There is an abrupt caliber transition of the CBD at the level of the
proximal CBD, with collapsed nonvisualized distal CBD. No evidence
of filling defects within the bile ducts.

Pancreas: No pancreatic mass or duct dilation.  No pancreas divisum.

Spleen: Normal size. No mass.

Adrenals/Urinary Tract: Normal adrenals. No hydronephrosis. Normal
kidneys with no renal mass.

Stomach/Bowel: Normal non-distended stomach. Visualized small and
large bowel is normal caliber, with no bowel wall thickening.

Vascular/Lymphatic: Normal caliber abdominal aorta. Patent portal,
splenic, hepatic and renal veins. Mild left para-aortic
lymphadenopathy measuring up to 1.0 cm (series [19]/image 48),
stable.

Other: No abdominal ascites or focal fluid collection.

Musculoskeletal: Enhancing 1.1 cm L4 vertebral lesion (series
25/image 42), not appreciated on prior scans.
IMPRESSION: 1. Bulky liver metastatic disease, clearly progressed since
[DATE] PET-CT, stable to mildly progressed since [DATE] CT.
2. New extrinsic malignant biliary stricture at the level of the
proximal common bile duct, with new scattered mild-to-moderate
intrahepatic biliary ductal dilatation in both liver lobes.
3. Mild left para-aortic lymphadenopathy, stable.
4. Small enhancing L4 vertebral lesion, not appreciated on prior
scans, cannot exclude new bone metastasis.
5. Mild diffuse hepatic steatosis.

## 2018-09-18 MED ORDER — HYDROMORPHONE HCL 4 MG/ML IJ SOLN: 2.0000 mg | Freq: Once | INTRAMUSCULAR | Status: DC

## 2018-09-18 MED ORDER — SODIUM CHLORIDE 0.9 % IV SOLN
10.0000 mg | Freq: Once | INTRAVENOUS | Status: AC
  Administered 2018-09-18: 09:00:00 10 mg via INTRAVENOUS
  Filled 2018-09-18: qty 1

## 2018-09-18 MED ORDER — DEXAMETHASONE SODIUM PHOSPHATE 10 MG/ML IJ SOLN
INTRAMUSCULAR | Status: AC
  Filled 2018-09-18: qty 1

## 2018-09-18 MED ORDER — HEPARIN SOD (PORK) LOCK FLUSH 100 UNIT/ML IV SOLN
INTRAVENOUS | Status: AC
  Filled 2018-09-18: qty 5

## 2018-09-18 MED ORDER — HEPARIN SOD (PORK) LOCK FLUSH 100 UNIT/ML IV SOLN
250.0000 [IU] | Freq: Once | INTRAVENOUS | Status: AC | PRN
  Administered 2018-09-18: 250 [IU]

## 2018-09-18 MED ORDER — DIPHENHYDRAMINE HCL 50 MG/ML IJ SOLN
25.0000 mg | Freq: Once | INTRAMUSCULAR | Status: AC
  Administered 2018-09-18: 09:00:00 25 mg via INTRAVENOUS

## 2018-09-18 MED ORDER — GADOBUTROL 1 MMOL/ML IV SOLN
9.0000 mL | Freq: Once | INTRAVENOUS | Status: AC | PRN
  Administered 2018-09-18: 17:00:00 9 mL via INTRAVENOUS

## 2018-09-18 MED ORDER — SODIUM CHLORIDE 0.9% FLUSH
10.0000 mL | INTRAVENOUS | Status: DC | PRN
  Administered 2018-09-18: 10 mL
  Filled 2018-09-18: qty 10

## 2018-09-18 MED ORDER — DIPHENHYDRAMINE HCL 50 MG/ML IJ SOLN
INTRAMUSCULAR | Status: AC
  Filled 2018-09-18: qty 1

## 2018-09-18 MED ORDER — HYDROMORPHONE HCL 1 MG/ML IJ SOLN
INTRAMUSCULAR | Status: AC
  Filled 2018-09-18: qty 1

## 2018-09-18 MED ORDER — SODIUM CHLORIDE 0.9 % IV SOLN
Freq: Once | INTRAVENOUS | Status: AC
  Administered 2018-09-18: 09:00:00 via INTRAVENOUS

## 2018-09-18 MED ORDER — HYDROMORPHONE HCL 1 MG/ML IJ SOLN
2.0000 mg | Freq: Once | INTRAMUSCULAR | Status: AC
  Administered 2018-09-18: 09:00:00 2 mg via INTRAVENOUS

## 2018-09-18 MED ORDER — FAMOTIDINE IN NACL 20-0.9 MG/50ML-% IV SOLN
INTRAVENOUS | Status: AC
  Filled 2018-09-18: qty 50

## 2018-09-18 MED ORDER — FAMOTIDINE IN NACL 20-0.9 MG/50ML-% IV SOLN
20.0000 mg | Freq: Once | INTRAVENOUS | Status: AC
  Administered 2018-09-18: 09:00:00 20 mg via INTRAVENOUS

## 2018-09-18 MED ORDER — PROCHLORPERAZINE MALEATE 10 MG PO TABS: 10.0000 mg | ORAL_TABLET | Freq: Four times a day (QID) | ORAL | 1 refills | Status: DC | PRN

## 2018-09-18 MED ORDER — SODIUM CHLORIDE 0.9 % IV SOLN
60.0000 mg/m2 | Freq: Once | INTRAVENOUS | Status: AC
  Administered 2018-09-18: 120 mg via INTRAVENOUS
  Filled 2018-09-18: qty 20

## 2018-09-18 NOTE — Progress Notes (Signed)
Patient was provided with a case of ensure to help her as this is all she can eat/drink currently.  Patient tolerated treatment without incidence.  She was discharged via wheelchair in stable condition.

## 2018-09-18 NOTE — Progress Notes (Signed)
Patient arrived to cancer center this morning with her daughter. She is in a bit of pain. She states that she has upper abdominal discomfort/bloating/full feeling.  She said that her pain regimen at home has not helped her in the past few days.  She reports overall not feeling well today.  She is here for chemotherapy administration.   IV team is at bedside at this time.  They are going to insert PICC line prior to chemotherapy administration today.

## 2018-09-18 NOTE — Patient Instructions (Signed)
You were here today for chemotherapy administration.  We inserted a PICC line.  This will need to be flushed twice a week and you will need a dressing change once weekly.  We will take care of that for you.  We have you scheduled for Monday at 1130 to come back and have labs and PICC line flushed.

## 2018-09-18 NOTE — Progress Notes (Signed)
Dr. [name] aware of HR 103, AST 404 , ALT 128, direct bilirubin 6.9. Okay to proceed with treatment.

## 2018-09-18 NOTE — Progress Notes (Signed)
Picc line insertion done, stat portable chest xray done. Will begin premeds and pain medications per MD.

## 2018-09-21 ENCOUNTER — Other Ambulatory Visit (INDEPENDENT_AMBULATORY_CARE_PROVIDER_SITE_OTHER): Payer: Self-pay | Admitting: Internal Medicine

## 2018-09-21 ENCOUNTER — Encounter (HOSPITAL_COMMUNITY): Payer: 59

## 2018-09-21 ENCOUNTER — Inpatient Hospital Stay (HOSPITAL_BASED_OUTPATIENT_CLINIC_OR_DEPARTMENT_OTHER): Payer: 59 | Admitting: Hematology

## 2018-09-21 ENCOUNTER — Encounter (HOSPITAL_COMMUNITY): Payer: Self-pay | Admitting: Hematology

## 2018-09-21 ENCOUNTER — Inpatient Hospital Stay (HOSPITAL_COMMUNITY): Payer: 59

## 2018-09-21 ENCOUNTER — Other Ambulatory Visit (HOSPITAL_COMMUNITY): Payer: Self-pay | Admitting: *Deleted

## 2018-09-21 ENCOUNTER — Other Ambulatory Visit: Payer: Self-pay

## 2018-09-21 ENCOUNTER — Encounter (HOSPITAL_COMMUNITY): Payer: Self-pay

## 2018-09-21 VITALS — Temp 98.8°F | Wt 193.2 lb

## 2018-09-21 VITALS — BP 128/82 | HR 98 | Temp 98.7°F | Resp 18

## 2018-09-21 DIAGNOSIS — C50112 Malignant neoplasm of central portion of left female breast: Secondary | ICD-10-CM | POA: Diagnosis not present

## 2018-09-21 DIAGNOSIS — M2550 Pain in unspecified joint: Secondary | ICD-10-CM | POA: Diagnosis not present

## 2018-09-21 DIAGNOSIS — C50919 Malignant neoplasm of unspecified site of unspecified female breast: Secondary | ICD-10-CM

## 2018-09-21 DIAGNOSIS — K831 Obstruction of bile duct: Secondary | ICD-10-CM

## 2018-09-21 DIAGNOSIS — R1011 Right upper quadrant pain: Secondary | ICD-10-CM

## 2018-09-21 DIAGNOSIS — Z86711 Personal history of pulmonary embolism: Secondary | ICD-10-CM

## 2018-09-21 DIAGNOSIS — Z9011 Acquired absence of right breast and nipple: Secondary | ICD-10-CM

## 2018-09-21 DIAGNOSIS — Z5111 Encounter for antineoplastic chemotherapy: Secondary | ICD-10-CM | POA: Diagnosis not present

## 2018-09-21 DIAGNOSIS — Z170 Estrogen receptor positive status [ER+]: Secondary | ICD-10-CM

## 2018-09-21 DIAGNOSIS — Z90722 Acquired absence of ovaries, bilateral: Secondary | ICD-10-CM

## 2018-09-21 DIAGNOSIS — C50111 Malignant neoplasm of central portion of right female breast: Secondary | ICD-10-CM | POA: Diagnosis not present

## 2018-09-21 DIAGNOSIS — M549 Dorsalgia, unspecified: Secondary | ICD-10-CM

## 2018-09-21 DIAGNOSIS — Z90710 Acquired absence of both cervix and uterus: Secondary | ICD-10-CM

## 2018-09-21 DIAGNOSIS — Z452 Encounter for adjustment and management of vascular access device: Secondary | ICD-10-CM

## 2018-09-21 LAB — CBC WITH DIFFERENTIAL/PLATELET
Abs Immature Granulocytes: 0.01 10*3/uL (ref 0.00–0.07)
Basophils Absolute: 0.1 10*3/uL (ref 0.0–0.1)
Basophils Relative: 1 %
Eosinophils Absolute: 0.1 10*3/uL (ref 0.0–0.5)
Eosinophils Relative: 1 %
HCT: 30 % — ABNORMAL LOW (ref 36.0–46.0)
Hemoglobin: 10 g/dL — ABNORMAL LOW (ref 12.0–15.0)
Immature Granulocytes: 0 %
Lymphocytes Relative: 6 %
Lymphs Abs: 0.4 10*3/uL — ABNORMAL LOW (ref 0.7–4.0)
MCH: 31 pg (ref 26.0–34.0)
MCHC: 33.3 g/dL (ref 30.0–36.0)
MCV: 92.9 fL (ref 80.0–100.0)
Monocytes Absolute: 0.1 10*3/uL (ref 0.1–1.0)
Monocytes Relative: 1 %
Neutro Abs: 6.1 10*3/uL (ref 1.7–7.7)
Neutrophils Relative %: 91 %
Platelets: 169 10*3/uL (ref 150–400)
RBC: 3.23 MIL/uL — ABNORMAL LOW (ref 3.87–5.11)
RDW: 18.5 % — ABNORMAL HIGH (ref 11.5–15.5)
WBC: 6.8 10*3/uL (ref 4.0–10.5)
nRBC: 0 % (ref 0.0–0.2)

## 2018-09-21 LAB — COMPREHENSIVE METABOLIC PANEL
ALT: 150 U/L — ABNORMAL HIGH (ref 0–44)
AST: 700 U/L — ABNORMAL HIGH (ref 15–41)
Albumin: 2.8 g/dL — ABNORMAL LOW (ref 3.5–5.0)
Alkaline Phosphatase: 214 U/L — ABNORMAL HIGH (ref 38–126)
Anion gap: 12 (ref 5–15)
BUN: 22 mg/dL — ABNORMAL HIGH (ref 6–20)
CO2: 25 mmol/L (ref 22–32)
Calcium: 9.3 mg/dL (ref 8.9–10.3)
Chloride: 97 mmol/L — ABNORMAL LOW (ref 98–111)
Creatinine, Ser: 0.52 mg/dL (ref 0.44–1.00)
GFR calc Af Amer: >60 mL/min (ref >60)
GFR calc non Af Amer: >60 mL/min (ref >60)
Glucose, Bld: 113 mg/dL — ABNORMAL HIGH (ref 70–99)
Potassium: 3.8 mmol/L (ref 3.5–5.1)
Sodium: 134 mmol/L — ABNORMAL LOW (ref 135–145)
Total Bilirubin: 14.2 mg/dL — ABNORMAL HIGH (ref 0.3–1.2)
Total Protein: 7.3 g/dL (ref 6.5–8.1)

## 2018-09-21 LAB — BILIRUBIN, DIRECT: Bilirubin, Direct: 8.6 mg/dL — ABNORMAL HIGH (ref 0.0–0.2)

## 2018-09-21 MED ORDER — SODIUM CHLORIDE 0.9 % IV SOLN
INTRAVENOUS | Status: DC
  Administered 2018-09-21: 13:00:00 via INTRAVENOUS

## 2018-09-21 MED ORDER — SODIUM CHLORIDE 0.9% FLUSH
20.0000 mL | INTRAVENOUS | Status: DC | PRN
  Administered 2018-09-21: 12:00:00 20 mL via INTRAVENOUS
  Filled 2018-09-21: qty 20

## 2018-09-21 MED ORDER — HEPARIN SOD (PORK) LOCK FLUSH 100 UNIT/ML IV SOLN
250.0000 [IU] | Freq: Once | INTRAVENOUS | Status: AC
  Administered 2018-09-21: 12:00:00 250 [IU] via INTRAVENOUS

## 2018-09-21 MED ORDER — SODIUM CHLORIDE 0.9 % IV SOLN: INTRAVENOUS | Status: DC

## 2018-09-21 NOTE — Progress Notes (Signed)
[facility], [place] [id number]   CLINIC:  Medical Oncology/Hematology  PCP:  Doree Albee, MD [place] [id number] [phone]   REASON FOR VISIT:  Follow-up for Metastatic breast cancer to the liver  CURRENT THERAPY:Paclitaxel weekly started on 09/18/2018.    BRIEF ONCOLOGIC HISTORY:    Metastatic breast cancer ([place])   07/02/2018 Initial Diagnosis    Metastatic breast cancer ([place])    09/18/2018 -  Chemotherapy    The patient had PACLitaxel (TAXOL) 120 mg in sodium chloride 0.9 % 250 mL chemo infusion (</= 37m/m2), 60 mg/m2 = 120 mg (100 % of original dose 60 mg/m2), Intravenous,  Once, 1 of 4 cycles Dose modification: 60 mg/m2 (original dose 60 mg/m2, Cycle 1, Reason: Change in LFTs) Administration: 120 mg (09/18/2018)  for chemotherapy treatment.       CANCER STAGING: Cancer Staging No matching staging information was found for the patient.   INTERVAL HISTORY:  Ms. NMattice41y.o. female returns for routine follow-up. She is here today alone. She states that she is still having abdominal and back pain. She states that she has trouble swallowing as well as nausea and constipation. Denies any nausea, vomiting, or diarrhea. Denies any new pains. Had not noticed any recent bleeding such as epistaxis, hematuria or hematochezia. Denies recent chest pain on exertion, shortness of breath on minimal exertion, pre-syncopal episodes, or palpitations. Denies any numbness or tingling in hands or feet. Denies any recent fevers, infections, or recent hospitalizations. Patient reports appetite at 75% and energy level at 25%.     REVIEW OF SYSTEMS:  Review of Systems  HENT:   Positive for trouble swallowing.   Gastrointestinal: Positive for abdominal pain and constipation.  Psychiatric/Behavioral: Positive for sleep disturbance.  All other systems reviewed and are negative.    PAST MEDICAL/SURGICAL HISTORY:  Past Medical  History:  Diagnosis Date  . Anemia   . Anxiety   . Asthma   . Breast cancer ([place]    Left, 2017  . Depression   . GERD (gastroesophageal reflux disease)   . Migraine   . OAB (overactive bladder)   . Seasonal allergies    Past Surgical History:  Procedure Laterality Date  . ABDOMINAL HYSTERECTOMY     breast cancer  . ANKLE RECONSTRUCTION Right 1989  . BREAST SURGERY    . COLONOSCOPY WITH PROPOFOL N/A 08/06/2018   Procedure: COLONOSCOPY WITH PROPOFOL;  Surgeon: FDanie Binder MD;  Location: [facility];  Service: Endoscopy;  Laterality: N/A;  1:30pm  . FOREIGN BODY REMOVAL N/A 08/29/2017   from lip  . KNEE SURGERY Right 1989   bone spur  . MASTECTOMY  2017   bil mastectomies  . SINUSOTOMY       SOCIAL HISTORY:  Social History   Socioeconomic History  . Marital status: Single    Spouse name: Not on file  . Number of children: 1  . Years of education: 147 . Highest education level: Not on file  Occupational History  . Occupation: disabled  Social Needs  . Financial resource strain: Somewhat hard  . Food insecurity:    Worry: Sometimes true    Inability: Sometimes true  . Transportation needs:    Medical: No    Non-medical: No  Tobacco Use  . Smoking status: Never Smoker  . Smokeless tobacco: Never Used  Substance and Sexual Activity  . Alcohol use: No  . Drug use: No  .  Sexual activity: Not Currently  Lifestyle  . Physical activity:    Days per week: 0 days    Minutes per session: 0 min  . Stress: Rather much  Relationships  . Social connections:    Talks on phone: Once a week    Gets together: Once a week    Attends religious service: 1 to 4 times per year    Active member of club or organization: Yes    Attends meetings of clubs or organizations: Never    Relationship status: Never married  . Intimate partner violence:    Fear of current or ex partner: No    Emotionally abused: No    Physically abused: No    Forced sexual activity: No  Other  Topics Concern  . Not on file  Social History Narrative   Bachelors degree   Accounting   Lives with daughter [name] who has autism and diabetes   Likes to sew, quilt, crafts, crochet    FAMILY HISTORY:  Family History  Problem Relation Age of Onset  . Arthritis Mother   . COPD Mother   . Depression Mother   . Diabetes Mother   . Kidney disease Father   . Heart disease Father 65  . Drug abuse Father   . Hypertension Father   . Diabetes Daughter   . Hashimoto's thyroiditis Daughter   . Irritable bowel syndrome Daughter   . Autism spectrum disorder Daughter   . Early death Maternal Grandmother        drowned  . Early death Maternal Grandfather   . Heart disease Maternal Grandfather   . Heart disease Paternal Grandfather   . Breast cancer Maternal Aunt   . Breast cancer Cousin   . Lung cancer Maternal Aunt   . Lung cancer Maternal Uncle   . Colon cancer Neg Hx     CURRENT MEDICATIONS:  Outpatient Encounter Medications as of 09/21/2018  Medication Sig Note  . Cholecalciferol (VITAMIN D3 GUMMIES) 25 MCG (1000 UT) CHEW Chew 2,000-4,000 Units by mouth daily.   Marland Kitchen docusate sodium (COLACE) 100 MG capsule Take 1 capsule (100 mg total) by mouth 2 (two) times daily as needed for mild constipation.   Marland Kitchen escitalopram (LEXAPRO) 20 MG tablet Take 1 tablet (20 mg total) by mouth daily.   . fulvestrant (FASLODEX) 250 MG/5ML injection Inject 250 mg into the muscle every 30 (thirty) days. One injection each buttock over 1-2 minutes. Warm prior to use. 09/09/2018: Received on 08/28/2018  . gabapentin (NEURONTIN) 300 MG capsule Take 600 mg by mouth at bedtime.    Marland Kitchen HYDROmorphone (DILAUDID) 2 MG tablet Take 1-2 tablets (2-4 mg total) by mouth every 6 (six) hours as needed for severe pain.   Marland Kitchen lactulose (CHRONULAC) 10 GM/15ML solution Take 30 mLs (20 g total) by mouth 2 (two) times daily as needed for mild constipation.   . lidocaine (LIDODERM) 5 % Place 1 patch onto the skin daily. Remove & Discard  patch within 12 hours or as directed by MD   . naproxen sodium (ALEVE) 220 MG tablet Take 220-440 mg by mouth 2 (two) times daily as needed (for pain or headache).    . ondansetron (ZOFRAN ODT) 8 MG disintegrating tablet Take 1 tablet (8 mg total) by mouth every 8 (eight) hours as needed for nausea or vomiting.   Marland Kitchen PACLitaxel (TAXOL IV) Inject into the vein.   . pantoprazole (PROTONIX) 40 MG tablet TAKE 1 TABLET BY MOUTH DAILY BEFORE BREAKFAST (Patient taking  differently: Take 40 mg by mouth daily before breakfast. )   . pilocarpine (SALAGEN) 5 MG tablet Take 5 mg by mouth 3 (three) times daily.   . Polyvinyl Alcohol-Povidone PF (REFRESH) 1.4-0.6 % SOLN Place 1 drop into both eyes 2 (two) times daily.    . prochlorperazine (COMPAZINE) 10 MG tablet Take 1 tablet (10 mg total) by mouth every 6 (six) hours as needed for nausea or vomiting.   . rizatriptan (MAXALT-MLT) 10 MG disintegrating tablet Take 10 mg by mouth every 2 (two) hours as needed for migraine.    . traMADol (ULTRAM) 50 MG tablet Take 1 tablet (50 mg total) by mouth 2 (two) times daily.   Marland Kitchen TROKENDI XR 50 MG CP24 Take 50 mg by mouth 2 (two) times daily.    Facility-Administered Encounter Medications as of 09/21/2018  Medication  . 0.9 %  sodium chloride infusion  . [COMPLETED] heparin lock flush 100 unit/mL  . [DISCONTINUED] sodium chloride flush (NS) 0.9 % injection 20 mL    ALLERGIES:  Allergies  Allergen Reactions  . Corn-Containing Products Other (See Comments)    Headache and GI upset  . Tape Itching and Other (See Comments)    Depending on the adhesive-blistering occurs  . Amoxicillin Hives and Other (See Comments)    Rash only DID THE REACTION INVOLVE: Swelling of the face/tongue/throat, SOB, or low BP? Sudden or severe rash/hives, skin peeling, or the inside of the mouth or nose?  Did it require medical treatment?  When did it last happen? If all above answers are "NO", may proceed with cephalosporin use.   .  Caffeine Diarrhea, Nausea Only, Palpitations and Other (See Comments)    Headache  . Tetanus Toxoids Swelling and Other (See Comments)    Local reaction     PHYSICAL EXAM:  ECOG Performance status: 2  Vitals:   09/21/18 1218  Temp: 98.8 F (37.1 C)  SpO2: 96%   Filed Weights   09/21/18 1218  Weight: 193 lb 3.2 oz (87.6 kg)    Physical Exam Vitals signs reviewed.  Constitutional:      Appearance: Normal appearance.  Cardiovascular:     Rate and Rhythm: Normal rate and regular rhythm.     Heart sounds: Normal heart sounds.  Pulmonary:     Effort: Pulmonary effort is normal.     Breath sounds: Normal breath sounds.  Abdominal:     Palpations: Abdomen is soft. There is no mass.     Tenderness: There is abdominal tenderness.  Musculoskeletal:        General: No swelling.  Skin:    General: Skin is warm.  Neurological:     Mental Status: She is alert.  Psychiatric:        Mood and Affect: Mood normal.        Behavior: Behavior normal.      LABORATORY DATA:  I have reviewed the labs as listed.  CBC    Component Value Date/Time   WBC 6.8 09/21/2018 1200   RBC 3.23 (L) 09/21/2018 1200   HGB 10.0 (L) 09/21/2018 1200   HCT 30.0 (L) 09/21/2018 1200   PLT 169 09/21/2018 1200   MCV 92.9 09/21/2018 1200   MCH 31.0 09/21/2018 1200   MCHC 33.3 09/21/2018 1200   RDW 18.5 (H) 09/21/2018 1200   LYMPHSABS 0.4 (L) 09/21/2018 1200   MONOABS 0.1 09/21/2018 1200   EOSABS 0.1 09/21/2018 1200   BASOSABS 0.1 09/21/2018 1200   CMP Latest Ref Rng &  Units 09/21/2018 09/18/2018 09/14/2018  Glucose 70 - 99 mg/dL 113(H) 127(H) 114(H)  BUN 6 - 20 mg/dL 22(H) 20 21(H)  Creatinine 0.44 - 1.00 mg/dL 0.52 0.77 0.96  Sodium 135 - 145 mmol/L 134(L) 136 138  Potassium 3.5 - 5.1 mmol/L 3.8 3.6 3.4(L)  Chloride 98 - 111 mmol/L 97(L) 99 103  CO2 22 - 32 mmol/L '25 25 23  ' Calcium 8.9 - 10.3 mg/dL 9.3 9.8 9.9  Total Protein 6.5 - 8.1 g/dL 7.3 7.6 7.9  Total Bilirubin 0.3 - 1.2 mg/dL 14.2(H)  10.6(H) 7.3(H)  Alkaline Phos 38 - 126 U/L 214(H) 201(H) 169(H)  AST 15 - 41 U/L 700(H) 404(H) <5(L)  ALT 0 - 44 U/L 150(H) 128(H) 104(H)       DIAGNOSTIC IMAGING:  I have independently reviewed the scans and discussed with the patient.   I have reviewed Venita Lick LPN's note and agree with the documentation.  I personally performed a face-to-face visit, made revisions and my assessment and plan is as follows.    ASSESSMENT & PLAN:   Malignant neoplasm of central portion of left breast in female, estrogen receptor positive ([place]) 1.  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 -Underwent neoadjuvant chemotherapy with dose dense AC followed by Taxol from 07/14/2015 through 11/29/2015. - Left lumpectomy plus SLNB, consistent with infiltrating lobular carcinoma, pleomorphic features, ER/PR positive, HER-2 negative, 0 out of 3 lymph nodes positive. - Zoladex 3.6 mg IM monthly started in August 2017 followed by prophylactic right breast mastectomy plus completion of left breast simple mastectomy, radiation not indicated. -Zoladex stopped due to joint pain, restarted in October 2017, exemestane started in January 2018 - Exemestane and Zoladex held secondary to joint pains, TAH and BSO in April 2018, exemestane 25 mg daily -Moved to [place] from [place] in March 2018, and was seeing Dr. Zenovia Jordan at [place]. -Genetic testing with multicolor gene panel was negative. -In August 2018 she was switched to tamoxifen 20 mg daily.  In November 2019 she was switched to anastrozole and she took only for few weeks. -Patient started having epigastric and right upper quadrant pains in mid December 2019. -CT CAP on 06/17/2018 shows very large hepatic mass measuring 22 cm, mildly enlarged left internal mammary node at 0.8 cm, borderline enlarged left lower  neck level 4 node at 0.9 cm. - Biopsy of the liver mass on 06/26/2018 showed breast cancer, ER/PR positive and HER-2 negative. -Brain MRI in January 2020 was also negative for metastatic disease. -Faslodex started on 07/02/2018 Abemaciclib 150 mg twice daily started on 07/07/2018 and discontinued on 09/07/2018.   -PET CT scan on 07/13/2018 shows very large centrally necrotic mass involving the right and left hepatic lobes, one other hypermetabolic lesion in the left lobe.  Multiple hypermetabolic lymph nodes in the upper abdomen, left supraclavicular space.  No thoracic adenopathy, chest wall mass or pulmonary or osseous lesions. - CT PE protocol on 08/03/2018 did not demonstrate any pulmonary embolus.  No edema or consolidation.  Stable subcentimeter left supraclavicular lymph nodes.   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  No pathological fracture or epidural extension.  Borderline to mild degenerative spinal stenosis at T11 and T12. - She went to the ER on 09/07/2018 with worsening pain.  CT CAP on the same day was negative for pulmonary  embolism.  Prominent bilateral hilar lymph nodes were seen.  Extensive malignancy in the liver.  There is new satellite nodules when compared to 06/17/2018 CT of the abdomen. - MRCP on 09/18/2018 shows bulky liver metastatic disease, with progression since 07/13/2018 PET CT scan.  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 -She did receive first cycle of paclitaxel on 09/18/2018 at a dose of 60 mg/m.  She tolerated it reasonably well. -She has a virtual consultation with Dr. Janese Banks at [facility] tomorrow. - She has a follow-up appointment to see me on Friday.  I plan to repeat labs on that day.  2.  Hyperbilirubinemia: -Her bilirubin further increased to 14.2, direct competent is around 8. - I  reviewed results of the MRCP dated 09/18/2018 which shows new extrinsic malignant biliary stricture at the level of the proximal common bile duct with new scattered mild to moderate intrahepatic biliary ductal dilatation in both liver lobes. - I have reached out to [facility] who kindly agreed to do ERCP and stent placement tomorrow.  3.  Right upper quadrant pain: -She is taking Dilaudid 1 to 2 tablets every 6 hours as needed which is helping. -She is using lactulose for constipation.     She will receive 1 L of IV fluids as she is dehydrated. Total time spent is 40 minutes with more than 50% of the time spent face-to-face discussing scan results, treatment recommendations and coordination of care.  Orders placed this encounter:  No orders of the defined types were placed in this encounter.     Derek Jack, MD [place] [phone]

## 2018-09-21 NOTE — Addendum Note (Signed)
Addended by: Donnie Aho on: 09/21/2018 12:58 PM   Modules accepted: Orders

## 2018-09-21 NOTE — Patient Instructions (Signed)
[place] at [facility] Discharge Instructions  Receivd hydration today. Follow-up as scheduled. Call clinic for any questions or  concerns   Thank you for choosing [place] at [facility] to provide your oncology and hematology care.  To afford each patient quality time with our provider, please arrive at least 15 minutes before your scheduled appointment time.   If you have a lab appointment with the [facility] please come in thru the  Main Entrance and check in at the main information desk  You need to re-schedule your appointment should you arrive 10 or more minutes late.  We strive to give you quality time with our providers, and arriving late affects you and other patients whose appointments are after yours.  Also, if you no show three or more times for appointments you may be dismissed from the clinic at the providers discretion.     Again, thank you for choosing [facility].  Our hope is that these requests will decrease the amount of time that you wait before being seen by our physicians.       _____________________________________________________________  Should you have questions after your visit to [facility], please contact our office at [phone] between the hours of 8:00 a.m. and 4:30 p.m.  Voicemails left after 4:00 p.m. will not be returned until the following business day.  For prescription refill requests, have your pharmacy contact our office and allow 72 hours.    Cancer Center Support Programs:   > Cancer Support Group  2nd Tuesday of the month 1pm-2pm, Journey Room

## 2018-09-21 NOTE — Progress Notes (Signed)
Pamela Long tolerated hydration well without complaints or incident. VSS upon discharge. Pt discharged via wheelchair in satisfactory condition

## 2018-09-21 NOTE — Patient Instructions (Addendum)
[place] at [facility] Discharge Instructions  You were seen today by Dr. [name]. He went over your recent labs and MRCP results. He spoke with Dr. Laural Golden about doing an ERCP to help with your Bilirubin. He will see you back on Friday.   Thank you for choosing [place] at [facility] to provide your oncology and hematology care.  To afford each patient quality time with our provider, please arrive at least 15 minutes before your scheduled appointment time.   If you have a lab appointment with the [facility] please come in thru the  Main Entrance and check in at the main information desk  You need to re-schedule your appointment should you arrive 10 or more minutes late.  We strive to give you quality time with our providers, and arriving late affects you and other patients whose appointments are after yours.  Also, if you no show three or more times for appointments you may be dismissed from the clinic at the providers discretion.     Again, thank you for choosing [facility].  Our hope is that these requests will decrease the amount of time that you wait before being seen by our physicians.       _____________________________________________________________  Should you have questions after your visit to [facility], please contact our office at [phone] between the hours of 8:00 a.m. and 4:30 p.m.  Voicemails left after 4:00 p.m. will not be returned until the following business day.  For prescription refill requests, have your pharmacy contact our office and allow 72 hours.    Cancer Center Support Programs:   > Cancer Support Group  2nd Tuesday of the month 1pm-2pm, Journey Room

## 2018-09-21 NOTE — Assessment & Plan Note (Signed)
1.  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 -Underwent neoadjuvant chemotherapy with dose dense AC followed by Taxol from 07/14/2015 through 11/29/2015. - Left lumpectomy plus SLNB, consistent with infiltrating lobular carcinoma, pleomorphic features, ER/PR positive, HER-2 negative, 0 out of 3 lymph nodes positive. - Zoladex 3.6 mg IM monthly started in August 2017 followed by prophylactic right breast mastectomy plus completion of left breast simple mastectomy, radiation not indicated. -Zoladex stopped due to joint pain, restarted in October 2017, exemestane started in January 2018 - Exemestane and Zoladex held secondary to joint pains, TAH and BSO in April 2018, exemestane 25 mg daily -Moved to [place] from [place] in March 2018, and was seeing Dr. Zenovia Jordan at [place]. -Genetic testing with multicolor gene panel was negative. -In August 2018 she was switched to tamoxifen 20 mg daily.  In November 2019 she was switched to anastrozole and she took only for few weeks. -Patient started having epigastric and right upper quadrant pains in mid December 2019. -CT CAP on 06/17/2018 shows very large hepatic mass measuring 22 cm, mildly enlarged left internal mammary node at 0.8 cm, borderline enlarged left lower neck level 4 node at 0.9 cm. - Biopsy of the liver mass on 06/26/2018 showed breast cancer, ER/PR positive and HER-2 negative. -Brain MRI in January 2020 was also negative for metastatic disease. -Faslodex started on 07/02/2018 Abemaciclib 150 mg twice daily started on 07/07/2018 and discontinued on 09/07/2018.   -PET CT scan on 07/13/2018 shows very large centrally necrotic mass involving the right and left hepatic lobes, one other hypermetabolic lesion in the left lobe.  Multiple hypermetabolic lymph nodes in the upper abdomen, left supraclavicular  space.  No thoracic adenopathy, chest wall mass or pulmonary or osseous lesions. - CT PE protocol on 08/03/2018 did not demonstrate any pulmonary embolus.  No edema or consolidation.  Stable subcentimeter left supraclavicular lymph nodes.   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  No pathological fracture or epidural extension.  Borderline to mild degenerative spinal stenosis at T11 and T12. - She went to the ER on 09/07/2018 with worsening pain.  CT CAP on the same day was negative for pulmonary embolism.  Prominent bilateral hilar lymph nodes were seen.  Extensive malignancy in the liver.  There is new satellite nodules when compared to 06/17/2018 CT of the abdomen. - MRCP on 09/18/2018 shows bulky liver metastatic disease, with progression since 07/13/2018 PET CT scan.  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 -She did receive first cycle of paclitaxel on 09/18/2018 at a dose of 60 mg/m.  She tolerated it reasonably well. -She has a virtual consultation with Dr. Janese Banks at [facility] tomorrow. - She has a follow-up appointment to see me on Friday.  I plan to repeat labs on that day.  2.  Hyperbilirubinemia: -Her bilirubin further increased to 14.2, direct competent is around 8. - I reviewed results of the MRCP dated 09/18/2018 which shows new extrinsic malignant biliary stricture at the level of the proximal common bile duct with new scattered mild to moderate intrahepatic biliary ductal dilatation in both liver lobes. - I have reached out to [place] [facility] who kindly agreed to do ERCP and stent placement tomorrow.  3.  Right upper quadrant pain: -She is taking Dilaudid 1 to 2 tablets every 6 hours as needed which is helping. -She is using lactulose for constipation.

## 2018-09-22 ENCOUNTER — Ambulatory Visit (HOSPITAL_COMMUNITY): Payer: 59 | Admitting: Anesthesiology

## 2018-09-22 ENCOUNTER — Encounter (HOSPITAL_COMMUNITY)
Admission: RE | Disposition: A | Discharge status: Home / Self Care | Payer: Self-pay | Source: Home / Self Care | Attending: Internal Medicine

## 2018-09-22 ENCOUNTER — Ambulatory Visit (HOSPITAL_COMMUNITY): Payer: 59

## 2018-09-22 ENCOUNTER — Other Ambulatory Visit: Payer: Self-pay

## 2018-09-22 ENCOUNTER — Encounter (HOSPITAL_COMMUNITY): Payer: Self-pay | Admitting: *Deleted

## 2018-09-22 ENCOUNTER — Ambulatory Visit (HOSPITAL_COMMUNITY)
Admission: RE | Admit: 2018-09-22 | Discharge: 2018-09-22 | Disposition: A | Discharge status: Home / Self Care | Payer: 59 | Attending: Internal Medicine | Admitting: Internal Medicine

## 2018-09-22 DIAGNOSIS — Z79891 Long term (current) use of opiate analgesic: Secondary | ICD-10-CM | POA: Insufficient documentation

## 2018-09-22 DIAGNOSIS — Z79818 Long term (current) use of other agents affecting estrogen receptors and estrogen levels: Secondary | ICD-10-CM | POA: Diagnosis not present

## 2018-09-22 DIAGNOSIS — Z9012 Acquired absence of left breast and nipple: Secondary | ICD-10-CM | POA: Diagnosis not present

## 2018-09-22 DIAGNOSIS — J45909 Unspecified asthma, uncomplicated: Secondary | ICD-10-CM | POA: Insufficient documentation

## 2018-09-22 DIAGNOSIS — Z880 Allergy status to penicillin: Secondary | ICD-10-CM | POA: Diagnosis not present

## 2018-09-22 DIAGNOSIS — K219 Gastro-esophageal reflux disease without esophagitis: Secondary | ICD-10-CM | POA: Insufficient documentation

## 2018-09-22 DIAGNOSIS — G43909 Migraine, unspecified, not intractable, without status migrainosus: Secondary | ICD-10-CM | POA: Diagnosis not present

## 2018-09-22 DIAGNOSIS — K831 Obstruction of bile duct: Secondary | ICD-10-CM | POA: Diagnosis not present

## 2018-09-22 DIAGNOSIS — Z9221 Personal history of antineoplastic chemotherapy: Secondary | ICD-10-CM | POA: Insufficient documentation

## 2018-09-22 DIAGNOSIS — Z887 Allergy status to serum and vaccine status: Secondary | ICD-10-CM | POA: Diagnosis not present

## 2018-09-22 DIAGNOSIS — Z79899 Other long term (current) drug therapy: Secondary | ICD-10-CM | POA: Diagnosis not present

## 2018-09-22 DIAGNOSIS — C787 Secondary malignant neoplasm of liver and intrahepatic bile duct: Secondary | ICD-10-CM | POA: Diagnosis not present

## 2018-09-22 DIAGNOSIS — Z888 Allergy status to other drugs, medicaments and biological substances status: Secondary | ICD-10-CM | POA: Insufficient documentation

## 2018-09-22 DIAGNOSIS — F418 Other specified anxiety disorders: Secondary | ICD-10-CM | POA: Diagnosis not present

## 2018-09-22 DIAGNOSIS — Z853 Personal history of malignant neoplasm of breast: Secondary | ICD-10-CM | POA: Diagnosis not present

## 2018-09-22 DIAGNOSIS — K91840 Postprocedural hemorrhage and hematoma of a digestive system organ or structure following a digestive system procedure: Secondary | ICD-10-CM

## 2018-09-22 HISTORY — PX: BILIARY STENT PLACEMENT: SHX5538

## 2018-09-22 HISTORY — PX: ERCP: SHX5425

## 2018-09-22 IMAGING — CR PORTABLE ABDOMEN - 1 VIEW
1 series · 1 of 1 positions shown · non-contrast
Comparison: [DATE], MR [DATE]
COMPARISON: [DATE], MR [DATE]

Addendum:
CLINICAL DATA: [age] old male with history 84 year CT

EXAM:
PORTABLE ABDOMEN - 1 VIEW

[supine ap]
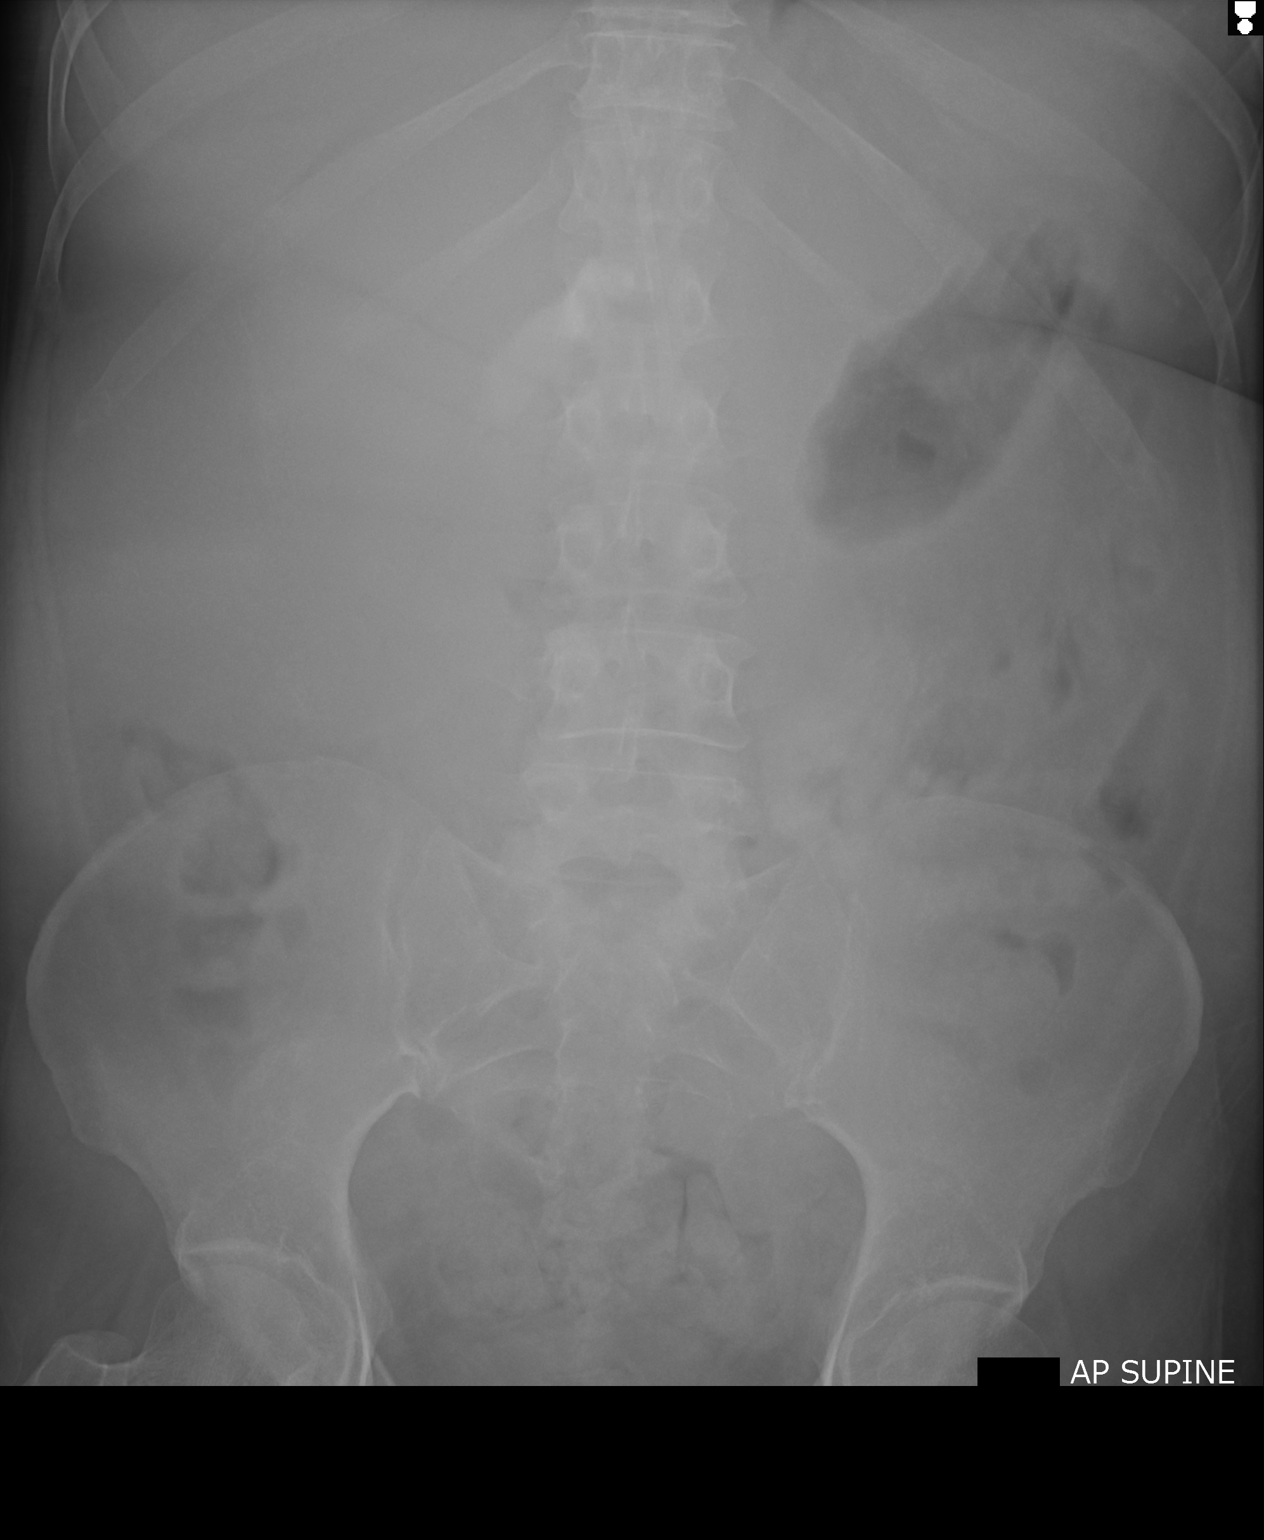

[1 of 1 positions shown; findings below may reference images not displayed]

FINDINGS: Plastic stent projects over the midline abdomen, centered at the
T12-L1 level.

Minimal degree of contrast retained within the extrahepatic biliary
system.

Gas within stomach, small bowel, colon without abnormal distention.

No displaced fracture.
IMPRESSION: Unremarkable bowel gas pattern.

Plastic biliary stent projects in the midline upper abdomen.

ADDENDUM:
Addendum created for the history

47-year-old female with obstructive jaundice and prior biliary stent
placement

*** End of Addendum ***
FINDINGS: Plastic stent projects over the midline abdomen, centered at the
T12-L1 level.

Minimal degree of contrast retained within the extrahepatic biliary
system.

Gas within stomach, small bowel, colon without abnormal distention.

No displaced fracture.
IMPRESSION: Unremarkable bowel gas pattern.

Plastic biliary stent projects in the midline upper abdomen.

## 2018-09-22 IMAGING — RF ERCP
1 series · 15 of 17 positions shown · non-contrast
Comparison: None.

CLINICAL DATA: Obstructive jaundice

EXAM:
ERCP
TECHNIQUE: Multiple spot images obtained with the fluoroscopic device and
submitted for interpretation post-procedure.
FLUOROSCOPY TIME:  Fluoroscopy Time:  1 minutes and 38 seconds
Radiation Exposure Index (if provided by the fluoroscopic device):
Number of Acquired Spot Images: 0

[Series 1: run · 6 acquisitions, 15 frames shown]
[im 1/6]
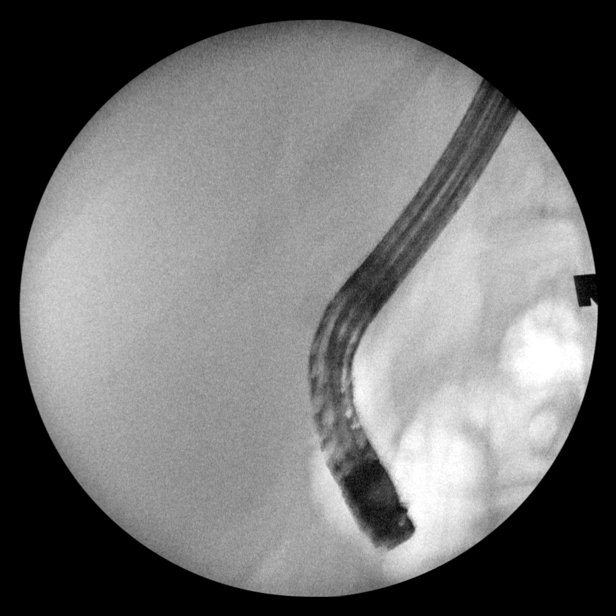
[im 2/6]
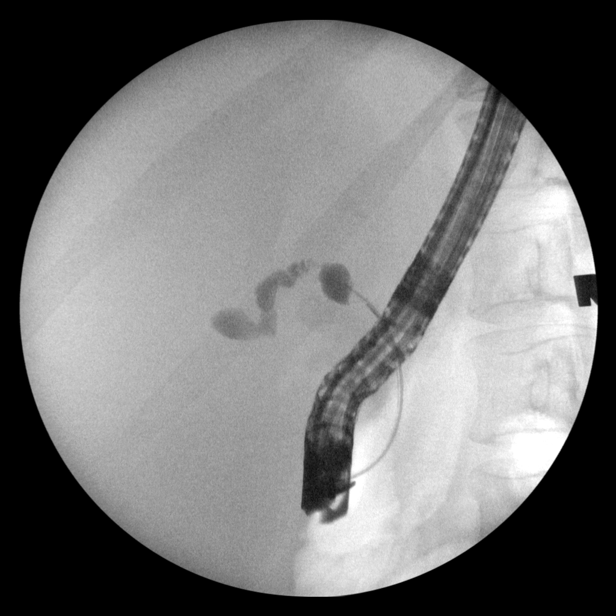
[im 2/6]
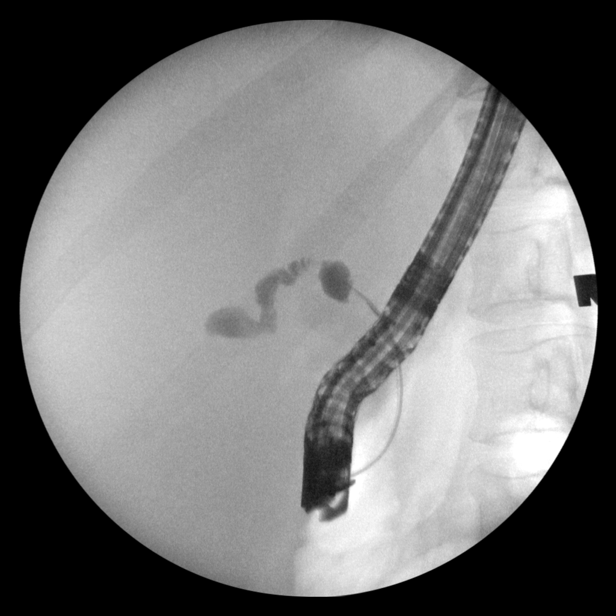
[im 2/6]
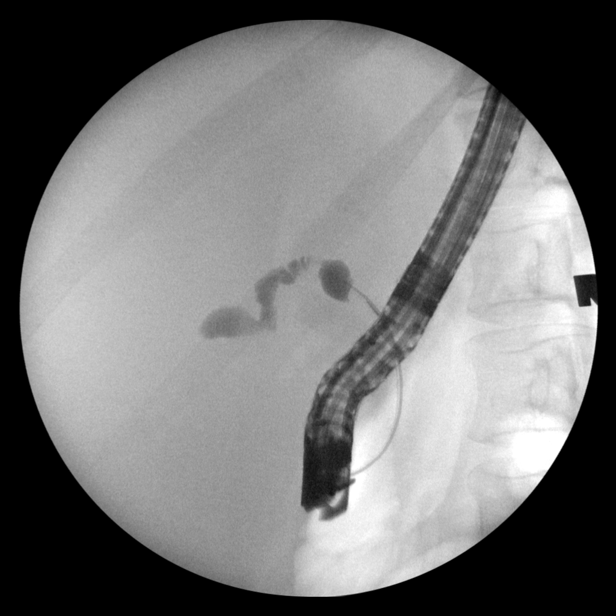
[im 3/6]
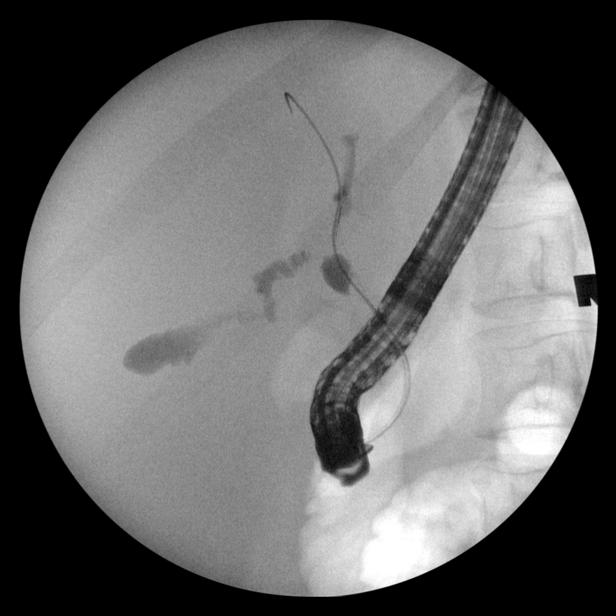
[im 3/6]
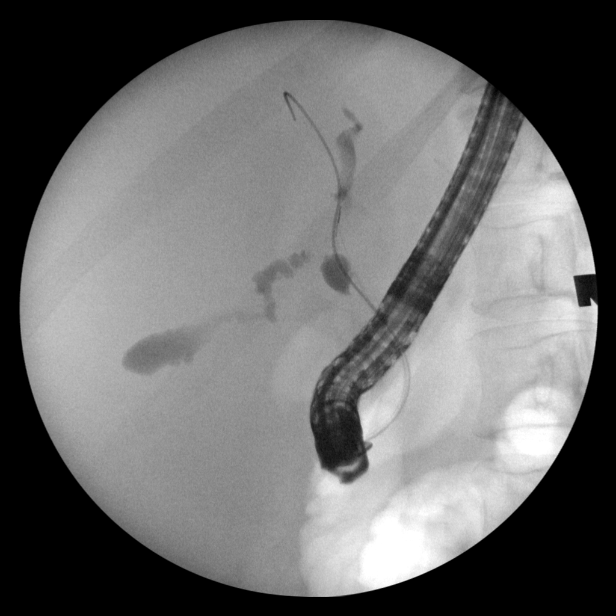
[im 3/6]
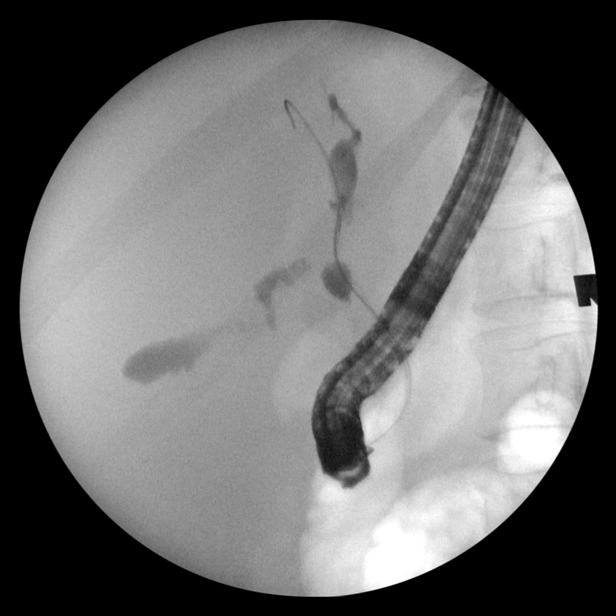
[im 3/6]
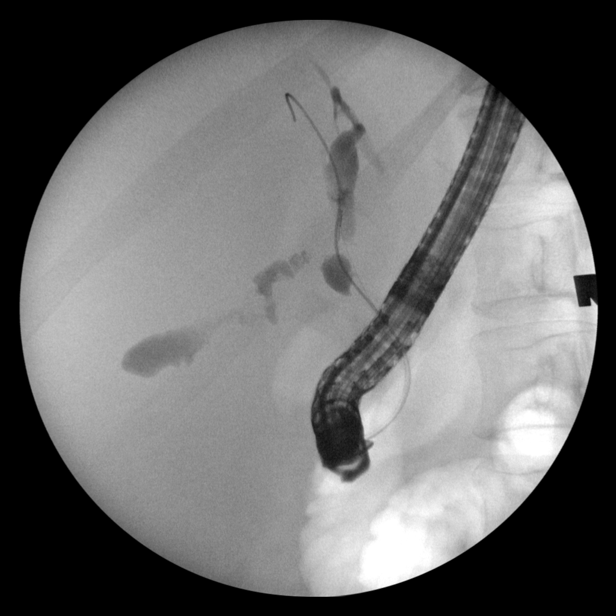
[im 4/6]
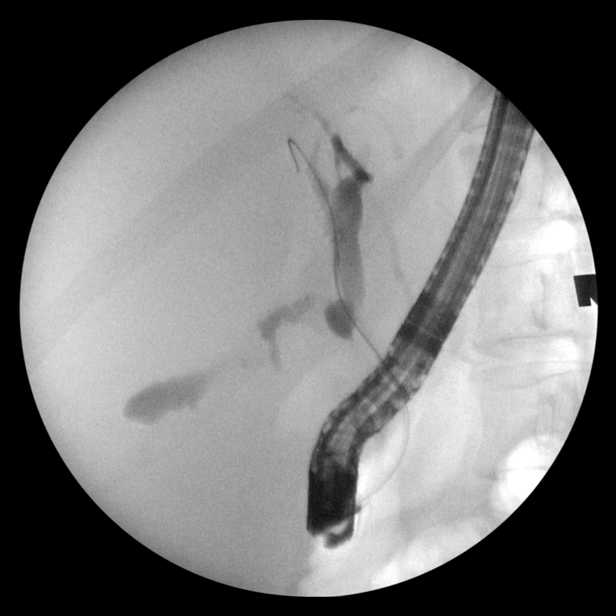
[im 4/6]
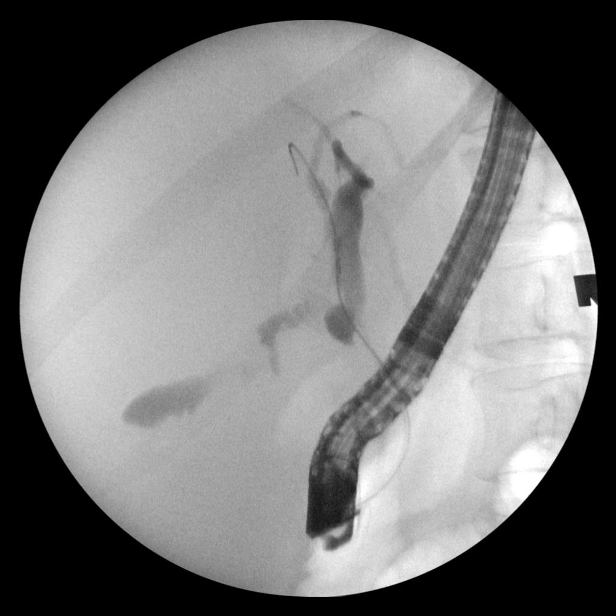
[im 4/6]
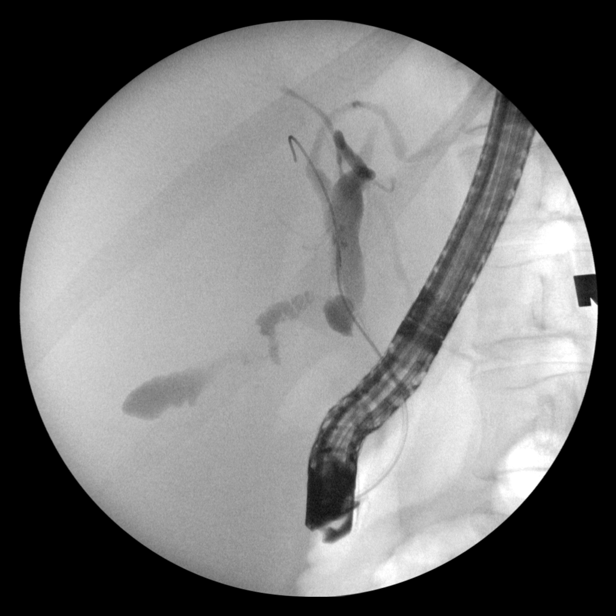
[im 5/6]
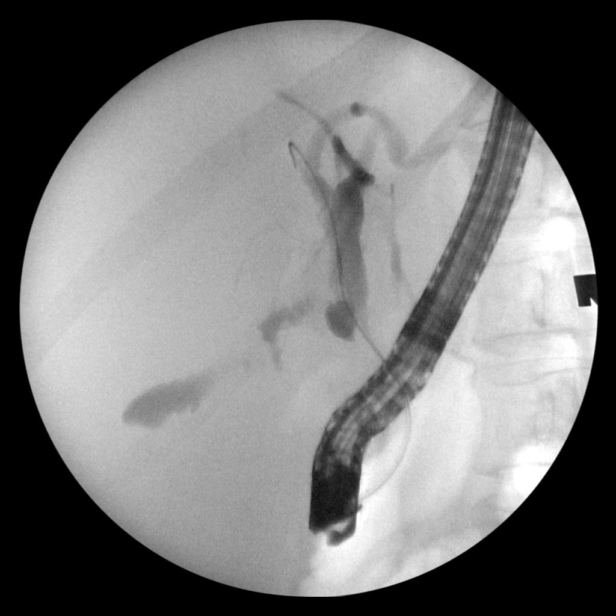
[im 5/6]
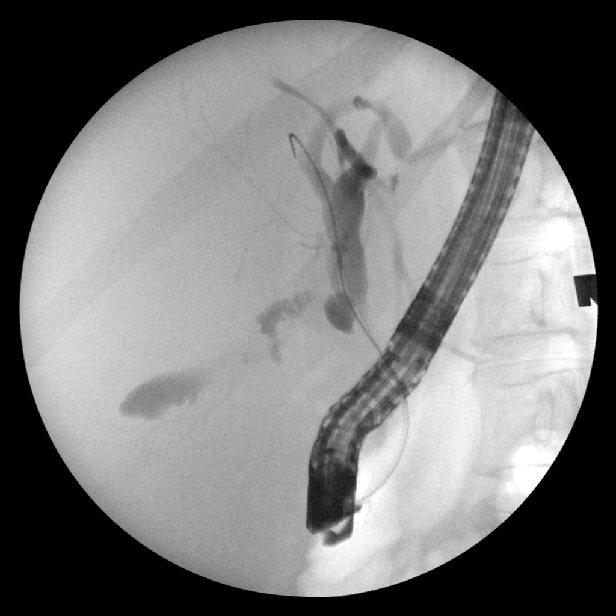
[im 5/6]
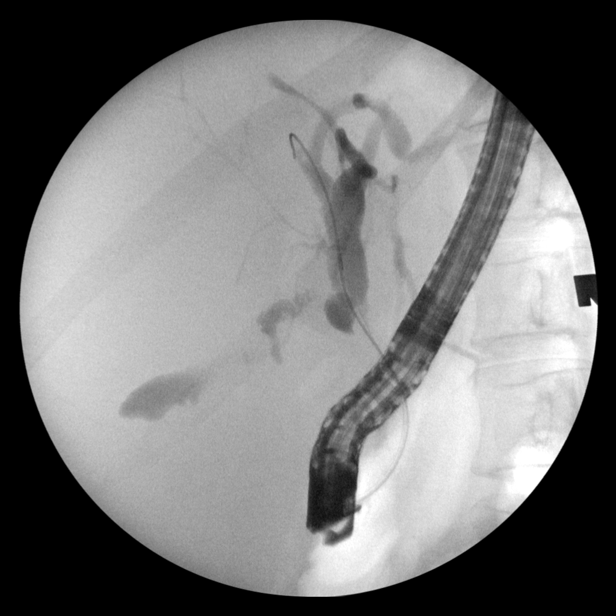
[im 6/6]
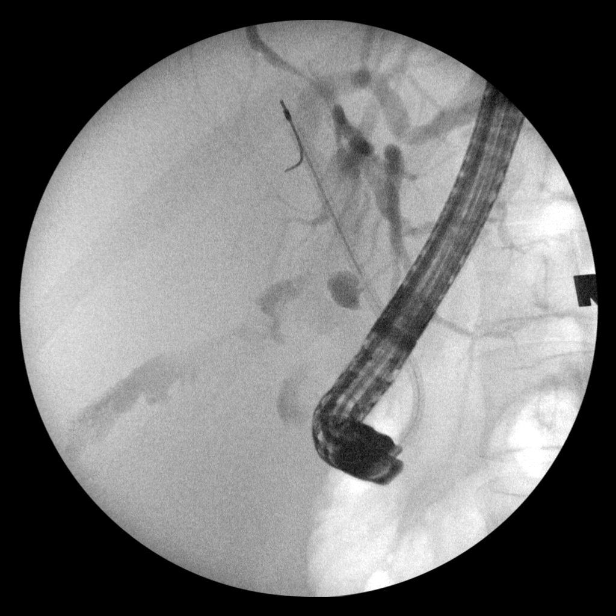

[15 of 17 positions shown; findings below may reference images not displayed]

FINDINGS: Images demonstrate cannulation of the common bile duct and contrast
filling the biliary tree. A biliary stent is placed and positioned
on the last image.
IMPRESSION: See above.

These images were submitted for radiologic interpretation only.
Please see the procedural report for the amount of contrast and the
fluoroscopy time utilized.

## 2018-09-22 SURGERY — ERCP, WITH INTERVENTION IF INDICATED
Anesthesia: General | Site: Esophagus

## 2018-09-22 MED ORDER — GLUCAGON HCL RDNA (DIAGNOSTIC) 1 MG IJ SOLR
INTRAMUSCULAR | Status: AC
  Filled 2018-09-22: qty 2

## 2018-09-22 MED ORDER — MIDAZOLAM HCL 2 MG/2ML IJ SOLN
INTRAMUSCULAR | Status: AC
  Filled 2018-09-22: qty 2

## 2018-09-22 MED ORDER — METOCLOPRAMIDE HCL 5 MG/ML IJ SOLN
INTRAMUSCULAR | Status: DC | PRN
  Administered 2018-09-22: 10 mg via INTRAVENOUS

## 2018-09-22 MED ORDER — LEVOFLOXACIN IN D5W 500 MG/100ML IV SOLN
INTRAVENOUS | Status: DC | PRN
  Administered 2018-09-22: 500 mg via INTRAVENOUS

## 2018-09-22 MED ORDER — PROMETHAZINE HCL 25 MG/ML IJ SOLN: 6.2500 mg | INTRAMUSCULAR | Status: DC | PRN

## 2018-09-22 MED ORDER — CEFAZOLIN SODIUM-DEXTROSE 2-4 GM/100ML-% IV SOLN: 2.0000 g | INTRAVENOUS | Status: DC

## 2018-09-22 MED ORDER — CHLORHEXIDINE GLUCONATE CLOTH 2 % EX PADS: 6.0000 | MEDICATED_PAD | Freq: Once | CUTANEOUS | Status: DC

## 2018-09-22 MED ORDER — SODIUM CHLORIDE 0.9 % IV SOLN
INTRAVENOUS | Status: AC
  Filled 2018-09-22: qty 50

## 2018-09-22 MED ORDER — SUGAMMADEX SODIUM 200 MG/2ML IV SOLN
INTRAVENOUS | Status: DC | PRN
  Administered 2018-09-22: 200 mg via INTRAVENOUS

## 2018-09-22 MED ORDER — SCOPOLAMINE 1 MG/3DAYS TD PT72
1.0000 | MEDICATED_PATCH | TRANSDERMAL | Status: DC
  Administered 2018-09-22: 1.5 mg via TRANSDERMAL

## 2018-09-22 MED ORDER — ROCURONIUM 10MG/ML (10ML) SYRINGE FOR MEDFUSION PUMP - OPTIME
INTRAVENOUS | Status: DC | PRN
  Administered 2018-09-22: 25 mg via INTRAVENOUS
  Administered 2018-09-22: 5 mg via INTRAVENOUS

## 2018-09-22 MED ORDER — FENTANYL CITRATE (PF) 100 MCG/2ML IJ SOLN
INTRAMUSCULAR | Status: AC
  Filled 2018-09-22: qty 2

## 2018-09-22 MED ORDER — MIDAZOLAM HCL 2 MG/2ML IJ SOLN: 0.5000 mg | Freq: Once | INTRAMUSCULAR | Status: DC | PRN

## 2018-09-22 MED ORDER — LACTATED RINGERS IV SOLN
INTRAVENOUS | Status: DC
  Administered 2018-09-22 (×2): via INTRAVENOUS

## 2018-09-22 MED ORDER — IOPAMIDOL (ISOVUE-300) INJECTION 61%
INTRAVENOUS | Status: DC | PRN
  Administered 2018-09-22: 13:00:00 50 mL via ORAL

## 2018-09-22 MED ORDER — ONDANSETRON HCL 4 MG/2ML IJ SOLN
INTRAMUSCULAR | Status: DC | PRN
  Administered 2018-09-22: 4 mg via INTRAVENOUS

## 2018-09-22 MED ORDER — SUCCINYLCHOLINE 20MG/ML (10ML) SYRINGE FOR MEDFUSION PUMP - OPTIME
INTRAMUSCULAR | Status: DC | PRN
  Administered 2018-09-22: 120 mg via INTRAVENOUS

## 2018-09-22 MED ORDER — PROPOFOL 10 MG/ML IV BOLUS
INTRAVENOUS | Status: DC | PRN
  Administered 2018-09-22: 100 mg via INTRAVENOUS
  Administered 2018-09-22: 30 mg via INTRAVENOUS

## 2018-09-22 MED ORDER — LEVOFLOXACIN IN D5W 500 MG/100ML IV SOLN
INTRAVENOUS | Status: AC
  Filled 2018-09-22: qty 100

## 2018-09-22 MED ORDER — PROPOFOL 10 MG/ML IV BOLUS
INTRAVENOUS | Status: AC
  Filled 2018-09-22: qty 20

## 2018-09-22 MED ORDER — INDOMETHACIN 50 MG RE SUPP: 100.0000 mg | Freq: Once | RECTAL | Status: DC

## 2018-09-22 MED ORDER — HYDROMORPHONE HCL 2 MG PO TABS: 2.0000 mg | ORAL_TABLET | Freq: Four times a day (QID) | ORAL | 0 refills | Status: DC | PRN

## 2018-09-22 MED ORDER — SCOPOLAMINE 1 MG/3DAYS TD PT72
MEDICATED_PATCH | TRANSDERMAL | Status: AC
  Filled 2018-09-22: qty 1

## 2018-09-22 MED ORDER — HYDROMORPHONE HCL 1 MG/ML IJ SOLN: 0.2500 mg | INTRAMUSCULAR | Status: DC | PRN

## 2018-09-22 MED ORDER — WATER FOR IRRIGATION, STERILE IR SOLN
Status: DC | PRN
  Administered 2018-09-22: 1000 mL via SURGICAL_CAVITY

## 2018-09-22 MED ORDER — METOPROLOL TARTRATE 5 MG/5ML IV SOLN
INTRAVENOUS | Status: DC | PRN
  Administered 2018-09-22: 2 mg via INTRAVENOUS

## 2018-09-22 MED ORDER — MIDAZOLAM HCL 5 MG/5ML IJ SOLN
INTRAMUSCULAR | Status: DC | PRN
  Administered 2018-09-22: 2 mg via INTRAVENOUS

## 2018-09-22 MED ORDER — HEPARIN SOD (PORK) LOCK FLUSH 100 UNIT/ML IV SOLN
250.0000 [IU] | INTRAVENOUS | Status: AC | PRN
  Administered 2018-09-22: 250 [IU]
  Filled 2018-09-22: qty 5

## 2018-09-22 MED ORDER — SCOPOLAMINE 1 MG/3DAYS TD PT72: 1.0000 | MEDICATED_PATCH | TRANSDERMAL | Status: DC

## 2018-09-22 MED ORDER — SODIUM CHLORIDE 0.9% FLUSH: 10.0000 mL | INTRAVENOUS | Status: DC | PRN

## 2018-09-22 MED ORDER — FENTANYL CITRATE (PF) 100 MCG/2ML IJ SOLN
INTRAMUSCULAR | Status: DC | PRN
  Administered 2018-09-22: 25 ug via INTRAVENOUS
  Administered 2018-09-22: 50 ug via INTRAVENOUS
  Administered 2018-09-22: 25 ug via INTRAVENOUS

## 2018-09-22 MED ORDER — ONDANSETRON HCL 4 MG/2ML IJ SOLN
INTRAMUSCULAR | Status: AC
  Filled 2018-09-22: qty 2

## 2018-09-22 NOTE — H&P (Signed)
Pamela Long is an 48 y.o. female.   Chief Complaint: Patient is here for ERCP with biliary stenting. HPI: Patient is 48 year old Caucasian female who has a history of breast carcinoma dating back to 2017 when she received neoadjuvant chemotherapy followed by mastectomy and antiestrogen therapy.  This therapy was discontinued because of side effects. He presented to emergency room in December 2019 with nausea vomiting and right upper quadrant abdominal pain when she was found to have large liver mass.  This was biopsied at [facility] in 06/26/2018 and turned out to be metastatic breast carcinoma. He was evaluated by Dr. [name] and begun on chemotherapy. About 3 weeks ago she noted her skin to be yellow.  She also had a period where she had clay colored stools.  She also has had itching.  Abdominal pelvic CT about 2 weeks ago revealed large liver mass but no evidence of biliary dilation.  Ultrasound 1 week ago revealed bile duct of 10 mm.  Patient had MRCP on 09/18/2018 which revealed very large liver lesion along with small lesions consistent with metastatic disease.  In addition the study showed dilated biliary system with cut off in the region of proximal CBD consistent with stricture most likely due to adenopathy which previously has been documented on imaging studies/PET scan. Patient reports having more right upper quadrant pain lately.  She is on Dilaudid.  She remains with nausea and pruritus she has not had any vomiting.  She states she has had intermittent rapid heartbeat.  She had echocardiography August 2019 and no significant abnormality was noted. Patient says she has had poor appetite and she has lost close to 35 pounds in last 4 months. Patient is non-smoker and she does not drink alcohol. She is disabled. History significant for breast carcinoma and first cousin who was around the same age she is as well as maternal aunt who was in her 23s and now she is 46 years old and  disease-free.  Past Medical History:  Diagnosis Date  . Anemia   . Anxiety   . Asthma   . Breast cancer ([place])    Left, 2017  . Depression   . GERD (gastroesophageal reflux disease)   . Migraine   . OAB (overactive bladder)   . Seasonal allergies     Past Surgical History:  Procedure Laterality Date  . ABDOMINAL HYSTERECTOMY     breast cancer  . ANKLE RECONSTRUCTION Right 1989  . BREAST SURGERY    . COLONOSCOPY WITH PROPOFOL N/A 08/06/2018   Procedure: COLONOSCOPY WITH PROPOFOL;  Surgeon: Danie Binder, MD;  Location: [facility];  Service: Endoscopy;  Laterality: N/A;  1:30pm  . FOREIGN BODY REMOVAL N/A 08/29/2017   from lip  . KNEE SURGERY Right 1989   bone spur  . MASTECTOMY  2017   bil mastectomies  . SINUSOTOMY      Family History  Problem Relation Age of Onset  . Arthritis Mother   . COPD Mother   . Depression Mother   . Diabetes Mother   . Kidney disease Father   . Heart disease Father 51  . Drug abuse Father   . Hypertension Father   . Diabetes Daughter   . Hashimoto's thyroiditis Daughter   . Irritable bowel syndrome Daughter   . Autism spectrum disorder Daughter   . Early death Maternal Grandmother        drowned  . Early death Maternal Grandfather   . Heart disease Maternal Grandfather   .  Heart disease Paternal Grandfather   . Breast cancer Maternal Aunt   . Breast cancer Cousin   . Lung cancer Maternal Aunt   . Lung cancer Maternal Uncle   . Colon cancer Neg Hx    Social History:  reports that she has never smoked. She has never used smokeless tobacco. She reports that she does not drink alcohol or use drugs.  Allergies:  Allergies  Allergen Reactions  . Corn-Containing Products Other (See Comments)    Headache and GI upset  . Salagen [Pilocarpine] Other (See Comments)    Can cause liver failure   . Tape Itching and Other (See Comments)    Depending on the adhesive-blistering occurs  . Amoxicillin Hives and Other (See Comments)     Rash only DID THE REACTION INVOLVE: Swelling of the face/tongue/throat, SOB, or low BP? Sudden or severe rash/hives, skin peeling, or the inside of the mouth or nose?  Did it require medical treatment?  When did it last happen? If all above answers are "NO", may proceed with cephalosporin use.   . Caffeine Diarrhea, Nausea Only, Palpitations and Other (See Comments)    Headache  . Tetanus Toxoids Swelling and Other (See Comments)    Local reaction    Medications Prior to Admission  Medication Sig Dispense Refill  . antiseptic oral rinse (BIOTENE) LIQD 15 mLs by Mouth Rinse route as needed for dry mouth.    . Cholecalciferol (VITAMIN D3 GUMMIES) 25 MCG (1000 UT) CHEW Chew 2,000-4,000 Units by mouth daily.    Marland Kitchen docusate sodium (COLACE) 100 MG capsule Take 1 capsule (100 mg total) by mouth 2 (two) times daily as needed for mild constipation. 30 capsule 2  . escitalopram (LEXAPRO) 20 MG tablet Take 1 tablet (20 mg total) by mouth daily. 90 tablet 3  . fulvestrant (FASLODEX) 250 MG/5ML injection Inject 250 mg into the muscle every 30 (thirty) days. One injection each buttock over 1-2 minutes. Warm prior to use.    . gabapentin (NEURONTIN) 300 MG capsule Take 600 mg by mouth at bedtime.     Marland Kitchen HYDROmorphone (DILAUDID) 2 MG tablet Take 1-2 tablets (2-4 mg total) by mouth every 6 (six) hours as needed for severe pain. 30 tablet 0  . lactulose (CHRONULAC) 10 GM/15ML solution Take 30 mLs (20 g total) by mouth 2 (two) times daily as needed for mild constipation. 480 mL 1  . lidocaine (LIDODERM) 5 % Place 1 patch onto the skin daily. Remove & Discard patch within 12 hours or as directed by MD (Patient taking differently: Place 1 patch onto the skin daily as needed. Remove & Discard patch within 12 hours or as directed by MD) 10 patch 1  . naproxen sodium (ALEVE) 220 MG tablet Take 220-440 mg by mouth 2 (two) times daily as needed (for pain or headache).     . ondansetron (ZOFRAN ODT) 8 MG  disintegrating tablet Take 1 tablet (8 mg total) by mouth every 8 (eight) hours as needed for nausea or vomiting. 20 tablet 1  . PACLitaxel (TAXOL IV) Inject into the vein once a week.     . pantoprazole (PROTONIX) 40 MG tablet TAKE 1 TABLET BY MOUTH DAILY BEFORE BREAKFAST (Patient taking differently: Take 40 mg by mouth daily before breakfast. ) 90 tablet 1  . Polyvinyl Alcohol-Povidone PF (REFRESH) 1.4-0.6 % SOLN Place 1 drop into both eyes 2 (two) times daily.     . prochlorperazine (COMPAZINE) 10 MG tablet Take 1 tablet (10 mg total)  by mouth every 6 (six) hours as needed for nausea or vomiting. 30 tablet 1  . rizatriptan (MAXALT-MLT) 10 MG disintegrating tablet Take 10 mg by mouth every 2 (two) hours as needed for migraine.     . traMADol (ULTRAM) 50 MG tablet Take 1 tablet (50 mg total) by mouth 2 (two) times daily. 30 tablet 0  . TROKENDI XR 50 MG CP24 Take 50 mg by mouth 2 (two) times daily.      Results for orders placed or performed in visit on 09/21/18 (from the past 48 hour(s))  Bilirubin, direct     Status: Abnormal   Collection Time: 09/21/18 12:00 PM  Result Value Ref Range   Bilirubin, Direct 8.6 (H) 0.0 - 0.2 mg/dL    Comment: Performed at [facility], [address] [place], [place] [address]   No results found.  ROS  Blood pressure 124/75, pulse (!) 120, temperature 98.8 F (37.1 C), temperature source Oral, resp. rate 14, height 5\' 5"  (1.651 m), weight 87.6 kg. Physical Exam  Constitutional: She appears well-developed and well-nourished.  HENT:  Mouth/Throat: Oropharynx is clear and moist.  Angular stomatitis  Eyes: Conjunctivae are normal. Scleral icterus is present.  Neck: No thyromegaly present.  Cardiovascular: Normal rate, regular rhythm and normal heart sounds.  No murmur heard. Respiratory: Effort normal and breath sounds normal.  GI:  Abdomen is symmetrical.  On palpation is soft.  She has enlarged liver particularly left lobe which is hard and mildly  tender.  No other masses noted.  Musculoskeletal:        General: Edema present.     Comments: Nonpitting edema noted around ankles.  Neurological: She is alert.  Skin: Skin is warm and dry.    Have reviewed the patient's imaging studies including ultrasound, CT as well as MRCP. KG reveals sinus tachycardia.  Assessment/Plan Patient is an unfortunate 48 year old Caucasian female who is dealing with metastatic breast carcinoma.  She now has developed obstructive jaundice.  MRCP reveals dilated biliary system to level of obstruction in the proximal CBD.  Obstruction appears to be due to adenopathy and not due to liver mass. Therefore will benefit from ERCP with biliary stenting. Reviewed procedure risks with the patient and she is agreeable.  Patient has sinus tachycardia.  She has history of this.  Echocardiogram in August 2019 was negative for valvular disease or cardiomyopathy.    Hildred Laser, MD 09/22/2018, 12:10 PM

## 2018-09-22 NOTE — Addendum Note (Signed)
Addended by: Derek Jack on: 09/22/2018 05:37 PM   Modules accepted: Orders

## 2018-09-22 NOTE — Progress Notes (Signed)
Brief ERCP note:  Food debris and liquid in the stomach. Normal ampulla of Vater. Highgrade srticture involving hepatic duct with upstream dilation. Patent cystic duct and normal filling of GB. 10 Fr. Plastic biliary stent placed for decompression, PD not filled with contrast or cannulated. Patient tolerated procedure well.

## 2018-09-22 NOTE — Transfer of Care (Signed)
Immediate Anesthesia Transfer of Care Note  Patient: Pamela Long  Procedure(s) Performed: ENDOSCOPIC RETROGRADE CHOLANGIOPANCREATOGRAPHY (ERCP) (N/A Esophagus) BILIARY STENT PLACEMENT (N/A Esophagus)  Patient Location: PACU  Anesthesia Type:General  Level of Consciousness: awake and alert   Airway & Oxygen Therapy: Patient Spontanous Breathing  Post-op Assessment: Report given to RN  Post vital signs: Reviewed and stable  Last Vitals:  Vitals Value Taken Time  BP 135/93 09/22/2018  1:48 PM  Temp    Pulse 118 09/22/2018  1:54 PM  Resp 19 09/22/2018  1:54 PM  SpO2 100 % 09/22/2018  1:54 PM  Vitals shown include unvalidated device data.  Last Pain:  Vitals:   09/22/18 1107  TempSrc: Oral  PainSc: 1       Patients Stated Pain Goal: 5 (31/67/42 5525)  Complications: No apparent anesthesia complications

## 2018-09-22 NOTE — Op Note (Signed)
[facility] Patient Name: Pamela Long Procedure Date: 09/22/2018 10:22 AM MRN: [id number] Date of Birth: [birth year] Attending MD: Hildred Laser , MD CSN: [id number] Age: 48 Admit Type: Outpatient Procedure:                ERCP Indications:              Malignant sticture of CBD. Providers:                Hildred Laser, MD, Jeanann Lewandowsky. Sharon Seller, RN, Starla Link RN, RN, Aram Candela Referring MD:             Derek Jack, MD Medicines:                General Anesthesia Complications:            No immediate complications. Estimated Blood Loss:     Estimated blood loss: none. Procedure:                Pre-Anesthesia Assessment:                           - Prior to the procedure, a History and Physical                            was performed, and patient medications and                            allergies were reviewed. The patient's tolerance of                            previous anesthesia was also reviewed. The risks                            and benefits of the procedure and the sedation                            options and risks were discussed with the patient.                            All questions were answered, and informed consent                            was obtained. Prior Anticoagulants: The patient has                            taken no previous anticoagulant or antiplatelet                            agents except for NSAID medication. ASA Grade                            Assessment: IV - A patient with severe systemic  disease that is a constant threat to life. After                            reviewing the risks and benefits, the patient was                            deemed in satisfactory condition to undergo the                            procedure.                           After obtaining informed consent, the scope was                            passed under direct vision. Throughout  the                            procedure, the patient's blood pressure, pulse, and                            oxygen saturations were monitored continuously. The                            [id number]-[id number] ([id number]) was introduced through the                            mouth, and used to inject contrast into and used to                            inject contrast into the bile duct. The ERCP was                            accomplished without difficulty. The patient                            tolerated the procedure well. The ERCP was somewhat                            difficult. The patient tolerated the procedure well. Scope In: 12:53:10 PM Scope Out: 1:22:04 PM Total Procedure Duration: 0 hours 28 minutes 54 seconds  Findings:      The scout film was normal. The esophagus was successfully intubated       under direct vision. The scope was advanced to a normal major papilla in       the descending duodenum without detailed examination of the pharynx,       larynx and associated structures, and upper GI tract. The upper GI tract       was grossly normal except there was fluid and foo debris. The bile duct       was deeply cannulated with the Hydratome sphincterotome. Contrast was       injected. I personally interpreted the bile duct images. There was brisk       flow of contrast through the ducts. Image quality was excellent.  Contrast extended to the entire biliary tree. The common hepatic duct       contained a single severe localized stenosis. The common hepatic duct       was moderately dilated and diffusely dilated, with extrinsic compression       causing an obstruction. One 10 Fr by 7 cm plastic stent with a single       external flap and a single internal flap was placed 6.5 cm into the       common bile duct. Fluid and bile flowed through the stent. The stent was       in good position. Impression:               - A single localized severe biliary stricture was                             found in the distal common hepatic duct. The                            stricture was malignant appearing.                           - Biliary system moderately dilated proximal to                            stricture with extrinsic compression(adenopathy)                            causing an obstruction.                           - One plastic stent was placed into the common bile                            duct for biliary decompression. Moderate Sedation:      Per Anesthesia Care Recommendation:           - Discharge patient to home (with escort).                           - Clear liquid diet today.                           - Continue present medications.                           - Follow-up with Dr. [name] later this week as                            planned. Procedure Code(s):        --- Professional ---                           [phone], Endoscopic retrograde                            cholangiopancreatography (ERCP); with placement of  endoscopic stent into biliary or pancreatic duct,                            including pre- and post-dilation and guide wire                            passage, when performed, including sphincterotomy,                            when performed, each stent Diagnosis Code(s):        --- Professional ---                           K83.1, Obstruction of bile duct CPT copyright 2019 American Medical Association. All rights reserved. The codes documented in this report are preliminary and upon coder review may  be revised to meet current compliance requirements. Hildred Laser, MD Hildred Laser, MD 09/22/2018 2:08:54 PM This report has been signed electronically. Number of Addenda: 0

## 2018-09-22 NOTE — Discharge Instructions (Signed)
Resume usual medications as before.  Endoscopic Retrograde Cholangiopancreatogram, Care After This sheet gives you information about how to care for yourself after your procedure. Your health care provider may also give you more specific instructions. If you have problems or questions, contact your health care provider. What can I expect after the procedure? After the procedure, it is common to have:  Soreness in your throat.  Nausea.  Bloating.  Dizziness.  Tiredness (fatigue). Follow these instructions at home:   Take over-the-counter and prescription medicines only as told by your health care provider.  Do not drive for 24 hours if you were given a medicine to help you relax (sedative) during your procedure. Have someone stay with you for 24 hours after the procedure.  Return to your normal activities as told by your health care provider. Ask your health care provider what activities are safe for you.  Return to eating what you normally do as soon as you feel well enough or as told by your health care provider.  Keep all follow-up visits as told by your health care provider. This is important. Contact a health care provider if:  You have pain in your abdomen that does not get better with medicine.  You develop signs of infection, such as: ? Chills. ? Feeling unwell. Get help right away if:  You have difficulty swallowing.  You have worsening pain in your throat, chest, or abdomen.  You vomit bright red blood or a substance that looks like coffee grounds.  You have bloody or very black stools.  You have a fever.  You have a sudden increase in swelling (bloating) in your abdomen. Summary  After the procedure, it is common to feel tired and to have some discomfort in your throat.  Contact your health care provider if you have signs of infection--such as chills or feeling unwell--or if you have pain that does not improve with medicine.  Get help right away if you  have trouble swallowing, worsening pain, bloody or black vomit, bloody or black stools, a fever, or increased swelling in your abdomen.  Keep all follow-up visits as told by your health care provider. This is important. This information is not intended to replace advice given to you by your health care provider. Make sure you discuss any questions you have with your health care provider. Document Released: 03/17/2013 Document Revised: 04/15/2016 Document Reviewed: 04/15/2016 Elsevier Interactive Patient Education  2019 [place] liquids today. Resume usual diet starting tomorrow morning. Follow-up with Dr. [name] later this week as planned.

## 2018-09-22 NOTE — Anesthesia Preprocedure Evaluation (Addendum)
Anesthesia Evaluation  Patient identified by MRN, date of birth, ID band Patient awake    Reviewed: Allergy & Precautions, NPO status , Patient's Chart, lab work & pertinent test results  History of Anesthesia Complications (+) PONV and history of anesthetic complications  Airway Mallampati: II  TM Distance: >3 FB Neck ROM: Full    Dental no notable dental hx. (+) Teeth Intact   Pulmonary asthma ,  Does have DOE States very weak  Denies SOB at rest  States last inhalers over 3 months ago    Pulmonary exam normal breath sounds clear to auscultation       Cardiovascular Exercise Tolerance: Poor negative cardio ROS Normal cardiovascular exam Rhythm:Regular Rate:Tachycardia  Weak  S/p chemo -last cardiology note states no further w/u indicated currently  Now tachy to mid 120s, is requesting scopalamine patch  Will see how HR does prior to use    Neuro/Psych  Headaches, Anxiety Depression negative psych ROS   GI/Hepatic Neg liver ROS, GERD  Medicated and Controlled,  Endo/Other  negative endocrine ROS  Renal/GU negative Renal ROS  negative genitourinary   Musculoskeletal negative musculoskeletal ROS (+)   Abdominal   Peds negative pediatric ROS (+)  Hematology negative hematology ROS (+) anemia ,   Anesthesia Other Findings   Reproductive/Obstetrics negative OB ROS                             Anesthesia Physical Anesthesia Plan  ASA: IV  Anesthesia Plan: General   Post-op Pain Management:    Induction: Intravenous  PONV Risk Score and Plan:   Airway Management Planned: Oral ETT  Additional Equipment:   Intra-op Plan:   Post-operative Plan: Extubation in OR  Informed Consent: I have reviewed the patients History and Physical, chart, labs and discussed the procedure including the risks, benefits and alternatives for the proposed anesthesia with the patient or authorized  representative who has indicated his/her understanding and acceptance.     Dental advisory given  Plan Discussed with: CRNA  Anesthesia Plan Comments: (Full PPE use planned  Dr. Laural Golden deemed this case appropriate in light of COVID concerns  Plan on using intubation box if possible  D/w pt poss post op ventilation if needed , confirmed full code status -WTP with GETA)        Anesthesia Quick Evaluation

## 2018-09-22 NOTE — Anesthesia Postprocedure Evaluation (Signed)
Anesthesia Post Note  Patient: Pamela Long  Procedure(s) Performed: ENDOSCOPIC RETROGRADE CHOLANGIOPANCREATOGRAPHY (ERCP) (N/A Esophagus) BILIARY STENT PLACEMENT (N/A Esophagus)  Patient location during evaluation: Short Stay Anesthesia Type: General Level of consciousness: awake and alert and oriented Pain management: pain level controlled Vital Signs Assessment: post-procedure vital signs reviewed and stable Respiratory status: spontaneous breathing Cardiovascular status: blood pressure returned to baseline and stable Postop Assessment: no apparent nausea or vomiting Anesthetic complications: no     Last Vitals:  Vitals:   09/22/18 1430 09/22/18 1440  BP: 139/89 (!) 133/95  Pulse: (!) 112   Resp: 20   Temp:    SpO2: 95%     Last Pain:  Vitals:   09/22/18 1440  TempSrc:   PainSc: 0-No pain                 Saya Mccoll

## 2018-09-22 NOTE — Anesthesia Procedure Notes (Signed)
Procedure Name: Intubation Performed by: Ollen Bowl, CRNA Pre-anesthesia Checklist: Patient identified, Emergency Drugs available, Suction available and Patient being monitored Patient Re-evaluated:Patient Re-evaluated prior to induction Oxygen Delivery Method: Circle system utilized Preoxygenation: Pre-oxygenation with 100% oxygen Induction Type: IV induction Laryngoscope Size: Mac and 3 Grade View: Grade I Tube type: Oral Tube size: 7.0 mm Number of attempts: 1 Airway Equipment and Method: Stylet and Oral airway Placement Confirmation: ETT inserted through vocal cords under direct vision,  positive ETCO2 and breath sounds checked- equal and bilateral Secured at: 22 cm Tube secured with: Tape Dental Injury: Teeth and Oropharynx as per pre-operative assessment  Comments: plexi glass box utilized during intubation.

## 2018-09-23 MED ORDER — PROMETHAZINE HCL 25 MG/ML IJ SOLN: 6.2500 mg | INTRAMUSCULAR | Status: DC | PRN

## 2018-09-24 ENCOUNTER — Encounter (HOSPITAL_COMMUNITY): Payer: Self-pay | Admitting: Internal Medicine

## 2018-09-24 ENCOUNTER — Other Ambulatory Visit (HOSPITAL_COMMUNITY): Payer: Self-pay | Admitting: *Deleted

## 2018-09-24 MED ORDER — MAGIC MOUTHWASH W/LIDOCAINE: 5.0000 mL | Freq: Four times a day (QID) | ORAL | 2 refills | Status: DC | PRN

## 2018-09-24 NOTE — Telephone Encounter (Signed)
Patient called clinic stating that she had a sore throat prior to her procedure this week and it it now much worse. She states that her throat is all one blister and is extremely painful.  She can't gargle anything but is able to swish and spit or swallow.  She denies any white coating on her tongue or mouth at this time.  I have called in magic mouth wash with lidocaine to her pharmacy.  She verbalizes understanding of the instructions.  She is instructed to eat approximately 20-30 minutes after using the mouth wash so that it will ease the pain in her throat and she can keep her nutrition up.  She verbalizes understanding of that as well.

## 2018-09-25 ENCOUNTER — Inpatient Hospital Stay (HOSPITAL_COMMUNITY): Payer: 59 | Attending: Hematology

## 2018-09-25 ENCOUNTER — Inpatient Hospital Stay (HOSPITAL_COMMUNITY): Payer: 59 | Admitting: Hematology

## 2018-09-25 ENCOUNTER — Inpatient Hospital Stay (HOSPITAL_COMMUNITY): Payer: 59

## 2018-09-25 ENCOUNTER — Encounter (HOSPITAL_COMMUNITY): Payer: Self-pay | Admitting: Hematology

## 2018-09-25 ENCOUNTER — Other Ambulatory Visit: Payer: Self-pay

## 2018-09-25 VITALS — BP 130/87 | HR 116 | Temp 98.4°F | Resp 18 | Wt 191.5 lb

## 2018-09-25 DIAGNOSIS — C50111 Malignant neoplasm of central portion of right female breast: Secondary | ICD-10-CM | POA: Diagnosis not present

## 2018-09-25 DIAGNOSIS — C50919 Malignant neoplasm of unspecified site of unspecified female breast: Secondary | ICD-10-CM

## 2018-09-25 DIAGNOSIS — C50112 Malignant neoplasm of central portion of left female breast: Secondary | ICD-10-CM

## 2018-09-25 DIAGNOSIS — Z170 Estrogen receptor positive status [ER+]: Secondary | ICD-10-CM

## 2018-09-25 LAB — COMPREHENSIVE METABOLIC PANEL
ALT: 129 U/L — ABNORMAL HIGH (ref 0–44)
AST: 579 U/L — ABNORMAL HIGH (ref 15–41)
Albumin: 2.4 g/dL — ABNORMAL LOW (ref 3.5–5.0)
Alkaline Phosphatase: 211 U/L — ABNORMAL HIGH (ref 38–126)
Anion gap: 10 (ref 5–15)
BUN: 15 mg/dL (ref 6–20)
CO2: 24 mmol/L (ref 22–32)
Calcium: 8.6 mg/dL — ABNORMAL LOW (ref 8.9–10.3)
Chloride: 97 mmol/L — ABNORMAL LOW (ref 98–111)
Creatinine, Ser: 0.7 mg/dL (ref 0.44–1.00)
GFR calc Af Amer: >60 mL/min (ref >60)
GFR calc non Af Amer: >60 mL/min (ref >60)
Glucose, Bld: 133 mg/dL — ABNORMAL HIGH (ref 70–99)
Potassium: 3.3 mmol/L — ABNORMAL LOW (ref 3.5–5.1)
Sodium: 131 mmol/L — ABNORMAL LOW (ref 135–145)
Total Bilirubin: 8.2 mg/dL — ABNORMAL HIGH (ref 0.3–1.2)
Total Protein: 6.8 g/dL (ref 6.5–8.1)

## 2018-09-25 LAB — CBC WITH DIFFERENTIAL/PLATELET
Abs Immature Granulocytes: 0.02 10*3/uL (ref 0.00–0.07)
Basophils Absolute: 0.1 10*3/uL (ref 0.0–0.1)
Basophils Relative: 3 %
Eosinophils Absolute: 0.1 10*3/uL (ref 0.0–0.5)
Eosinophils Relative: 2 %
HCT: 26.6 % — ABNORMAL LOW (ref 36.0–46.0)
Hemoglobin: 9.1 g/dL — ABNORMAL LOW (ref 12.0–15.0)
Immature Granulocytes: 1 %
Lymphocytes Relative: 38 %
Lymphs Abs: 0.9 10*3/uL (ref 0.7–4.0)
MCH: 31.3 pg (ref 26.0–34.0)
MCHC: 34.2 g/dL (ref 30.0–36.0)
MCV: 91.4 fL (ref 80.0–100.0)
Monocytes Absolute: 0.5 10*3/uL (ref 0.1–1.0)
Monocytes Relative: 24 %
Neutro Abs: 0.7 10*3/uL — ABNORMAL LOW (ref 1.7–7.7)
Neutrophils Relative %: 32 %
Platelets: 168 10*3/uL (ref 150–400)
RBC: 2.91 MIL/uL — ABNORMAL LOW (ref 3.87–5.11)
RDW: 18.6 % — ABNORMAL HIGH (ref 11.5–15.5)
WBC Morphology: INCREASED
WBC: 2.2 10*3/uL — ABNORMAL LOW (ref 4.0–10.5)
nRBC: 3.6 % — ABNORMAL HIGH (ref 0.0–0.2)

## 2018-09-25 LAB — BILIRUBIN, DIRECT: Bilirubin, Direct: 4.5 mg/dL — ABNORMAL HIGH (ref 0.0–0.2)

## 2018-09-25 MED ORDER — HEPARIN SOD (PORK) LOCK FLUSH 100 UNIT/ML IV SOLN
300.0000 [IU] | Freq: Once | INTRAVENOUS | Status: AC
  Administered 2018-09-25: 300 [IU] via INTRAVENOUS

## 2018-09-25 MED ORDER — SODIUM CHLORIDE 0.9 % IV SOLN
INTRAVENOUS | Status: DC
  Administered 2018-09-25: 10:00:00 via INTRAVENOUS

## 2018-09-25 MED ORDER — SODIUM CHLORIDE 0.9 % IV SOLN
Freq: Once | INTRAVENOUS | Status: AC
  Administered 2018-09-25: 10:00:00 via INTRAVENOUS
  Filled 2018-09-25: qty 1000

## 2018-09-25 MED ORDER — FLUCONAZOLE IN SODIUM CHLORIDE 200-0.9 MG/100ML-% IV SOLN
200.0000 mg | Freq: Once | INTRAVENOUS | Status: AC
  Administered 2018-09-25: 200 mg via INTRAVENOUS
  Filled 2018-09-25: qty 100

## 2018-09-25 NOTE — Progress Notes (Signed)
[place] [place], [place] [address]   CLINIC:  Medical Oncology/Hematology  PCP:  Doree Albee, MD [place] [address] [phone]   REASON FOR VISIT:  Follow-up for Metastatic breast cancer to the liver  CURRENT THERAPY:Paclitaxel weekly started on 09/18/2018   BRIEF ONCOLOGIC HISTORY:    Metastatic breast cancer ([place])   07/02/2018 Initial Diagnosis    Metastatic breast cancer ([place])    09/18/2018 -  Chemotherapy    The patient had PACLitaxel (TAXOL) 120 mg in sodium chloride 0.9 % 250 mL chemo infusion (</= 59m/m2), 60 mg/m2 = 120 mg (100 % of original dose 60 mg/m2), Intravenous,  Once, 1 of 4 cycles Dose modification: 60 mg/m2 (original dose 60 mg/m2, Cycle 1, Reason: Change in LFTs) Administration: 120 mg (09/18/2018)  for chemotherapy treatment.       CANCER STAGING: Cancer Staging No matching staging information was found for the patient.   INTERVAL HISTORY:  Ms. 89. female returns for routine follow-up and consideration for next cycle of chemotherapy. She is her today alone. She states that she feels better but her throat hurts so bad, she was given magic mouthwash yesterday and that seems to help some. She states that she is pretty tired since her last visit. Denies any nausea, vomiting, or diarrhea. Denies any new pains. Had not noticed any recent bleeding such as epistaxis, hematuria or hematochezia. Denies recent chest pain on exertion, shortness of breath on minimal exertion, pre-syncopal episodes, or palpitations. Denies any numbness or tingling in hands or feet. Denies any recent fevers, infections, or recent hospitalizations. Patient reports appetite at 25% and energy level at 25%.     REVIEW OF SYSTEMS:  Review of Systems  Constitutional: Positive for fatigue.  HENT:   Positive for mouth sores.   All other systems reviewed and are negative.    PAST MEDICAL/SURGICAL HISTORY:  Past  Medical History:  Diagnosis Date   Anemia    Anxiety    Asthma    [place]    [place], [place]   Depression    GERD (gastroesophageal reflux disease)    Migraine    OAB (overactive bladder)    Seasonal allergies    Past Surgical History:  Procedure Laterality Date   ABDOMINAL HYSTERECTOMY     breast cancer   ANKLE RECONSTRUCTION Right 1989   BILIARY STENT PLACEMENT N/A 09/22/2018   Procedure: BILIARY STENT PLACEMENT;  Surgeon: RRogene Houston MD;  [facility];  Service: Endoscopy;  Laterality: N/A;   BREAST SURGERY     COLONOSCOPY WITH PROPOFOL N/A 08/06/2018   Procedure: COLONOSCOPY WITH PROPOFOL;  Surgeon: FDanie Binder MD;  Location: [facility];  Service: Endoscopy;  Laterality: N/A;  1:30pm   ERCP N/A 09/22/2018   Procedure: ENDOSCOPIC RETROGRADE CHOLANGIOPANCREATOGRAPHY (ERCP);  Surgeon: RRogene Houston MD;  [facility];  Service: Endoscopy;  Laterality: N/A;   FOREIGN BODY REMOVAL N/A 08/29/2017   from lip   KNEE SURGERY Right 1989   bone spur   MASTECTOMY  2017   bil mastectomies   SINUSOTOMY       SOCIAL HISTORY:  Social History   Socioeconomic History   Marital status: Single    Spouse name: Not on file   Number of children: 1   Years of education: 16   Highest education level: Not on file  Occupational History   Occupation: disabled  Social  Needs   Financial resource strain: Somewhat hard   Food insecurity:    Worry: Sometimes true    Inability: Sometimes true   Transportation needs:    Medical: No    Non-medical: No  Tobacco Use   Smoking status: Never Smoker   Smokeless tobacco: Never Used  Substance and Sexual Activity   Alcohol use: No   Drug use: No   Sexual activity: Not Currently  Lifestyle   Physical activity:    Days per week: 0 days    Minutes per session: 0 min   Stress: Rather much  Relationships   Social connections:    Talks on phone: Once a week    Gets  together: Once a week    Attends religious service: 1 to 4 times per year    Active member of club or organization: Yes    Attends meetings of clubs or organizations: Never    Relationship status: Never married   Intimate partner violence:    Fear of current or ex partner: No    Emotionally abused: No    Physically abused: No    Forced sexual activity: No  Other Topics Concern   Not on file  Social History Narrative   Bachelors degree   Accounting   Lives with daughter [name] who has autism and diabetes   Likes to sew, quilt, crafts, crochet    FAMILY HISTORY:  Family History  Problem Relation Age of Onset   Arthritis Mother    COPD Mother    Depression Mother    Diabetes Mother    Kidney disease Father    Heart disease Father 48   Drug abuse Father    Hypertension Father    Diabetes Daughter    Hashimoto's thyroiditis Daughter    Irritable bowel syndrome Daughter    Autism spectrum disorder Daughter    Early death Maternal Grandmother        drowned   Early death Maternal Grandfather    Heart disease Maternal Grandfather    Heart disease Paternal Grandfather    Breast cancer Maternal Aunt    Breast cancer Cousin    Lung cancer Maternal Aunt    Lung cancer Maternal Uncle    Colon cancer Neg Hx     CURRENT MEDICATIONS:  Outpatient Encounter Medications as of 09/25/2018  Medication Sig Note   antiseptic oral rinse (BIOTENE) LIQD 15 mLs by Mouth Rinse route as needed for dry mouth.    Cholecalciferol (VITAMIN D3 GUMMIES) 25 MCG (1000 UT) CHEW Chew 2,000-4,000 Units by mouth daily.    docusate sodium (COLACE) 100 MG capsule Take 1 capsule (100 mg total) by mouth 2 (two) times daily as needed for mild constipation.    escitalopram (LEXAPRO) 20 MG tablet Take 1 tablet (20 mg total) by mouth daily.    fulvestrant (FASLODEX) 250 MG/5ML injection Inject 250 mg into the muscle every 30 (thirty) days. One injection each buttock over 1-2 minutes.  Warm prior to use. 09/09/2018: Received on 08/28/2018   gabapentin (NEURONTIN) 300 MG capsule Take 600 mg by mouth at bedtime.     HYDROmorphone (DILAUDID) 2 MG tablet Take 1-2 tablets (2-4 mg total) by mouth every 6 (six) hours as needed for severe pain.    lactulose (CHRONULAC) 10 GM/15ML solution Take 30 mLs (20 g total) by mouth 2 (two) times daily as needed for mild constipation. 09/21/2018: Pt tries to avoid - it makes her feel sick    lidocaine (LIDODERM) 5 %  Place 1 patch onto the skin daily. Remove & Discard patch within 12 hours or as directed by MD (Patient taking differently: Place 1 patch onto the skin daily as needed. Remove & Discard patch within 12 hours or as directed by MD)    magic mouthwash w/lidocaine SOLN Take 5 mLs by mouth 4 (four) times daily as needed for mouth pain.    naproxen sodium (ALEVE) 220 MG tablet Take 220-440 mg by mouth 2 (two) times daily as needed (for pain or headache).     ondansetron (ZOFRAN ODT) 8 MG disintegrating tablet Take 1 tablet (8 mg total) by mouth every 8 (eight) hours as needed for nausea or vomiting.    PACLitaxel (TAXOL IV) Inject into the vein once a week.     pantoprazole (PROTONIX) 40 MG tablet TAKE 1 TABLET BY MOUTH DAILY BEFORE BREAKFAST (Patient taking differently: Take 40 mg by mouth daily before breakfast. )    Polyvinyl Alcohol-Povidone PF (REFRESH) 1.4-0.6 % SOLN Place 1 drop into both eyes 2 (two) times daily.     prochlorperazine (COMPAZINE) 10 MG tablet Take 1 tablet (10 mg total) by mouth every 6 (six) hours as needed for nausea or vomiting.    rizatriptan (MAXALT-MLT) 10 MG disintegrating tablet Take 10 mg by mouth every 2 (two) hours as needed for migraine.     traMADol (ULTRAM) 50 MG tablet Take 1 tablet (50 mg total) by mouth 2 (two) times daily.    TROKENDI XR 50 MG CP24 Take 50 mg by mouth 2 (two) times daily.    No facility-administered encounter medications on file as of 09/25/2018.     ALLERGIES:  Allergies    Allergen Reactions   Corn-Containing Products Other (See Comments)    Headache and GI upset   Salagen [Pilocarpine] Other (See Comments)    Can cause liver failure    Tape Itching and Other (See Comments)    Depending on the adhesive-blistering occurs   Amoxicillin Hives and Other (See Comments)    Rash only DID THE REACTION INVOLVE: Swelling of the face/tongue/throat, SOB, or low BP? Sudden or severe rash/hives, skin peeling, or the inside of the mouth or nose?  Did it require medical treatment?  When did it last happen? If all above answers are "NO", may proceed with cephalosporin use.    Caffeine Diarrhea, Nausea Only, Palpitations and Other (See Comments)    Headache   Tetanus Toxoids Swelling and Other (See Comments)    Local reaction     PHYSICAL EXAM:  ECOG Performance status: 2 Blood pressure is 130/87.  Pulse rate is 116.  Respirate is 18.  Temperature 98.4.  O2 saturations are 94%. Physical Exam Vitals signs reviewed.  Constitutional:      Appearance: Normal appearance.  Cardiovascular:     Rate and Rhythm: Normal rate and regular rhythm.     Heart sounds: Normal heart sounds.  Pulmonary:     Effort: Pulmonary effort is normal.     Breath sounds: Normal breath sounds.  Abdominal:     General: There is distension.     Palpations: Abdomen is soft.     Tenderness: There is abdominal tenderness.  Skin:    General: Skin is warm.  Neurological:     General: No focal deficit present.     Mental Status: She is alert and oriented to person, place, and time.  Psychiatric:        Mood and Affect: Mood normal.  Behavior: Behavior normal.      LABORATORY DATA:  I have reviewed the labs as listed.  CBC    Component Value Date/Time   WBC 2.2 (L) 09/25/2018 0820   RBC 2.91 (L) 09/25/2018 0820   HGB 9.1 (L) 09/25/2018 0820   HCT 26.6 (L) 09/25/2018 0820   PLT 168 09/25/2018 0820   MCV 91.4 09/25/2018 0820   MCH 31.3 09/25/2018 0820   MCHC  34.2 09/25/2018 0820   RDW 18.6 (H) 09/25/2018 0820   LYMPHSABS 0.9 09/25/2018 0820   MONOABS 0.5 09/25/2018 0820   EOSABS 0.1 09/25/2018 0820   BASOSABS 0.1 09/25/2018 0820   CMP Latest Ref Rng & Units 09/25/2018 09/21/2018 09/18/2018  Glucose 70 - 99 mg/dL 133(H) 113(H) 127(H)  BUN 6 - 20 mg/dL 15 22(H) 20  Creatinine 0.44 - 1.00 mg/dL 0.70 0.52 0.77  Sodium 135 - 145 mmol/L 131(L) 134(L) 136  Potassium 3.5 - 5.1 mmol/L 3.3(L) 3.8 3.6  Chloride 98 - 111 mmol/L 97(L) 97(L) 99  CO2 22 - 32 mmol/L _0 Calcium 8.9 - 10.3 mg/dL 8.6(L) 9.3 9.8  Total Protein 6.5 - 8.1 g/dL 6.8 7.3 7.6  Total Bilirubin 0.3 - 1.2 mg/dL 8.2(H) 14.2(H) 10.6(H)  Alkaline Phos 38 - 126 U/L 211(H) 214(H) 201(H)  AST 15 - 41 U/L 579(H) 700(H) 404(H)  ALT 0 - 44 U/L 129(H) 150(H) 128(H)       DIAGNOSTIC IMAGING:  I have independently reviewed the scans and discussed with the patient.   I have reviewed Venita Lick LPN's note and agree with the documentation.  I personally performed a face-to-face visit, made revisions and my assessment and plan is as follows.    ASSESSMENT & PLAN:   Metastatic breast cancer ([place]) 1.  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 -Underwent neoadjuvant chemotherapy with dose dense AC followed by Taxol from 07/14/2015 through 11/29/2015. - Left lumpectomy plus SLNB, consistent with infiltrating lobular carcinoma, pleomorphic features, ER/PR positive, HER-2 negative, 0 out of 3 lymph nodes positive. - Zoladex 3.6 mg IM monthly started in August 2017 followed by prophylactic right breast mastectomy plus completion of left breast simple mastectomy, radiation not indicated. -Zoladex stopped due to joint pain, restarted in October 2017, exemestane started in January 2018 - Exemestane and Zoladex held secondary to joint pains, TAH and BSO in  April 2018, exemestane 25 mg daily -Moved to [place] from [place] in March 2018, and was seeing Dr. Zenovia Jordan at [facility]. -Genetic testing with multicolor gene panel was negative. -In August 2018 she was switched to tamoxifen 20 mg daily.  In November 2019 she was switched to anastrozole and she took only for few weeks. -Patient started having epigastric and right upper quadrant pains in mid December 2019. -CT CAP on 06/17/2018 shows very large hepatic mass measuring 22 cm, mildly enlarged left internal mammary node at 0.8 cm, borderline enlarged left lower neck level 4 node at 0.9 cm. - Biopsy of the liver mass on 06/26/2018 showed breast cancer, ER/PR positive and HER-2 negative. -Brain MRI in January 2020 was also negative for metastatic disease. -Faslodex started on 07/02/2018 Abemaciclib 150 mg twice daily started on 07/07/2018 and discontinued on 09/07/2018.   -PET CT scan on 07/13/2018 shows very large centrally necrotic mass involving the right and left hepatic lobes, one other hypermetabolic lesion in the left lobe.  Multiple hypermetabolic lymph nodes in the upper abdomen, left supraclavicular space.  No thoracic adenopathy, chest wall mass or pulmonary or osseous lesions. - CT PE protocol on 08/03/2018 did not demonstrate any pulmonary embolus.  No edema or consolidation.  Stable subcentimeter left supraclavicular lymph nodes.   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  No pathological fracture or epidural extension.  Borderline to mild degenerative spinal stenosis at T11 and T12. - She went to the ER on 09/07/2018 with worsening pain.  CT CAP on the same day was negative for pulmonary embolism.  Prominent bilateral hilar lymph nodes were seen.  Extensive malignancy in the liver.  There is new satellite nodules when compared to 06/17/2018 CT of the abdomen. - MRCP on 09/18/2018 shows bulky liver metastatic disease, with progression  since 07/13/2018 PET CT scan.  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 -She did receive first cycle of paclitaxel on 09/18/2018 at a dose of 60 mg/m.  She tolerated it reasonably well. -She had severe thrush.  We will give her Diflucan 200 mg IV today.  She will be given Diflucan tablet 100 mg for the next few days. -We reviewed her blood work.  ANC is low at 700.  Hence we would hold off for chemotherapy today.  I plan to reevaluate her next week.  We also plan to start her on Xeloda along with paclitaxel.  2.  Hyperbilirubinemia: -Her bilirubin increased to as high as 14.2.  Today it has come down to 8.2. - MRCP on 09/18/2018 shows new extrinsic malignant biliary stricture at the level of the proximal common bile duct with new scattered mild to moderate intrahepatic biliary ductal dilatation in both liver lobes.  - She was seen by Dr.Rehman and a plastic stent placed in the hepatic duct with good drainage of bile on 09/22/2018.  3.  Right upper quadrant pain: -She is taking Dilaudid 1 to 2 tablets every 6 hours as needed.  Overall the pain has improved since stent placement. -I have told her to cut back on Dilaudid to every 8 hours.  She will use lactulose for constipation.      Total time spent is 25 minutes with more than 50% of the time spent face-to-face discussing blood work results, ERCP results and coordination of care.    Orders placed this encounter:  Orders Placed This Encounter  Procedures   Bilirubin, direct      Derek Jack, MD [place] [phone]

## 2018-09-25 NOTE — Patient Instructions (Signed)
[facility]  Discharge Instructions:   _______________________________________________________________  Thank you for choosing [facility] at [facility] to provide your oncology and hematology care.  To afford each patient quality time with our providers, please arrive at least 15 minutes before your scheduled appointment.  You need to re-schedule your appointment if you arrive 10 or more minutes late.  We strive to give you quality time with our providers, and arriving late affects you and other patients whose appointments are after yours.  Also, if you no show three or more times for appointments you may be dismissed from the clinic.  Again, thank you for choosing [facility] at [facility]. Our hope is that these requests will allow you access to exceptional care and in a timely manner. _______________________________________________________________  If you have questions after your visit, please contact our office at [phone] between the hours of 8:30 a.m. and 5:00 p.m. Voicemails left after 4:30 p.m. will not be returned until the following business day. _______________________________________________________________  For prescription refill requests, have your pharmacy contact our office. _______________________________________________________________  Recommendations made by the consultant and any test results will be sent to your referring physician. _______________________________________________________________ 

## 2018-09-25 NOTE — Assessment & Plan Note (Signed)
1.  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 -Underwent neoadjuvant chemotherapy with dose dense AC followed by Taxol from 07/14/2015 through 11/29/2015. - Left lumpectomy plus SLNB, consistent with infiltrating lobular carcinoma, pleomorphic features, ER/PR positive, HER-2 negative, 0 out of 3 lymph nodes positive. - Zoladex 3.6 mg IM monthly started in August 2017 followed by prophylactic right breast mastectomy plus completion of left breast simple mastectomy, radiation not indicated. -Zoladex stopped due to joint pain, restarted in October 2017, exemestane started in January 2018 - Exemestane and Zoladex held secondary to joint pains, TAH and BSO in April 2018, exemestane 25 mg daily -Moved to [place] from [place] in March 2018, and was seeing Dr. Zenovia Jordan at [facility]. -Genetic testing with multicolor gene panel was negative. -In August 2018 she was switched to tamoxifen 20 mg daily.  In November 2019 she was switched to anastrozole and she took only for few weeks. -Patient started having epigastric and right upper quadrant pains in mid December 2019. -CT CAP on 06/17/2018 shows very large hepatic mass measuring 22 cm, mildly enlarged left internal mammary node at 0.8 cm, borderline enlarged left lower neck level 4 node at 0.9 cm. - Biopsy of the liver mass on 06/26/2018 showed breast cancer, ER/PR positive and HER-2 negative. -Brain MRI in January 2020 was also negative for metastatic disease. -Faslodex started on 07/02/2018 Abemaciclib 150 mg twice daily started on 07/07/2018 and discontinued on 09/07/2018.   -PET CT scan on 07/13/2018 shows very large centrally necrotic mass involving the right and left hepatic lobes, one other hypermetabolic lesion in the left lobe.  Multiple hypermetabolic lymph nodes in the upper abdomen, left supraclavicular  space.  No thoracic adenopathy, chest wall mass or pulmonary or osseous lesions. - CT PE protocol on 08/03/2018 did not demonstrate any pulmonary embolus.  No edema or consolidation.  Stable subcentimeter left supraclavicular lymph nodes.   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  No pathological fracture or epidural extension.  Borderline to mild degenerative spinal stenosis at T11 and T12. - She went to the ER on 09/07/2018 with worsening pain.  CT CAP on the same day was negative for pulmonary embolism.  Prominent bilateral hilar lymph nodes were seen.  Extensive malignancy in the liver.  There is new satellite nodules when compared to 06/17/2018 CT of the abdomen. - MRCP on 09/18/2018 shows bulky liver metastatic disease, with progression since 07/13/2018 PET CT scan.  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 -She did receive first cycle of paclitaxel on 09/18/2018 at a dose of 60 mg/m.  She tolerated it reasonably well. -She had severe thrush.  We will give her Diflucan 200 mg IV today.  She will be given Diflucan tablet 100 mg for the next few days. -We reviewed her blood work.  ANC is low at 700.  Hence we would hold off for chemotherapy today.  I plan to reevaluate her next week.  We also plan to start her on Xeloda along with paclitaxel.  2.  Hyperbilirubinemia: -Her bilirubin increased to as high as 14.2.  Today it has come down to 8.2. - MRCP on 09/18/2018 shows new extrinsic malignant biliary stricture at the level of the proximal common  bile duct with new scattered mild to moderate intrahepatic biliary ductal dilatation in both liver lobes.  - She was seen by Dr.Rehman and a plastic stent placed in the hepatic duct with good drainage of bile on 09/22/2018.  3.  Right upper quadrant pain: -She is taking Dilaudid 1  to 2 tablets every 6 hours as needed.  Overall the pain has improved since stent placement. -I have told her to cut back on Dilaudid to every 8 hours.  She will use lactulose for constipation.

## 2018-09-25 NOTE — Progress Notes (Signed)
No treatment today. Will given Diflucan and hydration fluids per MD.   Patient tolerated it well without problems. Vitals stable and discharged home from clinic via wheelchair. Follow up as scheduled.

## 2018-09-25 NOTE — Patient Instructions (Addendum)
[facility] at [facility] Discharge Instructions  You were seen today by Dr. Katragadda. He went over your recent lab results. He will see you back in 1 week for labs and follow up.   Thank you for choosing [facility] at [facility] to provide your oncology and hematology care.  To afford each patient quality time with our provider, please arrive at least 15 minutes before your scheduled appointment time.   If you have a lab appointment with the Cancer Center please come in thru the  Main Entrance and check in at the main information desk  You need to re-schedule your appointment should you arrive 10 or more minutes late.  We strive to give you quality time with our providers, and arriving late affects you and other patients whose appointments are after yours.  Also, if you no show three or more times for appointments you may be dismissed from the clinic at the providers discretion.     Again, thank you for choosing [facility].  Our hope is that these requests will decrease the amount of time that you wait before being seen by our physicians.       _____________________________________________________________  Should you have questions after your visit to [facility], please contact our office at [phone] between the hours of 8:00 a.m. and 4:30 p.m.  Voicemails left after 4:00 p.m. will not be returned until the following business day.  For prescription refill requests, have your pharmacy contact our office and allow 72 hours.    Cancer Center Support Programs:   > Cancer Support Group  2nd Tuesday of the month 1pm-2pm, Journey Room    

## 2018-09-28 ENCOUNTER — Inpatient Hospital Stay (HOSPITAL_COMMUNITY): Payer: 59 | Attending: Hematology

## 2018-09-28 ENCOUNTER — Other Ambulatory Visit: Payer: Self-pay

## 2018-09-28 ENCOUNTER — Encounter (HOSPITAL_COMMUNITY): Payer: Self-pay

## 2018-09-28 DIAGNOSIS — Z170 Estrogen receptor positive status [ER+]: Secondary | ICD-10-CM | POA: Diagnosis not present

## 2018-09-28 DIAGNOSIS — C50112 Malignant neoplasm of central portion of left female breast: Secondary | ICD-10-CM | POA: Diagnosis not present

## 2018-09-28 DIAGNOSIS — Z452 Encounter for adjustment and management of vascular access device: Secondary | ICD-10-CM | POA: Diagnosis not present

## 2018-09-28 DIAGNOSIS — C50919 Malignant neoplasm of unspecified site of unspecified female breast: Secondary | ICD-10-CM

## 2018-09-28 LAB — CBC WITH DIFFERENTIAL/PLATELET
Abs Immature Granulocytes: 1.13 10*3/uL — ABNORMAL HIGH (ref 0.00–0.07)
Basophils Absolute: 0.2 10*3/uL — ABNORMAL HIGH (ref 0.0–0.1)
Basophils Relative: 3 %
Eosinophils Absolute: 0 10*3/uL (ref 0.0–0.5)
Eosinophils Relative: 0 %
HCT: 29.1 % — ABNORMAL LOW (ref 36.0–46.0)
Hemoglobin: 9.7 g/dL — ABNORMAL LOW (ref 12.0–15.0)
Immature Granulocytes: 16 %
Lymphocytes Relative: 23 %
Lymphs Abs: 1.6 10*3/uL (ref 0.7–4.0)
MCH: 30.5 pg (ref 26.0–34.0)
MCHC: 33.3 g/dL (ref 30.0–36.0)
MCV: 91.5 fL (ref 80.0–100.0)
Monocytes Absolute: 2 10*3/uL — ABNORMAL HIGH (ref 0.1–1.0)
Monocytes Relative: 30 %
Neutro Abs: 2 10*3/uL (ref 1.7–7.7)
Neutrophils Relative %: 28 %
Platelets: 179 10*3/uL (ref 150–400)
RBC: 3.18 MIL/uL — ABNORMAL LOW (ref 3.87–5.11)
RDW: 19.2 % — ABNORMAL HIGH (ref 11.5–15.5)
WBC: 7 10*3/uL (ref 4.0–10.5)
nRBC: 14.2 % — ABNORMAL HIGH (ref 0.0–0.2)

## 2018-09-28 LAB — COMPREHENSIVE METABOLIC PANEL
ALT: 121 U/L — ABNORMAL HIGH (ref 0–44)
AST: 471 U/L — ABNORMAL HIGH (ref 15–41)
Albumin: 2.2 g/dL — ABNORMAL LOW (ref 3.5–5.0)
Alkaline Phosphatase: 215 U/L — ABNORMAL HIGH (ref 38–126)
Anion gap: 10 (ref 5–15)
BUN: 13 mg/dL (ref 6–20)
CO2: 23 mmol/L (ref 22–32)
Calcium: 8.5 mg/dL — ABNORMAL LOW (ref 8.9–10.3)
Chloride: 100 mmol/L (ref 98–111)
Creatinine, Ser: 0.79 mg/dL (ref 0.44–1.00)
GFR calc Af Amer: >60 mL/min (ref >60)
GFR calc non Af Amer: >60 mL/min (ref >60)
Glucose, Bld: 110 mg/dL — ABNORMAL HIGH (ref 70–99)
Potassium: 3.4 mmol/L — ABNORMAL LOW (ref 3.5–5.1)
Sodium: 133 mmol/L — ABNORMAL LOW (ref 135–145)
Total Bilirubin: 5.3 mg/dL — ABNORMAL HIGH (ref 0.3–1.2)
Total Protein: 6.3 g/dL — ABNORMAL LOW (ref 6.5–8.1)

## 2018-09-28 MED ORDER — SODIUM CHLORIDE 0.9% FLUSH
10.0000 mL | Freq: Once | INTRAVENOUS | Status: AC
  Administered 2018-09-28: 13:00:00 10 mL

## 2018-09-28 MED ORDER — HEPARIN SOD (PORK) LOCK FLUSH 100 UNIT/ML IV SOLN
500.0000 [IU] | Freq: Once | INTRAVENOUS | Status: AC
  Administered 2018-09-28: 13:00:00 500 [IU] via INTRAVENOUS

## 2018-09-28 NOTE — Progress Notes (Signed)
Pt presents today for PICC line flush and lab draw. VSS. Pt complains of hands red and inflamed bilateral. Upon assessment red patchy irritated areas on both hands and fingers. Pt states, " they often itch but not today." Requested RLockamy NP to assess patient at this time. Plan of care discussed with patient. Understanding verbalized. Pt has appointment with Dr. [name] on Thursday. 10/01/18  Vital signs stable. No complaints at this time. Discharged from clinic ambulatory. F/U with [facility] as scheduled.

## 2018-09-28 NOTE — Patient Instructions (Signed)
[facility]  Discharge Instructions:   _______________________________________________________________  Thank you for choosing [facility] at [facility] to provide your oncology and hematology care.  To afford each patient quality time with our providers, please arrive at least 15 minutes before your scheduled appointment.  You need to re-schedule your appointment if you arrive 10 or more minutes late.  We strive to give you quality time with our providers, and arriving late affects you and other patients whose appointments are after yours.  Also, if you no show three or more times for appointments you may be dismissed from the clinic.  Again, thank you for choosing [facility] at [facility]. Our hope is that these requests will allow you access to exceptional care and in a timely manner. _______________________________________________________________  If you have questions after your visit, please contact our office at [phone] between the hours of 8:30 a.m. and 5:00 p.m. Voicemails left after 4:30 p.m. will not be returned until the following business day. _______________________________________________________________  For prescription refill requests, have your pharmacy contact our office. _______________________________________________________________  Recommendations made by the consultant and any test results will be sent to your referring physician. _______________________________________________________________ 

## 2018-10-01 ENCOUNTER — Encounter (HOSPITAL_COMMUNITY): Payer: Self-pay | Admitting: Hematology

## 2018-10-01 ENCOUNTER — Other Ambulatory Visit: Payer: Self-pay

## 2018-10-01 ENCOUNTER — Inpatient Hospital Stay (HOSPITAL_BASED_OUTPATIENT_CLINIC_OR_DEPARTMENT_OTHER): Payer: 59 | Admitting: Hematology

## 2018-10-01 ENCOUNTER — Inpatient Hospital Stay (HOSPITAL_COMMUNITY): Payer: 59

## 2018-10-01 ENCOUNTER — Encounter (HOSPITAL_COMMUNITY): Payer: Self-pay

## 2018-10-01 VITALS — BP 123/48 | HR 89 | Temp 98.6°F | Resp 16 | Wt 185.0 lb

## 2018-10-01 VITALS — BP 135/87 | HR 86 | Temp 98.7°F | Resp 18

## 2018-10-01 DIAGNOSIS — Z170 Estrogen receptor positive status [ER+]: Secondary | ICD-10-CM | POA: Diagnosis not present

## 2018-10-01 DIAGNOSIS — C50919 Malignant neoplasm of unspecified site of unspecified female breast: Secondary | ICD-10-CM

## 2018-10-01 DIAGNOSIS — C50111 Malignant neoplasm of central portion of right female breast: Secondary | ICD-10-CM | POA: Diagnosis not present

## 2018-10-01 DIAGNOSIS — C50112 Malignant neoplasm of central portion of left female breast: Secondary | ICD-10-CM | POA: Diagnosis not present

## 2018-10-01 LAB — CBC WITH DIFFERENTIAL/PLATELET
Abs Immature Granulocytes: 1.32 10*3/uL — ABNORMAL HIGH (ref 0.00–0.07)
Basophils Absolute: 0.2 10*3/uL — ABNORMAL HIGH (ref 0.0–0.1)
Basophils Relative: 2 %
Eosinophils Absolute: 0 10*3/uL (ref 0.0–0.5)
Eosinophils Relative: 0 %
HCT: 32.1 % — ABNORMAL LOW (ref 36.0–46.0)
Hemoglobin: 10.6 g/dL — ABNORMAL LOW (ref 12.0–15.0)
Immature Granulocytes: 10 %
Lymphocytes Relative: 14 %
Lymphs Abs: 1.8 10*3/uL (ref 0.7–4.0)
MCH: 30.4 pg (ref 26.0–34.0)
MCHC: 33 g/dL (ref 30.0–36.0)
MCV: 92 fL (ref 80.0–100.0)
Monocytes Absolute: 1.9 10*3/uL — ABNORMAL HIGH (ref 0.1–1.0)
Monocytes Relative: 14 %
Neutro Abs: 8.1 10*3/uL — ABNORMAL HIGH (ref 1.7–7.7)
Neutrophils Relative %: 60 %
Platelets: 148 10*3/uL — ABNORMAL LOW (ref 150–400)
RBC: 3.49 MIL/uL — ABNORMAL LOW (ref 3.87–5.11)
RDW: 19.4 % — ABNORMAL HIGH (ref 11.5–15.5)
WBC: 13.4 10*3/uL — ABNORMAL HIGH (ref 4.0–10.5)
nRBC: 2.2 % — ABNORMAL HIGH (ref 0.0–0.2)

## 2018-10-01 LAB — COMPREHENSIVE METABOLIC PANEL
ALT: 146 U/L — ABNORMAL HIGH (ref 0–44)
AST: 556 U/L — ABNORMAL HIGH (ref 15–41)
Albumin: 2.4 g/dL — ABNORMAL LOW (ref 3.5–5.0)
Alkaline Phosphatase: 314 U/L — ABNORMAL HIGH (ref 38–126)
Anion gap: 9 (ref 5–15)
BUN: 12 mg/dL (ref 6–20)
CO2: 22 mmol/L (ref 22–32)
Calcium: 8.7 mg/dL — ABNORMAL LOW (ref 8.9–10.3)
Chloride: 104 mmol/L (ref 98–111)
Creatinine, Ser: 0.82 mg/dL (ref 0.44–1.00)
GFR calc Af Amer: >60 mL/min (ref >60)
GFR calc non Af Amer: >60 mL/min (ref >60)
Glucose, Bld: 124 mg/dL — ABNORMAL HIGH (ref 70–99)
Potassium: 3.6 mmol/L (ref 3.5–5.1)
Sodium: 135 mmol/L (ref 135–145)
Total Bilirubin: 4.8 mg/dL — ABNORMAL HIGH (ref 0.3–1.2)
Total Protein: 6.7 g/dL (ref 6.5–8.1)

## 2018-10-01 MED ORDER — CAPECITABINE 500 MG PO TABS: 1500.0000 mg | ORAL_TABLET | Freq: Two times a day (BID) | ORAL | 0 refills | Status: DC

## 2018-10-01 MED ORDER — DIPHENHYDRAMINE HCL 50 MG/ML IJ SOLN
25.0000 mg | Freq: Once | INTRAMUSCULAR | Status: AC
  Administered 2018-10-01: 12:00:00 25 mg via INTRAVENOUS
  Filled 2018-10-01: qty 1

## 2018-10-01 MED ORDER — SODIUM CHLORIDE 0.9% FLUSH
10.0000 mL | INTRAVENOUS | Status: DC | PRN
  Administered 2018-10-01: 10 mL
  Filled 2018-10-01: qty 10

## 2018-10-01 MED ORDER — PROMETHAZINE HCL 25 MG/ML IJ SOLN
12.5000 mg | Freq: Once | INTRAMUSCULAR | Status: AC
  Administered 2018-10-01: 12.5 mg via INTRAVENOUS

## 2018-10-01 MED ORDER — FAMOTIDINE IN NACL 20-0.9 MG/50ML-% IV SOLN
20.0000 mg | Freq: Once | INTRAVENOUS | Status: AC
  Administered 2018-10-01: 12:00:00 20 mg via INTRAVENOUS
  Filled 2018-10-01: qty 50

## 2018-10-01 MED ORDER — SODIUM CHLORIDE 0.9 % IV SOLN
Freq: Once | INTRAVENOUS | Status: AC
  Administered 2018-10-01: 12:00:00 via INTRAVENOUS

## 2018-10-01 MED ORDER — DIPHENHYDRAMINE HCL 50 MG/ML IJ SOLN
INTRAMUSCULAR | Status: AC
  Filled 2018-10-01: qty 1

## 2018-10-01 MED ORDER — HEPARIN SOD (PORK) LOCK FLUSH 100 UNIT/ML IV SOLN
250.0000 [IU] | Freq: Once | INTRAVENOUS | Status: AC | PRN
  Administered 2018-10-01: 250 [IU]

## 2018-10-01 MED ORDER — PALONOSETRON HCL INJECTION 0.25 MG/5ML
0.2500 mg | Freq: Once | INTRAVENOUS | Status: AC
  Administered 2018-10-01: 12:00:00 0.25 mg via INTRAVENOUS
  Filled 2018-10-01: qty 5

## 2018-10-01 MED ORDER — SODIUM CHLORIDE 0.9 % IV SOLN
10.0000 mg | Freq: Once | INTRAVENOUS | Status: AC
  Administered 2018-10-01: 10 mg via INTRAVENOUS
  Filled 2018-10-01: qty 10

## 2018-10-01 MED ORDER — PROMETHAZINE HCL 25 MG/ML IJ SOLN
INTRAMUSCULAR | Status: AC
  Filled 2018-10-01: qty 1

## 2018-10-01 MED ORDER — SODIUM CHLORIDE 0.9 % IV SOLN
60.0000 mg/m2 | Freq: Once | INTRAVENOUS | Status: AC
  Administered 2018-10-01: 120 mg via INTRAVENOUS
  Filled 2018-10-01: qty 20

## 2018-10-01 NOTE — Patient Instructions (Signed)
[facility] Discharge Instructions for Patients Receiving Chemotherapy  Today you received the following chemotherapy agents   To help prevent nausea and vomiting after your treatment, we encourage you to take your nausea medication   If you develop nausea and vomiting that is not controlled by your nausea medication, call the clinic.   BELOW ARE SYMPTOMS THAT SHOULD BE REPORTED IMMEDIATELY:  *FEVER GREATER THAN 100.5 F  *CHILLS WITH OR WITHOUT FEVER  NAUSEA AND VOMITING THAT IS NOT CONTROLLED WITH YOUR NAUSEA MEDICATION  *UNUSUAL SHORTNESS OF BREATH  *UNUSUAL BRUISING OR BLEEDING  TENDERNESS IN MOUTH AND THROAT WITH OR WITHOUT PRESENCE OF ULCERS  *URINARY PROBLEMS  *BOWEL PROBLEMS  UNUSUAL RASH Items with * indicate a potential emergency and should be followed up as soon as possible.  Feel free to call the clinic should you have any questions or concerns. The clinic phone number is [phone].  Please show the CHEMO ALERT CARD at check-in to the Emergency Department and triage nurse.   

## 2018-10-01 NOTE — Patient Instructions (Signed)
[facility] at [facility] Discharge Instructions  You were seen today by Dr. Katragadda. He went over your recent lab results. He will see you back in 1 week for labs and follow up.   Thank you for choosing  [facility] at [facility] to provide your oncology and hematology care.  To afford each patient quality time with our provider, please arrive at least 15 minutes before your scheduled appointment time.   If you have a lab appointment with the Cancer Center please come in thru the  Main Entrance and check in at the main information desk  You need to re-schedule your appointment should you arrive 10 or more minutes late.  We strive to give you quality time with our providers, and arriving late affects you and other patients whose appointments are after yours.  Also, if you no show three or more times for appointments you may be dismissed from the clinic at the providers discretion.     Again, thank you for choosing [facility].  Our hope is that these requests will decrease the amount of time that you wait before being seen by our physicians.       _____________________________________________________________  Should you have questions after your visit to [facility], please contact our office at [phone] between the hours of 8:00 a.m. and 4:30 p.m.  Voicemails left after 4:00 p.m. will not be returned until the following business day.  For prescription refill requests, have your pharmacy contact our office and allow 72 hours.    Cancer Center Support Programs:   > Cancer Support Group  2nd Tuesday of the month 1pm-2pm, Journey Room    

## 2018-10-01 NOTE — Progress Notes (Signed)
10/01/18  Patient with complaint of chemotherapy associated nausea, ok to add Aloxi to treatment plan and proceed with today's labs with elevated LFT's and tBili.  T.O.  Dr Titus Dubin Ronnald Ramp, PharmD

## 2018-10-01 NOTE — Assessment & Plan Note (Signed)
1.  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 -Underwent neoadjuvant chemotherapy with dose dense AC followed by Taxol from 07/14/2015 through 11/29/2015. - Left lumpectomy plus SLNB, consistent with infiltrating lobular carcinoma, pleomorphic features, ER/PR positive, HER-2 negative, 0 out of 3 lymph nodes positive. - Zoladex 3.6 mg IM monthly started in August 2017 followed by prophylactic right breast mastectomy plus completion of left breast simple mastectomy, radiation not indicated. -Zoladex stopped due to joint pain, restarted in October 2017, exemestane started in January 2018 - Exemestane and Zoladex held secondary to joint pains, TAH and BSO in April 2018, exemestane 25 mg daily -Moved to [place] from [place] in March 2018, and was seeing Dr. Zenovia Jordan at [facility]. -Genetic testing with multicolor gene panel was negative. -In August 2018 she was switched to tamoxifen 20 mg daily.  In November 2019 she was switched to anastrozole and she took only for few weeks. -Patient started having epigastric and right upper quadrant pains in mid December 2019. -CT CAP on 06/17/2018 shows very large hepatic mass measuring 22 cm, mildly enlarged left internal mammary node at 0.8 cm, borderline enlarged left lower neck level 4 node at 0.9 cm. - Biopsy of the liver mass on 06/26/2018 showed breast cancer, ER/PR positive and HER-2 negative. -Brain MRI in January 2020 was also negative for metastatic disease. -Faslodex started on 07/02/2018 Abemaciclib 150 mg twice daily started on 07/07/2018 and discontinued on 09/07/2018.   -PET CT scan on 07/13/2018 shows very large centrally necrotic mass involving the right and left hepatic lobes, one other hypermetabolic lesion in the left lobe.  Multiple hypermetabolic lymph nodes in the upper abdomen, left supraclavicular  space.  No thoracic adenopathy, chest wall mass or pulmonary or osseous lesions. - CT PE protocol on 08/03/2018 did not demonstrate any pulmonary embolus.  No edema or consolidation.  Stable subcentimeter left supraclavicular lymph nodes.   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  No pathological fracture or epidural extension.  Borderline to mild degenerative spinal stenosis at T11 and T12. - MRCP on 09/18/2018 shows bulky liver metastatic disease, with progression since 07/13/2018 PET CT scan.  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 -First cycle of paclitaxel on 09/18/2018 at the dose of 60 mg/m. -She had severe thrush which was treated with Diflucan.  She no longer has any thrush.  However she has some mucositis. -Today we have reviewed her blood work.  She may proceed with cycle 2 of paclitaxel at the same dose level. -I have talked to her about starting her on Xeloda 1500 mg twice daily, 2 weeks on 1 week off along with paclitaxel.  I will initiate the prescription process.  I plan to start it once bilirubin comes down further. - We talked about the side effects of Xeloda in detail.  I will see her back in 1 week for follow-up.  2.  Hyperbilirubinemia: -She underwent MRCP on 06/23/2018 which shows new extrinsic malignant biliary stricture at the level of the proximal CBD with new scattered mild to moderate intrahepatic biliary ductal dilatation in both liver lobes. -She was seen by Dr.Rehman and a plastic stent was placed in the hepatic duct with good drainage of bile on 09/22/2018. -  Today her total bilirubin improved to 4.8.  3.  Right upper quadrant pain: -She has not required any Dilaudid in the last 5 days.

## 2018-10-01 NOTE — Progress Notes (Signed)
Per MD, can proceed with treatment today based off previous labs, today will be day 8.  1205-lab results reviewed by MD. Liver enzymes noted by MD.   1410-patient still complaining of nausea. Will give phenergan as ordered by MD.   Patient stated she felt some better after phenergan. Treatment given per orders. Patient tolerated it well without problems. Vitals stable and discharged home from clinic via wheelchair.  Follow up as scheduled.

## 2018-10-01 NOTE — Progress Notes (Signed)
Pamela Long, [place] [address]   CLINIC:  Medical Oncology/Hematology  PCP:  Pamela Long [place] [address] [phone]   REASON FOR VISIT:  Follow-up for Metastatic breast cancer to the liver  CURRENT THERAPY:Paclitaxel weekly started on 09/18/2018      BRIEF ONCOLOGIC HISTORY:    Metastatic breast cancer ([place])   07/02/2018 Initial Diagnosis    Metastatic breast cancer ([place])    09/18/2018 -  Chemotherapy    The patient had palonosetron (ALOXI) injection 0.25 mg, 0.25 mg, Intravenous,  Once, 1 of 4 cycles Administration: 0.25 mg (10/01/2018) PACLitaxel (TAXOL) 120 mg in sodium chloride 0.9 % 250 mL chemo infusion (</= 33m/m2), 60 mg/m2 = 120 mg (100 % of original dose 60 mg/m2), Intravenous,  Once, 1 of 4 cycles Dose modification: 60 mg/m2 (original dose 60 mg/m2, Cycle 1, Reason: Change in LFTs) Administration: 120 mg (09/18/2018), 120 mg (10/01/2018)  for chemotherapy treatment.       CANCER STAGING: Cancer Staging No matching staging information was found for the patient.   INTERVAL HISTORY:  Ms. 89. female returns for routine follow-up and consideration for next cycle of chemotherapy. She is here today alone. She states that continues to have mouth sores. She states that she is still experiencing nausea. She states that the abdominal pain is much better. She states that she still has soreness in the back of her throat. She states that she is sleeping a lot and not eating very much. She states that she sleeps later in the morning and more active in the afternoon. She was educated on constipation prevention. Denies any nausea, vomiting, or diarrhea. Denies any new pains. Had not noticed any recent bleeding such as epistaxis, hematuria or hematochezia. Denies recent chest pain on exertion, shortness of breath on minimal exertion, pre-syncopal episodes, or palpitations. Denies any numbness or tingling  in hands or feet. Denies any recent fevers, infections, or recent hospitalizations. Patient reports appetite at 25% and energy level at 50%.   REVIEW OF SYSTEMS:  Review of Systems  HENT:   Positive for mouth sores.   Gastrointestinal: Positive for nausea.  All other systems reviewed and are negative.    PAST MEDICAL/SURGICAL HISTORY:  Past Medical History:  Diagnosis Date  . Anemia   . Anxiety   . Asthma   . Breast cancer ([facility]    Left, 2017  . Depression   . GERD (gastroesophageal reflux disease)   . Migraine   . OAB (overactive bladder)   . Seasonal allergies    Past Surgical History:  Procedure Laterality Date  . ABDOMINAL HYSTERECTOMY     breast cancer  . ANKLE RECONSTRUCTION Right 1989  . BILIARY STENT PLACEMENT N/A 09/22/2018   Procedure: BILIARY STENT PLACEMENT;  Surgeon: RRogene Houston Long;  [facility];  Service: Endoscopy;  Laterality: N/A;  . BREAST SURGERY    . COLONOSCOPY WITH PROPOFOL N/A 08/06/2018   Procedure: COLONOSCOPY WITH PROPOFOL;  Surgeon: FDanie Binder Long;  Location: [facility];  Service: Endoscopy;  Laterality: N/A;  1:30pm  . ERCP N/A 09/22/2018   Procedure: ENDOSCOPIC RETROGRADE CHOLANGIOPANCREATOGRAPHY (ERCP);  Surgeon: RRogene Houston Long;  Location: [facility];  Service: Endoscopy;  Laterality: N/A;  . FOREIGN BODY REMOVAL N/A 08/29/2017   from lip  . KNEE SURGERY Right 1989   bone spur  . MASTECTOMY  2017   bil mastectomies  .  SINUSOTOMY       SOCIAL HISTORY:  Social History   Socioeconomic History  . Marital status: Single    Spouse name: Not on file  . Number of children: 1  . Years of education: 12  . Highest education level: Not on file  Occupational History  . Occupation: disabled  Social Needs  . Financial resource strain: Somewhat hard  . Food insecurity:    Worry: Sometimes true    Inability: Sometimes true  . Transportation needs:    Medical: No    Non-medical: No  Tobacco Use  . Smoking  status: Never Smoker  . Smokeless tobacco: Never Used  Substance and Sexual Activity  . Alcohol use: No  . Drug use: No  . Sexual activity: Not Currently  Lifestyle  . Physical activity:    Days per week: 0 days    Minutes per session: 0 min  . Stress: Rather much  Relationships  . Social connections:    Talks on phone: Once a week    Gets together: Once a week    Attends religious service: 1 to 4 times per year    Active member of club or organization: Yes    Attends meetings of clubs or organizations: Never    Relationship status: Never married  . Intimate partner violence:    Fear of current or ex partner: No    Emotionally abused: No    Physically abused: No    Forced sexual activity: No  Other Topics Concern  . Not on file  Social History Narrative   Bachelors degree   Accounting   Lives with daughter Pamela Long who has autism and diabetes   Likes to sew, quilt, crafts, crochet    FAMILY HISTORY:  Family History  Problem Relation Age of Onset  . Arthritis Mother   . COPD Mother   . Depression Mother   . Diabetes Mother   . Kidney disease Father   . Heart disease Father 29  . Drug abuse Father   . Hypertension Father   . Diabetes Daughter   . Hashimoto's thyroiditis Daughter   . Irritable bowel syndrome Daughter   . Autism spectrum disorder Daughter   . Early death Maternal Grandmother        drowned  . Early death Maternal Grandfather   . Heart disease Maternal Grandfather   . Heart disease Paternal Grandfather   . Breast cancer Maternal Aunt   . Breast cancer Cousin   . Lung cancer Maternal Aunt   . Lung cancer Maternal Uncle   . Colon cancer Neg Hx     CURRENT MEDICATIONS:  Outpatient Encounter Medications as of 10/01/2018  Medication Sig Note  . antiseptic oral rinse (BIOTENE) LIQD 15 mLs by Mouth Rinse route as needed for dry mouth.   . benzonatate (TESSALON) 100 MG capsule    . Cholecalciferol (VITAMIN D3 GUMMIES) 25 MCG (1000 UT) CHEW Chew  2,000-4,000 Units by mouth daily.   Marland Kitchen docusate sodium (COLACE) 100 MG capsule Take 1 capsule (100 mg total) by mouth 2 (two) times daily as needed for mild constipation.   Marland Kitchen escitalopram (LEXAPRO) 20 MG tablet Take 1 tablet (20 mg total) by mouth daily.   . fulvestrant (FASLODEX) 250 MG/5ML injection Inject 250 mg into the muscle every 30 (thirty) days. One injection each buttock over 1-2 minutes. Warm prior to use. 09/09/2018: Received on 08/28/2018  . gabapentin (NEURONTIN) 300 MG capsule Take 600 mg by mouth at bedtime.    Marland Kitchen  HYDROmorphone (DILAUDID) 2 MG tablet Take 1-2 tablets (2-4 mg total) by mouth every 6 (six) hours as needed for severe pain.   Marland Kitchen lactulose (CHRONULAC) 10 GM/15ML solution Take 30 mLs (20 g total) by mouth 2 (two) times daily as needed for mild constipation. 09/21/2018: Pt tries to avoid - it makes her feel sick   . lidocaine (LIDODERM) 5 % Place 1 patch onto the skin daily. Remove & Discard patch within 12 hours or as directed by Long (Patient taking differently: Place 1 patch onto the skin daily as needed. Remove & Discard patch within 12 hours or as directed by Long)   . magic mouthwash w/lidocaine SOLN Take 5 mLs by mouth 4 (four) times daily as needed for mouth pain.   . naproxen sodium (ALEVE) 220 MG tablet Take 220-440 mg by mouth 2 (two) times daily as needed (for pain or headache).    . ondansetron (ZOFRAN ODT) 8 MG disintegrating tablet Take 1 tablet (8 mg total) by mouth every 8 (eight) hours as needed for nausea or vomiting.   Marland Kitchen PACLitaxel (TAXOL IV) Inject into the vein once a week.    . pantoprazole (PROTONIX) 40 MG tablet TAKE 1 TABLET BY MOUTH DAILY BEFORE BREAKFAST (Patient taking differently: Take 40 mg by mouth daily before breakfast. )   . Polyvinyl Alcohol-Povidone PF (REFRESH) 1.4-0.6 % SOLN Place 1 drop into both eyes 2 (two) times daily.    . prochlorperazine (COMPAZINE) 10 MG tablet Take 1 tablet (10 mg total) by mouth every 6 (six) hours as needed for nausea or  vomiting.   . rizatriptan (MAXALT-MLT) 10 MG disintegrating tablet Take 10 mg by mouth every 2 (two) hours as needed for migraine.    . traMADol (ULTRAM) 50 MG tablet Take 1 tablet (50 mg total) by mouth 2 (two) times daily.   Marland Kitchen TROKENDI XR 50 MG CP24 Take 50 mg by mouth 2 (two) times daily.    No facility-administered encounter medications on file as of 10/01/2018.     ALLERGIES:  Allergies  Allergen Reactions  . Corn-Containing Products Other (See Comments)    Headache and GI upset  . Salagen [Pilocarpine] Other (See Comments)    Can cause liver failure   . Tape Itching and Other (See Comments)    Depending on the adhesive-blistering occurs  . Amoxicillin Hives and Other (See Comments)    Rash only DID THE REACTION INVOLVE: Swelling of the face/tongue/throat, SOB, or low BP? Sudden or severe rash/hives, skin peeling, or the inside of the mouth or nose?  Did it require medical treatment?  When did it last happen? If all above answers are "NO", may proceed with cephalosporin use.   . Caffeine Diarrhea, Nausea Only, Palpitations and Other (See Comments)    Headache  . Tetanus Toxoids Swelling and Other (See Comments)    Local reaction     PHYSICAL EXAM:  ECOG Performance status: 2  Vitals:   10/01/18 1112  BP: (!) 123/48  Pulse: 89  Resp: 16  Temp: 98.6 F (37 C)  SpO2: 98%   Filed Weights   10/01/18 1112  Weight: 185 lb (83.9 kg)    Physical Exam Vitals signs reviewed.  Constitutional:      Appearance: Normal appearance.  Cardiovascular:     Rate and Rhythm: Normal rate and regular rhythm.     Heart sounds: Normal heart sounds.  Pulmonary:     Effort: Pulmonary effort is normal.     Breath sounds: Normal  breath sounds.  Abdominal:     General: There is no distension.     Palpations: Abdomen is soft. There is no mass.  Musculoskeletal:        General: No swelling.  Skin:    General: Skin is warm.  Neurological:     General: No focal deficit  present.     Mental Status: She is alert and oriented to person, place, and time.  Psychiatric:        Mood and Affect: Mood normal.        Behavior: Behavior normal.      LABORATORY DATA:  I have reviewed the labs as listed.  CBC    Component Value Date/Time   WBC 13.4 (H) 10/01/2018 1107   RBC 3.49 (L) 10/01/2018 1107   HGB 10.6 (L) 10/01/2018 1107   HCT 32.1 (L) 10/01/2018 1107   PLT 148 (L) 10/01/2018 1107   MCV 92.0 10/01/2018 1107   MCH 30.4 10/01/2018 1107   MCHC 33.0 10/01/2018 1107   RDW 19.4 (H) 10/01/2018 1107   LYMPHSABS 1.8 10/01/2018 1107   MONOABS 1.9 (H) 10/01/2018 1107   EOSABS 0.0 10/01/2018 1107   BASOSABS 0.2 (H) 10/01/2018 1107   CMP Latest Ref Rng & Units 10/01/2018 09/28/2018 09/25/2018  Glucose 70 - 99 mg/dL 124(H) 110(H) 133(H)  BUN 6 - 20 mg/dL '12 13 15  ' Creatinine 0.44 - 1.00 mg/dL 0.82 0.79 0.70  Sodium 135 - 145 mmol/L 135 133(L) 131(L)  Potassium 3.5 - 5.1 mmol/L 3.6 3.4(L) 3.3(L)  Chloride 98 - 111 mmol/L 104 100 97(L)  CO2 22 - 32 mmol/L '22 23 24  ' Calcium 8.9 - 10.3 mg/dL 8.7(L) 8.5(L) 8.6(L)  Total Protein 6.5 - 8.1 g/dL 6.7 6.3(L) 6.8  Total Bilirubin 0.3 - 1.2 mg/dL 4.8(H) 5.3(H) 8.2(H)  Alkaline Phos 38 - 126 U/L 314(H) 215(H) 211(H)  AST 15 - 41 U/L 556(H) 471(H) 579(H)  ALT 0 - 44 U/L 146(H) 121(H) 129(H)       DIAGNOSTIC IMAGING:  I have independently reviewed the scans and discussed with the patient.   I have reviewed Venita Lick LPN's note and agree with the documentation.  I personally performed a face-to-face visit, made revisions and my assessment and plan is as follows.    ASSESSMENT & PLAN:   Malignant neoplasm of central portion of left breast in female, estrogen receptor positive ([place]) 1.  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 -Underwent neoadjuvant chemotherapy with  dose dense AC followed by Taxol from 07/14/2015 through 11/29/2015. - Left lumpectomy plus SLNB, consistent with infiltrating lobular carcinoma, pleomorphic features, ER/PR positive, HER-2 negative, 0 out of 3 lymph nodes positive. - Zoladex 3.6 mg IM monthly started in August 2017 followed by prophylactic right breast mastectomy plus completion of left breast simple mastectomy, radiation not indicated. -Zoladex stopped due to joint pain, restarted in October 2017, exemestane started in January 2018 - Exemestane and Zoladex held secondary to joint pains, TAH and BSO in April 2018, exemestane 25 mg daily -Moved to [place] from [place] in March 2018, and was seeing Dr. Zenovia Jordan at [facility]. -Genetic testing with multicolor gene panel was negative. -In August 2018 she was switched to tamoxifen 20 mg daily.  In November 2019 she was switched to anastrozole and she took only for few weeks. -Patient started having epigastric and right upper quadrant pains  in mid December 2019. -CT CAP on 06/17/2018 shows very large hepatic mass measuring 22 cm, mildly enlarged left internal mammary node at 0.8 cm, borderline enlarged left lower neck level 4 node at 0.9 cm. - Biopsy of the liver mass on 06/26/2018 showed breast cancer, ER/PR positive and HER-2 negative. -Brain MRI in January 2020 was also negative for metastatic disease. -Faslodex started on 07/02/2018 Abemaciclib 150 mg twice daily started on 07/07/2018 and discontinued on 09/07/2018.   -PET CT scan on 07/13/2018 shows very large centrally necrotic mass involving the right and left hepatic lobes, one other hypermetabolic lesion in the left lobe.  Multiple hypermetabolic lymph nodes in the upper abdomen, left supraclavicular space.  No thoracic adenopathy, chest wall mass or pulmonary or osseous lesions. - CT PE protocol on 08/03/2018 did not demonstrate any pulmonary embolus.  No edema or consolidation.  Stable subcentimeter left supraclavicular lymph  nodes.   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  No pathological fracture or epidural extension.  Borderline to mild degenerative spinal stenosis at T11 and T12. - MRCP on 09/18/2018 shows bulky liver metastatic disease, with progression since 07/13/2018 PET CT scan.  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 -First cycle of paclitaxel on 09/18/2018 at the dose of 60 mg/m. -She had severe thrush which was treated with Diflucan.  She no longer has any thrush.  However she has some mucositis. -Today we have reviewed her blood work.  She may proceed with cycle 2 of paclitaxel at the same dose level. -I have talked to her about starting her on Xeloda 1500 mg twice daily, 2 weeks on 1 week off along with paclitaxel.  I will initiate the prescription process.  I plan to start it once bilirubin comes down further. - We talked about the side effects of Xeloda in detail.  I will see her back in 1 week for follow-up.  2.  Hyperbilirubinemia: -She underwent MRCP on 06/23/2018 which shows new extrinsic malignant biliary stricture at the level of the proximal CBD with new scattered mild to moderate intrahepatic biliary ductal dilatation in both liver lobes. -She was seen by Dr.Rehman and a plastic stent was placed in the hepatic duct with good drainage of bile on 09/22/2018. -Today her total bilirubin improved to 4.8.  3.  Right upper quadrant pain: -She has not required any Dilaudid in the last 5 days.       Total time spent is 40 minutes with more than 50% of the time spent face-to-face discussing addition of Xeloda, side effects and coordination of care.    Orders placed this encounter:  No orders of the defined types were placed in this encounter.     Derek Jack, [place] [phone]

## 2018-10-02 ENCOUNTER — Telehealth (HOSPITAL_COMMUNITY): Payer: Self-pay | Admitting: Pharmacist

## 2018-10-02 ENCOUNTER — Other Ambulatory Visit (HOSPITAL_COMMUNITY): Payer: Self-pay | Admitting: Hematology

## 2018-10-02 DIAGNOSIS — C50919 Malignant neoplasm of unspecified site of unspecified female breast: Secondary | ICD-10-CM

## 2018-10-02 MED ORDER — CAPECITABINE 500 MG PO TABS: 1500.0000 mg | ORAL_TABLET | Freq: Two times a day (BID) | ORAL | 0 refills | Status: DC

## 2018-10-02 MED ORDER — PROMETHAZINE HCL 25 MG PO TABS: 25.0000 mg | ORAL_TABLET | Freq: Four times a day (QID) | ORAL | 3 refills | Status: DC | PRN

## 2018-10-02 NOTE — Telephone Encounter (Signed)
Pt called into office stated the Zofran was not working for her nausea. Called pt to let her know Dr. [name] prescribed  Phenergan 25 mg every 6 hours as needed for nausea and vomiting. Medication sent to pharmacy on file. Pt verbalized understanding.

## 2018-10-02 NOTE — Telephone Encounter (Signed)
Oral Oncology Pharmacist Encounter  Received new prescription for Xeloda (capecitabine) for the treatment of metastatic breast cancer in conjunction with paclitaxel, planned duration until disease progression or unacceptable drug toxicity. Per the provider, the plan is to start her capecitabine once her bilirubin decreases.  CMP from 10/01/2018 assessed, no relevant lab abnormalities. Again per the provider, the plan is to start her capecitabine once her bilirubin decreases. Prescription dose and frequency assessed.   Current medication list in Epic reviewed, one relevant DDIs with capecitabine identified: - Pantoprazole: Proton Pump Inhibitors (PPI) may diminish the therapeutic effect of capecitabine. Recommend evaluating the need for a PPI/acid suppression. If acid suppression is needed, recommend switching to a H2 antagonist (eg, famotidine) to avoid this DDI.   Prescription has been e-scribed to the [facility] for benefits analysis and approval.  Oral Oncology Clinic will continue to follow for insurance authorization, copayment issues, initial counseling and start date.  Darl Pikes, PharmD, BCPS, [facility] Hematology/Oncology Clinical Pharmacist [facility] [phone]  10/02/2018 2:52 PM

## 2018-10-05 ENCOUNTER — Encounter (HOSPITAL_COMMUNITY): Payer: Self-pay

## 2018-10-05 ENCOUNTER — Inpatient Hospital Stay (HOSPITAL_COMMUNITY): Payer: 59

## 2018-10-05 ENCOUNTER — Telehealth (HOSPITAL_COMMUNITY): Payer: Self-pay | Admitting: Pharmacy Technician

## 2018-10-05 ENCOUNTER — Other Ambulatory Visit: Payer: Self-pay

## 2018-10-05 DIAGNOSIS — C50919 Malignant neoplasm of unspecified site of unspecified female breast: Secondary | ICD-10-CM

## 2018-10-05 DIAGNOSIS — C50111 Malignant neoplasm of central portion of right female breast: Secondary | ICD-10-CM | POA: Diagnosis not present

## 2018-10-05 LAB — CBC WITH DIFFERENTIAL/PLATELET
Abs Immature Granulocytes: 0.02 10*3/uL (ref 0.00–0.07)
Basophils Absolute: 0.1 10*3/uL (ref 0.0–0.1)
Basophils Relative: 1 %
Eosinophils Absolute: 0 10*3/uL (ref 0.0–0.5)
Eosinophils Relative: 0 %
HCT: 32.6 % — ABNORMAL LOW (ref 36.0–46.0)
Hemoglobin: 10.7 g/dL — ABNORMAL LOW (ref 12.0–15.0)
Immature Granulocytes: 0 %
Lymphocytes Relative: 17 %
Lymphs Abs: 0.8 10*3/uL (ref 0.7–4.0)
MCH: 30.2 pg (ref 26.0–34.0)
MCHC: 32.8 g/dL (ref 30.0–36.0)
MCV: 92.1 fL (ref 80.0–100.0)
Monocytes Absolute: 0.1 10*3/uL (ref 0.1–1.0)
Monocytes Relative: 2 %
Neutro Abs: 3.6 10*3/uL (ref 1.7–7.7)
Neutrophils Relative %: 80 %
Platelets: 130 10*3/uL — ABNORMAL LOW (ref 150–400)
RBC: 3.54 MIL/uL — ABNORMAL LOW (ref 3.87–5.11)
RDW: 17.6 % — ABNORMAL HIGH (ref 11.5–15.5)
WBC: 4.6 10*3/uL (ref 4.0–10.5)
nRBC: 0 % (ref 0.0–0.2)

## 2018-10-05 LAB — COMPREHENSIVE METABOLIC PANEL
ALT: 127 U/L — ABNORMAL HIGH (ref 0–44)
AST: 456 U/L — ABNORMAL HIGH (ref 15–41)
Albumin: 2.6 g/dL — ABNORMAL LOW (ref 3.5–5.0)
Alkaline Phosphatase: 255 U/L — ABNORMAL HIGH (ref 38–126)
Anion gap: 8 (ref 5–15)
BUN: 14 mg/dL (ref 6–20)
CO2: 24 mmol/L (ref 22–32)
Calcium: 8.6 mg/dL — ABNORMAL LOW (ref 8.9–10.3)
Chloride: 105 mmol/L (ref 98–111)
Creatinine, Ser: 0.74 mg/dL (ref 0.44–1.00)
GFR calc Af Amer: >60 mL/min (ref >60)
GFR calc non Af Amer: >60 mL/min (ref >60)
Glucose, Bld: 133 mg/dL — ABNORMAL HIGH (ref 70–99)
Potassium: 3.4 mmol/L — ABNORMAL LOW (ref 3.5–5.1)
Sodium: 137 mmol/L (ref 135–145)
Total Bilirubin: 4.8 mg/dL — ABNORMAL HIGH (ref 0.3–1.2)
Total Protein: 6.6 g/dL (ref 6.5–8.1)

## 2018-10-05 MED ORDER — SODIUM CHLORIDE 0.9% FLUSH
3.0000 mL | INTRAVENOUS | Status: DC | PRN
  Administered 2018-10-05: 3 mL via INTRAVENOUS
  Filled 2018-10-05: qty 10

## 2018-10-05 MED ORDER — HEPARIN SOD (PORK) LOCK FLUSH 100 UNIT/ML IV SOLN
250.0000 [IU] | Freq: Once | INTRAVENOUS | Status: AC
  Administered 2018-10-05: 12:00:00 250 [IU] via INTRAVENOUS

## 2018-10-05 MED FILL — CAPECITABINE 500 MG TABS: 500 | 21 days supply | Qty: 84 | Fill #0

## 2018-10-05 NOTE — Progress Notes (Signed)
Pamela Long tolerated PICC line lab draw with flush well without complaints or incident. PICC line dressing and cap changed per protocol after blood drawn for labs ordered then flushed with 3 ml NS and 2.5 ml Heparin easily per protocol.PICC line insertion site without redness or drainage noted VSS Pt discharged via wheelchair in satisfactory condition

## 2018-10-05 NOTE — Telephone Encounter (Signed)
Spoke with patient and set up delivery of Xeloda from [facility].  Patient should receive medication 4/28, at the latest 4/29.  I informed patient to not start the medication until Dr [name] instructed her to. She stated understanding.  [name] Phone [phone] Fax [phone] 10/05/2018 11:59 AM

## 2018-10-05 NOTE — Patient Instructions (Signed)
[place] at [facility] Discharge Instructions  Labs drawn from PICC line today. Follow-up as scheduled. Call clinic for a any questions or concerns   Thank you for choosing [place] at [facility] to provide your oncology and hematology care.  To afford each patient quality time with our provider, please arrive at least 15 minutes before your scheduled appointment time.   If you have a lab appointment with the [facility] please come in thru the  Main Entrance and check in at the main information desk  You need to re-schedule your appointment should you arrive 10 or more minutes late.  We strive to give you quality time with our providers, and arriving late affects you and other patients whose appointments are after yours.  Also, if you no show three or more times for appointments you may be dismissed from the clinic at the providers discretion.     Again, thank you for choosing [facility].  Our hope is that these requests will decrease the amount of time that you wait before being seen by our physicians.       _____________________________________________________________  Should you have questions after your visit to [facility], please contact our office at [phone] between the hours of 8:00 a.m. and 4:30 p.m.  Voicemails left after 4:00 p.m. will not be returned until the following business day.  For prescription refill requests, have your pharmacy contact our office and allow 72 hours.    Cancer Center Support Programs:   > Cancer Support Group  2nd Tuesday of the month 1pm-2pm, Journey Room

## 2018-10-05 NOTE — Telephone Encounter (Signed)
Oral Chemotherapy Pharmacist Encounter   Attempted to call Ms. Ranta for Xeloda education. No answer, LVM for patient to call back.   Darl Pikes, PharmD, BCPS, [facility] Hematology/Oncology Clinical Pharmacist [facility] [phone]  10/05/2018 12:02 PM

## 2018-10-05 NOTE — Telephone Encounter (Signed)
Oral Oncology Patient Advocate Encounter  After completing a benefits investigation, prior authorization for Xeloda (Capecitabine) is not required at this time through [facility].  Patient's copay is $4.00.    [place] Patient [place] Phone [phone] Fax [phone] 10/05/2018 11:03 AM

## 2018-10-05 NOTE — Telephone Encounter (Signed)
Oral Chemotherapy Pharmacist Encounter  Xeloda will be delivered to Ms. Osmanovic on 10/06/2018 from [place]. She knows not to start the Xeloda until she is given the go ahead by Dr. [name].   Patient Education I spoke with patient for overview of new oral chemotherapy medication: Xeloda (capecitabine) for the treatment of metastatic breast cancer in conjunction with paclitaxel, planned duration until disease progression or unacceptable drug toxicity. Per the provider, the plan is to start her capecitabine once her bilirubin decreases.  Pt is doing well. Counseled patient on administration, dosing, side effects, monitoring, drug-food interactions, safe handling, storage, and disposal. Patient will take 3 tablets (1,500 mg total) by mouth 2 (two) times daily after a meal. Take for 14 days, then hold for 7 days. Repeat every 21 days.  Side effects include but not limited to: fatigue, diarrhea, hand-foot syndrome, N/V, decreased wbc/hgb/plt.    Reviewed with patient importance of keeping a medication schedule and plan for any missed doses.  Ms. Balla voiced understanding and appreciation. All questions answered. Medication handout placed in the mail.  Provided patient with Oral [facility] phone number. Patient knows to call the office with questions or concerns. Oral Chemotherapy Navigation Clinic will continue to follow.  Darl Pikes, PharmD, BCPS, [facility] Hematology/Oncology Clinical Pharmacist [facility] [phone]  10/05/2018 2:18 PM

## 2018-10-09 ENCOUNTER — Inpatient Hospital Stay (HOSPITAL_COMMUNITY): Payer: 59

## 2018-10-09 ENCOUNTER — Inpatient Hospital Stay (HOSPITAL_COMMUNITY): Payer: 59 | Attending: Hematology

## 2018-10-09 ENCOUNTER — Other Ambulatory Visit: Payer: Self-pay

## 2018-10-09 ENCOUNTER — Encounter (HOSPITAL_COMMUNITY): Payer: Self-pay | Admitting: Hematology

## 2018-10-09 ENCOUNTER — Inpatient Hospital Stay (HOSPITAL_BASED_OUTPATIENT_CLINIC_OR_DEPARTMENT_OTHER): Payer: 59 | Admitting: Hematology

## 2018-10-09 ENCOUNTER — Encounter (HOSPITAL_COMMUNITY): Payer: Self-pay

## 2018-10-09 DIAGNOSIS — K5900 Constipation, unspecified: Secondary | ICD-10-CM | POA: Insufficient documentation

## 2018-10-09 DIAGNOSIS — D649 Anemia, unspecified: Secondary | ICD-10-CM | POA: Diagnosis not present

## 2018-10-09 DIAGNOSIS — Z170 Estrogen receptor positive status [ER+]: Secondary | ICD-10-CM

## 2018-10-09 DIAGNOSIS — C50112 Malignant neoplasm of central portion of left female breast: Secondary | ICD-10-CM | POA: Insufficient documentation

## 2018-10-09 DIAGNOSIS — Z5111 Encounter for antineoplastic chemotherapy: Secondary | ICD-10-CM | POA: Insufficient documentation

## 2018-10-09 DIAGNOSIS — R200 Anesthesia of skin: Secondary | ICD-10-CM | POA: Diagnosis not present

## 2018-10-09 DIAGNOSIS — N3281 Overactive bladder: Secondary | ICD-10-CM | POA: Insufficient documentation

## 2018-10-09 DIAGNOSIS — Z90722 Acquired absence of ovaries, bilateral: Secondary | ICD-10-CM | POA: Diagnosis not present

## 2018-10-09 DIAGNOSIS — R944 Abnormal results of kidney function studies: Secondary | ICD-10-CM | POA: Diagnosis not present

## 2018-10-09 DIAGNOSIS — Z803 Family history of malignant neoplasm of breast: Secondary | ICD-10-CM | POA: Diagnosis not present

## 2018-10-09 DIAGNOSIS — C50919 Malignant neoplasm of unspecified site of unspecified female breast: Secondary | ICD-10-CM

## 2018-10-09 DIAGNOSIS — Z9013 Acquired absence of bilateral breasts and nipples: Secondary | ICD-10-CM | POA: Insufficient documentation

## 2018-10-09 DIAGNOSIS — Z801 Family history of malignant neoplasm of trachea, bronchus and lung: Secondary | ICD-10-CM | POA: Insufficient documentation

## 2018-10-09 DIAGNOSIS — J45909 Unspecified asthma, uncomplicated: Secondary | ICD-10-CM | POA: Diagnosis not present

## 2018-10-09 DIAGNOSIS — Z800 Family history of malignant neoplasm of digestive organs: Secondary | ICD-10-CM | POA: Diagnosis not present

## 2018-10-09 DIAGNOSIS — Z90710 Acquired absence of both cervix and uterus: Secondary | ICD-10-CM | POA: Diagnosis not present

## 2018-10-09 DIAGNOSIS — F329 Major depressive disorder, single episode, unspecified: Secondary | ICD-10-CM | POA: Diagnosis not present

## 2018-10-09 DIAGNOSIS — C787 Secondary malignant neoplasm of liver and intrahepatic bile duct: Secondary | ICD-10-CM

## 2018-10-09 DIAGNOSIS — K219 Gastro-esophageal reflux disease without esophagitis: Secondary | ICD-10-CM | POA: Diagnosis not present

## 2018-10-09 DIAGNOSIS — Z9012 Acquired absence of left breast and nipple: Secondary | ICD-10-CM | POA: Insufficient documentation

## 2018-10-09 DIAGNOSIS — R202 Paresthesia of skin: Secondary | ICD-10-CM | POA: Diagnosis not present

## 2018-10-09 DIAGNOSIS — F419 Anxiety disorder, unspecified: Secondary | ICD-10-CM | POA: Insufficient documentation

## 2018-10-09 DIAGNOSIS — Z79899 Other long term (current) drug therapy: Secondary | ICD-10-CM | POA: Insufficient documentation

## 2018-10-09 DIAGNOSIS — R5383 Other fatigue: Secondary | ICD-10-CM | POA: Diagnosis not present

## 2018-10-09 LAB — CBC WITH DIFFERENTIAL/PLATELET
Abs Immature Granulocytes: 0.01 10*3/uL (ref 0.00–0.07)
Basophils Absolute: 0.2 10*3/uL — ABNORMAL HIGH (ref 0.0–0.1)
Basophils Relative: 4 %
Eosinophils Absolute: 0 10*3/uL (ref 0.0–0.5)
Eosinophils Relative: 0 %
HCT: 30.8 % — ABNORMAL LOW (ref 36.0–46.0)
Hemoglobin: 10 g/dL — ABNORMAL LOW (ref 12.0–15.0)
Immature Granulocytes: 0 %
Lymphocytes Relative: 27 %
Lymphs Abs: 1.3 10*3/uL (ref 0.7–4.0)
MCH: 30.4 pg (ref 26.0–34.0)
MCHC: 32.5 g/dL (ref 30.0–36.0)
MCV: 93.6 fL (ref 80.0–100.0)
Monocytes Absolute: 0.6 10*3/uL (ref 0.1–1.0)
Monocytes Relative: 12 %
Neutro Abs: 2.6 10*3/uL (ref 1.7–7.7)
Neutrophils Relative %: 57 %
Platelets: 236 10*3/uL (ref 150–400)
RBC: 3.29 MIL/uL — ABNORMAL LOW (ref 3.87–5.11)
RDW: 16.8 % — ABNORMAL HIGH (ref 11.5–15.5)
WBC: 4.6 10*3/uL (ref 4.0–10.5)
nRBC: 0 % (ref 0.0–0.2)

## 2018-10-09 LAB — COMPREHENSIVE METABOLIC PANEL
ALT: 86 U/L — ABNORMAL HIGH (ref 0–44)
AST: 247 U/L — ABNORMAL HIGH (ref 15–41)
Albumin: 2.8 g/dL — ABNORMAL LOW (ref 3.5–5.0)
Alkaline Phosphatase: 231 U/L — ABNORMAL HIGH (ref 38–126)
Anion gap: 10 (ref 5–15)
BUN: 13 mg/dL (ref 6–20)
CO2: 27 mmol/L (ref 22–32)
Calcium: 9.5 mg/dL (ref 8.9–10.3)
Chloride: 102 mmol/L (ref 98–111)
Creatinine, Ser: 0.71 mg/dL (ref 0.44–1.00)
GFR calc Af Amer: >60 mL/min (ref >60)
GFR calc non Af Amer: >60 mL/min (ref >60)
Glucose, Bld: 140 mg/dL — ABNORMAL HIGH (ref 70–99)
Potassium: 3.5 mmol/L (ref 3.5–5.1)
Sodium: 139 mmol/L (ref 135–145)
Total Bilirubin: 3.3 mg/dL — ABNORMAL HIGH (ref 0.3–1.2)
Total Protein: 6.7 g/dL (ref 6.5–8.1)

## 2018-10-09 MED ORDER — SODIUM CHLORIDE 0.9% FLUSH
10.0000 mL | INTRAVENOUS | Status: DC | PRN
  Administered 2018-10-09: 12:00:00 10 mL
  Filled 2018-10-09: qty 10

## 2018-10-09 MED ORDER — DIPHENHYDRAMINE HCL 50 MG/ML IJ SOLN
25.0000 mg | Freq: Once | INTRAMUSCULAR | Status: AC
  Administered 2018-10-09: 12:00:00 25 mg via INTRAVENOUS
  Filled 2018-10-09: qty 1

## 2018-10-09 MED ORDER — PALONOSETRON HCL INJECTION 0.25 MG/5ML
0.2500 mg | Freq: Once | INTRAVENOUS | Status: AC
  Administered 2018-10-09: 0.25 mg via INTRAVENOUS

## 2018-10-09 MED ORDER — SODIUM CHLORIDE 0.9 % IV SOLN
10.0000 mg | Freq: Once | INTRAVENOUS | Status: AC
  Administered 2018-10-09: 10 mg via INTRAVENOUS
  Filled 2018-10-09: qty 10

## 2018-10-09 MED ORDER — SODIUM CHLORIDE 0.9 % IV SOLN
Freq: Once | INTRAVENOUS | Status: AC
  Administered 2018-10-09: 12:00:00 via INTRAVENOUS

## 2018-10-09 MED ORDER — FAMOTIDINE IN NACL 20-0.9 MG/50ML-% IV SOLN
20.0000 mg | Freq: Once | INTRAVENOUS | Status: AC
  Administered 2018-10-09: 20 mg via INTRAVENOUS
  Filled 2018-10-09: qty 50

## 2018-10-09 MED ORDER — PALONOSETRON HCL INJECTION 0.25 MG/5ML
INTRAVENOUS | Status: AC
  Filled 2018-10-09: qty 5

## 2018-10-09 MED ORDER — ZOLPIDEM TARTRATE 10 MG PO TABS: 10.0000 mg | ORAL_TABLET | Freq: Every evening | ORAL | 0 refills | Status: DC | PRN

## 2018-10-09 MED ORDER — HEPARIN SOD (PORK) LOCK FLUSH 100 UNIT/ML IV SOLN: 250.0000 [IU] | Freq: Once | INTRAVENOUS | Status: DC | PRN

## 2018-10-09 MED ORDER — SODIUM CHLORIDE 0.9 % IV SOLN
60.0000 mg/m2 | Freq: Once | INTRAVENOUS | Status: AC
  Administered 2018-10-09: 14:00:00 120 mg via INTRAVENOUS
  Filled 2018-10-09: qty 20

## 2018-10-09 NOTE — Progress Notes (Signed)
[place], Pamela Long   CLINIC:  Medical Oncology/Hematology  PCP:  Pamela Albee, MD [place] [id number] [phone]   REASON FOR VISIT:  Follow-up for metastatic breast cancer to the liver.   BRIEF ONCOLOGIC HISTORY:    Metastatic breast cancer ([place])   07/02/2018 Initial Diagnosis    Metastatic breast cancer ([place])    09/18/2018 -  Chemotherapy    The patient had palonosetron (ALOXI) injection 0.25 mg, 0.25 mg, Intravenous,  Once, 1 of 4 cycles Administration: 0.25 mg (10/01/2018), 0.25 mg (10/09/2018) PACLitaxel (TAXOL) 120 mg in sodium chloride 0.9 % 250 mL chemo infusion (</= 32m/m2), 60 mg/m2 = 120 mg (100 % of original dose 60 mg/m2), Intravenous,  Once, 1 of 4 cycles Dose modification: 60 mg/m2 (original dose 60 mg/m2, Cycle 1, Reason: Change in LFTs) Administration: 120 mg (09/18/2018), 120 mg (10/01/2018), 120 mg (10/09/2018)  for chemotherapy treatment.       CANCER STAGING: Cancer Staging No matching staging information was found for the patient.   INTERVAL HISTORY:  [name]89y.o. female returns for routine follow-up and consideration for next cycle of chemotherapy. She is here today alone. She states that she is feeling a little better since her last visit. She states that she has been experiencing trouble sleeping, going to sleep and staying asleep. Denies any nausea, vomiting, or diarrhea. Denies any new pains. Had not noticed any recent bleeding such as epistaxis, hematuria or hematochezia. Denies recent chest pain on exertion, shortness of breath on minimal exertion, pre-syncopal episodes, or palpitations. Denies any numbness or tingling in hands or feet. Denies any recent fevers, infections, or recent hospitalizations. Patient reports appetite at 30% and energy level at 75%.   REVIEW OF SYSTEMS:  Review of Systems  Respiratory: Positive for shortness of breath.   Psychiatric/Behavioral:  Positive for sleep disturbance.  All other systems reviewed and are negative.    PAST MEDICAL/SURGICAL HISTORY:  Past Medical History:  Diagnosis Date  . Anemia   . Anxiety   . Asthma   . [place]  . Depression   . GERD (gastroesophageal reflux disease)   . Migraine   . OAB (overactive bladder)   . Seasonal allergies    Past Surgical History:  Procedure Laterality Date  . ABDOMINAL HYSTERECTOMY     breast cancer  . ANKLE RECONSTRUCTION Right 1989  . BILIARY STENT PLACEMENT N/A 09/22/2018   Procedure: BILIARY STENT PLACEMENT;  Surgeon: RRogene Houston MD;  [facility];  Service: Endoscopy;  Laterality: N/A;  . BREAST SURGERY    . COLONOSCOPY WITH PROPOFOL N/A 08/06/2018   Procedure: COLONOSCOPY WITH PROPOFOL;  Surgeon: FDanie Binder MD;  Location: [facility];  Service: Endoscopy;  Laterality: N/A;  1:30pm  . ERCP N/A 09/22/2018   Procedure: ENDOSCOPIC RETROGRADE CHOLANGIOPANCREATOGRAPHY (ERCP);  Surgeon: RRogene Houston MD;  Location: [facility];  Service: Endoscopy;  Laterality: N/A;  . FOREIGN BODY REMOVAL N/A 08/29/2017   from lip  . KNEE SURGERY Right 1989   bone spur  . MASTECTOMY  2017   bil mastectomies  . SINUSOTOMY       SOCIAL HISTORY:  Social History   Socioeconomic History  . Marital status: Single    Spouse name: Not on file  . Number of children: 1  . Years of education: 132 . Highest education level: Not on  file  Occupational History  . Occupation: disabled  Social Needs  . Financial resource strain: Somewhat hard  . Food insecurity:    Worry: Sometimes true    Inability: Sometimes true  . Transportation needs:    Medical: No    Non-medical: No  Tobacco Use  . Smoking status: Never Smoker  . Smokeless tobacco: Never Used  Substance and Sexual Activity  . Alcohol use: No  . Drug use: No  . Sexual activity: Not Currently  Lifestyle  . Physical activity:    Days per week: 0 days    Minutes  per session: 0 min  . Stress: Rather much  Relationships  . Social connections:    Talks on phone: Once a week    Gets together: Once a week    Attends religious service: 1 to 4 times per year    Active member of club or organization: Yes    Attends meetings of clubs or organizations: Never    Relationship status: Never married  . Intimate partner violence:    Fear of current or ex partner: No    Emotionally abused: No    Physically abused: No    Forced sexual activity: No  Other Topics Concern  . Not on file  Social History Narrative   Bachelors degree   Accounting   Lives with daughter Pamela Long who has autism and diabetes   Likes to sew, quilt, crafts, crochet    FAMILY HISTORY:  Family History  Problem Relation Age of Onset  . Arthritis Mother   . COPD Mother   . Depression Mother   . Diabetes Mother   . Kidney disease Father   . Heart disease Father 12  . Drug abuse Father   . Hypertension Father   . Diabetes Daughter   . Hashimoto's thyroiditis Daughter   . Irritable bowel syndrome Daughter   . Autism spectrum disorder Daughter   . Early death Maternal Grandmother        drowned  . Early death Maternal Grandfather   . Heart disease Maternal Grandfather   . Heart disease Paternal Grandfather   . Breast cancer Maternal Aunt   . Breast cancer Cousin   . Lung cancer Maternal Aunt   . Lung cancer Maternal Uncle   . Colon cancer Neg Hx     CURRENT MEDICATIONS:  Outpatient Encounter Medications as of 10/09/2018  Medication Sig Note  . antiseptic oral rinse (BIOTENE) LIQD 15 mLs by Mouth Rinse route as needed for dry mouth.   . benzonatate (TESSALON) 100 MG capsule    . capecitabine (XELODA) 500 MG tablet Take 3 tablets (1,500 mg total) by mouth 2 (two) times daily after a meal. Take for 14 days, then hold for 7 days. Repeat every 21 days.   . Cholecalciferol (VITAMIN D3 GUMMIES) 25 MCG (1000 UT) CHEW Chew 2,000-4,000 Units by mouth daily.   Marland Kitchen docusate sodium  (COLACE) 100 MG capsule Take 1 capsule (100 mg total) by mouth 2 (two) times daily as needed for mild constipation.   Marland Kitchen escitalopram (LEXAPRO) 20 MG tablet Take 1 tablet (20 mg total) by mouth daily.   . fulvestrant (FASLODEX) 250 MG/5ML injection Inject 250 mg into the muscle every 30 (thirty) days. One injection each buttock over 1-2 minutes. Warm prior to use. 09/09/2018: Received on 08/28/2018  . gabapentin (NEURONTIN) 300 MG capsule Take 600 mg by mouth at bedtime.    Marland Kitchen HYDROmorphone (DILAUDID) 2 MG tablet Take 1-2 tablets (2-4 mg  total) by mouth every 6 (six) hours as needed for severe pain.   Marland Kitchen lactulose (CHRONULAC) 10 GM/15ML solution Take 30 mLs (20 g total) by mouth 2 (two) times daily as needed for mild constipation. 09/21/2018: Pt tries to avoid - it makes her feel sick   . lidocaine (LIDODERM) 5 % Place 1 patch onto the skin daily. Remove & Discard patch within 12 hours or as directed by MD (Patient taking differently: Place 1 patch onto the skin daily as needed. Remove & Discard patch within 12 hours or as directed by MD)   . magic mouthwash w/lidocaine SOLN Take 5 mLs by mouth 4 (four) times daily as needed for mouth pain.   . naproxen sodium (ALEVE) 220 MG tablet Take 220-440 mg by mouth 2 (two) times daily as needed (for pain or headache).    . ondansetron (ZOFRAN ODT) 8 MG disintegrating tablet Take 1 tablet (8 mg total) by mouth every 8 (eight) hours as needed for nausea or vomiting.   Marland Kitchen PACLitaxel (TAXOL IV) Inject into the vein once a week.    . pantoprazole (PROTONIX) 40 MG tablet TAKE 1 TABLET BY MOUTH DAILY BEFORE BREAKFAST (Patient taking differently: Take 40 mg by mouth daily before breakfast. )   . Polyvinyl Alcohol-Povidone PF (REFRESH) 1.4-0.6 % SOLN Place 1 drop into both eyes 2 (two) times daily.    . prochlorperazine (COMPAZINE) 10 MG tablet Take 1 tablet (10 mg total) by mouth every 6 (six) hours as needed for nausea or vomiting.   . promethazine (PHENERGAN) 25 MG tablet  Take 1 tablet (25 mg total) by mouth every 6 (six) hours as needed for nausea or vomiting.   . rizatriptan (MAXALT-MLT) 10 MG disintegrating tablet Take 10 mg by mouth every 2 (two) hours as needed for migraine.    . traMADol (ULTRAM) 50 MG tablet Take 1 tablet (50 mg total) by mouth 2 (two) times daily.   Marland Kitchen TROKENDI XR 50 MG CP24 Take 50 mg by mouth 2 (two) times daily.    No facility-administered encounter medications on file as of 10/09/2018.     ALLERGIES:  Allergies  Allergen Reactions  . Corn-Containing Products Other (See Comments)    Headache and GI upset  . [place] [Pilocarpine] Other (See Comments)    Can cause liver failure   . Tape Itching and Other (See Comments)    Depending on the adhesive-blistering occurs  . Amoxicillin Hives and Other (See Comments)    Rash only DID THE REACTION INVOLVE: Swelling of the face/tongue/throat, SOB, or low BP? Sudden or severe rash/hives, skin peeling, or the inside of the mouth or nose?  Did it require medical treatment?  When did it last happen? If all above answers are "NO", may proceed with cephalosporin use.   . Caffeine Diarrhea, Nausea Only, Palpitations and Other (See Comments)    Headache  . Tetanus Toxoids Swelling and Other (See Comments)    Local reaction     PHYSICAL EXAM:  ECOG Performance status: 2  Vitals:   10/09/18 0956  BP: 110/69  Pulse: (!) 107  Resp: 18  Temp: 99 F (37.2 C)  SpO2: 97%   Filed Weights   10/09/18 0956  Weight: 172 lb (78 kg)    Physical Exam Vitals signs reviewed.  Constitutional:      Appearance: Normal appearance.  Cardiovascular:     Rate and Rhythm: Normal rate and regular rhythm.     Heart sounds: Normal heart sounds.  Pulmonary:  Effort: Pulmonary effort is normal.     Breath sounds: Normal breath sounds.  Abdominal:     General: There is no distension.     Palpations: Abdomen is soft. There is no mass.  Musculoskeletal:        General: No swelling.  Skin:     General: Skin is warm.  Neurological:     General: No focal deficit present.     Mental Status: She is alert and oriented to person, place, and time.  Psychiatric:        Mood and Affect: Mood normal.        Behavior: Behavior normal.      LABORATORY DATA:  I have reviewed the labs as listed.  CBC    Component Value Date/Time   WBC 4.6 10/09/2018 1013   RBC 3.29 (L) 10/09/2018 1013   HGB 10.0 (L) 10/09/2018 1013   HCT 30.8 (L) 10/09/2018 1013   PLT 236 10/09/2018 1013   MCV 93.6 10/09/2018 1013   MCH 30.4 10/09/2018 1013   MCHC 32.5 10/09/2018 1013   RDW 16.8 (H) 10/09/2018 1013   LYMPHSABS 1.3 10/09/2018 1013   MONOABS 0.6 10/09/2018 1013   EOSABS 0.0 10/09/2018 1013   BASOSABS 0.2 (H) 10/09/2018 1013   CMP Latest Ref Rng & Units 10/09/2018 10/05/2018 10/01/2018  Glucose 70 - 99 mg/dL 140(H) 133(H) 124(H)  BUN 6 - 20 mg/dL '13 14 12  ' Creatinine 0.44 - 1.00 mg/dL 0.71 0.74 0.82  Sodium 135 - 145 mmol/L 139 137 135  Potassium 3.5 - 5.1 mmol/L 3.5 3.4(L) 3.6  Chloride 98 - 111 mmol/L 102 105 104  CO2 22 - 32 mmol/L '27 24 22  ' Calcium 8.9 - 10.3 mg/dL 9.5 8.6(L) 8.7(L)  Total Protein 6.5 - 8.1 g/dL 6.7 6.6 6.7  Total Bilirubin 0.3 - 1.2 mg/dL 3.3(H) 4.8(H) 4.8(H)  Alkaline Phos 38 - 126 U/L 231(H) 255(H) 314(H)  AST 15 - 41 U/L 247(H) 456(H) 556(H)  ALT 0 - 44 U/L 86(H) 127(H) 146(H)       DIAGNOSTIC IMAGING:  I have independently reviewed the scans and discussed with the patient.   I have reviewed Venita Lick LPN's note and agree with the documentation.  I personally performed a face-to-face visit, made revisions and my assessment and plan is as follows.    ASSESSMENT & PLAN:   Malignant neoplasm of central portion of left breast in female, estrogen receptor positive ([place]) 1.  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  -Underwent neoadjuvant chemotherapy with dose dense AC followed by Taxol from 07/14/2015 through 11/29/2015. - Left lumpectomy plus SLNB, consistent with infiltrating lobular carcinoma, pleomorphic features, ER/PR positive, HER-2 negative, 0 out of 3 lymph nodes positive. - Zoladex 3.6 mg IM monthly started in August 2017 followed by prophylactic right breast mastectomy plus completion of left breast simple mastectomy, radiation not indicated. -Zoladex stopped due to joint pain, restarted in October 2017, exemestane started in January 2018 - Exemestane and Zoladex held secondary to joint pains, TAH and BSO in April 2018, exemestane 25 mg daily -Moved to [place] from [place] in March 2018, and was seeing Dr. Zenovia Jordan at [facility]. -Genetic testing with multicolor gene panel was negative. -In August 2018 she was switched to tamoxifen 20 mg daily.  In November 2019 she was switched to anastrozole and she took only for few weeks. -  Patient started having epigastric and right upper quadrant pains in mid December 2019. -CT CAP on 06/17/2018 shows very large hepatic mass measuring 22 cm, mildly enlarged left internal mammary node at 0.8 cm, borderline enlarged left lower neck level 4 node at 0.9 cm. - Biopsy of the liver mass on 06/26/2018 showed breast cancer, ER/PR positive and HER-2 negative. -Brain MRI in January 2020 was also negative for metastatic disease. -Faslodex started on 07/02/2018 Abemaciclib 150 mg twice daily started on 07/07/2018 and discontinued on 09/07/2018.   -PET CT scan on 07/13/2018 shows very large centrally necrotic mass involving the right and left hepatic lobes, one other hypermetabolic lesion in the left lobe.  Multiple hypermetabolic lymph nodes in the upper abdomen, left supraclavicular space.  No thoracic adenopathy, chest wall mass or pulmonary or osseous lesions. - CT PE protocol on 08/03/2018 did not demonstrate any pulmonary embolus.  No edema or consolidation.  Stable  subcentimeter left supraclavicular lymph nodes.   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  No pathological fracture or epidural extension.  Borderline to mild degenerative spinal stenosis at T11 and T12. - MRCP on 09/18/2018 shows bulky liver metastatic disease, with progression since 07/13/2018 PET CT scan.  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 -First cycle of paclitaxel on 09/18/2018 at the dose of 60 mg/m. -She has severe thrush which was treated with Diflucan.  She no longer has any thrush or mucositis. -We reviewed her blood work.  She may proceed with week 3 of paclitaxel today. -She received Xeloda tablets.  I have dosed at 1500 mg twice daily, 2 weeks on 1 week off. - I have told her to start taking it at thousand milligrams twice daily starting tonight.  She was told to take Compazine 1 tablet 30 minutes prior to taking Xeloda.  After this cycle, I plan to increase to full dose of 1500 mg twice daily.  I will also change to 3 weeks on 1 week off regimen synchronizing it with paclitaxel. -We discussed the side effects of Xeloda in detail. -She is having some sleep problems.  We will try Ambien.  2.  Hyperbilirubinemia: -She underwent MRCP on 06/23/2018 which shows new extrinsic malignant biliary stricture at the level of the proximal CBD with new scattered mild to moderate intrahepatic biliary ductal dilatation in both liver lobes. -Dr.Rehman placed a plastic stent in the hepatic duct with good biliary drainage on 09/22/2018. -Her bilirubin today improved to 3.3.  3.  Right upper quadrant pain: -She does not report any abdominal pain for the last 1 to 2 weeks.  She has not required any pain medication.           Orders placed this encounter:  No orders of the defined types were placed in this  encounter.     Derek Jack, MD [place] [phone]

## 2018-10-09 NOTE — Patient Instructions (Addendum)
[place] at [facility] Discharge Instructions  You were seen today by Dr. [name]. He went over your recent lab results. He would like you to start taking the Xeloda 2 tablets twice a day. Take compazine 30 minutes prior to taking the Xeloda. He will see you back in 1 week for labs and follow up.   Thank you for choosing [place] at [facility] to provide your oncology and hematology care.  To afford each patient quality time with our provider, please arrive at least 15 minutes before your scheduled appointment time.   If you have a lab appointment with the [facility] please come in thru the  Main Entrance and check in at the main information desk  You need to re-schedule your appointment should you arrive 10 or more minutes late.  We strive to give you quality time with our providers, and arriving late affects you and other patients whose appointments are after yours.  Also, if you no show three or more times for appointments you may be dismissed from the clinic at the providers discretion.     Again, thank you for choosing [facility].  Our hope is that these requests will decrease the amount of time that you wait before being seen by our physicians.       _____________________________________________________________  Should you have questions after your visit to [facility], please contact our office at [phone] between the hours of 8:00 a.m. and 4:30 p.m.  Voicemails left after 4:00 p.m. will not be returned until the following business day.  For prescription refill requests, have your pharmacy contact our office and allow 72 hours.    Cancer Center Support Programs:   > Cancer Support Group  2nd Tuesday of the month 1pm-2pm, Journey Room

## 2018-10-09 NOTE — Patient Instructions (Signed)

## 2018-10-09 NOTE — Progress Notes (Signed)
Labs reviewed with MD at office visit today. Proceed with treatment per MD. Elevated liver enzymes noted by MD.  Treatment given per orders. Patient tolerated it well without problems. Vitals stable and discharged home from clinic ambulatory. Follow up as scheduled.

## 2018-10-09 NOTE — Assessment & Plan Note (Addendum)
1.  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 -Underwent neoadjuvant chemotherapy with dose dense AC followed by Taxol from 07/14/2015 through 11/29/2015. - Left lumpectomy plus SLNB, consistent with infiltrating lobular carcinoma, pleomorphic features, ER/PR positive, HER-2 negative, 0 out of 3 lymph nodes positive. - Zoladex 3.6 mg IM monthly started in August 2017 followed by prophylactic right breast mastectomy plus completion of left breast simple mastectomy, radiation not indicated. -Zoladex stopped due to joint pain, restarted in October 2017, exemestane started in January 2018 - Exemestane and Zoladex held secondary to joint pains, TAH and BSO in April 2018, exemestane 25 mg daily -Moved to [place] from [place] in March 2018, and was seeing Dr. Zenovia Jordan at [facility]. -Genetic testing with multicolor gene panel was negative. -In August 2018 she was switched to tamoxifen 20 mg daily.  In November 2019 she was switched to anastrozole and she took only for few weeks. -Patient started having epigastric and right upper quadrant pains in mid December 2019. -CT CAP on 06/17/2018 shows very large hepatic mass measuring 22 cm, mildly enlarged left internal mammary node at 0.8 cm, borderline enlarged left lower neck level 4 node at 0.9 cm. - Biopsy of the liver mass on 06/26/2018 showed breast cancer, ER/PR positive and HER-2 negative. -Brain MRI in January 2020 was also negative for metastatic disease. -Faslodex started on 07/02/2018 Abemaciclib 150 mg twice daily started on 07/07/2018 and discontinued on 09/07/2018.   -PET CT scan on 07/13/2018 shows very large centrally necrotic mass involving the right and left hepatic lobes, one other hypermetabolic lesion in the left lobe.  Multiple hypermetabolic lymph nodes in the upper abdomen, left supraclavicular  space.  No thoracic adenopathy, chest wall mass or pulmonary or osseous lesions. - CT PE protocol on 08/03/2018 did not demonstrate any pulmonary embolus.  No edema or consolidation.  Stable subcentimeter left supraclavicular lymph nodes.   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  No pathological fracture or epidural extension.  Borderline to mild degenerative spinal stenosis at T11 and T12. - MRCP on 09/18/2018 shows bulky liver metastatic disease, with progression since 07/13/2018 PET CT scan.  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 -First cycle of paclitaxel on 09/18/2018 at the dose of 60 mg/m. -She has severe thrush which was treated with Diflucan.  She no longer has any thrush or mucositis. -We reviewed her blood work.  She may proceed with week 3 of paclitaxel today. -She received Xeloda tablets.  I have dosed at 1500 mg twice daily, 2 weeks on 1 week off. - I have told her to start taking it at thousand milligrams twice daily starting tonight.  She was told to take Compazine 1 tablet 30 minutes prior to taking Xeloda.  After this cycle, I plan to increase to full dose of 1500 mg twice daily.  I will also change to 3 weeks on 1 week off regimen synchronizing it with paclitaxel. -We discussed the side effects of Xeloda in detail. -She is having some sleep problems.  We will try Ambien.  2.  Hyperbilirubinemia: -She underwent MRCP on 06/23/2018 which shows new extrinsic malignant biliary stricture at the level of the proximal CBD with new scattered mild to moderate  intrahepatic biliary ductal dilatation in both liver lobes. -Dr.Rehman placed a plastic stent in the hepatic duct with good biliary drainage on 09/22/2018. -Her bilirubin today improved to 3.3.  3.  Right upper quadrant pain: -She does not report any  abdominal pain for the last 1 to 2 weeks.  She has not required any pain medication.

## 2018-10-12 ENCOUNTER — Encounter (HOSPITAL_COMMUNITY): Payer: Self-pay | Admitting: *Deleted

## 2018-10-16 ENCOUNTER — Inpatient Hospital Stay (HOSPITAL_COMMUNITY): Payer: 59

## 2018-10-16 ENCOUNTER — Inpatient Hospital Stay (HOSPITAL_BASED_OUTPATIENT_CLINIC_OR_DEPARTMENT_OTHER): Payer: 59 | Admitting: Nurse Practitioner

## 2018-10-16 ENCOUNTER — Telehealth: Payer: Self-pay

## 2018-10-16 ENCOUNTER — Other Ambulatory Visit (HOSPITAL_COMMUNITY): Payer: 59

## 2018-10-16 ENCOUNTER — Other Ambulatory Visit: Payer: Self-pay

## 2018-10-16 DIAGNOSIS — Z170 Estrogen receptor positive status [ER+]: Secondary | ICD-10-CM | POA: Diagnosis not present

## 2018-10-16 DIAGNOSIS — R5383 Other fatigue: Secondary | ICD-10-CM

## 2018-10-16 DIAGNOSIS — E876 Hypokalemia: Secondary | ICD-10-CM

## 2018-10-16 DIAGNOSIS — Z800 Family history of malignant neoplasm of digestive organs: Secondary | ICD-10-CM

## 2018-10-16 DIAGNOSIS — C50919 Malignant neoplasm of unspecified site of unspecified female breast: Secondary | ICD-10-CM

## 2018-10-16 DIAGNOSIS — Z90722 Acquired absence of ovaries, bilateral: Secondary | ICD-10-CM

## 2018-10-16 DIAGNOSIS — C50112 Malignant neoplasm of central portion of left female breast: Secondary | ICD-10-CM

## 2018-10-16 DIAGNOSIS — J45909 Unspecified asthma, uncomplicated: Secondary | ICD-10-CM

## 2018-10-16 DIAGNOSIS — R944 Abnormal results of kidney function studies: Secondary | ICD-10-CM

## 2018-10-16 DIAGNOSIS — R202 Paresthesia of skin: Secondary | ICD-10-CM

## 2018-10-16 DIAGNOSIS — D649 Anemia, unspecified: Secondary | ICD-10-CM

## 2018-10-16 DIAGNOSIS — N3281 Overactive bladder: Secondary | ICD-10-CM

## 2018-10-16 DIAGNOSIS — Z803 Family history of malignant neoplasm of breast: Secondary | ICD-10-CM

## 2018-10-16 DIAGNOSIS — Z801 Family history of malignant neoplasm of trachea, bronchus and lung: Secondary | ICD-10-CM

## 2018-10-16 DIAGNOSIS — C787 Secondary malignant neoplasm of liver and intrahepatic bile duct: Secondary | ICD-10-CM | POA: Diagnosis not present

## 2018-10-16 DIAGNOSIS — K219 Gastro-esophageal reflux disease without esophagitis: Secondary | ICD-10-CM

## 2018-10-16 DIAGNOSIS — Z9012 Acquired absence of left breast and nipple: Secondary | ICD-10-CM

## 2018-10-16 DIAGNOSIS — F329 Major depressive disorder, single episode, unspecified: Secondary | ICD-10-CM

## 2018-10-16 DIAGNOSIS — R200 Anesthesia of skin: Secondary | ICD-10-CM

## 2018-10-16 DIAGNOSIS — Z90710 Acquired absence of both cervix and uterus: Secondary | ICD-10-CM

## 2018-10-16 DIAGNOSIS — D702 Other drug-induced agranulocytosis: Secondary | ICD-10-CM

## 2018-10-16 DIAGNOSIS — Z5111 Encounter for antineoplastic chemotherapy: Secondary | ICD-10-CM | POA: Diagnosis not present

## 2018-10-16 DIAGNOSIS — Z9013 Acquired absence of bilateral breasts and nipples: Secondary | ICD-10-CM

## 2018-10-16 DIAGNOSIS — Z79899 Other long term (current) drug therapy: Secondary | ICD-10-CM

## 2018-10-16 DIAGNOSIS — F419 Anxiety disorder, unspecified: Secondary | ICD-10-CM

## 2018-10-16 LAB — COMPREHENSIVE METABOLIC PANEL
ALT: 53 U/L — ABNORMAL HIGH (ref 0–44)
AST: 108 U/L — ABNORMAL HIGH (ref 15–41)
Albumin: 2.9 g/dL — ABNORMAL LOW (ref 3.5–5.0)
Alkaline Phosphatase: 172 U/L — ABNORMAL HIGH (ref 38–126)
Anion gap: 8 (ref 5–15)
BUN: 10 mg/dL (ref 6–20)
CO2: 26 mmol/L (ref 22–32)
Calcium: 9.3 mg/dL (ref 8.9–10.3)
Chloride: 100 mmol/L (ref 98–111)
Creatinine, Ser: 0.6 mg/dL (ref 0.44–1.00)
GFR calc Af Amer: >60 mL/min (ref >60)
GFR calc non Af Amer: >60 mL/min (ref >60)
Glucose, Bld: 139 mg/dL — ABNORMAL HIGH (ref 70–99)
Potassium: 3.1 mmol/L — ABNORMAL LOW (ref 3.5–5.1)
Sodium: 134 mmol/L — ABNORMAL LOW (ref 135–145)
Total Bilirubin: 2.7 mg/dL — ABNORMAL HIGH (ref 0.3–1.2)
Total Protein: 6.8 g/dL (ref 6.5–8.1)

## 2018-10-16 LAB — CBC WITH DIFFERENTIAL/PLATELET
Abs Immature Granulocytes: 0.02 10*3/uL (ref 0.00–0.07)
Basophils Absolute: 0.1 10*3/uL (ref 0.0–0.1)
Basophils Relative: 4 %
Eosinophils Absolute: 0.1 10*3/uL (ref 0.0–0.5)
Eosinophils Relative: 6 %
HCT: 26.5 % — ABNORMAL LOW (ref 36.0–46.0)
Hemoglobin: 9.2 g/dL — ABNORMAL LOW (ref 12.0–15.0)
Immature Granulocytes: 1 %
Lymphocytes Relative: 56 %
Lymphs Abs: 0.8 10*3/uL (ref 0.7–4.0)
MCH: 31.3 pg (ref 26.0–34.0)
MCHC: 34.7 g/dL (ref 30.0–36.0)
MCV: 90.1 fL (ref 80.0–100.0)
Monocytes Absolute: 0.1 10*3/uL (ref 0.1–1.0)
Monocytes Relative: 9 %
Neutro Abs: 0.3 10*3/uL — ABNORMAL LOW (ref 1.7–7.7)
Neutrophils Relative %: 24 %
Platelets: 132 10*3/uL — ABNORMAL LOW (ref 150–400)
RBC: 2.94 MIL/uL — ABNORMAL LOW (ref 3.87–5.11)
RDW: 15.3 % (ref 11.5–15.5)
WBC: 1.4 10*3/uL — CL (ref 4.0–10.5)
nRBC: 0 % (ref 0.0–0.2)

## 2018-10-16 LAB — BILIRUBIN, DIRECT: Bilirubin, Direct: 1 mg/dL — ABNORMAL HIGH (ref 0.0–0.2)

## 2018-10-16 MED ORDER — FILGRASTIM 480 MCG/1.6ML IJ SOLN
480.0000 ug | Freq: Once | INTRAMUSCULAR | Status: DC
  Filled 2018-10-16: qty 1.6

## 2018-10-16 MED ORDER — HEPARIN SOD (PORK) LOCK FLUSH 100 UNIT/ML IV SOLN
250.0000 [IU] | Freq: Once | INTRAVENOUS | Status: AC
  Administered 2018-10-16: 250 [IU] via INTRAVENOUS

## 2018-10-16 MED ORDER — SODIUM CHLORIDE 0.9% FLUSH
20.0000 mL | INTRAVENOUS | Status: DC | PRN
  Administered 2018-10-16: 20 mL via INTRAVENOUS
  Filled 2018-10-16: qty 20

## 2018-10-16 MED ORDER — POTASSIUM CHLORIDE CRYS ER 20 MEQ PO TBCR
40.0000 meq | EXTENDED_RELEASE_TABLET | Freq: Once | ORAL | Status: AC
  Administered 2018-10-16: 12:00:00 40 meq via ORAL

## 2018-10-16 MED ORDER — POTASSIUM CHLORIDE CRYS ER 20 MEQ PO TBCR
EXTENDED_RELEASE_TABLET | ORAL | Status: AC
  Filled 2018-10-16: qty 2

## 2018-10-16 MED ORDER — FILGRASTIM 480 MCG/0.8ML IJ SOSY
480.0000 ug | PREFILLED_SYRINGE | Freq: Once | INTRAMUSCULAR | Status: AC
  Administered 2018-10-16: 480 ug via SUBCUTANEOUS
  Filled 2018-10-16: qty 0.8

## 2018-10-16 NOTE — Telephone Encounter (Signed)
Nutrition  Patient identified on Malnutrition Screening report for weight loss and poor appetite.    Called and left message on voice mail with call back number.    Samika Vetsch B. Zenia Resides, [place], [place] Registered Dietitian [phone] (pager)

## 2018-10-16 NOTE — Patient Instructions (Signed)
[place] at [facility] Discharge Instructions  Labs on Monday Follow up on Friday with labs, treatment, and office visit.   Thank you for choosing [place] at [facility] to provide your oncology and hematology care.  To afford each patient quality time with our provider, please arrive at least 15 minutes before your scheduled appointment time.   If you have a lab appointment with the [facility] please come in thru the  Main Entrance and check in at the main information desk  You need to re-schedule your appointment should you arrive 10 or more minutes late.  We strive to give you quality time with our providers, and arriving late affects you and other patients whose appointments are after yours.  Also, if you no show three or more times for appointments you may be dismissed from the clinic at the providers discretion.     Again, thank you for choosing [facility].  Our hope is that these requests will decrease the amount of time that you wait before being seen by our physicians.       _____________________________________________________________  Should you have questions after your visit to [facility], please contact our office at [phone] between the hours of 8:00 a.m. and 4:30 p.m.  Voicemails left after 4:00 p.m. will not be returned until the following business day.  For prescription refill requests, have your pharmacy contact our office and allow 72 hours.    Cancer Center Support Programs:   > Cancer Support Group  2nd Tuesday of the month 1pm-2pm, Journey Room

## 2018-10-16 NOTE — Addendum Note (Signed)
Addended by: Henreitta Leber E on: 10/16/2018 12:20 PM   Modules accepted: Orders

## 2018-10-16 NOTE — Addendum Note (Signed)
Addended by: [place] on: 10/16/2018 11:55 AM   Modules accepted: Orders

## 2018-10-16 NOTE — Progress Notes (Signed)
CRITICAL VALUE ALERT  Critical Value:  WBC 1.4  Date & Time Notied:  10/16/2018 at 1109  Provider Notified: Cristela Felt, NP  Orders Received/Actions taken: administer Neupogen and K-Dur 40 mEq po

## 2018-10-16 NOTE — Assessment & Plan Note (Addendum)
1.  Metastatic breast cancer to the liver: - She was diagnosed with left breast cancer June 29, 2015 in [place] biopsy showed infiltrative ductal carcinoma, ER/PR positive and Her-2 negative. - She underwent neoadjuvant chemotherapy with dose dense AC followed by Taxol from 07/14/2015 through 11/29/2015. - She had a left lumpectomy plus sentinel lymph node biopsy consistent with infiltrating lobular carcinoma, pleomorphic features, ER/PR positive, HER2 negative, 0 out of 3 lymph nodes positive. - In August 2017 she had a prophylactic right breast mastectomy plus completion of left breast simple mastectomy radiation not indicated. -She moved to [place] from [place] in March 2018. - In August 2018 she was switched to tamoxifen 20 mg daily.  In November 2019 she was switched to anastrozole and took only for a few weeks. -She started having epigastric and right upper quadrant pains in mid December 2019 - CT CAP on 06/17/2018 showed a very large hepatic mass measuring 22 cm, mildly enlarged left internal mamillary node at 0.8 cm, borderline enlarged left lower neck lymph node level 4 at 0.9 cm. -Biopsy of the liver mass on 06/26/2018 showed breast cancer, ER/PR positive and HER-2 negative. -MRI of the brain in January 2020 was also negative for metastatic disease. -Faslodex started on 07/02/2018 and Verzenio 150 mg twice daily started on 07/07/2018. -PET CT scan on 07/13/2018 showed very large centrally necrotic mass involving the right and left hepatic lobes, one other hypermetabolic lesion in the left lobe.  Multiple hypermetabolic lymph nodes in the upper abdomen, left supraclavicular space.  No thoracic adenopathy, chest wall mass or pulmonary or osseous lesions. -She is continuing to have pains on her right side lower chest wall from her back to her abdomen.  We think these are likely due from her liver lesion. -She is taking ibuprofen 800 mg in the morning. - She is having bruising on her lower  extremities and upper extremities.  She feels every time she gets slightly bumped she bruises. - She feels she is not taking in enough fluids her heart rate is 114. She feels like she does not need fluids today. - She is scheduled for an MRI of her back on Monday for her back pain and we will see her back on Tuesday for the results.  She has plenty of pain medications to get her through the weekend. -She will get her Faslodex injection today. - She initially started taking Xeloda tablets 1000 mg twice a day, 2 weeks on 1 week off.  She was supposed to be increased to 1500 mg twice a day however she started having leg swelling and foot pain.  So she discontinued them. - On 10/12/2018 we started her back on the Xeloda 500 mg twice a day, continue with the 2 weeks on and one-week off. -Upon physical assessment she had no skin peeling or breakdown on her hands or feet.  She did have some numbness and tingling in her hands and feet.  She does have mouth sores and is using the mouthwash which is helping.  She had no yeast. - With labs 10/16/2018 showed her WBC at 1.4.  We gave her Neupogen 480.  We are stopping the Xeloda.  And she will follow-up with Dr. [name] next week with treatment.  2.cytopenias: - She has had mild thrombocytopenia and anemia since the start of her Verzenio.  -Labs today on 10/16/2018 were platelets 132, hemoglobin is 9.2.  Her AST is down today at 108.  WBC 1.4  3.intermittent rectal bleeding: -  Colonoscopy on 08/06/2018 showed normal ileum redundant colon, and internal and external hemorrhoids. -She reports she has not had any more bleeding and she only notices it when she is constipated.  4.elevated creatinine: -This is likely due to the Verzenio. - Her creatinine today on 10/16/2018 is 0.60. - Has returned to normal we will continue to monitor.  5.  Hyperbilirubinemia: - She underwent MRCP on 06/23/2018 which showed new extrinsic malignant biliary stricture at the level of the  proximal CBD with new scattered mid to moderate intrahepatic biliary ductal dilatation in both liver lobes. - Dr. Laural Golden placed a plastic stent in her hepatic duct with good biliary drainage on 09/22/2018. - Labs on 10/16/2018 showed her total bilirubin has improved to 2.7.

## 2018-10-16 NOTE — Progress Notes (Signed)
[facility] [place], [place] [address]   CLINIC:  Medical Oncology/Hematology  PCP:  Doree Albee, MD [place] [address] [phone]   REASON FOR VISIT: Follow-up for metastatic breast cancer  CURRENT THERAPY: Paclitaxel 3 weeks on 1 week off and Xeloda stopped for now  BRIEF ONCOLOGIC HISTORY:    Metastatic breast cancer ([place])   07/02/2018 Initial Diagnosis    Metastatic breast cancer ([place])    09/18/2018 -  Chemotherapy    The patient had palonosetron (ALOXI) injection 0.25 mg, 0.25 mg, Intravenous,  Once, 1 of 4 cycles Administration: 0.25 mg (10/01/2018), 0.25 mg (10/09/2018) PACLitaxel (TAXOL) 120 mg in sodium chloride 0.9 % 250 mL chemo infusion (</= 51m/m2), 60 mg/m2 = 120 mg (100 % of original dose 60 mg/m2), Intravenous,  Once, 1 of 4 cycles Dose modification: 60 mg/m2 (original dose 60 mg/m2, Cycle 1, Reason: Change in LFTs) Administration: 120 mg (09/18/2018), 120 mg (10/01/2018), 120 mg (10/09/2018)  for chemotherapy treatment.       INTERVAL HISTORY:  Ms. 40. female returns for routine follow-up for metastatic breast cancer.  She reports she is fatigued daily.  She has mouth sores that she is using mouthwash for 4 times a day.  It has helped some.  She does not have any yeast at this time.  She is having nosebleeds.  She does have numbness and tingling in her hands and feet it is mild and is not constant.  Denies any skin peeling or pain. Denies any nausea, vomiting, or diarrhea. Denies any new pains. Had not noticed any recent bleeding such as epistaxis, hematuria or hematochezia. Denies recent chest pain on exertion, shortness of breath on minimal exertion, pre-syncopal episodes, or palpitations. Denies any numbness or tingling in hands or feet. Denies any recent fevers, infections, or recent hospitalizations. Patient reports appetite at 50% and energy level at 25%.  She is in taking small amounts of food.     REVIEW OF SYSTEMS:  Review of Systems  Constitutional: Positive for fatigue.  HENT:   Positive for mouth sores, nosebleeds and trouble swallowing.   Gastrointestinal: Positive for constipation.  Skin: Positive for itching.  Neurological: Positive for numbness.  All other systems reviewed and are negative.    PAST MEDICAL/SURGICAL HISTORY:  Past Medical History:  Diagnosis Date  . Anemia   . Anxiety   . Asthma   . Breast cancer ([place]    Left, 2017  . Depression   . GERD (gastroesophageal reflux disease)   . Migraine   . OAB (overactive bladder)   . Seasonal allergies    Past Surgical History:  Procedure Laterality Date  . ABDOMINAL HYSTERECTOMY     breast cancer  . ANKLE RECONSTRUCTION Right 1989  . BILIARY STENT PLACEMENT N/A 09/22/2018   Procedure: BILIARY STENT PLACEMENT;  Surgeon: RRogene Houston MD;  [facility];  Service: Endoscopy;  Laterality: N/A;  . BREAST SURGERY    . COLONOSCOPY WITH PROPOFOL N/A 08/06/2018   Procedure: COLONOSCOPY WITH PROPOFOL;  Surgeon: FDanie Binder MD;  Location[facility];  Service: Endoscopy;  Laterality: N/A;  1:30pm  . ERCP N/A 09/22/2018   Procedure: ENDOSCOPIC RETROGRADE CHOLANGIOPANCREATOGRAPHY (ERCP);  Surgeon: RRogene Houston MD;  Location[facility];  Service: Endoscopy;  Laterality: N/A;  . FOREIGN BODY REMOVAL N/A 08/29/2017   from lip  . KNEE SURGERY Right 1989   bone spur  . MASTECTOMY  2017   bil mastectomies  . SINUSOTOMY       SOCIAL HISTORY:  Social History   Socioeconomic History  . Marital status: Single    Spouse name: Not on file  . Number of children: 1  . Years of education: 83  . Highest education level: Not on file  Occupational History  . Occupation: disabled  Social Needs  . Financial resource strain: Somewhat hard  . Food insecurity:    Worry: Sometimes true    Inability: Sometimes true  . Transportation needs:    Medical: No    Non-medical: No  Tobacco Use  .  Smoking status: Never Smoker  . Smokeless tobacco: Never Used  Substance and Sexual Activity  . Alcohol use: No  . Drug use: No  . Sexual activity: Not Currently  Lifestyle  . Physical activity:    Days per week: 0 days    Minutes per session: 0 min  . Stress: Rather much  Relationships  . Social connections:    Talks on phone: Once a week    Gets together: Once a week    Attends religious service: 1 to 4 times per year    Active member of club or organization: Yes    Attends meetings of clubs or organizations: Never    Relationship status: Never married  . Intimate partner violence:    Fear of current or ex partner: No    Emotionally abused: No    Physically abused: No    Forced sexual activity: No  Other Topics Concern  . Not on file  Social History Narrative   Bachelors degree   Accounting   Lives with daughter [name] who has autism and diabetes   Likes to sew, quilt, crafts, crochet    FAMILY HISTORY:  Family History  Problem Relation Age of Onset  . Arthritis Mother   . COPD Mother   . Depression Mother   . Diabetes Mother   . Kidney disease Father   . Heart disease Father 8  . Drug abuse Father   . Hypertension Father   . Diabetes Daughter   . Hashimoto's thyroiditis Daughter   . Irritable bowel syndrome Daughter   . Autism spectrum disorder Daughter   . Early death Maternal Grandmother        drowned  . Early death Maternal Grandfather   . Heart disease Maternal Grandfather   . Heart disease Paternal Grandfather   . Breast cancer Maternal Aunt   . Breast cancer Cousin   . Lung cancer Maternal Aunt   . Lung cancer Maternal Uncle   . Colon cancer Neg Hx     CURRENT MEDICATIONS:  Outpatient Encounter Medications as of 10/16/2018  Medication Sig Note  . antiseptic oral rinse (BIOTENE) LIQD 15 mLs by Mouth Rinse route as needed for dry mouth.   . capecitabine (XELODA) 500 MG tablet Take 3 tablets (1,500 mg total) by mouth 2 (two) times daily after a  meal. Take for 14 days, then hold for 7 days. Repeat every 21 days.   Marland Kitchen PACLitaxel (TAXOL IV) Inject into the vein once a week.    . prochlorperazine (COMPAZINE) 10 MG tablet Take 1 tablet (10 mg total) by mouth every 6 (six) hours as needed for nausea or vomiting.   . benzonatate (TESSALON) 100 MG capsule    . Cholecalciferol (VITAMIN D3 GUMMIES) 25 MCG (1000 UT) CHEW Chew 2,000-4,000 Units by mouth daily.   Marland Kitchen docusate sodium (COLACE) 100 MG capsule  Take 1 capsule (100 mg total) by mouth 2 (two) times daily as needed for mild constipation. (Patient not taking: Reported on 10/16/2018)   . escitalopram (LEXAPRO) 20 MG tablet Take 1 tablet (20 mg total) by mouth daily. (Patient not taking: Reported on 10/16/2018)   . fulvestrant (FASLODEX) 250 MG/5ML injection Inject 250 mg into the muscle every 30 (thirty) days. One injection each buttock over 1-2 minutes. Warm prior to use. 09/09/2018: Received on 08/28/2018  . gabapentin (NEURONTIN) 300 MG capsule Take 600 mg by mouth at bedtime.    Marland Kitchen HYDROmorphone (DILAUDID) 2 MG tablet Take 1-2 tablets (2-4 mg total) by mouth every 6 (six) hours as needed for severe pain. (Patient not taking: Reported on 10/16/2018)   . lactulose (CHRONULAC) 10 GM/15ML solution Take 30 mLs (20 g total) by mouth 2 (two) times daily as needed for mild constipation. (Patient not taking: Reported on 10/16/2018) 09/21/2018: Pt tries to avoid - it makes her feel sick   . lidocaine (LIDODERM) 5 % Place 1 patch onto the skin daily. Remove & Discard patch within 12 hours or as directed by MD (Patient not taking: Reported on 10/16/2018)   . magic mouthwash w/lidocaine SOLN Take 5 mLs by mouth 4 (four) times daily as needed for mouth pain. (Patient not taking: Reported on 10/16/2018)   . naproxen sodium (ALEVE) 220 MG tablet Take 220-440 mg by mouth 2 (two) times daily as needed (for pain or headache).    . ondansetron (ZOFRAN ODT) 8 MG disintegrating tablet Take 1 tablet (8 mg total) by mouth every 8 (eight)  hours as needed for nausea or vomiting. (Patient not taking: Reported on 10/16/2018)   . pantoprazole (PROTONIX) 40 MG tablet TAKE 1 TABLET BY MOUTH DAILY BEFORE BREAKFAST (Patient not taking: No sig reported)   . Polyvinyl Alcohol-Povidone PF (REFRESH) 1.4-0.6 % SOLN Place 1 drop into both eyes 2 (two) times daily.    . promethazine (PHENERGAN) 25 MG tablet Take 1 tablet (25 mg total) by mouth every 6 (six) hours as needed for nausea or vomiting. (Patient not taking: Reported on 10/16/2018)   . rizatriptan (MAXALT) 10 MG tablet TAKE 1 TABLET AT ONSET AFTER 2 HOURS NO RELIEF TAKE 1 TABLET. DO NOT EXCEED OVER 30MG/24HR.   . rizatriptan (MAXALT-MLT) 10 MG disintegrating tablet Take 10 mg by mouth every 2 (two) hours as needed for migraine.    . traMADol (ULTRAM) 50 MG tablet Take 1 tablet (50 mg total) by mouth 2 (two) times daily. (Patient not taking: Reported on 10/16/2018)   . TROKENDI XR 50 MG CP24 Take 50 mg by mouth 2 (two) times daily.   Marland Kitchen zolpidem (AMBIEN) 10 MG tablet Take 1 tablet (10 mg total) by mouth at bedtime as needed for up to 30 days for sleep. (Patient not taking: Reported on 10/16/2018)    No facility-administered encounter medications on file as of 10/16/2018.     ALLERGIES:  Allergies  Allergen Reactions  . Corn-Containing Products Other (See Comments)    Headache and GI upset  . Salagen [Pilocarpine] Other (See Comments)    Can cause liver failure   . Tape Itching and Other (See Comments)    Depending on the adhesive-blistering occurs  . Amoxicillin Hives and Other (See Comments)    Rash only DID THE REACTION INVOLVE: Swelling of the face/tongue/throat, SOB, or low BP? Sudden or severe rash/hives, skin peeling, or the inside of the mouth or nose?  Did it require medical treatment?  When  did it last happen? If all above answers are "NO", may proceed with cephalosporin use.   . Caffeine Diarrhea, Nausea Only, Palpitations and Other (See Comments)    Headache  . Tetanus  Toxoids Swelling and Other (See Comments)    Local reaction     PHYSICAL EXAM:  ECOG Performance status: 1  Vitals:   10/16/18 1020  BP: (!) 123/50  Pulse: (!) 114  Resp: 16  Temp: 98.9 F (37.2 C)  SpO2: 98%   Filed Weights   10/16/18 1020  Weight: 173 lb (78.5 kg)    Physical Exam Constitutional:      Appearance: Normal appearance. She is normal weight.  Cardiovascular:     Rate and Rhythm: Normal rate and regular rhythm.     Heart sounds: Normal heart sounds.  Pulmonary:     Effort: Pulmonary effort is normal.     Breath sounds: Normal breath sounds.  Abdominal:     General: Bowel sounds are normal.     Palpations: Abdomen is soft.  Musculoskeletal: Normal range of motion.  Skin:    General: Skin is warm and dry.  Neurological:     Mental Status: She is alert and oriented to person, place, and time. Mental status is at baseline.  Psychiatric:        Mood and Affect: Mood normal.        Behavior: Behavior normal.        Thought Content: Thought content normal.        Judgment: Judgment normal.      LABORATORY DATA:  I have reviewed the labs as listed.  CBC    Component Value Date/Time   WBC 1.4 (LL) 10/16/2018 1037   RBC 2.94 (L) 10/16/2018 1037   HGB 9.2 (L) 10/16/2018 1037   HCT 26.5 (L) 10/16/2018 1037   PLT 132 (L) 10/16/2018 1037   MCV 90.1 10/16/2018 1037   MCH 31.3 10/16/2018 1037   MCHC 34.7 10/16/2018 1037   RDW 15.3 10/16/2018 1037   LYMPHSABS 0.8 10/16/2018 1037   MONOABS 0.1 10/16/2018 1037   EOSABS 0.1 10/16/2018 1037   BASOSABS 0.1 10/16/2018 1037   CMP Latest Ref Rng & Units 10/16/2018 10/09/2018 10/05/2018  Glucose 70 - 99 mg/dL 139(H) 140(H) 133(H)  BUN 6 - 20 mg/dL '10 13 14  ' Creatinine 0.44 - 1.00 mg/dL 0.60 0.71 0.74  Sodium 135 - 145 mmol/L 134(L) 139 137  Potassium 3.5 - 5.1 mmol/L 3.1(L) 3.5 3.4(L)  Chloride 98 - 111 mmol/L 100 102 105  CO2 22 - 32 mmol/L '26 27 24  ' Calcium 8.9 - 10.3 mg/dL 9.3 9.5 8.6(L)  Total Protein 6.5  - 8.1 g/dL 6.8 6.7 6.6  Total Bilirubin 0.3 - 1.2 mg/dL 2.7(H) 3.3(H) 4.8(H)  Alkaline Phos 38 - 126 U/L 172(H) 231(H) 255(H)  AST 15 - 41 U/L 108(H) 247(H) 456(H)  ALT 0 - 44 U/L 53(H) 86(H) 127(H)   I personally performed a face-to-face visit.  All questions were answered to patient's stated satisfaction. Encouraged patient to call with any new concerns or questions before his next visit to the cancer center and we can certain see him sooner, if needed.     ASSESSMENT & PLAN:   Metastatic breast cancer ([place]) 1.  Metastatic breast cancer to the liver: - She was diagnosed with left breast cancer June 29, 2015 in [place] biopsy showed infiltrative ductal carcinoma, ER/PR positive and Her-2 negative. - She underwent neoadjuvant chemotherapy with dose dense AC followed by  Taxol from 07/14/2015 through 11/29/2015. - She had a left lumpectomy plus sentinel lymph node biopsy consistent with infiltrating lobular carcinoma, pleomorphic features, ER/PR positive, HER2 negative, 0 out of 3 lymph nodes positive. - In August 2017 she had a prophylactic right breast mastectomy plus completion of left breast simple mastectomy radiation not indicated. -She moved to [place] from [place] in March 2018. - In August 2018 she was switched to tamoxifen 20 mg daily.  In November 2019 she was switched to anastrozole and took only for a few weeks. -She started having epigastric and right upper quadrant pains in mid December 2019 - CT CAP on 06/17/2018 showed a very large hepatic mass measuring 22 cm, mildly enlarged left internal mamillary node at 0.8 cm, borderline enlarged left lower neck lymph node level 4 at 0.9 cm. -Biopsy of the liver mass on 06/26/2018 showed breast cancer, ER/PR positive and HER-2 negative. -MRI of the brain in January 2020 was also negative for metastatic disease. -Faslodex started on 07/02/2018 and Verzenio 150 mg twice daily started on 07/07/2018. -PET CT scan on 07/13/2018 showed very  large centrally necrotic mass involving the right and left hepatic lobes, one other hypermetabolic lesion in the left lobe.  Multiple hypermetabolic lymph nodes in the upper abdomen, left supraclavicular space.  No thoracic adenopathy, chest wall mass or pulmonary or osseous lesions. -She is continuing to have pains on her right side lower chest wall from her back to her abdomen.  We think these are likely due from her liver lesion. -She is taking ibuprofen 800 mg in the morning. - She is having bruising on her lower extremities and upper extremities.  She feels every time she gets slightly bumped she bruises. - She feels she is not taking in enough fluids her heart rate is 114. She feels like she does not need fluids today. - She is scheduled for an MRI of her back on Monday for her back pain and we will see her back on Tuesday for the results.  She has plenty of pain medications to get her through the weekend. -She will get her Faslodex injection today. - She initially started taking Xeloda tablets 1000 mg twice a day, 2 weeks on 1 week off.  She was supposed to be increased to 1500 mg twice a day however she started having leg swelling and foot pain.  So she discontinued them. - On 10/12/2018 we started her back on the Xeloda 500 mg twice a day, continue with the 2 weeks on and one-week off. -Upon physical assessment she had no skin peeling or breakdown on her hands or feet.  She did have some numbness and tingling in her hands and feet.  She does have mouth sores and is using the mouthwash which is helping.  She had no yeast. - With labs 10/16/2018 showed her WBC at 1.4.  We gave her Neupogen 480.  We are stopping the Xeloda.  And she will follow-up with Dr. [name] next week with treatment.  2.cytopenias: - She has had mild thrombocytopenia and anemia since the start of her Verzenio.  -Labs today on 10/16/2018 were platelets 132, hemoglobin is 9.2.  Her AST is down today at 108.  WBC 1.4   3.intermittent rectal bleeding: - Colonoscopy on 08/06/2018 showed normal ileum redundant colon, and internal and external hemorrhoids. -She reports she has not had any more bleeding and she only notices it when she is constipated.  4.elevated creatinine: -This is likely due to the Verzenio. -  Her creatinine today on 10/16/2018 is 0.60. - Has returned to normal we will continue to monitor.  5.  Hyperbilirubinemia: - She underwent MRCP on 06/23/2018 which showed new extrinsic malignant biliary stricture at the level of the proximal CBD with new scattered mid to moderate intrahepatic biliary ductal dilatation in both liver lobes. - Dr. Laural Golden placed a plastic stent in her hepatic duct with good biliary drainage on 09/22/2018. - Labs on 10/16/2018 showed her total bilirubin has improved to 2.7.      Orders placed this encounter:  Orders Placed This Encounter  Procedures  . Cancer antigen 15-3  . Cancer antigen 27.29  . Magnesium  . CBC with Differential/Platelet  . Comprehensive metabolic panel  . Lactate dehydrogenase  . Lactate dehydrogenase  . Magnesium  . CBC with Differential/Platelet  . Comprehensive metabolic panel      Francene Finders, FNP-C [place] [phone]

## 2018-10-19 ENCOUNTER — Inpatient Hospital Stay (HOSPITAL_COMMUNITY): Payer: 59

## 2018-10-19 ENCOUNTER — Encounter (HOSPITAL_COMMUNITY): Payer: Self-pay | Admitting: *Deleted

## 2018-10-19 ENCOUNTER — Other Ambulatory Visit: Payer: Self-pay

## 2018-10-19 DIAGNOSIS — Z5111 Encounter for antineoplastic chemotherapy: Secondary | ICD-10-CM | POA: Diagnosis not present

## 2018-10-19 DIAGNOSIS — C50919 Malignant neoplasm of unspecified site of unspecified female breast: Secondary | ICD-10-CM

## 2018-10-19 LAB — CBC WITH DIFFERENTIAL/PLATELET
Basophils Absolute: 0.2 10*3/uL — ABNORMAL HIGH (ref 0.0–0.1)
Basophils Relative: 3 %
Eosinophils Absolute: 0.1 10*3/uL (ref 0.0–0.5)
Eosinophils Relative: 2 %
HCT: 28.7 % — ABNORMAL LOW (ref 36.0–46.0)
Hemoglobin: 9.6 g/dL — ABNORMAL LOW (ref 12.0–15.0)
Lymphocytes Relative: 36 %
Lymphs Abs: 1.9 10*3/uL (ref 0.7–4.0)
MCH: 30.7 pg (ref 26.0–34.0)
MCHC: 33.4 g/dL (ref 30.0–36.0)
MCV: 91.7 fL (ref 80.0–100.0)
Metamyelocytes Relative: 5 %
Monocytes Absolute: 0.6 10*3/uL (ref 0.1–1.0)
Monocytes Relative: 11 %
Myelocytes: 6 %
Neutro Abs: 1.9 10*3/uL (ref 1.7–7.7)
Neutrophils Relative %: 36 %
Platelets: 166 10*3/uL (ref 150–400)
Promyelocytes Relative: 1 %
RBC: 3.13 MIL/uL — ABNORMAL LOW (ref 3.87–5.11)
RDW: 16.3 % — ABNORMAL HIGH (ref 11.5–15.5)
WBC: 5.2 10*3/uL (ref 4.0–10.5)
nRBC: 3.3 % — ABNORMAL HIGH (ref 0.0–0.2)

## 2018-10-19 LAB — COMPREHENSIVE METABOLIC PANEL
ALT: 46 U/L — ABNORMAL HIGH (ref 0–44)
AST: 82 U/L — ABNORMAL HIGH (ref 15–41)
Albumin: 3 g/dL — ABNORMAL LOW (ref 3.5–5.0)
Alkaline Phosphatase: 163 U/L — ABNORMAL HIGH (ref 38–126)
Anion gap: 10 (ref 5–15)
BUN: 9 mg/dL (ref 6–20)
CO2: 26 mmol/L (ref 22–32)
Calcium: 9.1 mg/dL (ref 8.9–10.3)
Chloride: 103 mmol/L (ref 98–111)
Creatinine, Ser: 0.65 mg/dL (ref 0.44–1.00)
GFR calc Af Amer: >60 mL/min (ref >60)
GFR calc non Af Amer: >60 mL/min (ref >60)
Glucose, Bld: 171 mg/dL — ABNORMAL HIGH (ref 70–99)
Potassium: 3.1 mmol/L — ABNORMAL LOW (ref 3.5–5.1)
Sodium: 139 mmol/L (ref 135–145)
Total Bilirubin: 2 mg/dL — ABNORMAL HIGH (ref 0.3–1.2)
Total Protein: 6.6 g/dL (ref 6.5–8.1)

## 2018-10-19 LAB — LACTATE DEHYDROGENASE: LDH: 589 U/L — ABNORMAL HIGH (ref 98–192)

## 2018-10-19 LAB — MAGNESIUM: Magnesium: 1.4 mg/dL — ABNORMAL LOW (ref 1.7–2.4)

## 2018-10-19 MED ORDER — HEPARIN SOD (PORK) LOCK FLUSH 100 UNIT/ML IV SOLN
250.0000 [IU] | Freq: Once | INTRAVENOUS | Status: AC
  Administered 2018-10-19: 250 [IU] via INTRAVENOUS

## 2018-10-19 MED ORDER — SODIUM CHLORIDE 0.9% FLUSH
20.0000 mL | Freq: Once | INTRAVENOUS | Status: AC
  Administered 2018-10-19: 11:00:00 20 mL via INTRAVENOUS

## 2018-10-19 MED ORDER — MAGNESIUM OXIDE 400 (241.3 MG) MG PO TABS: 400.0000 mg | ORAL_TABLET | Freq: Every day | ORAL | 0 refills | Status: DC

## 2018-10-19 NOTE — Patient Instructions (Signed)
[place] at [facility]  Discharge Instructions:  PICC line flushed and lab work drawn today. _______________________________________________________________  Thank you for choosing [place] at [facility] to provide your oncology and hematology care.  To afford each patient quality time with our providers, please arrive at least 15 minutes before your scheduled appointment.  You need to re-schedule your appointment if you arrive 10 or more minutes late.  We strive to give you quality time with our providers, and arriving late affects you and other patients whose appointments are after yours.  Also, if you no show three or more times for appointments you may be dismissed from the clinic.  Again, thank you for choosing [place] at [place] hope is that these requests will allow you access to exceptional care and in a timely manner. _______________________________________________________________  If you have questions after your visit, please contact our office at [phone] between the hours of 8:30 a.m. and 5:00 p.m. Voicemails left after 4:30 p.m. will not be returned until the following business day. _______________________________________________________________  For prescription refill requests, have your pharmacy contact our office. _______________________________________________________________  Recommendations made by the consultant and any test results will be sent to your referring physician. _______________________________________________________________

## 2018-10-19 NOTE — Addendum Note (Signed)
Addended by: [place]ie [place] on: 10/19/2018 12:03 PM   Modules accepted: Orders

## 2018-10-19 NOTE — Progress Notes (Signed)
Lab work reviewed with Francene Finders, FNP and patient ok to discharge and be seen this Friday. Discharged self ambulatory in satisfactory condition.

## 2018-10-20 LAB — CANCER ANTIGEN 15-3: CA 15-3: 603 U/mL — ABNORMAL HIGH (ref 0.0–25.0)

## 2018-10-20 LAB — CANCER ANTIGEN 27.29: CA 27.29: 816.2 U/mL — ABNORMAL HIGH (ref 0.0–38.6)

## 2018-10-21 ENCOUNTER — Encounter (HOSPITAL_COMMUNITY): Payer: Self-pay | Admitting: Nurse Practitioner

## 2018-10-23 ENCOUNTER — Encounter (HOSPITAL_COMMUNITY): Payer: Self-pay | Admitting: Hematology

## 2018-10-23 ENCOUNTER — Inpatient Hospital Stay (HOSPITAL_COMMUNITY): Payer: 59

## 2018-10-23 ENCOUNTER — Other Ambulatory Visit: Payer: Self-pay

## 2018-10-23 ENCOUNTER — Inpatient Hospital Stay (HOSPITAL_BASED_OUTPATIENT_CLINIC_OR_DEPARTMENT_OTHER): Payer: 59 | Admitting: Hematology

## 2018-10-23 VITALS — BP 117/62 | HR 95 | Temp 98.7°F | Resp 18

## 2018-10-23 DIAGNOSIS — Z803 Family history of malignant neoplasm of breast: Secondary | ICD-10-CM

## 2018-10-23 DIAGNOSIS — R200 Anesthesia of skin: Secondary | ICD-10-CM

## 2018-10-23 DIAGNOSIS — J45909 Unspecified asthma, uncomplicated: Secondary | ICD-10-CM

## 2018-10-23 DIAGNOSIS — C50919 Malignant neoplasm of unspecified site of unspecified female breast: Secondary | ICD-10-CM

## 2018-10-23 DIAGNOSIS — N3281 Overactive bladder: Secondary | ICD-10-CM

## 2018-10-23 DIAGNOSIS — Z5111 Encounter for antineoplastic chemotherapy: Secondary | ICD-10-CM

## 2018-10-23 DIAGNOSIS — Z90722 Acquired absence of ovaries, bilateral: Secondary | ICD-10-CM

## 2018-10-23 DIAGNOSIS — Z170 Estrogen receptor positive status [ER+]: Secondary | ICD-10-CM

## 2018-10-23 DIAGNOSIS — C50112 Malignant neoplasm of central portion of left female breast: Secondary | ICD-10-CM | POA: Diagnosis not present

## 2018-10-23 DIAGNOSIS — R944 Abnormal results of kidney function studies: Secondary | ICD-10-CM

## 2018-10-23 DIAGNOSIS — Z801 Family history of malignant neoplasm of trachea, bronchus and lung: Secondary | ICD-10-CM

## 2018-10-23 DIAGNOSIS — C787 Secondary malignant neoplasm of liver and intrahepatic bile duct: Secondary | ICD-10-CM | POA: Diagnosis not present

## 2018-10-23 DIAGNOSIS — K5900 Constipation, unspecified: Secondary | ICD-10-CM

## 2018-10-23 DIAGNOSIS — F419 Anxiety disorder, unspecified: Secondary | ICD-10-CM

## 2018-10-23 DIAGNOSIS — Z9013 Acquired absence of bilateral breasts and nipples: Secondary | ICD-10-CM

## 2018-10-23 DIAGNOSIS — Z90710 Acquired absence of both cervix and uterus: Secondary | ICD-10-CM

## 2018-10-23 DIAGNOSIS — Z79899 Other long term (current) drug therapy: Secondary | ICD-10-CM

## 2018-10-23 DIAGNOSIS — R202 Paresthesia of skin: Secondary | ICD-10-CM

## 2018-10-23 DIAGNOSIS — F329 Major depressive disorder, single episode, unspecified: Secondary | ICD-10-CM

## 2018-10-23 DIAGNOSIS — Z800 Family history of malignant neoplasm of digestive organs: Secondary | ICD-10-CM

## 2018-10-23 DIAGNOSIS — R5383 Other fatigue: Secondary | ICD-10-CM

## 2018-10-23 DIAGNOSIS — K219 Gastro-esophageal reflux disease without esophagitis: Secondary | ICD-10-CM

## 2018-10-23 DIAGNOSIS — D649 Anemia, unspecified: Secondary | ICD-10-CM

## 2018-10-23 DIAGNOSIS — Z9012 Acquired absence of left breast and nipple: Secondary | ICD-10-CM

## 2018-10-23 LAB — CBC WITH DIFFERENTIAL/PLATELET
Abs Immature Granulocytes: 0.15 10*3/uL — ABNORMAL HIGH (ref 0.00–0.07)
Basophils Absolute: 0.1 10*3/uL (ref 0.0–0.1)
Basophils Relative: 2 %
Eosinophils Absolute: 0 10*3/uL (ref 0.0–0.5)
Eosinophils Relative: 0 %
HCT: 28.6 % — ABNORMAL LOW (ref 36.0–46.0)
Hemoglobin: 9.2 g/dL — ABNORMAL LOW (ref 12.0–15.0)
Immature Granulocytes: 3 %
Lymphocytes Relative: 28 %
Lymphs Abs: 1.3 10*3/uL (ref 0.7–4.0)
MCH: 30.1 pg (ref 26.0–34.0)
MCHC: 32.2 g/dL (ref 30.0–36.0)
MCV: 93.5 fL (ref 80.0–100.0)
Monocytes Absolute: 1.1 10*3/uL — ABNORMAL HIGH (ref 0.1–1.0)
Monocytes Relative: 23 %
Neutro Abs: 2.1 10*3/uL (ref 1.7–7.7)
Neutrophils Relative %: 44 %
Platelets: 118 10*3/uL — ABNORMAL LOW (ref 150–400)
RBC: 3.06 MIL/uL — ABNORMAL LOW (ref 3.87–5.11)
RDW: 15.7 % — ABNORMAL HIGH (ref 11.5–15.5)
WBC: 4.7 10*3/uL (ref 4.0–10.5)
nRBC: 0.6 % — ABNORMAL HIGH (ref 0.0–0.2)

## 2018-10-23 LAB — COMPREHENSIVE METABOLIC PANEL
ALT: 34 U/L (ref 0–44)
AST: 59 U/L — ABNORMAL HIGH (ref 15–41)
Albumin: 2.7 g/dL — ABNORMAL LOW (ref 3.5–5.0)
Alkaline Phosphatase: 142 U/L — ABNORMAL HIGH (ref 38–126)
Anion gap: 8 (ref 5–15)
BUN: 8 mg/dL (ref 6–20)
CO2: 26 mmol/L (ref 22–32)
Calcium: 8 mg/dL — ABNORMAL LOW (ref 8.9–10.3)
Chloride: 104 mmol/L (ref 98–111)
Creatinine, Ser: 0.6 mg/dL (ref 0.44–1.00)
GFR calc Af Amer: >60 mL/min (ref >60)
GFR calc non Af Amer: >60 mL/min (ref >60)
Glucose, Bld: 146 mg/dL — ABNORMAL HIGH (ref 70–99)
Potassium: 3.1 mmol/L — ABNORMAL LOW (ref 3.5–5.1)
Sodium: 138 mmol/L (ref 135–145)
Total Bilirubin: 1.7 mg/dL — ABNORMAL HIGH (ref 0.3–1.2)
Total Protein: 6.1 g/dL — ABNORMAL LOW (ref 6.5–8.1)

## 2018-10-23 LAB — MAGNESIUM: Magnesium: 1.8 mg/dL (ref 1.7–2.4)

## 2018-10-23 LAB — LACTATE DEHYDROGENASE: LDH: 419 U/L — ABNORMAL HIGH (ref 98–192)

## 2018-10-23 MED ORDER — SODIUM CHLORIDE 0.9 % IV SOLN
10.0000 mg | Freq: Once | INTRAVENOUS | Status: AC
  Administered 2018-10-23: 10 mg via INTRAVENOUS
  Filled 2018-10-23: qty 10

## 2018-10-23 MED ORDER — FAMOTIDINE IN NACL 20-0.9 MG/50ML-% IV SOLN
20.0000 mg | Freq: Once | INTRAVENOUS | Status: AC
  Administered 2018-10-23: 20 mg via INTRAVENOUS
  Filled 2018-10-23: qty 50

## 2018-10-23 MED ORDER — DIPHENHYDRAMINE HCL 50 MG/ML IJ SOLN
25.0000 mg | Freq: Once | INTRAMUSCULAR | Status: AC
  Administered 2018-10-23: 25 mg via INTRAVENOUS
  Filled 2018-10-23: qty 1

## 2018-10-23 MED ORDER — POTASSIUM CHLORIDE CRYS ER 20 MEQ PO TBCR
40.0000 meq | EXTENDED_RELEASE_TABLET | Freq: Once | ORAL | Status: AC
  Administered 2018-10-23: 11:00:00 40 meq via ORAL
  Filled 2018-10-23: qty 2

## 2018-10-23 MED ORDER — SODIUM CHLORIDE 0.9 % IV SOLN
60.0000 mg/m2 | Freq: Once | INTRAVENOUS | Status: AC
  Administered 2018-10-23: 120 mg via INTRAVENOUS
  Filled 2018-10-23: qty 20

## 2018-10-23 MED ORDER — HEPARIN SOD (PORK) LOCK FLUSH 100 UNIT/ML IV SOLN
250.0000 [IU] | Freq: Once | INTRAVENOUS | Status: AC | PRN
  Administered 2018-10-23: 250 [IU]

## 2018-10-23 MED ORDER — PALONOSETRON HCL INJECTION 0.25 MG/5ML
0.2500 mg | Freq: Once | INTRAVENOUS | Status: AC
  Administered 2018-10-23: 0.25 mg via INTRAVENOUS
  Filled 2018-10-23: qty 5

## 2018-10-23 MED ORDER — SODIUM CHLORIDE 0.9% FLUSH
3.0000 mL | INTRAVENOUS | Status: DC | PRN
  Administered 2018-10-23: 3 mL
  Filled 2018-10-23: qty 10

## 2018-10-23 MED ORDER — SODIUM CHLORIDE 0.9 % IV SOLN
Freq: Once | INTRAVENOUS | Status: AC
  Administered 2018-10-23: 10:00:00 via INTRAVENOUS

## 2018-10-23 NOTE — Patient Instructions (Signed)
[place] at [facility] Discharge Instructions  Labs drawn from PICC line today.    Thank you for choosing [facility] at [facility] to provide your oncology and hematology care.  To afford each patient quality time with our provider, please arrive at least 15 minutes before your scheduled appointment time.   If you have a lab appointment with the [facility] please come in thru the  Main Entrance and check in at the main information desk  You need to re-schedule your appointment should you arrive 10 or more minutes late.  We strive to give you quality time with our providers, and arriving late affects you and other patients whose appointments are after yours.  Also, if you no show three or more times for appointments you may be dismissed from the clinic at the providers discretion.     Again, thank you for choosing [facility].  Our hope is that these requests will decrease the amount of time that you wait before being seen by our physicians.       _____________________________________________________________  Should you have questions after your visit to [facility], please contact our office at [phone] between the hours of 8:00 a.m. and 4:30 p.m.  Voicemails left after 4:00 p.m. will not be returned until the following business day.  For prescription refill requests, have your pharmacy contact our office and allow 72 hours.    Cancer Center Support Programs:   > Cancer Support Group  2nd Tuesday of the month 1pm-2pm, Journey Room

## 2018-10-23 NOTE — Patient Instructions (Addendum)
[facility] at [facility] Discharge Instructions  You were seen today by Dr. Katragadda. He went over your recent lab results. He will see you back in 1 week for labs, treatment and follow up.   Thank you for choosing [facility] at [facility] to provide your oncology and hematology care.  To afford each patient quality time with our provider, please arrive at least 15 minutes before your scheduled appointment time.   If you have a lab appointment with the Cancer Center please come in thru the  Main Entrance and check in at the main information desk  You need to re-schedule your appointment should you arrive 10 or more minutes late.  We strive to give you quality time with our providers, and arriving late affects you and other patients whose appointments are after yours.  Also, if you no show three or more times for appointments you may be dismissed from the clinic at the providers discretion.     Again, thank you for choosing [facility].  Our hope is that these requests will decrease the amount of time that you wait before being seen by our physicians.       _____________________________________________________________  Should you have questions after your visit to [facility], please contact our office at [phone] between the hours of 8:00 a.m. and 4:30 p.m.  Voicemails left after 4:00 p.m. will not be returned until the following business day.  For prescription refill requests, have your pharmacy contact our office and allow 72 hours.    Cancer Center Support Programs:   > Cancer Support Group  2nd Tuesday of the month 1pm-2pm, Journey Room    

## 2018-10-23 NOTE — Patient Instructions (Signed)
[facility] Discharge Instructions for Patients Receiving Chemotherapy   Beginning January 23rd 2017 lab work for the Cancer Center will be done in the  Main lab at [place] on 1st floor. If you have a lab appointment with the Cancer Center please come in thru the  Main Entrance and check in at the main information desk   Today you received the following chemotherapy agents Taxol. Follow-up as scheduled. Call clinic for any questions or concerns  To help prevent nausea and vomiting after your treatment, we encourage you to take your nausea medication   If you develop nausea and vomiting, or diarrhea that is not controlled by your medication, call the clinic.  The clinic phone number is [phone]. Office hours are Monday-Friday 8:30am-5:00pm.  BELOW ARE SYMPTOMS THAT SHOULD BE REPORTED IMMEDIATELY:  *FEVER GREATER THAN 101.0 F  *CHILLS WITH OR WITHOUT FEVER  NAUSEA AND VOMITING THAT IS NOT CONTROLLED WITH YOUR NAUSEA MEDICATION  *UNUSUAL SHORTNESS OF BREATH  *UNUSUAL BRUISING OR BLEEDING  TENDERNESS IN MOUTH AND THROAT WITH OR WITHOUT PRESENCE OF ULCERS  *URINARY PROBLEMS  *BOWEL PROBLEMS  UNUSUAL RASH Items with * indicate a potential emergency and should be followed up as soon as possible. If you have an emergency after office hours please contact your primary care physician or go to the nearest emergency department.  Please call the clinic during office hours if you have any questions or concerns.   You may also contact the Patient Navigator at [phone] should you have any questions or need assistance in obtaining follow up care.      Resources For Cancer Patients and their Caregivers ? American Cancer Society: Can assist with transportation, wigs, general needs, runs Look Good Feel Better.        1-888-227-6333 ? Cancer Care: Provides financial assistance, online support groups, medication/co-pay assistance.  1-800-813-HOPE  (4673) ? [facility] Assists [place] cancer patients and their families through emotional , educational and financial support.  336-427-4357 ? [place] DSS Where to apply for food stamps, Medicaid and utility assistance. 336-342-1394 ? [facility]: Transportation to medical appointments. 336-347-2287 ? Social Security Administration: May apply for disability if have a Stage IV cancer. 336-342-7796 1-800-772-1213 ? [place] Aging, Disability and Transit Services: Assists with nutrition, care and transit needs. 336-349-2343         

## 2018-10-23 NOTE — Progress Notes (Signed)
1025 Labs reviewed with and pt seen by Dr. [name] and pt approved for Taxol infusion                Pamela Long tolerated Taxol infusion well without complaints or incident. PICC line dressing and cap changed and flushed per protocol. PICC site without any redness or drainage noted. VSS upon discharge. Pt given PO Potassium for K+ 3.1 today. Pt discharged via wheelchair in satisfactory condition

## 2018-10-23 NOTE — Progress Notes (Signed)
[place], [place] [address]   CLINIC:  Medical Oncology/Hematology  PCP:  Doree Albee, MD [place] [id number] [phone]   REASON FOR VISIT:  Follow-up for metastatic breast cancer.   BRIEF ONCOLOGIC HISTORY:    Metastatic breast cancer ([place])   07/02/2018 Initial Diagnosis    Metastatic breast cancer ([place])    09/18/2018 -  Chemotherapy    The patient had palonosetron (ALOXI) injection 0.25 mg, 0.25 mg, Intravenous,  Once, 2 of 4 cycles Administration: 0.25 mg (10/01/2018), 0.25 mg (10/09/2018), 0.25 mg (10/23/2018) PACLitaxel (TAXOL) 120 mg in sodium chloride 0.9 % 250 mL chemo infusion (</= 70m/m2), 60 mg/m2 = 120 mg (100 % of original dose 60 mg/m2), Intravenous,  Once, 2 of 4 cycles Dose modification: 60 mg/m2 (original dose 60 mg/m2, Cycle 1, Reason: Change in LFTs) Administration: 120 mg (09/18/2018), 120 mg (10/01/2018), 120 mg (10/23/2018), 120 mg (10/09/2018)  for chemotherapy treatment.       CANCER STAGING: Cancer Staging No matching staging information was found for the patient.   INTERVAL HISTORY:  Ms. NBraaksma46. female returns for routine follow-up and consideration for next cycle of chemotherapy. She is here today alone. She states that she continues to have constipation, but has bought medication to help with this. She states that the lactulose gags her. She states that she has experienced some anxiety/depression and would like to restart her Lexapro, she was advised on how to start this. Denies any vomiting, or diarrhea. Denies any new pains. Had not noticed any recent bleeding such as epistaxis, hematuria or hematochezia. Denies recent chest pain on exertion, shortness of breath on minimal exertion, pre-syncopal episodes, or palpitations. Denies any numbness or tingling in hands or feet. Denies any recent fevers, infections, or recent hospitalizations. Patient reports appetite at 25% and energy level at  25%.      REVIEW OF SYSTEMS:  Review of Systems  Gastrointestinal: Positive for constipation and nausea.     PAST MEDICAL/SURGICAL HISTORY:  Past Medical History:  Diagnosis Date  . Anemia   . Anxiety   . Asthma   . Breast cancer ([place]    Left, 2017  . Depression   . GERD (gastroesophageal reflux disease)   . Migraine   . OAB (overactive bladder)   . Seasonal allergies    Past Surgical History:  Procedure Laterality Date  . ABDOMINAL HYSTERECTOMY     breast cancer  . ANKLE RECONSTRUCTION Right 1989  . BILIARY STENT PLACEMENT N/A 09/22/2018   Procedure: BILIARY STENT PLACEMENT;  Surgeon: RRogene Houston MD;  [facility];  Service: Endoscopy;  Laterality: N/A;  . BREAST SURGERY    . COLONOSCOPY WITH PROPOFOL N/A 08/06/2018   Procedure: COLONOSCOPY WITH PROPOFOL;  Surgeon: FDanie Binder MD;  Location: [facility];  Service: Endoscopy;  Laterality: N/A;  1:30pm  . ERCP N/A 09/22/2018   Procedure: ENDOSCOPIC RETROGRADE CHOLANGIOPANCREATOGRAPHY (ERCP);  Surgeon: RRogene Houston MD;  Location: [facility];  Service: Endoscopy;  Laterality: N/A;  . FOREIGN BODY REMOVAL N/A 08/29/2017   from lip  . KNEE SURGERY Right 1989   bone spur  . MASTECTOMY  2017   bil mastectomies  . SINUSOTOMY       SOCIAL HISTORY:  Social History   Socioeconomic History  . Marital status: Single    Spouse name: Not on file  . Number of children: 1  . Years of  education: 16  . Highest education level: Not on file  Occupational History  . Occupation: disabled  Social Needs  . Financial resource strain: Somewhat hard  . Food insecurity:    Worry: Sometimes true    Inability: Sometimes true  . Transportation needs:    Medical: No    Non-medical: No  Tobacco Use  . Smoking status: Never Smoker  . Smokeless tobacco: Never Used  Substance and Sexual Activity  . Alcohol use: No  . Drug use: No  . Sexual activity: Not Currently  Lifestyle  . Physical activity:     Days per week: 0 days    Minutes per session: 0 min  . Stress: Rather much  Relationships  . Social connections:    Talks on phone: Once a week    Gets together: Once a week    Attends religious service: 1 to 4 times per year    Active member of club or organization: Yes    Attends meetings of clubs or organizations: Never    Relationship status: Never married  . Intimate partner violence:    Fear of current or ex partner: No    Emotionally abused: No    Physically abused: No    Forced sexual activity: No  Other Topics Concern  . Not on file  Social History Narrative   Bachelors degree   Accounting   Lives with daughter [name] who has autism and diabetes   Likes to sew, quilt, crafts, crochet    FAMILY HISTORY:  Family History  Problem Relation Age of Onset  . Arthritis Mother   . COPD Mother   . Depression Mother   . Diabetes Mother   . Kidney disease Father   . Heart disease Father 79  . Drug abuse Father   . Hypertension Father   . Diabetes Daughter   . Hashimoto's thyroiditis Daughter   . Irritable bowel syndrome Daughter   . Autism spectrum disorder Daughter   . Early death Maternal Grandmother        drowned  . Early death Maternal Grandfather   . Heart disease Maternal Grandfather   . Heart disease Paternal Grandfather   . Breast cancer Maternal Aunt   . Breast cancer Cousin   . Lung cancer Maternal Aunt   . Lung cancer Maternal Uncle   . Colon cancer Neg Hx     CURRENT MEDICATIONS:  Outpatient Encounter Medications as of 10/23/2018  Medication Sig Note  . antiseptic oral rinse (BIOTENE) LIQD 15 mLs by Mouth Rinse route as needed for dry mouth.   . Cholecalciferol (VITAMIN D3 GUMMIES) 25 MCG (1000 UT) CHEW Chew 2,000-4,000 Units by mouth daily.   Marland Kitchen docusate sodium (COLACE) 100 MG capsule Take 1 capsule (100 mg total) by mouth 2 (two) times daily as needed for mild constipation.   Marland Kitchen escitalopram (LEXAPRO) 20 MG tablet Take 1 tablet (20 mg total) by mouth  daily. (Patient not taking: Reported on 10/16/2018)   . HYDROmorphone (DILAUDID) 2 MG tablet Take 1-2 tablets (2-4 mg total) by mouth every 6 (six) hours as needed for severe pain. (Patient not taking: Reported on 10/16/2018)   . lidocaine (LIDODERM) 5 % Place 1 patch onto the skin daily. Remove & Discard patch within 12 hours or as directed by MD   . magic mouthwash w/lidocaine SOLN Take 5 mLs by mouth 4 (four) times daily as needed for mouth pain.   . magnesium oxide (MAG-OX) 400 (241.3 Mg) MG tablet Take 1  tablet (400 mg total) by mouth daily.   . naproxen sodium (ALEVE) 220 MG tablet Take 220-440 mg by mouth 2 (two) times daily as needed (for pain or headache).    . ondansetron (ZOFRAN ODT) 8 MG disintegrating tablet Take 1 tablet (8 mg total) by mouth every 8 (eight) hours as needed for nausea or vomiting.   Marland Kitchen PACLitaxel (TAXOL IV) Inject into the vein once a week.    . pantoprazole (PROTONIX) 40 MG tablet TAKE 1 TABLET BY MOUTH DAILY BEFORE BREAKFAST (Patient taking differently: 40 mg daily. )   . Polyvinyl Alcohol-Povidone PF (REFRESH) 1.4-0.6 % SOLN Place 1 drop into both eyes as needed (for dry eyes).    . prochlorperazine (COMPAZINE) 10 MG tablet Take 1 tablet (10 mg total) by mouth every 6 (six) hours as needed for nausea or vomiting.   . promethazine (PHENERGAN) 25 MG tablet Take 1 tablet (25 mg total) by mouth every 6 (six) hours as needed for nausea or vomiting.   . rizatriptan (MAXALT-MLT) 10 MG disintegrating tablet Take 10 mg by mouth every 2 (two) hours as needed for migraine.    . traMADol (ULTRAM) 50 MG tablet Take 1 tablet (50 mg total) by mouth 2 (two) times daily.   Marland Kitchen zolpidem (AMBIEN) 10 MG tablet Take 1 tablet (10 mg total) by mouth at bedtime as needed for up to 30 days for sleep.   . [DISCONTINUED] benzonatate (TESSALON) 100 MG capsule    . [DISCONTINUED] capecitabine (XELODA) 500 MG tablet Take 3 tablets (1,500 mg total) by mouth 2 (two) times daily after a meal. Take for 14  days, then hold for 7 days. Repeat every 21 days. (Patient not taking: Reported on 10/23/2018)   . [DISCONTINUED] fulvestrant (FASLODEX) 250 MG/5ML injection Inject 250 mg into the muscle every 30 (thirty) days. One injection each buttock over 1-2 minutes. Warm prior to use. 09/09/2018: Received on 08/28/2018  . [DISCONTINUED] gabapentin (NEURONTIN) 300 MG capsule Take 600 mg by mouth at bedtime.    . [DISCONTINUED] lactulose (CHRONULAC) 10 GM/15ML solution Take 30 mLs (20 g total) by mouth 2 (two) times daily as needed for mild constipation. (Patient not taking: Reported on 10/16/2018) 09/21/2018: Pt tries to avoid - it makes her feel sick   . [DISCONTINUED] rizatriptan (MAXALT) 10 MG tablet TAKE 1 TABLET AT ONSET AFTER 2 HOURS NO RELIEF TAKE 1 TABLET. DO NOT EXCEED OVER 30MG/24HR.   . [DISCONTINUED] TROKENDI XR 50 MG CP24 Take 50 mg by mouth 2 (two) times daily.    No facility-administered encounter medications on file as of 10/23/2018.     ALLERGIES:  Allergies  Allergen Reactions  . Corn-Containing Products Other (See Comments)    Headache and GI upset  . Salagen [Pilocarpine] Other (See Comments)    Can cause liver failure   . Tape Itching and Other (See Comments)    Depending on the adhesive-blistering occurs  . Amoxicillin Hives and Other (See Comments)    Rash only DID THE REACTION INVOLVE: Swelling of the face/tongue/throat, SOB, or low BP? Sudden or severe rash/hives, skin peeling, or the inside of the mouth or nose?  Did it require medical treatment?  When did it last happen? If all above answers are "NO", may proceed with cephalosporin use.   . Caffeine Diarrhea, Nausea Only, Palpitations and Other (See Comments)    Headache  . Tetanus Toxoids Swelling and Other (See Comments)    Local reaction     PHYSICAL EXAM:  ECOG  Performance status: 2  Vitals:   10/23/18 0843  BP: (!) 104/46  Pulse: 98  Resp: 18  Temp: 98.3 F (36.8 C)  SpO2: 99%   Filed Weights    10/23/18 0843  Weight: 175 lb 9.6 oz (79.7 kg)    Physical Exam Vitals signs reviewed.  Constitutional:      Appearance: Normal appearance.  Cardiovascular:     Rate and Rhythm: Normal rate and regular rhythm.     Heart sounds: Normal heart sounds.  Pulmonary:     Effort: Pulmonary effort is normal.     Breath sounds: Normal breath sounds.  Abdominal:     General: There is no distension.     Palpations: Abdomen is soft. There is no mass.  Musculoskeletal:        General: No swelling.  Skin:    General: Skin is warm.  Neurological:     General: No focal deficit present.     Mental Status: She is alert and oriented to person, place, and time.  Psychiatric:        Mood and Affect: Mood normal.        Behavior: Behavior normal.      LABORATORY DATA:  I have reviewed the labs as listed.  CBC    Component Value Date/Time   WBC 4.7 10/23/2018 0831   RBC 3.06 (L) 10/23/2018 0831   HGB 9.2 (L) 10/23/2018 0831   HCT 28.6 (L) 10/23/2018 0831   PLT 118 (L) 10/23/2018 0831   MCV 93.5 10/23/2018 0831   MCH 30.1 10/23/2018 0831   MCHC 32.2 10/23/2018 0831   RDW 15.7 (H) 10/23/2018 0831   LYMPHSABS 1.3 10/23/2018 0831   MONOABS 1.1 (H) 10/23/2018 0831   EOSABS 0.0 10/23/2018 0831   BASOSABS 0.1 10/23/2018 0831   CMP Latest Ref Rng & Units 10/23/2018 10/19/2018 10/16/2018  Glucose 70 - 99 mg/dL 146(H) 171(H) 139(H)  BUN 6 - 20 mg/dL '8 9 10  ' Creatinine 0.44 - 1.00 mg/dL 0.60 0.65 0.60  Sodium 135 - 145 mmol/L 138 139 134(L)  Potassium 3.5 - 5.1 mmol/L 3.1(L) 3.1(L) 3.1(L)  Chloride 98 - 111 mmol/L 104 103 100  CO2 22 - 32 mmol/L '26 26 26  ' Calcium 8.9 - 10.3 mg/dL 8.0(L) 9.1 9.3  Total Protein 6.5 - 8.1 g/dL 6.1(L) 6.6 6.8  Total Bilirubin 0.3 - 1.2 mg/dL 1.7(H) 2.0(H) 2.7(H)  Alkaline Phos 38 - 126 U/L 142(H) 163(H) 172(H)  AST 15 - 41 U/L 59(H) 82(H) 108(H)  ALT 0 - 44 U/L 34 46(H) 53(H)       DIAGNOSTIC IMAGING:  I have independently reviewed the scans and discussed  with the patient.   I have reviewed Venita Lick LPN's note and agree with the documentation.  I personally performed a face-to-face visit, made revisions and my assessment and plan is as follows.    ASSESSMENT & PLAN:   Malignant neoplasm of central portion of left breast in female, estrogen receptor positive ([place]) 1.  Metastatic breast cancer to the liver: - Left breast cancer diagnosed in January 2017, treated with chemotherapy with dose dense AC followed by Taxol, lumpectomy. - CT CAP on 06/17/2018 shows very large hepatic mass measuring 22 cm, mildly enlarged left internal mammary node, borderline enlarged left lower leg node. -Biopsy of the liver mass on 06/26/2018 shows breast cancer, ER/PR positive and HER-2 negative. -PET CT scan on 07/13/2018 shows very large centrally necrotic mass involving the right and left hepatic lobes, and other  hypermetabolic lesion in the left lobe.  Multiple hypermetabolic lymph nodes in the upper abdomen, left supraclavicular space.  MRI of the T-spine on 08/31/2018 shows possible small vertebral body metastasis at T5 and T8. - Abemaciclib and Faslodex from 07/02/2018 through 09/07/2018 discontinued for progression. - ERCP and plastic stent in the hepatic duct on 09/22/2018 by Dr.Rehman - Paclitaxel 60 mg/m2 3 weeks on 1 week off started on 09/18/2018. -I have added Xeloda to the regimen.  She could tolerate only for 1 week. - She does not report any abdominal pain at this time.  Her bilirubin today improved to 1.7. - I have reviewed her blood work.  She may proceed with cycle 2 of chemotherapy today.  We will reevaluate her in 1 week. -I plan to increase her paclitaxel to 80 mg per metered squared once her bilirubin normalizes.  2.  Constipation: -She could not tolerate lactulose as it gagged her. -She is taking Colace which is controlling it.  3.  Nutrition: -I had a prolonged discussion with the patient about maintaining her nutrition status during  chemotherapy.  She was told to drink Ensure/boost.      Orders placed this encounter:  No orders of the defined types were placed in this encounter.     Derek Jack, MD [place] [phone]

## 2018-10-23 NOTE — Assessment & Plan Note (Addendum)
1.  Metastatic breast cancer to the liver: - Left breast cancer diagnosed in January 2017, treated with chemotherapy with dose dense AC followed by Taxol, lumpectomy. - CT CAP on 06/17/2018 shows very large hepatic mass measuring 22 cm, mildly enlarged left internal mammary node, borderline enlarged left lower leg node. -Biopsy of the liver mass on 06/26/2018 shows breast cancer, ER/PR positive and HER-2 negative. -PET CT scan on 07/13/2018 shows very large centrally necrotic mass involving the right and left hepatic lobes, and other hypermetabolic lesion in the left lobe.  Multiple hypermetabolic lymph nodes in the upper abdomen, left supraclavicular space.  MRI of the T-spine on 08/31/2018 shows possible small vertebral body metastasis at T5 and T8. - Abemaciclib and Faslodex from 07/02/2018 through 09/07/2018 discontinued for progression. - ERCP and plastic stent in the hepatic duct on 09/22/2018 by Dr.Rehman - Paclitaxel 60 mg/m2 3 weeks on 1 week off started on 09/18/2018. -I have added Xeloda to the regimen.  She could tolerate only for 1 week. - She does not report any abdominal pain at this time.  Her bilirubin today improved to 1.7. - I have reviewed her blood work.  She may proceed with cycle 2 of chemotherapy today.  We will reevaluate her in 1 week. -I plan to increase her paclitaxel to 80 mg per metered squared once her bilirubin normalizes.  2.  Constipation: -She could not tolerate lactulose as it gagged her. -She is taking Colace which is controlling it.  3.  Nutrition: -I had a prolonged discussion with the patient about maintaining her nutrition status during chemotherapy.  She was told to drink Ensure/boost.

## 2018-10-23 NOTE — Progress Notes (Signed)
Potassium chloride 40 mEq PO ordered for potassium level of 3.1 today per MD V.O.   Demetrius Charity, PharmD, [facility] Phone: [phone] 10/23/2018

## 2018-10-30 ENCOUNTER — Other Ambulatory Visit: Payer: Self-pay

## 2018-10-30 ENCOUNTER — Inpatient Hospital Stay (HOSPITAL_BASED_OUTPATIENT_CLINIC_OR_DEPARTMENT_OTHER): Payer: 59 | Admitting: Hematology

## 2018-10-30 ENCOUNTER — Encounter (HOSPITAL_COMMUNITY): Payer: Self-pay | Admitting: Hematology

## 2018-10-30 ENCOUNTER — Inpatient Hospital Stay (HOSPITAL_COMMUNITY): Payer: 59

## 2018-10-30 VITALS — BP 115/73 | HR 85 | Temp 98.8°F | Resp 18

## 2018-10-30 DIAGNOSIS — J45909 Unspecified asthma, uncomplicated: Secondary | ICD-10-CM

## 2018-10-30 DIAGNOSIS — K5900 Constipation, unspecified: Secondary | ICD-10-CM

## 2018-10-30 DIAGNOSIS — R202 Paresthesia of skin: Secondary | ICD-10-CM

## 2018-10-30 DIAGNOSIS — Z800 Family history of malignant neoplasm of digestive organs: Secondary | ICD-10-CM

## 2018-10-30 DIAGNOSIS — Z9012 Acquired absence of left breast and nipple: Secondary | ICD-10-CM

## 2018-10-30 DIAGNOSIS — Z79899 Other long term (current) drug therapy: Secondary | ICD-10-CM

## 2018-10-30 DIAGNOSIS — Z90710 Acquired absence of both cervix and uterus: Secondary | ICD-10-CM

## 2018-10-30 DIAGNOSIS — R200 Anesthesia of skin: Secondary | ICD-10-CM

## 2018-10-30 DIAGNOSIS — F419 Anxiety disorder, unspecified: Secondary | ICD-10-CM

## 2018-10-30 DIAGNOSIS — Z803 Family history of malignant neoplasm of breast: Secondary | ICD-10-CM

## 2018-10-30 DIAGNOSIS — Z90722 Acquired absence of ovaries, bilateral: Secondary | ICD-10-CM

## 2018-10-30 DIAGNOSIS — Z5111 Encounter for antineoplastic chemotherapy: Secondary | ICD-10-CM | POA: Diagnosis not present

## 2018-10-30 DIAGNOSIS — Z170 Estrogen receptor positive status [ER+]: Secondary | ICD-10-CM | POA: Diagnosis not present

## 2018-10-30 DIAGNOSIS — D649 Anemia, unspecified: Secondary | ICD-10-CM

## 2018-10-30 DIAGNOSIS — C50112 Malignant neoplasm of central portion of left female breast: Secondary | ICD-10-CM

## 2018-10-30 DIAGNOSIS — C50919 Malignant neoplasm of unspecified site of unspecified female breast: Secondary | ICD-10-CM

## 2018-10-30 DIAGNOSIS — Z9013 Acquired absence of bilateral breasts and nipples: Secondary | ICD-10-CM

## 2018-10-30 DIAGNOSIS — R5383 Other fatigue: Secondary | ICD-10-CM

## 2018-10-30 DIAGNOSIS — F329 Major depressive disorder, single episode, unspecified: Secondary | ICD-10-CM

## 2018-10-30 DIAGNOSIS — R944 Abnormal results of kidney function studies: Secondary | ICD-10-CM

## 2018-10-30 DIAGNOSIS — C787 Secondary malignant neoplasm of liver and intrahepatic bile duct: Secondary | ICD-10-CM | POA: Diagnosis not present

## 2018-10-30 DIAGNOSIS — Z801 Family history of malignant neoplasm of trachea, bronchus and lung: Secondary | ICD-10-CM

## 2018-10-30 DIAGNOSIS — K219 Gastro-esophageal reflux disease without esophagitis: Secondary | ICD-10-CM

## 2018-10-30 DIAGNOSIS — N3281 Overactive bladder: Secondary | ICD-10-CM

## 2018-10-30 LAB — COMPREHENSIVE METABOLIC PANEL
ALT: 31 U/L (ref 0–44)
AST: 46 U/L — ABNORMAL HIGH (ref 15–41)
Albumin: 2.8 g/dL — ABNORMAL LOW (ref 3.5–5.0)
Alkaline Phosphatase: 145 U/L — ABNORMAL HIGH (ref 38–126)
Anion gap: 9 (ref 5–15)
BUN: 10 mg/dL (ref 6–20)
CO2: 24 mmol/L (ref 22–32)
Calcium: 8.4 mg/dL — ABNORMAL LOW (ref 8.9–10.3)
Chloride: 104 mmol/L (ref 98–111)
Creatinine, Ser: 0.5 mg/dL (ref 0.44–1.00)
GFR calc Af Amer: >60 mL/min (ref >60)
GFR calc non Af Amer: >60 mL/min (ref >60)
Glucose, Bld: 134 mg/dL — ABNORMAL HIGH (ref 70–99)
Potassium: 3.5 mmol/L (ref 3.5–5.1)
Sodium: 137 mmol/L (ref 135–145)
Total Bilirubin: 1.2 mg/dL (ref 0.3–1.2)
Total Protein: 6.2 g/dL — ABNORMAL LOW (ref 6.5–8.1)

## 2018-10-30 LAB — CBC WITH DIFFERENTIAL/PLATELET
Basophils Absolute: 0.1 10*3/uL (ref 0.0–0.1)
Basophils Relative: 2 %
Eosinophils Absolute: 0 10*3/uL (ref 0.0–0.5)
Eosinophils Relative: 1 %
HCT: 25.6 % — ABNORMAL LOW (ref 36.0–46.0)
Hemoglobin: 8.6 g/dL — ABNORMAL LOW (ref 12.0–15.0)
Lymphocytes Relative: 32 %
Lymphs Abs: 0.8 10*3/uL (ref 0.7–4.0)
MCH: 30.6 pg (ref 26.0–34.0)
MCHC: 33.6 g/dL (ref 30.0–36.0)
MCV: 91.1 fL (ref 80.0–100.0)
Monocytes Absolute: 0.1 10*3/uL (ref 0.1–1.0)
Monocytes Relative: 5 %
Neutro Abs: 1.4 10*3/uL — ABNORMAL LOW (ref 1.7–7.7)
Neutrophils Relative %: 59 %
Platelets: 109 10*3/uL — ABNORMAL LOW (ref 150–400)
RBC: 2.81 MIL/uL — ABNORMAL LOW (ref 3.87–5.11)
RDW: 14.6 % (ref 11.5–15.5)
WBC: 2.5 10*3/uL — ABNORMAL LOW (ref 4.0–10.5)
nRBC: 0 % (ref 0.0–0.2)

## 2018-10-30 MED ORDER — DIPHENHYDRAMINE HCL 50 MG/ML IJ SOLN
25.0000 mg | Freq: Once | INTRAMUSCULAR | Status: AC
  Administered 2018-10-30: 25 mg via INTRAVENOUS
  Filled 2018-10-30: qty 1

## 2018-10-30 MED ORDER — SODIUM CHLORIDE 0.9% FLUSH
10.0000 mL | INTRAVENOUS | Status: DC | PRN
  Administered 2018-10-30: 3 mL
  Administered 2018-10-30: 10 mL
  Filled 2018-10-30 (×2): qty 10

## 2018-10-30 MED ORDER — PALONOSETRON HCL INJECTION 0.25 MG/5ML
0.2500 mg | Freq: Once | INTRAVENOUS | Status: AC
  Administered 2018-10-30: 0.25 mg via INTRAVENOUS
  Filled 2018-10-30: qty 5

## 2018-10-30 MED ORDER — SODIUM CHLORIDE 0.9 % IV SOLN
Freq: Once | INTRAVENOUS | Status: AC
  Administered 2018-10-30: 10:00:00 via INTRAVENOUS

## 2018-10-30 MED ORDER — HEPARIN SOD (PORK) LOCK FLUSH 100 UNIT/ML IV SOLN
500.0000 [IU] | Freq: Once | INTRAVENOUS | Status: AC | PRN
  Administered 2018-10-30: 13:00:00 250 [IU]

## 2018-10-30 MED ORDER — SODIUM CHLORIDE 0.9 % IV SOLN
10.0000 mg | Freq: Once | INTRAVENOUS | Status: AC
  Administered 2018-10-30: 10:00:00 10 mg via INTRAVENOUS
  Filled 2018-10-30: qty 10

## 2018-10-30 MED ORDER — FAMOTIDINE IN NACL 20-0.9 MG/50ML-% IV SOLN
20.0000 mg | Freq: Once | INTRAVENOUS | Status: AC
  Administered 2018-10-30: 20 mg via INTRAVENOUS
  Filled 2018-10-30: qty 50

## 2018-10-30 MED ORDER — SODIUM CHLORIDE 0.9 % IV SOLN
60.0000 mg/m2 | Freq: Once | INTRAVENOUS | Status: AC
  Administered 2018-10-30: 120 mg via INTRAVENOUS
  Filled 2018-10-30: qty 20

## 2018-10-30 NOTE — Assessment & Plan Note (Addendum)
1.  Metastatic breast cancer to the liver: - Left breast cancer diagnosed in January 2017, treated with chemotherapy with dose dense AC followed by Taxol, lumpectomy. - CT CAP on 06/17/2018 shows very large hepatic mass measuring 22 cm, mildly enlarged left internal mammary node, borderline enlarged left lower leg node. -Biopsy of the liver mass on 06/26/2018 shows breast cancer, ER/PR positive and HER-2 negative. -PET CT scan on 07/13/2018 shows very large centrally necrotic mass involving the right and left hepatic lobes, and other hypermetabolic lesion in the left lobe.  Multiple hypermetabolic lymph nodes in the upper abdomen, left supraclavicular space.  MRI of the T-spine on 08/31/2018 shows possible small vertebral body metastasis at T5 and T8. - Abemaciclib and Faslodex from 07/02/2018 through 09/07/2018 discontinued for progression. - ERCP and plastic stent in the hepatic duct on 09/22/2018 by Dr.Rehman - Paclitaxel 60 mg/m2 3 weeks on 1 week off started on 09/18/2018. -We have added Xeloda to the regimen.  Unfortunately she could not tolerate after first week. -We have reviewed her blood work today.  ANC is 1475.  Total bilirubin improved to 1.2.  We will proceed with paclitaxel at the same dose of 60 mg per metered square. -We will reevaluate her in 1 week.  If ANC is above 1500, I will increase her to full dose paclitaxel.  2.  Constipation: -She could not tolerate lactulose as she could not swallow it. -She is taking Colace 2 tablets twice daily.  I have suggested her to take MiraLAX as needed.  3.  Nutrition: -She is eating 3 meals a day and is snacking.  She cannot tolerate Ensure.  She will try [employer].

## 2018-10-30 NOTE — Progress Notes (Signed)
[place], [place] [id number]   CLINIC:  Medical Oncology/Hematology  PCP:  Doree Albee, MD [place] [address] [phone]   REASON FOR VISIT:  Follow-up for metastatic breast cancer.   BRIEF ONCOLOGIC HISTORY:    Metastatic breast cancer ([place])   07/02/2018 Initial Diagnosis    Metastatic breast cancer ([place])    09/18/2018 -  Chemotherapy    The patient had palonosetron (ALOXI) injection 0.25 mg, 0.25 mg, Intravenous,  Once, 2 of 4 cycles Administration: 0.25 mg (10/01/2018), 0.25 mg (10/09/2018), 0.25 mg (10/23/2018) PACLitaxel (TAXOL) 120 mg in sodium chloride 0.9 % 250 mL chemo infusion (</= 76m/m2), 60 mg/m2 = 120 mg (100 % of original dose 60 mg/m2), Intravenous,  Once, 2 of 4 cycles Dose modification: 60 mg/m2 (original dose 60 mg/m2, Cycle 1, Reason: Change in LFTs) Administration: 120 mg (09/18/2018), 120 mg (10/01/2018), 120 mg (10/23/2018), 120 mg (10/09/2018)  for chemotherapy treatment.       CANCER STAGING: Cancer Staging No matching staging information was found for the patient.   INTERVAL HISTORY:  Ms. NWarnke89y.[id number]. female returns for next cycle of paclitaxel.  She does report some constipation.  She is taking Colace 2 tablets twice daily.  Denies any nausea or vomiting.  Mostly sitting in a chair and able to do household activities but has to rest.  Appetite has been improving.  Denies any diarrhea.  Denies any tingling or numbness extremities.  Denies any fevers or infections.  Appetite is 75% and energy levels are 25%.      REVIEW OF SYSTEMS:  Review of Systems  Gastrointestinal: Positive for constipation. Negative for nausea.     PAST MEDICAL/SURGICAL HISTORY:  Past Medical History:  Diagnosis Date  . Anemia   . Anxiety   . Asthma   . [place]    [place], [place]  . Depression   . GERD (gastroesophageal reflux disease)   . Migraine   . OAB (overactive bladder)   . Seasonal  allergies    Past Surgical History:  Procedure Laterality Date  . ABDOMINAL HYSTERECTOMY     breast cancer  . ANKLE RECONSTRUCTION Right 1989  . BILIARY STENT PLACEMENT N/A 09/22/2018   Procedure: BILIARY STENT PLACEMENT;  Surgeon: RRogene Houston MD;  [facility];  Service: Endoscopy;  Laterality: N/A;  . BREAST SURGERY    . COLONOSCOPY WITH PROPOFOL N/A 08/06/2018   Procedure: COLONOSCOPY WITH PROPOFOL;  Surgeon: FDanie Binder MD;  Location: [facility];  Service: Endoscopy;  Laterality: N/A;  1:30pm  . ERCP N/A 09/22/2018   Procedure: ENDOSCOPIC RETROGRADE CHOLANGIOPANCREATOGRAPHY (ERCP);  Surgeon: RRogene Houston MD;  Location: [facility];  Service: Endoscopy;  Laterality: N/A;  . FOREIGN BODY REMOVAL N/A 08/29/2017   from lip  . KNEE SURGERY Right 1989   bone spur  . MASTECTOMY  2017   bil mastectomies  . SINUSOTOMY       SOCIAL HISTORY:  Social History   Socioeconomic History  . Marital status: Single    Spouse name: Not on file  . Number of children: 1  . Years of education: 132 . Highest education level: Not on file  Occupational History  . Occupation: disabled  Social Needs  . Financial resource strain: Somewhat hard  . Food insecurity:    Worry: Sometimes true    Inability: Sometimes true  . Transportation needs:    Medical:  No    Non-medical: No  Tobacco Use  . Smoking status: Never Smoker  . Smokeless tobacco: Never Used  Substance and Sexual Activity  . Alcohol use: No  . Drug use: No  . Sexual activity: Not Currently  Lifestyle  . Physical activity:    Days per week: 0 days    Minutes per session: 0 min  . Stress: Rather much  Relationships  . Social connections:    Talks on phone: Once a week    Gets together: Once a week    Attends religious service: 1 to 4 times per year    Active member of club or organization: Yes    Attends meetings of clubs or organizations: Never    Relationship status: Never married  . Intimate  partner violence:    Fear of current or ex partner: No    Emotionally abused: No    Physically abused: No    Forced sexual activity: No  Other Topics Concern  . Not on file  Social History Narrative   Bachelors degree   Accounting   Lives with daughter [name] who has autism and diabetes   Likes to sew, quilt, crafts, crochet    FAMILY HISTORY:  Family History  Problem Relation Age of Onset  . Arthritis Mother   . COPD Mother   . Depression Mother   . Diabetes Mother   . Kidney disease Father   . Heart disease Father 71  . Drug abuse Father   . Hypertension Father   . Diabetes Daughter   . Hashimoto's thyroiditis Daughter   . Irritable bowel syndrome Daughter   . Autism spectrum disorder Daughter   . Early death Maternal Grandmother        drowned  . Early death Maternal Grandfather   . Heart disease Maternal Grandfather   . Heart disease Paternal Grandfather   . Breast cancer Maternal Aunt   . Breast cancer Cousin   . Lung cancer Maternal Aunt   . Lung cancer Maternal Uncle   . Colon cancer Neg Hx     CURRENT MEDICATIONS:  Outpatient Encounter Medications as of 10/30/2018  Medication Sig  . antiseptic oral rinse (BIOTENE) LIQD 15 mLs by Mouth Rinse route as needed for dry mouth.  . Cholecalciferol (VITAMIN D3 GUMMIES) 25 MCG (1000 UT) CHEW Chew 2,000-4,000 Units by mouth daily.  Marland Kitchen docusate sodium (COLACE) 100 MG capsule Take 1 capsule (100 mg total) by mouth 2 (two) times daily as needed for mild constipation.  Marland Kitchen escitalopram (LEXAPRO) 20 MG tablet Take 1 tablet (20 mg total) by mouth daily.  Marland Kitchen HYDROmorphone (DILAUDID) 2 MG tablet Take 1-2 tablets (2-4 mg total) by mouth every 6 (six) hours as needed for severe pain. (Patient not taking: Reported on 10/16/2018)  . lidocaine (LIDODERM) 5 % Place 1 patch onto the skin daily. Remove & Discard patch within 12 hours or as directed by MD  . magic mouthwash w/lidocaine SOLN Take 5 mLs by mouth 4 (four) times daily as needed  for mouth pain.  . magnesium oxide (MAG-OX) 400 (241.3 Mg) MG tablet Take 1 tablet (400 mg total) by mouth daily.  . naproxen sodium (ALEVE) 220 MG tablet Take 220-440 mg by mouth 2 (two) times daily as needed (for pain or headache).   . ondansetron (ZOFRAN ODT) 8 MG disintegrating tablet Take 1 tablet (8 mg total) by mouth every 8 (eight) hours as needed for nausea or vomiting.  Marland Kitchen PACLitaxel (TAXOL IV) Inject into  the vein once a week.   . pantoprazole (PROTONIX) 40 MG tablet TAKE 1 TABLET BY MOUTH DAILY BEFORE BREAKFAST (Patient taking differently: 40 mg daily. )  . Polyvinyl Alcohol-Povidone PF (REFRESH) 1.4-0.6 % SOLN Place 1 drop into both eyes as needed (for dry eyes).   . prochlorperazine (COMPAZINE) 10 MG tablet Take 1 tablet (10 mg total) by mouth every 6 (six) hours as needed for nausea or vomiting.  . promethazine (PHENERGAN) 25 MG tablet Take 1 tablet (25 mg total) by mouth every 6 (six) hours as needed for nausea or vomiting.  . rizatriptan (MAXALT-MLT) 10 MG disintegrating tablet Take 10 mg by mouth every 2 (two) hours as needed for migraine.   . traMADol (ULTRAM) 50 MG tablet Take 1 tablet (50 mg total) by mouth 2 (two) times daily.  Marland Kitchen zolpidem (AMBIEN) 10 MG tablet Take 1 tablet (10 mg total) by mouth at bedtime as needed for up to 30 days for sleep.   No facility-administered encounter medications on file as of 10/30/2018.     ALLERGIES:  Allergies  Allergen Reactions  . Corn-Containing Products Other (See Comments)    Headache and GI upset  . Salagen [Pilocarpine] Other (See Comments)    Can cause liver failure   . Tape Itching and Other (See Comments)    Depending on the adhesive-blistering occurs  . Amoxicillin Hives and Other (See Comments)    Rash only DID THE REACTION INVOLVE: Swelling of the face/tongue/throat, SOB, or low BP? Sudden or severe rash/hives, skin peeling, or the inside of the mouth or nose?  Did it require medical treatment?  When did it last  happen? If all above answers are "NO", may proceed with cephalosporin use.   . Caffeine Diarrhea, Nausea Only, Palpitations and Other (See Comments)    Headache  . Tetanus Toxoids Swelling and Other (See Comments)    Local reaction     PHYSICAL EXAM:  ECOG Performance status: 2  Vitals:   10/30/18 0750  BP: 127/76  Pulse: 94  Resp: 18  Temp: 98.6 F (37 C)  SpO2: 99%   Filed Weights   10/30/18 0750  Weight: 175 lb 6.4 oz (79.6 kg)    Physical Exam Vitals signs reviewed.  Constitutional:      Appearance: Normal appearance.  Cardiovascular:     Rate and Rhythm: Normal rate and regular rhythm.     Heart sounds: Normal heart sounds.  Pulmonary:     Effort: Pulmonary effort is normal.     Breath sounds: Normal breath sounds.  Abdominal:     General: There is no distension.     Palpations: Abdomen is soft. There is no mass.  Musculoskeletal:        General: No swelling.  Skin:    General: Skin is warm.  Neurological:     General: No focal deficit present.     Mental Status: She is alert and oriented to person, place, and time.  Psychiatric:        Mood and Affect: Mood normal.        Behavior: Behavior normal.      LABORATORY DATA:  I have reviewed the labs as listed.  CBC    Component Value Date/Time   WBC 2.5 (L) 10/30/2018 0748   RBC 2.81 (L) 10/30/2018 0748   HGB 8.6 (L) 10/30/2018 0748   HCT 25.6 (L) 10/30/2018 0748   PLT 109 (L) 10/30/2018 0748   MCV 91.1 10/30/2018 0748   MCH 30.6  10/30/2018 0748   MCHC 33.6 10/30/2018 0748   RDW 14.6 10/30/2018 0748   LYMPHSABS 0.8 10/30/2018 0748   MONOABS 0.1 10/30/2018 0748   EOSABS 0.0 10/30/2018 0748   BASOSABS 0.1 10/30/2018 0748   CMP Latest Ref Rng & Units 10/30/2018 10/23/2018 10/19/2018  Glucose 70 - 99 mg/dL 134(H) 146(H) 171(H)  BUN 6 - 20 mg/dL '10 8 9  ' Creatinine 0.44 - 1.00 mg/dL 0.50 0.60 0.65  Sodium 135 - 145 mmol/L 137 138 139  Potassium 3.5 - 5.1 mmol/L 3.5 3.1(L) 3.1(L)  Chloride  98 - 111 mmol/L 104 104 103  CO2 22 - 32 mmol/L '24 26 26  ' Calcium 8.9 - 10.3 mg/dL 8.4(L) 8.0(L) 9.1  Total Protein 6.5 - 8.1 g/dL 6.2(L) 6.1(L) 6.6  Total Bilirubin 0.3 - 1.2 mg/dL 1.2 1.7(H) 2.0(H)  Alkaline Phos 38 - 126 U/L 145(H) 142(H) 163(H)  AST 15 - 41 U/L 46(H) 59(H) 82(H)  ALT 0 - 44 U/L 31 34 46(H)       DIAGNOSTIC IMAGING:  I have independently reviewed the scans and discussed with the patient.   I have reviewed Venita Lick LPN's note and agree with the documentation.  I personally performed a face-to-face visit, made revisions and my assessment and plan is as follows.    ASSESSMENT & PLAN:   Malignant neoplasm of central portion of left breast in female, estrogen receptor positive ([place]) 1.  Metastatic breast cancer to the liver: - Left breast cancer diagnosed in January 2017, treated with chemotherapy with dose dense AC followed by Taxol, lumpectomy. - CT CAP on 06/17/2018 shows very large hepatic mass measuring 22 cm, mildly enlarged left internal mammary node, borderline enlarged left lower leg node. -Biopsy of the liver mass on 06/26/2018 shows breast cancer, ER/PR positive and HER-2 negative. -PET CT scan on 07/13/2018 shows very large centrally necrotic mass involving the right and left hepatic lobes, and other hypermetabolic lesion in the left lobe.  Multiple hypermetabolic lymph nodes in the upper abdomen, left supraclavicular space.  MRI of the T-spine on 08/31/2018 shows possible small vertebral body metastasis at T5 and T8. - Abemaciclib and Faslodex from 07/02/2018 through 09/07/2018 discontinued for progression. - ERCP and plastic stent in the hepatic duct on 09/22/2018 by Dr.Rehman - Paclitaxel 60 mg/m2 3 weeks on 1 week off started on 09/18/2018. -We have added Xeloda to the regimen.  Unfortunately she could not tolerate after first week. -We have reviewed her blood work today.  ANC is 1475.  Total bilirubin improved to 1.2.  We will proceed with paclitaxel at the  same dose of 60 mg per metered square. -We will reevaluate her in 1 week.  If ANC is above 1500, I will increase her to full dose paclitaxel.  2.  Constipation: -She could not tolerate lactulose as she could not swallow it. -She is taking Colace 2 tablets twice daily.  I have suggested her to take MiraLAX as needed.  3.  Nutrition: -She is eating 3 meals a day and is snacking.  She cannot tolerate Ensure.  She will try [employer].      Orders placed this encounter:  No orders of the defined types were placed in this encounter.     Derek Jack, MD [place] [phone]

## 2018-10-30 NOTE — Patient Instructions (Signed)
[facility] Discharge Instructions for Patients Receiving Chemotherapy   Beginning January 23rd 2017 lab work for the Cancer Center will be done in the  Main lab at [facility] on 1st floor. If you have a lab appointment with the Cancer Center please come in thru the  Main Entrance and check in at the main information desk   Today you received the following chemotherapy agents Taxol. Follow-up as scheduled. Call clinic for any questions or concerns  To help prevent nausea and vomiting after your treatment, we encourage you to take your nausea medication   If you develop nausea and vomiting, or diarrhea that is not controlled by your medication, call the clinic.  The clinic phone number is [phone]. Office hours are Monday-Friday 8:30am-5:00pm.  BELOW ARE SYMPTOMS THAT SHOULD BE REPORTED IMMEDIATELY:  *FEVER GREATER THAN 101.0 F  *CHILLS WITH OR WITHOUT FEVER  NAUSEA AND VOMITING THAT IS NOT CONTROLLED WITH YOUR NAUSEA MEDICATION  *UNUSUAL SHORTNESS OF BREATH  *UNUSUAL BRUISING OR BLEEDING  TENDERNESS IN MOUTH AND THROAT WITH OR WITHOUT PRESENCE OF ULCERS  *URINARY PROBLEMS  *BOWEL PROBLEMS  UNUSUAL RASH Items with * indicate a potential emergency and should be followed up as soon as possible. If you have an emergency after office hours please contact your primary care physician or go to the nearest emergency department.  Please call the clinic during office hours if you have any questions or concerns.   You may also contact the Patient Navigator at [phone] should you have any questions or need assistance in obtaining follow up care.      Resources For Cancer Patients and their Caregivers ? American Cancer Society: Can assist with transportation, wigs, general needs, runs Look Good Feel Better.        1-888-227-6333 ? Cancer Care: Provides financial assistance, online support groups, medication/co-pay assistance.  1-800-813-HOPE  (4673) ? [facility] Assists [place] cancer patients and their families through emotional , educational and financial support.  336-427-4357 ? [place] DSS Where to apply for food stamps, Medicaid and utility assistance. 336-342-1394 ? [facility]: Transportation to medical appointments. 336-347-2287 ? Social Security Administration: May apply for disability if have a Stage IV cancer. 336-342-7796 1-800-772-1213 ? [place] Aging, Disability and Transit Services: Assists with nutrition, care and transit needs. 336-349-2343         

## 2018-10-30 NOTE — Progress Notes (Signed)
Pamela Long tolerated Taxol infusion well without complaints or incident. VSS upon discharge. PICC line flushed per protocol with dressing and cap changed as well. PICC site without redness swelling or drainage noted. Pt discharged self ambulatory in satisfactory condition

## 2018-10-30 NOTE — Progress Notes (Signed)
Patient to treatment room for chemo and oncology follow up visit.  Patient is experiencing taste bud changes.  Reviewed ways to help with foods.  Problems with constipation.  Reviewed stool softeners and miralax.  Dr. [name] notified with oncology follow up visit.  No s/s of distress noted.   Patient seen by Dr. [name] with lab review ANC 1.4 today with verbal order ok to treat per Dr. [name].

## 2018-11-06 ENCOUNTER — Inpatient Hospital Stay (HOSPITAL_BASED_OUTPATIENT_CLINIC_OR_DEPARTMENT_OTHER): Payer: 59 | Admitting: Hematology

## 2018-11-06 ENCOUNTER — Encounter (HOSPITAL_COMMUNITY): Payer: Self-pay | Admitting: Hematology

## 2018-11-06 ENCOUNTER — Inpatient Hospital Stay (HOSPITAL_COMMUNITY): Payer: 59

## 2018-11-06 ENCOUNTER — Other Ambulatory Visit: Payer: Self-pay

## 2018-11-06 DIAGNOSIS — Z79899 Other long term (current) drug therapy: Secondary | ICD-10-CM

## 2018-11-06 DIAGNOSIS — Z9012 Acquired absence of left breast and nipple: Secondary | ICD-10-CM

## 2018-11-06 DIAGNOSIS — F419 Anxiety disorder, unspecified: Secondary | ICD-10-CM

## 2018-11-06 DIAGNOSIS — C50112 Malignant neoplasm of central portion of left female breast: Secondary | ICD-10-CM | POA: Diagnosis not present

## 2018-11-06 DIAGNOSIS — Z9013 Acquired absence of bilateral breasts and nipples: Secondary | ICD-10-CM

## 2018-11-06 DIAGNOSIS — N3281 Overactive bladder: Secondary | ICD-10-CM

## 2018-11-06 DIAGNOSIS — Z90710 Acquired absence of both cervix and uterus: Secondary | ICD-10-CM

## 2018-11-06 DIAGNOSIS — C50919 Malignant neoplasm of unspecified site of unspecified female breast: Secondary | ICD-10-CM

## 2018-11-06 DIAGNOSIS — K5900 Constipation, unspecified: Secondary | ICD-10-CM

## 2018-11-06 DIAGNOSIS — K219 Gastro-esophageal reflux disease without esophagitis: Secondary | ICD-10-CM

## 2018-11-06 DIAGNOSIS — Z170 Estrogen receptor positive status [ER+]: Secondary | ICD-10-CM

## 2018-11-06 DIAGNOSIS — Z800 Family history of malignant neoplasm of digestive organs: Secondary | ICD-10-CM

## 2018-11-06 DIAGNOSIS — C787 Secondary malignant neoplasm of liver and intrahepatic bile duct: Secondary | ICD-10-CM

## 2018-11-06 DIAGNOSIS — R200 Anesthesia of skin: Secondary | ICD-10-CM

## 2018-11-06 DIAGNOSIS — R944 Abnormal results of kidney function studies: Secondary | ICD-10-CM

## 2018-11-06 DIAGNOSIS — R5383 Other fatigue: Secondary | ICD-10-CM

## 2018-11-06 DIAGNOSIS — Z90722 Acquired absence of ovaries, bilateral: Secondary | ICD-10-CM

## 2018-11-06 DIAGNOSIS — R202 Paresthesia of skin: Secondary | ICD-10-CM

## 2018-11-06 DIAGNOSIS — Z801 Family history of malignant neoplasm of trachea, bronchus and lung: Secondary | ICD-10-CM

## 2018-11-06 DIAGNOSIS — J45909 Unspecified asthma, uncomplicated: Secondary | ICD-10-CM

## 2018-11-06 DIAGNOSIS — Z803 Family history of malignant neoplasm of breast: Secondary | ICD-10-CM

## 2018-11-06 DIAGNOSIS — Z5111 Encounter for antineoplastic chemotherapy: Secondary | ICD-10-CM

## 2018-11-06 DIAGNOSIS — D649 Anemia, unspecified: Secondary | ICD-10-CM

## 2018-11-06 DIAGNOSIS — F329 Major depressive disorder, single episode, unspecified: Secondary | ICD-10-CM

## 2018-11-06 LAB — CBC WITH DIFFERENTIAL/PLATELET
Abs Immature Granulocytes: 0 10*3/uL (ref 0.00–0.07)
Basophils Absolute: 0 10*3/uL (ref 0.0–0.1)
Basophils Relative: 2 %
Eosinophils Absolute: 0.1 10*3/uL (ref 0.0–0.5)
Eosinophils Relative: 4 %
HCT: 28 % — ABNORMAL LOW (ref 36.0–46.0)
Hemoglobin: 9.5 g/dL — ABNORMAL LOW (ref 12.0–15.0)
Immature Granulocytes: 0 %
Lymphocytes Relative: 64 %
Lymphs Abs: 1 10*3/uL (ref 0.7–4.0)
MCH: 30.4 pg (ref 26.0–34.0)
MCHC: 33.9 g/dL (ref 30.0–36.0)
MCV: 89.5 fL (ref 80.0–100.0)
Monocytes Absolute: 0.2 10*3/uL (ref 0.1–1.0)
Monocytes Relative: 11 %
Neutro Abs: 0.3 10*3/uL — ABNORMAL LOW (ref 1.7–7.7)
Neutrophils Relative %: 19 %
Platelets: 169 10*3/uL (ref 150–400)
RBC: 3.13 MIL/uL — ABNORMAL LOW (ref 3.87–5.11)
RDW: 15.1 % (ref 11.5–15.5)
WBC: 1.5 10*3/uL — ABNORMAL LOW (ref 4.0–10.5)
nRBC: 0 % (ref 0.0–0.2)

## 2018-11-06 LAB — COMPREHENSIVE METABOLIC PANEL
ALT: 32 U/L (ref 0–44)
AST: 43 U/L — ABNORMAL HIGH (ref 15–41)
Albumin: 3 g/dL — ABNORMAL LOW (ref 3.5–5.0)
Alkaline Phosphatase: 175 U/L — ABNORMAL HIGH (ref 38–126)
Anion gap: 9 (ref 5–15)
BUN: 8 mg/dL (ref 6–20)
CO2: 27 mmol/L (ref 22–32)
Calcium: 8.6 mg/dL — ABNORMAL LOW (ref 8.9–10.3)
Chloride: 99 mmol/L (ref 98–111)
Creatinine, Ser: 0.6 mg/dL (ref 0.44–1.00)
GFR calc Af Amer: >60 mL/min (ref >60)
GFR calc non Af Amer: >60 mL/min (ref >60)
Glucose, Bld: 126 mg/dL — ABNORMAL HIGH (ref 70–99)
Potassium: 3.3 mmol/L — ABNORMAL LOW (ref 3.5–5.1)
Sodium: 135 mmol/L (ref 135–145)
Total Bilirubin: 1.7 mg/dL — ABNORMAL HIGH (ref 0.3–1.2)
Total Protein: 6.8 g/dL (ref 6.5–8.1)

## 2018-11-06 LAB — MAGNESIUM: Magnesium: 1.9 mg/dL (ref 1.7–2.4)

## 2018-11-06 MED ORDER — HEPARIN SOD (PORK) LOCK FLUSH 100 UNIT/ML IV SOLN
500.0000 [IU] | Freq: Once | INTRAVENOUS | Status: AC
  Administered 2018-11-06: 300 [IU] via INTRAVENOUS

## 2018-11-06 MED ORDER — SODIUM CHLORIDE 0.9% FLUSH
10.0000 mL | INTRAVENOUS | Status: DC | PRN
  Administered 2018-11-06: 10 mL via INTRAVENOUS
  Filled 2018-11-06: qty 10

## 2018-11-06 MED ORDER — FILGRASTIM-SNDZ 480 MCG/0.8ML IJ SOSY
480.0000 ug | PREFILLED_SYRINGE | Freq: Once | INTRAMUSCULAR | Status: AC
  Administered 2018-11-06: 480 ug via SUBCUTANEOUS
  Filled 2018-11-06: qty 0.8

## 2018-11-06 NOTE — Patient Instructions (Signed)
[facility] Discharge Instructions  You were seen today by Dr. Katragadda. He went over your recent lab results. He will see you back in 1 weeks for labs and follow up.   Thank you for choosing [facility] at [facility] to provide your oncology and hematology care.  To afford each patient quality time with our provider, please arrive at least 15 minutes before your scheduled appointment time.   If you have a lab appointment with the Cancer Center please come in thru the  Main Entrance and check in at the main information desk  You need to re-schedule your appointment should you arrive 10 or more minutes late.  We strive to give you quality time with our providers, and arriving late affects you and other patients whose appointments are after yours.  Also, if you no show three or more times for appointments you may be dismissed from the clinic at the providers discretion.     Again, thank you for choosing [facility].  Our hope is that these requests will decrease the amount of time that you wait before being seen by our physicians.       _____________________________________________________________  Should you have questions after your visit to  Cancer Center, please contact our office at [phone] between the hours of 8:00 a.m. and 4:30 p.m.  Voicemails left after 4:00 p.m. will not be returned until the following business day.  For prescription refill requests, have your pharmacy contact our office and allow 72 hours.    Cancer Center Support Programs:   > Cancer Support Group  2nd Tuesday of the month 1pm-2pm, Journey Room    

## 2018-11-06 NOTE — Progress Notes (Signed)
[place], [place] [address]   CLINIC:  Medical Oncology/Hematology  PCP:  Doree Albee, MD [place] [address] [phone]   REASON FOR VISIT:  Follow-up for breast cancer   BRIEF ONCOLOGIC HISTORY:    Metastatic breast cancer ([place])   07/02/2018 Initial Diagnosis    Metastatic breast cancer ([place])    09/18/2018 -  Chemotherapy    The patient had palonosetron (ALOXI) injection 0.25 mg, 0.25 mg, Intravenous,  Once, 2 of 4 cycles Administration: 0.25 mg (10/01/2018), 0.25 mg (10/09/2018), 0.25 mg (10/23/2018), 0.25 mg (10/30/2018) PACLitaxel (TAXOL) 120 mg in sodium chloride 0.9 % 250 mL chemo infusion (</= 59m/m2), 60 mg/m2 = 120 mg (100 % of original dose 60 mg/m2), Intravenous,  Once, 2 of 4 cycles Dose modification: 60 mg/m2 (original dose 60 mg/m2, Cycle 1, Reason: Change in LFTs) Administration: 120 mg (09/18/2018), 120 mg (10/01/2018), 120 mg (10/23/2018), 120 mg (10/09/2018), 120 mg (10/30/2018)  for chemotherapy treatment.       CANCER STAGING: Cancer Staging No matching staging information was found for the patient.   INTERVAL HISTORY:  Ms. 89. female returns for routine follow-up and consideration for next cycle of chemotherapy.  She has occasional nausea.  Her main problem is constipation.  She is taking stool softeners and MiraLAX.  She could not tolerate lactulose.  She has some ulcers at the angles of the mouth.  She continues to have sleep problems.  Appetite and energy are 50%.  She is eating 3 meals a day.  She has not tried carnation instant breakfast yet.    REVIEW OF SYSTEMS:  Review of Systems  Constitutional: Positive for fatigue.  Gastrointestinal: Positive for constipation and nausea.  All other systems reviewed and are negative.    PAST MEDICAL/SURGICAL HISTORY:  Past Medical History:  Diagnosis Date  . Anemia   . Anxiety   . Asthma   . Breast cancer ([place]    Left, 2017  .  Depression   . GERD (gastroesophageal reflux disease)   . Migraine   . OAB (overactive bladder)   . Seasonal allergies    Past Surgical History:  Procedure Laterality Date  . ABDOMINAL HYSTERECTOMY     breast cancer  . ANKLE RECONSTRUCTION Right 1989  . BILIARY STENT PLACEMENT N/A 09/22/2018   Procedure: BILIARY STENT PLACEMENT;  Surgeon: RRogene Houston MD;  [facility];  Service: Endoscopy;  Laterality: N/A;  . BREAST SURGERY    . COLONOSCOPY WITH PROPOFOL N/A 08/06/2018   Procedure: COLONOSCOPY WITH PROPOFOL;  Surgeon: FDanie Binder MD;  Location: [facility];  Service: Endoscopy;  Laterality: N/A;  1:30pm  . ERCP N/A 09/22/2018   Procedure: ENDOSCOPIC RETROGRADE CHOLANGIOPANCREATOGRAPHY (ERCP);  Surgeon: RRogene Houston MD;  Location: [facility];  Service: Endoscopy;  Laterality: N/A;  . FOREIGN BODY REMOVAL N/A 08/29/2017   from lip  . KNEE SURGERY Right 1989   bone spur  . MASTECTOMY  2017   bil mastectomies  . SINUSOTOMY       SOCIAL HISTORY:  Social History   Socioeconomic History  . Marital status: Single    Spouse name: Not on file  . Number of children: 1  . Years of education: 122 . Highest education level: Not on file  Occupational History  . Occupation: disabled  Social Needs  . Financial resource strain: Somewhat hard  . Food insecurity:  Worry: Sometimes true    Inability: Sometimes true  . Transportation needs:    Medical: No    Non-medical: No  Tobacco Use  . Smoking status: Never Smoker  . Smokeless tobacco: Never Used  Substance and Sexual Activity  . Alcohol use: No  . Drug use: No  . Sexual activity: Not Currently  Lifestyle  . Physical activity:    Days per week: 0 days    Minutes per session: 0 min  . Stress: Rather much  Relationships  . Social connections:    Talks on phone: Once a week    Gets together: Once a week    Attends religious service: 1 to 4 times per year    Active member of club or  organization: Yes    Attends meetings of clubs or organizations: Never    Relationship status: Never married  . Intimate partner violence:    Fear of current or ex partner: No    Emotionally abused: No    Physically abused: No    Forced sexual activity: No  Other Topics Concern  . Not on file  Social History Narrative   Bachelors degree   Accounting   Lives with daughter [name] who has autism and diabetes   Likes to sew, quilt, crafts, crochet    FAMILY HISTORY:  Family History  Problem Relation Age of Onset  . Arthritis Mother   . COPD Mother   . Depression Mother   . Diabetes Mother   . Kidney disease Father   . Heart disease Father 68  . Drug abuse Father   . Hypertension Father   . Diabetes Daughter   . Hashimoto's thyroiditis Daughter   . Irritable bowel syndrome Daughter   . Autism spectrum disorder Daughter   . Early death Maternal Grandmother        drowned  . Early death Maternal Grandfather   . Heart disease Maternal Grandfather   . Heart disease Paternal Grandfather   . Breast cancer Maternal Aunt   . Breast cancer Cousin   . Lung cancer Maternal Aunt   . Lung cancer Maternal Uncle   . Colon cancer Neg Hx     CURRENT MEDICATIONS:  Outpatient Encounter Medications as of 11/06/2018  Medication Sig  . antiseptic oral rinse (BIOTENE) LIQD 15 mLs by Mouth Rinse route as needed for dry mouth.  . docusate sodium (COLACE) 100 MG capsule Take 1 capsule (100 mg total) by mouth 2 (two) times daily as needed for mild constipation.  Marland Kitchen escitalopram (LEXAPRO) 20 MG tablet Take 1 tablet (20 mg total) by mouth daily.  . magnesium oxide (MAG-OX) 400 (241.3 Mg) MG tablet Take 1 tablet (400 mg total) by mouth daily.  . naproxen sodium (ALEVE) 220 MG tablet Take 220-440 mg by mouth 2 (two) times daily as needed (for pain or headache).   . ondansetron (ZOFRAN ODT) 8 MG disintegrating tablet Take 1 tablet (8 mg total) by mouth every 8 (eight) hours as needed for nausea or  vomiting.  Marland Kitchen PACLitaxel (TAXOL IV) Inject into the vein once a week.   . Polyvinyl Alcohol-Povidone PF (REFRESH) 1.4-0.6 % SOLN Place 1 drop into both eyes as needed (for dry eyes).   . prochlorperazine (COMPAZINE) 10 MG tablet Take 1 tablet (10 mg total) by mouth every 6 (six) hours as needed for nausea or vomiting.  . promethazine (PHENERGAN) 25 MG tablet Take 1 tablet (25 mg total) by mouth every 6 (six) hours as needed for nausea  or vomiting.  . rizatriptan (MAXALT-MLT) 10 MG disintegrating tablet Take 10 mg by mouth every 2 (two) hours as needed for migraine.   . traMADol (ULTRAM) 50 MG tablet Take 1 tablet (50 mg total) by mouth 2 (two) times daily. (Patient taking differently: Take 50 mg by mouth as needed. )  . Cholecalciferol (VITAMIN D3 GUMMIES) 25 MCG (1000 UT) CHEW Chew 2,000-4,000 Units by mouth daily.  Marland Kitchen HYDROmorphone (DILAUDID) 2 MG tablet Take 1-2 tablets (2-4 mg total) by mouth every 6 (six) hours as needed for severe pain. (Patient not taking: Reported on 10/16/2018)  . lidocaine (LIDODERM) 5 % Place 1 patch onto the skin daily. Remove & Discard patch within 12 hours or as directed by MD (Patient not taking: Reported on 11/06/2018)  . magic mouthwash w/lidocaine SOLN Take 5 mLs by mouth 4 (four) times daily as needed for mouth pain. (Patient not taking: Reported on 11/06/2018)  . pantoprazole (PROTONIX) 40 MG tablet TAKE 1 TABLET BY MOUTH DAILY BEFORE BREAKFAST (Patient not taking: No sig reported)  . zolpidem (AMBIEN) 10 MG tablet Take 1 tablet (10 mg total) by mouth at bedtime as needed for up to 30 days for sleep.  . [DISCONTINUED] magnesium oxide (MAG-OX) 400 MG tablet Take 1 tablet by mouth daily.   No facility-administered encounter medications on file as of 11/06/2018.     ALLERGIES:  Allergies  Allergen Reactions  . Corn-Containing Products Other (See Comments)    Headache and GI upset  . Salagen [Pilocarpine] Other (See Comments)    Can cause liver failure   . Tape  Itching and Other (See Comments)    Depending on the adhesive-blistering occurs  . Amoxicillin Hives and Other (See Comments)    Rash only DID THE REACTION INVOLVE: Swelling of the face/tongue/throat, SOB, or low BP? Sudden or severe rash/hives, skin peeling, or the inside of the mouth or nose?  Did it require medical treatment?  When did it last happen? If all above answers are "NO", may proceed with cephalosporin use.   . Caffeine Diarrhea, Nausea Only, Palpitations and Other (See Comments)    Headache  . Tetanus Toxoids Swelling and Other (See Comments)    Local reaction     PHYSICAL EXAM:  ECOG Performance status: 1  Vitals:   11/06/18 0839  BP: 112/63  Pulse: 99  Resp: 18  Temp: 98.7 F (37.1 C)  SpO2: 97%   Filed Weights   11/06/18 0839  Weight: 170 lb 9.6 oz (77.4 kg)    Physical Exam Vitals signs reviewed.  Constitutional:      Appearance: Normal appearance.  Cardiovascular:     Rate and Rhythm: Normal rate and regular rhythm.     Heart sounds: Normal heart sounds.  Pulmonary:     Effort: Pulmonary effort is normal.     Breath sounds: Normal breath sounds.  Abdominal:     General: There is no distension.     Palpations: Abdomen is soft. There is no mass.  Musculoskeletal:        General: No swelling.  Skin:    General: Skin is warm.  Neurological:     General: No focal deficit present.     Mental Status: She is alert and oriented to person, place, and time.  Psychiatric:        Mood and Affect: Mood normal.        Behavior: Behavior normal.      LABORATORY DATA:  I have reviewed the labs as  listed.  CBC    Component Value Date/Time   WBC 1.5 (L) 11/06/2018 0833   RBC 3.13 (L) 11/06/2018 0833   HGB 9.5 (L) 11/06/2018 0833   HCT 28.0 (L) 11/06/2018 0833   PLT 169 11/06/2018 0833   MCV 89.5 11/06/2018 0833   MCH 30.4 11/06/2018 0833   MCHC 33.9 11/06/2018 0833   RDW 15.1 11/06/2018 0833   LYMPHSABS 1.0 11/06/2018 0833   MONOABS  0.2 11/06/2018 0833   EOSABS 0.1 11/06/2018 0833   BASOSABS 0.0 11/06/2018 0833   CMP Latest Ref Rng & Units 11/06/2018 10/30/2018 10/23/2018  Glucose 70 - 99 mg/dL 126(H) 134(H) 146(H)  BUN 6 - 20 mg/dL _0 Creatinine 0.44 - 1.00 mg/dL 0.60 0.50 0.60  Sodium 135 - 145 mmol/L 135 137 138  Potassium 3.5 - 5.1 mmol/L 3.3(L) 3.5 3.1(L)  Chloride 98 - 111 mmol/L 99 104 104  CO2 22 - 32 mmol/L _1 Calcium 8.9 - 10.3 mg/dL 8.6(L) 8.4(L) 8.0(L)  Total Protein 6.5 - 8.1 g/dL 6.8 6.2(L) 6.1(L)  Total Bilirubin 0.3 - 1.2 mg/dL 1.7(H) 1.2 1.7(H)  Alkaline Phos 38 - 126 U/L 175(H) 145(H) 142(H)  AST 15 - 41 U/L 43(H) 46(H) 59(H)  ALT 0 - 44 U/L 32 31 34       DIAGNOSTIC IMAGING:  I have independently reviewed the scans and discussed with the patient.   I have reviewed Venita Lick LPN's note and agree with the documentation.  I personally performed a face-to-face visit, made revisions and my assessment and plan is as follows.    ASSESSMENT & PLAN:   Metastatic breast cancer ([place]) 1.  Metastatic breast cancer to the liver: - Left breast cancer diagnosed in January 2017, treated with chemotherapy with dose dense AC followed by Taxol, lumpectomy. - CT CAP on 06/17/2018 shows very large hepatic mass measuring 22 cm, mildly enlarged left internal mammary node, borderline enlarged left lower leg node. -Biopsy of the liver mass on 06/26/2018 shows breast cancer, ER/PR positive and HER-2 negative. -PET CT scan on 07/13/2018 shows very large centrally necrotic mass involving the right and left hepatic lobes, and other hypermetabolic lesion in the left lobe.  Multiple hypermetabolic lymph nodes in the upper abdomen, left supraclavicular space.  MRI of the T-spine on 08/31/2018 shows possible small vertebral body metastasis at T5 and T8. - Abemaciclib and Faslodex from 07/02/2018 through 09/07/2018 discontinued for progression. - ERCP and plastic stent in the hepatic duct on 09/22/2018 by Dr.Rehman  - Paclitaxel 60 mg/m2 3 weeks on 1 week off started on 09/18/2018. -We have added Xeloda to the regimen.  Unfortunately she could not tolerate after first week. - ANC 300 today. We will hold treatment x 1 week and administer Neupogen today. - Of note, Bilirubin has increased to 1.7. We will follow this closely.  - RTC in 1 week.   2.  Constipation: -She could not tolerate lactulose as she could not swallow it. -She is taking Colace 2 tablets twice daily.  I have suggested her to take MiraLAX as needed.  3.  Nutrition: -She is eating 3 meals a day and is snacking.  She cannot tolerate Ensure.  She will try [employer].   Time spent is 25 minutes with more than 50% of the time spent face-to-face discussing treatment plan and coordination of care.  Orders placed this encounter:  No orders of the defined types were placed in this encounter.  Derek Jack, MD [place] [phone]

## 2018-11-06 NOTE — Assessment & Plan Note (Signed)
1.  Metastatic breast cancer to the liver: - Left breast cancer diagnosed in January 2017, treated with chemotherapy with dose dense AC followed by Taxol, lumpectomy. - CT CAP on 06/17/2018 shows very large hepatic mass measuring 22 cm, mildly enlarged left internal mammary node, borderline enlarged left lower leg node. -Biopsy of the liver mass on 06/26/2018 shows breast cancer, ER/PR positive and HER-2 negative. -PET CT scan on 07/13/2018 shows very large centrally necrotic mass involving the right and left hepatic lobes, and other hypermetabolic lesion in the left lobe.  Multiple hypermetabolic lymph nodes in the upper abdomen, left supraclavicular space.  MRI of the T-spine on 08/31/2018 shows possible small vertebral body metastasis at T5 and T8. - Abemaciclib and Faslodex from 07/02/2018 through 09/07/2018 discontinued for progression. - ERCP and plastic stent in the hepatic duct on 09/22/2018 by Dr.Rehman - Paclitaxel 60 mg/m2 3 weeks on 1 week off started on 09/18/2018. -We have added Xeloda to the regimen.  Unfortunately she could not tolerate after first week. - ANC 300 today. We will hold treatment x 1 week and administer Neupogen today. - Of note, Bilirubin has increased to 1.7. We will follow this closely.  - RTC in 1 week.   2.  Constipation: -She could not tolerate lactulose as she could not swallow it. -She is taking Colace 2 tablets twice daily.  I have suggested her to take MiraLAX as needed.  3.  Nutrition: -She is eating 3 meals a day and is snacking.  She cannot tolerate Ensure.  She will try [employer].

## 2018-11-06 NOTE — Progress Notes (Signed)
No treatment today per MD. Labs reviewed at office visit and [place] is too low for treatment today. Will give Zarxio today once authorized.   PICC line Dressing change done today.   Gracelyn Coventry Lanzer presented for PICC line flush. PICC line located right arm Good blood return present. PICC line flushed with 72ml NS and 300U/29ml Heparin. Procedure without incident. Patient tolerated procedure well.  Treatment given per orders. Patient tolerated it well without problems. Vitals stable and discharged home from clinic ambulatory. Follow up as scheduled.

## 2018-11-10 ENCOUNTER — Other Ambulatory Visit (HOSPITAL_COMMUNITY): Payer: Self-pay | Admitting: Nurse Practitioner

## 2018-11-10 DIAGNOSIS — C50919 Malignant neoplasm of unspecified site of unspecified female breast: Secondary | ICD-10-CM

## 2018-11-11 ENCOUNTER — Other Ambulatory Visit: Payer: Self-pay

## 2018-11-12 ENCOUNTER — Inpatient Hospital Stay (HOSPITAL_BASED_OUTPATIENT_CLINIC_OR_DEPARTMENT_OTHER): Payer: 59 | Admitting: Hematology

## 2018-11-12 ENCOUNTER — Encounter (HOSPITAL_COMMUNITY): Payer: Self-pay | Admitting: Hematology

## 2018-11-12 ENCOUNTER — Inpatient Hospital Stay (HOSPITAL_COMMUNITY): Payer: 59 | Attending: Hematology

## 2018-11-12 ENCOUNTER — Inpatient Hospital Stay (HOSPITAL_COMMUNITY): Payer: 59

## 2018-11-12 ENCOUNTER — Encounter (HOSPITAL_COMMUNITY): Payer: Self-pay

## 2018-11-12 VITALS — BP 98/63 | HR 77 | Temp 98.6°F | Resp 18

## 2018-11-12 DIAGNOSIS — Z801 Family history of malignant neoplasm of trachea, bronchus and lung: Secondary | ICD-10-CM | POA: Insufficient documentation

## 2018-11-12 DIAGNOSIS — N3281 Overactive bladder: Secondary | ICD-10-CM

## 2018-11-12 DIAGNOSIS — C50919 Malignant neoplasm of unspecified site of unspecified female breast: Secondary | ICD-10-CM

## 2018-11-12 DIAGNOSIS — Z5111 Encounter for antineoplastic chemotherapy: Secondary | ICD-10-CM

## 2018-11-12 DIAGNOSIS — R5383 Other fatigue: Secondary | ICD-10-CM

## 2018-11-12 DIAGNOSIS — E876 Hypokalemia: Secondary | ICD-10-CM | POA: Diagnosis not present

## 2018-11-12 DIAGNOSIS — R0602 Shortness of breath: Secondary | ICD-10-CM | POA: Insufficient documentation

## 2018-11-12 DIAGNOSIS — K5900 Constipation, unspecified: Secondary | ICD-10-CM | POA: Diagnosis not present

## 2018-11-12 DIAGNOSIS — G4700 Insomnia, unspecified: Secondary | ICD-10-CM | POA: Diagnosis not present

## 2018-11-12 DIAGNOSIS — Z803 Family history of malignant neoplasm of breast: Secondary | ICD-10-CM | POA: Diagnosis not present

## 2018-11-12 DIAGNOSIS — C50912 Malignant neoplasm of unspecified site of left female breast: Secondary | ICD-10-CM

## 2018-11-12 DIAGNOSIS — C787 Secondary malignant neoplasm of liver and intrahepatic bile duct: Secondary | ICD-10-CM | POA: Insufficient documentation

## 2018-11-12 DIAGNOSIS — F329 Major depressive disorder, single episode, unspecified: Secondary | ICD-10-CM

## 2018-11-12 DIAGNOSIS — Z170 Estrogen receptor positive status [ER+]: Secondary | ICD-10-CM | POA: Insufficient documentation

## 2018-11-12 DIAGNOSIS — R197 Diarrhea, unspecified: Secondary | ICD-10-CM | POA: Insufficient documentation

## 2018-11-12 DIAGNOSIS — R42 Dizziness and giddiness: Secondary | ICD-10-CM | POA: Diagnosis not present

## 2018-11-12 DIAGNOSIS — Z79899 Other long term (current) drug therapy: Secondary | ICD-10-CM

## 2018-11-12 DIAGNOSIS — K219 Gastro-esophageal reflux disease without esophagitis: Secondary | ICD-10-CM

## 2018-11-12 DIAGNOSIS — J45909 Unspecified asthma, uncomplicated: Secondary | ICD-10-CM | POA: Diagnosis not present

## 2018-11-12 DIAGNOSIS — K769 Liver disease, unspecified: Secondary | ICD-10-CM | POA: Diagnosis not present

## 2018-11-12 LAB — CBC WITH DIFFERENTIAL/PLATELET
Abs Immature Granulocytes: 0.21 10*3/uL — ABNORMAL HIGH (ref 0.00–0.07)
Basophils Absolute: 0.1 10*3/uL (ref 0.0–0.1)
Basophils Relative: 1 %
Eosinophils Absolute: 0 10*3/uL (ref 0.0–0.5)
Eosinophils Relative: 1 %
HCT: 29.1 % — ABNORMAL LOW (ref 36.0–46.0)
Hemoglobin: 9.4 g/dL — ABNORMAL LOW (ref 12.0–15.0)
Immature Granulocytes: 4 %
Lymphocytes Relative: 24 %
Lymphs Abs: 1.3 10*3/uL (ref 0.7–4.0)
MCH: 29.5 pg (ref 26.0–34.0)
MCHC: 32.3 g/dL (ref 30.0–36.0)
MCV: 91.2 fL (ref 80.0–100.0)
Monocytes Absolute: 1.1 10*3/uL — ABNORMAL HIGH (ref 0.1–1.0)
Monocytes Relative: 20 %
Neutro Abs: 2.7 10*3/uL (ref 1.7–7.7)
Neutrophils Relative %: 50 %
Platelets: 127 10*3/uL — ABNORMAL LOW (ref 150–400)
RBC: 3.19 MIL/uL — ABNORMAL LOW (ref 3.87–5.11)
RDW: 15.6 % — ABNORMAL HIGH (ref 11.5–15.5)
WBC: 5.4 10*3/uL (ref 4.0–10.5)
nRBC: 0 % (ref 0.0–0.2)

## 2018-11-12 LAB — COMPREHENSIVE METABOLIC PANEL
ALT: 33 U/L (ref 0–44)
AST: 55 U/L — ABNORMAL HIGH (ref 15–41)
Albumin: 2.7 g/dL — ABNORMAL LOW (ref 3.5–5.0)
Alkaline Phosphatase: 169 U/L — ABNORMAL HIGH (ref 38–126)
Anion gap: 10 (ref 5–15)
BUN: 9 mg/dL (ref 6–20)
CO2: 27 mmol/L (ref 22–32)
Calcium: 8.6 mg/dL — ABNORMAL LOW (ref 8.9–10.3)
Chloride: 100 mmol/L (ref 98–111)
Creatinine, Ser: 0.54 mg/dL (ref 0.44–1.00)
GFR calc Af Amer: >60 mL/min (ref >60)
GFR calc non Af Amer: >60 mL/min (ref >60)
Glucose, Bld: 115 mg/dL — ABNORMAL HIGH (ref 70–99)
Potassium: 3.2 mmol/L — ABNORMAL LOW (ref 3.5–5.1)
Sodium: 137 mmol/L (ref 135–145)
Total Bilirubin: 0.9 mg/dL (ref 0.3–1.2)
Total Protein: 6 g/dL — ABNORMAL LOW (ref 6.5–8.1)

## 2018-11-12 MED ORDER — SODIUM CHLORIDE 0.9 % IV SOLN
10.0000 mg | Freq: Once | INTRAVENOUS | Status: AC
  Administered 2018-11-12: 10 mg via INTRAVENOUS
  Filled 2018-11-12: qty 10

## 2018-11-12 MED ORDER — HEPARIN SOD (PORK) LOCK FLUSH 100 UNIT/ML IV SOLN
250.0000 [IU] | Freq: Once | INTRAVENOUS | Status: AC | PRN
  Administered 2018-11-12: 250 [IU]

## 2018-11-12 MED ORDER — FAMOTIDINE IN NACL 20-0.9 MG/50ML-% IV SOLN
20.0000 mg | Freq: Once | INTRAVENOUS | Status: AC
  Administered 2018-11-12: 20 mg via INTRAVENOUS
  Filled 2018-11-12: qty 50

## 2018-11-12 MED ORDER — SODIUM CHLORIDE 0.9 % IV SOLN
Freq: Once | INTRAVENOUS | Status: AC
  Administered 2018-11-12: 09:00:00 via INTRAVENOUS

## 2018-11-12 MED ORDER — SODIUM CHLORIDE 0.9 % IV SOLN
80.0000 mg/m2 | Freq: Once | INTRAVENOUS | Status: AC
  Administered 2018-11-12: 162 mg via INTRAVENOUS
  Filled 2018-11-12: qty 27

## 2018-11-12 MED ORDER — PALONOSETRON HCL INJECTION 0.25 MG/5ML
0.2500 mg | Freq: Once | INTRAVENOUS | Status: AC
  Administered 2018-11-12: 0.25 mg via INTRAVENOUS
  Filled 2018-11-12: qty 5

## 2018-11-12 MED ORDER — DIPHENHYDRAMINE HCL 50 MG/ML IJ SOLN
25.0000 mg | Freq: Once | INTRAMUSCULAR | Status: AC
  Administered 2018-11-12: 25 mg via INTRAVENOUS
  Filled 2018-11-12: qty 1

## 2018-11-12 MED ORDER — SODIUM CHLORIDE 0.9% FLUSH
3.0000 mL | INTRAVENOUS | Status: DC | PRN
  Administered 2018-11-12: 3 mL
  Filled 2018-11-12: qty 10

## 2018-11-12 NOTE — Patient Instructions (Addendum)
[facility] at [facility] Discharge Instructions  You were seen today by Dr. Katragadda. He went over your recent lab results. He will see you back in 1 week for labs, treatment and follow up.   Thank you for choosing [facility] at [facility] to provide your oncology and hematology care.  To afford each patient quality time with our provider, please arrive at least 15 minutes before your scheduled appointment time.   If you have a lab appointment with the Cancer Center please come in thru the  Main Entrance and check in at the main information desk  You need to re-schedule your appointment should you arrive 10 or more minutes late.  We strive to give you quality time with our providers, and arriving late affects you and other patients whose appointments are after yours.  Also, if you no show three or more times for appointments you may be dismissed from the clinic at the providers discretion.     Again, thank you for choosing [facility].  Our hope is that these requests will decrease the amount of time that you wait before being seen by our physicians.       _____________________________________________________________  Should you have questions after your visit to [facility], please contact our office at [phone] between the hours of 8:00 a.m. and 4:30 p.m.  Voicemails left after 4:00 p.m. will not be returned until the following business day.  For prescription refill requests, have your pharmacy contact our office and allow 72 hours.    Cancer Center Support Programs:   > Cancer Support Group  2nd Tuesday of the month 1pm-2pm, Journey Room    

## 2018-11-12 NOTE — Progress Notes (Signed)
[id number] Labs reviewed with and pt seen by Dr. [name] today and pt approved for Taxol infusion with a small increase in the dosage per MD                                                Pamela Long tolerated Taxol infusion well without complaints or incident. PICC line dressing and cap changed per protocol with site clean and dry without redness or drainage noted. VSS upon discharge. Pt discharged self ambulatory in satisfactory ocndition

## 2018-11-12 NOTE — Progress Notes (Signed)
[place], [place] [address]   CLINIC:  Medical Oncology/Hematology  PCP:  Doree Albee, MD [place] [address] [phone]   REASON FOR VISIT:  Follow-up for metastatic breast cancer.   BRIEF ONCOLOGIC HISTORY:    Metastatic breast cancer ([place])   07/02/2018 Initial Diagnosis    Metastatic breast cancer ([place])    09/18/2018 -  Chemotherapy    The patient had palonosetron (ALOXI) injection 0.25 mg, 0.25 mg, Intravenous,  Once, 2 of 4 cycles Administration: 0.25 mg (10/01/2018), 0.25 mg (10/09/2018), 0.25 mg (10/23/2018), 0.25 mg (10/30/2018), 0.25 mg (11/12/2018) PACLitaxel (TAXOL) 120 mg in sodium chloride 0.9 % 250 mL chemo infusion (</= 30m/m2), 60 mg/m2 = 120 mg (100 % of original dose 60 mg/m2), Intravenous,  Once, 2 of 4 cycles Dose modification: 60 mg/m2 (original dose 60 mg/m2, Cycle 1, Reason: Change in LFTs), 80 mg/m2 (original dose 60 mg/m2, Cycle 2, Reason: Provider Judgment) Administration: 120 mg (09/18/2018), 120 mg (10/01/2018), 120 mg (10/23/2018), 120 mg (10/09/2018), 120 mg (10/30/2018), 162 mg (11/12/2018)  for chemotherapy treatment.       CANCER STAGING: Cancer Staging No matching staging information was found for the patient.   INTERVAL HISTORY:  Ms. 89. female returns for consideration of next cycle of chemotherapy.  She reports constipation is better.  Appetite and energy levels are 75%.  Hemorrhoids are better controlled when she started using Anusol wipes.  Overall last week has been better in terms of energy and appetite.  Denies any tingling or numbness in the extremities.  Last treatment was on 10/30/2018.  We held her last week because of low blood counts.  She does have occasional sleep problems.  REVIEW OF SYSTEMS:  Review of Systems  Constitutional: Positive for fatigue.  Respiratory: Positive for shortness of breath.   Gastrointestinal: Negative for constipation and nausea.   Neurological: Positive for dizziness.  Psychiatric/Behavioral: Positive for sleep disturbance.  All other systems reviewed and are negative.    PAST MEDICAL/SURGICAL HISTORY:  Past Medical History:  Diagnosis Date  . Anemia   . Anxiety   . Asthma   . Breast cancer ([facility]    Left, 2017  . Depression   . GERD (gastroesophageal reflux disease)   . Migraine   . OAB (overactive bladder)   . Seasonal allergies    Past Surgical History:  Procedure Laterality Date  . ABDOMINAL HYSTERECTOMY     breast cancer  . ANKLE RECONSTRUCTION Right 1989  . BILIARY STENT PLACEMENT N/A 09/22/2018   Procedure: BILIARY STENT PLACEMENT;  Surgeon: RRogene Houston MD;  [facility];  Service: Endoscopy;  Laterality: N/A;  . BREAST SURGERY    . COLONOSCOPY WITH PROPOFOL N/A 08/06/2018   Procedure: COLONOSCOPY WITH PROPOFOL;  Surgeon: FDanie Binder MD;  Location: [facility];  Service: Endoscopy;  Laterality: N/A;  1:30pm  . ERCP N/A 09/22/2018   Procedure: ENDOSCOPIC RETROGRADE CHOLANGIOPANCREATOGRAPHY (ERCP);  Surgeon: RRogene Houston MD;  Location: [facility];  Service: Endoscopy;  Laterality: N/A;  . FOREIGN BODY REMOVAL N/A 08/29/2017   from lip  . KNEE SURGERY Right 1989   bone spur  . MASTECTOMY  2017   bil mastectomies  . SINUSOTOMY       SOCIAL HISTORY:  Social History   Socioeconomic History  . Marital status: Single    Spouse name: Not on file  . Number of children: 1  .  Years of education: 68  . Highest education level: Not on file  Occupational History  . Occupation: disabled  Social Needs  . Financial resource strain: Somewhat hard  . Food insecurity:    Worry: Sometimes true    Inability: Sometimes true  . Transportation needs:    Medical: No    Non-medical: No  Tobacco Use  . Smoking status: Never Smoker  . Smokeless tobacco: Never Used  Substance and Sexual Activity  . Alcohol use: No  . Drug use: No  . Sexual activity: Not Currently   Lifestyle  . Physical activity:    Days per week: 0 days    Minutes per session: 0 min  . Stress: Rather much  Relationships  . Social connections:    Talks on phone: Once a week    Gets together: Once a week    Attends religious service: 1 to 4 times per year    Active member of club or organization: Yes    Attends meetings of clubs or organizations: Never    Relationship status: Never married  . Intimate partner violence:    Fear of current or ex partner: No    Emotionally abused: No    Physically abused: No    Forced sexual activity: No  Other Topics Concern  . Not on file  Social History Narrative   Bachelors degree   Accounting   Lives with daughter [name] who has autism and diabetes   Likes to sew, quilt, crafts, crochet    FAMILY HISTORY:  Family History  Problem Relation Age of Onset  . Arthritis Mother   . COPD Mother   . Depression Mother   . Diabetes Mother   . Kidney disease Father   . Heart disease Father 86  . Drug abuse Father   . Hypertension Father   . Diabetes Daughter   . Hashimoto's thyroiditis Daughter   . Irritable bowel syndrome Daughter   . Autism spectrum disorder Daughter   . Early death Maternal Grandmother        drowned  . Early death Maternal Grandfather   . Heart disease Maternal Grandfather   . Heart disease Paternal Grandfather   . Breast cancer Maternal Aunt   . Breast cancer Cousin   . Lung cancer Maternal Aunt   . Lung cancer Maternal Uncle   . Colon cancer Neg Hx     CURRENT MEDICATIONS:  Outpatient Encounter Medications as of 11/12/2018  Medication Sig  . antiseptic oral rinse (BIOTENE) LIQD 15 mLs by Mouth Rinse route as needed for dry mouth.  . Cholecalciferol (VITAMIN D3 GUMMIES) 25 MCG (1000 UT) CHEW Chew 2,000-4,000 Units by mouth daily.  Marland Kitchen docusate sodium (COLACE) 100 MG capsule Take 1 capsule (100 mg total) by mouth 2 (two) times daily as needed for mild constipation.  Marland Kitchen escitalopram (LEXAPRO) 20 MG tablet Take 1  tablet (20 mg total) by mouth daily.  Marland Kitchen HYDROmorphone (DILAUDID) 2 MG tablet Take 1-2 tablets (2-4 mg total) by mouth every 6 (six) hours as needed for severe pain. (Patient not taking: Reported on 10/16/2018)  . lidocaine (LIDODERM) 5 % Place 1 patch onto the skin daily. Remove & Discard patch within 12 hours or as directed by MD (Patient not taking: Reported on 11/06/2018)  . magic mouthwash w/lidocaine SOLN Take 5 mLs by mouth 4 (four) times daily as needed for mouth pain. (Patient not taking: Reported on 11/06/2018)  . magnesium oxide (MAG-OX) 400 MG tablet TAKE 1 TABLET BY  MOUTH EVERY DAY  . naproxen sodium (ALEVE) 220 MG tablet Take 220-440 mg by mouth 2 (two) times daily as needed (for pain or headache).   . ondansetron (ZOFRAN ODT) 8 MG disintegrating tablet Take 1 tablet (8 mg total) by mouth every 8 (eight) hours as needed for nausea or vomiting.  Marland Kitchen PACLitaxel (TAXOL IV) Inject into the vein once a week.   . pantoprazole (PROTONIX) 40 MG tablet TAKE 1 TABLET BY MOUTH DAILY BEFORE BREAKFAST (Patient not taking: No sig reported)  . Polyvinyl Alcohol-Povidone PF (REFRESH) 1.4-0.6 % SOLN Place 1 drop into both eyes as needed (for dry eyes).   . prochlorperazine (COMPAZINE) 10 MG tablet Take 1 tablet (10 mg total) by mouth every 6 (six) hours as needed for nausea or vomiting.  . promethazine (PHENERGAN) 25 MG tablet Take 1 tablet (25 mg total) by mouth every 6 (six) hours as needed for nausea or vomiting.  . rizatriptan (MAXALT-MLT) 10 MG disintegrating tablet Take 10 mg by mouth every 2 (two) hours as needed for migraine.   . traMADol (ULTRAM) 50 MG tablet Take 1 tablet (50 mg total) by mouth 2 (two) times daily. (Patient taking differently: Take 50 mg by mouth as needed. )  . zolpidem (AMBIEN) 10 MG tablet Take 1 tablet (10 mg total) by mouth at bedtime as needed for up to 30 days for sleep.   No facility-administered encounter medications on file as of 11/12/2018.     ALLERGIES:  Allergies   Allergen Reactions  . Corn-Containing Products Other (See Comments)    Headache and GI upset  . Salagen [Pilocarpine] Other (See Comments)    Can cause liver failure   . Tape Itching and Other (See Comments)    Depending on the adhesive-blistering occurs  . Amoxicillin Hives and Other (See Comments)    Rash only DID THE REACTION INVOLVE: Swelling of the face/tongue/throat, SOB, or low BP? Sudden or severe rash/hives, skin peeling, or the inside of the mouth or nose?  Did it require medical treatment?  When did it last happen? If all above answers are "NO", may proceed with cephalosporin use.   . Caffeine Diarrhea, Nausea Only, Palpitations and Other (See Comments)    Headache  . Tetanus Toxoids Swelling and Other (See Comments)    Local reaction     PHYSICAL EXAM:  ECOG Performance status: 1  Vitals:   11/12/18 0801  BP: 110/69  Pulse: 88  Resp: 18  Temp: 99.4 F (37.4 C)  SpO2: 96%   Filed Weights   11/12/18 0801  Weight: 180 lb (81.6 kg)    Physical Exam Vitals signs reviewed.  Constitutional:      Appearance: Normal appearance.  Cardiovascular:     Rate and Rhythm: Normal rate and regular rhythm.     Heart sounds: Normal heart sounds.  Pulmonary:     Effort: Pulmonary effort is normal.     Breath sounds: Normal breath sounds.  Abdominal:     General: There is no distension.     Palpations: Abdomen is soft. There is no mass.  Musculoskeletal:        General: No swelling.  Skin:    General: Skin is warm.  Neurological:     General: No focal deficit present.     Mental Status: She is alert and oriented to person, place, and time.  Psychiatric:        Mood and Affect: Mood normal.        Behavior: Behavior  normal.      LABORATORY DATA:  I have reviewed the labs as listed.  CBC    Component Value Date/Time   WBC 5.4 11/12/2018 0803   RBC 3.19 (L) 11/12/2018 0803   HGB 9.4 (L) 11/12/2018 0803   HCT 29.1 (L) 11/12/2018 0803   PLT 127 (L)  11/12/2018 0803   MCV 91.2 11/12/2018 0803   MCH 29.5 11/12/2018 0803   MCHC 32.3 11/12/2018 0803   RDW 15.6 (H) 11/12/2018 0803   LYMPHSABS 1.3 11/12/2018 0803   MONOABS 1.1 (H) 11/12/2018 0803   EOSABS 0.0 11/12/2018 0803   BASOSABS 0.1 11/12/2018 0803   CMP Latest Ref Rng & Units 11/12/2018 11/06/2018 10/30/2018  Glucose 70 - 99 mg/dL 115(H) 126(H) 134(H)  BUN 6 - 20 mg/dL '9 8 10  ' Creatinine 0.44 - 1.00 mg/dL 0.54 0.60 0.50  Sodium 135 - 145 mmol/L 137 135 137  Potassium 3.5 - 5.1 mmol/L 3.2(L) 3.3(L) 3.5  Chloride 98 - 111 mmol/L 100 99 104  CO2 22 - 32 mmol/L '27 27 24  ' Calcium 8.9 - 10.3 mg/dL 8.6(L) 8.6(L) 8.4(L)  Total Protein 6.5 - 8.1 g/dL 6.0(L) 6.8 6.2(L)  Total Bilirubin 0.3 - 1.2 mg/dL 0.9 1.7(H) 1.2  Alkaline Phos 38 - 126 U/L 169(H) 175(H) 145(H)  AST 15 - 41 U/L 55(H) 43(H) 46(H)  ALT 0 - 44 U/L 33 32 31       DIAGNOSTIC IMAGING:  I have independently reviewed the scans and discussed with the patient.   I have reviewed Venita Lick LPN's note and agree with the documentation.  I personally performed a face-to-face visit, made revisions and my assessment and plan is as follows.    ASSESSMENT & PLAN:   Metastatic breast cancer ([place]) 1.  Metastatic breast cancer to the liver: - Left breast cancer diagnosed in January 2017, treated with chemotherapy with dose dense AC followed by Taxol, lumpectomy. - CT CAP on 06/17/2018 shows very large hepatic mass measuring 22 cm, mildly enlarged left internal mammary node, borderline enlarged left lower leg node. -Biopsy of the liver mass on 06/26/2018 shows breast cancer, ER/PR positive and HER-2 negative. -PET CT scan on 07/13/2018 shows very large centrally necrotic mass involving the right and left hepatic lobes, and other hypermetabolic lesion in the left lobe.  Multiple hypermetabolic lymph nodes in the upper abdomen, left supraclavicular space.  MRI of the T-spine on 08/31/2018 shows possible small vertebral body metastasis  at T5 and T8. - Abemaciclib and Faslodex from 07/02/2018 through 09/07/2018 discontinued for progression. - ERCP and plastic stent in the hepatic duct on 09/22/2018 by Dr.Rehman - Paclitaxel 60 mg/m2 3 weeks on 1 week off started on 09/18/2018. -She could not tolerate Xeloda when added to her current regimen.   -Last week we had to hold her chemo because of neutropenia. - This week she feels much better.  Her bilirubin has normalized.  I will increase paclitaxel to 80 mg per metered square. -We will reevaluate her in 1 week.  2.  Constipation: -She could not tolerate lactulose as she could not swallow it. -She is taking stool softener and MiraLAX which is controlling it. -She is also using Anusol HC wipes for hemorrhoids.  3.  Nutrition: -She is eating 3 meals a day and is snacking.  Her weight actually improved.    Time spent is 25 minutes with more than 50% of the time spent face-to-face discussing treatment plan and coordination of care.  Orders placed this encounter:  No orders of the defined types were placed in this encounter.     Derek Jack, MD [place] [phone]

## 2018-11-12 NOTE — Patient Instructions (Signed)
[facility] Discharge Instructions for Patients Receiving Chemotherapy   Beginning January 23rd 2017 lab work for the Cancer Center will be done in the  Main lab at [facility] on 1st floor. If you have a lab appointment with the Cancer Center please come in thru the  Main Entrance and check in at the main information desk   Today you received the following chemotherapy agents Taxol. Follow-up as scheduled. Call clinic for any questions or concerns  To help prevent nausea and vomiting after your treatment, we encourage you to take your nausea medication   If you develop nausea and vomiting, or diarrhea that is not controlled by your medication, call the clinic.  The clinic phone number is [phone]. Office hours are Monday-Friday 8:30am-5:00pm.  BELOW ARE SYMPTOMS THAT SHOULD BE REPORTED IMMEDIATELY:  *FEVER GREATER THAN 101.0 F  *CHILLS WITH OR WITHOUT FEVER  NAUSEA AND VOMITING THAT IS NOT CONTROLLED WITH YOUR NAUSEA MEDICATION  *UNUSUAL SHORTNESS OF BREATH  *UNUSUAL BRUISING OR BLEEDING  TENDERNESS IN MOUTH AND THROAT WITH OR WITHOUT PRESENCE OF ULCERS  *URINARY PROBLEMS  *BOWEL PROBLEMS  UNUSUAL RASH Items with * indicate a potential emergency and should be followed up as soon as possible. If you have an emergency after office hours please contact your primary care physician or go to the nearest emergency department.  Please call the clinic during office hours if you have any questions or concerns.   You may also contact the Patient Navigator at [phone] should you have any questions or need assistance in obtaining follow up care.      Resources For Cancer Patients and their Caregivers ? American Cancer Society: Can assist with transportation, wigs, general needs, runs Look Good Feel Better.        1-888-227-6333 ? Cancer Care: Provides financial assistance, online support groups, medication/co-pay assistance.  1-800-813-HOPE  (4673) ? [facility] Assists [place] cancer patients and their families through emotional , educational and financial support.  336-427-4357 ? [place] DSS Where to apply for food stamps, Medicaid and utility assistance. 336-342-1394 ? [facility]: Transportation to medical appointments. 336-347-2287 ? Social Security Administration: May apply for disability if have a Stage IV cancer. 336-342-7796 1-800-772-1213 ? [place] Aging, Disability and Transit Services: Assists with nutrition, care and transit needs. 336-349-2343         

## 2018-11-12 NOTE — Assessment & Plan Note (Signed)
1.  Metastatic breast cancer to the liver: - Left breast cancer diagnosed in January 2017, treated with chemotherapy with dose dense AC followed by Taxol, lumpectomy. - CT CAP on 06/17/2018 shows very large hepatic mass measuring 22 cm, mildly enlarged left internal mammary node, borderline enlarged left lower leg node. -Biopsy of the liver mass on 06/26/2018 shows breast cancer, ER/PR positive and HER-2 negative. -PET CT scan on 07/13/2018 shows very large centrally necrotic mass involving the right and left hepatic lobes, and other hypermetabolic lesion in the left lobe.  Multiple hypermetabolic lymph nodes in the upper abdomen, left supraclavicular space.  MRI of the T-spine on 08/31/2018 shows possible small vertebral body metastasis at T5 and T8. - Abemaciclib and Faslodex from 07/02/2018 through 09/07/2018 discontinued for progression. - ERCP and plastic stent in the hepatic duct on 09/22/2018 by Dr.Rehman - Paclitaxel 60 mg/m2 3 weeks on 1 week off started on 09/18/2018. -She could not tolerate Xeloda when added to her current regimen.   -Last week we had to hold her chemo because of neutropenia. - This week she feels much better.  Her bilirubin has normalized.  I will increase paclitaxel to 80 mg per metered square. -We will reevaluate her in 1 week.  2.  Constipation: -She could not tolerate lactulose as she could not swallow it. -She is taking stool softener and MiraLAX which is controlling it. -She is also using Anusol HC wipes for hemorrhoids.  3.  Nutrition: -She is eating 3 meals a day and is snacking.  Her weight actually improved.

## 2018-11-12 NOTE — Patient Instructions (Signed)
[place] at [facility] Discharge Instructions  Labs drawn from PICC line today   Thank you for choosing [place] at [facility] to provide your oncology and hematology care.  To afford each patient quality time with our provider, please arrive at least 15 minutes before your scheduled appointment time.   If you have a lab appointment with the [facility] please come in thru the  Main Entrance and check in at the main information desk  You need to re-schedule your appointment should you arrive 10 or more minutes late.  We strive to give you quality time with our providers, and arriving late affects you and other patients whose appointments are after yours.  Also, if you no show three or more times for appointments you may be dismissed from the clinic at the providers discretion.     Again, thank you for choosing [facility].  Our hope is that these requests will decrease the amount of time that you wait before being seen by our physicians.       _____________________________________________________________  Should you have questions after your visit to [facility], please contact our office at [phone] between the hours of 8:00 a.m. and 4:30 p.m.  Voicemails left after 4:00 p.m. will not be returned until the following business day.  For prescription refill requests, have your pharmacy contact our office and allow 72 hours.    Cancer Center Support Programs:   > Cancer Support Group  2nd Tuesday of the month 1pm-2pm, Journey Room

## 2018-11-14 ENCOUNTER — Other Ambulatory Visit: Payer: Self-pay | Admitting: Gastroenterology

## 2018-11-20 ENCOUNTER — Encounter (HOSPITAL_COMMUNITY): Payer: Self-pay | Admitting: Nurse Practitioner

## 2018-11-21 ENCOUNTER — Emergency Department (HOSPITAL_COMMUNITY)
Admission: EM | Admit: 2018-11-21 | Discharge: 2018-11-21 | Disposition: A | Discharge status: Home / Self Care | Payer: 59 | Attending: Emergency Medicine | Admitting: Emergency Medicine

## 2018-11-21 ENCOUNTER — Encounter (HOSPITAL_COMMUNITY): Payer: Self-pay

## 2018-11-21 ENCOUNTER — Other Ambulatory Visit: Payer: Self-pay

## 2018-11-21 DIAGNOSIS — Z79899 Other long term (current) drug therapy: Secondary | ICD-10-CM | POA: Insufficient documentation

## 2018-11-21 DIAGNOSIS — H9203 Otalgia, bilateral: Secondary | ICD-10-CM | POA: Diagnosis not present

## 2018-11-21 DIAGNOSIS — K140 Glossitis: Secondary | ICD-10-CM

## 2018-11-21 DIAGNOSIS — C50112 Malignant neoplasm of central portion of left female breast: Secondary | ICD-10-CM | POA: Diagnosis not present

## 2018-11-21 DIAGNOSIS — Z9013 Acquired absence of bilateral breasts and nipples: Secondary | ICD-10-CM | POA: Insufficient documentation

## 2018-11-21 DIAGNOSIS — Z170 Estrogen receptor positive status [ER+]: Secondary | ICD-10-CM | POA: Insufficient documentation

## 2018-11-21 DIAGNOSIS — B370 Candidal stomatitis: Secondary | ICD-10-CM | POA: Insufficient documentation

## 2018-11-21 DIAGNOSIS — K1379 Other lesions of oral mucosa: Secondary | ICD-10-CM | POA: Diagnosis present

## 2018-11-21 MED ORDER — OXYCODONE-ACETAMINOPHEN 5-325 MG PO TABS: 1.0000 | ORAL_TABLET | ORAL | 0 refills | Status: DC | PRN

## 2018-11-21 MED ORDER — MAGIC MOUTHWASH W/LIDOCAINE: 5.0000 mL | Freq: Four times a day (QID) | ORAL | 2 refills | Status: DC | PRN

## 2018-11-21 MED ORDER — NYSTATIN 100000 UNIT/ML MT SUSP: 500000.0000 [IU] | Freq: Four times a day (QID) | OROMUCOSAL | 0 refills | Status: DC

## 2018-11-21 NOTE — ED Notes (Signed)
ED Provider at bedside. 

## 2018-11-21 NOTE — ED Triage Notes (Signed)
Pt receives chemotherapy for breast Cancer, c/o oral pain for several days as well as both ears.   Pt is using magic mouth wash, alleve without relief.

## 2018-11-21 NOTE — ED Provider Notes (Signed)
[facility] EMERGENCY DEPARTMENT Provider Note   CSN: [id number] Arrival date & time: 11/21/18  0404   History   Chief Complaint Chief Complaint  Patient presents with  . Oral Pain    HPI Pamela Long is a 48 y.o. female.   The history is provided by the patient.  She has history of breast cancer currently undergoing chemotherapy and comes in complaining of pain in her ears and in her tongue.  Pain started about 3 days ago.  She has been taking Aleve and using Magic mouthwash without any benefit.  She tried taking tramadol which did not give her relief either.  She denies fever or chills.  She received her last chemotherapy infusion 9 days ago.  Past Medical History:  Diagnosis Date  . Anemia   . Anxiety   . Asthma   . Breast cancer ([place])    Left, 2017  . Depression   . GERD (gastroesophageal reflux disease)   . Migraine   . OAB (overactive bladder)   . Seasonal allergies     Patient Active Problem List   Diagnosis Date Noted  . Obstructive jaundice 09/21/2018  . Goals of care, counseling/discussion 09/17/2018  . Abdominal pain 09/09/2018  . RUQ pain 07/08/2018  . Metastatic breast cancer ([place]) 07/02/2018  . Liver mass 06/27/2018  . GERD (gastroesophageal reflux disease) 04/02/2018  . Rectal bleeding 04/02/2018  . Constipation 04/02/2018  . Acquired absence of left breast 08/01/2017  . OAB (overactive bladder) 07/31/2017  . Vitamin D deficiency 02/26/2017  . Prediabetes 02/26/2017  . HLD (hyperlipidemia) 02/26/2017  . Asthma 02/12/2017  . Depression 02/12/2017  . Migraines 02/12/2017  . Arthralgia 02/12/2017  . Sleep-disordered breathing 02/12/2017  . Positive ANA (antinuclear antibody) 02/12/2017  . Angular cheilitis 02/12/2017  . Class 2 obesity due to excess calories without serious comorbidity with body mass index (BMI) of 35.0 to 35.9 in adult 02/12/2017  . Malignant neoplasm of central portion of left breast in female, estrogen receptor positive  ([place]) 01/05/2017    Past Surgical History:  Procedure Laterality Date  . ABDOMINAL HYSTERECTOMY     breast cancer  . ANKLE RECONSTRUCTION Right 1989  . BILIARY STENT PLACEMENT N/A 09/22/2018   Procedure: BILIARY STENT PLACEMENT;  Surgeon: Rogene Houston, MD;  Location[facility];  Service: Endoscopy;  Laterality: N/A;  . BREAST SURGERY    . COLONOSCOPY WITH PROPOFOL N/A 08/06/2018   Procedure: COLONOSCOPY WITH PROPOFOL;  Surgeon: Danie Binder, MD;  Location: [facility] ENDO SUITE;  Service: Endoscopy;  Laterality: N/A;  1:30pm  . ERCP N/A 09/22/2018   Procedure: ENDOSCOPIC RETROGRADE CHOLANGIOPANCREATOGRAPHY (ERCP);  Surgeon: Rogene Houston, MD;  Location: [facility] ENDO SUITE;  Service: Endoscopy;  Laterality: N/A;  . FOREIGN BODY REMOVAL N/A 08/29/2017   from lip  . KNEE SURGERY Right 1989   bone spur  . MASTECTOMY  2017   bil mastectomies  . SINUSOTOMY       OB History    Gravida  1   Para  1   Term  1   Preterm      AB      Living        SAB      TAB      Ectopic      Multiple      Live Births               Home Medications    Prior to Admission medications  Medication Sig Start Date End Date Taking? Authorizing Provider  antiseptic oral rinse (BIOTENE) LIQD 15 mLs by Mouth Rinse route as needed for dry mouth.    [provider]  Cholecalciferol (VITAMIN D3 GUMMIES) 25 MCG (1000 UT) CHEW Chew 2,000-4,000 Units by mouth daily.    [provider]  docusate sodium (COLACE) 100 MG capsule Take 1 capsule (100 mg total) by mouth 2 (two) times daily as needed for mild constipation. 09/10/18 09/10/19  Barton Dubois, MD  escitalopram (LEXAPRO) 20 MG tablet Take 1 tablet (20 mg total) by mouth daily. 02/12/17   Raylene Everts, MD  HYDROmorphone (DILAUDID) 2 MG tablet Take 1-2 tablets (2-4 mg total) by mouth every 6 (six) hours as needed for severe pain. Patient not taking: Reported on 10/16/2018 09/22/18   Derek Jack, MD  lidocaine (LIDODERM)  5 % Place 1 patch onto the skin daily. Remove & Discard patch within 12 hours or as directed by MD Patient not taking: Reported on 11/06/2018 09/01/18   Derek Jack, MD  magic mouthwash w/lidocaine SOLN Take 5 mLs by mouth 4 (four) times daily as needed for mouth pain. Patient not taking: Reported on 11/06/2018 09/24/18   Derek Jack, MD  magnesium oxide (MAG-OX) 400 MG tablet TAKE 1 TABLET BY MOUTH EVERY DAY 11/10/18   Lockamy, Randi L, NP-C  naproxen sodium (ALEVE) 220 MG tablet Take 220-440 mg by mouth 2 (two) times daily as needed (for pain or headache).     [provider]  ondansetron (ZOFRAN ODT) 8 MG disintegrating tablet Take 1 tablet (8 mg total) by mouth every 8 (eight) hours as needed for nausea or vomiting. 09/17/18   Derek Jack, MD  PACLitaxel (TAXOL IV) Inject into the vein once a week.     [provider]  pantoprazole (PROTONIX) 40 MG tablet TAKE 1 TABLET BY MOUTH EVERY DAY BEFORE BREAKFAST 11/19/18   Annitta Needs, NP  Polyvinyl Alcohol-Povidone PF (REFRESH) 1.4-0.6 % SOLN Place 1 drop into both eyes as needed (for dry eyes).     [provider]  prochlorperazine (COMPAZINE) 10 MG tablet Take 1 tablet (10 mg total) by mouth every 6 (six) hours as needed for nausea or vomiting. 09/18/18   Derek Jack, MD  promethazine (PHENERGAN) 25 MG tablet Take 1 tablet (25 mg total) by mouth every 6 (six) hours as needed for nausea or vomiting. 10/02/18   Derek Jack, MD  rizatriptan (MAXALT-MLT) 10 MG disintegrating tablet Take 10 mg by mouth every 2 (two) hours as needed for migraine.     [provider]  traMADol (ULTRAM) 50 MG tablet Take 1 tablet (50 mg total) by mouth 2 (two) times daily. Patient taking differently: Take 50 mg by mouth as needed.  08/19/18   Derek Jack, MD  zolpidem (AMBIEN) 10 MG tablet Take 1 tablet (10 mg total) by mouth at bedtime as needed for up to 30 days for sleep. 10/09/18 11/08/18   Derek Jack, MD    Family History Family History  Problem Relation Age of Onset  . Arthritis Mother   . COPD Mother   . Depression Mother   . Diabetes Mother   . Kidney disease Father   . Heart disease Father 58  . Drug abuse Father   . Hypertension Father   . Diabetes Daughter   . Hashimoto's thyroiditis Daughter   . Irritable bowel syndrome Daughter   . Autism spectrum disorder Daughter   . Early death Maternal Grandmother  drowned  . Early death Maternal Grandfather   . Heart disease Maternal Grandfather   . Heart disease Paternal Grandfather   . Breast cancer Maternal Aunt   . Breast cancer Cousin   . Lung cancer Maternal Aunt   . Lung cancer Maternal Uncle   . Colon cancer Neg Hx     Social History Social History   Tobacco Use  . Smoking status: Never Smoker  . Smokeless tobacco: Never Used  Substance Use Topics  . Alcohol use: No  . Drug use: No     Allergies   Corn-containing products, Salagen [pilocarpine], Tape, Amoxicillin, Caffeine, and Tetanus toxoids   Review of Systems Review of Systems  All other systems reviewed and are negative.    Physical Exam Updated Vital Signs BP 132/68 (BP Location: Right Arm)   Pulse 100   Temp 98.3 F (36.8 C) (Oral)   Resp 18   Ht 5' 5.5" (1.664 m)   Wt 79.4 kg   SpO2 100%   BMI 28.68 kg/m   Physical Exam Vitals signs and nursing note reviewed.    48 year old female, resting comfortably and in no acute distress. Vital signs are normal. Oxygen saturation is 100%, which is normal. Head is normocephalic and atraumatic. PERRLA, EOMI. Tympanic membranes are clear.  Examination of the mouth shows some mild edema of the tongue with white patches over the posterior half of the tongue consistent with oral candidiasis. Neck is nontender and supple without adenopathy or JVD. Back is nontender and there is no CVA tenderness. Lungs are clear without rales, wheezes, or rhonchi. Chest is nontender.  Heart has regular rate and rhythm without murmur. Abdomen is soft, flat, nontender without masses or hepatosplenomegaly and peristalsis is normoactive. Extremities have no cyanosis or edema, full range of motion is present. Skin is warm and dry without rash. Neurologic: Mental status is normal, cranial nerves are intact, there are no motor or sensory deficits.  ED Treatments / Results   Procedures Procedures   Medications Ordered in ED Medications - No data to display   Initial Impression / Assessment and Plan / ED Course  I have reviewed the triage vital signs and the nursing notes.  Glossitis secondary to oral candidiasis.  Ear pain which I believe is referred pain, no evidence of any ear pathology on exam.  Old records are reviewed showing infusion of Taxol on June 4.  She has had significant leukopenia in response to prior Taxol infusions.  She is given a prescription for Mycostatin oral suspension and prescription for oxycodone-acetaminophen for pain control.  Advised to continue using Magic mouthwash as needed.  Follow-up with her PCP and with her oncologist.  Final Clinical Impressions(s) / ED Diagnoses   Final diagnoses:  Oral candidiasis  Glossitis  Acute pain of both ears    ED Discharge Orders         Ordered    oxyCODONE-acetaminophen (PERCOCET) 5-325 MG tablet  Every 4 hours PRN     11/21/18 0445    oxyCODONE-acetaminophen (PERCOCET) 5-325 MG tablet  Every 4 hours PRN     11/21/18 0445    nystatin (MYCOSTATIN) 100000 UNIT/ML suspension  4 times daily     11/21/18 0445    magic mouthwash w/lidocaine SOLN  4 times daily PRN,   Status:  Discontinued     11/21/18 0446    magic mouthwash w/lidocaine SOLN  4 times daily PRN     11/21/18 0447  Delora Fuel, MD 91/50/41 [phone]

## 2018-11-21 NOTE — Discharge Instructions (Addendum)
Continue using Magic Mouthwash as needed.  Return if you ever run a fever higher than 100.3.

## 2018-11-23 MED FILL — Oxycodone w/ Acetaminophen Tab 5-325 MG: ORAL | Qty: 6 | Status: AC

## 2018-11-26 ENCOUNTER — Encounter (HOSPITAL_COMMUNITY): Payer: Self-pay | Admitting: Hematology

## 2018-11-26 ENCOUNTER — Inpatient Hospital Stay (HOSPITAL_COMMUNITY): Payer: 59

## 2018-11-26 ENCOUNTER — Inpatient Hospital Stay (HOSPITAL_BASED_OUTPATIENT_CLINIC_OR_DEPARTMENT_OTHER): Payer: 59 | Admitting: Hematology

## 2018-11-26 ENCOUNTER — Other Ambulatory Visit: Payer: Self-pay

## 2018-11-26 DIAGNOSIS — R42 Dizziness and giddiness: Secondary | ICD-10-CM

## 2018-11-26 DIAGNOSIS — K5900 Constipation, unspecified: Secondary | ICD-10-CM

## 2018-11-26 DIAGNOSIS — C50919 Malignant neoplasm of unspecified site of unspecified female breast: Secondary | ICD-10-CM

## 2018-11-26 DIAGNOSIS — G4700 Insomnia, unspecified: Secondary | ICD-10-CM

## 2018-11-26 DIAGNOSIS — C787 Secondary malignant neoplasm of liver and intrahepatic bile duct: Secondary | ICD-10-CM

## 2018-11-26 DIAGNOSIS — R5383 Other fatigue: Secondary | ICD-10-CM

## 2018-11-26 DIAGNOSIS — Z5111 Encounter for antineoplastic chemotherapy: Secondary | ICD-10-CM | POA: Diagnosis not present

## 2018-11-26 DIAGNOSIS — F329 Major depressive disorder, single episode, unspecified: Secondary | ICD-10-CM

## 2018-11-26 DIAGNOSIS — C50912 Malignant neoplasm of unspecified site of left female breast: Secondary | ICD-10-CM

## 2018-11-26 DIAGNOSIS — E876 Hypokalemia: Secondary | ICD-10-CM

## 2018-11-26 DIAGNOSIS — Z170 Estrogen receptor positive status [ER+]: Secondary | ICD-10-CM

## 2018-11-26 DIAGNOSIS — Z803 Family history of malignant neoplasm of breast: Secondary | ICD-10-CM

## 2018-11-26 DIAGNOSIS — Z801 Family history of malignant neoplasm of trachea, bronchus and lung: Secondary | ICD-10-CM

## 2018-11-26 DIAGNOSIS — K219 Gastro-esophageal reflux disease without esophagitis: Secondary | ICD-10-CM

## 2018-11-26 DIAGNOSIS — J45909 Unspecified asthma, uncomplicated: Secondary | ICD-10-CM

## 2018-11-26 DIAGNOSIS — Z79899 Other long term (current) drug therapy: Secondary | ICD-10-CM

## 2018-11-26 DIAGNOSIS — R197 Diarrhea, unspecified: Secondary | ICD-10-CM

## 2018-11-26 DIAGNOSIS — R0602 Shortness of breath: Secondary | ICD-10-CM

## 2018-11-26 DIAGNOSIS — C50112 Malignant neoplasm of central portion of left female breast: Secondary | ICD-10-CM

## 2018-11-26 DIAGNOSIS — N3281 Overactive bladder: Secondary | ICD-10-CM

## 2018-11-26 LAB — CBC WITH DIFFERENTIAL/PLATELET
Abs Immature Granulocytes: 0.07 10*3/uL (ref 0.00–0.07)
Basophils Absolute: 0 10*3/uL (ref 0.0–0.1)
Basophils Relative: 1 %
Eosinophils Absolute: 0 10*3/uL (ref 0.0–0.5)
Eosinophils Relative: 0 %
HCT: 29.6 % — ABNORMAL LOW (ref 36.0–46.0)
Hemoglobin: 9.6 g/dL — ABNORMAL LOW (ref 12.0–15.0)
Immature Granulocytes: 2 %
Lymphocytes Relative: 35 %
Lymphs Abs: 1.2 10*3/uL (ref 0.7–4.0)
MCH: 28.7 pg (ref 26.0–34.0)
MCHC: 32.4 g/dL (ref 30.0–36.0)
MCV: 88.4 fL (ref 80.0–100.0)
Monocytes Absolute: 1 10*3/uL (ref 0.1–1.0)
Monocytes Relative: 29 %
Neutro Abs: 1.1 10*3/uL — ABNORMAL LOW (ref 1.7–7.7)
Neutrophils Relative %: 33 %
Platelets: 159 10*3/uL (ref 150–400)
RBC: 3.35 MIL/uL — ABNORMAL LOW (ref 3.87–5.11)
RDW: 15.3 % (ref 11.5–15.5)
WBC: 3.5 10*3/uL — ABNORMAL LOW (ref 4.0–10.5)
nRBC: 0 % (ref 0.0–0.2)

## 2018-11-26 LAB — COMPREHENSIVE METABOLIC PANEL
ALT: 23 U/L (ref 0–44)
AST: 45 U/L — ABNORMAL HIGH (ref 15–41)
Albumin: 2.6 g/dL — ABNORMAL LOW (ref 3.5–5.0)
Alkaline Phosphatase: 168 U/L — ABNORMAL HIGH (ref 38–126)
Anion gap: 8 (ref 5–15)
BUN: 6 mg/dL (ref 6–20)
CO2: 28 mmol/L (ref 22–32)
Calcium: 8.5 mg/dL — ABNORMAL LOW (ref 8.9–10.3)
Chloride: 99 mmol/L (ref 98–111)
Creatinine, Ser: 0.62 mg/dL (ref 0.44–1.00)
GFR calc Af Amer: >60 mL/min (ref >60)
GFR calc non Af Amer: >60 mL/min (ref >60)
Glucose, Bld: 125 mg/dL — ABNORMAL HIGH (ref 70–99)
Potassium: 2.7 mmol/L — CL (ref 3.5–5.1)
Sodium: 135 mmol/L (ref 135–145)
Total Bilirubin: 1.2 mg/dL (ref 0.3–1.2)
Total Protein: 5.8 g/dL — ABNORMAL LOW (ref 6.5–8.1)

## 2018-11-26 MED ORDER — SODIUM CHLORIDE 0.9 % IV SOLN
INTRAVENOUS | Status: DC
  Administered 2018-11-26: 10:00:00 via INTRAVENOUS

## 2018-11-26 MED ORDER — SODIUM CHLORIDE 0.9% FLUSH
10.0000 mL | Freq: Once | INTRAVENOUS | Status: AC
  Administered 2018-11-26: 15:00:00 10 mL

## 2018-11-26 MED ORDER — POTASSIUM CHLORIDE CRYS ER 20 MEQ PO TBCR
40.0000 meq | EXTENDED_RELEASE_TABLET | Freq: Once | ORAL | Status: AC
  Administered 2018-11-26: 40 meq via ORAL
  Filled 2018-11-26: qty 2

## 2018-11-26 MED ORDER — HEPARIN SOD (PORK) LOCK FLUSH 100 UNIT/ML IV SOLN
500.0000 [IU] | Freq: Once | INTRAVENOUS | Status: AC
  Administered 2018-11-26: 500 [IU] via INTRAVENOUS

## 2018-11-26 MED ORDER — POTASSIUM CHLORIDE 10 MEQ/100ML IV SOLN
10.0000 meq | INTRAVENOUS | Status: AC
  Administered 2018-11-26 (×4): 10 meq via INTRAVENOUS
  Filled 2018-11-26 (×4): qty 100

## 2018-11-26 MED ORDER — POTASSIUM CHLORIDE CRYS ER 20 MEQ PO TBCR
40.0000 meq | EXTENDED_RELEASE_TABLET | Freq: Once | ORAL | Status: AC
  Administered 2018-11-26: 15:00:00 40 meq via ORAL
  Filled 2018-11-26: qty 2

## 2018-11-26 NOTE — Patient Instructions (Signed)
[facility] at [facility] Discharge Instructions  You were seen today by Dr. Katragadda. He went over your recent lab results. He will see you back in 1 week for labs, treatment and follow up.   Thank you for choosing [facility] at [facility] to provide your oncology and hematology care.  To afford each patient quality time with our provider, please arrive at least 15 minutes before your scheduled appointment time.   If you have a lab appointment with the Cancer Center please come in thru the  Main Entrance and check in at the main information desk  You need to re-schedule your appointment should you arrive 10 or more minutes late.  We strive to give you quality time with our providers, and arriving late affects you and other patients whose appointments are after yours.  Also, if you no show three or more times for appointments you may be dismissed from the clinic at the providers discretion.     Again, thank you for choosing [facility].  Our hope is that these requests will decrease the amount of time that you wait before being seen by our physicians.       _____________________________________________________________  Should you have questions after your visit to  Cancer Center, please contact our office at [phone] between the hours of 8:00 a.m. and 4:30 p.m.  Voicemails left after 4:00 p.m. will not be returned until the following business day.  For prescription refill requests, have your pharmacy contact our office and allow 72 hours.    Cancer Center Support Programs:   > Cancer Support Group  2nd Tuesday of the month 1pm-2pm, Journey Room    

## 2018-11-26 NOTE — Progress Notes (Signed)
CRITICAL VALUE ALERT  Critical Value:  K+ 2.7  Date & Time Notied:  11/26/2018 at 0850 to D. Redmond Pulling, RN  Provider Notified: Dr. [name]  Orders Received/Actions taken: give 40 mEq KCl IV and K-Dur 40 mEq po x 2 doses.

## 2018-11-26 NOTE — Assessment & Plan Note (Signed)
1.  Metastatic breast cancer to the liver: - Left breast cancer diagnosed in January 2017, treated with chemotherapy with dose dense AC followed by Taxol, lumpectomy. - CT CAP on 06/17/2018 shows very large hepatic mass measuring 22 cm, mildly enlarged left internal mammary node, borderline enlarged left lower leg node. -Biopsy of the liver mass on 06/26/2018 shows breast cancer, ER/PR positive and HER-2 negative. -PET CT scan on 07/13/2018 shows very large centrally necrotic mass involving the right and left hepatic lobes, and other hypermetabolic lesion in the left lobe.  Multiple hypermetabolic lymph nodes in the upper abdomen, left supraclavicular space.  MRI of the T-spine on 08/31/2018 shows possible small vertebral body metastasis at T5 and T8. - Abemaciclib and Faslodex from 07/02/2018 through 09/07/2018 discontinued for progression. - ERCP and plastic stent in the hepatic duct on 09/22/2018 by Dr.Rehman - Paclitaxel 60 mg/m2 3 weeks on 1 week off started on 09/18/2018. -She could not tolerate solid when it was added to the current regimen. -I have increased her Taxol 80 mg per metered square on 11/12/2018. -She tolerated very well without any major problems. - Her ANC is 1155 today.  Hence we will hold her treatment today. -She has severe hypokalemia with potassium of 2.7.  She will receive IV and p.o. potassium today. -We will reevaluate her next week.  She would like to have a port placed.  Will make referral to Dr. Arnoldo Morale.  2.  Constipation: -She could not tolerate lactulose as she could not swallow it. -She was taking stool softener and MiraLAX as needed.  However for the last few days she had diarrhea.    3.  Nutrition: -She is eating 3 meals a day and is snacking.  Her weight actually improved.

## 2018-11-26 NOTE — Progress Notes (Signed)
[place], [place] [address]   CLINIC:  Medical Oncology/Hematology  PCP:  Doree Albee, MD [place] [id number] [phone]   REASON FOR VISIT:  Follow-up for metastatic breast cancer.   BRIEF ONCOLOGIC HISTORY:  Oncology History  Metastatic breast cancer ([place])  07/02/2018 Initial Diagnosis   Metastatic breast cancer ([place])   09/18/2018 -  Chemotherapy   The patient had palonosetron (ALOXI) injection 0.25 mg, 0.25 mg, Intravenous,  Once, 2 of 4 cycles Administration: 0.25 mg (10/01/2018), 0.25 mg (10/09/2018), 0.25 mg (10/23/2018), 0.25 mg (10/30/2018), 0.25 mg (11/12/2018) PACLitaxel (TAXOL) 120 mg in sodium chloride 0.9 % 250 mL chemo infusion (</= 59m/m2), 60 mg/m2 = 120 mg (100 % of original dose 60 mg/m2), Intravenous,  Once, 2 of 4 cycles Dose modification: 60 mg/m2 (original dose 60 mg/m2, Cycle 1, Reason: Change in LFTs), 80 mg/m2 (original dose 60 mg/m2, Cycle 2, Reason: Provider Judgment) Administration: 120 mg (09/18/2018), 120 mg (10/01/2018), 120 mg (10/23/2018), 120 mg (10/09/2018), 120 mg (10/30/2018), 162 mg (11/12/2018)  for chemotherapy treatment.       CANCER STAGING: Cancer Staging No matching staging information was found for the patient.   INTERVAL HISTORY:  Ms. NRanganathan89y.o. female is seen for follow-up and next cycle of chemotherapy.  Last cycle of chemotherapy was on 11/12/2018.  I have increased her dose to regular dose at that time.  She did not feel any extra side effects from it.  She does have some diarrhea for the past few days.  She reports not taking her constipation medication.  Appetite is 50% and energy levels are 25%.  Eating has been reasonable.  She is not drinking any protein supplements.  She had some thrush and took nystatin mouthwash.  REVIEW OF SYSTEMS:  Review of Systems  Constitutional: Negative for fatigue.  Respiratory: Positive for shortness of breath.   Gastrointestinal: Positive  for diarrhea.  Neurological: Positive for dizziness.  Psychiatric/Behavioral: Negative for sleep disturbance.  All other systems reviewed and are negative.    PAST MEDICAL/SURGICAL HISTORY:  Past Medical History:  Diagnosis Date  . Anemia   . Anxiety   . Asthma   . [place]    Left, 2017  . Depression   . GERD (gastroesophageal reflux disease)   . Migraine   . OAB (overactive bladder)   . Seasonal allergies    Past Surgical History:  Procedure Laterality Date  . ABDOMINAL HYSTERECTOMY     breast cancer  . ANKLE RECONSTRUCTION Right 1989  . BILIARY STENT PLACEMENT N/A 09/22/2018   Procedure: BILIARY STENT PLACEMENT;  Surgeon: RRogene Houston MD;  [facility];  Service: Endoscopy;  Laterality: N/A;  . BREAST SURGERY    . COLONOSCOPY WITH PROPOFOL N/A 08/06/2018   Procedure: COLONOSCOPY WITH PROPOFOL;  Surgeon: FDanie Binder MD;  Location: [facility];  Service: Endoscopy;  Laterality: N/A;  1:30pm  . ERCP N/A 09/22/2018   Procedure: ENDOSCOPIC RETROGRADE CHOLANGIOPANCREATOGRAPHY (ERCP);  Surgeon: RRogene Houston MD;  Location: [facility];  Service: Endoscopy;  Laterality: N/A;  . FOREIGN BODY REMOVAL N/A 08/29/2017   from lip  . KNEE SURGERY Right 1989   bone spur  . MASTECTOMY  2017   bil mastectomies  . SINUSOTOMY       SOCIAL HISTORY:  Social History   Socioeconomic History  . Marital status: Single    Spouse name: Not on file  .  Number of children: 1  . Years of education: 10  . Highest education level: Not on file  Occupational History  . Occupation: disabled  Social Needs  . Financial resource strain: Somewhat hard  . Food insecurity    Worry: Sometimes true    Inability: Sometimes true  . Transportation needs    Medical: No    Non-medical: No  Tobacco Use  . Smoking status: Never Smoker  . Smokeless tobacco: Never Used  Substance and Sexual Activity  . Alcohol use: No  . Drug use: No  . Sexual activity: Not  Currently  Lifestyle  . Physical activity    Days per week: 0 days    Minutes per session: 0 min  . Stress: Rather much  Relationships  . Social Herbalist on phone: Once a week    Gets together: Once a week    Attends religious service: 1 to 4 times per year    Active member of club or organization: Yes    Attends meetings of clubs or organizations: Never    Relationship status: Never married  . Intimate partner violence    Fear of current or ex partner: No    Emotionally abused: No    Physically abused: No    Forced sexual activity: No  Other Topics Concern  . Not on file  Social History Narrative   Bachelors degree   Accounting   Lives with daughter [name] who has autism and diabetes   Likes to sew, quilt, crafts, crochet    FAMILY HISTORY:  Family History  Problem Relation Age of Onset  . Arthritis Mother   . COPD Mother   . Depression Mother   . Diabetes Mother   . Kidney disease Father   . Heart disease Father 55  . Drug abuse Father   . Hypertension Father   . Diabetes Daughter   . Hashimoto's thyroiditis Daughter   . Irritable bowel syndrome Daughter   . Autism spectrum disorder Daughter   . Early death Maternal Grandmother        drowned  . Early death Maternal Grandfather   . Heart disease Maternal Grandfather   . Heart disease Paternal Grandfather   . Breast cancer Maternal Aunt   . Breast cancer Cousin   . Lung cancer Maternal Aunt   . Lung cancer Maternal Uncle   . Colon cancer Neg Hx     CURRENT MEDICATIONS:  Outpatient Encounter Medications as of 11/26/2018  Medication Sig  . antiseptic oral rinse (BIOTENE) LIQD 15 mLs by Mouth Rinse route as needed for dry mouth.  . Cholecalciferol (VITAMIN D3 GUMMIES) 25 MCG (1000 UT) CHEW Chew 2,000-4,000 Units by mouth daily.  Marland Kitchen docusate sodium (COLACE) 100 MG capsule Take 1 capsule (100 mg total) by mouth 2 (two) times daily as needed for mild constipation.  Marland Kitchen escitalopram (LEXAPRO) 20 MG  tablet Take 1 tablet (20 mg total) by mouth daily.  . magic mouthwash w/lidocaine SOLN Take 5 mLs by mouth 4 (four) times daily as needed for mouth pain.  . magnesium oxide (MAG-OX) 400 MG tablet TAKE 1 TABLET BY MOUTH EVERY DAY  . naproxen sodium (ALEVE) 220 MG tablet Take 220-440 mg by mouth 2 (two) times daily as needed (for pain or headache).   . nystatin (MYCOSTATIN) 100000 UNIT/ML suspension Take 5 mLs (500,000 Units total) by mouth 4 (four) times daily. Swish and gargle - then either spit or swallow  . ondansetron (ZOFRAN ODT) 8  MG disintegrating tablet Take 1 tablet (8 mg total) by mouth every 8 (eight) hours as needed for nausea or vomiting.  Marland Kitchen oxyCODONE-acetaminophen (PERCOCET) 5-325 MG tablet Take 1 tablet by mouth every 4 (four) hours as needed for moderate pain.  Marland Kitchen oxyCODONE-acetaminophen (PERCOCET) 5-325 MG tablet Take 1 tablet by mouth every 4 (four) hours as needed for moderate pain.  Marland Kitchen PACLitaxel (TAXOL IV) Inject into the vein once a week.   . pantoprazole (PROTONIX) 40 MG tablet TAKE 1 TABLET BY MOUTH EVERY DAY BEFORE BREAKFAST  . Polyvinyl Alcohol-Povidone PF (REFRESH) 1.4-0.6 % SOLN Place 1 drop into both eyes as needed (for dry eyes).   . prochlorperazine (COMPAZINE) 10 MG tablet Take 1 tablet (10 mg total) by mouth every 6 (six) hours as needed for nausea or vomiting.  . promethazine (PHENERGAN) 25 MG tablet Take 1 tablet (25 mg total) by mouth every 6 (six) hours as needed for nausea or vomiting.  . rizatriptan (MAXALT-MLT) 10 MG disintegrating tablet Take 10 mg by mouth every 2 (two) hours as needed for migraine.   . traMADol (ULTRAM) 50 MG tablet Take 1 tablet (50 mg total) by mouth 2 (two) times daily. (Patient taking differently: Take 50 mg by mouth as needed. )  . zolpidem (AMBIEN) 10 MG tablet Take 1 tablet (10 mg total) by mouth at bedtime as needed for up to 30 days for sleep.   Facility-Administered Encounter Medications as of 11/26/2018  Medication  . 0.9 %  sodium  chloride infusion    ALLERGIES:  Allergies  Allergen Reactions  . Corn-Containing Products Other (See Comments)    Headache and GI upset  . Salagen [Pilocarpine] Other (See Comments)    Can cause liver failure   . Tape Itching and Other (See Comments)    Depending on the adhesive-blistering occurs  . Amoxicillin Hives and Other (See Comments)    Rash only DID THE REACTION INVOLVE: Swelling of the face/tongue/throat, SOB, or low BP? Sudden or severe rash/hives, skin peeling, or the inside of the mouth or nose?  Did it require medical treatment?  When did it last happen? If all above answers are "NO", may proceed with cephalosporin use.   . Caffeine Diarrhea, Nausea Only, Palpitations and Other (See Comments)    Headache  . Tetanus Toxoids Swelling and Other (See Comments)    Local reaction     PHYSICAL EXAM:  ECOG Performance status: 1  Vitals:   11/26/18 0827  BP: (!) 110/58  Pulse: 94  Resp: 18  Temp: 98.9 F (37.2 C)  SpO2: 100%   Filed Weights   11/26/18 0827  Weight: 174 lb 12.8 oz (79.3 kg)    Physical Exam Vitals signs reviewed.  Constitutional:      Appearance: Normal appearance.  Cardiovascular:     Rate and Rhythm: Normal rate and regular rhythm.     Heart sounds: Normal heart sounds.  Pulmonary:     Effort: Pulmonary effort is normal.     Breath sounds: Normal breath sounds.  Abdominal:     General: There is no distension.     Palpations: Abdomen is soft. There is no mass.  Musculoskeletal:        General: No swelling.  Skin:    General: Skin is warm.  Neurological:     General: No focal deficit present.     Mental Status: She is alert and oriented to person, place, and time.  Psychiatric:        Mood  and Affect: Mood normal.        Behavior: Behavior normal.      LABORATORY DATA:  I have reviewed the labs as listed.  CBC    Component Value Date/Time   WBC 3.5 (L) 11/26/2018 0816   RBC 3.35 (L) 11/26/2018 0816   HGB 9.6 (L)  11/26/2018 0816   HCT 29.6 (L) 11/26/2018 0816   PLT 159 11/26/2018 0816   MCV 88.4 11/26/2018 0816   MCH 28.7 11/26/2018 0816   MCHC 32.4 11/26/2018 0816   RDW 15.3 11/26/2018 0816   LYMPHSABS 1.2 11/26/2018 0816   MONOABS 1.0 11/26/2018 0816   EOSABS 0.0 11/26/2018 0816   BASOSABS 0.0 11/26/2018 0816   CMP Latest Ref Rng & Units 11/26/2018 11/12/2018 11/06/2018  Glucose 70 - 99 mg/dL 125(H) 115(H) 126(H)  BUN 6 - 20 mg/dL '6 9 8  ' Creatinine 0.44 - 1.00 mg/dL 0.62 0.54 0.60  Sodium 135 - 145 mmol/L 135 137 135  Potassium 3.5 - 5.1 mmol/L 2.7(LL) 3.2(L) 3.3(L)  Chloride 98 - 111 mmol/L 99 100 99  CO2 22 - 32 mmol/L '28 27 27  ' Calcium 8.9 - 10.3 mg/dL 8.5(L) 8.6(L) 8.6(L)  Total Protein 6.5 - 8.1 g/dL 5.8(L) 6.0(L) 6.8  Total Bilirubin 0.3 - 1.2 mg/dL 1.2 0.9 1.7(H)  Alkaline Phos 38 - 126 U/L 168(H) 169(H) 175(H)  AST 15 - 41 U/L 45(H) 55(H) 43(H)  ALT 0 - 44 U/L 23 33 32       DIAGNOSTIC IMAGING:  I have independently reviewed the scans and discussed with the patient.   I have reviewed Venita Lick LPN's note and agree with the documentation.  I personally performed a face-to-face visit, made revisions and my assessment and plan is as follows.    ASSESSMENT & PLAN:   Malignant neoplasm of central portion of left breast in female, estrogen receptor positive ([place]) 1.  Metastatic breast cancer to the liver: - Left breast cancer diagnosed in January 2017, treated with chemotherapy with dose dense AC followed by Taxol, lumpectomy. - CT CAP on 06/17/2018 shows very large hepatic mass measuring 22 cm, mildly enlarged left internal mammary node, borderline enlarged left lower leg node. -Biopsy of the liver mass on 06/26/2018 shows breast cancer, ER/PR positive and HER-2 negative. -PET CT scan on 07/13/2018 shows very large centrally necrotic mass involving the right and left hepatic lobes, and other hypermetabolic lesion in the left lobe.  Multiple hypermetabolic lymph nodes in the upper  abdomen, left supraclavicular space.  MRI of the T-spine on 08/31/2018 shows possible small vertebral body metastasis at T5 and T8. - Abemaciclib and Faslodex from 07/02/2018 through 09/07/2018 discontinued for progression. - ERCP and plastic stent in the hepatic duct on 09/22/2018 by Dr.Rehman - Paclitaxel 60 mg/m2 3 weeks on 1 week off started on 09/18/2018. -She could not tolerate solid when it was added to the current regimen. -I have increased her Taxol 80 mg per metered square on 11/12/2018. -She tolerated very well without any major problems. - Her ANC is 1155 today.  Hence we will hold her treatment today. -She has severe hypokalemia with potassium of 2.7.  She will receive IV and p.o. potassium today. -We will reevaluate her next week.  She would like to have a port placed.  Will make referral to Dr. Arnoldo Morale.  2.  Constipation: -She could not tolerate lactulose as she could not swallow it. -She was taking stool softener and MiraLAX as needed.  However for the last few  days she had diarrhea.    3.  Nutrition: -She is eating 3 meals a day and is snacking.  Her weight actually improved.    Time spent is 25 minutes with more than 50% of the time spent face-to-face discussing treatment plan and coordination of care.  Orders placed this encounter:  No orders of the defined types were placed in this encounter.     Derek Jack, MD [place] [phone]

## 2018-11-26 NOTE — Progress Notes (Signed)
Pt presents today for appointment and treatment. VSS. MAR reviewed. Pt has no complaints of any changes since the last visit. Pt complains of thrush and rates her pain a 2.

## 2018-11-26 NOTE — Progress Notes (Signed)
71meq of K+ given today per MD orders. Tolerated infusion without adverse affects. Vital signs stable. No complaints at this time. Discharged from clinic ambulatory. F/U with [facility] as scheduled.

## 2018-11-30 ENCOUNTER — Other Ambulatory Visit (HOSPITAL_COMMUNITY): Payer: Self-pay | Admitting: Hematology

## 2018-12-03 ENCOUNTER — Encounter (HOSPITAL_COMMUNITY): Payer: Self-pay

## 2018-12-03 ENCOUNTER — Inpatient Hospital Stay (HOSPITAL_COMMUNITY): Payer: 59

## 2018-12-03 ENCOUNTER — Encounter (HOSPITAL_COMMUNITY): Payer: Self-pay | Admitting: Hematology

## 2018-12-03 ENCOUNTER — Other Ambulatory Visit: Payer: Self-pay

## 2018-12-03 ENCOUNTER — Inpatient Hospital Stay (HOSPITAL_BASED_OUTPATIENT_CLINIC_OR_DEPARTMENT_OTHER): Payer: 59 | Admitting: Hematology

## 2018-12-03 VITALS — BP 105/68 | HR 99 | Temp 98.4°F | Resp 18

## 2018-12-03 DIAGNOSIS — C50919 Malignant neoplasm of unspecified site of unspecified female breast: Secondary | ICD-10-CM

## 2018-12-03 DIAGNOSIS — Z170 Estrogen receptor positive status [ER+]: Secondary | ICD-10-CM | POA: Diagnosis not present

## 2018-12-03 DIAGNOSIS — G4700 Insomnia, unspecified: Secondary | ICD-10-CM

## 2018-12-03 DIAGNOSIS — C50912 Malignant neoplasm of unspecified site of left female breast: Secondary | ICD-10-CM | POA: Diagnosis not present

## 2018-12-03 DIAGNOSIS — Z5111 Encounter for antineoplastic chemotherapy: Secondary | ICD-10-CM | POA: Diagnosis not present

## 2018-12-03 DIAGNOSIS — E876 Hypokalemia: Secondary | ICD-10-CM

## 2018-12-03 DIAGNOSIS — N3281 Overactive bladder: Secondary | ICD-10-CM

## 2018-12-03 DIAGNOSIS — R42 Dizziness and giddiness: Secondary | ICD-10-CM

## 2018-12-03 DIAGNOSIS — F329 Major depressive disorder, single episode, unspecified: Secondary | ICD-10-CM

## 2018-12-03 DIAGNOSIS — C50112 Malignant neoplasm of central portion of left female breast: Secondary | ICD-10-CM

## 2018-12-03 DIAGNOSIS — K5900 Constipation, unspecified: Secondary | ICD-10-CM

## 2018-12-03 DIAGNOSIS — J45909 Unspecified asthma, uncomplicated: Secondary | ICD-10-CM

## 2018-12-03 DIAGNOSIS — Z79899 Other long term (current) drug therapy: Secondary | ICD-10-CM

## 2018-12-03 DIAGNOSIS — K219 Gastro-esophageal reflux disease without esophagitis: Secondary | ICD-10-CM

## 2018-12-03 DIAGNOSIS — Z801 Family history of malignant neoplasm of trachea, bronchus and lung: Secondary | ICD-10-CM

## 2018-12-03 DIAGNOSIS — Z803 Family history of malignant neoplasm of breast: Secondary | ICD-10-CM

## 2018-12-03 DIAGNOSIS — R0602 Shortness of breath: Secondary | ICD-10-CM

## 2018-12-03 LAB — CBC WITH DIFFERENTIAL/PLATELET
Abs Immature Granulocytes: 0.02 10*3/uL (ref 0.00–0.07)
Basophils Absolute: 0.1 10*3/uL (ref 0.0–0.1)
Basophils Relative: 1 %
Eosinophils Absolute: 0 10*3/uL (ref 0.0–0.5)
Eosinophils Relative: 0 %
HCT: 30.4 % — ABNORMAL LOW (ref 36.0–46.0)
Hemoglobin: 9.5 g/dL — ABNORMAL LOW (ref 12.0–15.0)
Immature Granulocytes: 0 %
Lymphocytes Relative: 15 %
Lymphs Abs: 1.5 10*3/uL (ref 0.7–4.0)
MCH: 27.7 pg (ref 26.0–34.0)
MCHC: 31.3 g/dL (ref 30.0–36.0)
MCV: 88.6 fL (ref 80.0–100.0)
Monocytes Absolute: 1 10*3/uL (ref 0.1–1.0)
Monocytes Relative: 10 %
Neutro Abs: 7.4 10*3/uL (ref 1.7–7.7)
Neutrophils Relative %: 74 %
Platelets: 251 10*3/uL (ref 150–400)
RBC: 3.43 MIL/uL — ABNORMAL LOW (ref 3.87–5.11)
RDW: 16.3 % — ABNORMAL HIGH (ref 11.5–15.5)
WBC: 10.1 10*3/uL (ref 4.0–10.5)
nRBC: 0 % (ref 0.0–0.2)

## 2018-12-03 LAB — COMPREHENSIVE METABOLIC PANEL
ALT: 25 U/L (ref 0–44)
AST: 51 U/L — ABNORMAL HIGH (ref 15–41)
Albumin: 2.4 g/dL — ABNORMAL LOW (ref 3.5–5.0)
Alkaline Phosphatase: 218 U/L — ABNORMAL HIGH (ref 38–126)
Anion gap: 11 (ref 5–15)
BUN: 7 mg/dL (ref 6–20)
CO2: 26 mmol/L (ref 22–32)
Calcium: 8.4 mg/dL — ABNORMAL LOW (ref 8.9–10.3)
Chloride: 101 mmol/L (ref 98–111)
Creatinine, Ser: 0.65 mg/dL (ref 0.44–1.00)
GFR calc Af Amer: >60 mL/min (ref >60)
GFR calc non Af Amer: >60 mL/min (ref >60)
Glucose, Bld: 141 mg/dL — ABNORMAL HIGH (ref 70–99)
Potassium: 3 mmol/L — ABNORMAL LOW (ref 3.5–5.1)
Sodium: 138 mmol/L (ref 135–145)
Total Bilirubin: 1 mg/dL (ref 0.3–1.2)
Total Protein: 6 g/dL — ABNORMAL LOW (ref 6.5–8.1)

## 2018-12-03 MED ORDER — SODIUM CHLORIDE 0.9 % IV SOLN
Freq: Once | INTRAVENOUS | Status: AC
  Administered 2018-12-03: 09:00:00 via INTRAVENOUS

## 2018-12-03 MED ORDER — SODIUM CHLORIDE 0.9 % IV SOLN
80.0000 mg/m2 | Freq: Once | INTRAVENOUS | Status: AC
  Administered 2018-12-03: 162 mg via INTRAVENOUS
  Filled 2018-12-03: qty 27

## 2018-12-03 MED ORDER — HEPARIN SOD (PORK) LOCK FLUSH 100 UNIT/ML IV SOLN
500.0000 [IU] | Freq: Once | INTRAVENOUS | Status: AC | PRN
  Administered 2018-12-03: 500 [IU]

## 2018-12-03 MED ORDER — TEMAZEPAM 15 MG PO CAPS: 15.0000 mg | ORAL_CAPSULE | Freq: Every evening | ORAL | 0 refills | Status: DC | PRN

## 2018-12-03 MED ORDER — SODIUM CHLORIDE 0.9 % IV SOLN
10.0000 mg | Freq: Once | INTRAVENOUS | Status: AC
  Administered 2018-12-03: 10 mg via INTRAVENOUS
  Filled 2018-12-03: qty 10

## 2018-12-03 MED ORDER — FAMOTIDINE IN NACL 20-0.9 MG/50ML-% IV SOLN
20.0000 mg | Freq: Once | INTRAVENOUS | Status: AC
  Administered 2018-12-03: 20 mg via INTRAVENOUS
  Filled 2018-12-03: qty 50

## 2018-12-03 MED ORDER — PALONOSETRON HCL INJECTION 0.25 MG/5ML
0.2500 mg | Freq: Once | INTRAVENOUS | Status: AC
  Administered 2018-12-03: 0.25 mg via INTRAVENOUS
  Filled 2018-12-03: qty 5

## 2018-12-03 MED ORDER — POTASSIUM CHLORIDE ER 10 MEQ PO TBCR: 20.0000 meq | EXTENDED_RELEASE_TABLET | Freq: Every day | ORAL | 3 refills | Status: DC

## 2018-12-03 MED ORDER — SODIUM CHLORIDE 0.9% FLUSH
10.0000 mL | INTRAVENOUS | Status: DC | PRN
  Administered 2018-12-03: 10 mL
  Filled 2018-12-03: qty 10

## 2018-12-03 MED ORDER — DIPHENHYDRAMINE HCL 50 MG/ML IJ SOLN
25.0000 mg | Freq: Once | INTRAMUSCULAR | Status: AC
  Administered 2018-12-03: 25 mg via INTRAVENOUS
  Filled 2018-12-03: qty 1

## 2018-12-03 MED ORDER — POTASSIUM CHLORIDE CRYS ER 20 MEQ PO TBCR
40.0000 meq | EXTENDED_RELEASE_TABLET | Freq: Once | ORAL | Status: AC
  Administered 2018-12-03: 40 meq via ORAL
  Filled 2018-12-03: qty 2

## 2018-12-03 NOTE — Patient Instructions (Addendum)
[facility] at [facility] Discharge Instructions  You were seen today by Dr. Katragadda. He went over your recent lab results. He will see you back in 1 week for labs and follow up.   Thank you for choosing [facility] at [facility] to provide your oncology and hematology care.  To afford each patient quality time with our provider, please arrive at least 15 minutes before your scheduled appointment time.   If you have a lab appointment with the Cancer Center please come in thru the  Main Entrance and check in at the main information desk  You need to re-schedule your appointment should you arrive 10 or more minutes late.  We strive to give you quality time with our providers, and arriving late affects you and other patients whose appointments are after yours.  Also, if you no show three or more times for appointments you may be dismissed from the clinic at the providers discretion.     Again, thank you for choosing [facility].  Our hope is that these requests will decrease the amount of time that you wait before being seen by our physicians.       _____________________________________________________________  Should you have questions after your visit to  Cancer Center, please contact our office at [phone] between the hours of 8:00 a.m. and 4:30 p.m.  Voicemails left after 4:00 p.m. will not be returned until the following business day.  For prescription refill requests, have your pharmacy contact our office and allow 72 hours.    Cancer Center Support Programs:   > Cancer Support Group  2nd Tuesday of the month 1pm-2pm, Journey Room    

## 2018-12-03 NOTE — Progress Notes (Signed)
[address] [place], [place] [address]   CLINIC:  Medical Oncology/Hematology  PCP:  Doree Albee, MD [place] [phone]   REASON FOR VISIT:  Follow-up for metastatic breast cancer.   BRIEF ONCOLOGIC HISTORY:  Oncology History  Metastatic breast cancer ([place])  07/02/2018 Initial Diagnosis   Metastatic breast cancer ([place])   09/18/2018 -  Chemotherapy   The patient had palonosetron (ALOXI) injection 0.25 mg, 0.25 mg, Intravenous,  Once, 3 of 4 cycles Administration: 0.25 mg (10/01/2018), 0.25 mg (10/09/2018), 0.25 mg (10/23/2018), 0.25 mg (10/30/2018), 0.25 mg (11/12/2018), 0.25 mg (12/03/2018) PACLitaxel (TAXOL) 120 mg in sodium chloride 0.9 % 250 mL chemo infusion (</= 54m/m2), 60 mg/m2 = 120 mg (100 % of original dose 60 mg/m2), Intravenous,  Once, 3 of 4 cycles Dose modification: 60 mg/m2 (original dose 60 mg/m2, Cycle 1, Reason: Change in LFTs), 80 mg/m2 (original dose 60 mg/m2, Cycle 2, Reason: Provider Judgment) Administration: 120 mg (09/18/2018), 120 mg (10/01/2018), 120 mg (10/23/2018), 120 mg (10/09/2018), 120 mg (10/30/2018), 162 mg (11/12/2018), 162 mg (12/03/2018)  for chemotherapy treatment.       CANCER STAGING: Cancer Staging No matching staging information was found for the patient.   INTERVAL HISTORY:  Ms. NMoshier89y.o. female seen for follow-up of her metastatic breast cancer and next cycle of chemotherapy.  Her chemotherapy was held last week secondary to neutropenia.  She is having difficulty with sleep.  Ambien is no longer working.  She also noticed some burning sensation in the feet for the last couple of days.  She is taking magnesium supplements daily.  Denies any abdominal pains.  Eating well and maintaining her weight.  Zofran does not always help with her nausea.  Appetite and energy levels are 50%.  REVIEW OF SYSTEMS:  Review of Systems  Constitutional: Negative for fatigue.  Respiratory: Positive  for shortness of breath.   Gastrointestinal: Positive for nausea. Negative for diarrhea.  Neurological: Positive for dizziness and numbness.  Psychiatric/Behavioral: Positive for sleep disturbance.  All other systems reviewed and are negative.    PAST MEDICAL/SURGICAL HISTORY:  Past Medical History:  Diagnosis Date  . Anemia   . Anxiety   . Asthma   . Breast cancer ([facility]    Left, 2017  . Depression   . GERD (gastroesophageal reflux disease)   . Migraine   . OAB (overactive bladder)   . Seasonal allergies    Past Surgical History:  Procedure Laterality Date  . ABDOMINAL HYSTERECTOMY     breast cancer  . ANKLE RECONSTRUCTION Right 1989  . BILIARY STENT PLACEMENT N/A 09/22/2018   Procedure: BILIARY STENT PLACEMENT;  Surgeon: RRogene Houston MD;  [facility];  Service: Endoscopy;  Laterality: N/A;  . BREAST SURGERY    . COLONOSCOPY WITH PROPOFOL N/A 08/06/2018   Procedure: COLONOSCOPY WITH PROPOFOL;  Surgeon: FDanie Binder MD;  Location: [facility];  Service: Endoscopy;  Laterality: N/A;  1:30pm  . ERCP N/A 09/22/2018   Procedure: ENDOSCOPIC RETROGRADE CHOLANGIOPANCREATOGRAPHY (ERCP);  Surgeon: RRogene Houston MD;  Location: [facility];  Service: Endoscopy;  Laterality: N/A;  . FOREIGN BODY REMOVAL N/A 08/29/2017   from lip  . KNEE SURGERY Right 1989   bone spur  . MASTECTOMY  2017   bil mastectomies  . SINUSOTOMY       SOCIAL HISTORY:  Social History   Socioeconomic History  . Marital status: Single  Spouse name: Not on file  . Number of children: 1  . Years of education: 96  . Highest education level: Not on file  Occupational History  . Occupation: disabled  Social Needs  . Financial resource strain: Somewhat hard  . Food insecurity    Worry: Sometimes true    Inability: Sometimes true  . Transportation needs    Medical: No    Non-medical: No  Tobacco Use  . Smoking status: Never Smoker  . Smokeless tobacco: Never Used  Substance  and Sexual Activity  . Alcohol use: No  . Drug use: No  . Sexual activity: Not Currently  Lifestyle  . Physical activity    Days per week: 0 days    Minutes per session: 0 min  . Stress: Rather much  Relationships  . Social Herbalist on phone: Once a week    Gets together: Once a week    Attends religious service: 1 to 4 times per year    Active member of club or organization: Yes    Attends meetings of clubs or organizations: Never    Relationship status: Never married  . Intimate partner violence    Fear of current or ex partner: No    Emotionally abused: No    Physically abused: No    Forced sexual activity: No  Other Topics Concern  . Not on file  Social History Narrative   Bachelors degree   Accounting   Lives with daughter [name] who has autism and diabetes   Likes to sew, quilt, crafts, crochet    FAMILY HISTORY:  Family History  Problem Relation Age of Onset  . Arthritis Mother   . COPD Mother   . Depression Mother   . Diabetes Mother   . Kidney disease Father   . Heart disease Father 54  . Drug abuse Father   . Hypertension Father   . Diabetes Daughter   . Hashimoto's thyroiditis Daughter   . Irritable bowel syndrome Daughter   . Autism spectrum disorder Daughter   . Early death Maternal Grandmother        drowned  . Early death Maternal Grandfather   . Heart disease Maternal Grandfather   . Heart disease Paternal Grandfather   . Breast cancer Maternal Aunt   . Breast cancer Cousin   . Lung cancer Maternal Aunt   . Lung cancer Maternal Uncle   . Colon cancer Neg Hx     CURRENT MEDICATIONS:  Outpatient Encounter Medications as of 12/03/2018  Medication Sig Note  . antiseptic oral rinse (BIOTENE) LIQD 15 mLs by Mouth Rinse route as needed for dry mouth.   Marland Kitchen b complex vitamins capsule Take 1 capsule by mouth daily.   . Cholecalciferol (VITAMIN D3 GUMMIES) 25 MCG (1000 UT) CHEW Chew 2,000-4,000 Units by mouth daily.   Marland Kitchen docusate sodium  (COLACE) 100 MG capsule Take 1 capsule (100 mg total) by mouth 2 (two) times daily as needed for mild constipation.   Marland Kitchen escitalopram (LEXAPRO) 20 MG tablet Take 1 tablet (20 mg total) by mouth daily.   Marland Kitchen Lysine 1000 MG TABS Take 1,000 mg by mouth daily. 12/03/2018: Pt hasn't started taking yet  . magic mouthwash w/lidocaine SOLN Take 5 mLs by mouth 4 (four) times daily as needed for mouth pain.   . magnesium oxide (MAG-OX) 400 MG tablet TAKE 1 TABLET BY MOUTH EVERY DAY   . naproxen sodium (ALEVE) 220 MG tablet Take 220-440 mg by mouth  2 (two) times daily as needed (for pain or headache).    . ondansetron (ZOFRAN-ODT) 8 MG disintegrating tablet TAKE 1 TABLET (8 MG TOTAL) BY MOUTH EVERY 8 (EIGHT) HOURS AS NEEDED FOR NAUSEA OR VOMITING.   Marland Kitchen oxyCODONE-acetaminophen (PERCOCET) 5-325 MG tablet Take 1 tablet by mouth every 4 (four) hours as needed for moderate pain.   Marland Kitchen oxyCODONE-acetaminophen (PERCOCET) 5-325 MG tablet Take 1 tablet by mouth every 4 (four) hours as needed for moderate pain.   Marland Kitchen PACLitaxel (TAXOL IV) Inject into the vein once a week.    . pantoprazole (PROTONIX) 40 MG tablet TAKE 1 TABLET BY MOUTH EVERY DAY BEFORE BREAKFAST   . Polyvinyl Alcohol-Povidone PF (REFRESH) 1.4-0.6 % SOLN Place 1 drop into both eyes as needed (for dry eyes).    . prochlorperazine (COMPAZINE) 10 MG tablet Take 1 tablet (10 mg total) by mouth every 6 (six) hours as needed for nausea or vomiting.   . promethazine (PHENERGAN) 25 MG tablet Take 1 tablet (25 mg total) by mouth every 6 (six) hours as needed for nausea or vomiting.   . rizatriptan (MAXALT-MLT) 10 MG disintegrating tablet Take 10 mg by mouth every 2 (two) hours as needed for migraine.    . traMADol (ULTRAM) 50 MG tablet Take 1 tablet (50 mg total) by mouth 2 (two) times daily. (Patient taking differently: Take 50 mg by mouth as needed. )   . nystatin (MYCOSTATIN) 100000 UNIT/ML suspension Take 5 mLs (500,000 Units total) by mouth 4 (four) times daily. Swish  and gargle - then either spit or swallow (Patient not taking: Reported on 12/03/2018)   . potassium chloride (K-DUR) 10 MEQ tablet Take 2 tablets (20 mEq total) by mouth daily.   . temazepam (RESTORIL) 15 MG capsule Take 1 capsule (15 mg total) by mouth at bedtime as needed for sleep.   Marland Kitchen zolpidem (AMBIEN) 10 MG tablet Take 1 tablet (10 mg total) by mouth at bedtime as needed for up to 30 days for sleep.    No facility-administered encounter medications on file as of 12/03/2018.     ALLERGIES:  Allergies  Allergen Reactions  . Corn-Containing Products Other (See Comments)    Headache and GI upset  . Salagen [Pilocarpine] Other (See Comments)    Can cause liver failure   . Tape Itching and Other (See Comments)    Depending on the adhesive-blistering occurs  . Amoxicillin Hives and Other (See Comments)    Rash only DID THE REACTION INVOLVE: Swelling of the face/tongue/throat, SOB, or low BP? Sudden or severe rash/hives, skin peeling, or the inside of the mouth or nose?  Did it require medical treatment?  When did it last happen? If all above answers are "NO", may proceed with cephalosporin use.   . Caffeine Diarrhea, Nausea Only, Palpitations and Other (See Comments)    Headache  . Tetanus Toxoids Swelling and Other (See Comments)    Local reaction     PHYSICAL EXAM:  ECOG Performance status: 1  I have reviewed her vitals.  Blood pressure is 122/74.  Pulse rate is 99.  Respirate is 18.  Temperature 98. Physical Exam Vitals signs reviewed.  Constitutional:      Appearance: Normal appearance.  Cardiovascular:     Rate and Rhythm: Normal rate and regular rhythm.     Heart sounds: Normal heart sounds.  Pulmonary:     Effort: Pulmonary effort is normal.     Breath sounds: Normal breath sounds.  Abdominal:  General: There is no distension.     Palpations: Abdomen is soft. There is no mass.  Musculoskeletal:        General: No swelling.  Skin:    General: Skin is  warm.  Neurological:     General: No focal deficit present.     Mental Status: She is alert and oriented to person, place, and time.  Psychiatric:        Mood and Affect: Mood normal.        Behavior: Behavior normal.      LABORATORY DATA:  I have reviewed the labs as listed.  CBC    Component Value Date/Time   WBC 10.1 12/03/2018 0754   RBC 3.43 (L) 12/03/2018 0754   HGB 9.5 (L) 12/03/2018 0754   HCT 30.4 (L) 12/03/2018 0754   PLT 251 12/03/2018 0754   MCV 88.6 12/03/2018 0754   MCH 27.7 12/03/2018 0754   MCHC 31.3 12/03/2018 0754   RDW 16.3 (H) 12/03/2018 0754   LYMPHSABS 1.5 12/03/2018 0754   MONOABS 1.0 12/03/2018 0754   EOSABS 0.0 12/03/2018 0754   BASOSABS 0.1 12/03/2018 0754   CMP Latest Ref Rng & Units 12/03/2018 11/26/2018 11/12/2018  Glucose 70 - 99 mg/dL 141(H) 125(H) 115(H)  BUN 6 - 20 mg/dL _0 Creatinine 0.44 - 1.00 mg/dL 0.65 0.62 0.54  Sodium 135 - 145 mmol/L 138 135 137  Potassium 3.5 - 5.1 mmol/L 3.0(L) 2.7(LL) 3.2(L)  Chloride 98 - 111 mmol/L 101 99 100  CO2 22 - 32 mmol/L _1 Calcium 8.9 - 10.3 mg/dL 8.4(L) 8.5(L) 8.6(L)  Total Protein 6.5 - 8.1 g/dL 6.0(L) 5.8(L) 6.0(L)  Total Bilirubin 0.3 - 1.2 mg/dL 1.0 1.2 0.9  Alkaline Phos 38 - 126 U/L 218(H) 168(H) 169(H)  AST 15 - 41 U/L 51(H) 45(H) 55(H)  ALT 0 - 44 U/L 25 23 33       DIAGNOSTIC IMAGING:  I have independently reviewed the scans and discussed with the patient.   I have reviewed Venita Lick LPN's note and agree with the documentation.  I personally performed a face-to-face visit, made revisions and my assessment and plan is as follows.    ASSESSMENT & PLAN:   Malignant neoplasm of central portion of left breast in female, estrogen receptor positive ([place]) 1.  Metastatic breast cancer to the liver: - Left breast cancer diagnosed in January 2017, treated with chemotherapy with dose dense AC followed by Taxol, lumpectomy. - CT CAP on 06/17/2018 shows very large hepatic mass  measuring 22 cm, mildly enlarged left internal mammary node, borderline enlarged left lower leg node. -Biopsy of the liver mass on 06/26/2018 shows breast cancer, ER/PR positive and HER-2 negative. -PET CT scan on 07/13/2018 shows very large centrally necrotic mass involving the right and left hepatic lobes, and other hypermetabolic lesion in the left lobe.  Multiple hypermetabolic lymph nodes in the upper abdomen, left supraclavicular space.  MRI of the T-spine on 08/31/2018 shows possible small vertebral body metastasis at T5 and T8. - Abemaciclib and Faslodex from 07/02/2018 through 09/07/2018 discontinued for progression. - ERCP and plastic stent in the hepatic duct on 09/22/2018 by Dr.Rehman - Paclitaxel 60 mg/m2 3 weeks on 1 week off started on 09/18/2018. -She could not tolerate solid when it was added to the current regimen. -Taxol dose increased to 80 mg per metered square on 11/12/2018. -Chemotherapy was held last week secondary to decreased [place]. - She has noticed slight burning in the  feet for the last 3 to 4 days.  She is seeing Dr. Arnoldo Morale for port placement next Tuesday. - I have reviewed her blood work.  White count is adequate to proceed with next treatment today. -Her potassium is 3 today.  She was given potassium in the clinic.  We will start her on potassium 20 mEq daily. -We will reevaluate her in 1 week.  2.  Constipation: -She could not tolerate lactulose as she could not swallow it. -She was taking stool softener and MiraLAX as needed.  However for the last few days she had diarrhea.    3.  Difficulty with sleep: - She has used Ambien without help. -I will send a prescription for Restoril 15 mg at bedtime.    Time spent is 25 minutes with more than 50% of the time spent face-to-face discussing treatment plan and coordination of care.  Orders placed this encounter:  No orders of the defined types were placed in this encounter.     Derek Jack, MD [place] [phone]

## 2018-12-03 NOTE — Assessment & Plan Note (Signed)
1.  Metastatic breast cancer to the liver: - Left breast cancer diagnosed in January 2017, treated with chemotherapy with dose dense AC followed by Taxol, lumpectomy. - CT CAP on 06/17/2018 shows very large hepatic mass measuring 22 cm, mildly enlarged left internal mammary node, borderline enlarged left lower leg node. -Biopsy of the liver mass on 06/26/2018 shows breast cancer, ER/PR positive and HER-2 negative. -PET CT scan on 07/13/2018 shows very large centrally necrotic mass involving the right and left hepatic lobes, and other hypermetabolic lesion in the left lobe.  Multiple hypermetabolic lymph nodes in the upper abdomen, left supraclavicular space.  MRI of the T-spine on 08/31/2018 shows possible small vertebral body metastasis at T5 and T8. - Abemaciclib and Faslodex from 07/02/2018 through 09/07/2018 discontinued for progression. - ERCP and plastic stent in the hepatic duct on 09/22/2018 by Dr.Rehman - Paclitaxel 60 mg/m2 3 weeks on 1 week off started on 09/18/2018. -She could not tolerate solid when it was added to the current regimen. -Taxol dose increased to 80 mg per metered square on 11/12/2018. -Chemotherapy was held last week secondary to decreased [place]. - She has noticed slight burning in the feet for the last 3 to 4 days.  She is seeing Dr. Arnoldo Morale for port placement next Tuesday. - I have reviewed her blood work.  White count is adequate to proceed with next treatment today. -Her potassium is 3 today.  She was given potassium in the clinic.  We will start her on potassium 20 mEq daily. -We will reevaluate her in 1 week.  2.  Constipation: -She could not tolerate lactulose as she could not swallow it. -She was taking stool softener and MiraLAX as needed.  However for the last few days she had diarrhea.    3.  Difficulty with sleep: - She has used Ambien without help. -I will send a prescription for Restoril 15 mg at bedtime.

## 2018-12-03 NOTE — Progress Notes (Signed)
Pt presents today for PICC flush, labs, treatment with PICC dressing change. VS within parameters for treatment. Pt has no complaints of any changes since the last visit. MAR reviewed.   Message sent to Dr. [name] and Aviva Signs LPN to report temp of 99.5 with labs pending.   Temp recheck by ATravis LPN. Message received to proceed with treatment. Give 49meq of PO K+ x 1 dose now.   Treatment given today per MD orders. Tolerated infusion without adverse affects. Vital signs stable. No complaints at this time. Discharged from clinic ambulatory. F/U with [facility] as scheduled.

## 2018-12-03 NOTE — Progress Notes (Signed)
12/03/18  Received order for Potassium Chloride 40 mEq oral x 1 due to K+ level of 3.  Orders entered as instructed.  T.O. Dr Beckey Downing LPN/Elijahjames Fuelling Ronnald Ramp, PharmD

## 2018-12-06 ENCOUNTER — Other Ambulatory Visit (HOSPITAL_COMMUNITY): Payer: Self-pay | Admitting: Nurse Practitioner

## 2018-12-06 DIAGNOSIS — C50919 Malignant neoplasm of unspecified site of unspecified female breast: Secondary | ICD-10-CM

## 2018-12-08 ENCOUNTER — Encounter: Payer: Self-pay | Admitting: General Surgery

## 2018-12-08 ENCOUNTER — Ambulatory Visit (INDEPENDENT_AMBULATORY_CARE_PROVIDER_SITE_OTHER): Payer: 59 | Admitting: General Surgery

## 2018-12-08 ENCOUNTER — Other Ambulatory Visit: Payer: Self-pay

## 2018-12-08 VITALS — BP 132/81 | HR 125 | Temp 97.7°F | Resp 16 | Ht 65.5 in | Wt 185.0 lb

## 2018-12-08 DIAGNOSIS — C50919 Malignant neoplasm of unspecified site of unspecified female breast: Secondary | ICD-10-CM

## 2018-12-08 NOTE — Patient Instructions (Signed)

## 2018-12-08 NOTE — Progress Notes (Signed)
Pamela Long; [id number]; 03-Nov-1970   HPI Patient is a 48 year old white female who was referred to my care by Dr. [name] for evaluation treatment for Port-A-Cath placement.  Patient has metastatic breast cancer and needs central venous access.  She currently is using a PICC line.  She has had a port on the right side in the past, but it was removed.  She currently has 1 out of 10 body pain.  She denies any fever or chills. Past Medical History:  Diagnosis Date  . Anemia   . Anxiety   . Asthma   . Breast cancer ([place])    Left, 2017  . Depression   . GERD (gastroesophageal reflux disease)   . Migraine   . OAB (overactive bladder)   . Seasonal allergies     Past Surgical History:  Procedure Laterality Date  . ABDOMINAL HYSTERECTOMY     breast cancer  . ANKLE RECONSTRUCTION Right 1989  . BILIARY STENT PLACEMENT N/A 09/22/2018   Procedure: BILIARY STENT PLACEMENT;  Surgeon: Rogene Houston, MD;  [facility];  Service: Endoscopy;  Laterality: N/A;  . BREAST SURGERY    . COLONOSCOPY WITH PROPOFOL N/A 08/06/2018   Procedure: COLONOSCOPY WITH PROPOFOL;  Surgeon: Danie Binder, MD;  [facility];  Service: Endoscopy;  Laterality: N/A;  1:30pm  . ERCP N/A 09/22/2018   Procedure: ENDOSCOPIC RETROGRADE CHOLANGIOPANCREATOGRAPHY (ERCP);  Surgeon: Rogene Houston, MD;  [facility];  Service: Endoscopy;  Laterality: N/A;  . FOREIGN BODY REMOVAL N/A 08/29/2017   from lip  . KNEE SURGERY Right 1989   bone spur  . MASTECTOMY  2017   bil mastectomies  . SINUSOTOMY      Family History  Problem Relation Age of Onset  . Arthritis Mother   . COPD Mother   . Depression Mother   . Diabetes Mother   . Kidney disease Father   . Heart disease Father 69  . Drug abuse Father   . Hypertension Father   . Diabetes Daughter   . Hashimoto's thyroiditis Daughter   . Irritable bowel syndrome Daughter   . Autism spectrum disorder Daughter   . Early death  Maternal Grandmother        drowned  . Early death Maternal Grandfather   . Heart disease Maternal Grandfather   . Heart disease Paternal Grandfather   . Breast cancer Maternal Aunt   . Breast cancer Cousin   . Lung cancer Maternal Aunt   . Lung cancer Maternal Uncle   . Colon cancer Neg Hx     Current Outpatient Medications on File Prior to Visit  Medication Sig Dispense Refill  . antiseptic oral rinse (BIOTENE) LIQD 15 mLs by Mouth Rinse route as needed for dry mouth.    Marland Kitchen b complex vitamins capsule Take 1 capsule by mouth daily.    . Cholecalciferol (VITAMIN D3 GUMMIES) 25 MCG (1000 UT) CHEW Chew 2,000-4,000 Units by mouth daily.    Marland Kitchen docusate sodium (COLACE) 100 MG capsule Take 1 capsule (100 mg total) by mouth 2 (two) times daily as needed for mild constipation. 30 capsule 2  . escitalopram (LEXAPRO) 20 MG tablet Take 1 tablet (20 mg total) by mouth daily. 90 tablet 3  . Lysine 1000 MG TABS Take 1,000 mg by mouth daily.    . magic mouthwash w/lidocaine SOLN Take 5 mLs by mouth 4 (four) times daily as needed for mouth pain. 360 mL 2  . magnesium oxide (  MAG-OX) 400 MG tablet TAKE 1 TABLET BY MOUTH EVERY DAY 30 tablet 0  . naproxen sodium (ALEVE) 220 MG tablet Take 220-440 mg by mouth 2 (two) times daily as needed (for pain or headache).     . nystatin (MYCOSTATIN) 100000 UNIT/ML suspension Take 5 mLs (500,000 Units total) by mouth 4 (four) times daily. Swish and gargle - then either spit or swallow 473 mL 0  . ondansetron (ZOFRAN-ODT) 8 MG disintegrating tablet TAKE 1 TABLET (8 MG TOTAL) BY MOUTH EVERY 8 (EIGHT) HOURS AS NEEDED FOR NAUSEA OR VOMITING. 20 tablet 1  . oxyCODONE-acetaminophen (PERCOCET) 5-325 MG tablet Take 1 tablet by mouth every 4 (four) hours as needed for moderate pain. 6 tablet 0  . oxyCODONE-acetaminophen (PERCOCET) 5-325 MG tablet Take 1 tablet by mouth every 4 (four) hours as needed for moderate pain. 15 tablet 0  . PACLitaxel (TAXOL IV) Inject into the vein once a  week.     . pantoprazole (PROTONIX) 40 MG tablet TAKE 1 TABLET BY MOUTH EVERY DAY BEFORE BREAKFAST 90 tablet 3  . Polyvinyl Alcohol-Povidone PF (REFRESH) 1.4-0.6 % SOLN Place 1 drop into both eyes as needed (for dry eyes).     . potassium chloride (K-DUR) 10 MEQ tablet Take 2 tablets (20 mEq total) by mouth daily. 60 tablet 3  . prochlorperazine (COMPAZINE) 10 MG tablet Take 1 tablet (10 mg total) by mouth every 6 (six) hours as needed for nausea or vomiting. 30 tablet 1  . promethazine (PHENERGAN) 25 MG tablet Take 1 tablet (25 mg total) by mouth every 6 (six) hours as needed for nausea or vomiting. 30 tablet 3  . rizatriptan (MAXALT-MLT) 10 MG disintegrating tablet Take 10 mg by mouth every 2 (two) hours as needed for migraine.     . temazepam (RESTORIL) 15 MG capsule Take 1 capsule (15 mg total) by mouth at bedtime as needed for sleep. 30 capsule 0  . traMADol (ULTRAM) 50 MG tablet Take 1 tablet (50 mg total) by mouth 2 (two) times daily. (Patient taking differently: Take 50 mg by mouth as needed. ) 30 tablet 0  . zolpidem (AMBIEN) 10 MG tablet Take 1 tablet (10 mg total) by mouth at bedtime as needed for up to 30 days for sleep. 10 tablet 0   No current facility-administered medications on file prior to visit.     Allergies  Allergen Reactions  . Corn-Containing Products Other (See Comments)    Headache and GI upset  . Salagen [Pilocarpine] Other (See Comments)    Can cause liver failure   . Tape Itching and Other (See Comments)    Depending on the adhesive-blistering occurs  . Amoxicillin Hives and Other (See Comments)    Rash only DID THE REACTION INVOLVE: Swelling of the face/tongue/throat, SOB, or low BP? Sudden or severe rash/hives, skin peeling, or the inside of the mouth or nose?  Did it require medical treatment?  When did it last happen? If all above answers are "NO", may proceed with cephalosporin use.   . Caffeine Diarrhea, Nausea Only, Palpitations and Other (See  Comments)    Headache  . Tetanus Toxoids Swelling and Other (See Comments)    Local reaction    Social History   Substance and Sexual Activity  Alcohol Use No    Social History   Tobacco Use  Smoking Status Never Smoker  Smokeless Tobacco Never Used    Review of Systems  Constitutional: Positive for malaise/fatigue.  HENT: Positive for sore throat.  Eyes: Positive for blurred vision.  Respiratory: Negative.   Cardiovascular: Negative.   Gastrointestinal: Positive for heartburn.  Genitourinary: Positive for frequency.  Musculoskeletal: Positive for joint pain.  Skin: Negative.   Neurological: Negative.   Endo/Heme/Allergies: Negative.   Psychiatric/Behavioral: Negative.     Objective   Vitals:   12/08/18 1410  BP: 132/81  Pulse: (!) 125  Resp: 16  Temp: 97.7 F (36.5 C)  SpO2: 94%    Physical Exam Vitals signs reviewed.  Constitutional:      Appearance: She is not ill-appearing.  HENT:     Head: Normocephalic and atraumatic.  Cardiovascular:     Rate and Rhythm: Normal rate and regular rhythm.     Heart sounds: Normal heart sounds. No murmur. No friction rub. No gallop.   Pulmonary:     Effort: Pulmonary effort is normal. No respiratory distress.     Breath sounds: Normal breath sounds. No stridor. No wheezing, rhonchi or rales.  Skin:    General: Skin is warm and dry.     Comments: PICC line in place right arm.  Old Port-A-Cath scar right chest.  Neurological:     Mental Status: She is alert and oriented to person, place, and time.    Dr. Tomie China notes reviewed Assessment  Metastatic breast carcinoma, need for central venous access Plan   Patient is scheduled for Port-A-Cath insertion and PICC line removal on 12/23/2018.  The risks and benefits of the procedure including bleeding, infection, and pneumothorax were fully explained to the patient, who gave informed consent.

## 2018-12-09 NOTE — H&P (Signed)
Pamela Long; [id number]; [birth year]   HPI Patient is a 48 year old white female who was referred to my care by Pamela Long for evaluation treatment for Port-A-Cath placement.  Patient has metastatic breast cancer and needs central venous access.  She currently is using a PICC line.  She has had a port on the right side in the past, but it was removed.  She currently has 1 out of 10 body pain.  She denies any fever or chills. Past Medical History:  Diagnosis Date  . Anemia   . Anxiety   . Asthma   . Breast cancer ([place])    Left, 2017  . Depression   . GERD (gastroesophageal reflux disease)   . Migraine   . OAB (overactive bladder)   . Seasonal allergies     Past Surgical History:  Procedure Laterality Date  . ABDOMINAL HYSTERECTOMY     breast cancer  . ANKLE RECONSTRUCTION Right 1989  . BILIARY STENT PLACEMENT N/A 09/22/2018   Procedure: BILIARY STENT PLACEMENT;  Surgeon: Pamela Houston, MD;  [facility];  Service: Endoscopy;  Laterality: N/A;  . BREAST SURGERY    . COLONOSCOPY WITH PROPOFOL N/A 08/06/2018   Procedure: COLONOSCOPY WITH PROPOFOL;  Surgeon: Pamela Binder, MD;  [facility];  Service: Endoscopy;  Laterality: N/A;  1:30pm  . ERCP N/A 09/22/2018   Procedure: ENDOSCOPIC RETROGRADE CHOLANGIOPANCREATOGRAPHY (ERCP);  Surgeon: Pamela Houston, MD;  [facility];  Service: Endoscopy;  Laterality: N/A;  . FOREIGN BODY REMOVAL N/A 08/29/2017   from lip  . KNEE SURGERY Right 1989   bone spur  . MASTECTOMY  2017   bil mastectomies  . SINUSOTOMY      Family History  Problem Relation Age of Onset  . Arthritis Mother   . COPD Mother   . Depression Mother   . Diabetes Mother   . Kidney disease Father   . Heart disease Father 31  . Drug abuse Father   . Hypertension Father   . Diabetes Daughter   . Hashimoto's thyroiditis Daughter   . Irritable bowel syndrome Daughter   . Autism spectrum disorder Daughter   . Early death  Maternal Grandmother        drowned  . Early death Maternal Grandfather   . Heart disease Maternal Grandfather   . Heart disease Paternal Grandfather   . Breast cancer Maternal Aunt   . Breast cancer Cousin   . Lung cancer Maternal Aunt   . Lung cancer Maternal Uncle   . Colon cancer Neg Hx     Current Outpatient Medications on File Prior to Visit  Medication Sig Dispense Refill  . antiseptic oral rinse (BIOTENE) LIQD 15 mLs by Mouth Rinse route as needed for dry mouth.    Marland Kitchen b complex vitamins capsule Take 1 capsule by mouth daily.    . Cholecalciferol (VITAMIN D3 GUMMIES) 25 MCG (1000 UT) CHEW Chew 2,000-4,000 Units by mouth daily.    Marland Kitchen docusate sodium (COLACE) 100 MG capsule Take 1 capsule (100 mg total) by mouth 2 (two) times daily as needed for mild constipation. 30 capsule 2  . escitalopram (LEXAPRO) 20 MG tablet Take 1 tablet (20 mg total) by mouth daily. 90 tablet 3  . Lysine 1000 MG TABS Take 1,000 mg by mouth daily.    . magic mouthwash w/lidocaine SOLN Take 5 mLs by mouth 4 (four) times daily as needed for mouth pain. 360 mL 2  . magnesium oxide (  MAG-OX) 400 MG tablet TAKE 1 TABLET BY MOUTH EVERY DAY 30 tablet 0  . naproxen sodium (ALEVE) 220 MG tablet Take 220-440 mg by mouth 2 (two) times daily as needed (for pain or headache).     . nystatin (MYCOSTATIN) 100000 UNIT/ML suspension Take 5 mLs (500,000 Units total) by mouth 4 (four) times daily. Swish and gargle - then either spit or swallow 473 mL 0  . ondansetron (ZOFRAN-ODT) 8 MG disintegrating tablet TAKE 1 TABLET (8 MG TOTAL) BY MOUTH EVERY 8 (EIGHT) HOURS AS NEEDED FOR NAUSEA OR VOMITING. 20 tablet 1  . oxyCODONE-acetaminophen (PERCOCET) 5-325 MG tablet Take 1 tablet by mouth every 4 (four) hours as needed for moderate pain. 6 tablet 0  . oxyCODONE-acetaminophen (PERCOCET) 5-325 MG tablet Take 1 tablet by mouth every 4 (four) hours as needed for moderate pain. 15 tablet 0  . PACLitaxel (TAXOL IV) Inject into the vein once a  week.     . pantoprazole (PROTONIX) 40 MG tablet TAKE 1 TABLET BY MOUTH EVERY DAY BEFORE BREAKFAST 90 tablet 3  . Polyvinyl Alcohol-Povidone PF (REFRESH) 1.4-0.6 % SOLN Place 1 drop into both eyes as needed (for dry eyes).     . potassium chloride (K-DUR) 10 MEQ tablet Take 2 tablets (20 mEq total) by mouth daily. 60 tablet 3  . prochlorperazine (COMPAZINE) 10 MG tablet Take 1 tablet (10 mg total) by mouth every 6 (six) hours as needed for nausea or vomiting. 30 tablet 1  . promethazine (PHENERGAN) 25 MG tablet Take 1 tablet (25 mg total) by mouth every 6 (six) hours as needed for nausea or vomiting. 30 tablet 3  . rizatriptan (MAXALT-MLT) 10 MG disintegrating tablet Take 10 mg by mouth every 2 (two) hours as needed for migraine.     . temazepam (RESTORIL) 15 MG capsule Take 1 capsule (15 mg total) by mouth at bedtime as needed for sleep. 30 capsule 0  . traMADol (ULTRAM) 50 MG tablet Take 1 tablet (50 mg total) by mouth 2 (two) times daily. (Patient taking differently: Take 50 mg by mouth as needed. ) 30 tablet 0  . zolpidem (AMBIEN) 10 MG tablet Take 1 tablet (10 mg total) by mouth at bedtime as needed for up to 30 days for sleep. 10 tablet 0   No current facility-administered medications on file prior to visit.     Allergies  Allergen Reactions  . Corn-Containing Products Other (See Comments)    Headache and GI upset  . Salagen [Pilocarpine] Other (See Comments)    Can cause liver failure   . Tape Itching and Other (See Comments)    Depending on the adhesive-blistering occurs  . Amoxicillin Hives and Other (See Comments)    Rash only DID THE REACTION INVOLVE: Swelling of the face/tongue/throat, SOB, or low BP? Sudden or severe rash/hives, skin peeling, or the inside of the mouth or nose?  Did it require medical treatment?  When did it last happen? If all above answers are "NO", may proceed with cephalosporin use.   . Caffeine Diarrhea, Nausea Only, Palpitations and Other (See  Comments)    Headache  . Tetanus Toxoids Swelling and Other (See Comments)    Local reaction    Social History   Substance and Sexual Activity  Alcohol Use No    Social History   Tobacco Use  Smoking Status Never Smoker  Smokeless Tobacco Never Used    Review of Systems  Constitutional: Positive for malaise/fatigue.  HENT: Positive for sore throat.  Eyes: Positive for blurred vision.  Respiratory: Negative.   Cardiovascular: Negative.   Gastrointestinal: Positive for heartburn.  Genitourinary: Positive for frequency.  Musculoskeletal: Positive for joint pain.  Skin: Negative.   Neurological: Negative.   Endo/Heme/Allergies: Negative.   Psychiatric/Behavioral: Negative.     Objective   Vitals:   12/08/18 1410  BP: 132/81  Pulse: (!) 125  Resp: 16  Temp: 97.7 F (36.5 C)  SpO2: 94%    Physical Exam Vitals signs reviewed.  Constitutional:      Appearance: She is not ill-appearing.  HENT:     Head: Normocephalic and atraumatic.  Cardiovascular:     Rate and Rhythm: Normal rate and regular rhythm.     Heart sounds: Normal heart sounds. No murmur. No friction rub. No gallop.   Pulmonary:     Effort: Pulmonary effort is normal. No respiratory distress.     Breath sounds: Normal breath sounds. No stridor. No wheezing, rhonchi or rales.  Skin:    General: Skin is warm and dry.     Comments: PICC line in place right arm.  Old Port-A-Cath scar right chest.  Neurological:     Mental Status: She is alert and oriented to person, place, and time.    Dr. Tomie China notes reviewed Assessment  Metastatic breast carcinoma, need for central venous access Plan   Patient is scheduled for Port-A-Cath insertion and PICC line removal on 12/23/2018.  The risks and benefits of the procedure including bleeding, infection, and pneumothorax were fully explained to the patient, who gave informed consent.

## 2018-12-10 ENCOUNTER — Encounter (HOSPITAL_COMMUNITY): Payer: Self-pay

## 2018-12-10 ENCOUNTER — Other Ambulatory Visit: Payer: Self-pay

## 2018-12-10 ENCOUNTER — Encounter (HOSPITAL_COMMUNITY): Payer: Self-pay | Admitting: Hematology

## 2018-12-10 ENCOUNTER — Inpatient Hospital Stay (HOSPITAL_COMMUNITY): Payer: 59

## 2018-12-10 ENCOUNTER — Inpatient Hospital Stay (HOSPITAL_BASED_OUTPATIENT_CLINIC_OR_DEPARTMENT_OTHER): Payer: 59 | Admitting: Hematology

## 2018-12-10 VITALS — BP 89/45 | HR 93 | Temp 98.6°F | Resp 16

## 2018-12-10 DIAGNOSIS — A419 Sepsis, unspecified organism: Secondary | ICD-10-CM | POA: Diagnosis not present

## 2018-12-10 DIAGNOSIS — K1230 Oral mucositis (ulcerative), unspecified: Secondary | ICD-10-CM

## 2018-12-10 DIAGNOSIS — Z170 Estrogen receptor positive status [ER+]: Secondary | ICD-10-CM

## 2018-12-10 DIAGNOSIS — C50112 Malignant neoplasm of central portion of left female breast: Secondary | ICD-10-CM | POA: Diagnosis not present

## 2018-12-10 DIAGNOSIS — K5900 Constipation, unspecified: Secondary | ICD-10-CM

## 2018-12-10 DIAGNOSIS — C50919 Malignant neoplasm of unspecified site of unspecified female breast: Secondary | ICD-10-CM

## 2018-12-10 DIAGNOSIS — Z9221 Personal history of antineoplastic chemotherapy: Secondary | ICD-10-CM

## 2018-12-10 DIAGNOSIS — G4700 Insomnia, unspecified: Secondary | ICD-10-CM

## 2018-12-10 DIAGNOSIS — G629 Polyneuropathy, unspecified: Secondary | ICD-10-CM

## 2018-12-10 LAB — CBC WITH DIFFERENTIAL/PLATELET
Abs Immature Granulocytes: 0 10*3/uL (ref 0.00–0.07)
Basophils Absolute: 0 10*3/uL (ref 0.0–0.1)
Basophils Relative: 3 %
Eosinophils Absolute: 0.1 10*3/uL (ref 0.0–0.5)
Eosinophils Relative: 7 %
HCT: 26.7 % — ABNORMAL LOW (ref 36.0–46.0)
Hemoglobin: 8.5 g/dL — ABNORMAL LOW (ref 12.0–15.0)
Immature Granulocytes: 0 %
Lymphocytes Relative: 69 %
Lymphs Abs: 0.5 10*3/uL — ABNORMAL LOW (ref 0.7–4.0)
MCH: 27.6 pg (ref 26.0–34.0)
MCHC: 31.8 g/dL (ref 30.0–36.0)
MCV: 86.7 fL (ref 80.0–100.0)
Monocytes Absolute: 0 10*3/uL — ABNORMAL LOW (ref 0.1–1.0)
Monocytes Relative: 6 %
Neutro Abs: 0.1 10*3/uL — ABNORMAL LOW (ref 1.7–7.7)
Neutrophils Relative %: 15 %
Platelets: 73 10*3/uL — ABNORMAL LOW (ref 150–400)
RBC: 3.08 MIL/uL — ABNORMAL LOW (ref 3.87–5.11)
RDW: 15.8 % — ABNORMAL HIGH (ref 11.5–15.5)
WBC: 0.7 10*3/uL — CL (ref 4.0–10.5)
nRBC: 0 % (ref 0.0–0.2)

## 2018-12-10 LAB — COMPREHENSIVE METABOLIC PANEL
ALT: 21 U/L (ref 0–44)
AST: 39 U/L (ref 15–41)
Albumin: 2.4 g/dL — ABNORMAL LOW (ref 3.5–5.0)
Alkaline Phosphatase: 125 U/L (ref 38–126)
Anion gap: 9 (ref 5–15)
BUN: 9 mg/dL (ref 6–20)
CO2: 26 mmol/L (ref 22–32)
Calcium: 8.6 mg/dL — ABNORMAL LOW (ref 8.9–10.3)
Chloride: 103 mmol/L (ref 98–111)
Creatinine, Ser: 0.54 mg/dL (ref 0.44–1.00)
GFR calc Af Amer: >60 mL/min (ref >60)
GFR calc non Af Amer: >60 mL/min (ref >60)
Glucose, Bld: 104 mg/dL — ABNORMAL HIGH (ref 70–99)
Potassium: 3.6 mmol/L (ref 3.5–5.1)
Sodium: 138 mmol/L (ref 135–145)
Total Bilirubin: 1.1 mg/dL (ref 0.3–1.2)
Total Protein: 5.3 g/dL — ABNORMAL LOW (ref 6.5–8.1)

## 2018-12-10 MED ORDER — FILGRASTIM 480 MCG/0.8ML IJ SOSY: 480.0000 ug | PREFILLED_SYRINGE | Freq: Once | INTRAMUSCULAR | Status: DC

## 2018-12-10 MED ORDER — HEPARIN SOD (PORK) LOCK FLUSH 100 UNIT/ML IV SOLN
500.0000 [IU] | Freq: Once | INTRAVENOUS | Status: AC | PRN
  Administered 2018-12-10: 12:00:00 500 [IU]

## 2018-12-10 MED ORDER — SODIUM CHLORIDE 0.9% FLUSH
10.0000 mL | Freq: Once | INTRAVENOUS | Status: AC | PRN
  Administered 2018-12-10: 10 mL

## 2018-12-10 MED ORDER — FILGRASTIM-SNDZ 480 MCG/0.8ML IJ SOSY
480.0000 ug | PREFILLED_SYRINGE | Freq: Once | INTRAMUSCULAR | Status: AC
  Administered 2018-12-10: 480 ug via SUBCUTANEOUS
  Filled 2018-12-10: qty 0.8

## 2018-12-10 MED ORDER — SODIUM CHLORIDE 0.9 % IV SOLN
Freq: Once | INTRAVENOUS | Status: AC
  Administered 2018-12-10: 10:00:00 via INTRAVENOUS
  Filled 2018-12-10: qty 1000

## 2018-12-10 NOTE — Patient Instructions (Signed)
[facility] at [facility]  Discharge Instructions:   _______________________________________________________________  Thank you for choosing [facility] at [facility] to provide your oncology and hematology care.  To afford each patient quality time with our providers, please arrive at least 15 minutes before your scheduled appointment.  You need to re-schedule your appointment if you arrive 10 or more minutes late.  We strive to give you quality time with our providers, and arriving late affects you and other patients whose appointments are after yours.  Also, if you no show three or more times for appointments you may be dismissed from the clinic.  Again, thank you for choosing  [facility] at  Hospital. Our hope is that these requests will allow you access to exceptional care and in a timely manner. _______________________________________________________________  If you have questions after your visit, please contact our office at [phone] between the hours of 8:30 a.m. and 5:00 p.m. Voicemails left after 4:30 p.m. will not be returned until the following business day. _______________________________________________________________  For prescription refill requests, have your pharmacy contact our office. _______________________________________________________________  Recommendations made by the consultant and any test results will be sent to your referring physician. _______________________________________________________________ 

## 2018-12-10 NOTE — Progress Notes (Signed)
CRITICAL VALUE ALERT Critical value received:  WBC 0.7 Date of notification:  12/10/18 Time of notification:0850 Critical value read back:  Yes.   Nurse who received alert:  TWJackson RN MD notified (1st page):  [name]

## 2018-12-10 NOTE — Progress Notes (Signed)
Per ATravis LPN, Hold tx today. Give Neupogen 480 mcg sq and IV hydration only/ NS with K+ 20 meq and Mag sulf. 2 grams.    IV hydration given today per MD orders. Tolerated infusion without adverse affects. Vital signs stable. No complaints at this time. Discharged from clinic ambulatory. F/U with [facility] as scheduled.

## 2018-12-10 NOTE — Progress Notes (Signed)
[place],  [id number]   CLINIC:  Medical Oncology/Hematology  PCP:  Doree Albee, MD [place] [address] [phone]   REASON FOR VISIT:  Follow-up for metastatic breast cancer.   BRIEF ONCOLOGIC HISTORY:  Oncology History  Metastatic breast cancer ([place])  07/02/2018 Initial Diagnosis   Metastatic breast cancer ([place])   09/18/2018 -  Chemotherapy   The patient had palonosetron (ALOXI) injection 0.25 mg, 0.25 mg, Intravenous,  Once, 3 of 4 cycles Administration: 0.25 mg (10/01/2018), 0.25 mg (10/09/2018), 0.25 mg (10/23/2018), 0.25 mg (10/30/2018), 0.25 mg (11/12/2018), 0.25 mg (12/03/2018) PACLitaxel (TAXOL) 120 mg in sodium chloride 0.9 % 250 mL chemo infusion (</= 60m/m2), 60 mg/m2 = 120 mg (100 % of original dose 60 mg/m2), Intravenous,  Once, 3 of 4 cycles Dose modification: 60 mg/m2 (original dose 60 mg/m2, Cycle 1, Reason: Change in LFTs), 80 mg/m2 (original dose 60 mg/m2, Cycle 2, Reason: Provider Judgment) Administration: 120 mg (09/18/2018), 120 mg (10/01/2018), 120 mg (10/23/2018), 120 mg (10/09/2018), 120 mg (10/30/2018), 162 mg (11/12/2018), 162 mg (12/03/2018)  for chemotherapy treatment.       CANCER STAGING: Cancer Staging No matching staging information was found for the patient.   INTERVAL HISTORY:  Ms. NDaniel89y.o. female seen for follow-up of metastatic breast cancer.  She received full dose Taxol last week.  Appetite is 50%.  Energy levels are 50%.  She developed mucositis 2 days ago.  She is using Orajel and Dukes Magic mouthwash.  Constipation is well managed with stool softener and MiraLAX.  She reported burning feet on and off.  Numbness in the tips of fingers is also on and off.  They have not worsened.  Shortness of breath on exertion is stable.  Weight is stable.  REVIEW OF SYSTEMS:  Review of Systems  Constitutional: Negative for fatigue.  HENT:   Positive for mouth sores.   Respiratory:  Positive for shortness of breath.   Neurological: Positive for numbness. Negative for dizziness.  All other systems reviewed and are negative.    PAST MEDICAL/SURGICAL HISTORY:  Past Medical History:  Diagnosis Date  . Anemia   . Anxiety   . Asthma   . Breast cancer ([place]    Left, 2017  . Depression   . GERD (gastroesophageal reflux disease)   . Migraine   . OAB (overactive bladder)   . Seasonal allergies    Past Surgical History:  Procedure Laterality Date  . ABDOMINAL HYSTERECTOMY     breast cancer  . ANKLE RECONSTRUCTION Right 1989  . BILIARY STENT PLACEMENT N/A 09/22/2018   Procedure: BILIARY STENT PLACEMENT;  Surgeon: RRogene Houston MD;  [facility];  Service: Endoscopy;  Laterality: N/A;  . BREAST SURGERY    . COLONOSCOPY WITH PROPOFOL N/A 08/06/2018   Procedure: COLONOSCOPY WITH PROPOFOL;  Surgeon: FDanie Binder MD;  [facility];  Service: Endoscopy;  Laterality: N/A;  1:30pm  . ERCP N/A 09/22/2018   Procedure: ENDOSCOPIC RETROGRADE CHOLANGIOPANCREATOGRAPHY (ERCP);  Surgeon: RRogene Houston MD;  [facility];  Service: Endoscopy;  Laterality: N/A;  . FOREIGN BODY REMOVAL N/A 08/29/2017   from lip  . KNEE SURGERY Right 1989   bone spur  . MASTECTOMY  2017   bil mastectomies  . SINUSOTOMY       SOCIAL HISTORY:  Social History   Socioeconomic History  . Marital status: Single    Spouse name:  Not on file  . Number of children: 1  . Years of education: 63  . Highest education level: Not on file  Occupational History  . Occupation: disabled  Social Needs  . Financial resource strain: Somewhat hard  . Food insecurity    Worry: Sometimes true    Inability: Sometimes true  . Transportation needs    Medical: No    Non-medical: No  Tobacco Use  . Smoking status: Never Smoker  . Smokeless tobacco: Never Used  Substance and Sexual Activity  . Alcohol use: No  . Drug use: No  . Sexual activity: Not Currently   Lifestyle  . Physical activity    Days per week: 0 days    Minutes per session: 0 min  . Stress: Rather much  Relationships  . Social Herbalist on phone: Once a week    Gets together: Once a week    Attends religious service: 1 to 4 times per year    Active member of club or organization: Yes    Attends meetings of clubs or organizations: Never    Relationship status: Never married  . Intimate partner violence    Fear of current or ex partner: No    Emotionally abused: No    Physically abused: No    Forced sexual activity: No  Other Topics Concern  . Not on file  Social History Narrative   Bachelors degree   Accounting   Lives with daughter [name] who has autism and diabetes   Likes to sew, quilt, crafts, crochet    FAMILY HISTORY:  Family History  Problem Relation Age of Onset  . Arthritis Mother   . COPD Mother   . Depression Mother   . Diabetes Mother   . Kidney disease Father   . Heart disease Father 32  . Drug abuse Father   . Hypertension Father   . Diabetes Daughter   . Hashimoto's thyroiditis Daughter   . Irritable bowel syndrome Daughter   . Autism spectrum disorder Daughter   . Early death Maternal Grandmother        drowned  . Early death Maternal Grandfather   . Heart disease Maternal Grandfather   . Heart disease Paternal Grandfather   . Breast cancer Maternal Aunt   . Breast cancer Cousin   . Lung cancer Maternal Aunt   . Lung cancer Maternal Uncle   . Colon cancer Neg Hx     CURRENT MEDICATIONS:  Outpatient Encounter Medications as of 12/10/2018  Medication Sig Note  . b complex vitamins capsule Take 1 capsule by mouth daily.   . Cholecalciferol (VITAMIN D3 GUMMIES) 25 MCG (1000 UT) CHEW Chew 2,000-4,000 Units by mouth daily.   Marland Kitchen docusate sodium (COLACE) 100 MG capsule Take 1 capsule (100 mg total) by mouth 2 (two) times daily as needed for mild constipation.   Marland Kitchen escitalopram (LEXAPRO) 20 MG tablet Take 1 tablet (20 mg total) by  mouth daily.   Marland Kitchen Lysine 1000 MG TABS Take 1,000 mg by mouth daily. 12/03/2018: Pt hasn't started taking yet  . magic mouthwash w/lidocaine SOLN Take 5 mLs by mouth 4 (four) times daily as needed for mouth pain.   . magnesium oxide (MAG-OX) 400 MG tablet TAKE 1 TABLET BY MOUTH EVERY DAY (Patient taking differently: Take 400 mg by mouth daily. )   . nystatin (MYCOSTATIN) 100000 UNIT/ML suspension Take 5 mLs (500,000 Units total) by mouth 4 (four) times daily. Swish and gargle - then  either spit or swallow   . ondansetron (ZOFRAN-ODT) 8 MG disintegrating tablet TAKE 1 TABLET (8 MG TOTAL) BY MOUTH EVERY 8 (EIGHT) HOURS AS NEEDED FOR NAUSEA OR VOMITING.   Marland Kitchen oxyCODONE-acetaminophen (PERCOCET) 5-325 MG tablet Take 1 tablet by mouth every 4 (four) hours as needed for moderate pain.   Marland Kitchen oxyCODONE-acetaminophen (PERCOCET) 5-325 MG tablet Take 1 tablet by mouth every 4 (four) hours as needed for moderate pain. (Patient not taking: Reported on 12/10/2018)   . PACLitaxel (TAXOL IV) Inject into the vein once a week.    . pantoprazole (PROTONIX) 40 MG tablet TAKE 1 TABLET BY MOUTH EVERY DAY BEFORE BREAKFAST (Patient taking differently: Take 40 mg by mouth daily. )   . Polyvinyl Alcohol-Povidone PF (REFRESH) 1.4-0.6 % SOLN Place 1 drop into both eyes as needed (for dry eyes).    . potassium chloride (K-DUR) 10 MEQ tablet Take 2 tablets (20 mEq total) by mouth daily.   . prochlorperazine (COMPAZINE) 10 MG tablet Take 1 tablet (10 mg total) by mouth every 6 (six) hours as needed for nausea or vomiting.   . promethazine (PHENERGAN) 25 MG tablet Take 1 tablet (25 mg total) by mouth every 6 (six) hours as needed for nausea or vomiting.   . rizatriptan (MAXALT-MLT) 10 MG disintegrating tablet Take 10 mg by mouth every 2 (two) hours as needed for migraine.    . temazepam (RESTORIL) 15 MG capsule Take 1 capsule (15 mg total) by mouth at bedtime as needed for sleep.   . traMADol (ULTRAM) 50 MG tablet Take 1 tablet (50 mg total)  by mouth 2 (two) times daily.   Marland Kitchen zolpidem (AMBIEN) 10 MG tablet Take 1 tablet (10 mg total) by mouth at bedtime as needed for up to 30 days for sleep. (Patient not taking: Reported on 12/10/2018)   . [DISCONTINUED] antiseptic oral rinse (BIOTENE) LIQD 15 mLs by Mouth Rinse route as needed for dry mouth.   . [DISCONTINUED] naproxen sodium (ALEVE) 220 MG tablet Take 220-440 mg by mouth 2 (two) times daily as needed (for pain or headache).    . [DISCONTINUED] filgrastim (NEUPOGEN) injection 480 mcg  12/10/2018: must use zarxio   No facility-administered encounter medications on file as of 12/10/2018.     ALLERGIES:  Allergies  Allergen Reactions  . Corn-Containing Products Other (See Comments)    Headache and GI upset  . Salagen [Pilocarpine] Other (See Comments)    Can cause liver failure   . Tape Itching and Other (See Comments)    Depending on the adhesive-blistering occurs  . Amoxicillin Hives and Other (See Comments)    Rash only DID THE REACTION INVOLVE: Swelling of the face/tongue/throat, SOB, or low BP? Sudden or severe rash/hives, skin peeling, or the inside of the mouth or nose?  Did it require medical treatment?  When did it last happen? If all above answers are "NO", may proceed with cephalosporin use.   . Caffeine Diarrhea, Nausea Only, Palpitations and Other (See Comments)    Headache  . Tetanus Toxoids Swelling and Other (See Comments)    Local reaction     PHYSICAL EXAM:  ECOG Performance status: 1 Blood pressure is 106/73.  Pulse rate is 110.  Respiratory is 18.  Temperature 97.5.  Saturations are 97. Physical Exam Vitals signs reviewed.  Constitutional:      Appearance: Normal appearance.  Cardiovascular:     Rate and Rhythm: Normal rate and regular rhythm.     Heart sounds: Normal heart sounds.  Pulmonary:     Effort: Pulmonary effort is normal.     Breath sounds: Normal breath sounds.  Abdominal:     General: There is no distension.     Palpations:  Abdomen is soft. There is no mass.  Musculoskeletal:        General: No swelling.  Skin:    General: Skin is warm.  Neurological:     General: No focal deficit present.     Mental Status: She is alert and oriented to person, place, and time.  Psychiatric:        Mood and Affect: Mood normal.        Behavior: Behavior normal.      LABORATORY DATA:  I have reviewed the labs as listed.  CBC    Component Value Date/Time   WBC 0.7 (LL) 12/10/2018 0759   RBC 3.08 (L) 12/10/2018 0759   HGB 8.5 (L) 12/10/2018 0759   HCT 26.7 (L) 12/10/2018 0759   PLT 73 (L) 12/10/2018 0759   MCV 86.7 12/10/2018 0759   MCH 27.6 12/10/2018 0759   MCHC 31.8 12/10/2018 0759   RDW 15.8 (H) 12/10/2018 0759   LYMPHSABS 0.5 (L) 12/10/2018 0759   MONOABS 0.0 (L) 12/10/2018 0759   EOSABS 0.1 12/10/2018 0759   BASOSABS 0.0 12/10/2018 0759   CMP Latest Ref Rng & Units 12/10/2018 12/03/2018 11/26/2018  Glucose 70 - 99 mg/dL 104(H) 141(H) 125(H)  BUN 6 - 20 mg/dL '9 7 6  ' Creatinine 0.44 - 1.00 mg/dL 0.54 0.65 0.62  Sodium 135 - 145 mmol/L 138 138 135  Potassium 3.5 - 5.1 mmol/L 3.6 3.0(L) 2.7(LL)  Chloride 98 - 111 mmol/L 103 101 99  CO2 22 - 32 mmol/L '26 26 28  ' Calcium 8.9 - 10.3 mg/dL 8.6(L) 8.4(L) 8.5(L)  Total Protein 6.5 - 8.1 g/dL 5.3(L) 6.0(L) 5.8(L)  Total Bilirubin 0.3 - 1.2 mg/dL 1.1 1.0 1.2  Alkaline Phos 38 - 126 U/L 125 218(H) 168(H)  AST 15 - 41 U/L 39 51(H) 45(H)  ALT 0 - 44 U/L '21 25 23       ' DIAGNOSTIC IMAGING:  I have independently reviewed the scans and discussed with the patient.   I have reviewed Venita Lick LPN's note and agree with the documentation.  I personally performed a face-to-face visit, made revisions and my assessment and plan is as follows.    ASSESSMENT & PLAN:   Malignant neoplasm of central portion of left breast in female, estrogen receptor positive ([place]) 1.  Metastatic breast cancer to the liver: - Left breast cancer diagnosed in January 2017, treated  with chemotherapy with dose dense AC followed by Taxol, lumpectomy. - CT CAP on 06/17/2018 shows very large hepatic mass measuring 22 cm, mildly enlarged left internal mammary node, borderline enlarged left lower leg node. -Biopsy of the liver mass on 06/26/2018 shows breast cancer, ER/PR positive and HER-2 negative. -PET CT scan on 07/13/2018 shows very large centrally necrotic mass involving the right and left hepatic lobes, and other hypermetabolic lesion in the left lobe.  Multiple hypermetabolic lymph nodes in the upper abdomen, left supraclavicular space.  MRI of the T-spine on 08/31/2018 shows possible small vertebral body metastasis at T5 and T8. - Abemaciclib and Faslodex from 07/02/2018 through 09/07/2018 discontinued for progression. - ERCP and plastic stent in the hepatic duct on 09/22/2018 by Dr.Rehman - Paclitaxel 60 mg/m2 3 weeks on 1 week off started on 09/18/2018. -She could not tolerate solid when it was added to the current  regimen. -Taxol dose increased to 80 mg per metered square on 11/12/2018. -Last treatment with full dose Taxol was on 12/03/2018. -She had mucositis which started 2 days ago.  She is using Orajel and Duke's Magic mouthwash.  Shortness of breath on exertion is stable. - Today her white count is 0.7.  We will give her Neupogen 480 mcg. -We are holding her treatment today. - She will be reassessed again next week.  I will consider dose reduction of Taxol at next time.  2.  Constipation: -She could not tolerate lactulose. -She is taking stool softener and MiraLAX.  This is helping.    3.  Sleeping difficulty: -She is taking Restoril 15 mg at bedtime.  Ambien did not help.  4.  Neuropathy: -She has developed some on and off burning in the feet for the last couple of weeks. - Numbness of the tips of fingers was also on and off.  We will keep a close eye on it. - Will consider dose reduction of Taxol.     Time spent is 25 minutes with more than 50% of the time spent  face-to-face discussing treatment plan and coordination of care.  Orders placed this encounter:  No orders of the defined types were placed in this encounter.     Derek Jack, MD [place] [phone]

## 2018-12-10 NOTE — Patient Instructions (Addendum)
[facility] at [facility] Discharge Instructions  You were seen today by Dr. Katragadda. He went over your recent lab results. He will see you back in 1 week for labs and follow up.   Thank you for choosing [facility] at [facility] to provide your oncology and hematology care.  To afford each patient quality time with our provider, please arrive at least 15 minutes before your scheduled appointment time.   If you have a lab appointment with the Cancer Center please come in thru the  Main Entrance and check in at the main information desk  You need to re-schedule your appointment should you arrive 10 or more minutes late.  We strive to give you quality time with our providers, and arriving late affects you and other patients whose appointments are after yours.  Also, if you no show three or more times for appointments you may be dismissed from the clinic at the providers discretion.     Again, thank you for choosing [facility].  Our hope is that these requests will decrease the amount of time that you wait before being seen by our physicians.       _____________________________________________________________  Should you have questions after your visit to [facility], please contact our office at [phone] between the hours of 8:00 a.m. and 4:30 p.m.  Voicemails left after 4:00 p.m. will not be returned until the following business day.  For prescription refill requests, have your pharmacy contact our office and allow 72 hours.    Cancer Center Support Programs:   > Cancer Support Group  2nd Tuesday of the month 1pm-2pm, Journey Room    

## 2018-12-10 NOTE — Assessment & Plan Note (Signed)
1.  Metastatic breast cancer to the liver: - Left breast cancer diagnosed in January 2017, treated with chemotherapy with dose dense AC followed by Taxol, lumpectomy. - CT CAP on 06/17/2018 shows very large hepatic mass measuring 22 cm, mildly enlarged left internal mammary node, borderline enlarged left lower leg node. -Biopsy of the liver mass on 06/26/2018 shows breast cancer, ER/PR positive and HER-2 negative. -PET CT scan on 07/13/2018 shows very large centrally necrotic mass involving the right and left hepatic lobes, and other hypermetabolic lesion in the left lobe.  Multiple hypermetabolic lymph nodes in the upper abdomen, left supraclavicular space.  MRI of the T-spine on 08/31/2018 shows possible small vertebral body metastasis at T5 and T8. - Abemaciclib and Faslodex from 07/02/2018 through 09/07/2018 discontinued for progression. - ERCP and plastic stent in the hepatic duct on 09/22/2018 by Dr.Rehman - Paclitaxel 60 mg/m2 3 weeks on 1 week off started on 09/18/2018. -She could not tolerate solid when it was added to the current regimen. -Taxol dose increased to 80 mg per metered square on 11/12/2018. -Last treatment with full dose Taxol was on 12/03/2018. -She had mucositis which started 2 days ago.  She is using Orajel and [employer] Magic mouthwash.  Shortness of breath on exertion is stable. - Today her white count is 0.7.  We will give her Neupogen 480 mcg. -We are holding her treatment today. - She will be reassessed again next week.  I will consider dose reduction of Taxol at next time.  2.  Constipation: -She could not tolerate lactulose. -She is taking stool softener and MiraLAX.  This is helping.    3.  Sleeping difficulty: -She is taking Restoril 15 mg at bedtime.  Ambien did not help.  4.  Neuropathy: -She has developed some on and off burning in the feet for the last couple of weeks. - Numbness of the tips of fingers was also on and off.  We will keep a close eye on it. - Will  consider dose reduction of Taxol.

## 2018-12-12 ENCOUNTER — Inpatient Hospital Stay (HOSPITAL_COMMUNITY)
Admission: EM | Admit: 2018-12-12 | Discharge: 2018-12-16 | DRG: 871 | Disposition: A | Discharge status: Home Health | Payer: 59 | Attending: Family Medicine | Admitting: Family Medicine

## 2018-12-12 ENCOUNTER — Emergency Department (HOSPITAL_COMMUNITY): Payer: 59

## 2018-12-12 ENCOUNTER — Other Ambulatory Visit: Payer: Self-pay

## 2018-12-12 ENCOUNTER — Encounter (HOSPITAL_COMMUNITY): Payer: Self-pay | Admitting: Emergency Medicine

## 2018-12-12 DIAGNOSIS — A419 Sepsis, unspecified organism: Principal | ICD-10-CM | POA: Diagnosis present

## 2018-12-12 DIAGNOSIS — Z9889 Other specified postprocedural states: Secondary | ICD-10-CM

## 2018-12-12 DIAGNOSIS — Z853 Personal history of malignant neoplasm of breast: Secondary | ICD-10-CM | POA: Diagnosis not present

## 2018-12-12 DIAGNOSIS — J189 Pneumonia, unspecified organism: Secondary | ICD-10-CM | POA: Diagnosis present

## 2018-12-12 DIAGNOSIS — E43 Unspecified severe protein-calorie malnutrition: Secondary | ICD-10-CM | POA: Diagnosis present

## 2018-12-12 DIAGNOSIS — R5081 Fever presenting with conditions classified elsewhere: Secondary | ICD-10-CM | POA: Diagnosis present

## 2018-12-12 DIAGNOSIS — Z515 Encounter for palliative care: Secondary | ICD-10-CM

## 2018-12-12 DIAGNOSIS — Z803 Family history of malignant neoplasm of breast: Secondary | ICD-10-CM | POA: Diagnosis not present

## 2018-12-12 DIAGNOSIS — K1230 Oral mucositis (ulcerative), unspecified: Secondary | ICD-10-CM | POA: Diagnosis present

## 2018-12-12 DIAGNOSIS — I313 Pericardial effusion (noninflammatory): Secondary | ICD-10-CM | POA: Diagnosis not present

## 2018-12-12 DIAGNOSIS — Z20828 Contact with and (suspected) exposure to other viral communicable diseases: Secondary | ICD-10-CM | POA: Diagnosis present

## 2018-12-12 DIAGNOSIS — F419 Anxiety disorder, unspecified: Secondary | ICD-10-CM | POA: Diagnosis present

## 2018-12-12 DIAGNOSIS — Y95 Nosocomial condition: Secondary | ICD-10-CM | POA: Diagnosis present

## 2018-12-12 DIAGNOSIS — C50919 Malignant neoplasm of unspecified site of unspecified female breast: Secondary | ICD-10-CM | POA: Diagnosis present

## 2018-12-12 DIAGNOSIS — E869 Volume depletion, unspecified: Secondary | ICD-10-CM | POA: Diagnosis present

## 2018-12-12 DIAGNOSIS — F329 Major depressive disorder, single episode, unspecified: Secondary | ICD-10-CM | POA: Diagnosis present

## 2018-12-12 DIAGNOSIS — E876 Hypokalemia: Secondary | ICD-10-CM | POA: Diagnosis present

## 2018-12-12 DIAGNOSIS — J9621 Acute and chronic respiratory failure with hypoxia: Secondary | ICD-10-CM | POA: Diagnosis present

## 2018-12-12 DIAGNOSIS — N3281 Overactive bladder: Secondary | ICD-10-CM | POA: Diagnosis present

## 2018-12-12 DIAGNOSIS — D61810 Antineoplastic chemotherapy induced pancytopenia: Secondary | ICD-10-CM | POA: Diagnosis present

## 2018-12-12 DIAGNOSIS — J90 Pleural effusion, not elsewhere classified: Secondary | ICD-10-CM | POA: Diagnosis present

## 2018-12-12 DIAGNOSIS — T451X5A Adverse effect of antineoplastic and immunosuppressive drugs, initial encounter: Secondary | ICD-10-CM | POA: Diagnosis present

## 2018-12-12 DIAGNOSIS — Z79899 Other long term (current) drug therapy: Secondary | ICD-10-CM | POA: Diagnosis not present

## 2018-12-12 DIAGNOSIS — Z9013 Acquired absence of bilateral breasts and nipples: Secondary | ICD-10-CM

## 2018-12-12 DIAGNOSIS — G4700 Insomnia, unspecified: Secondary | ICD-10-CM | POA: Diagnosis present

## 2018-12-12 DIAGNOSIS — D709 Neutropenia, unspecified: Secondary | ICD-10-CM | POA: Diagnosis present

## 2018-12-12 DIAGNOSIS — E871 Hypo-osmolality and hyponatremia: Secondary | ICD-10-CM | POA: Diagnosis present

## 2018-12-12 DIAGNOSIS — K219 Gastro-esophageal reflux disease without esophagitis: Secondary | ICD-10-CM | POA: Diagnosis present

## 2018-12-12 DIAGNOSIS — Z90710 Acquired absence of both cervix and uterus: Secondary | ICD-10-CM

## 2018-12-12 DIAGNOSIS — J45909 Unspecified asthma, uncomplicated: Secondary | ICD-10-CM | POA: Diagnosis present

## 2018-12-12 DIAGNOSIS — Z801 Family history of malignant neoplasm of trachea, bronchus and lung: Secondary | ICD-10-CM | POA: Diagnosis not present

## 2018-12-12 DIAGNOSIS — K831 Obstruction of bile duct: Secondary | ICD-10-CM | POA: Diagnosis present

## 2018-12-12 DIAGNOSIS — C787 Secondary malignant neoplasm of liver and intrahepatic bile duct: Secondary | ICD-10-CM | POA: Diagnosis present

## 2018-12-12 DIAGNOSIS — Z7189 Other specified counseling: Secondary | ICD-10-CM | POA: Diagnosis not present

## 2018-12-12 DIAGNOSIS — F32A Depression, unspecified: Secondary | ICD-10-CM | POA: Diagnosis present

## 2018-12-12 DIAGNOSIS — R600 Localized edema: Secondary | ICD-10-CM

## 2018-12-12 LAB — CBC WITH DIFFERENTIAL/PLATELET
Abs Immature Granulocytes: 0.05 10*3/uL (ref 0.00–0.07)
Basophils Absolute: 0 10*3/uL (ref 0.0–0.1)
Basophils Relative: 1 %
Eosinophils Absolute: 0 10*3/uL (ref 0.0–0.5)
Eosinophils Relative: 1 %
HCT: 29.8 % — ABNORMAL LOW (ref 36.0–46.0)
Hemoglobin: 9.7 g/dL — ABNORMAL LOW (ref 12.0–15.0)
Immature Granulocytes: 4 %
Lymphocytes Relative: 36 %
Lymphs Abs: 0.4 10*3/uL — ABNORMAL LOW (ref 0.7–4.0)
MCH: 27.7 pg (ref 26.0–34.0)
MCHC: 32.6 g/dL (ref 30.0–36.0)
MCV: 85.1 fL (ref 80.0–100.0)
Monocytes Absolute: 0.7 10*3/uL (ref 0.1–1.0)
Monocytes Relative: 55 %
Neutro Abs: 0 10*3/uL — ABNORMAL LOW (ref 1.7–7.7)
Neutrophils Relative %: 3 %
Platelets: 69 10*3/uL — ABNORMAL LOW (ref 150–400)
RBC: 3.5 MIL/uL — ABNORMAL LOW (ref 3.87–5.11)
RDW: 16.1 % — ABNORMAL HIGH (ref 11.5–15.5)
WBC: 1.2 10*3/uL — CL (ref 4.0–10.5)
nRBC: 8.9 % — ABNORMAL HIGH (ref 0.0–0.2)

## 2018-12-12 LAB — PROTIME-INR
INR: 1.7 — ABNORMAL HIGH (ref 0.8–1.2)
Prothrombin Time: 19.4 seconds — ABNORMAL HIGH (ref 11.4–15.2)

## 2018-12-12 LAB — COMPREHENSIVE METABOLIC PANEL
ALT: 22 U/L (ref 0–44)
AST: 40 U/L (ref 15–41)
Albumin: 2.4 g/dL — ABNORMAL LOW (ref 3.5–5.0)
Alkaline Phosphatase: 135 U/L — ABNORMAL HIGH (ref 38–126)
Anion gap: 14 (ref 5–15)
BUN: 11 mg/dL (ref 6–20)
CO2: 21 mmol/L — ABNORMAL LOW (ref 22–32)
Calcium: 8.3 mg/dL — ABNORMAL LOW (ref 8.9–10.3)
Chloride: 98 mmol/L (ref 98–111)
Creatinine, Ser: 0.93 mg/dL (ref 0.44–1.00)
GFR calc Af Amer: >60 mL/min (ref >60)
GFR calc non Af Amer: >60 mL/min (ref >60)
Glucose, Bld: 99 mg/dL (ref 70–99)
Potassium: 3.4 mmol/L — ABNORMAL LOW (ref 3.5–5.1)
Sodium: 133 mmol/L — ABNORMAL LOW (ref 135–145)
Total Bilirubin: 2.4 mg/dL — ABNORMAL HIGH (ref 0.3–1.2)
Total Protein: 5.7 g/dL — ABNORMAL LOW (ref 6.5–8.1)

## 2018-12-12 LAB — LACTIC ACID, PLASMA
Lactic Acid, Venous: 4.3 mmol/L (ref 0.5–1.9)
Lactic Acid, Venous: 3.6 mmol/L (ref 0.5–1.9)

## 2018-12-12 LAB — APTT: aPTT: 45 seconds — ABNORMAL HIGH (ref 24–36)

## 2018-12-12 LAB — SARS CORONAVIRUS 2 BY RT PCR (HOSPITAL ORDER, PERFORMED IN ~~LOC~~ HOSPITAL LAB): SARS Coronavirus 2: NEGATIVE

## 2018-12-12 LAB — TECHNOLOGIST SMEAR REVIEW: Plt Morphology: NONE SEEN

## 2018-12-12 LAB — PROCALCITONIN: Procalcitonin: 1.69 ng/mL

## 2018-12-12 LAB — LACTATE DEHYDROGENASE: LDH: 173 U/L (ref 98–192)

## 2018-12-12 LAB — D-DIMER, QUANTITATIVE: D-Dimer, Quant: 5.84 ug/mL-FEU — ABNORMAL HIGH (ref 0.00–0.50)

## 2018-12-12 IMAGING — CR PORTABLE CHEST - 1 VIEW
1 series · 2 of 2 positions shown · non-contrast
Comparison: [DATE]

CLINICAL DATA: Shortness of breath

EXAM:
PORTABLE CHEST 1 VIEW

[Series 1: portable · 0.17mm/px · 2 of 2 slices shown]
[im 1/2]
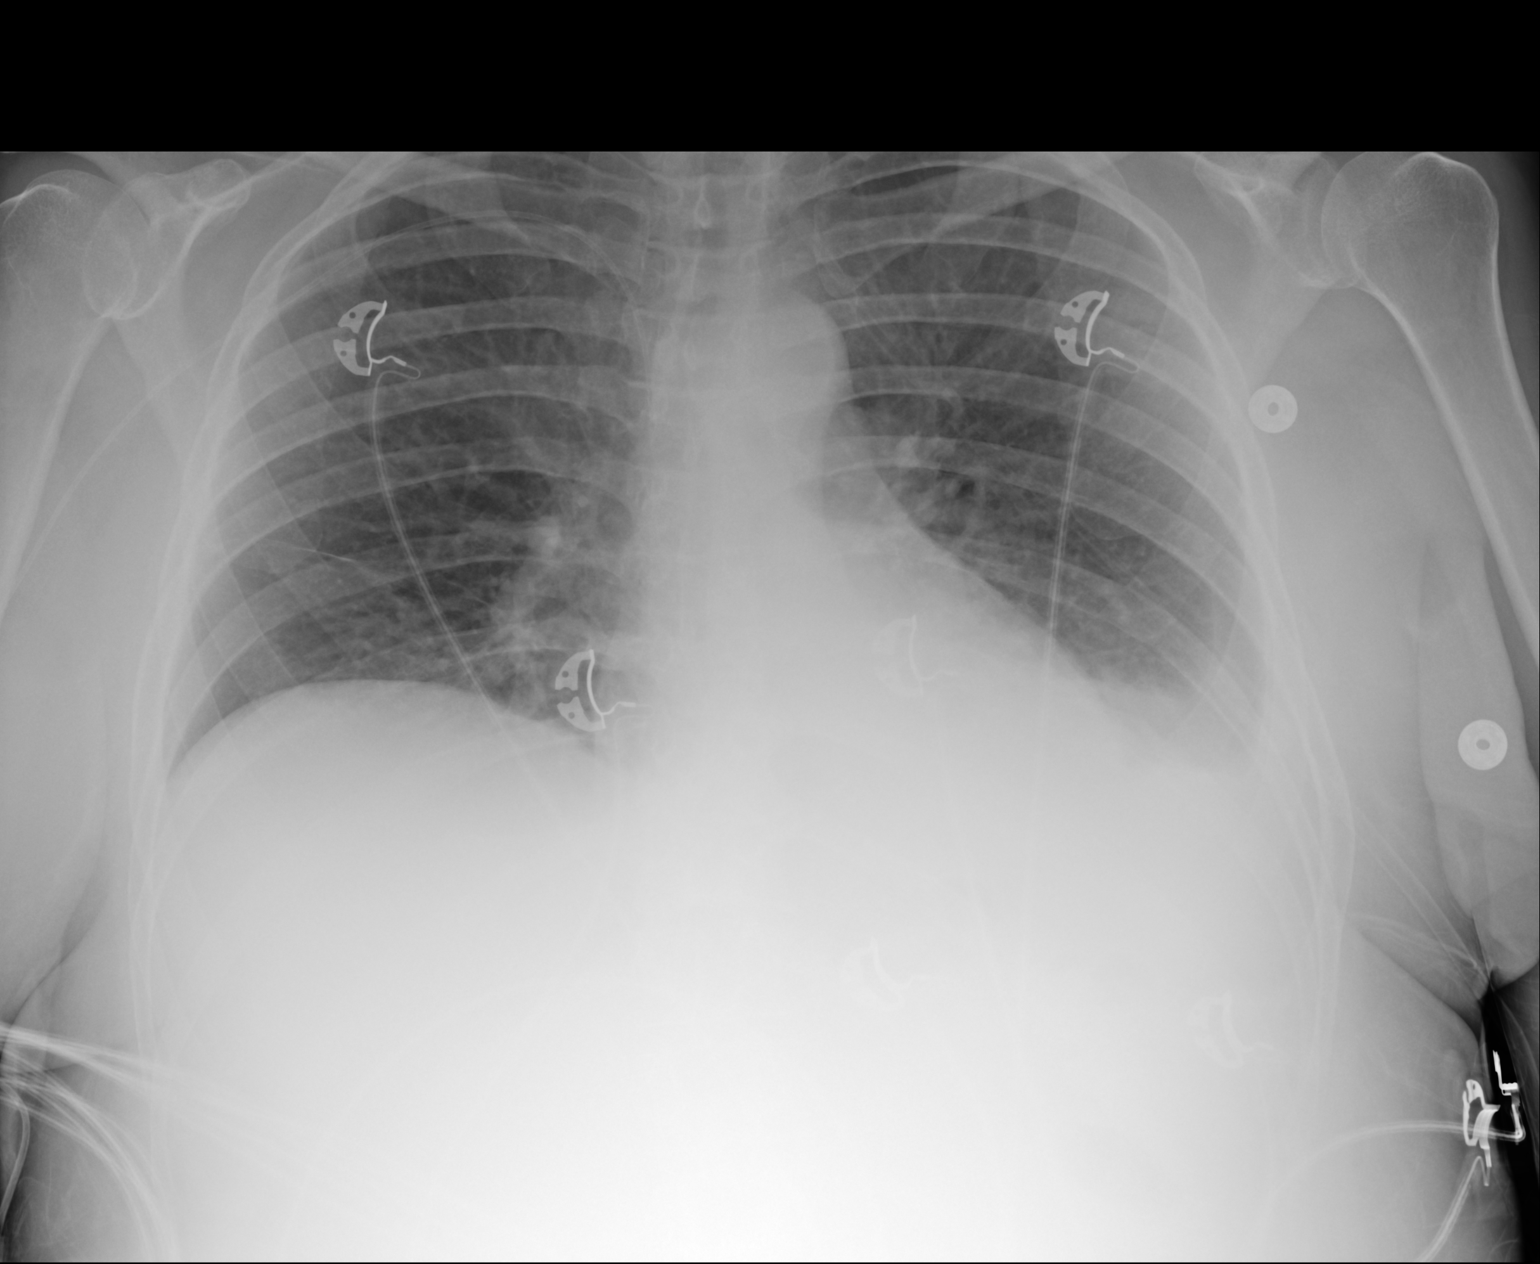
[im 2/2]
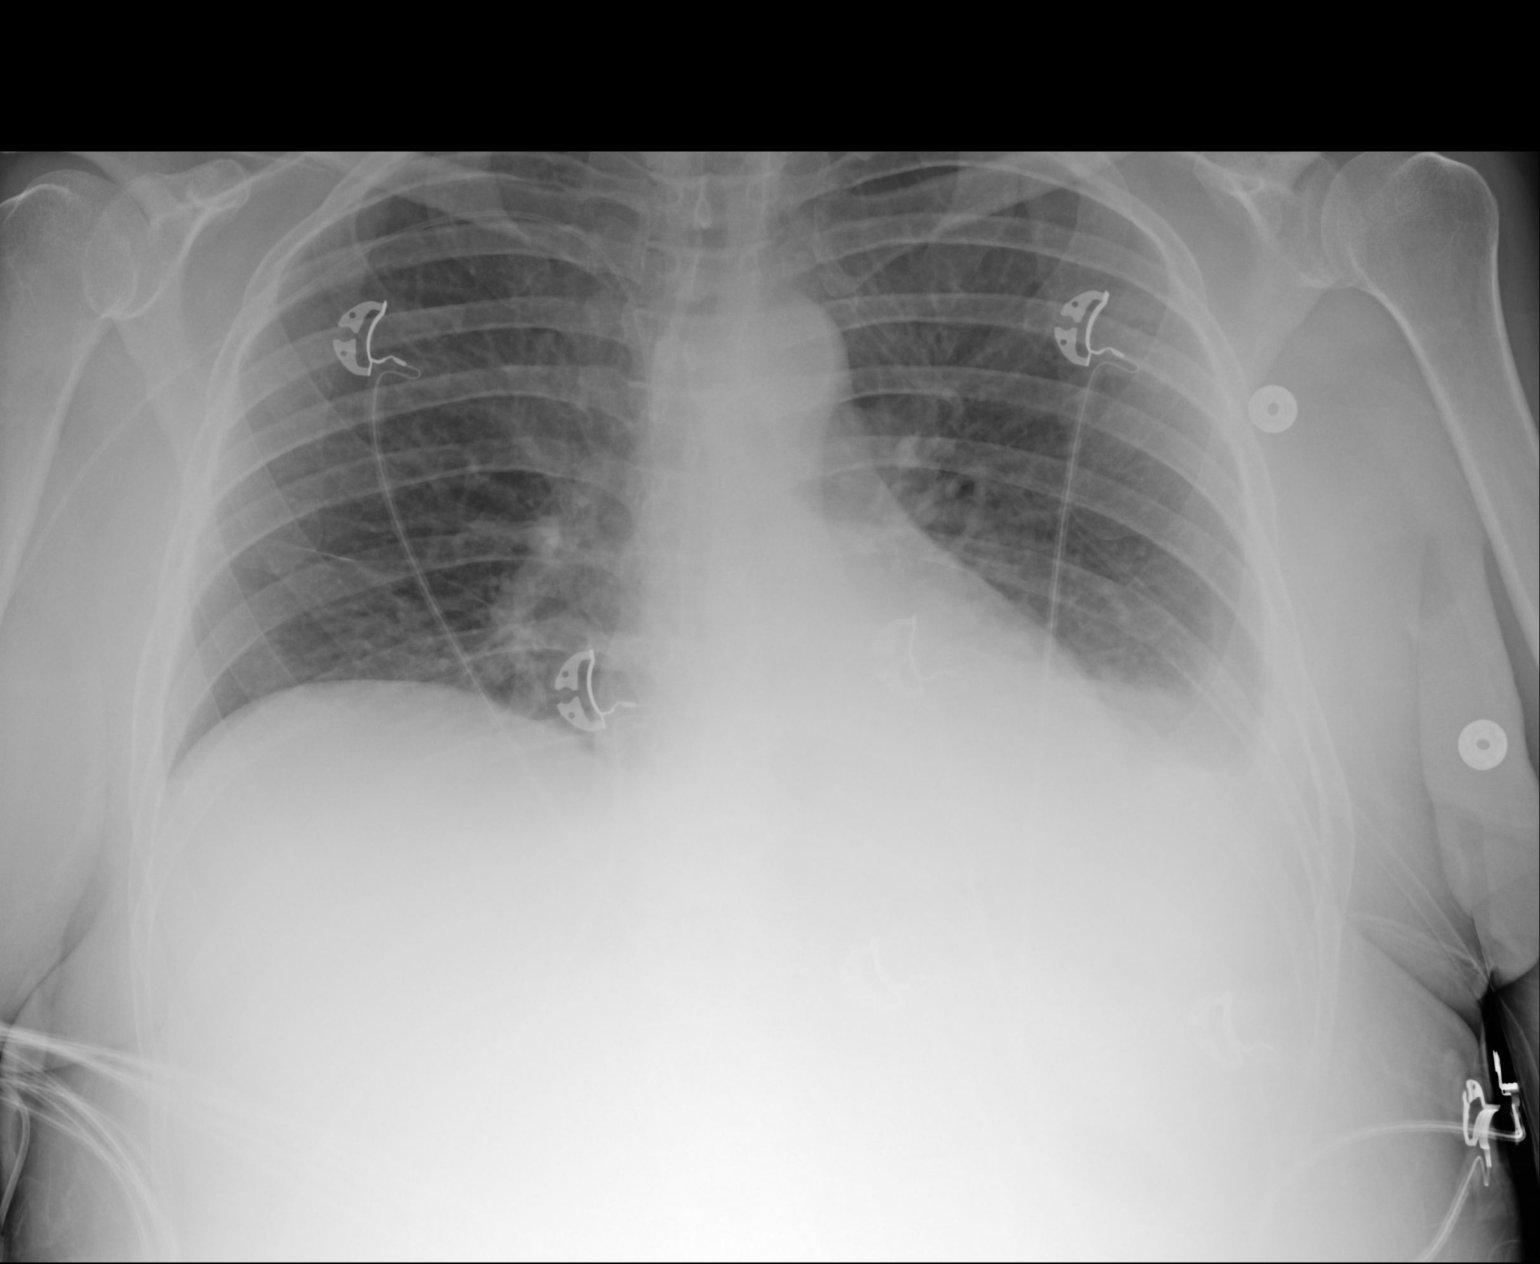

[2 of 2 positions shown; findings below may reference images not displayed]

FINDINGS: The heart size and mediastinal contours are stable. Right central
venous line is unchanged. There is patchy opacity of left lung base
with left pleural effusion. The right lung is clear. The visualized
skeletal structures are unremarkable.
IMPRESSION: Patchy consolidation of left lung base pneumonia is not excluded.
Left pleural effusion is noted.

## 2018-12-12 MED ORDER — SODIUM CHLORIDE 0.9 % IV SOLN: Freq: Once | INTRAVENOUS | Status: DC

## 2018-12-12 MED ORDER — ACETAMINOPHEN 160 MG/5ML PO SUSP
ORAL | Status: AC
  Administered 2018-12-12: 640 mg
  Filled 2018-12-12: qty 20

## 2018-12-12 MED ORDER — POTASSIUM CHLORIDE IN NACL 20-0.9 MEQ/L-% IV SOLN
INTRAVENOUS | Status: AC
  Administered 2018-12-13: 02:00:00 via INTRAVENOUS
  Filled 2018-12-12: qty 1000

## 2018-12-12 MED ORDER — LEVOFLOXACIN IN D5W 750 MG/150ML IV SOLN: 750.0000 mg | INTRAVENOUS | Status: DC

## 2018-12-12 MED ORDER — LEVOFLOXACIN IN D5W 750 MG/150ML IV SOLN
750.0000 mg | Freq: Once | INTRAVENOUS | Status: AC
  Administered 2018-12-12: 16:00:00 750 mg via INTRAVENOUS
  Filled 2018-12-12: qty 150

## 2018-12-12 MED ORDER — SODIUM CHLORIDE 0.9 % IV BOLUS (SEPSIS)
1000.0000 mL | Freq: Once | INTRAVENOUS | Status: AC
  Administered 2018-12-12: 16:00:00 1000 mL via INTRAVENOUS

## 2018-12-12 MED ORDER — SODIUM CHLORIDE 0.9 % IV SOLN
Freq: Once | INTRAVENOUS | Status: AC
  Administered 2018-12-12: 21:00:00 via INTRAVENOUS

## 2018-12-12 MED ORDER — VANCOMYCIN HCL 1.5 G IV SOLR
1500.0000 mg | Freq: Once | INTRAVENOUS | Status: AC
  Administered 2018-12-12: 1500 mg via INTRAVENOUS
  Filled 2018-12-12: qty 1500

## 2018-12-12 MED ORDER — SODIUM CHLORIDE 0.9% FLUSH
3.0000 mL | Freq: Once | INTRAVENOUS | Status: AC
  Administered 2018-12-12: 17:00:00 3 mL via INTRAVENOUS

## 2018-12-12 MED ORDER — ACETAMINOPHEN 325 MG PO TABS: 650.0000 mg | ORAL_TABLET | Freq: Four times a day (QID) | ORAL | Status: DC | PRN

## 2018-12-12 MED ORDER — LIDOCAINE VISCOUS HCL 2 % MT SOLN
15.0000 mL | Freq: Once | OROMUCOSAL | Status: AC
  Administered 2018-12-12: 19:00:00 15 mL via OROMUCOSAL
  Filled 2018-12-12: qty 15

## 2018-12-12 MED ORDER — SODIUM CHLORIDE 0.9 % IV BOLUS (SEPSIS)
250.0000 mL | Freq: Once | INTRAVENOUS | Status: AC
  Administered 2018-12-12: 250 mL via INTRAVENOUS

## 2018-12-12 MED ORDER — PRO-STAT SUGAR FREE PO LIQD
30.0000 mL | Freq: Two times a day (BID) | ORAL | Status: DC
  Administered 2018-12-13 – 2018-12-16 (×7): 30 mL via ORAL
  Filled 2018-12-12 (×11): qty 30

## 2018-12-12 MED ORDER — VANCOMYCIN HCL 10 G IV SOLR
INTRAVENOUS | Status: AC
  Filled 2018-12-12: qty 1500

## 2018-12-12 NOTE — ED Notes (Signed)
Date and time results received: 12/12/18 4:17 PM  (use smartphrase ".now" to insert current time)  Test: Lactic Critical Value: 4.3  Name of Provider Notified: Marcene Brawn  Orders Received? Or Actions Taken?: Orders Received - See Orders for details

## 2018-12-12 NOTE — ED Notes (Signed)
Critical lactic acid   KS, PA informed

## 2018-12-12 NOTE — Progress Notes (Signed)
Pharmacy Antibiotic Note  Pamela Long is a 48 y.o. female admitted on 12/12/2018 with pneumonia.  Pharmacy has been consulted for Levaquin dosing.  Patient has listed allergy of hives with penicillins and no history of use of cephalosporins  Plan: Levaquin 750 mg IV every 24 hours. Monitor labs, c/s, and patient improvement.  Height: 5\' 5"  (165.1 cm) Weight: 164 lb (74.4 kg) IBW/kg (Calculated) : 57  Temp (24hrs), Avg:103 F (39.4 C), Min:103 F (39.4 C), Max:103 F (39.4 C)  Recent Labs  Lab 12/10/18 0759  WBC 0.7*  CREATININE 0.54    Estimated Creatinine Clearance: 85.9 mL/min (by C-G formula based on SCr of 0.54 mg/dL).    Allergies  Allergen Reactions  . Corn-Containing Products Other (See Comments)    Headache and GI upset  . Salagen [Pilocarpine] Other (See Comments)    Can cause liver failure   . Tape Itching and Other (See Comments)    Depending on the adhesive-blistering occurs  . Amoxicillin Hives and Other (See Comments)    Rash only DID THE REACTION INVOLVE: Swelling of the face/tongue/throat, SOB, or low BP? Sudden or severe rash/hives, skin peeling, or the inside of the mouth or nose?  Did it require medical treatment?  When did it last happen? If all above answers are "NO", may proceed with cephalosporin use.   . Caffeine Diarrhea, Nausea Only, Palpitations and Other (See Comments)    Headache  . Tetanus Toxoids Swelling and Other (See Comments)    Local reaction    Antimicrobials this admission: Levaquin 7/4 >>     Dose adjustments this admission: N/A  Microbiology results: 7/4 BCx: pending   Thank you for allowing pharmacy to be a part of this patient's care.  Ramond Craver 12/12/2018 4:13 PM

## 2018-12-12 NOTE — ED Provider Notes (Signed)
[facility] EMERGENCY DEPARTMENT Provider Note   CSN: [id number] Arrival date & time: 12/12/18  1531     History   Chief Complaint Chief Complaint  Patient presents with   Fever    HPI NOVALEIGH KOHLMAN is a 48 y.o. female.     The history is provided by the patient. No language interpreter was used.  Fever Max temp prior to arrival:  103 Temp source:  Oral Severity:  Moderate Onset quality:  Gradual Duration:  1 day Timing:  Constant Progression:  Worsening Chronicity:  New Relieved by:  Nothing Worsened by:  Nothing Ineffective treatments:  None tried Associated symptoms: no vomiting   Risk factors: no contaminated food     Past Medical History:  Diagnosis Date   Anemia    Anxiety    Asthma    Breast cancer ([place])    Left, 2017   Depression    GERD (gastroesophageal reflux disease)    Migraine    OAB (overactive bladder)    Seasonal allergies     Patient Active Problem List   Diagnosis Date Noted   Obstructive jaundice 09/21/2018   Goals of care, counseling/discussion 09/17/2018   Abdominal pain 09/09/2018   RUQ pain 07/08/2018   Metastatic breast cancer ([place]) 07/02/2018   Liver mass 06/27/2018   GERD (gastroesophageal reflux disease) 04/02/2018   Rectal bleeding 04/02/2018   Constipation 04/02/2018   Acquired absence of left breast 08/01/2017   OAB (overactive bladder) 07/31/2017   Vitamin D deficiency 02/26/2017   Prediabetes 02/26/2017   HLD (hyperlipidemia) 02/26/2017   Asthma 02/12/2017   Depression 02/12/2017   Migraines 02/12/2017   Arthralgia 02/12/2017   Sleep-disordered breathing 02/12/2017   Positive ANA (antinuclear antibody) 02/12/2017   Angular cheilitis 02/12/2017   Class 2 obesity due to excess calories without serious comorbidity with body mass index (BMI) of 35.0 to 35.9 in adult 02/12/2017   Malignant neoplasm of central portion of left breast in female, estrogen receptor positive ([place])  01/05/2017    Past Surgical History:  Procedure Laterality Date   ABDOMINAL HYSTERECTOMY     breast cancer   ANKLE RECONSTRUCTION Right 1989   BILIARY STENT PLACEMENT N/A 09/22/2018   Procedure: BILIARY STENT PLACEMENT;  Surgeon: Rogene Houston, MD;  Location: [facility];  Service: Endoscopy;  Laterality: N/A;   BREAST SURGERY     COLONOSCOPY WITH PROPOFOL N/A 08/06/2018   Procedure: COLONOSCOPY WITH PROPOFOL;  Surgeon: Danie Binder, MD;  Location: [facility] ENDO SUITE;  Service: Endoscopy;  Laterality: N/A;  1:30pm   ERCP N/A 09/22/2018   Procedure: ENDOSCOPIC RETROGRADE CHOLANGIOPANCREATOGRAPHY (ERCP);  Surgeon: Rogene Houston, MD;  Location: [facility] END[facility];  Service: Endoscopy;  Laterality: N/A;   FOREIGN BODY REMOVAL N/A 08/29/2017   from lip   KNEE SURGERY Right 1989   bone spur   MASTECTOMY  2017   bil mastectomies   SINUSOTOMY       OB History    Gravida  1   Para  1   Term  1   Preterm      AB      Living        SAB      TAB      Ectopic      Multiple      Live Births               Home Medications    Prior to Admission medications   Medication Sig Start  Date End Date Taking? Authorizing Provider  b complex vitamins capsule Take 1 capsule by mouth daily.    [provider]  Cholecalciferol (VITAMIN D3 GUMMIES) 25 MCG (1000 UT) CHEW Chew 2,000-4,000 Units by mouth daily.    [provider]  docusate sodium (COLACE) 100 MG capsule Take 1 capsule (100 mg total) by mouth 2 (two) times daily as needed for mild constipation. 09/10/18 09/10/19  Barton Dubois, MD  escitalopram (LEXAPRO) 20 MG tablet Take 1 tablet (20 mg total) by mouth daily. 02/12/17   Raylene Everts, MD  Lysine 1000 MG TABS Take 1,000 mg by mouth daily.    [provider]  magic mouthwash w/lidocaine SOLN Take 5 mLs by mouth 4 (four) times daily as needed for mouth pain. 06/15/24   Delora Fuel, MD  magnesium oxide (MAG-OX) 400 MG tablet TAKE 1 TABLET  BY MOUTH EVERY DAY Patient taking differently: Take 400 mg by mouth daily.  12/07/18   Lockamy, Randi L, NP-C  nystatin (MYCOSTATIN) 100000 UNIT/ML suspension Take 5 mLs (500,000 Units total) by mouth 4 (four) times daily. Swish and gargle - then either spit or swallow 9/48/54   Delora Fuel, MD  ondansetron (ZOFRAN-ODT) 8 MG disintegrating tablet TAKE 1 TABLET (8 MG TOTAL) BY MOUTH EVERY 8 (EIGHT) HOURS AS NEEDED FOR NAUSEA OR VOMITING. 11/30/18   Derek Jack, MD  oxyCODONE-acetaminophen (PERCOCET) 5-325 MG tablet Take 1 tablet by mouth every 4 (four) hours as needed for moderate pain. 12/04/01   Delora Fuel, MD  oxyCODONE-acetaminophen (PERCOCET) 5-325 MG tablet Take 1 tablet by mouth every 4 (four) hours as needed for moderate pain. Patient not taking: Reported on 5/0/0938 1/82/99   Delora Fuel, MD  PACLitaxel (TAXOL IV) Inject into the vein once a week.     [provider]  pantoprazole (PROTONIX) 40 MG tablet TAKE 1 TABLET BY MOUTH EVERY DAY BEFORE BREAKFAST Patient taking differently: Take 40 mg by mouth daily.  11/19/18   Annitta Needs, NP  Polyvinyl Alcohol-Povidone PF (REFRESH) 1.4-0.6 % SOLN Place 1 drop into both eyes as needed (for dry eyes).     [provider]  potassium chloride (K-DUR) 10 MEQ tablet Take 2 tablets (20 mEq total) by mouth daily. 12/03/18   Derek Jack, MD  prochlorperazine (COMPAZINE) 10 MG tablet Take 1 tablet (10 mg total) by mouth every 6 (six) hours as needed for nausea or vomiting. 09/18/18   Derek Jack, MD  promethazine (PHENERGAN) 25 MG tablet Take 1 tablet (25 mg total) by mouth every 6 (six) hours as needed for nausea or vomiting. 10/02/18   Derek Jack, MD  rizatriptan (MAXALT-MLT) 10 MG disintegrating tablet Take 10 mg by mouth every 2 (two) hours as needed for migraine.     [provider]  temazepam (RESTORIL) 15 MG capsule Take 1 capsule (15 mg total) by mouth at bedtime as needed for sleep.  12/03/18   Derek Jack, MD  traMADol (ULTRAM) 50 MG tablet Take 1 tablet (50 mg total) by mouth 2 (two) times daily. 08/19/18   Derek Jack, MD  zolpidem (AMBIEN) 10 MG tablet Take 1 tablet (10 mg total) by mouth at bedtime as needed for up to 30 days for sleep. Patient not taking: Reported on 12/10/2018 10/09/18 12/10/18  Derek Jack, MD    Family History Family History  Problem Relation Age of Onset   Arthritis Mother    COPD Mother    Depression Mother    Diabetes Mother  Kidney disease Father    Heart disease Father 53   Drug abuse Father    Hypertension Father    Diabetes Daughter    Hashimoto's thyroiditis Daughter    Irritable bowel syndrome Daughter    Autism spectrum disorder Daughter    Early death Maternal Grandmother        drowned   Early death Maternal Grandfather    Heart disease Maternal Grandfather    Heart disease Paternal Grandfather    Breast cancer Maternal Aunt    Breast cancer Cousin    Lung cancer Maternal Aunt    Lung cancer Maternal Uncle    Colon cancer Neg Hx     Social History Social History   Tobacco Use   Smoking status: Never Smoker   Smokeless tobacco: Never Used  Substance Use Topics   Alcohol use: No   Drug use: No     Allergies   Corn-containing products, Salagen [pilocarpine], Tape, Amoxicillin, Caffeine, and Tetanus toxoids   Review of Systems Review of Systems  Constitutional: Positive for fever.  Gastrointestinal: Negative for vomiting.  All other systems reviewed and are negative.    Physical Exam Updated Vital Signs BP (!) 92/53    Pulse (!) 129    Temp (!) 103 F (39.4 C) (Oral)    Resp 20    Ht 5\' 5"  (1.651 m)    Wt 74.4 kg    SpO2 95%    BMI 27.29 kg/m   Physical Exam Vitals signs and nursing note reviewed.  Constitutional:      Appearance: She is well-developed. She is ill-appearing and toxic-appearing.  HENT:     Head: Normocephalic.     Right Ear: Tympanic  membrane normal.     Left Ear: Tympanic membrane normal.     Nose: Nose normal.     Mouth/Throat:     Mouth: Mucous membranes are moist.  Neck:     Musculoskeletal: Normal range of motion.  Cardiovascular:     Rate and Rhythm: Tachycardia present.  Pulmonary:     Effort: Pulmonary effort is normal.  Abdominal:     General: Abdomen is flat. There is no distension.  Musculoskeletal: Normal range of motion.  Skin:    General: Skin is warm.  Neurological:     Mental Status: She is alert and oriented to person, place, and time.  Psychiatric:        Mood and Affect: Mood normal.      ED Treatments / Results  Labs (all labs ordered are listed, but only abnormal results are displayed) Labs Reviewed  COMPREHENSIVE METABOLIC PANEL - Abnormal; Notable for the following components:      Result Value   Sodium 133 (*)    Potassium 3.4 (*)    CO2 21 (*)    Calcium 8.3 (*)    Total Protein 5.7 (*)    Albumin 2.4 (*)    Alkaline Phosphatase 135 (*)    Total Bilirubin 2.4 (*)    All other components within normal limits  LACTIC ACID, PLASMA - Abnormal; Notable for the following components:   Lactic Acid, Venous 4.3 (*)    All other components within normal limits  CBC WITH DIFFERENTIAL/PLATELET - Abnormal; Notable for the following components:   WBC 1.2 (*)    RBC 3.50 (*)    Hemoglobin 9.7 (*)    HCT 29.8 (*)    RDW 16.1 (*)    Platelets 69 (*)    nRBC 8.9 (*)  Neutro Abs 0.0 (*)    Lymphs Abs 0.4 (*)    All other components within normal limits  PROTIME-INR - Abnormal; Notable for the following components:   Prothrombin Time 19.4 (*)    INR 1.7 (*)    All other components within normal limits  APTT - Abnormal; Notable for the following components:   aPTT 45 (*)    All other components within normal limits  CULTURE, BLOOD (ROUTINE X 2)  SARS CORONAVIRUS 2 (HOSPITAL ORDER, [place] [facility])  LACTIC ACID, PLASMA  URINALYSIS, ROUTINE W REFLEX  MICROSCOPIC  PROCALCITONIN  URINALYSIS, COMPLETE (UACMP) WITH MICROSCOPIC    EKG None  Radiology Dg Chest Portable 1 View  Result Date: 12/12/2018 CLINICAL DATA:  Shortness of breath EXAM: PORTABLE CHEST 1 VIEW COMPARISON:  September 18, 2018 FINDINGS: The heart size and mediastinal contours are stable. Right central venous line is unchanged. There is patchy opacity of left lung base with left pleural effusion. The right lung is clear. The visualized skeletal structures are unremarkable. IMPRESSION: Patchy consolidation of left lung base pneumonia is not excluded. Left pleural effusion is noted. Electronically Signed   By: Abelardo Diesel M.D.   On: 12/12/2018 16:54    Procedures .Critical Care Performed by: Fransico Meadow, PA-C Authorized by: Fransico Meadow, PA-C   Critical care provider statement:    Critical care time (minutes):  45   Critical care start time:  12/12/2018 4:00 PM   Critical care end time:  12/12/2018 5:11 PM   Critical care time was exclusive of:  Separately billable procedures and treating other patients   Critical care was necessary to treat or prevent imminent or life-threatening deterioration of the following conditions:  Dehydration and sepsis   Critical care was time spent personally by me on the following activities:  Development of treatment plan with patient or surrogate, blood draw for specimens, interpretation of cardiac output measurements, review of old charts, re-evaluation of patient's condition, pulse oximetry, ordering and review of radiographic studies, ordering and review of laboratory studies and ordering and performing treatments and interventions   (including critical care time)  Medications Ordered in ED Medications  levofloxacin (LEVAQUIN) IVPB 750 mg (750 mg Intravenous New Bag/Given 12/12/18 1617)  sodium chloride 0.9 % bolus 1,000 mL (1,000 mLs Intravenous New Bag/Given 12/12/18 1607)    And  sodium chloride 0.9 % bolus 1,000 mL (1,000 mLs  Intravenous New Bag/Given 12/12/18 1614)    And  sodium chloride 0.9 % bolus 250 mL (has no administration in time range)  levofloxacin (LEVAQUIN) IVPB 750 mg (has no administration in time range)  acetaminophen (TYLENOL) tablet 650 mg (has no administration in time range)  sodium chloride flush (NS) 0.9 % injection 3 mL (3 mLs Intravenous Given 12/12/18 1632)     Initial Impression / Assessment and Plan / ED Course  I have reviewed the triage vital signs and the nursing notes.  Pertinent labs & imaging results that were available during my care of the patient were reviewed by me and considered in my medical decision making (see chart for details).        Sepsis protocol initiated.  Pt given Iv fluid bolus and levaquin. Pt's chest xray returned and shows pneumonia.  Pt has wbc count of 1.2  Covid is negative    I spoke to Dr. Nehemiah Settle who advised to await covid results.   Dr. Maudie Mercury notified when covid returned negative.  He will see the pt  for admission  Final Clinical Impressions(s) / ED Diagnoses   Final diagnoses:  Sepsis, due to unspecified organism, unspecified whether acute organ dysfunction present [facility])  Community acquired pneumonia of left lung, unspecified part of lung  Metastatic breast cancer [facility])    ED Discharge Orders    None       Sidney Ace 12/12/18 2046    Milton Ferguson, MD 12/13/18 1017

## 2018-12-12 NOTE — H&P (Signed)
TRH H&P    Patient Demographics:    Pamela Long, is a 48 y.o. female  MRN: [id number]  DOB - Feb 03, 1971  Admit Date - 12/12/2018  Referring MD/NP/PA: Caryl Ada  Outpatient Primary MD for the patient is Doree Albee, MD  Patient coming from:  home  Chief complaint-  Not feeling well.    HPI:    Pamela Long  is a 48 y.o. female,w metastatic breast cancer, anemia, Gerd, Anxiety/ Depression, apparently present with not feeling well for the past 2 days. + cough with white sputum. Pt has had subjective fever " feeling hot" for the past 2 days.  Pt has slight dyspnea, pt denies cp, palp, n/v, abd pain, diarrhea, brbpr, black stool, dysuria, hematuria.  Pt was supposed to have more chemo this past week but did not due to low Wbc.   In ED,  T 103.0  P 127  R 20  Bp 112/34  Pox 99% on o2 [place] Wt 74.4 kg  CXR IMPRESSION: Patchy consolidation of left lung base pneumonia is not excluded. Left pleural effusion is noted.   Na 133, K 3.4 Bun 11, Creatinine 0.93 Ast 40, Alt 22, Alk phos 135, T. Bili 2.4 Alb 2.4 Wbc 1.2, hgb 9.7, Plt 69 INR 1.7 Pro calcitonin 1.69 Lactic acid 4.3-> 3.6  covid 19 negative   Pt tx with levaquin iv in ED,   Pt will be admitted for sepsis (fever, tachycardia, leukopenia, elevated lactic acid), secondary to likely Hcap,  Also febrile neutropenia.            Review of systems:    In addition to the HPI above,   No Headache, No changes with Vision or hearing, No problems swallowing food or Liquids, No Chest pain No Abdominal pain, No Nausea or Vomiting, bowel movements are regular, No Blood in stool or Urine, No dysuria, No new skin rashes or bruises, No new joints pains-aches,  No new weakness, tingling, numbness in any extremity, No recent weight gain or loss, No polyuria, polydypsia or polyphagia, No significant Mental Stressors.  All other  systems reviewed and are negative.    Past History of the following :    Past Medical History:  Diagnosis Date  . Anemia   . Anxiety   . Asthma   . Breast cancer ([place])    Left, 2017  . Depression   . GERD (gastroesophageal reflux disease)   . Migraine   . OAB (overactive bladder)   . Seasonal allergies       Past Surgical History:  Procedure Laterality Date  . ABDOMINAL HYSTERECTOMY     breast cancer  . ANKLE RECONSTRUCTION Right 1989  . BILIARY STENT PLACEMENT N/A 09/22/2018   Procedure: BILIARY STENT PLACEMENT;  Surgeon: Rogene Houston, MD;  [facility];  Service: Endoscopy;  Laterality: N/A;  . BREAST SURGERY    . COLONOSCOPY WITH PROPOFOL N/A 08/06/2018   Procedure: COLONOSCOPY WITH PROPOFOL;  Surgeon: Danie Binder, MD;  Location[facility];  Service: Endoscopy;  Laterality: N/A;  1:30pm  . ERCP N/A 09/22/2018   Procedure: ENDOSCOPIC RETROGRADE CHOLANGIOPANCREATOGRAPHY (ERCP);  Surgeon: Rogene Houston, MD;  Location: [facility];  Service: Endoscopy;  Laterality: N/A;  . FOREIGN BODY REMOVAL N/A 08/29/2017   from lip  . KNEE SURGERY Right 1989   bone spur  . MASTECTOMY  2017   bil mastectomies  . SINUSOTOMY        Social History:      Social History   Tobacco Use  . Smoking status: Never Smoker  . Smokeless tobacco: Never Used  Substance Use Topics  . Alcohol use: No       Family History :     Family History  Problem Relation Age of Onset  . Arthritis Mother   . COPD Mother   . Depression Mother   . Diabetes Mother   . Kidney disease Father   . Heart disease Father 68  . Drug abuse Father   . Hypertension Father   . Diabetes Daughter   . Hashimoto's thyroiditis Daughter   . Irritable bowel syndrome Daughter   . Autism spectrum disorder Daughter   . Early death Maternal Grandmother        drowned  . Early death Maternal Grandfather   . Heart disease Maternal Grandfather   . Heart disease Paternal Grandfather   .  Breast cancer Maternal Aunt   . Breast cancer Cousin   . Lung cancer Maternal Aunt   . Lung cancer Maternal Uncle   . Colon cancer Neg Hx        Home Medications:   Prior to Admission medications   Medication Sig Start Date End Date Taking? Authorizing Provider  b complex vitamins capsule Take 1 capsule by mouth daily.   Yes [provider]  Cholecalciferol (VITAMIN D3 GUMMIES) 25 MCG (1000 UT) CHEW Chew 2,000-4,000 Units by mouth daily.   Yes [provider]  docusate sodium (COLACE) 100 MG capsule Take 1 capsule (100 mg total) by mouth 2 (two) times daily as needed for mild constipation. 09/10/18 09/10/19 Yes Barton Dubois, MD  escitalopram (LEXAPRO) 20 MG tablet Take 1 tablet (20 mg total) by mouth daily. 02/12/17  Yes Raylene Everts, MD  Lysine 1000 MG TABS Take 1,000 mg by mouth daily.   Yes [provider]  magic mouthwash w/lidocaine SOLN Take 5 mLs by mouth 4 (four) times daily as needed for mouth pain. 3/66/44  Yes Delora Fuel, MD  magnesium oxide (MAG-OX) 400 MG tablet TAKE 1 TABLET BY MOUTH EVERY DAY Patient taking differently: Take 400 mg by mouth daily.  12/07/18  Yes Lockamy, Randi L, NP-C  nystatin (MYCOSTATIN) 100000 UNIT/ML suspension Take 5 mLs (500,000 Units total) by mouth 4 (four) times daily. Swish and gargle - then either spit or swallow 0/34/74  Yes Delora Fuel, MD  ondansetron (ZOFRAN-ODT) 8 MG disintegrating tablet TAKE 1 TABLET (8 MG TOTAL) BY MOUTH EVERY 8 (EIGHT) HOURS AS NEEDED FOR NAUSEA OR VOMITING. 11/30/18  Yes Derek Jack, MD  oxyCODONE-acetaminophen (PERCOCET) 5-325 MG tablet Take 1 tablet by mouth every 4 (four) hours as needed for moderate pain. 2/59/56  Yes Delora Fuel, MD  PACLitaxel (TAXOL IV) Inject into the vein once a week.    Yes [provider]  pantoprazole (PROTONIX) 40 MG tablet TAKE 1 TABLET BY MOUTH EVERY DAY BEFORE BREAKFAST Patient taking differently: Take 40 mg by mouth daily.  11/19/18  Yes  Annitta Needs, NP  Polyvinyl Alcohol-Povidone PF (  REFRESH) 1.4-0.6 % SOLN Place 1 drop into both eyes as needed (for dry eyes).    Yes [provider]  potassium chloride (K-DUR) 10 MEQ tablet Take 2 tablets (20 mEq total) by mouth daily. 12/03/18  Yes Derek Jack, MD  prochlorperazine (COMPAZINE) 10 MG tablet Take 1 tablet (10 mg total) by mouth every 6 (six) hours as needed for nausea or vomiting. 09/18/18  Yes Derek Jack, MD  rizatriptan (MAXALT-MLT) 10 MG disintegrating tablet Take 10 mg by mouth every 2 (two) hours as needed for migraine.    Yes [provider]  temazepam (RESTORIL) 15 MG capsule Take 1 capsule (15 mg total) by mouth at bedtime as needed for sleep. 12/03/18  Yes Derek Jack, MD  zolpidem (AMBIEN) 10 MG tablet Take 1 tablet (10 mg total) by mouth at bedtime as needed for up to 30 days for sleep. 10/09/18 12/12/18 Yes Derek Jack, MD  oxyCODONE-acetaminophen (PERCOCET) 5-325 MG tablet Take 1 tablet by mouth every 4 (four) hours as needed for moderate pain. Patient not taking: Reported on 08/11/9177 1/50/56   Delora Fuel, MD  promethazine (PHENERGAN) 25 MG tablet Take 1 tablet (25 mg total) by mouth every 6 (six) hours as needed for nausea or vomiting. 10/02/18   Derek Jack, MD     Allergies:     Allergies  Allergen Reactions  . Corn-Containing Products Other (See Comments)    Headache and GI upset  . Salagen [Pilocarpine] Other (See Comments)    Can cause liver failure   . Tape Itching and Other (See Comments)    Depending on the adhesive-blistering occurs  . Amoxicillin Hives and Other (See Comments)    Rash only DID THE REACTION INVOLVE: Swelling of the face/tongue/throat, SOB, or low BP? Sudden or severe rash/hives, skin peeling, or the inside of the mouth or nose?  Did it require medical treatment?  When did it last happen? If all above answers are "NO", may proceed with cephalosporin use.   .  Caffeine Diarrhea, Nausea Only, Palpitations and Other (See Comments)    Headache  . Tetanus Toxoids Swelling and Other (See Comments)    Local reaction     Physical Exam:   Vitals  Blood pressure 98/70, pulse 97, temperature 99.4 F (37.4 C), temperature source Oral, resp. rate 18, height '5\' 5"'  (1.651 m), weight 74.4 kg, SpO2 96 %.  1.  General: aoxo3  2. Psychiatric: euthymic  3. Neurologic: cn2-12 intact, reflexes 2+ symmetric, diffuse with no clonus,  Motor 5/5 in all 4 ext  4. HEENMT:  Anicteric, pupils 1.49m symmetric, direct, consensual, intact Neck: no jvd, no bruit  5. Respiratory : Slight crackles left lung base, no wheezing  6. Cardiovascular : Tachy s1, s2,   7. Gastrointestinal:  Abd: soft, nt, nd, +bs  8. Skin:  Ext: no c/c/e,  No rash  9.Musculoskeletal:  Good ROM,  No adenopathy     Data Review:    CBC Recent Labs  Lab 12/10/18 0759 12/12/18 1551  WBC 0.7* 1.2*  HGB 8.5* 9.7*  HCT 26.7* 29.8*  PLT 73* 69*  MCV 86.7 85.1  MCH 27.6 27.7  MCHC 31.8 32.6  RDW 15.8* 16.1*  LYMPHSABS 0.5* 0.4*  MONOABS 0.0* 0.7  EOSABS 0.1 0.0  BASOSABS 0.0 0.0   ------------------------------------------------------------------------------------------------------------------  Results for orders placed or performed during the hospital encounter of 12/12/18 (from the past 48 hour(s))  Lactic acid, plasma     Status: Abnormal   Collection Time: 12/12/18  3:50 PM  Result Value Ref Range   Lactic Acid, Venous 4.3 (HH) 0.5 - 1.9 mmol/L    Comment: CRITICAL RESULT CALLED TO, READ BACK BY AND VERIFIED WITH: Asencion Noble, RN '@[id number]'  07/04/2020KAY Performed at [facility], [address], [place], [place] [address]   Culture, blood (Routine x 2)     Status: None (Preliminary result)   Collection Time: 12/12/18  3:50 PM   Specimen: Site Not Specified; Blood  Result Value Ref Range   Specimen Description      SITE NOT SPECIFIED BOTTLES DRAWN AEROBIC AND  ANAEROBIC   Special Requests      Blood Culture adequate volume Performed at [facility], [address], [place], [place] [address]    Culture PENDING    Report Status PENDING   Comprehensive metabolic panel     Status: Abnormal   Collection Time: 12/12/18  3:51 PM  Result Value Ref Range   Sodium 133 (L) 135 - 145 mmol/L   Potassium 3.4 (L) 3.5 - 5.1 mmol/L   Chloride 98 98 - 111 mmol/L   CO2 21 (L) 22 - 32 mmol/L   Glucose, Bld 99 70 - 99 mg/dL   BUN 11 6 - 20 mg/dL   Creatinine, Ser 0.93 0.44 - 1.00 mg/dL   Calcium 8.3 (L) 8.9 - 10.3 mg/dL   Total Protein 5.7 (L) 6.5 - 8.1 g/dL   Albumin 2.4 (L) 3.5 - 5.0 g/dL   AST 40 15 - 41 U/L   ALT 22 0 - 44 U/L   Alkaline Phosphatase 135 (H) 38 - 126 U/L   Total Bilirubin 2.4 (H) 0.3 - 1.2 mg/dL   GFR calc non Af Amer >60 >60 mL/min   GFR calc Af Amer >60 >60 mL/min   Anion gap 14 5 - 15    Comment: Performed at [facility], [address], [place], [place] [address]  CBC with Differential     Status: Abnormal   Collection Time: 12/12/18  3:51 PM  Result Value Ref Range   WBC 1.2 (LL) 4.0 - 10.5 K/uL    Comment: REPEATED TO VERIFY WHITE COUNT CONFIRMED ON SMEAR THIS CRITICAL RESULT HAS VERIFIED AND BEEN CALLED TO TUTTLE. BY LATISHA HENDERSON ON 07 04 2020 AT 3785, AND HAS BEEN READ BACK. CRITICAL RESULT VERIFIED    RBC 3.50 (L) 3.87 - 5.11 MIL/uL   Hemoglobin 9.7 (L) 12.0 - 15.0 g/dL   HCT 29.8 (L) 36.0 - 46.0 %   MCV 85.1 80.0 - 100.0 fL   MCH 27.7 26.0 - 34.0 pg   MCHC 32.6 30.0 - 36.0 g/dL   RDW 16.1 (H) 11.5 - 15.5 %   Platelets 69 (L) 150 - 400 K/uL    Comment: REPEATED TO VERIFY PLATELET COUNT CONFIRMED BY SMEAR SPECIMEN CHECKED FOR CLOTS Immature Platelet Fraction may be clinically indicated, consider ordering this additional test [id number]    nRBC 8.9 (H) 0.0 - 0.2 %   Neutrophils Relative % 3 %   Neutro Abs 0.0 (L) 1.7 - 7.7 K/uL   Lymphocytes Relative 36 %   Lymphs Abs 0.4 (L) 0.7 - 4.0 K/uL   Monocytes  Relative 55 %   Monocytes Absolute 0.7 0.1 - 1.0 K/uL   Eosinophils Relative 1 %   Eosinophils Absolute 0.0 0.0 - 0.5 K/uL   Basophils Relative 1 %   Basophils Absolute 0.0 0.0 - 0.1 K/uL   WBC Morphology WHITE CELLS CONFIRMED BY SMEAR    Immature Granulocytes  4 %   Abs Immature Granulocytes 0.05 0.00 - 0.07 K/uL    Comment: Performed at [facility], [address], [place], [place] [address]  Protime-INR     Status: Abnormal   Collection Time: 12/12/18  3:51 PM  Result Value Ref Range   Prothrombin Time 19.4 (H) 11.4 - 15.2 seconds   INR 1.7 (H) 0.8 - 1.2    Comment: (NOTE) INR goal varies based on device and disease states. Performed at [facility], [address], [place], [place] [address]   Procalcitonin     Status: None   Collection Time: 12/12/18  3:51 PM  Result Value Ref Range   Procalcitonin 1.69 ng/mL    Comment:        Interpretation: PCT > 0.5 ng/mL and <= 2 ng/mL: Systemic infection (sepsis) is possible, but other conditions are known to elevate PCT as well. (NOTE)       Sepsis PCT Algorithm           Lower Respiratory Tract                                      Infection PCT Algorithm    ----------------------------     ----------------------------         PCT < 0.25 ng/mL                PCT < 0.10 ng/mL         Strongly encourage             Strongly discourage   discontinuation of antibiotics    initiation of antibiotics    ----------------------------     -----------------------------       PCT 0.25 - 0.50 ng/mL            PCT 0.10 - 0.25 ng/mL               OR       >80% decrease in PCT            Discourage initiation of                                            antibiotics      Encourage discontinuation           of antibiotics    ----------------------------     -----------------------------         PCT >= 0.50 ng/mL              PCT 0.26 - 0.50 ng/mL                AND       <80% decrease in PCT             Encourage initiation of                                              antibiotics       Encourage continuation           of antibiotics    ----------------------------     -----------------------------        PCT >= 0.50 ng/mL  PCT > 0.50 ng/mL               AND         increase in PCT                  Strongly encourage                                      initiation of antibiotics    Strongly encourage escalation           of antibiotics                                     -----------------------------                                           PCT <= 0.25 ng/mL                                                 OR                                        > 80% decrease in PCT                                     Discontinue / Do not initiate                                             antibiotics Performed at [facility], [address], [place], [place] [address]   APTT     Status: Abnormal   Collection Time: 12/12/18  3:51 PM  Result Value Ref Range   aPTT 45 (H) 24 - 36 seconds    Comment:        IF BASELINE aPTT IS ELEVATED, SUGGEST PATIENT RISK ASSESSMENT BE USED TO DETERMINE APPROPRIATE ANTICOAGULANT THERAPY. Performed at [facility], [address], [place], [place] [address]   D-dimer, quantitative (not at [facility])     Status: Abnormal   Collection Time: 12/12/18  3:51 PM  Result Value Ref Range   D-Dimer, Quant 5.84 (H) 0.00 - 0.50 ug/mL-FEU    Comment: (NOTE) At the manufacturer cut-off of 0.50 ug/mL FEU, this assay has been documented to exclude PE with a sensitivity and negative predictive value of 97 to 99%.  At this time, this assay has not been approved by the FDA to exclude DVT/VTE. Results should be correlated with clinical presentation. Performed at [facility], [address], [place], [place] [address]   Lactic acid, plasma     Status: Abnormal   Collection Time: 12/12/18  5:37 PM  Result Value Ref Range   Lactic Acid, Venous 3.6 (HH) 0.5 - 1.9 mmol/L    Comment: CRITICAL RESULT  CALLED TO, READ BACK BY AND VERIFIED  WITH: TUTTLE,A @ 4098 ON 12/12/18 BY JUW Performed at [facility], [address] [place], [place] [address]   Lactate dehydrogenase     Status: None   Collection Time: 12/12/18  5:37 PM  Result Value Ref Range   LDH 173 98 - 192 U/L    Comment: Performed at [facility], [address], [place], [place] [address]  Technologist smear review     Status: None   Collection Time: 12/12/18  5:37 PM  Result Value Ref Range   Tech Review NO SCHISTOCYTES SEEN     Comment: OVAL MACROCYTES POLYCHROMASIA PRESENT Performed at [facility], [address], [place], [place] [address]   SARS Coronavirus 2 (CEPHEID- Performed in [facility] hospital lab), Hosp Order     Status: None   Collection Time: 12/12/18  6:00 PM   Specimen: Nasopharyngeal Swab  Result Value Ref Range   SARS Coronavirus 2 NEGATIVE NEGATIVE    Comment: (NOTE) If result is NEGATIVE SARS-CoV-2 target nucleic acids are NOT DETECTED. The SARS-CoV-2 RNA is generally detectable in upper and lower  respiratory specimens during the acute phase of infection. The lowest  concentration of SARS-CoV-2 viral copies this assay can detect is 250  copies / mL. A negative result does not preclude SARS-CoV-2 infection  and should not be used as the sole basis for treatment or other  patient management decisions.  A negative result may occur with  improper specimen collection / handling, submission of specimen other  than nasopharyngeal swab, presence of viral mutation(s) within the  areas targeted by this assay, and inadequate number of viral copies  (<250 copies / mL). A negative result must be combined with clinical  observations, patient history, and epidemiological information. If result is POSITIVE SARS-CoV-2 target nucleic acids are DETECTED. The SARS-CoV-2 RNA is generally detectable in upper and lower  respiratory specimens dur ing the acute phase of infection.  Positive  results are indicative of  active infection with SARS-CoV-2.  Clinical  correlation with patient history and other diagnostic information is  necessary to determine patient infection status.  Positive results do  not rule out bacterial infection or co-infection with other viruses. If result is PRESUMPTIVE POSTIVE SARS-CoV-2 nucleic acids MAY BE PRESENT.   A presumptive positive result was obtained on the submitted specimen  and confirmed on repeat testing.  While 2019 novel coronavirus  (SARS-CoV-2) nucleic acids may be present in the submitted sample  additional confirmatory testing may be necessary for epidemiological  and / or clinical management purposes  to differentiate between  SARS-CoV-2 and other Sarbecovirus currently known to infect humans.  If clinically indicated additional testing with an alternate test  methodology [phone]) is advised. The SARS-CoV-2 RNA is generally  detectable in upper and lower respiratory sp ecimens during the acute  phase of infection. The expected result is Negative. Fact Sheet for Patients:  StrictlyIdeas.no Fact Sheet for Healthcare Providers: BankingDealers.co.za This test is not yet approved or cleared by the [place] FDA and has been authorized for detection and/or diagnosis of SARS-CoV-2 by FDA under an Emergency Use Authorization (EUA).  This EUA will remain in effect (meaning this test can be used) for the duration of the COVID-19 declaration under Section 564(b)(1) of the Act, 21 U.S.C. section 360bbb-3(b)(1), unless the authorization is terminated or revoked sooner. Performed at [facility], [address], [place], [place] [address]     Chemistries  Recent Labs  Lab 12/10/18 [id number] 12/12/18 1551  NA 138 133*  K 3.6 3.4*  CL 103 98  CO2 26 21*  GLUCOSE 104* 99  BUN 9 11  CREATININE 0.54 0.93  CALCIUM 8.6* 8.3*  AST 39 40  ALT 21 22  ALKPHOS 125 135*  BILITOT 1.1 2.4*    ------------------------------------------------------------------------------------------------------------------  ------------------------------------------------------------------------------------------------------------------ GFR: Estimated Creatinine Clearance: 73.9 mL/min (by C-G formula based on SCr of 0.93 mg/dL). Liver Function Tests: Recent Labs  Lab 12/10/18 0759 12/12/18 1551  AST 39 40  ALT 21 22  ALKPHOS 125 135*  BILITOT 1.1 2.4*  PROT 5.3* 5.7*  ALBUMIN 2.4* 2.4*   No results for input(s): LIPASE, AMYLASE in the last 168 hours. No results for input(s): AMMONIA in the last 168 hours. Coagulation Profile: Recent Labs  Lab 12/12/18 1551  INR 1.7*   Cardiac Enzymes: No results for input(s): CKTOTAL, CKMB, CKMBINDEX, TROPONINI in the last 168 hours. BNP (last 3 results) No results for input(s): PROBNP in the last 8760 hours. HbA1C: No results for input(s): HGBA1C in the last 72 hours. CBG: No results for input(s): GLUCAP in the last 168 hours. Lipid Profile: No results for input(s): CHOL, HDL, LDLCALC, TRIG, CHOLHDL, LDLDIRECT in the last 72 hours. Thyroid Function Tests: No results for input(s): TSH, T4TOTAL, FREET4, T3FREE, THYROIDAB in the last 72 hours. Anemia Panel: No results for input(s): VITAMINB12, FOLATE, FERRITIN, TIBC, IRON, RETICCTPCT in the last 72 hours.  --------------------------------------------------------------------------------------------------------------- Urine analysis:    Component Value Date/Time   COLORURINE AMBER (A) 09/14/2018 1351   APPEARANCEUR CLOUDY (A) 09/14/2018 1351   LABSPEC 1.025 09/14/2018 1351   PHURINE 5.0 09/14/2018 1351   GLUCOSEU NEGATIVE 09/14/2018 1351   HGBUR NEGATIVE 09/14/2018 1351   BILIRUBINUR MODERATE (A) 09/14/2018 1351   KETONESUR 5 (A) 09/14/2018 1351   PROTEINUR 100 (A) 09/14/2018 1351   NITRITE NEGATIVE 09/14/2018 1351   LEUKOCYTESUR NEGATIVE 09/14/2018 1351      Imaging Results:    Dg  Chest Portable 1 View  Result Date: 12/12/2018 CLINICAL DATA:  Shortness of breath EXAM: PORTABLE CHEST 1 VIEW COMPARISON:  September 18, 2018 FINDINGS: The heart size and mediastinal contours are stable. Right central venous line is unchanged. There is patchy opacity of left lung base with left pleural effusion. The right lung is clear. The visualized skeletal structures are unremarkable. IMPRESSION: Patchy consolidation of left lung base pneumonia is not excluded. Left pleural effusion is noted. Electronically Signed   By: Abelardo Diesel M.D.   On: 12/12/2018 16:54       Assessment & Plan:    Principal Problem:   HCAP (healthcare-associated pneumonia) Active Problems:   Asthma   Depression   Metastatic breast cancer ([place])   Febrile neutropenia (H[facility])   Hypokalemia   Hyponatremia   Severe protein-calorie malnutrition (H[facility])   Sepsis (HCC)  Sepsis (fever, tachycardia, low wbc, elevated lactic acid) secondary to Hcap Febrile neutropenia Neutropenic precautions Blood culture x2 Check urine strep antigen Check urine legionella antigen vanco iv, levaquin iv pharmacy to dose Please check w pharmacy regarding vanco consult in AM, appears only 1 dose ordered.    Tachycardia Check D dimer, if positive then CTA chest r/o PE Trop I in am Check cardiac echo  Pancyctopenia likely secondary to chemo Check LDH Check smear r/o schistocytes Check cbc in am Oncology consult requested in computer  Hypokalemia Replete Check cmp in am  Hyponatremia Hydrate with ns iv Check cmp in am  Severe protein calorie malnturition prostat 42m po bid  Metastatic breast cancer Cont magic mouth wash  Cont nystatin swish and swallow Cont Percocet prn Cont compazine prn Zofran 48m iv q6h prn n/v if compazine not effective  Gerd Cont PPI  Anxiety Cont Lexapro  Insomnia Cont Ambien at 546mpo qhs prn    DVT Prophylaxis-   Lovenox - SCDs  AM Labs Ordered, also please review Full Orders   Family Communication: Admission, patients condition and plan of care including tests being ordered have been discussed with the patient who indicate understanding and agree with the plan and Code Status.  Code Status: FULL CODE,  Contacted Adrianna her daughter to let [facility] / [facility],   Admission status:  Inpatient: Based on patients clinical presentation and evaluation of above clinical data, I have made determination that patient meets Inpatient criteria at this time.   Pt has high risk of clinical deterioration.  Pt is critically ill with febrile neutropenia, and sepsis secondary to Hcap.   Time spent in minutes : 70 minutes    JaJani Gravel.D on 12/13/2018 at 3:14 AM

## 2018-12-12 NOTE — ED Notes (Signed)
Call from daughter   Oley Balm [phone]  She would appreciate update and findings from physician

## 2018-12-12 NOTE — ED Notes (Signed)
Lactic critical   KS, PA informed

## 2018-12-12 NOTE — ED Triage Notes (Signed)
Breast Ca pt   Last tx [place]   "out and about today" per EMS w children and "gave out"  Call to EMS  here

## 2018-12-12 NOTE — ED Notes (Signed)
KS, PA to bedside

## 2018-12-12 NOTE — ED Notes (Signed)
Awaiting transfer

## 2018-12-12 NOTE — ED Notes (Signed)
Call from lab  Critical result:  WBC 1.3  KS, PA informed

## 2018-12-12 NOTE — ED Notes (Signed)
Pt reports that she missed her chemo appt due to a WBC of 0.7  She reports that she is better now, but appear intermittently confused

## 2018-12-13 ENCOUNTER — Inpatient Hospital Stay (HOSPITAL_COMMUNITY): Payer: 59

## 2018-12-13 DIAGNOSIS — I313 Pericardial effusion (noninflammatory): Secondary | ICD-10-CM

## 2018-12-13 DIAGNOSIS — J90 Pleural effusion, not elsewhere classified: Secondary | ICD-10-CM

## 2018-12-13 DIAGNOSIS — A419 Sepsis, unspecified organism: Secondary | ICD-10-CM

## 2018-12-13 DIAGNOSIS — C50919 Malignant neoplasm of unspecified site of unspecified female breast: Secondary | ICD-10-CM

## 2018-12-13 DIAGNOSIS — J9621 Acute and chronic respiratory failure with hypoxia: Secondary | ICD-10-CM

## 2018-12-13 LAB — COMPREHENSIVE METABOLIC PANEL
ALT: 21 U/L (ref 0–44)
AST: 29 U/L (ref 15–41)
Albumin: 2.2 g/dL — ABNORMAL LOW (ref 3.5–5.0)
Alkaline Phosphatase: 131 U/L — ABNORMAL HIGH (ref 38–126)
Anion gap: 10 (ref 5–15)
BUN: 13 mg/dL (ref 6–20)
CO2: 23 mmol/L (ref 22–32)
Calcium: 8 mg/dL — ABNORMAL LOW (ref 8.9–10.3)
Chloride: 103 mmol/L (ref 98–111)
Creatinine, Ser: 0.62 mg/dL (ref 0.44–1.00)
GFR calc Af Amer: >60 mL/min (ref >60)
GFR calc non Af Amer: >60 mL/min (ref >60)
Glucose, Bld: 88 mg/dL (ref 70–99)
Potassium: 3.2 mmol/L — ABNORMAL LOW (ref 3.5–5.1)
Sodium: 136 mmol/L (ref 135–145)
Total Bilirubin: 1.9 mg/dL — ABNORMAL HIGH (ref 0.3–1.2)
Total Protein: 5.5 g/dL — ABNORMAL LOW (ref 6.5–8.1)

## 2018-12-13 LAB — CBC WITH DIFFERENTIAL/PLATELET
Abs Immature Granulocytes: 0.06 10*3/uL (ref 0.00–0.07)
Basophils Absolute: 0 10*3/uL (ref 0.0–0.1)
Basophils Relative: 1 %
Eosinophils Absolute: 0 10*3/uL (ref 0.0–0.5)
Eosinophils Relative: 1 %
HCT: 24.4 % — ABNORMAL LOW (ref 36.0–46.0)
Hemoglobin: 7.9 g/dL — ABNORMAL LOW (ref 12.0–15.0)
Immature Granulocytes: 4 %
Lymphocytes Relative: 38 %
Lymphs Abs: 0.6 10*3/uL — ABNORMAL LOW (ref 0.7–4.0)
MCH: 27.3 pg (ref 26.0–34.0)
MCHC: 32.4 g/dL (ref 30.0–36.0)
MCV: 84.4 fL (ref 80.0–100.0)
Monocytes Absolute: 0.8 10*3/uL (ref 0.1–1.0)
Monocytes Relative: 49 %
Neutro Abs: 0.1 10*3/uL — ABNORMAL LOW (ref 1.7–7.7)
Neutrophils Relative %: 7 %
Platelets: 67 10*3/uL — ABNORMAL LOW (ref 150–400)
RBC: 2.89 MIL/uL — ABNORMAL LOW (ref 3.87–5.11)
RDW: 16 % — ABNORMAL HIGH (ref 11.5–15.5)
WBC: 1.6 10*3/uL — ABNORMAL LOW (ref 4.0–10.5)
nRBC: 3.8 % — ABNORMAL HIGH (ref 0.0–0.2)

## 2018-12-13 LAB — ECHOCARDIOGRAM COMPLETE
Height: 65 in
Weight: 3089.97 oz

## 2018-12-13 LAB — BRAIN NATRIURETIC PEPTIDE: B Natriuretic Peptide: 97 pg/mL (ref 0.0–100.0)

## 2018-12-13 LAB — MRSA PCR SCREENING: MRSA by PCR: NEGATIVE

## 2018-12-13 LAB — PROCALCITONIN: Procalcitonin: 2.98 ng/mL

## 2018-12-13 IMAGING — CT CT ANGIOGRAPHY CHEST
2 of 6 series · 18 of 46 positions shown · IV contrast (Isovue)
Comparison: Chest radiography [DATE].  Chest CT [DATE]

CLINICAL DATA: Positive D-dimer. Tachycardia. Metastatic breast
cancer. Shortness of breath.

EXAM:
CT ANGIOGRAPHY CHEST WITH CONTRAST
TECHNIQUE: Multidetector CT imaging of the chest was performed using the
standard protocol during bolus administration of intravenous
contrast. Multiplanar CT image reconstructions and MIPs were
obtained to evaluate the vascular anatomy.
CONTRAST:  100mL OMNIPAQUE IOHEXOL 350 MG/ML SOLN

[Series 5: 3 thins · axial · 0.76mm/px · z∈[-59,+187]mm · 15 of 270 slices shown]
[im 12/270  lung]
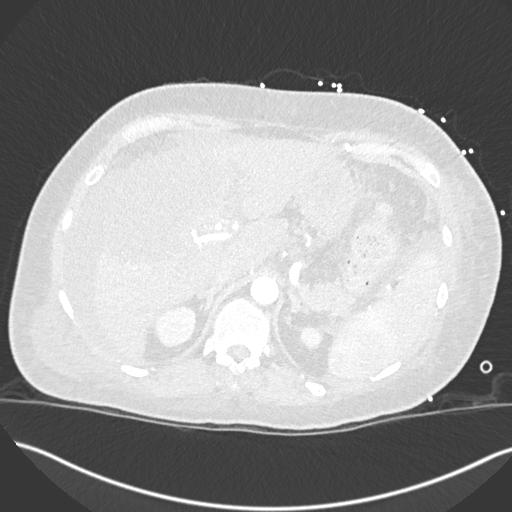
[im 36/270  soft-tissue]
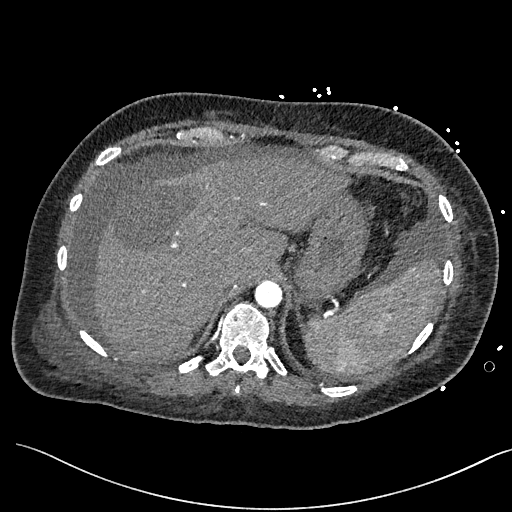
[im 47/270  lung]
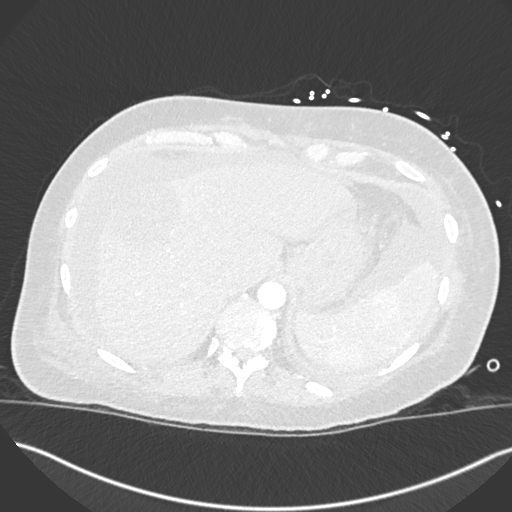
[im 71/270  soft-tissue]
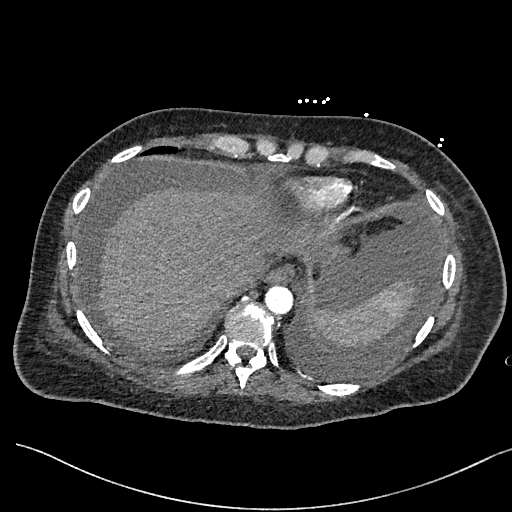
[im 82/270  lung]
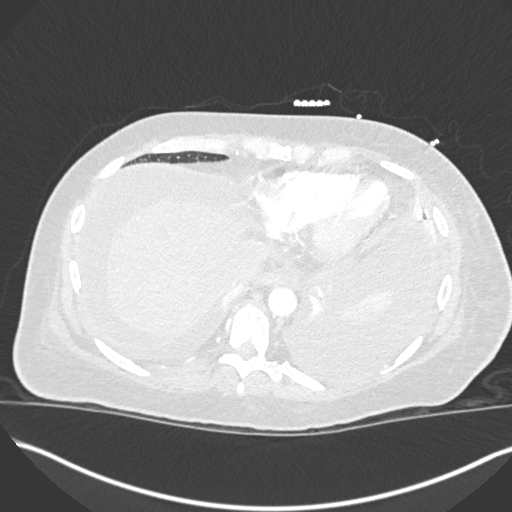
[im 106/270  soft-tissue]
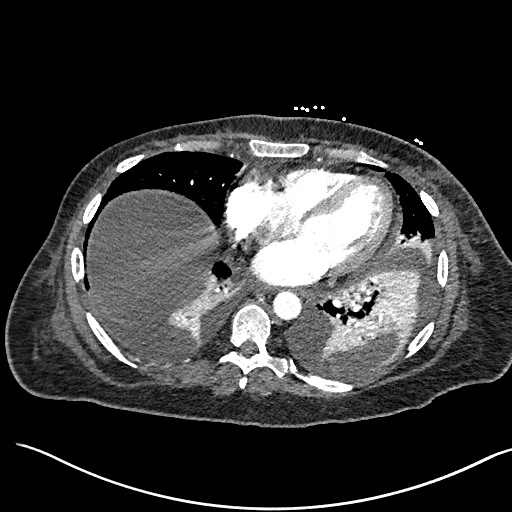
[im 117/270  lung]
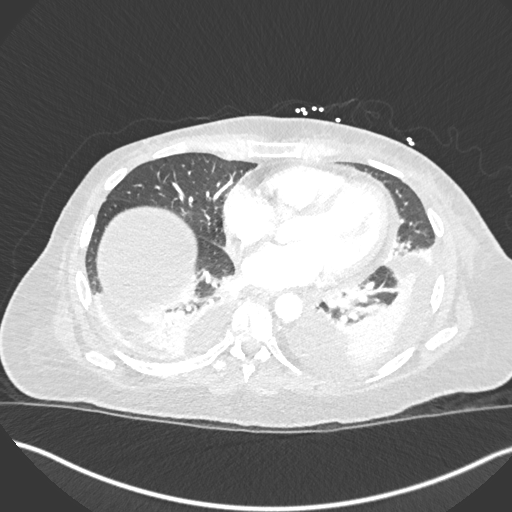
[im 141/270  soft-tissue]
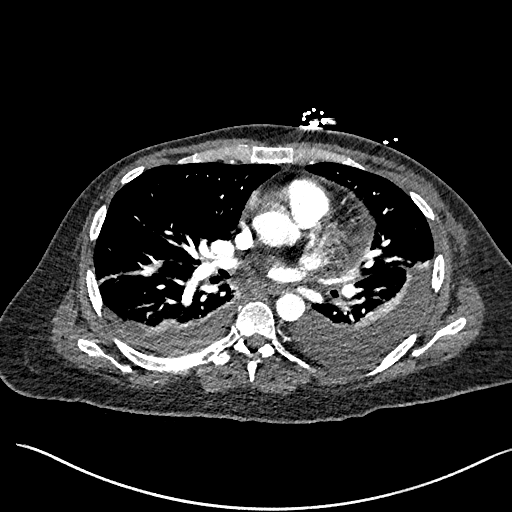
[im 153/270  lung]
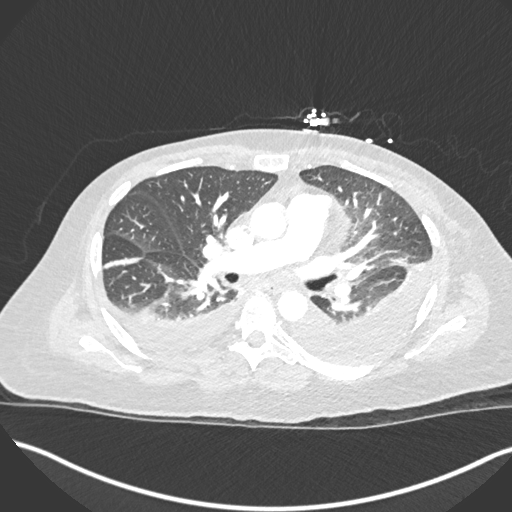
[im 164/270  soft-tissue]
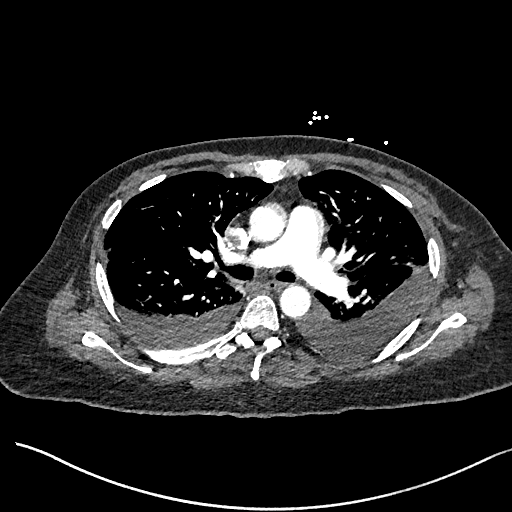
[im 188/270  lung]
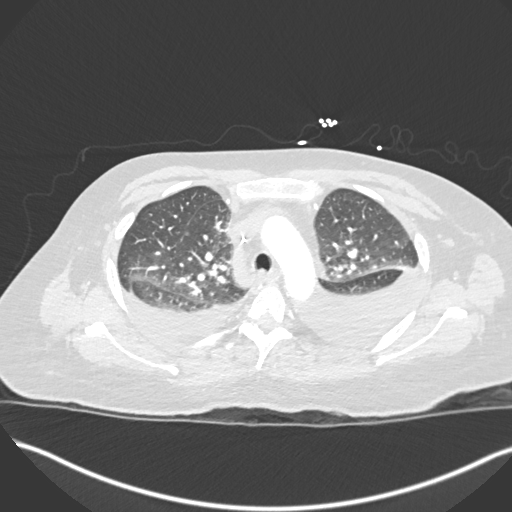
[im 199/270  soft-tissue]
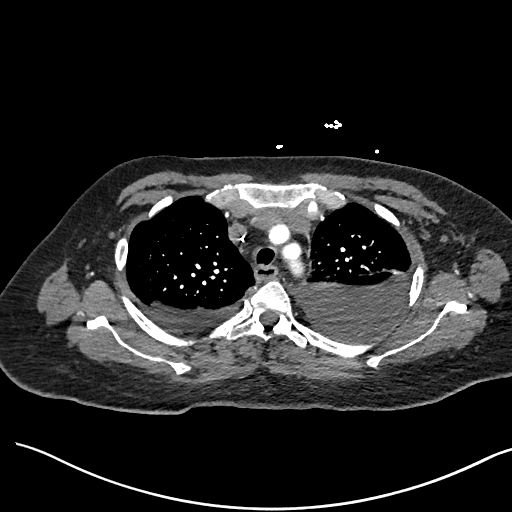
[im 223/270  lung]
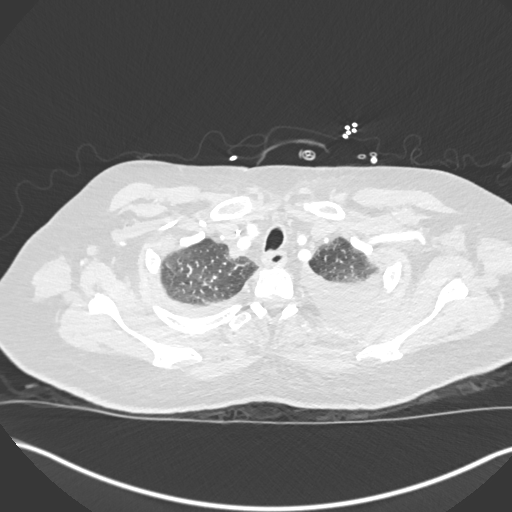
[im 234/270  soft-tissue]
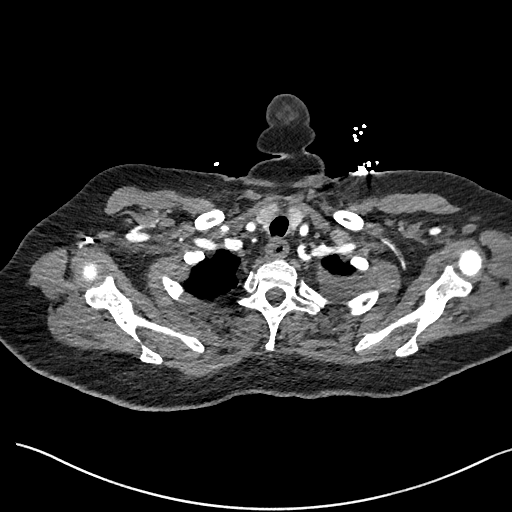
[im 258/270  lung]
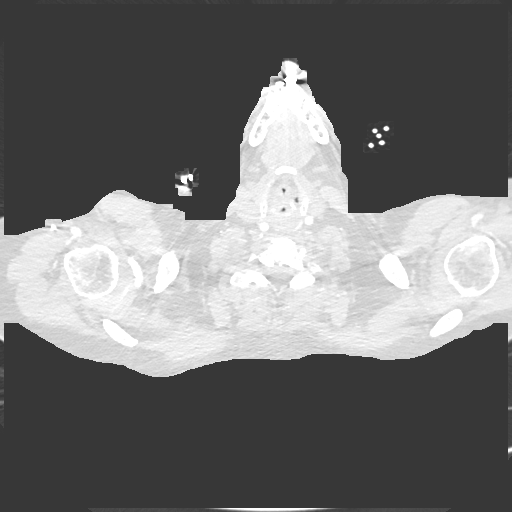

[Series 7: coronal mpr · coronal · 0.57mm/px · 3 of 151 slices shown]
[im 38/151  soft-tissue]
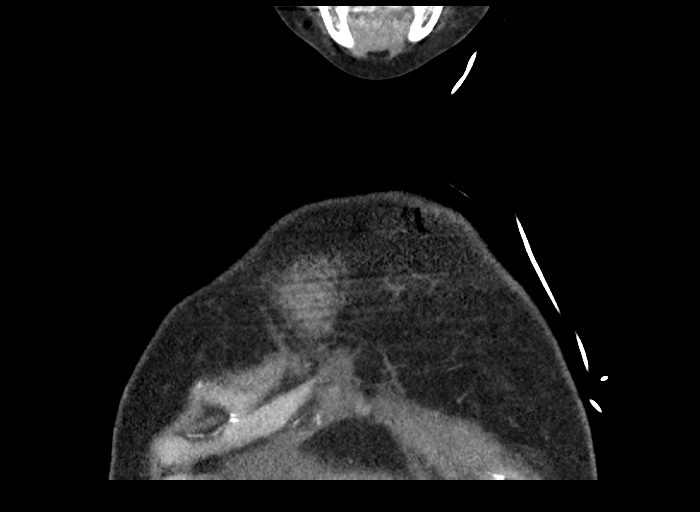
[im 76/151  soft-tissue]
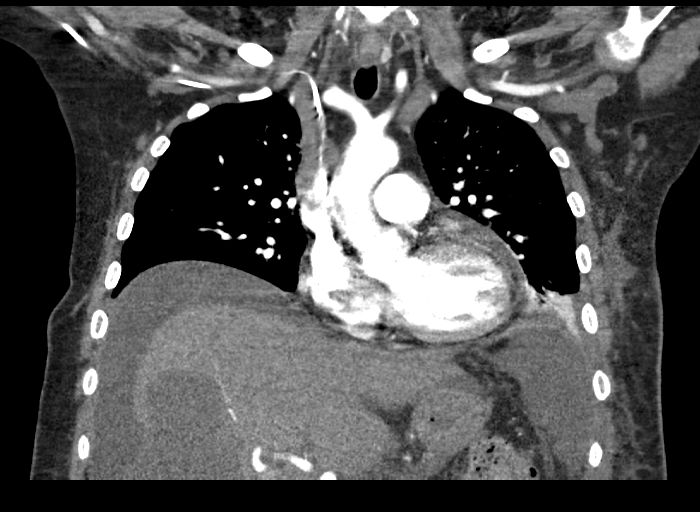
[im 113/151  soft-tissue]
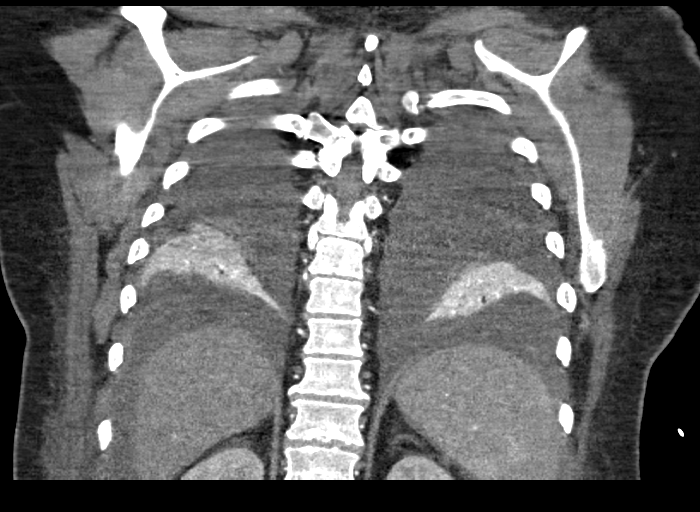

[18 of 46 positions shown; findings below may reference images not displayed]

FINDINGS: Cardiovascular: Pulmonary arterial opacification is excellent. There
are no pulmonary emboli. Heart size is normal. Small amount of
pericardial fluid. No aortic pathology is seen.

Mediastinum/Nodes: No pathologic hilar or mediastinal lymph nodes.

Lungs/Pleura: Moderate effusion layering dependently on the right.
Moderate to large effusion layering dependently on the left.
Dependent pulmonary atelectasis in association with those effusions.
Aerated lung does not show any evidence of metastatic disease.

Upper Abdomen: Development of a large amount of ascites. Advanced
upper abdominal metastatic disease as seen previously.

Musculoskeletal: No fracture or lytic destructive lesion.

Review of the MIP images confirms the above findings.
IMPRESSION: Negative for pulmonary emboli.

Development of bilateral pleural effusions, larger on the left than
the right with dependent pulmonary atelectasis. Development of
ascites, in this patient with known hepatic metastatic disease.

## 2018-12-13 MED ORDER — SODIUM CHLORIDE 0.9 % IV SOLN
2.0000 g | Freq: Three times a day (TID) | INTRAVENOUS | Status: AC
  Administered 2018-12-13 – 2018-12-15 (×6): 2 g via INTRAVENOUS
  Filled 2018-12-13 (×7): qty 2

## 2018-12-13 MED ORDER — MAGNESIUM OXIDE 400 (241.3 MG) MG PO TABS
400.0000 mg | ORAL_TABLET | Freq: Every day | ORAL | Status: DC
  Administered 2018-12-13 – 2018-12-16 (×4): 400 mg via ORAL
  Filled 2018-12-13 (×6): qty 1

## 2018-12-13 MED ORDER — LIDOCAINE VISCOUS HCL 2 % MT SOLN
15.0000 mL | Freq: Four times a day (QID) | OROMUCOSAL | Status: DC | PRN
  Administered 2018-12-13 – 2018-12-14 (×5): 15 mL via OROMUCOSAL
  Filled 2018-12-13 (×5): qty 15

## 2018-12-13 MED ORDER — ESCITALOPRAM OXALATE 10 MG PO TABS
20.0000 mg | ORAL_TABLET | Freq: Every day | ORAL | Status: DC
  Administered 2018-12-13 – 2018-12-16 (×4): 20 mg via ORAL
  Filled 2018-12-13 (×5): qty 2

## 2018-12-13 MED ORDER — IOHEXOL 350 MG/ML SOLN
100.0000 mL | Freq: Once | INTRAVENOUS | Status: AC | PRN
  Administered 2018-12-13: 07:00:00 100 mL via INTRAVENOUS

## 2018-12-13 MED ORDER — LACTATED RINGERS IV SOLN
INTRAVENOUS | Status: DC
  Administered 2018-12-13 – 2018-12-15 (×4): via INTRAVENOUS

## 2018-12-13 MED ORDER — POTASSIUM CHLORIDE IN NACL 20-0.9 MEQ/L-% IV SOLN: INTRAVENOUS | Status: DC

## 2018-12-13 MED ORDER — ONDANSETRON HCL 4 MG/2ML IJ SOLN: 4.0000 mg | Freq: Four times a day (QID) | INTRAMUSCULAR | Status: DC | PRN

## 2018-12-13 MED ORDER — SODIUM CHLORIDE 0.9 % IV SOLN
2.0000 g | Freq: Two times a day (BID) | INTRAVENOUS | Status: DC
  Administered 2018-12-13: 2 g via INTRAVENOUS

## 2018-12-13 MED ORDER — NYSTATIN 100000 UNIT/ML MT SUSP
500000.0000 [IU] | Freq: Four times a day (QID) | OROMUCOSAL | Status: DC
  Administered 2018-12-13 – 2018-12-16 (×13): 500000 [IU] via ORAL
  Filled 2018-12-13 (×13): qty 5

## 2018-12-13 MED ORDER — PROCHLORPERAZINE MALEATE 5 MG PO TABS: 10.0000 mg | ORAL_TABLET | Freq: Four times a day (QID) | ORAL | Status: DC | PRN

## 2018-12-13 MED ORDER — ZOLPIDEM TARTRATE 5 MG PO TABS: 5.0000 mg | ORAL_TABLET | Freq: Every evening | ORAL | Status: DC | PRN

## 2018-12-13 MED ORDER — OXYCODONE-ACETAMINOPHEN 5-325 MG PO TABS
1.0000 | ORAL_TABLET | ORAL | Status: DC | PRN
  Administered 2018-12-13 – 2018-12-15 (×6): 1 via ORAL
  Filled 2018-12-13 (×7): qty 1

## 2018-12-13 MED ORDER — PANTOPRAZOLE SODIUM 40 MG PO TBEC
40.0000 mg | DELAYED_RELEASE_TABLET | Freq: Every day | ORAL | Status: DC
  Administered 2018-12-13 – 2018-12-16 (×4): 40 mg via ORAL
  Filled 2018-12-13 (×5): qty 1

## 2018-12-13 MED ORDER — DOCUSATE SODIUM 100 MG PO CAPS: 100.0000 mg | ORAL_CAPSULE | Freq: Two times a day (BID) | ORAL | Status: DC | PRN

## 2018-12-13 MED ORDER — MAGIC MOUTHWASH
5.0000 mL | Freq: Four times a day (QID) | ORAL | Status: DC | PRN
  Administered 2018-12-14: 5 mL via ORAL
  Filled 2018-12-13 (×2): qty 5

## 2018-12-13 MED ORDER — POTASSIUM CHLORIDE CRYS ER 10 MEQ PO TBCR
20.0000 meq | EXTENDED_RELEASE_TABLET | Freq: Every day | ORAL | Status: DC
  Administered 2018-12-13 – 2018-12-15 (×3): 20 meq via ORAL
  Filled 2018-12-13 (×6): qty 2

## 2018-12-13 MED ORDER — MAGIC MOUTHWASH W/LIDOCAINE
5.0000 mL | Freq: Four times a day (QID) | ORAL | Status: DC | PRN
  Filled 2018-12-13: qty 5

## 2018-12-13 MED ORDER — LIDOCAINE VISCOUS HCL 2 % MT SOLN
15.0000 mL | Freq: Once | OROMUCOSAL | Status: AC
  Administered 2018-12-13: 15 mL via OROMUCOSAL
  Filled 2018-12-13: qty 15

## 2018-12-13 NOTE — Progress Notes (Signed)
*  PRELIMINARY RESULTS* Echocardiogram 2D Echocardiogram has been performed.  Leavy Cella 12/13/2018, 12:26 PM

## 2018-12-13 NOTE — Progress Notes (Signed)
PROGRESS NOTE  AHNI BRADWELL FTD:[id number] DOB: 20-Sep-1970 DOA: 12/12/2018 PCP: Doree Albee, MD  Brief History:  48 year old female with a history of metastatic breast cancer to the liver, anxiety/depression, chemotherapy related neuropathy presenting with shortness of breath and nonproductive cough that began on 12/09/2018.  The patient had subjective fevers and chills.  She visited the [facility] on 12/10/2018 for her regularly scheduled chemotherapy.  It was held because of her leukopenia.  The patient was given Neupogen 480 mcg on 12/10/2018.  The patient denied any nausea, vomiting, diarrhea, dysuria, hematuria, neck pain, headache.  She denies any hemoptysis. She denies any recent travels or sick contacts.  Upon presentation, the patient was noted to have a temperature of 103.0 F with soft blood pressures.  Oxygen saturation is 100% on 3 L.  She was started on vancomycin and levofloxacin.  With an elevated d-dimer, CT angiogram of the chest was obtained and was negative for pulmonary embolus, but showed bilateral pleural effusions, left greater than right with bibasilar atelectasis.  Her lactic acid was elevated to 4.3.  Initial WBC was 1.2 with platelets 69,000.  Assessment/Plan: Neutropenic fever -Discontinue levofloxacin -Start cefepime -ANC 176 -Source unclear presently -UA complete -urine culture although it will be low yield as pt has been on abx  Sepsis -Present on admission -Source unclear presently -Presented with leukopenia, elevated lactic acid, and fever of 103.0 F -Continue antibiotics pending culture data -Lactic acid peaked 4.3 -Procalcitonin 1.69  Acute respiratory failure with hypoxia -Secondary to pleural effusions -Stable on 3 L nasal cannula -Plan for thoracocentesis on 12/14/2018 -CTA chest as described above -Personally reviewed chest x-ray--bilateral pleural effusions  Bilateral pleural effusions -Concerned about malignant pleural  effusions -Plan thoracocentesis 12/14/2018 -Echocardiogram -Urine protein creatinine ratio -Check BNP  Metastatic breast cancer -Initially diagnosed January 2017 -s/p bilateral mastectomy -06/26/2018 liver biopsy--ER/PR positive, HER-2/neu negative breast cancer -Patient follows Dr. 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 Laural Golden -monitor LFTs  Depression/Anxiety -continue lexapro  Hypokalemia -replete -check mag  Hyponatremia -due to volume depletion -IVF     Disposition Plan:   Remain in SDU Family Communication: No  Family at bedside  Consultants:  none  Code Status:  FULL   DVT Prophylaxis:  SCDs   Procedures: As Listed in Progress Note Above  Antibiotics: vanc 7/4 levoflox 7/4 Cefepime 7/5>>>   The patient is critically ill with multiple organ systems failure and requires high complexity decision making for assessment and support, frequent evaluation and titration of therapies, application of advanced monitoring technologies and extensive interpretation of multiple databases.  Critical care time - 45 mins.     Subjective: Patient is feeling weak in general.  She has dyspnea on exertion but no shortness of breath at rest.  She denies any nausea, vomiting, diarrhea, abdominal pain, dysuria, hematuria.  She denies any headache or neck pain.  There is no coughing or hemoptysis.  Objective: Vitals:   12/13/18 0230 12/13/18 0336 12/13/18 0527 12/13/18 0755  BP: 98/70     Pulse: 97   (!) 127  Resp: 18   20  Temp:  98.4 F (36.9 C) 98.4 F (36.9 C) 99.1 F (37.3 C)  TempSrc:  Oral Oral Oral  SpO2: 96%   99%  Weight:  87.6 kg    Height:  _0  (1.651 m)  Intake/Output Summary (Last 24 hours) at 12/13/2018 0930 Last data filed at 12/13/2018 0400 Gross per 24 hour  Intake 1300.15 ml  Output --  Net 1300.15 ml   Weight change:   Exam:   General:  Pt is alert, follows commands appropriately, not in acute distress  HEENT: No icterus, No thrush, No neck mass, [place]/AT  Cardiovascular: RRR, S1/S2, no rubs, no gallops  Respiratory: CTA bilaterally, no wheezing, no crackles, no rhonchi  Abdomen: Soft/+BS, non tender, non distended, no guarding  Extremities: No edema, No lymphangitis, No petechiae, No rashes, no synovitis   Data Reviewed: I have personally reviewed following labs and imaging studies Basic Metabolic Panel: Recent Labs  Lab 12/10/18 0759 12/12/18 1551  NA 138 133*  K 3.6 3.4*  CL 103 98  CO2 26 21*  GLUCOSE 104* 99  BUN 9 11  CREATININE 0.54 0.93  CALCIUM 8.6* 8.3*   Liver Function Tests: Recent Labs  Lab 12/10/18 0759 12/12/18 1551  AST 39 40  ALT 21 22  ALKPHOS 125 135*  BILITOT 1.1 2.4*  PROT 5.3* 5.7*  ALBUMIN 2.4* 2.4*   No results for input(s): LIPASE, AMYLASE in the last 168 hours. No results for input(s): AMMONIA in the last 168 hours. Coagulation Profile: Recent Labs  Lab 12/12/18 1551  INR 1.7*   CBC: Recent Labs  Lab 12/10/18 0759 12/12/18 1551 12/13/18 0616  WBC 0.7* 1.2* 1.6*  NEUTROABS 0.1* 0.0* 0.1*  HGB 8.5* 9.7* 7.9*  HCT 26.7* 29.8* 24.4*  MCV 86.7 85.1 84.4  PLT 73* 69* 67*   Cardiac Enzymes: No results for input(s): CKTOTAL, CKMB, CKMBINDEX, TROPONINI in the last 168 hours. BNP: Invalid input(s): POCBNP CBG: No results for input(s): GLUCAP in the last 168 hours. HbA1C: No results for input(s): HGBA1C in the last 72 hours. Urine analysis:    Component Value Date/Time   COLORURINE AMBER (A) 09/14/2018 1351   APPEARANCEUR CLOUDY (A) 09/14/2018 1351   LABSPEC 1.025 09/14/2018 1351   PHURINE 5.0 09/14/2018 1351   GLUCOSEU NEGATIVE 09/14/2018 1351   HGBUR NEGATIVE 09/14/2018 1351   BILIRUBINUR MODERATE (A) 09/14/2018 1351   KETONESUR 5 (A) 09/14/2018 1351   PROTEINUR 100 (A) 09/14/2018 1351   NITRITE NEGATIVE 09/14/2018 1351    LEUKOCYTESUR NEGATIVE 09/14/2018 1351   Sepsis Labs: _0 (procalcitonin:4,lacticidven:4) ) Recent Results (from the past 240 hour(s))  Culture, blood (Routine x 2)     Status: None (Preliminary result)   Collection Time: 12/12/18  3:50 PM   Specimen: Site Not Specified; Blood  Result Value Ref Range Status   Specimen Description   Final    SITE NOT SPECIFIED BOTTLES DRAWN AEROBIC AND ANAEROBIC   Special Requests Blood Culture adequate volume  Final   Culture   Final    NO GROWTH < 24 HOURS Performed at [facility], [address], [place], [place] [address]    Report Status PENDING  Incomplete  SARS Coronavirus 2 (CEPHEID- Performed in [place] hospital lab), Hosp Order     Status: None   Collection Time: 12/12/18  6:00 PM   Specimen: Nasopharyngeal Swab  Result Value Ref Range Status   SARS Coronavirus 2 NEGATIVE NEGATIVE Final    Comment: (NOTE) If result is NEGATIVE SARS-CoV-2 target nucleic acids are NOT DETECTED. The SARS-CoV-2 RNA is generally detectable in upper and lower  respiratory specimens during the acute phase of infection. The lowest  concentration of SARS-CoV-2 viral copies this assay can detect is 250  copies / mL.  A negative result does not preclude SARS-CoV-2 infection  and should not be used as the sole basis for treatment or other  patient management decisions.  A negative result may occur with  improper specimen collection / handling, submission of specimen other  than nasopharyngeal swab, presence of viral mutation(s) within the  areas targeted by this assay, and inadequate number of viral copies  (<250 copies / mL). A negative result must be combined with clinical  observations, patient history, and epidemiological information. If result is POSITIVE SARS-CoV-2 target nucleic acids are DETECTED. The SARS-CoV-2 RNA is generally detectable in upper and lower  respiratory specimens dur ing the acute phase of infection.  Positive  results are  indicative of active infection with SARS-CoV-2.  Clinical  correlation with patient history and other diagnostic information is  necessary to determine patient infection status.  Positive results do  not rule out bacterial infection or co-infection with other viruses. If result is PRESUMPTIVE POSTIVE SARS-CoV-2 nucleic acids MAY BE PRESENT.   A presumptive positive result was obtained on the submitted specimen  and confirmed on repeat testing.  While 2019 novel coronavirus  (SARS-CoV-2) nucleic acids may be present in the submitted sample  additional confirmatory testing may be necessary for epidemiological  and / or clinical management purposes  to differentiate between  SARS-CoV-2 and other Sarbecovirus currently known to infect humans.  If clinically indicated additional testing with an alternate test  methodology [phone]) is advised. The SARS-CoV-2 RNA is generally  detectable in upper and lower respiratory sp ecimens during the acute  phase of infection. The expected result is Negative. Fact Sheet for Patients:  StrictlyIdeas.no Fact Sheet for Healthcare Providers: BankingDealers.co.za This test is not yet approved or cleared by the [place] FDA and has been authorized for detection and/or diagnosis of SARS-CoV-2 by FDA under an Emergency Use Authorization (EUA).  This EUA will remain in effect (meaning this test can be used) for the duration of the COVID-19 declaration under Section 564(b)(1) of the Act, 21 U.S.C. section 360bbb-3(b)(1), unless the authorization is terminated or revoked sooner. Performed at [facility], [address], [place], [place] [address]   MRSA PCR Screening     Status: None   Collection Time: 12/13/18  3:18 AM   Specimen: Nasopharyngeal  Result Value Ref Range Status   MRSA by PCR NEGATIVE NEGATIVE Final    Comment:        The GeneXpert MRSA Assay (FDA approved for NASAL specimens only), is one  component of a comprehensive MRSA colonization surveillance program. It is not intended to diagnose MRSA infection nor to guide or monitor treatment for MRSA infections. Performed at [facility], [address] [place], [place] [address]      Scheduled Meds:  escitalopram  20 mg Oral Daily   feeding supplement (PRO-STAT SUGAR FREE 64)  30 mL Oral BID   magnesium oxide  400 mg Oral Daily   nystatin  500,000 Units Oral QID   pantoprazole  40 mg Oral Daily   potassium chloride  20 mEq Oral Daily   Continuous Infusions:  levofloxacin (LEVAQUIN) IV Stopped (12/12/18 1753)    Procedures/Studies: Ct Angio Chest Pe W Or Wo Contrast  Result Date: 12/13/2018 CLINICAL DATA:  Positive D-dimer. Tachycardia. Metastatic breast cancer. Shortness of breath. EXAM: CT ANGIOGRAPHY CHEST WITH CONTRAST TECHNIQUE: Multidetector CT imaging of the chest was performed using the standard protocol during bolus administration of intravenous contrast. Multiplanar CT image reconstructions and MIPs were obtained to  evaluate the vascular anatomy. CONTRAST:  150m OMNIPAQUE IOHEXOL 350 MG/ML SOLN COMPARISON:  Chest radiography 12/12/2018.  Chest CT 09/07/2018 FINDINGS: Cardiovascular: Pulmonary arterial opacification is excellent. There are no pulmonary emboli. Heart size is normal. Small amount of pericardial fluid. No aortic pathology is seen. Mediastinum/Nodes: No pathologic hilar or mediastinal lymph nodes. Lungs/Pleura: Moderate effusion layering dependently on the right. Moderate to large effusion layering dependently on the left. Dependent pulmonary atelectasis in association with those effusions. Aerated lung does not show any evidence of metastatic disease. Upper Abdomen: Development of a large amount of ascites. Advanced upper abdominal metastatic disease as seen previously. Musculoskeletal: No fracture or lytic destructive lesion. Review of the MIP images confirms the above findings. IMPRESSION: Negative  for pulmonary emboli. Development of bilateral pleural effusions, larger on the left than the right with dependent pulmonary atelectasis. Development of ascites, in this patient with known hepatic metastatic disease. Electronically Signed   By: MNelson ChimesM.D.   On: 12/13/2018 07:04   Dg Chest Portable 1 View  Result Date: 12/12/2018 CLINICAL DATA:  Shortness of breath EXAM: PORTABLE CHEST 1 VIEW COMPARISON:  September 18, 2018 FINDINGS: The heart size and mediastinal contours are stable. Right central venous line is unchanged. There is patchy opacity of left lung base with left pleural effusion. The right lung is clear. The visualized skeletal structures are unremarkable. IMPRESSION: Patchy consolidation of left lung base pneumonia is not excluded. Left pleural effusion is noted. Electronically Signed   By: WAbelardo DieselM.D.   On: 12/12/2018 16:54    DOrson Eva DO  [facility] Pager [phone] If 7PM-7AM, please contact night-coverage [email or web address] Password [email or web address] 12/13/2018, 9:30 AM   LOS: 1 day

## 2018-12-14 ENCOUNTER — Encounter (HOSPITAL_COMMUNITY): Payer: Self-pay

## 2018-12-14 ENCOUNTER — Inpatient Hospital Stay (HOSPITAL_COMMUNITY): Payer: 59

## 2018-12-14 DIAGNOSIS — Z515 Encounter for palliative care: Secondary | ICD-10-CM

## 2018-12-14 DIAGNOSIS — J189 Pneumonia, unspecified organism: Secondary | ICD-10-CM

## 2018-12-14 LAB — BODY FLUID CELL COUNT WITH DIFFERENTIAL
Eos, Fluid: 0 %
Lymphs, Fluid: 85 %
Monocyte-Macrophage-Serous Fluid: 7 % — ABNORMAL LOW (ref 50–90)
Neutrophil Count, Fluid: 8 % (ref 0–25)
Other Cells, Fluid: REACTIVE %
Total Nucleated Cell Count, Fluid: 281 cu mm (ref 0–1000)

## 2018-12-14 LAB — CBC WITH DIFFERENTIAL/PLATELET
Abs Immature Granulocytes: 0.3 10*3/uL — ABNORMAL HIGH (ref 0.00–0.07)
Basophils Absolute: 0.1 10*3/uL (ref 0.0–0.1)
Basophils Relative: 2 %
Eosinophils Absolute: 0 10*3/uL (ref 0.0–0.5)
Eosinophils Relative: 1 %
HCT: 25.1 % — ABNORMAL LOW (ref 36.0–46.0)
Hemoglobin: 8.1 g/dL — ABNORMAL LOW (ref 12.0–15.0)
Immature Granulocytes: 9 %
Lymphocytes Relative: 26 %
Lymphs Abs: 0.9 10*3/uL (ref 0.7–4.0)
MCH: 27.2 pg (ref 26.0–34.0)
MCHC: 32.3 g/dL (ref 30.0–36.0)
MCV: 84.2 fL (ref 80.0–100.0)
Monocytes Absolute: 1.3 10*3/uL — ABNORMAL HIGH (ref 0.1–1.0)
Monocytes Relative: 38 %
Neutro Abs: 0.8 10*3/uL — ABNORMAL LOW (ref 1.7–7.7)
Neutrophils Relative %: 24 %
Platelets: 89 10*3/uL — ABNORMAL LOW (ref 150–400)
RBC: 2.98 MIL/uL — ABNORMAL LOW (ref 3.87–5.11)
RDW: 16.4 % — ABNORMAL HIGH (ref 11.5–15.5)
WBC: 3.4 10*3/uL — ABNORMAL LOW (ref 4.0–10.5)
nRBC: 1.5 % — ABNORMAL HIGH (ref 0.0–0.2)

## 2018-12-14 LAB — COMPREHENSIVE METABOLIC PANEL
ALT: 19 U/L (ref 0–44)
AST: 31 U/L (ref 15–41)
Albumin: 1.9 g/dL — ABNORMAL LOW (ref 3.5–5.0)
Alkaline Phosphatase: 116 U/L (ref 38–126)
Anion gap: 8 (ref 5–15)
BUN: 15 mg/dL (ref 6–20)
CO2: 24 mmol/L (ref 22–32)
Calcium: 7.8 mg/dL — ABNORMAL LOW (ref 8.9–10.3)
Chloride: 102 mmol/L (ref 98–111)
Creatinine, Ser: 0.57 mg/dL (ref 0.44–1.00)
GFR calc Af Amer: >60 mL/min (ref >60)
GFR calc non Af Amer: >60 mL/min (ref >60)
Glucose, Bld: 99 mg/dL (ref 70–99)
Potassium: 3 mmol/L — ABNORMAL LOW (ref 3.5–5.1)
Sodium: 134 mmol/L — ABNORMAL LOW (ref 135–145)
Total Bilirubin: 1.2 mg/dL (ref 0.3–1.2)
Total Protein: 5 g/dL — ABNORMAL LOW (ref 6.5–8.1)

## 2018-12-14 LAB — LACTATE DEHYDROGENASE, PLEURAL OR PERITONEAL FLUID: LD, Fluid: 77 U/L — ABNORMAL HIGH (ref 3–23)

## 2018-12-14 LAB — GRAM STAIN

## 2018-12-14 LAB — GLUCOSE, PLEURAL OR PERITONEAL FLUID: Glucose, Fluid: 98 mg/dL

## 2018-12-14 LAB — MAGNESIUM: Magnesium: 1.6 mg/dL — ABNORMAL LOW (ref 1.7–2.4)

## 2018-12-14 LAB — PROCALCITONIN: Procalcitonin: 1.99 ng/mL

## 2018-12-14 LAB — PROTEIN, PLEURAL OR PERITONEAL FLUID: Total protein, fluid: <3 g/dL

## 2018-12-14 LAB — LACTATE DEHYDROGENASE: LDH: 190 U/L (ref 98–192)

## 2018-12-14 IMAGING — US VENOUS DOPPLER ULTRASOUND OF  LOWER EXTREMITIES
1 series · 13 of 24 positions shown · non-contrast
Comparison: None.

CLINICAL DATA: 47-year-old female with a history right thigh pain



[Series 1: venous doppler ultrasound of lower extremities · 13 of 71 slices shown]
[im 1/71]
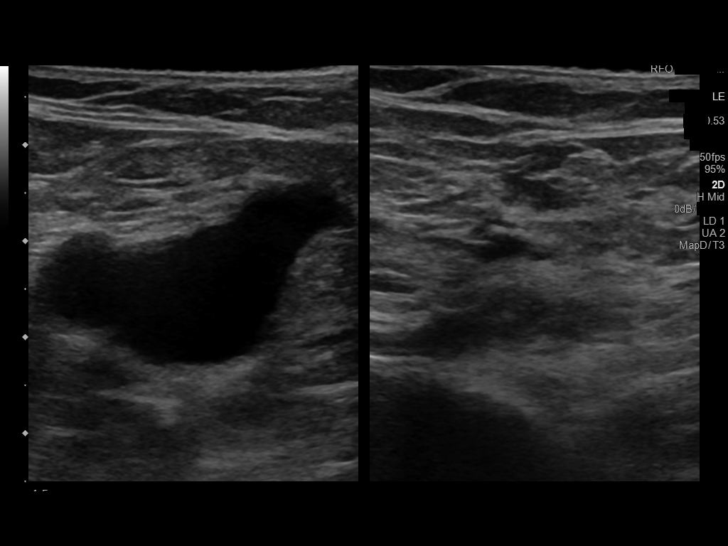
[im 7/71]
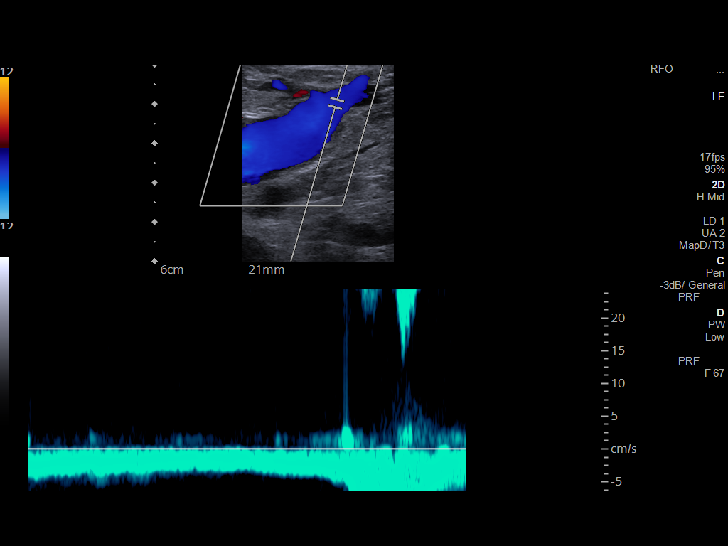
[im 13/71]
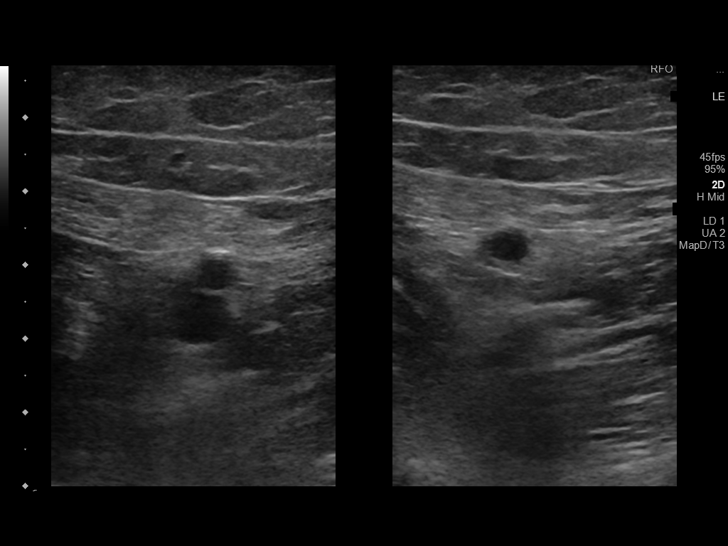
[im 19/71]
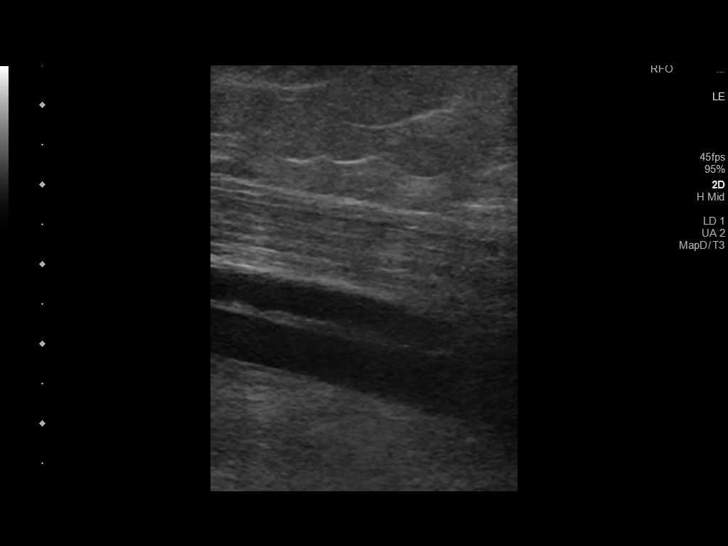
[im 25/71]
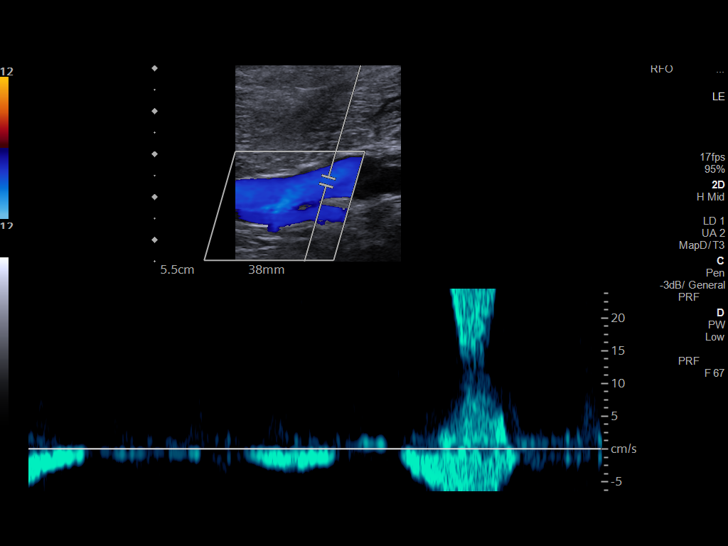
[im 31/71]
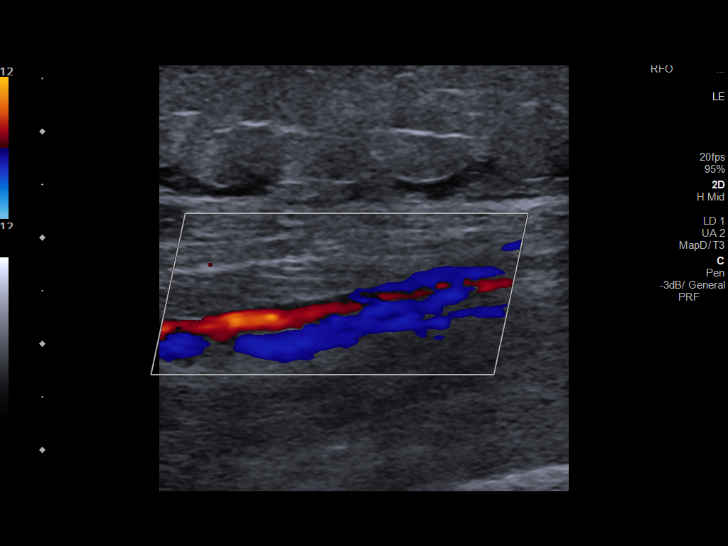
[im 37/71]
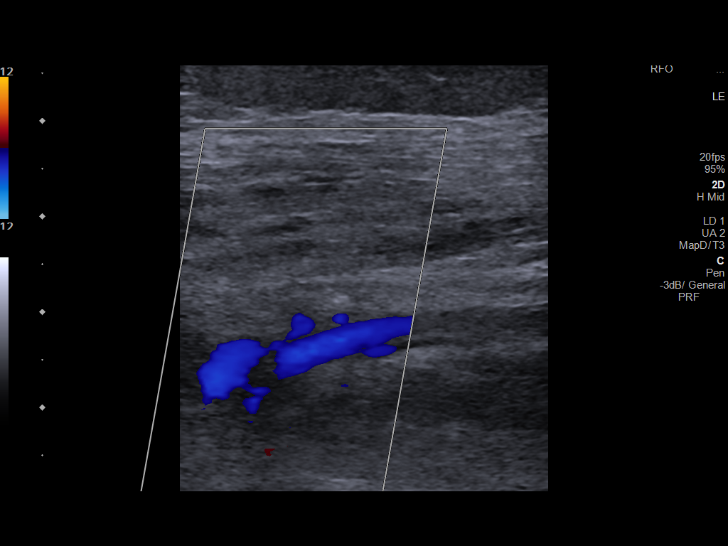
[im 40/71]
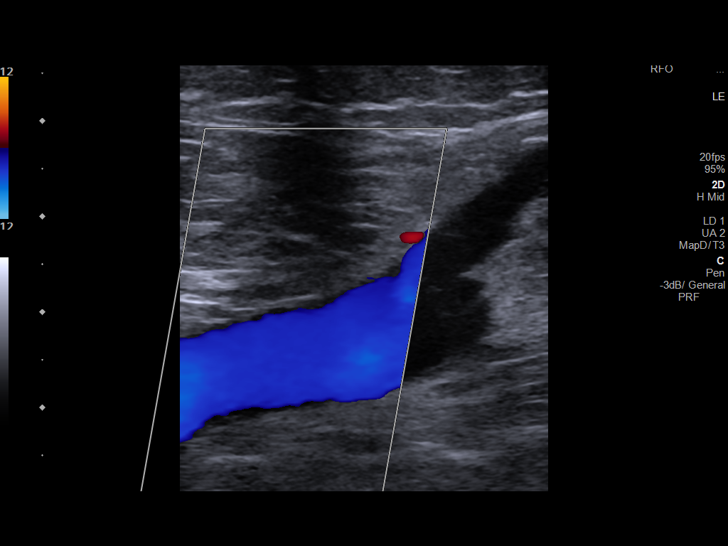
[im 46/71]
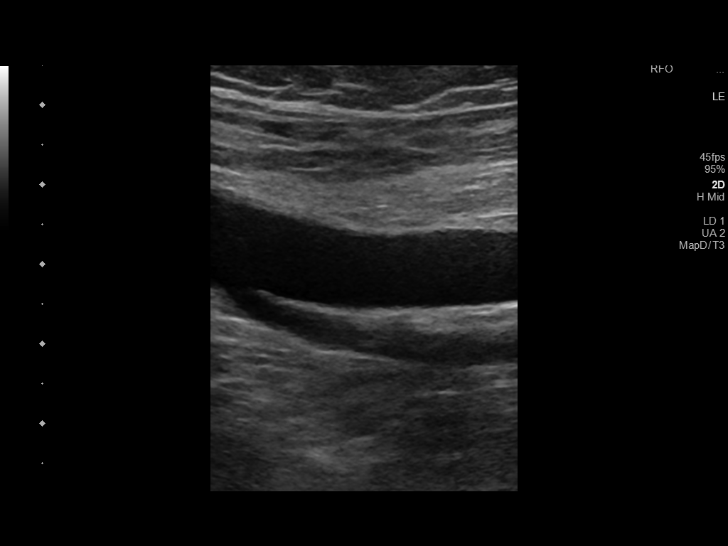
[im 52/71]
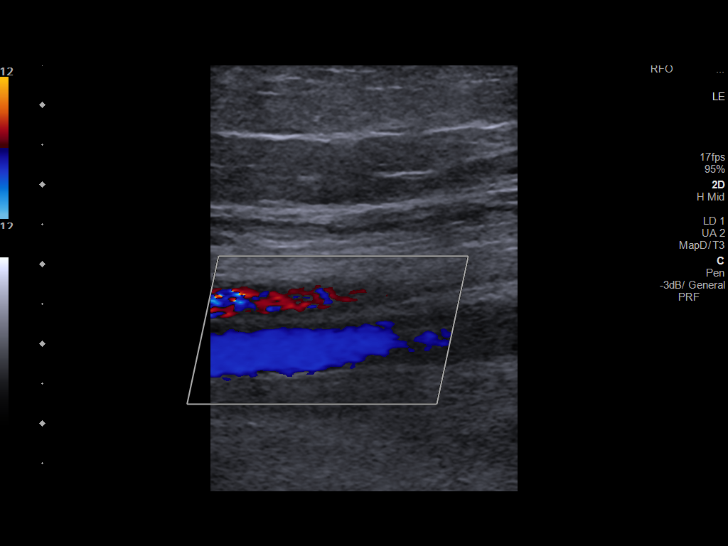
[im 58/71]
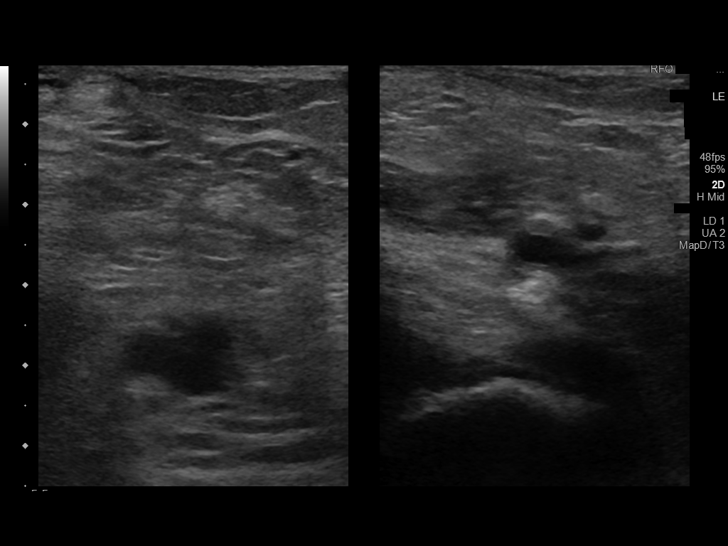
[im 64/71]
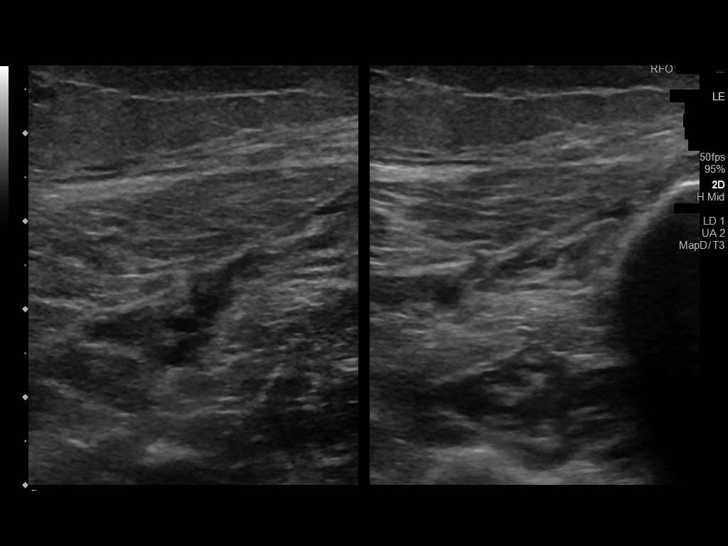
[im 71/71]
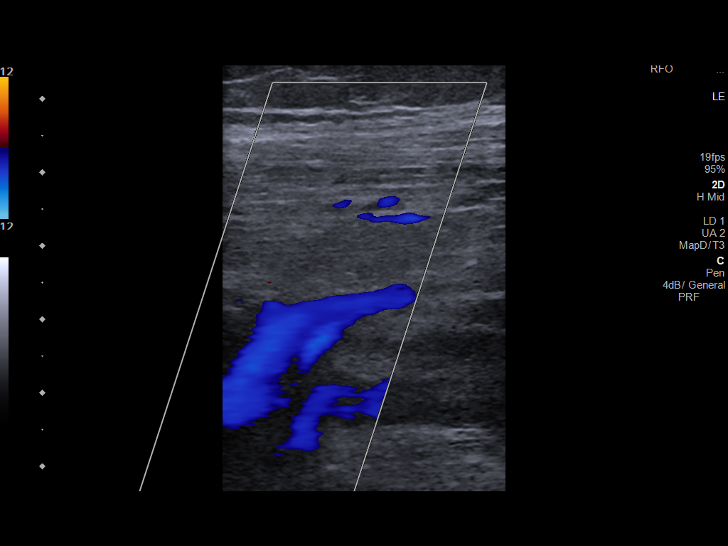

[13 of 24 positions shown; findings below may reference images not displayed]

FINDINGS: RIGHT LOWER EXTREMITY

Common Femoral Vein: No evidence of thrombus. Normal
compressibility, respiratory phasicity and response to augmentation.

Saphenofemoral Junction: No evidence of thrombus. Normal
compressibility and flow on color Doppler imaging.

Profunda Femoral Vein: No evidence of thrombus. Normal
compressibility and flow on color Doppler imaging.

Femoral Vein: No evidence of thrombus. Normal compressibility,
respiratory phasicity and response to augmentation.

Popliteal Vein: No evidence of thrombus. Normal compressibility,
respiratory phasicity and response to augmentation.

Calf Veins: No evidence of thrombus. Normal compressibility and flow
on color Doppler imaging.

Superficial Great Saphenous Vein: No evidence of thrombus. Normal
compressibility and flow on color Doppler imaging.

Other Findings:  Edema

LEFT LOWER EXTREMITY

Common Femoral Vein: No evidence of thrombus. Normal
compressibility, respiratory phasicity and response to augmentation.

Saphenofemoral Junction: No evidence of thrombus. Normal
compressibility and flow on color Doppler imaging.

Profunda Femoral Vein: No evidence of thrombus. Normal
compressibility and flow on color Doppler imaging.

Femoral Vein: No evidence of thrombus. Normal compressibility,
respiratory phasicity and response to augmentation.

Popliteal Vein: No evidence of thrombus. Normal compressibility,
respiratory phasicity and response to augmentation.

Calf Veins: No evidence of thrombus. Normal compressibility and flow
on color Doppler imaging.

Superficial Great Saphenous Vein: No evidence of thrombus. Normal
compressibility and flow on color Doppler imaging.

Other Findings:  Edema
IMPRESSION: Sonographic survey of the bilateral lower extremities negative for
DVT.

Edema

## 2018-12-14 IMAGING — CR PORTABLE CHEST - 1 VIEW
1 series · 2 of 2 positions shown · non-contrast
Comparison: Expiratory portable chest radiograph [XN] hours
compared to [DATE]

CLINICAL DATA: LEFT pleural effusion post thoracentesis

EXAM:
PORTABLE CHEST 1 VIEW

[Series 1: portable · 0.17mm/px · 2 of 2 slices shown]
[im 1/2]
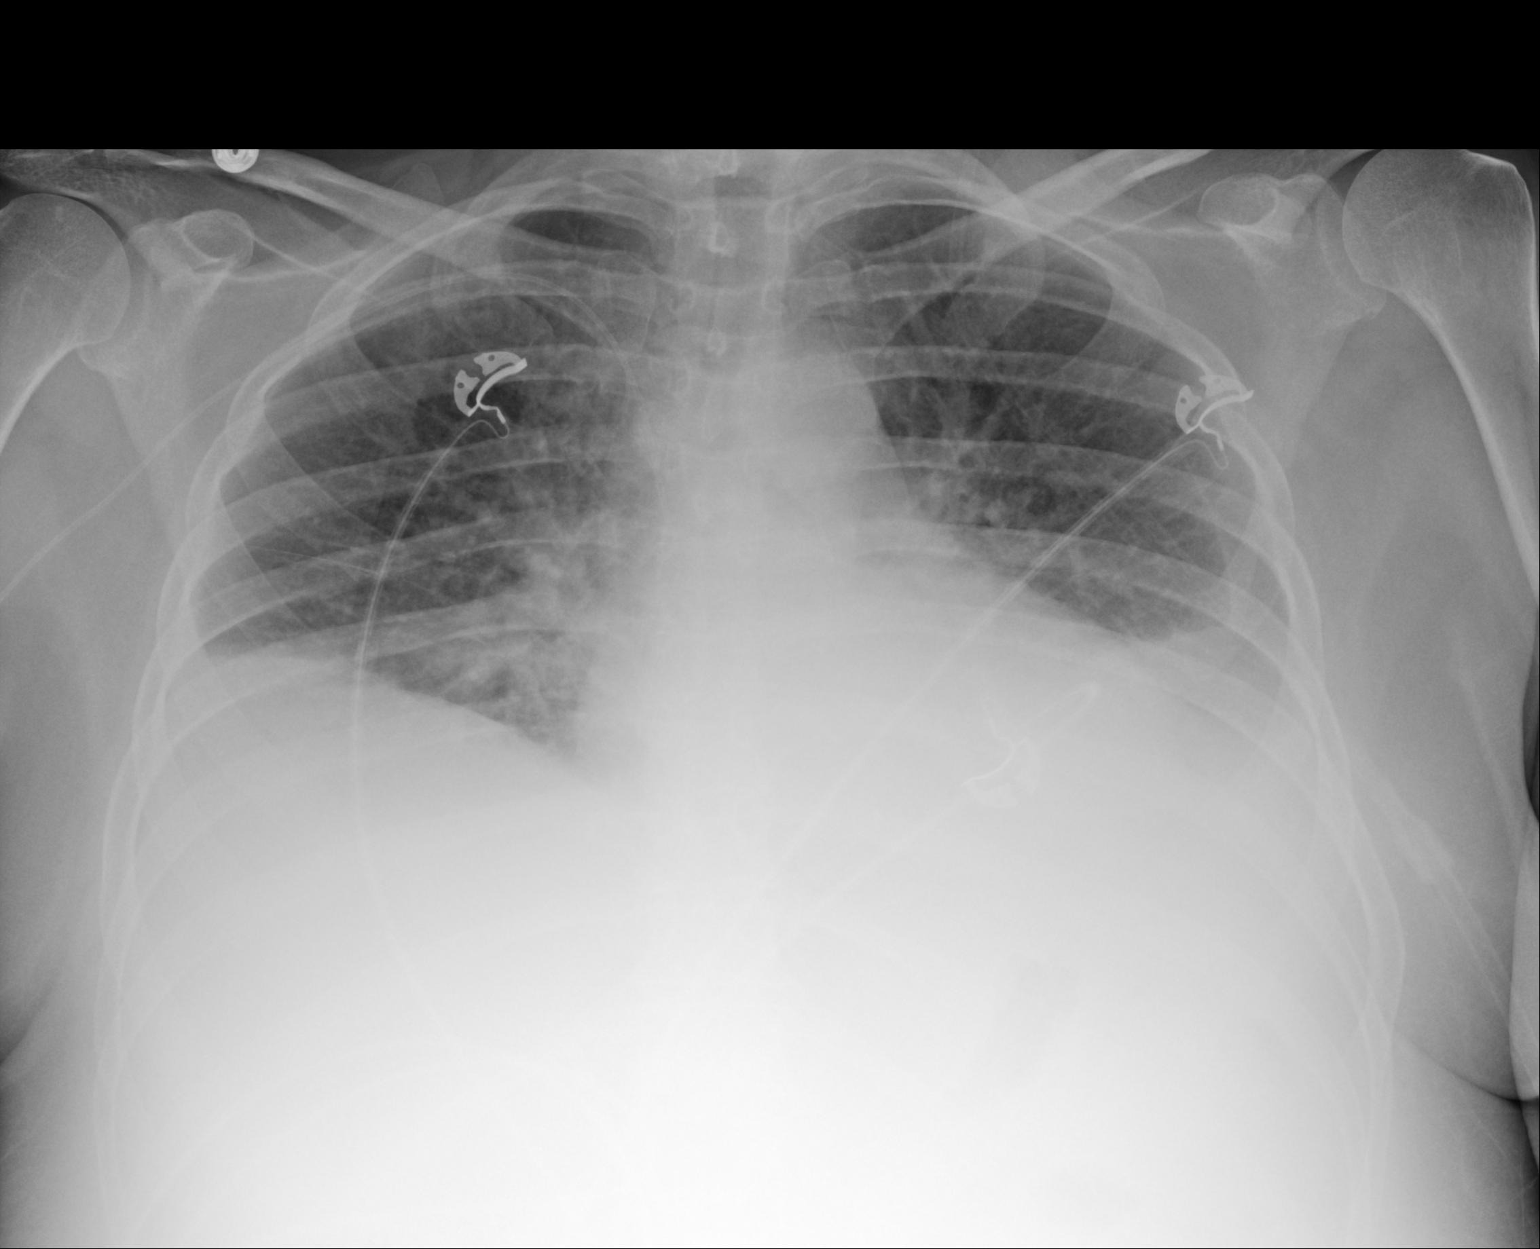
[im 2/2]
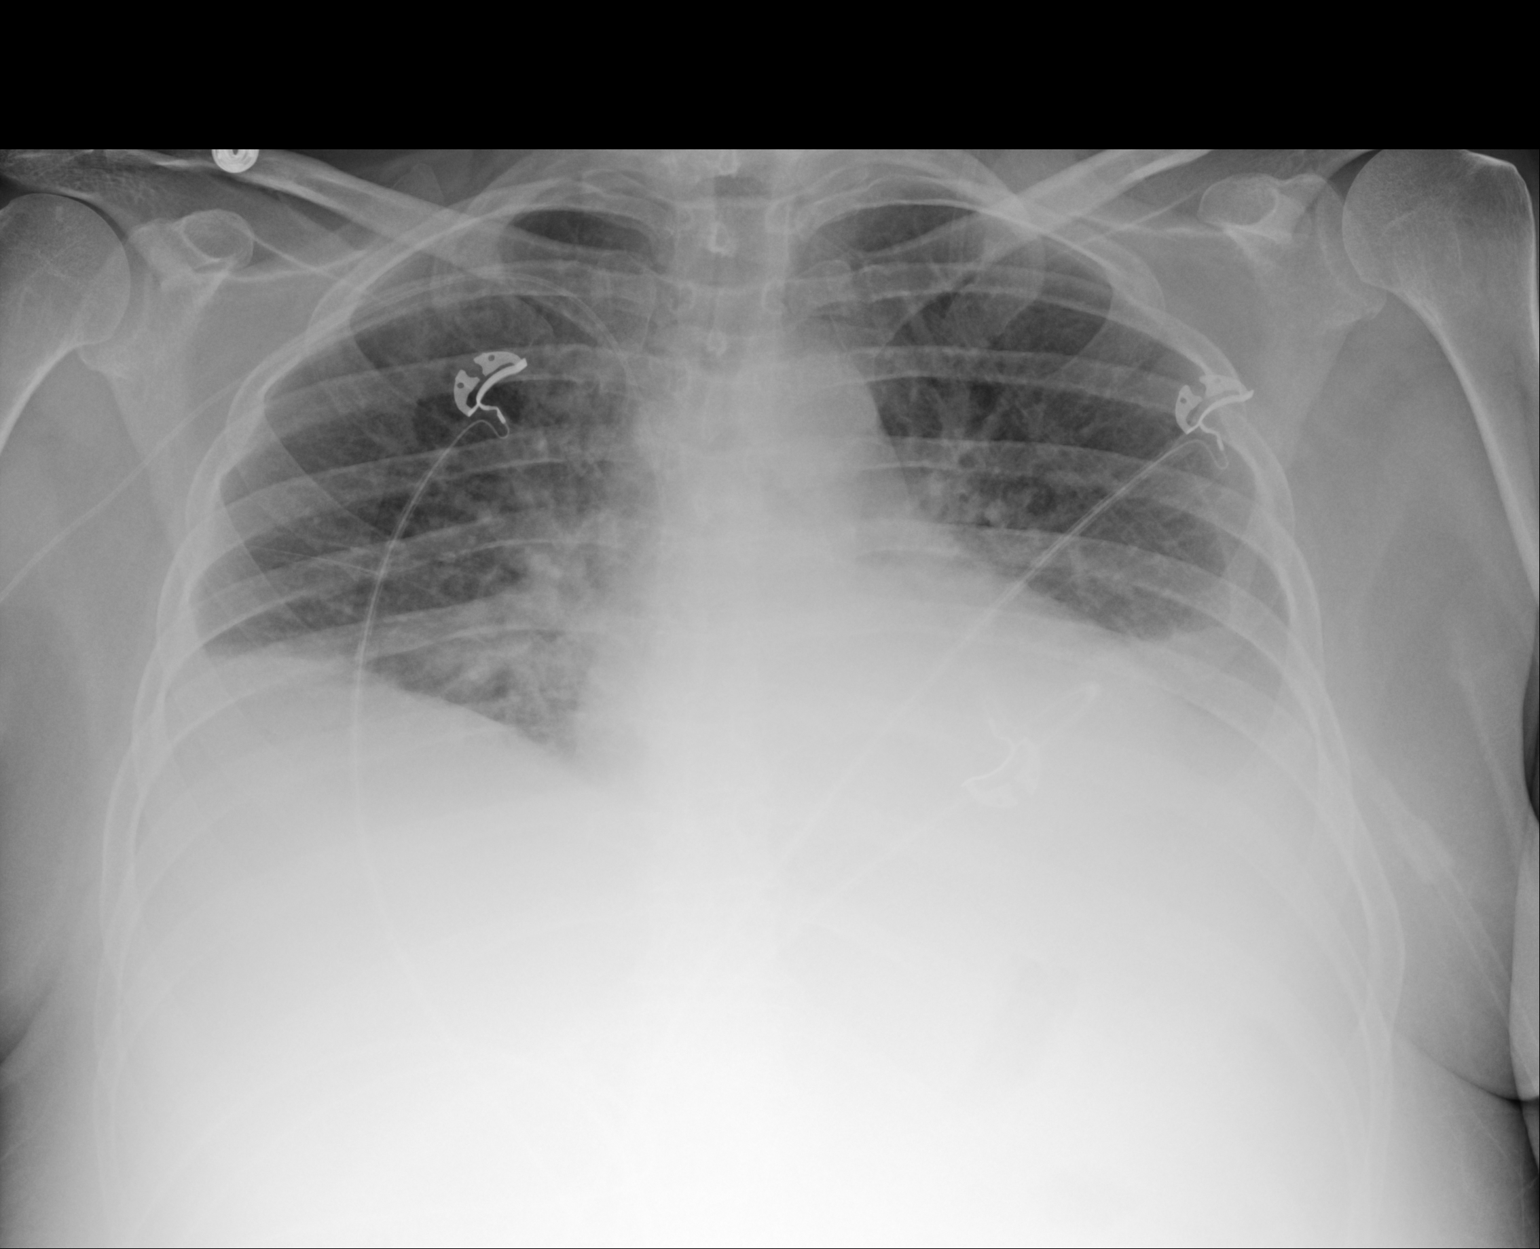

[2 of 2 positions shown; findings below may reference images not displayed]

FINDINGS: RIGHT arm PICC line with tip projecting over SVC.

Enlargement of cardiac silhouette with vascular congestion.

Bibasilar atelectasis and small pleural effusions.

No pneumothorax following thoracentesis.
IMPRESSION: No pneumothorax following thoracentesis.

Persistent bibasilar atelectasis and small pleural effusions.

## 2018-12-14 IMAGING — US US THORACENTESIS ASP PLEURAL SPACE W/IMG GUIDE
1 series · 4 of 4 positions shown · non-contrast
Comparison: none

INDICATION: BILATERAL pleural effusions, metastatic breast cancer

[Series 1: us thoracentesis asp pleural space w/img guide · 4 of 4 slices shown]
[im 1/4]
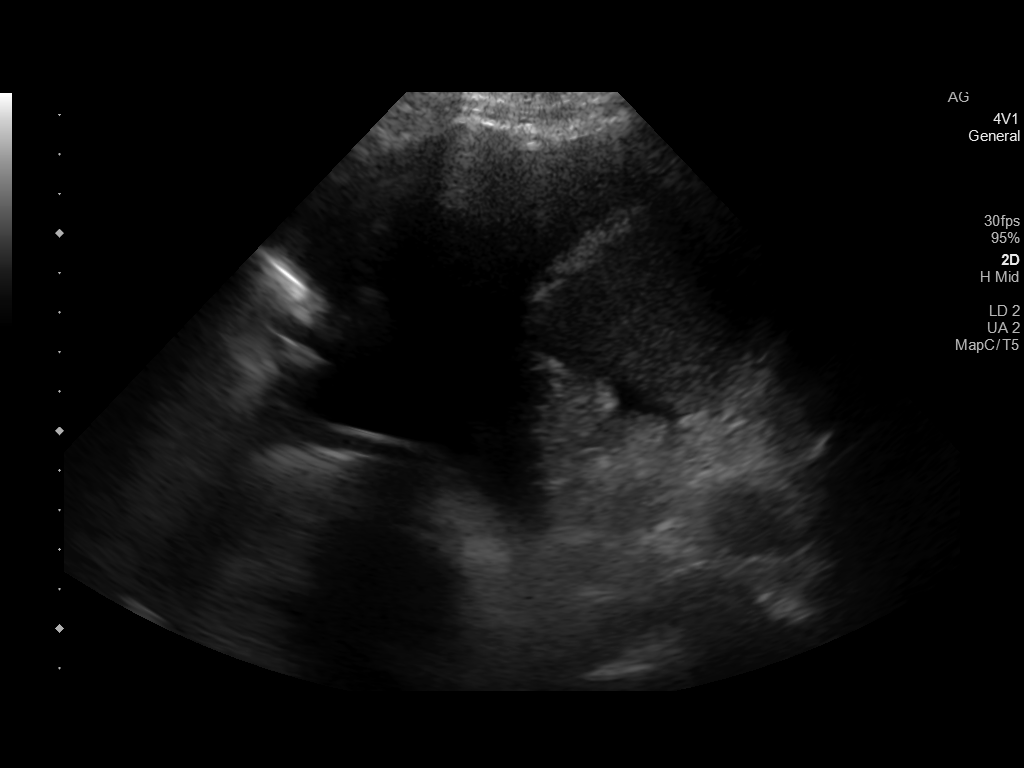
[im 2/4]
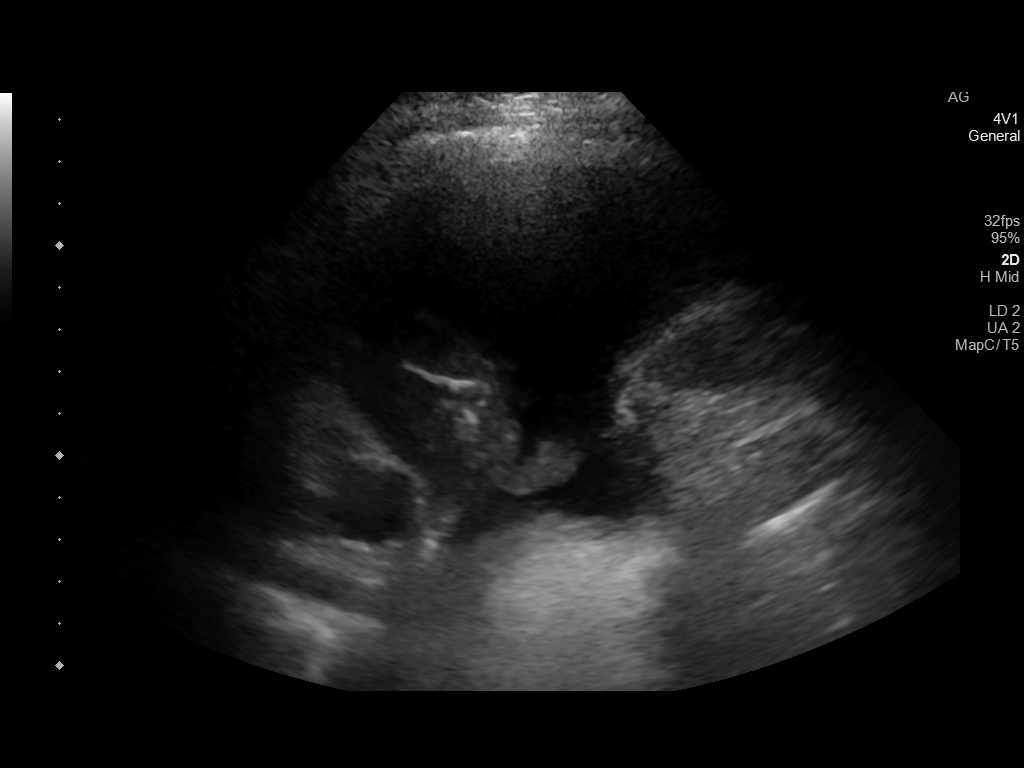
[im 3/4]
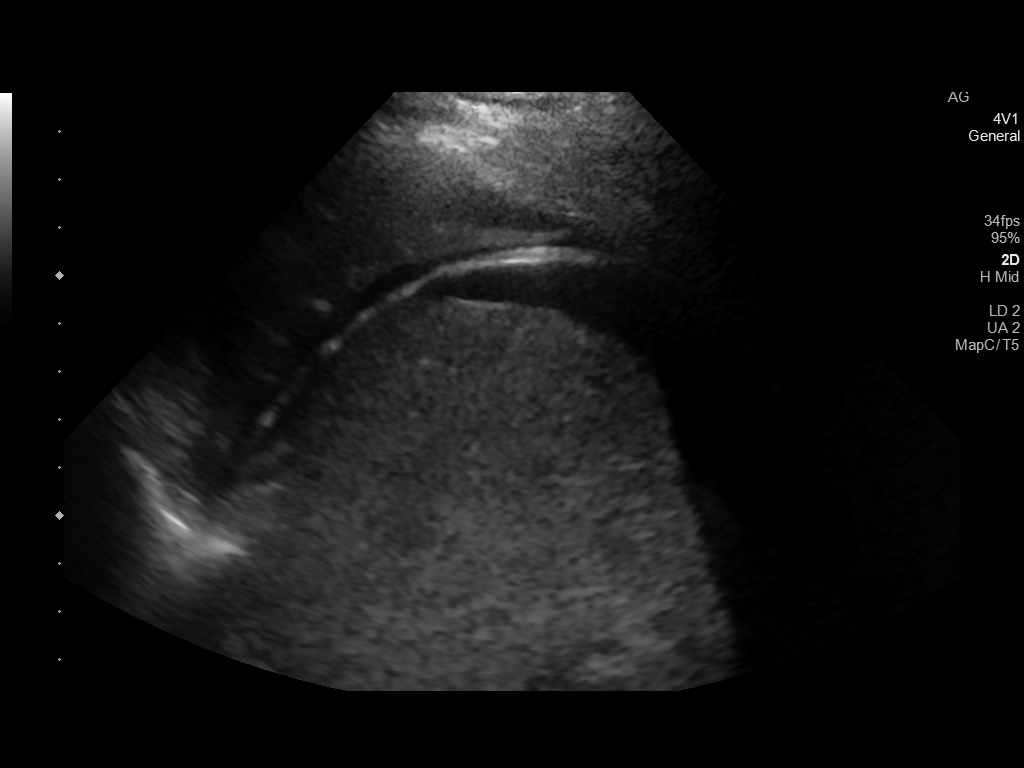
[im 4/4]
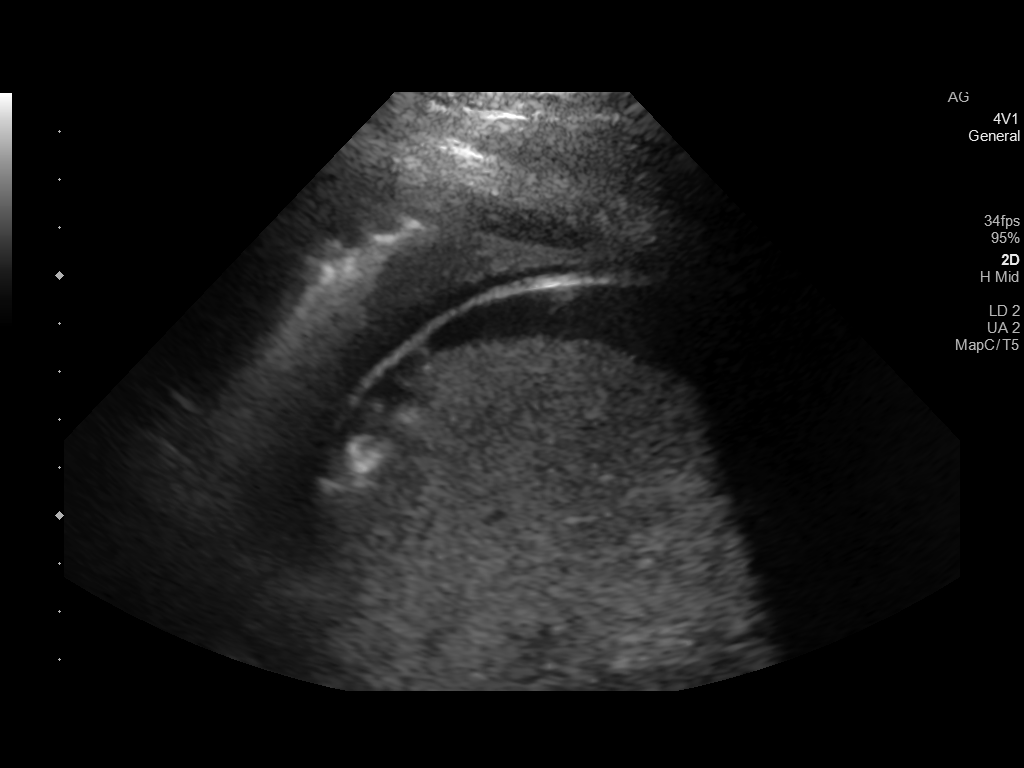

[4 of 4 positions shown; findings below may reference images not displayed]

EXAM:
ULTRASOUND GUIDED DIAGNOSTIC LEFT THORACENTESIS

MEDICATIONS:
None.

COMPLICATIONS:
None immediate.

PROCEDURE:
Procedure, benefits, and risks of procedure were discussed with
patient.

Written informed consent for procedure was obtained.

Time out protocol followed.

Pleural effusion localized by ultrasound at the posterior LEFT
hemithorax.

Skin prepped and draped in usual sterile fashion.

Skin and soft tissues anesthetized with 10 mL of 1% lidocaine.

8 French thoracentesis catheter placed into the LEFT pleural space.

120 mL of yellow LEFT pleural fluid aspirated by syringe pump.

Procedure tolerated well by patient without immediate complication.

Fluid appeared mildly complicated by preprocedural imaging.
FINDINGS: A total of approximately 120 mL of LEFT pleural fluid was removed.

Samples were sent to the laboratory as requested by the clinical
team.
IMPRESSION: Successful ultrasound guided LEFT thoracentesis yielding 120 mL of
pleural fluid.

## 2018-12-14 MED ORDER — TEMAZEPAM 7.5 MG PO CAPS: 7.5000 mg | ORAL_CAPSULE | Freq: Every evening | ORAL | Status: DC | PRN

## 2018-12-14 MED ORDER — MAGIC MOUTHWASH
5.0000 mL | Freq: Four times a day (QID) | ORAL | Status: DC
  Administered 2018-12-14 – 2018-12-15 (×6): 5 mL via ORAL
  Filled 2018-12-14 (×7): qty 5

## 2018-12-14 MED ORDER — LIDOCAINE VISCOUS HCL 2 % MT SOLN
15.0000 mL | Freq: Four times a day (QID) | OROMUCOSAL | Status: DC
  Administered 2018-12-14 – 2018-12-15 (×6): 15 mL via OROMUCOSAL
  Filled 2018-12-14 (×7): qty 15

## 2018-12-14 MED ORDER — TEMAZEPAM 15 MG PO CAPS: 15.0000 mg | ORAL_CAPSULE | Freq: Every evening | ORAL | Status: DC | PRN

## 2018-12-14 NOTE — Consult Note (Signed)
Consultation Note Date: 12/14/2018   Patient Name: Pamela Long  DOB: Feb 16, 1971  MRN: [id number]  Age / Sex: 48 y.o., female  PCP: Doree Albee, MD Referring Physician: Orson Eva, MD  Reason for Consultation: Establishing goals of care  HPI/Patient Profile: 48 y.o. 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 Followed by [facility]/Dr. [name], last seen 12/10/18 when chemotherapy was held due to leukopenia. Hospital admission for neutropenic fever, sepsis with lactic acid 4.3 on admit, acute respiratory failure with hypoxia requiring 3L [place], bilateral pleural effusions. Pending thoracentesis and venous duplex, scheduled for 7/6 afternoon. Palliative medicine consultation for goals of care.   Clinical Assessment and Goals of Care:  I have reviewed medical records in detail including oncology notes, discussed with RN, and met with patient at bedside.  I introduced Palliative Medicine as specialized medical care for people living with serious illness. It focuses on providing relief from the symptoms and stress of a serious illness. The goal is to improve quality of life for both the patient and the family.  Selena Batten is exhausted this afternoon. She is waiting for thoracentesis and lower extremity ultrasounds to be completed. She requests we speak about goals of care tomorrow, after these procedures are completed. PMT provider reassured her of ongoing palliative support during hospitalization.   Updated her on plan of care and plan for today.   We did discuss her symptom management and prn medications. She has ongoing oral discomfort due to mouth sores secondary to chemotherapy. She is taking nystatin and has been asking for magic  mouthwash/lidocaine as needed. Appetite is poor because of the sores. States relief from prn pain medication. She takes Restoril prn at home for sleep but has been able to rest well since hospitalized. Denies nausea.   Becca appreciative of visit but again prefers to speak more with me tomorrow after procedures are completed. Support provided. Answered questions and concerns.    SUMMARY OF RECOMMENDATIONS    Continue FULL code and current plan of care.   Introduced palliative medicine and developed rapport with patient. Patient has a busy afternoon and requests PMT provider f/u tomorrow to discuss [place]. PMT will see again 7/7.  Code Status/Advance Care Planning:  Full code  Symptom Management:   Continue scheduled Nystatin QID  Scheduled magic mouthwash/lidocaine QID  Continue Percocet 5-325 1tab PO q4h prn pain  Restoril 7.60m PO HS prn sleep  Zofran and compazine prn nausea/vomiting  Palliative Prophylaxis:   Aspiration, Delirium Protocol, Frequent Pain Assessment, Oral Care and Turn Reposition  Additional Recommendations (Limitations, Scope, Preferences):  Full Scope Treatment  Psycho-social/Spiritual:   Desire for further Chaplaincy support:yes  Additional Recommendations: Caregiving  Support/Resources  Prognosis:   Unable to determine  Discharge Planning: To Be Determined      Primary Diagnoses: Present on Admission: . HCAP (healthcare-associated pneumonia) . [place]rop[place] . [place]astatic [place] .  Hypokalemia . Hyponatremia . Severe protein-calorie malnutrition ([place]) . Asthma . Depression . Sepsis ([place])   I have reviewed the medical record, interviewed the patient and family, and examined the patient. The following aspects are pertinent.  Past Medical History:  Diagnosis Date  . Anemia   . Anxiety   . Asthma   . Breast cancer ([place])    Left, 2017  . Depression   . GERD (gastroesophageal reflux disease)   . Migraine   .  OAB (overactive bladder)   . Seasonal allergies    Social History   Socioeconomic History  . Marital status: Single    Spouse name: Not on file  . Number of children: 1  . Years of education: 28  . Highest education level: Not on file  Occupational History  . Occupation: disabled  Social Needs  . Financial resource strain: Somewhat hard  . Food insecurity    Worry: Sometimes true    Inability: Sometimes true  . Transportation needs    Medical: No    Non-medical: No  Tobacco Use  . Smoking status: Never Smoker  . Smokeless tobacco: Never Used  Substance and Sexual Activity  . Alcohol use: No  . Drug use: No  . Sexual activity: Not Currently  Lifestyle  . Physical activity    Days per week: 0 days    Minutes per session: 0 min  . Stress: Rather much  Relationships  . Social Herbalist on phone: Once a week    Gets together: Once a week    Attends religious service: 1 to 4 times per year    Active member of club or organization: Yes    Attends meetings of clubs or organizations: Never    Relationship status: Never married  Other Topics Concern  . Not on file  Social History Narrative   Bachelors degree   Accounting   Lives with daughter [name] who has autism and diabetes   Likes to sew, quilt, crafts, crochet   Family History  Problem Relation Age of Onset  . Arthritis Mother   . COPD Mother   . Depression Mother   . Diabetes Mother   . Kidney disease Father   . Heart disease Father 31  . Drug abuse Father   . Hypertension Father   . Diabetes Daughter   . Hashimoto's thyroiditis Daughter   . Irritable bowel syndrome Daughter   . Autism spectrum disorder Daughter   . Early death Maternal Grandmother        drowned  . Early death Maternal Grandfather   . Heart disease Maternal Grandfather   . Heart disease Paternal Grandfather   . Breast cancer Maternal Aunt   . Breast cancer Cousin   . Lung cancer Maternal Aunt   . Lung cancer Maternal  Uncle   . Colon cancer Neg Hx    Scheduled Meds: . escitalopram  20 mg Oral Daily  . feeding supplement (PRO-STAT SUGAR FREE 64)  30 mL Oral BID  . magic mouthwash  5 mL Oral QID   And  . lidocaine  15 mL Mouth/Throat QID  . magnesium oxide  400 mg Oral Daily  . nystatin  500,000 Units Oral QID  . pantoprazole  40 mg Oral Daily  . potassium chloride  20 mEq Oral Daily   Continuous Infusions: . ceFEPime (MAXIPIME) IV 2 g (12/14/18 0932)  . lactated ringers 100 mL/hr at 12/13/18 2029   PRN Meds:.docusate sodium, ondansetron (ZOFRAN) IV,  oxyCODONE-acetaminophen, prochlorperazine, temazepam Medications Prior to Admission:  Prior to Admission medications   Medication Sig Start Date End Date Taking? Authorizing Provider  b complex vitamins capsule Take 1 capsule by mouth daily.   Yes [provider]  Cholecalciferol (VITAMIN D3 GUMMIES) 25 MCG (1000 UT) CHEW Chew 2,000-4,000 Units by mouth daily.   Yes [provider]  docusate sodium (COLACE) 100 MG capsule Take 1 capsule (100 mg total) by mouth 2 (two) times daily as needed for mild constipation. 09/10/18 09/10/19 Yes Barton Dubois, MD  escitalopram (LEXAPRO) 20 MG tablet Take 1 tablet (20 mg total) by mouth daily. 02/12/17  Yes Raylene Everts, MD  Lysine 1000 MG TABS Take 1,000 mg by mouth daily.   Yes [provider]  magic mouthwash w/lidocaine SOLN Take 5 mLs by mouth 4 (four) times daily as needed for mouth pain. 12/01/74  Yes Delora Fuel, MD  magnesium oxide (MAG-OX) 400 MG tablet TAKE 1 TABLET BY MOUTH EVERY DAY Patient taking differently: Take 400 mg by mouth daily.  12/07/18  Yes Lockamy, Randi L, NP-C  nystatin (MYCOSTATIN) 100000 UNIT/ML suspension Take 5 mLs (500,000 Units total) by mouth 4 (four) times daily. Swish and gargle - then either spit or swallow 2/83/15  Yes Delora Fuel, MD  ondansetron (ZOFRAN-ODT) 8 MG disintegrating tablet TAKE 1 TABLET (8 MG TOTAL) BY MOUTH EVERY 8 (EIGHT) HOURS AS  NEEDED FOR NAUSEA OR VOMITING. 11/30/18  Yes Derek Jack, MD  oxyCODONE-acetaminophen (PERCOCET) 5-325 MG tablet Take 1 tablet by mouth every 4 (four) hours as needed for moderate pain. 1/76/16  Yes Delora Fuel, MD  PACLitaxel (TAXOL IV) Inject into the vein once a week.    Yes [provider]  pantoprazole (PROTONIX) 40 MG tablet TAKE 1 TABLET BY MOUTH EVERY DAY BEFORE BREAKFAST Patient taking differently: Take 40 mg by mouth daily.  11/19/18  Yes Annitta Needs, NP  Polyvinyl Alcohol-Povidone PF (REFRESH) 1.4-0.6 % SOLN Place 1 drop into both eyes as needed (for dry eyes).    Yes [provider]  potassium chloride (K-DUR) 10 MEQ tablet Take 2 tablets (20 mEq total) by mouth daily. 12/03/18  Yes Derek Jack, MD  prochlorperazine (COMPAZINE) 10 MG tablet Take 1 tablet (10 mg total) by mouth every 6 (six) hours as needed for nausea or vomiting. 09/18/18  Yes Derek Jack, MD  rizatriptan (MAXALT-MLT) 10 MG disintegrating tablet Take 10 mg by mouth every 2 (two) hours as needed for migraine.    Yes [provider]  temazepam (RESTORIL) 15 MG capsule Take 1 capsule (15 mg total) by mouth at bedtime as needed for sleep. 12/03/18  Yes Derek Jack, MD  zolpidem (AMBIEN) 10 MG tablet Take 1 tablet (10 mg total) by mouth at bedtime as needed for up to 30 days for sleep. 10/09/18 12/12/18 Yes Derek Jack, MD  oxyCODONE-acetaminophen (PERCOCET) 5-325 MG tablet Take 1 tablet by mouth every 4 (four) hours as needed for moderate pain. Patient not taking: Reported on 0/12/3708 12/03/92   Delora Fuel, MD  promethazine (PHENERGAN) 25 MG tablet Take 1 tablet (25 mg total) by mouth every 6 (six) hours as needed for nausea or vomiting. 10/02/18   Derek Jack, MD   Allergies  Allergen Reactions  . Corn-Containing Products Other (See Comments)    Headache and GI upset  . Salagen [Pilocarpine] Other (See Comments)    Can cause liver failure   .  Tape Itching and Other (See Comments)    Depending  on the [place] occurs  . Amoxicillin Hives and Other (See Comments)    Rash only DID THE REACTION INVOLVE: Swelling of the face/tongue/throat, SOB, or low BP? Sudden or severe rash/hives, skin peeling, or the inside of the mouth or nose?  Did it require medical treatment?  When did it last happen? If all above answers are "NO", may proceed with cephalosporin use.   . Caffeine Diarrhea, Nausea Only, Palpitations and Other (See Comments)    Headache  . Tetanus Toxoids Swelling and Other (See Comments)    Local reaction   Review of Systems  Constitutional: Positive for activity change and appetite change.  HENT: Positive for mouth sores.   Respiratory: Positive for shortness of breath.   Neurological: Positive for weakness.   Physical Exam Vitals signs and nursing note reviewed.  Constitutional:      Appearance: She is ill-appearing.  HENT:     Head: Normocephalic and atraumatic.     Comments: Mouth sores/mucositis Cardiovascular:     Rate and Rhythm: Normal rate.  Pulmonary:     Effort: No tachypnea, accessory muscle usage or respiratory distress.     Comments: 3L [place] Abdominal:     Tenderness: There is no abdominal tenderness.  Skin:    General: Skin is warm and dry.     Coloration: Skin is pale.  Neurological:     Mental Status: She is alert, oriented to person, place, and time and easily aroused.  Psychiatric:        Mood and Affect: Mood normal.        Speech: Speech normal.        Behavior: Behavior normal.        Cognition and Memory: Cognition normal.     Vital Signs: BP 104/82   Pulse (!) 108   Temp 98.6 F (37 C) (Oral)   Resp 17   Ht _0  (1.651 m) Comment: Simultaneous filing. User may not have seen previous data.  Wt 87.6 kg   SpO2 92%   BMI 32.14 kg/m  Pain Scale: 0-10   Pain Score: 4    SpO2: SpO2: 92 % O2 Device:SpO2: 92 % O2 Flow Rate: .O2 Flow Rate (L/min): 2 L/min   IO: Intake/output summary:   Intake/Output Summary (Last 24 hours) at 12/14/2018 1210 Last data filed at 12/14/2018 7530 Gross per 24 hour  Intake 1500 ml  Output 1350 ml  Net 150 ml    LBM: Last BM Date: 12/10/18 Baseline Weight: Weight: 74.4 kg Most recent weight: Weight: 87.6 kg     Palliative Assessment/Data: PPS 50%     Time In: 1130 Time Out: 1210 Time Total: 40 Greater than 50%  of this time was spent counseling and coordinating care related to the above assessment and plan.  Signed by:  Ihor Dow, FNP-C Palliative Medicine Team  Phone: [phone] Fax: [phone]   Please contact Palliative Medicine Team phone at [phone] for questions and concerns.  For individual provider: See Shea Evans

## 2018-12-14 NOTE — Progress Notes (Signed)
Thoracentesis complete no signs of distress. 163ml pleural fluid removed.

## 2018-12-14 NOTE — Progress Notes (Signed)
PROGRESS NOTE  Pamela Long QQP:[id number] DOB: June 07, 1971 DOA: 12/12/2018 PCP: Doree Albee, MD  Brief History:  48 year old female with a history of metastatic breast cancer to the liver, anxiety/depression, chemotherapy related neuropathy presenting with shortness of breath and nonproductive cough that began on 12/09/2018.  The patient had subjective fevers and chills.  She visited the [facility] on 12/10/2018 for her regularly scheduled chemotherapy.  It was held because of her leukopenia.  The patient was given Neupogen 480 mcg on 12/10/2018.  The patient denied any nausea, vomiting, diarrhea, dysuria, hematuria, neck pain, headache.  She denies any hemoptysis. She denies any recent travels or sick contacts.  Upon presentation, the patient was noted to have a temperature of 103.0 F with soft blood pressures.  Oxygen saturation is 100% on 3 L.  She was started on vancomycin and levofloxacin.  With an elevated d-dimer, CT angiogram of the chest was obtained and was negative for pulmonary embolus, but showed bilateral pleural effusions, left greater than right with bibasilar atelectasis.  Her lactic acid was elevated to 4.3.  Initial WBC was 1.2 with platelets 69,000.  Assessment/Plan: Neutropenic fever -Discontinue levofloxacin -Continue cefepime -ANC 176>>>1122 -Source unclear presently -UA not done -urine culture not done  Sepsis -Present on admission -Source unclear presently -Presented with leukopenia, elevated lactic acid, and fever of 103.0 F -Continue antibiotics pending culture data -Lactic acid peaked 4.3 -Procalcitonin 2.98>>>1.99  Acute respiratory failure with hypoxia -Secondary to pleural effusions -Stable on 3 L nasal cannula -Plan for thoracocentesis on 12/14/2018 -CTA chest as described above -Personally reviewed chest x-ray--bilateral pleural effusions  Bilateral pleural effusions -Concerned about malignant pleural effusions -Plan  thoracocentesis 12/14/2018 -Echocardiogram--EF 60-65%, impaired relaxation, trivial pericardial effusion -Urine protein creatinine ratio -Check BNP--97  Metastatic breast cancer -Initially diagnosed January 2017 -s/p bilateral mastectomy -06/26/2018 liver biopsy--ER/PR positive, HER-2/neu negative breast cancer -Patient follows Dr. [name] -last chemo held on 12/10/18  Leg Edema -venous duplex--pending  CBD Stricture/Obstruction -due to adenopathy -s/p biliary stent 09/22/18--Dr. Laural Golden -monitor LFTs  Depression/Anxiety -continue lexapro  Hypokalemia -replete -check mag  Hyponatremia -due to volume depletion -IVF  Mucositis -due to chemo -continue magic mouthwash with lidocaine     Disposition Plan:   Remain in SDU Family Communication: No  Family at bedside  Consultants:  none  Code Status:  FULL   DVT Prophylaxis:  SCDs   Procedures: As Listed in Progress Note Above  Antibiotics: vanc 7/4 levoflox 7/4 Cefepime 7/5>>>     Subjective: Pt complains of sores in her mouth making it difficult to eat.  Denies cp, sob at rest, n/v/d, abd pain, dysuria.  Has intermittent mild headache without focal weakness or dysarthria.  She has som DOE.  No hemoptysis  Objective: Vitals:   12/14/18 0500 12/14/18 0600 12/14/18 0700 12/14/18 0800  BP: 110/68 104/65 108/78 103/74  Pulse: (!) 106 (!) 104 (!) 103 (!) 110  Resp: 17 17 (!) 24 17  Temp: 98.6 F (37 C)     TempSrc: Oral     SpO2: 94% 96% 96% 95%  Weight: 87.6 kg     Height:        Intake/Output Summary (Last 24 hours) at 12/14/2018 1009 Last data filed at 12/14/2018 7124 Gross per 24 hour  Intake 1500 ml  Output 1350 ml  Net 150 ml   Weight change: 13.2 kg Exam:   General:  Pt is alert, follows commands appropriately, not in  acute distress  HEENT: No icterus, No thrush, No neck mass, [place]/AT  Cardiovascular: RRR, S1/S2, no rubs, no gallops  Respiratory: bibasilar crackles, no  wheeze  Abdomen: Soft/+BS, non tender, non distended, no guarding  Extremities: 1+ LLE edema, No lymphangitis, No petechiae, No rashes, no synovitis   Data Reviewed: I have personally reviewed following labs and imaging studies Basic Metabolic Panel: Recent Labs  Lab 12/10/18 0759 12/12/18 1551 12/13/18 1115 12/14/18 0443  NA 138 133* 136 134*  K 3.6 3.4* 3.2* 3.0*  CL 103 98 103 102  CO2 26 21* 23 24  GLUCOSE 104* 99 88 99  BUN _0 CREATININE 0.54 0.93 0.62 0.57  CALCIUM 8.6* 8.3* 8.0* 7.8*  MG  --   --   --  1.6*   Liver Function Tests: Recent Labs  Lab 12/10/18 0759 12/12/18 1551 12/13/18 1115 12/14/18 0443  AST 39 40 29 31  ALT _1 ALKPHOS 125 135* 131* 116  BILITOT 1.1 2.4* 1.9* 1.2  PROT 5.3* 5.7* 5.5* 5.0*  ALBUMIN 2.4* 2.4* 2.2* 1.9*   No results for input(s): LIPASE, AMYLASE in the last 168 hours. No results for input(s): AMMONIA in the last 168 hours. Coagulation Profile: Recent Labs  Lab 12/12/18 1551  INR 1.7*   CBC: Recent Labs  Lab 12/10/18 0759 12/12/18 1551 12/13/18 0616 12/14/18 0443  WBC 0.7* 1.2* 1.6* 3.4*  NEUTROABS 0.1* 0.0* 0.1* 0.8*  HGB 8.5* 9.7* 7.9* 8.1*  HCT 26.7* 29.8* 24.4* 25.1*  MCV 86.7 85.1 84.4 84.2  PLT 73* 69* 67* 89*   Cardiac Enzymes: No results for input(s): CKTOTAL, CKMB, CKMBINDEX, TROPONINI in the last 168 hours. BNP: Invalid input(s): POCBNP CBG: No results for input(s): GLUCAP in the last 168 hours. HbA1C: No results for input(s): HGBA1C in the last 72 hours. Urine analysis:    Component Value Date/Time   COLORURINE AMBER (A) 09/14/2018 1351   APPEARANCEUR CLOUDY (A) 09/14/2018 1351   LABSPEC 1.025 09/14/2018 1351   PHURINE 5.0 09/14/2018 1351   GLUCOSEU NEGATIVE 09/14/2018 1351   HGBUR NEGATIVE 09/14/2018 1351   BILIRUBINUR MODERATE (A) 09/14/2018 1351   KETONESUR 5 (A) 09/14/2018 1351   PROTEINUR 100 (A) 09/14/2018 1351   NITRITE NEGATIVE 09/14/2018 1351   LEUKOCYTESUR  NEGATIVE 09/14/2018 1351   Sepsis Labs: _2 (procalcitonin:4,lacticidven:4) ) Recent Results (from the past 240 hour(s))  Culture, blood (Routine x 2)     Status: None (Preliminary result)   Collection Time: 12/12/18  3:50 PM   Specimen: Site Not Specified; Blood  Result Value Ref Range Status   Specimen Description   Final    SITE NOT SPECIFIED BOTTLES DRAWN AEROBIC AND ANAEROBIC   Special Requests Blood Culture adequate volume  Final   Culture   Final    NO GROWTH 2 DAYS Performed at [facility], [address], [place], [place] [address]    Report Status PENDING  Incomplete  SARS Coronavirus 2 (CEPHEID- Performed in [place] hospital lab), Hosp Order     Status: None   Collection Time: 12/12/18  6:00 PM   Specimen: Nasopharyngeal Swab  Result Value Ref Range Status   SARS Coronavirus 2 NEGATIVE NEGATIVE Final    Comment: (NOTE) If result is NEGATIVE SARS-CoV-2 target nucleic acids are NOT DETECTED. The SARS-CoV-2 RNA is generally detectable in upper and lower  respiratory specimens during the acute phase of infection. The lowest  concentration of SARS-CoV-2 viral copies this assay can detect is  250  copies / mL. A negative result does not preclude SARS-CoV-2 infection  and should not be used as the sole basis for treatment or other  patient management decisions.  A negative result may occur with  improper specimen collection / handling, submission of specimen other  than nasopharyngeal swab, presence of viral mutation(s) within the  areas targeted by this assay, and inadequate number of viral copies  (<250 copies / mL). A negative result must be combined with clinical  observations, patient history, and epidemiological information. If result is POSITIVE SARS-CoV-2 target nucleic acids are DETECTED. The SARS-CoV-2 RNA is generally detectable in upper and lower  respiratory specimens dur ing the acute phase of infection.  Positive  results are indicative of active  infection with SARS-CoV-2.  Clinical  correlation with patient history and other diagnostic information is  necessary to determine patient infection status.  Positive results do  not rule out bacterial infection or co-infection with other viruses. If result is PRESUMPTIVE POSTIVE SARS-CoV-2 nucleic acids MAY BE PRESENT.   A presumptive positive result was obtained on the submitted specimen  and confirmed on repeat testing.  While 2019 novel coronavirus  (SARS-CoV-2) nucleic acids may be present in the submitted sample  additional confirmatory testing may be necessary for epidemiological  and / or clinical management purposes  to differentiate between  SARS-CoV-2 and other Sarbecovirus currently known to infect humans.  If clinically indicated additional testing with an alternate test  methodology [phone]) is advised. The SARS-CoV-2 RNA is generally  detectable in upper and lower respiratory sp ecimens during the acute  phase of infection. The expected result is Negative. Fact Sheet for Patients:  StrictlyIdeas.no Fact Sheet for Healthcare Providers: BankingDealers.co.za This test is not yet approved or cleared by the [place] FDA and has been authorized for detection and/or diagnosis of SARS-CoV-2 by FDA under an Emergency Use Authorization (EUA).  This EUA will remain in effect (meaning this test can be used) for the duration of the COVID-19 declaration under Section 564(b)(1) of the Act, 21 U.S.C. section 360bbb-3(b)(1), unless the authorization is terminated or revoked sooner. Performed at [facility], [address] [place], [place] [address]   MRSA PCR Screening     Status: None   Collection Time: 12/13/18  3:18 AM   Specimen: Nasopharyngeal  Result Value Ref Range Status   MRSA by PCR NEGATIVE NEGATIVE Final    Comment:        The GeneXpert MRSA Assay (FDA approved for NASAL specimens only), is one component of  a comprehensive MRSA colonization surveillance program. It is not intended to diagnose MRSA infection nor to guide or monitor treatment for MRSA infections. Performed at [facility], [address], [place], [place] [address]      Scheduled Meds:  escitalopram  20 mg Oral Daily   feeding supplement (PRO-STAT SUGAR FREE 64)  30 mL Oral BID   magnesium oxide  400 mg Oral Daily   nystatin  500,000 Units Oral QID   pantoprazole  40 mg Oral Daily   potassium chloride  20 mEq Oral Daily   Continuous Infusions:  ceFEPime (MAXIPIME) IV 2 g (12/14/18 0932)   lactated ringers 100 mL/hr at 12/13/18 2029    Procedures/Studies: Ct Angio Chest Pe W Or Wo Contrast  Result Date: 12/13/2018 CLINICAL DATA:  Positive D-dimer. Tachycardia. Metastatic breast cancer. Shortness of breath. EXAM: CT ANGIOGRAPHY CHEST WITH CONTRAST TECHNIQUE: Multidetector CT imaging of the chest was performed using the standard protocol  during bolus administration of intravenous contrast. Multiplanar CT image reconstructions and MIPs were obtained to evaluate the vascular anatomy. CONTRAST:  197m OMNIPAQUE IOHEXOL 350 MG/ML SOLN COMPARISON:  Chest radiography 12/12/2018.  Chest CT 09/07/2018 FINDINGS: Cardiovascular: Pulmonary arterial opacification is excellent. There are no pulmonary emboli. Heart size is normal. Small amount of pericardial fluid. No aortic pathology is seen. Mediastinum/Nodes: No pathologic hilar or mediastinal lymph nodes. Lungs/Pleura: Moderate effusion layering dependently on the right. Moderate to large effusion layering dependently on the left. Dependent pulmonary atelectasis in association with those effusions. Aerated lung does not show any evidence of metastatic disease. Upper Abdomen: Development of a large amount of ascites. Advanced upper abdominal metastatic disease as seen previously. Musculoskeletal: No fracture or lytic destructive lesion. Review of the MIP images confirms the above  findings. IMPRESSION: Negative for pulmonary emboli. Development of bilateral pleural effusions, larger on the left than the right with dependent pulmonary atelectasis. Development of ascites, in this patient with known hepatic metastatic disease. Electronically Signed   By: MNelson ChimesM.D.   On: 12/13/2018 07:04   Dg Chest Portable 1 View  Result Date: 12/12/2018 CLINICAL DATA:  Shortness of breath EXAM: PORTABLE CHEST 1 VIEW COMPARISON:  September 18, 2018 FINDINGS: The heart size and mediastinal contours are stable. Right central venous line is unchanged. There is patchy opacity of left lung base with left pleural effusion. The right lung is clear. The visualized skeletal structures are unremarkable. IMPRESSION: Patchy consolidation of left lung base pneumonia is not excluded. Left pleural effusion is noted. Electronically Signed   By: WAbelardo DieselM.D.   On: 12/12/2018 16:54    DOrson Eva DO  [facility] Pager [phone] If 7PM-7AM, please contact night-coverage [email or web address] Password [email or web address] 12/14/2018, 10:09 AM   LOS: 2 days

## 2018-12-14 NOTE — Procedures (Signed)
PreOperative Dx: LEFT pleural effusion Postoperative Dx: LEFT pleural effusion Procedure:   US guided LEFT thoracentesis Radiologist:  Thornton Papas Anesthesia:  10 ml of 1% lidocaine Specimen:  120 mL of yelllow colored fluid EBL:   < 1 ml Complications: None

## 2018-12-15 DIAGNOSIS — Z7189 Other specified counseling: Secondary | ICD-10-CM

## 2018-12-15 LAB — CBC WITH DIFFERENTIAL/PLATELET
Abs Immature Granulocytes: 0.89 10*3/uL — ABNORMAL HIGH (ref 0.00–0.07)
Basophils Absolute: 0 10*3/uL (ref 0.0–0.1)
Basophils Relative: 0 %
Eosinophils Absolute: 0 10*3/uL (ref 0.0–0.5)
Eosinophils Relative: 0 %
HCT: 23.7 % — ABNORMAL LOW (ref 36.0–46.0)
Hemoglobin: 7.7 g/dL — ABNORMAL LOW (ref 12.0–15.0)
Immature Granulocytes: 13 %
Lymphocytes Relative: 16 %
Lymphs Abs: 1 10*3/uL (ref 0.7–4.0)
MCH: 27.1 pg (ref 26.0–34.0)
MCHC: 32.5 g/dL (ref 30.0–36.0)
MCV: 83.5 fL (ref 80.0–100.0)
Monocytes Absolute: 1.5 10*3/uL — ABNORMAL HIGH (ref 0.1–1.0)
Monocytes Relative: 23 %
Neutro Abs: 3.2 10*3/uL (ref 1.7–7.7)
Neutrophils Relative %: 48 %
Platelets: 90 10*3/uL — ABNORMAL LOW (ref 150–400)
RBC: 2.84 MIL/uL — ABNORMAL LOW (ref 3.87–5.11)
RDW: 16.6 % — ABNORMAL HIGH (ref 11.5–15.5)
WBC: 6.7 10*3/uL (ref 4.0–10.5)
nRBC: 0.4 % — ABNORMAL HIGH (ref 0.0–0.2)

## 2018-12-15 LAB — COMPREHENSIVE METABOLIC PANEL
ALT: 21 U/L (ref 0–44)
AST: 43 U/L — ABNORMAL HIGH (ref 15–41)
Albumin: 1.9 g/dL — ABNORMAL LOW (ref 3.5–5.0)
Alkaline Phosphatase: 136 U/L — ABNORMAL HIGH (ref 38–126)
Anion gap: 5 (ref 5–15)
BUN: 12 mg/dL (ref 6–20)
CO2: 26 mmol/L (ref 22–32)
Calcium: 7.8 mg/dL — ABNORMAL LOW (ref 8.9–10.3)
Chloride: 105 mmol/L (ref 98–111)
Creatinine, Ser: 0.54 mg/dL (ref 0.44–1.00)
GFR calc Af Amer: >60 mL/min (ref >60)
GFR calc non Af Amer: >60 mL/min (ref >60)
Glucose, Bld: 89 mg/dL (ref 70–99)
Potassium: 3.1 mmol/L — ABNORMAL LOW (ref 3.5–5.1)
Sodium: 136 mmol/L (ref 135–145)
Total Bilirubin: 1.2 mg/dL (ref 0.3–1.2)
Total Protein: 4.6 g/dL — ABNORMAL LOW (ref 6.5–8.1)

## 2018-12-15 LAB — URINALYSIS, COMPLETE (UACMP) WITH MICROSCOPIC
Bacteria, UA: NONE SEEN
Bilirubin Urine: NEGATIVE
Glucose, UA: NEGATIVE mg/dL
Hgb urine dipstick: NEGATIVE
Ketones, ur: NEGATIVE mg/dL
Leukocytes,Ua: NEGATIVE
Nitrite: NEGATIVE
Protein, ur: NEGATIVE mg/dL
Specific Gravity, Urine: 1.03 (ref 1.005–1.030)
pH: 5 (ref 5.0–8.0)

## 2018-12-15 LAB — PROCALCITONIN: Procalcitonin: 0.94 ng/mL

## 2018-12-15 LAB — HIV ANTIBODY (ROUTINE TESTING W REFLEX): HIV Screen 4th Generation wRfx: NONREACTIVE

## 2018-12-15 LAB — PROTEIN / CREATININE RATIO, URINE
Creatinine, Urine: 96.61 mg/dL
Protein Creatinine Ratio: 0.2 mg/mg{Cre} — ABNORMAL HIGH (ref 0.00–0.15)
Total Protein, Urine: 19 mg/dL

## 2018-12-15 LAB — STREP PNEUMONIAE URINARY ANTIGEN: Strep Pneumo Urinary Antigen: NEGATIVE

## 2018-12-15 MED ORDER — DIPHENHYDRAMINE HCL 25 MG PO CAPS
25.0000 mg | ORAL_CAPSULE | Freq: Every evening | ORAL | Status: DC | PRN
  Administered 2018-12-16: 02:00:00 25 mg via ORAL
  Filled 2018-12-15: qty 1

## 2018-12-15 MED ORDER — ACETAMINOPHEN 325 MG PO TABS
650.0000 mg | ORAL_TABLET | Freq: Four times a day (QID) | ORAL | Status: DC | PRN
  Administered 2018-12-15: 650 mg via ORAL
  Filled 2018-12-15: qty 2

## 2018-12-15 MED ORDER — MAGNESIUM SULFATE 2 GM/50ML IV SOLN
2.0000 g | Freq: Once | INTRAVENOUS | Status: AC
  Administered 2018-12-15: 13:00:00 2 g via INTRAVENOUS
  Filled 2018-12-15: qty 50

## 2018-12-15 MED ORDER — SENNA 8.6 MG PO TABS
2.0000 | ORAL_TABLET | Freq: Every day | ORAL | Status: DC
  Administered 2018-12-15 – 2018-12-16 (×2): 17.2 mg via ORAL
  Filled 2018-12-15 (×2): qty 2

## 2018-12-15 MED ORDER — LEVOFLOXACIN 500 MG PO TABS: 500.0000 mg | ORAL_TABLET | ORAL | Status: DC

## 2018-12-15 MED ORDER — LORATADINE 10 MG PO TABS
10.0000 mg | ORAL_TABLET | Freq: Every day | ORAL | Status: DC
  Administered 2018-12-15 – 2018-12-16 (×2): 10 mg via ORAL
  Filled 2018-12-15 (×2): qty 1

## 2018-12-15 MED ORDER — CAMPHOR-MENTHOL 0.5-0.5 % EX LOTN
TOPICAL_LOTION | CUTANEOUS | Status: DC | PRN
  Filled 2018-12-15: qty 222

## 2018-12-15 MED ORDER — POLYETHYLENE GLYCOL 3350 17 G PO PACK
17.0000 g | PACK | Freq: Every day | ORAL | Status: DC
  Administered 2018-12-15 – 2018-12-16 (×2): 17 g via ORAL
  Filled 2018-12-15 (×2): qty 1

## 2018-12-15 MED ORDER — LEVOFLOXACIN 500 MG PO TABS
500.0000 mg | ORAL_TABLET | ORAL | Status: DC
  Administered 2018-12-15: 500 mg via ORAL
  Filled 2018-12-15: qty 1

## 2018-12-15 NOTE — ACP (Advance Care Planning) (Signed)
Brought Pamela Long Advance Directive information packet/contact info. She was resting. Will check back later today or tomorrow.

## 2018-12-15 NOTE — Progress Notes (Signed)
Daily Progress Note   Patient Name: Pamela Long       Date: 12/15/2018 DOB: 11-Apr-1971  Age: 48 y.o. MRN#: [id number] Attending Physician: Orson Eva, MD Primary Care Physician: Doree Albee, MD Admit Date: 12/12/2018  Reason for Consultation/Follow-up: Establishing goals of care  Subjective: Patient awake, alert, oriented. In better spirits this morning. Denies current pain including better pain management of oral ulcerations. Pamela Long was glad that she could tolerate breakfast this morning.   GOC:   Again introduced Palliative Medicine as specialized medical care for people living with serious illness. It focuses on providing relief from the symptoms and stress of a serious illness. The goal is to improve quality of life for both the patient and the family.  We discussed a brief life review of the patient. She is from [place] and moved from [place] to [place] in 2018. She misses [place] but does not miss the cold. She lives with her 12yo daughter, Pamela Long. 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   Discussed her journey with metastatic cancer, initially diagnosed in [place] in 2017. Explored her understanding of diagnoses, options and possible prognosis from her conversations with oncology. Pamela Long shares that she has not been given a prognosis and that there are 'many other options' if necessary after this current regimen per her conversation with oncologist. Encouraged ongoing discussions with her oncologist throughout this journey, in regards to what to expect moving forward and especially with recent disease progression from scans in February.   Discussed events leading up to admission and course of hospitalization including diagnoses,  interventions, and plan of care.   Pamela Long states "I want to get better and I want to get stronger." She is very motivated to work with physical therapy and is willing to discharge to SNF for short-term rehab if necessary. Whatever is necessary to regain her strength and get her back home. She has the desire to eat but has been challenging to maintain oral intake with oral ulcerations.   Advanced directives, concepts specific to code status, artifical feeding and hydration were discussed. Patient requests we review AD packet and has been wanting to complete. Reviewed AD packet and MOST form. Patient verbalizes her current desire for FULL code/FULL scope treatment but  further explains that if she was on a ventilator, with no chance of 'meaningful' recovery, she would NOT want prolonged, heroic interventions. She would NOT want to be like a "vegetable" or a burden on her 12yo daughter. Pamela Long shares that she has had some discussions with her daughter regarding wishes. Encouraged ongoing discussions with her daughter and family regarding EOL wishes and goals. She does have other family members who reside in [place], but only sees every few years.   Patient is interesting in completing AD packet this admission if possible. Spiritual consult placed.   Patient is interesting in outpatient palliative referral. Explained that unfortunately outpatient palliative services are limited in [place] but that I would reach out to RN CM to see if [facility] has a contract with another outpatient palliative agency.   Questions and concerns were addressed. Hard Choices booklet and PMT contact information given. Emotional support provided.    Length of Stay: 3  Current Medications: Scheduled Meds:   escitalopram  20 mg Oral Daily   feeding supplement (PRO-STAT SUGAR FREE 64)  30 mL Oral BID   magic mouthwash  5 mL Oral QID   And   lidocaine  15 mL Mouth/Throat QID   magnesium oxide  400 mg Oral Daily    nystatin  500,000 Units Oral QID   pantoprazole  40 mg Oral Daily   potassium chloride  20 mEq Oral Daily    Continuous Infusions:  ceFEPime (MAXIPIME) IV Stopped (12/15/18 0213)   lactated ringers 100 mL/hr at 12/15/18 0630    PRN Meds: docusate sodium, ondansetron (ZOFRAN) IV, oxyCODONE-acetaminophen, prochlorperazine, temazepam  Physical Exam Vitals signs and nursing note reviewed.  Constitutional:      General: She is awake.  HENT:     Head: Normocephalic and atraumatic.  Cardiovascular:     Rate and Rhythm: Normal rate.  Pulmonary:     Effort: No tachypnea, accessory muscle usage or respiratory distress.     Comments: Room Air Skin:    General: Skin is warm and dry.     Coloration: Skin is pale.  Neurological:     Mental Status: She is alert and oriented to person, place, and time.  Psychiatric:        Mood and Affect: Mood normal.        Speech: Speech normal.        Behavior: Behavior normal.        Cognition and Memory: Cognition normal.             Vital Signs: BP 102/71    Pulse 89    Temp 98.2 F (36.8 C) (Oral)    Resp 13    Ht 5\' 5"  (1.651 m) Comment: Simultaneous filing. User may not have seen previous data.   Wt 94 kg    SpO2 96%    BMI 34.49 kg/m  SpO2: SpO2: 96 % O2 Device: O2 Device: Nasal Cannula O2 Flow Rate: O2 Flow Rate (L/min): 2 L/min  Intake/output summary:   Intake/Output Summary (Last 24 hours) at 12/15/2018 1012 Last data filed at 12/15/2018 0630 Gross per 24 hour  Intake 3526.66 ml  Output 500 ml  Net 3026.66 ml   LBM: Last BM Date: 12/10/18 Baseline Weight: Weight: 74.4 kg Most recent weight: Weight: 94 kg       Palliative Assessment/Data: PPS 50%      Patient Active Problem List   Diagnosis Date Noted   Palliative care by specialist    Community acquired pneumonia  of left lung    Acute on chronic respiratory failure with hypoxia ([place]) 12/13/2018   Sepsis due to undetermined organism ([place]) 12/13/2018    Pleural effusion on left    HCAP (healthcare-associated pneumonia) 12/12/2018   Febrile neutropenia ([place]) 12/12/2018   Hypokalemia 12/12/2018   Hyponatremia 12/12/2018   Severe protein-calorie malnutrition ([place]) 12/12/2018   Sepsis ([place]) 12/12/2018   Obstructive jaundice 09/21/2018   Goals of care, counseling/discussion 09/17/2018   Abdominal pain 09/09/2018   RUQ pain 07/08/2018   Metastatic breast cancer ([place]) 07/02/2018   Liver mass 06/27/2018   GERD (gastroesophageal reflux disease) 04/02/2018   Rectal bleeding 04/02/2018   Constipation 04/02/2018   Acquired absence of left breast 08/01/2017   OAB (overactive bladder) 07/31/2017   Vitamin D deficiency 02/26/2017   Prediabetes 02/26/2017   HLD (hyperlipidemia) 02/26/2017   Asthma 02/12/2017   Depression 02/12/2017   Migraines 02/12/2017   Arthralgia 02/12/2017   Sleep-disordered breathing 02/12/2017   Positive ANA (antinuclear antibody) 02/12/2017   Angular cheilitis 02/12/2017   Class 2 obesity due to excess calories without serious comorbidity with body mass index (BMI) of 35.0 to 35.9 in adult 02/12/2017   Malignant neoplasm of central portion of left breast in female, estrogen receptor positive ([place]) 01/05/2017    Palliative Care Assessment & Plan   Patient Profile: 48 y.o. 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 Followed by [facility]/Dr. [name], last seen 12/10/18 when chemotherapy was held due to leukopenia. Hospital admission for neutropenic fever, sepsis with lactic acid 4.3 on admit, acute respiratory failure with hypoxia requiring 3L [place], bilateral pleural effusions. Pending thoracentesis and venous duplex, scheduled for 7/6 afternoon. Palliative medicine consultation for goals of care.    Assessment: Metastatic breast cancer Neutropenic fever Sepsis Acute respiratory failure with hypoxia Bilateral pleural effusions Bilateral leg edema--negative DVT Mucositis Anxiety Depression Hyponatremia Hypokalemia  Recommendations/Plan:  Patient wishes for ongoing FULL code/FULL scope treatment. She wishes to 'get better and get stronger.' She is eager to work with physical therapy today and is willing to discharge to SNF for rehab if absolutely necessary.   Encouraged ongoing discussions with oncologist regarding expectations with diagnoses, disease progression, options, and prognosis.   Patient interested in completing AD/HCPOA packet. Spiritual consult placed.   Patient interested in outpatient palliative referral. Unfortunately, limited in [facility] but will ask [facility] CM/SW look into options.   Continue current symptom management medications.    Code Status: FULL   Code Status Orders  (From admission, onward)         Start     Ordered   12/12/18 2019  Full code  Continuous     12/12/18 2022        Code Status History    Date Active Date Inactive Code Status Order ID Comments User Context   09/10/2018 0039 09/10/2018 1639 Full Code [id number]  Oswald Hillock, MD Inpatient   Advance Care Planning Activity       Prognosis:   Unable to determine  Discharge Planning:  To Be Determined  Care plan was discussed with patient, Dr Tat, RN  Thank you for allowing the Palliative Medicine Team to assist in the care of this patient.   Time In: 1015 Time Out: 1125 Total Time 70 Prolonged Time Billed  yes      Greater than 50%  of this time  was spent counseling and coordinating care related to the above assessment and plan.  Ihor Dow, FNP-C Palliative Medicine Team  Phone: [phone] Fax: [phone]  Please contact Palliative Medicine Team phone at [phone] for questions and concerns.

## 2018-12-15 NOTE — Progress Notes (Signed)
         PROGRESS NOTE  Pamela Long MRN:[id number] DOB: 09/17/1970 DOA: 12/12/2018 PCP: Gosrani, Nimish C, MD  Brief History: 47-year-old female with a history of metastatic breast cancer to the liver, anxiety/depression, chemotherapy related neuropathy presenting with shortness of breath and nonproductive cough that began on 12/09/2018. The patient had subjective fevers and chills. She visited the [facility] on 12/10/2018 for her regularly scheduled chemotherapy. It was held because of her leukopenia. The patient was given Neupogen 480 mcg on 12/10/2018. The patient denied any nausea, vomiting, diarrhea, dysuria, hematuria, neck pain, headache. She denies any hemoptysis. She denies any recent travels or sick contacts.Upon presentation, the patient was noted to have a temperature of 103.0 F with soft blood pressures. Oxygen saturation is 100% on 3 L. She was started on vancomycin and levofloxacin. With an elevated d-dimer, CT angiogram of the chest was obtained and was negative for pulmonary embolus, but showed bilateral pleural effusions, left greater than right with bibasilar atelectasis. Her lactic acid was elevated to 4.3. Initial WBC was 1.2 with platelets 69,000.  Assessment/Plan: Neutropenic fever -disContinue cefepime -levofloxacin with last dose 12/16/18 to complete 5 days abs -blood cultures and pleural fluid cultures neg -ANC 176>>>1122 -Source unclear presently -UA not done -urine culture not done  Sepsis -Present on admission -Source unclear--undifferentiated -Presented with leukopenia, elevated lactic acid, and fever of 103.0 F -Continue antibiotics pending culture data -Lactic acid peaked 4.3 -Procalcitonin 2.98>>>1.99  Acute respiratory failure with hypoxia -Secondary to pleural effusions -Stable on 3 L nasal cannula>>>RA -thoracocentesis on 12/14/2018--only able to remove 120 cc -CTA chest as described above -Personally reviewed chest  x-ray--bilateral pleural effusions  Bilateral pleural effusions -Lights Criteria suggests transudate -suspect due to hypoalbuminemia -thoracocentesis 12/14/2018--only able to remove 120 cc -Echocardiogram--EF 60-65%, impaired relaxation, trivial pericardial effusion -Urine protein creatinine ratio--0.22 -Check BNP--97  Metastatic breast cancer -Initially diagnosed January 2017 -s/p bilateral mastectomy -1/17/2020liver biopsy--ER/PR positive, HER-2/neu negative breast cancer -Patient followsDr. 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 Rehman -monitor LFTs  Depression/Anxiety -continue lexapro  Hypokalemia/Hypomagnesemia -replete  Hyponatremia -due to volume depletion -IVF  Mucositis -due to chemo -continue magic mouthwash with lidocaine     Disposition Plan:Home vs SNF 7/8 Family Communication:NoFamily at bedside  Consultants:none  Code Status: FULL   DVT Prophylaxis: SCDs   Procedures: As Listed in Progress Note Above  Antibiotics: vanc7/4 levoflox7/4 Cefepime 7/5>>>7/7 levoflox 7/7    Subjective: Still having dyspnea on exertion.  Still complains of mouth pain, but slowly improving.  Denies cp, f/c, headache, n/v/d, abd pain.  Objective: Vitals:   12/15/18 0200 12/15/18 0300 12/15/18 0400 12/15/18 0500  BP: (!) 96/58 111/73 94/63 102/71  Pulse: 96 95 95 89  Resp: (!) 8 16 12 13  Temp:   98.2 F (36.8 C)   TempSrc:   Oral   SpO2: 94% 94% 96% 96%  Weight:    94 kg  Height:        Intake/Output Summary (Last 24 hours) at 12/15/2018 1018 Last data filed at 12/15/2018 0630 Gross per 24 hour  Intake 3526.66 ml  Output 500 ml  Net 3026.66 ml   Weight change: 6.4 kg Exam:   General:  Pt is alert, follows commands appropriately, not in acute distress  HEENT: No icterus, No thrush, No neck mass,  [place]/AT  Cardiovascular: RRR, S1/S2, no rubs, no gallops  Respiratory: bibasilar crackles, no   wheeze  Abdomen: Soft/+BS, non tender, non distended, no guarding  Extremities: 2 +LE edema, No lymphangitis, No petechiae, No rashes, no synovitis   Data Reviewed: I have personally reviewed following labs and imaging studies Basic Metabolic Panel: Recent Labs  Lab 12/10/18 0759 12/12/18 1551 12/13/18 1115 12/14/18 0443 12/15/18 0527  NA 138 133* 136 134* 136  K 3.6 3.4* 3.2* 3.0* 3.1*  CL 103 98 103 102 105  CO2 26 21* 23 24 26  GLUCOSE 104* 99 88 99 89  BUN 9 11 13 15 12  CREATININE 0.54 0.93 0.62 0.57 0.54  CALCIUM 8.6* 8.3* 8.0* 7.8* 7.8*  MG  --   --   --  1.6*  --    Liver Function Tests: Recent Labs  Lab 12/10/18 0759 12/12/18 1551 12/13/18 1115 12/14/18 0443 12/15/18 0527  AST 39 40 29 31 43*  ALT 21 22 21 19 21  ALKPHOS 125 135* 131* 116 136*  BILITOT 1.1 2.4* 1.9* 1.2 1.2  PROT 5.3* 5.7* 5.5* 5.0* 4.6*  ALBUMIN 2.4* 2.4* 2.2* 1.9* 1.9*   No results for input(s): LIPASE, AMYLASE in the last 168 hours. No results for input(s): AMMONIA in the last 168 hours. Coagulation Profile: Recent Labs  Lab 12/12/18 1551  INR 1.7*   CBC: Recent Labs  Lab 12/10/18 0759 12/12/18 1551 12/13/18 0616 12/14/18 0443 12/15/18 0527  WBC 0.7* 1.2* 1.6* 3.4* 6.7  NEUTROABS 0.1* 0.0* 0.1* 0.8* 3.2  HGB 8.5* 9.7* 7.9* 8.1* 7.7*  HCT 26.7* 29.8* 24.4* 25.1* 23.7*  MCV 86.7 85.1 84.4 84.2 83.5  PLT 73* 69* 67* 89* 90*   Cardiac Enzymes: No results for input(s): CKTOTAL, CKMB, CKMBINDEX, TROPONINI in the last 168 hours. BNP: Invalid input(s): POCBNP CBG: No results for input(s): GLUCAP in the last 168 hours. HbA1C: No results for input(s): HGBA1C in the last 72 hours. Urine analysis:    Component Value Date/Time   COLORURINE YELLOW 12/15/2018 0158   APPEARANCEUR CLEAR 12/15/2018 0158   LABSPEC 1.030 12/15/2018 0158   PHURINE 5.0 12/15/2018 0158   GLUCOSEU  NEGATIVE 12/15/2018 0158   HGBUR NEGATIVE 12/15/2018 0158   BILIRUBINUR NEGATIVE 12/15/2018 0158   KETONESUR NEGATIVE 12/15/2018 0158   PROTEINUR NEGATIVE 12/15/2018 0158   NITRITE NEGATIVE 12/15/2018 0158   LEUKOCYTESUR NEGATIVE 12/15/2018 0158   Sepsis Labs: @LABRCNTIP(procalcitonin:4,lacticidven:4) ) Recent Results (from the past 240 hour(s))  Culture, blood (Routine x 2)     Status: None (Preliminary result)   Collection Time: 12/12/18  3:50 PM   Specimen: Site Not Specified; Blood  Result Value Ref Range Status   Specimen Description   Final    SITE NOT SPECIFIED BOTTLES DRAWN AEROBIC AND ANAEROBIC   Special Requests Blood Culture adequate volume  Final   Culture   Final    NO GROWTH 3 DAYS Performed at [facility], [address] [place], [place] [address]    Report Status PENDING  Incomplete  SARS Coronavirus 2 (CEPHEID- Performed in [place] hospital lab), Hosp Order     Status: None   Collection Time: 12/12/18  6:00 PM   Specimen: Nasopharyngeal Swab  Result Value Ref Range Status   SARS Coronavirus 2 NEGATIVE NEGATIVE Final    Comment: (NOTE) If result is NEGATIVE SARS-CoV-2 target nucleic acids are NOT DETECTED. The SARS-CoV-2 RNA is generally detectable in upper and lower  respiratory specimens during the acute phase of infection. The lowest  concentration of SARS-CoV-2 viral copies this assay can detect is 250    copies / mL. A negative result does not preclude SARS-CoV-2 infection  and should not be used as the sole basis for treatment or other  patient management decisions.  A negative result may occur with  improper specimen collection / handling, submission of specimen other  than nasopharyngeal swab, presence of viral mutation(s) within the  areas targeted by this assay, and inadequate number of viral copies  (<250 copies / mL). A negative result must be combined with clinical  observations, patient history, and epidemiological information. If result is  POSITIVE SARS-CoV-2 target nucleic acids are DETECTED. The SARS-CoV-2 RNA is generally detectable in upper and lower  respiratory specimens dur ing the acute phase of infection.  Positive  results are indicative of active infection with SARS-CoV-2.  Clinical  correlation with patient history and other diagnostic information is  necessary to determine patient infection status.  Positive results do  not rule out bacterial infection or co-infection with other viruses. If result is PRESUMPTIVE POSTIVE SARS-CoV-2 nucleic acids MAY BE PRESENT.   A presumptive positive result was obtained on the submitted specimen  and confirmed on repeat testing.  While 2019 novel coronavirus  (SARS-CoV-2) nucleic acids may be present in the submitted sample  additional confirmatory testing may be necessary for epidemiological  and / or clinical management purposes  to differentiate between  SARS-CoV-2 and other Sarbecovirus currently known to infect humans.  If clinically indicated additional testing with an alternate test  methodology (LAB7453) is advised. The SARS-CoV-2 RNA is generally  detectable in upper and lower respiratory sp ecimens during the acute  phase of infection. The expected result is Negative. Fact Sheet for Patients:  https://www.fda.gov/media/136312/download Fact Sheet for Healthcare Providers: https://www.fda.gov/media/136313/download This test is not yet approved or cleared by the [place] FDA and has been authorized for detection and/or diagnosis of SARS-CoV-2 by FDA under an Emergency Use Authorization (EUA).  This EUA will remain in effect (meaning this test can be used) for the duration of the COVID-19 declaration under Section 564(b)(1) of the Act, 21 U.S.C. section 360bbb-3(b)(1), unless the authorization is terminated or revoked sooner. Performed at [facility], [address] [place], [place] [address]   MRSA PCR Screening     Status: None   Collection Time: 12/13/18   3:18 AM   Specimen: Nasopharyngeal  Result Value Ref Range Status   MRSA by PCR NEGATIVE NEGATIVE Final    Comment:        The GeneXpert MRSA Assay (FDA approved for NASAL specimens only), is one component of a comprehensive MRSA colonization surveillance program. It is not intended to diagnose MRSA infection nor to guide or monitor treatment for MRSA infections. Performed at [facility], [address] [place], [place] [address]   Culture, body fluid-bottle     Status: None (Preliminary result)   Collection Time: 12/14/18  1:00 PM   Specimen: Pleura  Result Value Ref Range Status   Specimen Description PLEURAL  Final   Special Requests 10CC  Final   Culture   Final    NO GROWTH < 24 HOURS Performed at [facility], [address] [place], [place] [address]    Report Status PENDING  Incomplete  Gram stain     Status: None   Collection Time: 12/14/18  1:00 PM   Specimen: Pleura  Result Value Ref Range Status   Specimen Description PLEURAL  Final   Special Requests NONE  Final   Gram Stain   Final    FEW WBC   PRESENT, PREDOMINANTLY MONONUCLEAR NO ORGANISMS SEEN CYTOSPIN SMEAR Performed at [facility], [address] [place], [place] [address]    Report Status 12/14/2018 FINAL  Final     Scheduled Meds: . escitalopram  20 mg Oral Daily  . feeding supplement (PRO-STAT SUGAR FREE 64)  30 mL Oral BID  . magic mouthwash  5 mL Oral QID   And  . lidocaine  15 mL Mouth/Throat QID  . loratadine  10 mg Oral Daily  . magnesium oxide  400 mg Oral Daily  . nystatin  500,000 Units Oral QID  . pantoprazole  40 mg Oral Daily  . potassium chloride  20 mEq Oral Daily   Continuous Infusions: . ceFEPime (MAXIPIME) IV Stopped (12/15/18 0213)  . lactated ringers 100 mL/hr at 12/15/18 0630    Procedures/Studies: Ct Angio Chest Pe W Or Wo Contrast  Result Date: 12/13/2018 CLINICAL DATA:  Positive D-dimer. Tachycardia. Metastatic breast cancer. Shortness of breath. EXAM: CT  ANGIOGRAPHY CHEST WITH CONTRAST TECHNIQUE: Multidetector CT imaging of the chest was performed using the standard protocol during bolus administration of intravenous contrast. Multiplanar CT image reconstructions and MIPs were obtained to evaluate the vascular anatomy. CONTRAST:  100mL OMNIPAQUE IOHEXOL 350 MG/ML SOLN COMPARISON:  Chest radiography 12/12/2018.  Chest CT 09/07/2018 FINDINGS: Cardiovascular: Pulmonary arterial opacification is excellent. There are no pulmonary emboli. Heart size is normal. Small amount of pericardial fluid. No aortic pathology is seen. Mediastinum/Nodes: No pathologic hilar or mediastinal lymph nodes. Lungs/Pleura: Moderate effusion layering dependently on the right. Moderate to large effusion layering dependently on the left. Dependent pulmonary atelectasis in association with those effusions. Aerated lung does not show any evidence of metastatic disease. Upper Abdomen: Development of a large amount of ascites. Advanced upper abdominal metastatic disease as seen previously. Musculoskeletal: No fracture or lytic destructive lesion. Review of the MIP images confirms the above findings. IMPRESSION: Negative for pulmonary emboli. Development of bilateral pleural effusions, larger on the left than the right with dependent pulmonary atelectasis. Development of ascites, in this patient with known hepatic metastatic disease. Electronically Signed   By: Mark  Shogry M.D.   On: 12/13/2018 07:04   Us Venous Img Lower Bilateral  Result Date: 12/14/2018 CLINICAL DATA:  47-year-old female with a history right thigh pain EXAM: BILATERAL LOWER EXTREMITY VENOUS DOPPLER ULTRASOUND TECHNIQUE: Gray-scale sonography with graded compression, as well as color Doppler and duplex ultrasound were performed to evaluate the lower extremity deep venous systems from the level of the common femoral vein and including the common femoral, femoral, profunda femoral, popliteal and calf veins including the posterior  tibial, peroneal and gastrocnemius veins when visible. The superficial great saphenous vein was also interrogated. Spectral Doppler was utilized to evaluate flow at rest and with distal augmentation maneuvers in the common femoral, femoral and popliteal veins. COMPARISON:  None. FINDINGS: RIGHT LOWER EXTREMITY Common Femoral Vein: No evidence of thrombus. Normal compressibility, respiratory phasicity and response to augmentation. Saphenofemoral Junction: No evidence of thrombus. Normal compressibility and flow on color Doppler imaging. Profunda Femoral Vein: No evidence of thrombus. Normal compressibility and flow on color Doppler imaging. Femoral Vein: No evidence of thrombus. Normal compressibility, respiratory phasicity and response to augmentation. Popliteal Vein: No evidence of thrombus. Normal compressibility, respiratory phasicity and response to augmentation. Calf Veins: No evidence of thrombus. Normal compressibility and flow on color Doppler imaging. Superficial Great Saphenous Vein: No evidence of thrombus. Normal compressibility and flow on color Doppler imaging. Other Findings:  Edema LEFT LOWER EXTREMITY Common Femoral   Vein: No evidence of thrombus. Normal compressibility, respiratory phasicity and response to augmentation. Saphenofemoral Junction: No evidence of thrombus. Normal compressibility and flow on color Doppler imaging. Profunda Femoral Vein: No evidence of thrombus. Normal compressibility and flow on color Doppler imaging. Femoral Vein: No evidence of thrombus. Normal compressibility, respiratory phasicity and response to augmentation. Popliteal Vein: No evidence of thrombus. Normal compressibility, respiratory phasicity and response to augmentation. Calf Veins: No evidence of thrombus. Normal compressibility and flow on color Doppler imaging. Superficial Great Saphenous Vein: No evidence of thrombus. Normal compressibility and flow on color Doppler imaging. Other Findings:  Edema  IMPRESSION: Sonographic survey of the bilateral lower extremities negative for DVT. Edema Electronically Signed   By: Jaime  Wagner D.O.   On: 12/14/2018 14:07   Dg Chest Port 1 View  Result Date: 12/14/2018 CLINICAL DATA:  LEFT pleural effusion post thoracentesis EXAM: PORTABLE CHEST 1 VIEW COMPARISON:  Expiratory portable chest radiograph 1354 hours compared to 12/12/2018 FINDINGS: RIGHT arm PICC line with tip projecting over SVC. Enlargement of cardiac silhouette with vascular congestion. Bibasilar atelectasis and small pleural effusions. No pneumothorax following thoracentesis. IMPRESSION: No pneumothorax following thoracentesis. Persistent bibasilar atelectasis and small pleural effusions. Electronically Signed   By: Mark  Boles M.D.   On: 12/14/2018 14:11   Dg Chest Portable 1 View  Result Date: 12/12/2018 CLINICAL DATA:  Shortness of breath EXAM: PORTABLE CHEST 1 VIEW COMPARISON:  September 18, 2018 FINDINGS: The heart size and mediastinal contours are stable. Right central venous line is unchanged. There is patchy opacity of left lung base with left pleural effusion. The right lung is clear. The visualized skeletal structures are unremarkable. IMPRESSION: Patchy consolidation of left lung base pneumonia is not excluded. Left pleural effusion is noted. Electronically Signed   By: Wei-Chen  Lin M.D.   On: 12/12/2018 16:54   Us Thoracentesis Asp Pleural Space W/img Guide  Result Date: 12/14/2018 INDICATION: BILATERAL pleural effusions, metastatic breast cancer EXAM: ULTRASOUND GUIDED DIAGNOSTIC LEFT THORACENTESIS MEDICATIONS: None. COMPLICATIONS: None immediate. PROCEDURE: Procedure, benefits, and risks of procedure were discussed with patient. Written informed consent for procedure was obtained. Time out protocol followed. Pleural effusion localized by ultrasound at the posterior LEFT hemithorax. Skin prepped and draped in usual sterile fashion. Skin and soft tissues anesthetized with 10 mL of 1%  lidocaine. 8 French thoracentesis catheter placed into the LEFT pleural space. 120 mL of yellow LEFT pleural fluid aspirated by syringe pump. Procedure tolerated well by patient without immediate complication. Fluid appeared mildly complicated by preprocedural imaging. FINDINGS: A total of approximately 120 mL of LEFT pleural fluid was removed. Samples were sent to the laboratory as requested by the clinical team. IMPRESSION: Successful ultrasound guided LEFT thoracentesis yielding 120 mL of pleural fluid. Electronically Signed   By: Mark  Boles M.D.   On: 12/14/2018 14:14     , DO  [facility] Pager [phone]  If 7PM-7AM, please contact night-coverage [email or web address] Password [email or web address] 12/15/2018, 10:18 AM   LOS: 3 days   

## 2018-12-15 NOTE — NC FL2 (Signed)
[place] LEVEL OF CARE SCREENING TOOL     IDENTIFICATION  Patient Name: Pamela Long Birthdate: [birth year] Sex: female Admission Date (Current Location): 12/12/2018  [facility] and [place] Number:  [employer] and Address:  [place] [address] [place]      Provider Number: [id number]  Attending Physician Name and Address:  Orson Eva, MD  Relative Name and Phone Number:       Current Level of Care: Hospital Recommended Level of Care: [place] Prior Approval Number:    Date Approved/Denied:   PASRR Number: [id number]  Discharge Plan: SNF    Current Diagnoses: Patient Active Problem List   Diagnosis Date Noted  . Palliative care by specialist   . Community acquired pneumonia of left lung   . Acute on chronic respiratory failure with hypoxia ([place]) 12/13/2018  . Sepsis due to undetermined organism ([place]) 12/13/2018  . Pleural effusion on left   . HCAP (healthcare-associated pneumonia) 12/12/2018  . Febrile neutropenia ([place]) 12/12/2018  . Hypokalemia 12/12/2018  . Hyponatremia 12/12/2018  . Severe protein-calorie malnutrition ([place]) 12/12/2018  . Sepsis ([place]) 12/12/2018  . Obstructive jaundice 09/21/2018  . Goals of care, counseling/discussion 09/17/2018  . Abdominal pain 09/09/2018  . RUQ pain 07/08/2018  . Metastatic breast cancer ([place]) 07/02/2018  . Liver mass 06/27/2018  . GERD (gastroesophageal reflux disease) 04/02/2018  . Rectal bleeding 04/02/2018  . Constipation 04/02/2018  . Acquired absence of left breast 08/01/2017  . OAB (overactive bladder) 07/31/2017  . Vitamin D deficiency 02/26/2017  . Prediabetes 02/26/2017  . HLD (hyperlipidemia) 02/26/2017  . Asthma 02/12/2017  . Depression 02/12/2017  . Migraines 02/12/2017  . Arthralgia 02/12/2017  . Sleep-disordered breathing 02/12/2017  . Positive ANA (antinuclear antibody) 02/12/2017  . Angular cheilitis 02/12/2017  . Class 2  obesity due to excess calories without serious comorbidity with body mass index (BMI) of 35.0 to 35.9 in adult 02/12/2017  . Malignant neoplasm of central portion of left breast in female, estrogen receptor positive ([place]) 01/05/2017    Orientation RESPIRATION BLADDER Height & Weight     Self, Time, Situation  Normal Continent Weight: 94 kg Height:  5\' 5"  (165.1 cm)(Simultaneous filing. User may not have seen previous data.)  BEHAVIORAL SYMPTOMS/MOOD NEUROLOGICAL BOWEL NUTRITION STATUS      Continent Diet  AMBULATORY STATUS COMMUNICATION OF NEEDS Skin   Extensive Assist Verbally Normal                       Personal Care Assistance Level of Assistance  Bathing, Feeding, Dressing Bathing Assistance: Limited assistance Feeding assistance: Independent Dressing Assistance: Limited assistance     Functional Limitations Info  Sight, Hearing, Speech Sight Info: Adequate Hearing Info: Adequate Speech Info: Adequate    SPECIAL CARE FACTORS FREQUENCY  PT (By licensed PT)     PT Frequency: 5x/week              Contractures Contractures Info: Not present    Additional Factors Info  Code Status, Allergies, Psychotropic Code Status Info: FULL Allergies Info: CORN CONTAINING PRODUCTS; SALAGEN; TAPE; AMOXICILLIN; CAFFEINE; TETANUS Psychotropic Info: LEXAPRO         Current Medications (12/15/2018):  This is the current hospital active medication list Current Facility-Administered Medications  Medication Dose Route Frequency Provider Last Rate Last Dose  . camphor-menthol (SARNA) lotion   Topical PRN Tat, Shanon Brow, MD      . diphenhydrAMINE (BENADRYL) capsule 25  mg  25 mg Oral QHS PRN Tat, David, MD      . docusate sodium (COLACE) capsule 100 mg  100 mg Oral BID PRN Jani Gravel, MD      . escitalopram (LEXAPRO) tablet 20 mg  20 mg Oral Daily Jani Gravel, MD   20 mg at 12/15/18 1017  . feeding supplement (PRO-STAT SUGAR FREE 64) liquid 30 mL  30 mL Oral BID Jani Gravel, MD   30 mL  at 12/15/18 1023  . lactated ringers infusion   Intravenous Continuous Tat, Shanon Brow, MD 100 mL/hr at 12/15/18 0630    . levofloxacin (LEVAQUIN) tablet 500 mg  500 mg Oral Q24H Tat, David, MD      . magic mouthwash  5 mL Oral QID Basilio Cairo, NP   5 mL at 12/15/18 1234   And  . lidocaine (XYLOCAINE) 2 % viscous mouth solution 15 mL  15 mL Mouth/Throat QID Basilio Cairo, NP   15 mL at 12/15/18 1234  . loratadine (CLARITIN) tablet 10 mg  10 mg Oral Daily Tat, David, MD   10 mg at 12/15/18 1026  . magnesium oxide (MAG-OX) tablet 400 mg  400 mg Oral Daily Jani Gravel, MD   400 mg at 12/15/18 1019  . nystatin (MYCOSTATIN) 100000 UNIT/ML suspension 500,000 Units  500,000 Units Oral QID Jani Gravel, MD   500,000 Units at 12/15/18 1334  . ondansetron (ZOFRAN) injection 4 mg  4 mg Intravenous Q6H PRN Jani Gravel, MD      . oxyCODONE-acetaminophen (PERCOCET/ROXICET) 5-325 MG per tablet 1 tablet  1 tablet Oral Q4H PRN Jani Gravel, MD   1 tablet at 12/15/18 0529  . pantoprazole (PROTONIX) EC tablet 40 mg  40 mg Oral Daily Jani Gravel, MD   40 mg at 12/15/18 1018  . polyethylene glycol (MIRALAX / GLYCOLAX) packet 17 g  17 g Oral Daily Tat, David, MD   17 g at 12/15/18 1243  . potassium chloride (K-DUR) CR tablet 20 mEq  20 mEq Oral Daily Jani Gravel, MD   20 mEq at 12/15/18 1018  . prochlorperazine (COMPAZINE) tablet 10 mg  10 mg Oral Q6H PRN Jani Gravel, MD      . senna [facility]) tablet 17.2 mg  2 tablet Oral Daily Tat, David, MD   17.2 mg at 12/15/18 1243  . temazepam (RESTORIL) capsule 7.5 mg  7.5 mg Oral QHS PRN Basilio Cairo, NP         Discharge Medications: Please see discharge summary for a list of discharge medications.  Relevant Imaging Results:  Relevant Lab Results:   Additional Information SSN [place] [id number]  Khian Remo, Chauncey Reading, RN

## 2018-12-15 NOTE — TOC Initial Note (Signed)
Transition of Care [facility]) - Initial/Assessment Note    Patient Details  Name: Pamela Long MRN: [id number] Date of Birth: Sep 08, 1970  Transition of Care [facility]) CM/SW Contact:    Clema Skousen, Chauncey Reading, RN Phone Number: 12/15/2018, 2:49 PM  Clinical Narrative:            48 year old female with a history of metastatic breast cancer to the liver, anxiety/depression, chemotherapy related neuropathy presenting with shortness of breath and nonproductive cough that began on 12/09/2018. Recommended for SNF. Agreeable, would like referrals to [place] facilities.        Expected Discharge Plan: Skilled Nursing Facility Barriers to Discharge: SNF Pending bed offer   Patient Goals and CMS Choice Patient states their goals for this hospitalization and ongoing recovery are:: get stronger and get home CMS Medicare.gov Compare Post Acute Care list provided to:: Patient Choice offered to / list presented to : Patient  Expected Discharge Plan and Services Expected Discharge Plan: [place]    Prior Living Arrangements/Services     Patient language and need for interpreter reviewed:: Yes Do you feel safe going back to the place where you live?: Yes            Criminal Activity/Legal Involvement Pertinent to Current Situation/Hospitalization: No - Comment as needed  Activities of Daily Living Home Assistive Devices/Equipment: None ADL Screening (condition at time of admission) Patient's cognitive ability adequate to safely complete daily activities?: Yes Is the patient deaf or have difficulty hearing?: Yes Does the patient have difficulty seeing, even when wearing glasses/contacts?: Yes Does the patient have difficulty concentrating, remembering, or making decisions?: Yes Patient able to express need for assistance with ADLs?: Yes Does the patient have difficulty dressing or bathing?: No Independently performs ADLs?: Yes (appropriate for developmental age) Does the  patient have difficulty walking or climbing stairs?: Yes Weakness of Legs: Both Weakness of Arms/Hands: None  Permission Sought/Granted Permission sought to share information with : Chartered certified accountant granted to share information with : Yes, Verbal Permission Granted     Permission granted to share info w AGENCY: [place]        Emotional Assessment       Orientation: : Oriented to Self, Oriented to Place, Oriented to  Time      Admission diagnosis:  Metastatic breast cancer ([place]) [C50.919] Community acquired pneumonia of left lung, unspecified part of lung [J18.9] Sepsis, due to unspecified organism, unspecified whether acute organ dysfunction present [facility]) [A41.9] Patient Active Problem List   Diagnosis Date Noted  . Palliative care by specialist   . Community acquired pneumonia of left lung   . Acute on chronic respiratory failure with hypoxia ([place]) 12/13/2018  . Sepsis due to undetermined organism ([place]) 12/13/2018  . Pleural effusion on left   . HCAP (healthcare-associated pneumonia) 12/12/2018  . Febrile neutropenia ([place]) 12/12/2018  . Hypokalemia 12/12/2018  . Hyponatremia 12/12/2018  . Severe protein-calorie malnutrition ([place]) 12/12/2018  . Sepsis ([place]) 12/12/2018  . Obstructive jaundice 09/21/2018  . Goals of care, counseling/discussion 09/17/2018  . Abdominal pain 09/09/2018  . RUQ pain 07/08/2018  . Metastatic breast cancer ([place]) 07/02/2018  . Liver mass 06/27/2018  . GERD (gastroesophageal reflux disease) 04/02/2018  . Rectal bleeding 04/02/2018  . Constipation 04/02/2018  . Acquired absence of left breast 08/01/2017  . OAB (overactive bladder) 07/31/2017  . Vitamin D deficiency 02/26/2017  . Prediabetes 02/26/2017  . HLD (hyperlipidemia) 02/26/2017  . Asthma 02/12/2017  . Depression 02/12/2017  . Migraines  02/12/2017  . Arthralgia 02/12/2017  . Sleep-disordered breathing 02/12/2017  . Positive ANA (antinuclear antibody)  02/12/2017  . Angular cheilitis 02/12/2017  . Class 2 obesity due to excess calories without serious comorbidity with body mass index (BMI) of 35.0 to 35.9 in adult 02/12/2017  . Malignant neoplasm of central portion of left breast in female, estrogen receptor positive ([place]) 01/05/2017   PCP:  Doree Albee, MD Pharmacy:   [id number] - [place], [address] [place] [address] Phone: [phone] Fax: [place], [address] [place] [address] Phone: [phone] Fax: [place], [address] [address] Phone: [phone] Fax: [phone]     Social Determinants of Health (SDOH) Interventions    Readmission Risk Interventions Readmission Risk Prevention Plan 12/15/2018  Transportation Screening Complete  Medication Review Press photographer) Complete  HRI or [place] Not Complete  HRI or Home Care Consult Pt Refusal Comments SNF referral  SW Recovery Care/Counseling Consult Complete  Palliative Care Screening Complete  Skilled Nursing Facility Complete

## 2018-12-15 NOTE — Evaluation (Signed)
Physical Therapy Evaluation Patient Details Name: Pamela Long MRN: [id number] DOB: [birth year] Today's Date: 12/15/2018   History of Present Illness  Pamela Long  is a 48 y.o. female,w metastatic breast cancer, anemia, Gerd, Anxiety/ Depression, apparently present with not feeling well for the past 2 days. + cough with white sputum. Pt has had subjective fever " feeling hot" for the past 2 days.  Pt has slight dyspnea, pt denies cp, palp, n/v, abd pain, diarrhea, brbpr, black stool, dysuria, hematuria.  Pt was supposed to have more chemo this past week but did not due to low Wbc.    Clinical Impression  Patient agreeable for therapy.  Patient at risk for falls secondary to BLE weakness requiring hand held assist or use of RW to safely complete sit to stands and transfers to chair and commode.  Patient unsteady with frequent leaning on nearby objects for support, had patient [place] RW for gait training and demonstrates slow labored cadence with frequent standing rest breaks due to fatigue.  Patient tolerated sitting up in chair after transferring to commode to urinate - RN/NT notified that patient left in chair.  Patient will benefit from continued physical therapy in hospital and recommended venue below to increase strength, balance, endurance for safe ADLs and gait.    Follow Up Recommendations SNF    Equipment Recommendations  Rolling walker with 5" wheels;3in1 (PT)    Recommendations for Other Services       Precautions / Restrictions Precautions Precautions: Fall Restrictions Weight Bearing Restrictions: No      Mobility  Bed Mobility Overal bed mobility: Needs Assistance Bed Mobility: Supine to Sit     Supine to sit: Supervision     General bed mobility comments: increased time, head of bed raised  Transfers Overall transfer level: Needs assistance Equipment used: 1 person hand held assist;Rolling walker (2 wheeled) Transfers: Sit to/from [employer] Sit to Stand: Min assist Stand pivot transfers: Min assist       General transfer comment: required hand held assist due to fair/poor standing balance, safer using RW  Ambulation/Gait Ambulation/Gait assistance: Min assist Gait Distance (Feet): 40 Feet Assistive device: Rolling walker (2 wheeled) Gait Pattern/deviations: Decreased step length - left;Decreased stance time - right;Decreased stride length;Wide base of support Gait velocity: decreased   General Gait Details: very unsteady with wide base of support with tendency to lean on nearby objects for support, required use RW for safety, demonstrates slow labored cadence with frequent standing rest breaks, limited secondary to c/o fatigue  Stairs            Wheelchair Mobility    Modified Rankin (Stroke Patients Only)       Balance Overall balance assessment: Needs assistance Sitting-balance support: Feet supported;No upper extremity supported Sitting balance-Leahy Scale: Good     Standing balance support: During functional activity;No upper extremity supported Standing balance-Leahy Scale: Poor Standing balance comment: fair using RW                             Pertinent Vitals/Pain Pain Assessment: 0-10 Pain Score: 6  Pain Location: 5-6/10 right knee and throat Pain Descriptors / Indicators: Aching;Sore Pain Intervention(s): Limited activity within patient's tolerance;Monitored during session    [place] expects to be discharged to:: Private residence Living Arrangements: Children Available Help at Discharge: Family;Available PRN/intermittently Type of Home: Mobile home Home Access: Stairs to enter Entrance Stairs-Rails: Right Entrance Stairs-Number of Steps:  4 Home Layout: One level Home Equipment: None      Prior Function Level of Independence: Needs assistance   Gait / Transfers Assistance Needed: household ambulator having to frequently lean on nearby  objects for support  ADL's / Homemaking Assistance Needed: assisted by daughter        Hand Dominance        Extremity/Trunk Assessment   Upper Extremity Assessment Upper Extremity Assessment: Generalized weakness    Lower Extremity Assessment Lower Extremity Assessment: Generalized weakness    Cervical / Trunk Assessment Cervical / Trunk Assessment: Normal  Communication   Communication: No difficulties  Cognition Arousal/Alertness: Awake/alert Behavior During Therapy: WFL for tasks assessed/performed Overall Cognitive Status: Within Functional Limits for tasks assessed                                        General Comments      Exercises     Assessment/Plan    PT Assessment Patient needs continued PT services  PT Problem List Decreased strength;Decreased activity tolerance;Decreased balance;Decreased mobility       PT Treatment Interventions Balance training;Gait training;Stair training;Functional mobility training;Therapeutic activities;Therapeutic exercise;Patient/family education    PT Goals (Current goals can be found in the Care Plan section)  Acute Rehab PT Goals Patient Stated Goal: get stronger in rehab PT Goal Formulation: With patient Time For Goal Achievement: 12/29/18 Potential to Achieve Goals: Good    Frequency Min 3X/week   Barriers to discharge        Co-evaluation               AM-PAC PT "6 Clicks" Mobility  Outcome Measure Help needed turning from your back to your side while in a flat bed without using bedrails?: A Little Help needed moving from lying on your back to sitting on the side of a flat bed without using bedrails?: A Little Help needed moving to and from a bed to a chair (including a wheelchair)?: A Little Help needed standing up from a chair using your arms (e.g., wheelchair or bedside chair)?: A Little Help needed to walk in hospital room?: A Lot Help needed climbing 3-5 steps with a railing? :  A Lot 6 Click Score: 16    End of Session   Activity Tolerance: Patient tolerated treatment well;Patient limited by fatigue Patient left: in chair;with call bell/phone within reach Nurse Communication: Mobility status PT Visit Diagnosis: Unsteadiness on feet (R26.81);Other abnormalities of gait and mobility (R26.89);Muscle weakness (generalized) (M62.81)    Time: 2751-7001 PT Time Calculation (min) (ACUTE ONLY): 33 min   Charges:   PT Evaluation $PT Eval Moderate Complexity: 1 Mod PT Treatments $Therapeutic Activity: 23-37 mins        2:09 PM, 12/15/18 Lonell Grandchild, MPT Physical Therapist with [facility] 336 [phone] office [phone] mobile phone

## 2018-12-15 NOTE — Plan of Care (Signed)
  Problem: Acute Rehab PT Goals(only PT should resolve) Goal: Pt Will Go Supine/Side To Sit Outcome: Progressing Flowsheets (Taken 12/15/2018 1412) Pt will go Supine/Side to Sit: with modified independence Goal: Patient Will Transfer Sit To/From Stand Outcome: Progressing Flowsheets (Taken 12/15/2018 1412) Patient will transfer sit to/from stand:  with supervision  with min guard assist Goal: Pt Will Transfer Bed To Chair/Chair To Bed Outcome: Progressing Flowsheets (Taken 12/15/2018 1412) Pt will Transfer Bed to Chair/Chair to Bed:  with supervision  min guard assist Goal: Pt Will Ambulate Outcome: Progressing Flowsheets (Taken 12/15/2018 1412) Pt will Ambulate:  75 feet  with min guard assist  with rolling walker   2:13 PM, 12/15/18 Lonell Grandchild, MPT Physical Therapist with [facility] 336 [phone] office [phone] mobile phone

## 2018-12-16 DIAGNOSIS — Z9889 Other specified postprocedural states: Secondary | ICD-10-CM

## 2018-12-16 LAB — CBC WITH DIFFERENTIAL/PLATELET
Abs Immature Granulocytes: 1.31 10*3/uL — ABNORMAL HIGH (ref 0.00–0.07)
Basophils Absolute: 0.1 10*3/uL (ref 0.0–0.1)
Basophils Relative: 1 %
Eosinophils Absolute: 0 10*3/uL (ref 0.0–0.5)
Eosinophils Relative: 0 %
HCT: 23.9 % — ABNORMAL LOW (ref 36.0–46.0)
Hemoglobin: 7.8 g/dL — ABNORMAL LOW (ref 12.0–15.0)
Immature Granulocytes: 13 %
Lymphocytes Relative: 13 %
Lymphs Abs: 1.3 10*3/uL (ref 0.7–4.0)
MCH: 27 pg (ref 26.0–34.0)
MCHC: 32.6 g/dL (ref 30.0–36.0)
MCV: 82.7 fL (ref 80.0–100.0)
Monocytes Absolute: 1.3 10*3/uL — ABNORMAL HIGH (ref 0.1–1.0)
Monocytes Relative: 13 %
Neutro Abs: 6.3 10*3/uL (ref 1.7–7.7)
Neutrophils Relative %: 60 %
Platelets: 102 10*3/uL — ABNORMAL LOW (ref 150–400)
RBC: 2.89 MIL/uL — ABNORMAL LOW (ref 3.87–5.11)
RDW: 16.6 % — ABNORMAL HIGH (ref 11.5–15.5)
WBC: 10.3 10*3/uL (ref 4.0–10.5)
nRBC: 0.3 % — ABNORMAL HIGH (ref 0.0–0.2)

## 2018-12-16 LAB — COMPREHENSIVE METABOLIC PANEL
ALT: 22 U/L (ref 0–44)
AST: 45 U/L — ABNORMAL HIGH (ref 15–41)
Albumin: 1.9 g/dL — ABNORMAL LOW (ref 3.5–5.0)
Alkaline Phosphatase: 160 U/L — ABNORMAL HIGH (ref 38–126)
Anion gap: 3 — ABNORMAL LOW (ref 5–15)
BUN: 12 mg/dL (ref 6–20)
CO2: 27 mmol/L (ref 22–32)
Calcium: 7.8 mg/dL — ABNORMAL LOW (ref 8.9–10.3)
Chloride: 104 mmol/L (ref 98–111)
Creatinine, Ser: 0.49 mg/dL (ref 0.44–1.00)
GFR calc Af Amer: >60 mL/min (ref >60)
GFR calc non Af Amer: >60 mL/min (ref >60)
Glucose, Bld: 92 mg/dL (ref 70–99)
Potassium: 2.9 mmol/L — ABNORMAL LOW (ref 3.5–5.1)
Sodium: 134 mmol/L — ABNORMAL LOW (ref 135–145)
Total Bilirubin: 1 mg/dL (ref 0.3–1.2)
Total Protein: 4.7 g/dL — ABNORMAL LOW (ref 6.5–8.1)

## 2018-12-16 LAB — URINE CULTURE: Culture: NO GROWTH

## 2018-12-16 LAB — MAGNESIUM: Magnesium: 1.9 mg/dL (ref 1.7–2.4)

## 2018-12-16 MED ORDER — LEVOFLOXACIN 500 MG PO TABS: 500.0000 mg | ORAL_TABLET | Freq: Every day | ORAL | 0 refills | Status: AC

## 2018-12-16 MED ORDER — ACETAMINOPHEN 325 MG PO TABS: 650.0000 mg | ORAL_TABLET | Freq: Four times a day (QID) | ORAL | 2 refills | Status: DC | PRN

## 2018-12-16 MED ORDER — POTASSIUM CHLORIDE CRYS ER 20 MEQ PO TBCR: 40.0000 meq | EXTENDED_RELEASE_TABLET | Freq: Every day | ORAL | 0 refills | Status: DC

## 2018-12-16 MED ORDER — OXYCODONE-ACETAMINOPHEN 5-325 MG PO TABS: 1.0000 | ORAL_TABLET | ORAL | 0 refills | Status: DC | PRN

## 2018-12-16 MED ORDER — SENNOSIDES-DOCUSATE SODIUM 8.6-50 MG PO TABS: 2.0000 | ORAL_TABLET | Freq: Every evening | ORAL | 1 refills | Status: DC | PRN

## 2018-12-16 MED ORDER — POTASSIUM CHLORIDE CRYS ER 20 MEQ PO TBCR
40.0000 meq | EXTENDED_RELEASE_TABLET | ORAL | Status: DC
  Administered 2018-12-16: 10:00:00 40 meq via ORAL
  Filled 2018-12-16: qty 2

## 2018-12-16 NOTE — Progress Notes (Signed)
SATURATION QUALIFICATIONS: (This note is used to comply with regulatory documentation for home oxygen)  Patient Saturations on Room Air at Rest = 96%  Patient Saturations on Room Air while Ambulating = 95%  Patient Saturations on 0 Liters of oxygen while Ambulating = 95%  Please briefly explain why patient needs home oxygen: patient ambulated well without oxygen therapy.

## 2018-12-16 NOTE — Discharge Instructions (Signed)
1)Your Potassium was increased to 40 meq daily 2) follow-up with Dr. [name] at the cancer center on Thursday, 12/24/2018 for recheck and blood work including CBC and BMP tests 3) the rest of the medications as prescribed 4) the chaplain/spiritual care services can help you complete your advance directives paperwork through the cancer center will next you at the cancer center on Thursday 12/24/2018--- Please ask for them to call the chaplain 5) outpatient palliative care services consult may be set up through the cancer center as well

## 2018-12-16 NOTE — Progress Notes (Signed)
Daily Progress Note   Patient Name: Pamela Long       Date: 12/16/2018 DOB: [birth year]  Age: 48 y.o. MRN#: [id number] Attending Physician: Roxan Hockey, MD Primary Care Physician: Doree Albee, MD Admit Date: 12/12/2018  Reason for Consultation/Follow-up: Establishing goals of care  Subjective: Patient awake, alert, oriented. Sitting up in bed taking morning medications for nurse.   Chaplain has not been to bedside. I explained that if AD packet is not completed this admission, possibly could be arranged at a cancer center visit. Reassured patient that I would discuss with chaplain when she is available.   Patient plans to work with physical therapy today. Reports mouth sores are much better and she is tolerating foods.   Answered questions. Emotional support provided.   Length of Stay: 4  Current Medications: Scheduled Meds:  . escitalopram  20 mg Oral Daily  . feeding supplement (PRO-STAT SUGAR FREE 64)  30 mL Oral BID  . levofloxacin  500 mg Oral Q24H  . magic mouthwash  5 mL Oral QID   And  . lidocaine  15 mL Mouth/Throat QID  . loratadine  10 mg Oral Daily  . magnesium oxide  400 mg Oral Daily  . nystatin  500,000 Units Oral QID  . pantoprazole  40 mg Oral Daily  . polyethylene glycol  17 g Oral Daily  . potassium chloride  40 mEq Oral Q3H  . senna  2 tablet Oral Daily    Continuous Infusions: . lactated ringers 100 mL/hr at 12/15/18 0630    PRN Meds: acetaminophen, camphor-menthol, diphenhydrAMINE, docusate sodium, ondansetron (ZOFRAN) IV, oxyCODONE-acetaminophen, prochlorperazine, temazepam  Physical Exam Vitals signs and nursing note reviewed.  Constitutional:      General: She is awake.  HENT:     Head: Normocephalic and atraumatic.   Cardiovascular:     Rate and Rhythm: Normal rate.  Pulmonary:     Effort: No tachypnea, accessory muscle usage or respiratory distress.     Comments: Room Air Skin:    General: Skin is warm and dry.     Coloration: Skin is pale.  Neurological:     Mental Status: She is alert and oriented to person, place, and time.  Psychiatric:        Mood and Affect: Mood normal.  Speech: Speech normal.        Behavior: Behavior normal.        Cognition and Memory: Cognition normal.             Vital Signs: BP 112/76 (BP Location: Right Arm)   Pulse 96   Temp 98.2 F (36.8 C) (Axillary)   Resp 16   Ht 5\' 5"  (1.651 m) Comment: Simultaneous filing. User may not have seen previous data.  Wt 94 kg   SpO2 94%   BMI 34.49 kg/m  SpO2: SpO2: 94 % O2 Device: O2 Device: Room Air O2 Flow Rate: O2 Flow Rate (L/min): 2 L/min  Intake/output summary:   Intake/Output Summary (Last 24 hours) at 12/16/2018 1029 Last data filed at 12/16/2018 0900 Gross per 24 hour  Intake 969.43 ml  Output 1250 ml  Net -280.57 ml   LBM: Last BM Date: 12/10/18 Baseline Weight: Weight: 74.4 kg Most recent weight: Weight: 94 kg       Palliative Assessment/Data: PPS 50%      Patient Active Problem List   Diagnosis Date Noted  . Palliative care by specialist   . Community acquired pneumonia of left lung   . Acute on chronic respiratory failure with hypoxia ([place]) 12/13/2018  . Sepsis due to undetermined organism ([place]) 12/13/2018  . Pleural effusion on left   . HCAP (healthcare-associated pneumonia) 12/12/2018  . Febrile neutropenia ([place]) 12/12/2018  . Hypokalemia 12/12/2018  . Hyponatremia 12/12/2018  . Severe protein-calorie malnutrition ([place]) 12/12/2018  . Sepsis ([place]) 12/12/2018  . Obstructive jaundice 09/21/2018  . Goals of care, counseling/discussion 09/17/2018  . Abdominal pain 09/09/2018  . RUQ pain 07/08/2018  . Metastatic breast cancer ([place]) 07/02/2018  . Liver mass 06/27/2018  . GERD  (gastroesophageal reflux disease) 04/02/2018  . Rectal bleeding 04/02/2018  . Constipation 04/02/2018  . Acquired absence of left breast 08/01/2017  . OAB (overactive bladder) 07/31/2017  . Vitamin D deficiency 02/26/2017  . Prediabetes 02/26/2017  . HLD (hyperlipidemia) 02/26/2017  . Asthma 02/12/2017  . Depression 02/12/2017  . Migraines 02/12/2017  . Arthralgia 02/12/2017  . Sleep-disordered breathing 02/12/2017  . Positive ANA (antinuclear antibody) 02/12/2017  . Angular cheilitis 02/12/2017  . Class 2 obesity due to excess calories without serious comorbidity with body mass index (BMI) of 35.0 to 35.9 in adult 02/12/2017  . Malignant neoplasm of central portion of left breast in female, estrogen receptor positive ([place]) 01/05/2017    Palliative Care Assessment & Plan   Patient Profile: 48 y.o. 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 Followed by [facility]/Dr. [name], last seen 12/10/18 when chemotherapy was held due to leukopenia. Hospital admission for neutropenic fever, sepsis with lactic acid 4.3 on admit, acute respiratory failure with hypoxia requiring 3L [place], bilateral pleural effusions. Pending thoracentesis and venous duplex, scheduled for 7/6 afternoon. Palliative medicine consultation for goals of care.   Assessment: Metastatic breast cancer Neutropenic fever Sepsis Acute respiratory failure with hypoxia Bilateral pleural effusions Bilateral leg edema--negative DVT Mucositis Anxiety Depression Hyponatremia Hypokalemia  Recommendations/Plan:  GOC completed 12/15/18. Patient wishes for ongoing FULL code/FULL scope treatment. She wishes to 'get better and get stronger.' She is eager to work with physical therapy today and is willing to discharge to SNF for rehab if absolutely necessary.    Encouraged ongoing discussions with oncologist regarding expectations with diagnoses, disease progression, options, and  prognosis.   Patient interested in completing AD/HCPOA packet. Spiritual consult placed.   Patient interested in outpatient palliative referral. Unfortunately, limited in [facility] but will ask [facility] CM/SW look into options.   Continue current symptom management medications.    Code Status: FULL   Code Status Orders  (From admission, onward)         Start     Ordered   12/12/18 2019  Full code  Continuous     12/12/18 2022        Code Status History    Date Active Date Inactive Code Status Order ID Comments User Context   09/10/2018 0039 09/10/2018 1639 Full Code [id number]  Oswald Hillock, MD Inpatient   Advance Care Planning Activity       Prognosis:   Unable to determine  Discharge Planning:  To Be Determined home with home health vs SNF for rehab  Care plan was discussed with patient, RN, Dr Denton Brick  Thank you for allowing the Palliative Medicine Team to assist in the care of this patient.   Time In: 1015 Time Out: 1030 Total Time 15 Prolonged Time Billed  no      Greater than 50%  of this time was spent counseling and coordinating care related to the above assessment and plan.  Ihor Dow, FNP-C Palliative Medicine Team  Phone: [phone] Fax: [phone]  Please contact Palliative Medicine Team phone at [phone] for questions and concerns.

## 2018-12-16 NOTE — Discharge Summary (Signed)
Pamela Long, is a 48 y.o. female  DOB 1971/04/06  MRN [id number].  Admission date:  12/12/2018  Admitting Physician  Jani Gravel, MD  Discharge Date:  12/16/2018   Primary MD  Doree Albee, MD  Recommendations for primary care physician for things to follow:   1)Your Potassium was increased to 40 meq daily 2) follow-up with Dr. [name] at the cancer center on Thursday, 12/24/2018 for recheck and blood work including CBC and BMP tests 3) the rest of the medications as prescribed 4) the chaplain/spiritual care services can help you complete your advance directives paperwork through the cancer center will next you at the cancer center on Thursday 12/24/2018--- Please ask for them to call the chaplain 5) outpatient palliative care services consult may be set up through the cancer center as well   Admission Diagnosis  Metastatic breast cancer ([place]) [place] acquired pneumonia of left lung, unspecified part of lung [J18.9] Sepsis, due to unspecified organism, unspecified whether acute organ dysfunction present [facility]) [A41.9]   Discharge Diagnosis  Metastatic breast cancer ([place]) [place] acquired pneumonia of left lung, unspecified part of lung [J18.9] Sepsis, due to unspecified organism, unspecified whether acute organ dysfunction present ([place]) [A41.9]    Principal Problem:   HCAP (healthcare-associated pneumonia) Active Problems:   Asthma   Depression   Metastatic breast cancer ([place])   Febrile neutropenia ([place])   Hypokalemia   Hyponatremia   Severe protein-calorie malnutrition ([place])   Sepsis ([place])   Acute on chronic respiratory failure with hypoxia ([place])   Sepsis due to undetermined organism ([place])   Pleural effusion on left   Palliative care by specialist   Community acquired pneumonia of left lung   Status post thoracentesis      Past Medical History:  Diagnosis  Date  . Anemia   . Anxiety   . Asthma   . Breast cancer ([place])    Left, 2017  . Depression   . GERD (gastroesophageal reflux disease)   . Migraine   . OAB (overactive bladder)   . Seasonal allergies     Past Surgical History:  Procedure Laterality Date  . ABDOMINAL HYSTERECTOMY     breast cancer  . ANKLE RECONSTRUCTION Right 1989  . BILIARY STENT PLACEMENT N/A 09/22/2018   Procedure: BILIARY STENT PLACEMENT;  Surgeon: Rogene Houston, MD;  [facility];  Service: Endoscopy;  Laterality: N/A;  . BREAST SURGERY    . COLONOSCOPY WITH PROPOFOL N/A 08/06/2018   Procedure: COLONOSCOPY WITH PROPOFOL;  Surgeon: Danie Binder, MD;  Location: [facility];  Service: Endoscopy;  Laterality: N/A;  1:30pm  . ERCP N/A 09/22/2018   Procedure: ENDOSCOPIC RETROGRADE CHOLANGIOPANCREATOGRAPHY (ERCP);  Surgeon: Rogene Houston, MD;  Location: [facility];  Service: Endoscopy;  Laterality: N/A;  . FOREIGN BODY REMOVAL N/A 08/29/2017   from lip  . KNEE SURGERY Right 1989   bone spur  . MASTECTOMY  2017   bil mastectomies  . SINUSOTOMY  HPI  from the history and physical done on the day of admission:   Pamela Long  is a 48 y.o. female,w metastatic breast cancer, anemia, Gerd, Anxiety/ Depression, apparently present with not feeling well for the past 2 days. + cough with white sputum. Pt has had subjective fever " feeling hot" for the past 2 days.  Pt has slight dyspnea, pt denies cp, palp, n/v, abd pain, diarrhea, brbpr, black stool, dysuria, hematuria.  Pt was supposed to have more chemo this past week but did not due to low Wbc.   In ED,  T 103.0  P 127  R 20  Bp 112/34  Pox 99% on o2  Wt 74.4 kg  CXR IMPRESSION: Patchy consolidation of left lung base pneumonia is not excluded. Left pleural effusion is noted.   Na 133, K 3.4 Bun 11, Creatinine 0.93 Ast 40, Alt 22, Alk phos 135, T. Bili 2.4 Alb 2.4 Wbc 1.2, hgb 9.7, Plt 69 INR 1.7 Pro calcitonin 1.69  Lactic acid 4.3-> 3.6  covid 19 negative   Pt tx with levaquin iv in ED,   Pt will be admitted for sepsis (fever, tachycardia, leukopenia, elevated lactic acid), secondary to likely Hcap,  Also febrile neutropenia.       Hospital Course:   Brief History: 48 year old female with a history of metastatic breast cancer to the liver, anxiety/depression, chemotherapy related neuropathy presenting with shortness of breath and nonproductive cough that began on 12/09/2018. The patient had subjective fevers and chills. She visited the [facility] on 12/10/2018 for her regularly scheduled chemotherapy. It was held because of her leukopenia. The patient was given Neupogen 480 mcg on 12/10/2018. The patient denied any nausea, vomiting, diarrhea, dysuria, hematuria, neck pain, headache. She denies any hemoptysis. She denies any recent travels or sick contacts.Upon presentation, the patient was noted to have a temperature of 103.0 F with soft blood pressures. Oxygen saturation is 100% on 3 L. She was started on vancomycin and levofloxacin. With an elevated d-dimer, CT angiogram of the chest was obtained and was negative for pulmonary embolus, but showed bilateral pleural effusions, left greater than right with bibasilar atelectasis. Her lactic acid was elevated to 4.3. Initial WBC was 1.2 with platelets 69,000.  Assessment/Plan: Neutropenic fever -Initially treated withvanc + cefepime, switched over to Levaquin, last dose 12/18/2018 -blood cultures, urine culture and pleural fluid cultures neg -Neutropenia resolved, WBC 10.3 with 60% neutrophils -Source unclear presently --No further fevers today  Sepsis -Present on admission -Source unclear--undifferentiated -Presented with leukopenia, elevated lactic acid, and fever of 103.0 F -Treated with antibiotics as above, complete Levaquin as above --Procalcitonin2.98>>>1.99>>0.94  Acute respiratory failure with hypoxia -Secondary to  pleural effusions -Resolved hypoxia -thoracocentesis on 12/14/2018--only able to remove 120 cc -CTA chest as described above - chest x-ray--bilateral pleural effusions --- Oh 2/96% at rest on room air 95% post ambulation on RA  Bilateral pleural effusions -Lights Criteria suggests transudate -suspect due to hypoalbuminemia -thoracocentesis 12/14/2018--only able to remove 120 cc -Echocardiogram--EF 60-65%, impaired relaxation, trivial pericardial effusion -Urine protein creatinine ratio--0.22 -BNP--97  Metastatic breast cancer -Initially diagnosed January 2017 -s/p bilateral mastectomy -1/17/2020liver biopsy--ER/PR positive, HER-2/neu negative breast cancer -Discussed with Dr. 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 Laural Golden -  Depression/Anxiety -continue lexapro  Hypokalemia/Hypomagnesemia/hyponatremia -replaced  Disposition Plan:Home with home health PT   Family Communication:Pt is alert and coherent  Consultants:Discussed with Dr. Tera Helper  Code Status: FULL   Procedures: As Listed in Progress Note Above  Antibiotics: vanc7/4 levoflox7/4 Cefepime 7/5>>>7/7 levoflox 7/7   Discharge Condition: stable  Follow UP  Follow-up Information    Home, [facility] Follow up.   Specialty: Home Health Services Why: home health PT Contact information: [address] [place] [address] [place], [place] Follow up.   Why: will contact you about enrolling in their supportive care program as we discussed Contact information: [address] [place] [address] [phone]        [place] Follow up.   Why: will ship you a RW and shower stool (cost  of shower stool is $45)            Diet and Activity recommendation:  As advised  Discharge Instructions    Discharge Instructions    Call MD for:  persistant dizziness or light-headedness   Complete by: As directed    Call MD for:  persistant nausea and vomiting   Complete by: As directed    Call MD for:  severe uncontrolled pain   Complete by: As directed    Call MD for:  temperature >100.4   Complete by: As directed    Diet - low sodium heart healthy   Complete by: As directed    Discharge instructions   Complete by: As directed    1)Your Potassium was increased to 40 meq daily 2) follow-up with Dr. [name] at the cancer center on Thursday, 12/24/2018 for recheck and blood work including CBC and BMP tests 3) the rest of the medications as prescribed 4) the chaplain/spiritual care services can help you complete your advance directives paperwork through the cancer center will next you at the cancer center on Thursday 12/24/2018--- Please ask for them to call the chaplain 5) outpatient palliative care services consult may be set up through the cancer center as well   Increase activity slowly   Complete by: As directed         Discharge Medications     Allergies as of 12/16/2018      Reactions   Corn-containing Products Other (See Comments)   Headache and GI upset   Salagen [pilocarpine] Other (See Comments)   Can cause liver failure    Tape Itching, Other (See Comments)   Depending on the adhesive-blistering occurs   Amoxicillin Hives, Other (See Comments)   Rash only DID THE REACTION INVOLVE: Swelling of the face/tongue/throat, SOB, or low BP? Sudden or severe rash/hives, skin peeling, or the inside of the mouth or nose?  Did it require medical treatment?  When did it last happen? If all above answers are "NO", may proceed with cephalosporin use.   Caffeine Diarrhea, Nausea Only, Palpitations, Other (See Comments)   Headache   Tetanus Toxoids Swelling,  Other (See Comments)   Local reaction      Medication List    STOP taking these medications   docusate sodium 100 MG capsule Commonly known as: Colace   potassium chloride 10 MEQ tablet Commonly known as: K-DUR     TAKE these medications   acetaminophen 325 MG tablet Commonly known as: TYLENOL Take 2 tablets (650 mg total) by mouth every 6 (six) hours as needed for mild pain, fever or headache (T over 101).   b complex vitamins  capsule Take 1 capsule by mouth daily.   escitalopram 20 MG tablet Commonly known as: LEXAPRO Take 1 tablet (20 mg total) by mouth daily.   levofloxacin 500 MG tablet Commonly known as: LEVAQUIN Take 1 tablet (500 mg total) by mouth daily for 2 days. Start taking on: December 17, 2018   Lysine 1000 MG Tabs Take 1,000 mg by mouth daily.   magic mouthwash w/lidocaine Soln Take 5 mLs by mouth 4 (four) times daily as needed for mouth pain.   magnesium oxide 400 MG tablet Commonly known as: MAG-OX TAKE 1 TABLET BY MOUTH EVERY DAY   nystatin 100000 UNIT/ML suspension Commonly known as: MYCOSTATIN Take 5 mLs (500,000 Units total) by mouth 4 (four) times daily. Swish and gargle - then either spit or swallow   ondansetron 8 MG disintegrating tablet Commonly known as: ZOFRAN-ODT TAKE 1 TABLET (8 MG TOTAL) BY MOUTH EVERY 8 (EIGHT) HOURS AS NEEDED FOR NAUSEA OR VOMITING.   oxyCODONE-acetaminophen 5-325 MG tablet Commonly known as: Percocet Take 1 tablet by mouth every 4 (four) hours as needed for moderate pain. What changed: Another medication with the same name was removed. Continue taking this medication, and follow the directions you see here.   pantoprazole 40 MG tablet Commonly known as: PROTONIX TAKE 1 TABLET BY MOUTH EVERY DAY BEFORE BREAKFAST What changed: See the new instructions.   potassium chloride SA 20 MEQ tablet Commonly known as: K-DUR Take 2 tablets (40 mEq total) by mouth daily.   prochlorperazine 10 MG tablet Commonly known as:  COMPAZINE Take 1 tablet (10 mg total) by mouth every 6 (six) hours as needed for nausea or vomiting.   promethazine 25 MG tablet Commonly known as: PHENERGAN Take 1 tablet (25 mg total) by mouth every 6 (six) hours as needed for nausea or vomiting.   Refresh 1.4-0.6 % Soln Generic drug: Polyvinyl Alcohol-Povidone PF Place 1 drop into both eyes as needed (for dry eyes).   rizatriptan 10 MG disintegrating tablet Commonly known as: MAXALT-MLT Take 10 mg by mouth every 2 (two) hours as needed for migraine.   senna-docusate 8.6-50 MG tablet Commonly known as: Senokot-S Take 2 tablets by mouth at bedtime as needed for mild constipation or moderate constipation.   TAXOL IV Inject into the vein once a week.   temazepam 15 MG capsule Commonly known as: RESTORIL Take 1 capsule (15 mg total) by mouth at bedtime as needed for sleep.   Vitamin D3 Gummies 25 MCG (1000 UT) Chew Generic drug: Cholecalciferol Chew 2,000-4,000 Units by mouth daily.   zolpidem 10 MG tablet Commonly known as: AMBIEN Take 1 tablet (10 mg total) by mouth at bedtime as needed for up to 30 days for sleep.            Durable Medical Equipment  (From admission, onward)         Start     Ordered   12/16/18 1309  For home use only DME Walker  Once    Question:  Patient needs a walker to treat with the following condition  Answer:  Weakness generalized   12/16/18 1309   12/16/18 1309  For home use only DME Shower stool  Once     12/16/18 1309   12/16/18 1219  For home use only DME Shower stool  Once     12/16/18 1218   12/16/18 1219  For home use only DME Walker rolling  Once    Question:  Patient needs a walker to treat  with the following condition  Answer:  Weakness   12/16/18 1218          Major procedures and Radiology Reports - PLEASE review detailed and final reports for all details, in brief -   Ct Angio Chest Pe W Or Wo Contrast  Result Date: 12/13/2018 CLINICAL DATA:  Positive D-dimer.  Tachycardia. Metastatic breast cancer. Shortness of breath. EXAM: CT ANGIOGRAPHY CHEST WITH CONTRAST TECHNIQUE: Multidetector CT imaging of the chest was performed using the standard protocol during bolus administration of intravenous contrast. Multiplanar CT image reconstructions and MIPs were obtained to evaluate the vascular anatomy. CONTRAST:  141m OMNIPAQUE IOHEXOL 350 MG/ML SOLN COMPARISON:  Chest radiography 12/12/2018.  Chest CT 09/07/2018 FINDINGS: Cardiovascular: Pulmonary arterial opacification is excellent. There are no pulmonary emboli. Heart size is normal. Small amount of pericardial fluid. No aortic pathology is seen. Mediastinum/Nodes: No pathologic hilar or mediastinal lymph nodes. Lungs/Pleura: Moderate effusion layering dependently on the right. Moderate to large effusion layering dependently on the left. Dependent pulmonary atelectasis in association with those effusions. Aerated lung does not show any evidence of metastatic disease. Upper Abdomen: Development of a large amount of ascites. Advanced upper abdominal metastatic disease as seen previously. Musculoskeletal: No fracture or lytic destructive lesion. Review of the MIP images confirms the above findings. IMPRESSION: Negative for pulmonary emboli. Development of bilateral pleural effusions, larger on the left than the right with dependent pulmonary atelectasis. Development of ascites, in this patient with known hepatic metastatic disease. Electronically Signed   By: MNelson ChimesM.D.   On: 12/13/2018 07:04   UKoreaVenous Img Lower Bilateral  Result Date: 12/14/2018 CLINICAL DATA:  47year old female with a history right thigh pain EXAM: BILATERAL LOWER EXTREMITY VENOUS DOPPLER ULTRASOUND TECHNIQUE: Gray-scale sonography with graded compression, as well as color Doppler and duplex ultrasound were performed to evaluate the lower extremity deep venous systems from the level of the common femoral vein and including the common femoral,  femoral, profunda femoral, popliteal and calf veins including the posterior tibial, peroneal and gastrocnemius veins when visible. The superficial great saphenous vein was also interrogated. Spectral Doppler was utilized to evaluate flow at rest and with distal augmentation maneuvers in the common femoral, femoral and popliteal veins. COMPARISON:  None. FINDINGS: RIGHT LOWER EXTREMITY Common Femoral Vein: No evidence of thrombus. Normal compressibility, respiratory phasicity and response to augmentation. Saphenofemoral Junction: No evidence of thrombus. Normal compressibility and flow on color Doppler imaging. Profunda Femoral Vein: No evidence of thrombus. Normal compressibility and flow on color Doppler imaging. Femoral Vein: No evidence of thrombus. Normal compressibility, respiratory phasicity and response to augmentation. Popliteal Vein: No evidence of thrombus. Normal compressibility, respiratory phasicity and response to augmentation. Calf Veins: No evidence of thrombus. Normal compressibility and flow on color Doppler imaging. Superficial Great Saphenous Vein: No evidence of thrombus. Normal compressibility and flow on color Doppler imaging. Other Findings:  Edema LEFT LOWER EXTREMITY Common Femoral Vein: No evidence of thrombus. Normal compressibility, respiratory phasicity and response to augmentation. Saphenofemoral Junction: No evidence of thrombus. Normal compressibility and flow on color Doppler imaging. Profunda Femoral Vein: No evidence of thrombus. Normal compressibility and flow on color Doppler imaging. Femoral Vein: No evidence of thrombus. Normal compressibility, respiratory phasicity and response to augmentation. Popliteal Vein: No evidence of thrombus. Normal compressibility, respiratory phasicity and response to augmentation. Calf Veins: No evidence of thrombus. Normal compressibility and flow on color Doppler imaging. Superficial Great Saphenous Vein: No evidence of thrombus. Normal  compressibility and flow on color  Doppler imaging. Other Findings:  Edema IMPRESSION: Sonographic survey of the bilateral lower extremities negative for DVT. Edema Electronically Signed   By: Corrie Mckusick D.O.   On: 12/14/2018 14:07   Dg Chest Port 1 View  Result Date: 12/14/2018 CLINICAL DATA:  LEFT pleural effusion post thoracentesis EXAM: PORTABLE CHEST 1 VIEW COMPARISON:  Expiratory portable chest radiograph 1354 hours compared to 12/12/2018 FINDINGS: RIGHT arm PICC line with tip projecting over SVC. Enlargement of cardiac silhouette with vascular congestion. Bibasilar atelectasis and small pleural effusions. No pneumothorax following thoracentesis. IMPRESSION: No pneumothorax following thoracentesis. Persistent bibasilar atelectasis and small pleural effusions. Electronically Signed   By: Lavonia Dana M.D.   On: 12/14/2018 14:11   Dg Chest Portable 1 View  Result Date: 12/12/2018 CLINICAL DATA:  Shortness of breath EXAM: PORTABLE CHEST 1 VIEW COMPARISON:  September 18, 2018 FINDINGS: The heart size and mediastinal contours are stable. Right central venous line is unchanged. There is patchy opacity of left lung base with left pleural effusion. The right lung is clear. The visualized skeletal structures are unremarkable. IMPRESSION: Patchy consolidation of left lung base pneumonia is not excluded. Left pleural effusion is noted. Electronically Signed   By: Abelardo Diesel M.D.   On: 12/12/2018 16:54   US Thoracentesis Asp Pleural Space W/img Guide  Result Date: 12/14/2018 INDICATION: BILATERAL pleural effusions, metastatic breast cancer EXAM: ULTRASOUND GUIDED DIAGNOSTIC LEFT THORACENTESIS MEDICATIONS: None. COMPLICATIONS: None immediate. PROCEDURE: Procedure, benefits, and risks of procedure were discussed with patient. Written informed consent for procedure was obtained. Time out protocol followed. Pleural effusion localized by ultrasound at the posterior LEFT hemithorax. Skin prepped and draped in usual  sterile fashion. Skin and soft tissues anesthetized with 10 mL of 1% lidocaine. 8 French thoracentesis catheter placed into the LEFT pleural space. 120 mL of yellow LEFT pleural fluid aspirated by syringe pump. Procedure tolerated well by patient without immediate complication. Fluid appeared mildly complicated by preprocedural imaging. FINDINGS: A total of approximately 120 mL of LEFT pleural fluid was removed. Samples were sent to the laboratory as requested by the clinical team. IMPRESSION: Successful ultrasound guided LEFT thoracentesis yielding 120 mL of pleural fluid. Electronically Signed   By: Lavonia Dana M.D.   On: 12/14/2018 14:14    Micro Results   Recent Results (from the past 240 hour(s))  Culture, blood (Routine x 2)     Status: None (Preliminary result)   Collection Time: 12/12/18  3:50 PM   Specimen: Site Not Specified; Blood  Result Value Ref Range Status   Specimen Description   Final    SITE NOT SPECIFIED BOTTLES DRAWN AEROBIC AND ANAEROBIC   Special Requests Blood Culture adequate volume  Final   Culture   Final    NO GROWTH 4 DAYS Performed at [facility], [address] [place], [place] [address]    Report Status PENDING  Incomplete  SARS Coronavirus 2 (CEPHEID- Performed in [place] hospital lab), Hosp Order     Status: None   Collection Time: 12/12/18  6:00 PM   Specimen: Nasopharyngeal Swab  Result Value Ref Range Status   SARS Coronavirus 2 NEGATIVE NEGATIVE Final    Comment: (NOTE) If result is NEGATIVE SARS-CoV-2 target nucleic acids are NOT DETECTED. The SARS-CoV-2 RNA is generally detectable in upper and lower  respiratory specimens during the acute phase of infection. The lowest  concentration of SARS-CoV-2 viral copies this assay can detect is 250  copies / mL. A negative result does not preclude SARS-CoV-2  infection  and should not be used as the sole basis for treatment or other  patient management decisions.  A negative result may occur with   improper specimen collection / handling, submission of specimen other  than nasopharyngeal swab, presence of viral mutation(s) within the  areas targeted by this assay, and inadequate number of viral copies  (<250 copies / mL). A negative result must be combined with clinical  observations, patient history, and epidemiological information. If result is POSITIVE SARS-CoV-2 target nucleic acids are DETECTED. The SARS-CoV-2 RNA is generally detectable in upper and lower  respiratory specimens dur ing the acute phase of infection.  Positive  results are indicative of active infection with SARS-CoV-2.  Clinical  correlation with patient history and other diagnostic information is  necessary to determine patient infection status.  Positive results do  not rule out bacterial infection or co-infection with other viruses. If result is PRESUMPTIVE POSTIVE SARS-CoV-2 nucleic acids MAY BE PRESENT.   A presumptive positive result was obtained on the submitted specimen  and confirmed on repeat testing.  While 2019 novel coronavirus  (SARS-CoV-2) nucleic acids may be present in the submitted sample  additional confirmatory testing may be necessary for epidemiological  and / or clinical management purposes  to differentiate between  SARS-CoV-2 and other Sarbecovirus currently known to infect humans.  If clinically indicated additional testing with an alternate test  methodology [phone]) is advised. The SARS-CoV-2 RNA is generally  detectable in upper and lower respiratory sp ecimens during the acute  phase of infection. The expected result is Negative. Fact Sheet for Patients:  StrictlyIdeas.no Fact Sheet for Healthcare Providers: BankingDealers.co.za This test is not yet approved or cleared by the [place] FDA and has been authorized for detection and/or diagnosis of SARS-CoV-2 by FDA under an Emergency Use Authorization (EUA).  This EUA will  remain in effect (meaning this test can be used) for the duration of the COVID-19 declaration under Section 564(b)(1) of the Act, 21 U.S.C. section 360bbb-3(b)(1), unless the authorization is terminated or revoked sooner. Performed at [facility], [address], [place], [place] [address]   MRSA PCR Screening     Status: None   Collection Time: 12/13/18  3:18 AM   Specimen: Nasopharyngeal  Result Value Ref Range Status   MRSA by PCR NEGATIVE NEGATIVE Final    Comment:        The GeneXpert MRSA Assay (FDA approved for NASAL specimens only), is one component of a comprehensive MRSA colonization surveillance program. It is not intended to diagnose MRSA infection nor to guide or monitor treatment for MRSA infections. Performed at [facility], [address] [place], [place] [address]   Culture, body fluid-bottle     Status: None (Preliminary result)   Collection Time: 12/14/18  1:00 PM   Specimen: Pleura  Result Value Ref Range Status   Specimen Description PLEURAL  Final   Special Requests 10CC  Final   Culture   Final    NO GROWTH 2 DAYS Performed at [facility] [facility], [address], [place], [place] [address]    Report Status PENDING  Incomplete  Gram stain     Status: None   Collection Time: 12/14/18  1:00 PM   Specimen: Pleura  Result Value Ref Range Status   Specimen Description PLEURAL  Final   Special Requests NONE  Final   Gram Stain   Final    FEW WBC PRESENT, PREDOMINANTLY MONONUCLEAR NO ORGANISMS SEEN CYTOSPIN SMEAR Performed at [facility], [address] [place], [place] [address]    Report Status 12/14/2018 FINAL  Final  Culture, Urine     Status: None   Collection Time: 12/15/18  1:58 AM   Specimen: Urine, Clean Catch  Result Value Ref Range Status   Specimen Description   Final    URINE, CLEAN CATCH Performed at [facility], [address], [place], [place] [address]    Special Requests   Final    NONE Performed at [facility], [address], [place], [place] [address]    Culture   Final    NO GROWTH Performed at [facility], [place] [address], [place], [place] [address]    Report Status 12/16/2018 FINAL  Final       Today   Subjective    Marirose Deveney today has no new complaints, eating or drinking well, ambulated with staff without hypoxia post ambulation --- No chest pains, no further fevers,.. No dizziness or palpitation --- Patient reports poor endurance and easily fatigued          Patient has been seen and examined prior to discharge   Objective   Blood pressure 112/76, pulse 96, temperature 98.2 F (36.8 C), temperature source Axillary, resp. rate 16, height _0  (1.651 m), weight 94 kg, SpO2 94 %.   Intake/Output Summary (Last 24 hours) at 12/16/2018 1309 Last data filed at 12/16/2018 0900 Gross per 24 hour  Intake 969.43 ml  Output 950 ml  Net 19.43 ml    Exam Gen:- Awake Alert, no acute distress, speaking in complete sentences HEENT:- .AT, No sclera icterus Neck-Supple Neck,No JVD,.  Lungs-diminished in bases, no wheezing  CV- S1, S2 normal, regular Abd-  +ve B.Sounds, Abd Soft, No tenderness,    Extremity/Skin:- 1+  edema, negative Homans,   good pulses Psych-affect is appropriate, oriented x3 Neuro-generalized weakness, no new focal deficits, no tremors    Data Review   CBC w Diff:  Lab Results  Component Value Date   WBC 10.3 12/16/2018   HGB 7.8 (L) 12/16/2018   HCT 23.9 (L) 12/16/2018   PLT 102 (L) 12/16/2018   LYMPHOPCT 13 12/16/2018   MONOPCT 13 12/16/2018   EOSPCT 0 12/16/2018   BASOPCT 1 12/16/2018    CMP:  Lab Results  Component Value Date   NA 134 (L) 12/16/2018   K 2.9 (L) 12/16/2018   CL 104 12/16/2018   CO2 27 12/16/2018   BUN 12 12/16/2018   CREATININE 0.49 12/16/2018   CREATININE 0.82 04/01/2017   PROT 4.7 (L) 12/16/2018   ALBUMIN 1.9 (L) 12/16/2018   BILITOT 1.0 12/16/2018   ALKPHOS 160 (H) 12/16/2018   AST 45 (H) 12/16/2018   ALT 22 12/16/2018   .   Total Discharge time is about 33 minutes  Roxan Hockey M.D on 12/16/2018 at 1:09 PM  Go to [email or web address] -  for contact info  [facility] - Office  [phone]

## 2018-12-16 NOTE — TOC Transition Note (Signed)
DiTransition of Care [facility]) - CM/SW Discharge Note   Patient Details  Name: Pamela Long MRN: [id number] Date of Birth: March 20, 1971  Transition of Care [facility]) CM/SW Contact:  Tawanda Schall, Chauncey Reading, RN Phone Number: 12/16/2018, 11:52 AM   Clinical Narrative:   Patient has no bed offers. Discussed at rounds with attending, patient electing to go home at this time. Discussed Home health PT with patient , she is agreeable, no preference on providers. She would like a RW and shower seat, no preference on providers. Patient has Astronomer.  I have contacted [facility] to find out if they contract with any agencies to provide palliative services as requested.   ADDENDUM: [facility] can provide supportive care which includes RN making monthly calls, access to SW and chaplain. Patient is interested, will send referral.    [facility] patient for PT. Start of care will be Saturday, patient updated.     Barriers to Discharge: SNF Pending bed offer   Patient Goals and CMS Choice Patient states their goals for this hospitalization and ongoing recovery are:: get stronger and get home CMS Medicare.gov Compare Post Acute Care list provided to:: Patient Choice offered to / list presented to : Patient                 Social Determinants of Health (SDOH) Interventions     Readmission Risk Interventions Readmission Risk Prevention Plan 12/16/2018 12/15/2018  Transportation Screening - Complete  PCP or Specialist Appt within 3-5 Days Complete -  HRI or Home Care Consult Complete -  Social Work Consult for [place] Planning/Counseling Complete -  Palliative Care Screening Complete -  Medication Review Press photographer) Complete Complete  HRI or Home Care Consult - Not Complete  HRI or Home Care Consult Pt Refusal Comments - SNF referral  SW Recovery Care/Counseling Consult - Complete  Palliative Care Screening - Complete   Skilled Nursing Facility - Complete

## 2018-12-16 NOTE — Care Management (Addendum)
Patient Information  Patient Name  Pamela Long, Pamela Long ([id number]) Sex  Female DOB  Mar 09, 1971  Room Bed  A308 A308-01  Patient Demographics  Address  [address]  [place] [address] Phone  [phone] (Home)  [phone] (Mobile) E-mail Address  [email or web address]  Patient Ethnicity & Race  Ethnic Group Patient Race  Not Hispanic or Latino White or Caucasian  Emergency Contact(s)  Name Relation Home Work Mobile  Ragone,Adrianna Daughter [phone]  [phone]  Documents on File   Status Date Received Description  Documents for the Patient  [place] Not Received    [place] E-Signature HIPAA Notice of Privacy Signed 77/82/42   Driver's License Not Received  EXP 04/14/2025  Insurance Card Not Received    Advance Directives/Living Will/HCPOA/POA Not Received    Other Photo ID Not Received    Insurance Card     Insurance Card   02/12/17 aetna  AMB New Patient Records/Historical  03/07/17 02/13-05/15  AMB Outside Hospital Record Received 04/11/17 PROGRESS NOTE [facility] HEM ONC  Release of Information   Authorization/Release of information req/ROSM  Insurance Card Received 35/36/14 AETNA/Drivers Health Net Card Received 07/02/18 Aetna 2020  AMB Correspondence Received 12/03/17 H&P NOTE [facility]  Release of Information   RGA  AMB Referral Received 04/15/18 [facility]  AMB Correspondence Received 05/06/18   [place] E-Signature HIPAA Notice of Privacy Signed 06/17/18   AMB New Patient Records/Historical Received 06/19/18 06/18/18 [facility]  AMB New Patient Records/Historical Received 06/19/18 06/18/18 [facility]  Release of Information     Release of Information     Release of Information Accepted 07/02/18 Oley Balm Sahakian   [place] E-Signature HIPAA Notice of Privacy Spanish     Advanced Beneficiary Notice (ABN) Not Received     E-Signature AOB Spanish Not Received    AMB Correspondence (Deleted) 03/07/17 02/13-05/15 PROGRESS NOTE [facility]  Patient Photo   Photo of Patient  HIM Release of Information Output (Deleted) 04/03/18 Requested records  HIM Release of Information Output (Deleted) 07/13/18 Requested records  Patient Photo   Photo of Patient  Documents for the Encounter  AOB (Assignment of Insurance Benefits) Received 12/12/18 AOB 12/12/2018  E-signature AOB     MEDICARE RIGHTS Not Received    [employer] Rights     EMS Run Sheet Received 12/12/18   ED Patient Billing Extract   ED PB Billing Extract  HIM Release of Information Output   [id number]  Ultrasound Received 12/14/18   Cardiac Monitoring Strip Received 12/15/18   Cardiac Monitoring Strip Shift Summary Received 12/15/18   After Visit Summary   IP After Visit Summary  EKG Received 12/14/18   Admission Information  Current Information  Attending Provider Admitting Provider Admission Type Admission Status  Roxan Hockey, MD Jani Gravel, MD Emergency Admission (Confirmed)       Admission Date/Time Discharge Date Hospital Service Auth/Cert Status  43/15/40 03:33 PM  Internal Medicine [place] [facility] Room/Bed   [facility] AP-DEPT 300 A308/A308-01        Discharge Disposition Discharge Destination  06-Home-Health Care Svc   Admission  Complaint  Fever  Hospital Account  Name Acct ID Class Status Primary Coverage  Pamela Long, Pamela Long [id number] Inpatient Open Albina Billet NAP      Guarantor Account (for Hospital Account [id number])  Name Relation to Pt Service Area Active? Acct  Type  Erby Pian Self CHSA Yes Personal/Family  Address Phone    [place], [place] [address] [phone])        Coverage Information (for Hospital Account [id number])  F/O Payor/Plan Precert #  AETNA/AETNA NAP   Subscriber Subscriber #  Tram, Wrenn  [id number]  Address Phone  [place] [address]    SSN # [id number]

## 2018-12-17 ENCOUNTER — Ambulatory Visit (HOSPITAL_COMMUNITY): Payer: 59 | Admitting: Hematology

## 2018-12-17 ENCOUNTER — Ambulatory Visit (HOSPITAL_COMMUNITY): Payer: 59

## 2018-12-17 ENCOUNTER — Other Ambulatory Visit (HOSPITAL_COMMUNITY): Payer: 59

## 2018-12-17 LAB — CULTURE, BLOOD (ROUTINE X 2)
Culture: NO GROWTH
Special Requests: ADEQUATE

## 2018-12-17 LAB — LEGIONELLA PNEUMOPHILA SEROGP 1 UR AG: L. pneumophila Serogp 1 Ur Ag: NEGATIVE

## 2018-12-17 NOTE — Pre-Procedure Instructions (Signed)
Labs routed to Dr Arnoldo Morale.

## 2018-12-18 ENCOUNTER — Other Ambulatory Visit (HOSPITAL_COMMUNITY)
Admission: RE | Admit: 2018-12-18 | Discharge: 2018-12-18 | Disposition: A | Discharge status: Home / Self Care | Payer: 59 | Source: Ambulatory Visit | Attending: General Surgery | Admitting: General Surgery

## 2018-12-18 ENCOUNTER — Other Ambulatory Visit: Payer: Self-pay

## 2018-12-19 ENCOUNTER — Other Ambulatory Visit (HOSPITAL_COMMUNITY): Payer: Self-pay | Admitting: Nurse Practitioner

## 2018-12-19 DIAGNOSIS — C50919 Malignant neoplasm of unspecified site of unspecified female breast: Secondary | ICD-10-CM

## 2018-12-19 LAB — CULTURE, BODY FLUID W GRAM STAIN -BOTTLE: Culture: NO GROWTH

## 2018-12-21 ENCOUNTER — Encounter (HOSPITAL_COMMUNITY): Payer: Self-pay

## 2018-12-21 ENCOUNTER — Encounter (HOSPITAL_COMMUNITY)
Admission: RE | Admit: 2018-12-21 | Discharge: 2018-12-21 | Disposition: A | Discharge status: Home / Self Care | Payer: 59 | Source: Ambulatory Visit | Attending: General Surgery | Admitting: General Surgery

## 2018-12-21 ENCOUNTER — Other Ambulatory Visit (HOSPITAL_COMMUNITY)
Admission: RE | Admit: 2018-12-21 | Discharge: 2018-12-21 | Disposition: A | Discharge status: Home / Self Care | Payer: 59 | Source: Ambulatory Visit | Attending: General Surgery | Admitting: General Surgery

## 2018-12-21 ENCOUNTER — Other Ambulatory Visit: Payer: Self-pay

## 2018-12-21 DIAGNOSIS — C787 Secondary malignant neoplasm of liver and intrahepatic bile duct: Secondary | ICD-10-CM | POA: Diagnosis not present

## 2018-12-21 DIAGNOSIS — Z9013 Acquired absence of bilateral breasts and nipples: Secondary | ICD-10-CM | POA: Diagnosis not present

## 2018-12-21 DIAGNOSIS — R188 Other ascites: Secondary | ICD-10-CM | POA: Diagnosis not present

## 2018-12-21 DIAGNOSIS — K5900 Constipation, unspecified: Secondary | ICD-10-CM | POA: Diagnosis not present

## 2018-12-21 DIAGNOSIS — K219 Gastro-esophageal reflux disease without esophagitis: Secondary | ICD-10-CM | POA: Diagnosis not present

## 2018-12-21 DIAGNOSIS — M7989 Other specified soft tissue disorders: Secondary | ICD-10-CM | POA: Diagnosis not present

## 2018-12-21 DIAGNOSIS — F329 Major depressive disorder, single episode, unspecified: Secondary | ICD-10-CM | POA: Diagnosis not present

## 2018-12-21 DIAGNOSIS — F419 Anxiety disorder, unspecified: Secondary | ICD-10-CM | POA: Diagnosis not present

## 2018-12-21 DIAGNOSIS — Z79899 Other long term (current) drug therapy: Secondary | ICD-10-CM | POA: Diagnosis not present

## 2018-12-21 DIAGNOSIS — Z853 Personal history of malignant neoplasm of breast: Secondary | ICD-10-CM | POA: Diagnosis not present

## 2018-12-21 DIAGNOSIS — G43909 Migraine, unspecified, not intractable, without status migrainosus: Secondary | ICD-10-CM | POA: Diagnosis not present

## 2018-12-21 DIAGNOSIS — Z1159 Encounter for screening for other viral diseases: Secondary | ICD-10-CM | POA: Diagnosis not present

## 2018-12-21 DIAGNOSIS — E876 Hypokalemia: Secondary | ICD-10-CM | POA: Diagnosis not present

## 2018-12-21 HISTORY — DX: Other complications of anesthesia, initial encounter: T88.59XA

## 2018-12-22 LAB — SARS CORONAVIRUS 2 (TAT 6-24 HRS): SARS Coronavirus 2: NEGATIVE

## 2018-12-23 ENCOUNTER — Ambulatory Visit (HOSPITAL_COMMUNITY): Payer: 59

## 2018-12-23 ENCOUNTER — Encounter (HOSPITAL_COMMUNITY)
Admission: RE | Disposition: A | Discharge status: Home / Self Care | Payer: Self-pay | Source: Home / Self Care | Attending: General Surgery

## 2018-12-23 ENCOUNTER — Other Ambulatory Visit: Payer: Self-pay

## 2018-12-23 ENCOUNTER — Ambulatory Visit (HOSPITAL_COMMUNITY): Payer: 59 | Admitting: Anesthesiology

## 2018-12-23 ENCOUNTER — Ambulatory Visit (HOSPITAL_COMMUNITY)
Admission: RE | Admit: 2018-12-23 | Discharge: 2018-12-23 | Disposition: A | Discharge status: Home / Self Care | Payer: 59 | Attending: General Surgery | Admitting: General Surgery

## 2018-12-23 ENCOUNTER — Encounter (HOSPITAL_COMMUNITY): Payer: Self-pay | Admitting: Anesthesiology

## 2018-12-23 DIAGNOSIS — C787 Secondary malignant neoplasm of liver and intrahepatic bile duct: Secondary | ICD-10-CM | POA: Insufficient documentation

## 2018-12-23 DIAGNOSIS — Z1159 Encounter for screening for other viral diseases: Secondary | ICD-10-CM | POA: Insufficient documentation

## 2018-12-23 DIAGNOSIS — C50919 Malignant neoplasm of unspecified site of unspecified female breast: Secondary | ICD-10-CM

## 2018-12-23 DIAGNOSIS — Z9013 Acquired absence of bilateral breasts and nipples: Secondary | ICD-10-CM | POA: Insufficient documentation

## 2018-12-23 DIAGNOSIS — M7989 Other specified soft tissue disorders: Secondary | ICD-10-CM | POA: Insufficient documentation

## 2018-12-23 DIAGNOSIS — F419 Anxiety disorder, unspecified: Secondary | ICD-10-CM | POA: Insufficient documentation

## 2018-12-23 DIAGNOSIS — Z79899 Other long term (current) drug therapy: Secondary | ICD-10-CM | POA: Insufficient documentation

## 2018-12-23 DIAGNOSIS — K219 Gastro-esophageal reflux disease without esophagitis: Secondary | ICD-10-CM | POA: Insufficient documentation

## 2018-12-23 DIAGNOSIS — R188 Other ascites: Secondary | ICD-10-CM | POA: Insufficient documentation

## 2018-12-23 DIAGNOSIS — K5900 Constipation, unspecified: Secondary | ICD-10-CM | POA: Insufficient documentation

## 2018-12-23 DIAGNOSIS — Z95828 Presence of other vascular implants and grafts: Secondary | ICD-10-CM

## 2018-12-23 DIAGNOSIS — E876 Hypokalemia: Secondary | ICD-10-CM | POA: Insufficient documentation

## 2018-12-23 DIAGNOSIS — F329 Major depressive disorder, single episode, unspecified: Secondary | ICD-10-CM | POA: Insufficient documentation

## 2018-12-23 DIAGNOSIS — G43909 Migraine, unspecified, not intractable, without status migrainosus: Secondary | ICD-10-CM | POA: Insufficient documentation

## 2018-12-23 DIAGNOSIS — Z853 Personal history of malignant neoplasm of breast: Secondary | ICD-10-CM | POA: Insufficient documentation

## 2018-12-23 HISTORY — PX: PORTACATH PLACEMENT: SHX2246

## 2018-12-23 IMAGING — CR PORTABLE CHEST - 1 VIEW
1 series · 2 of 2 positions shown · non-contrast
Comparison: [DATE]

CLINICAL DATA: Status post left port placement

EXAM:
PORTABLE CHEST 1 VIEW

[Series 1: portable · 0.17mm/px · 2 of 2 slices shown]
[im 1/2]
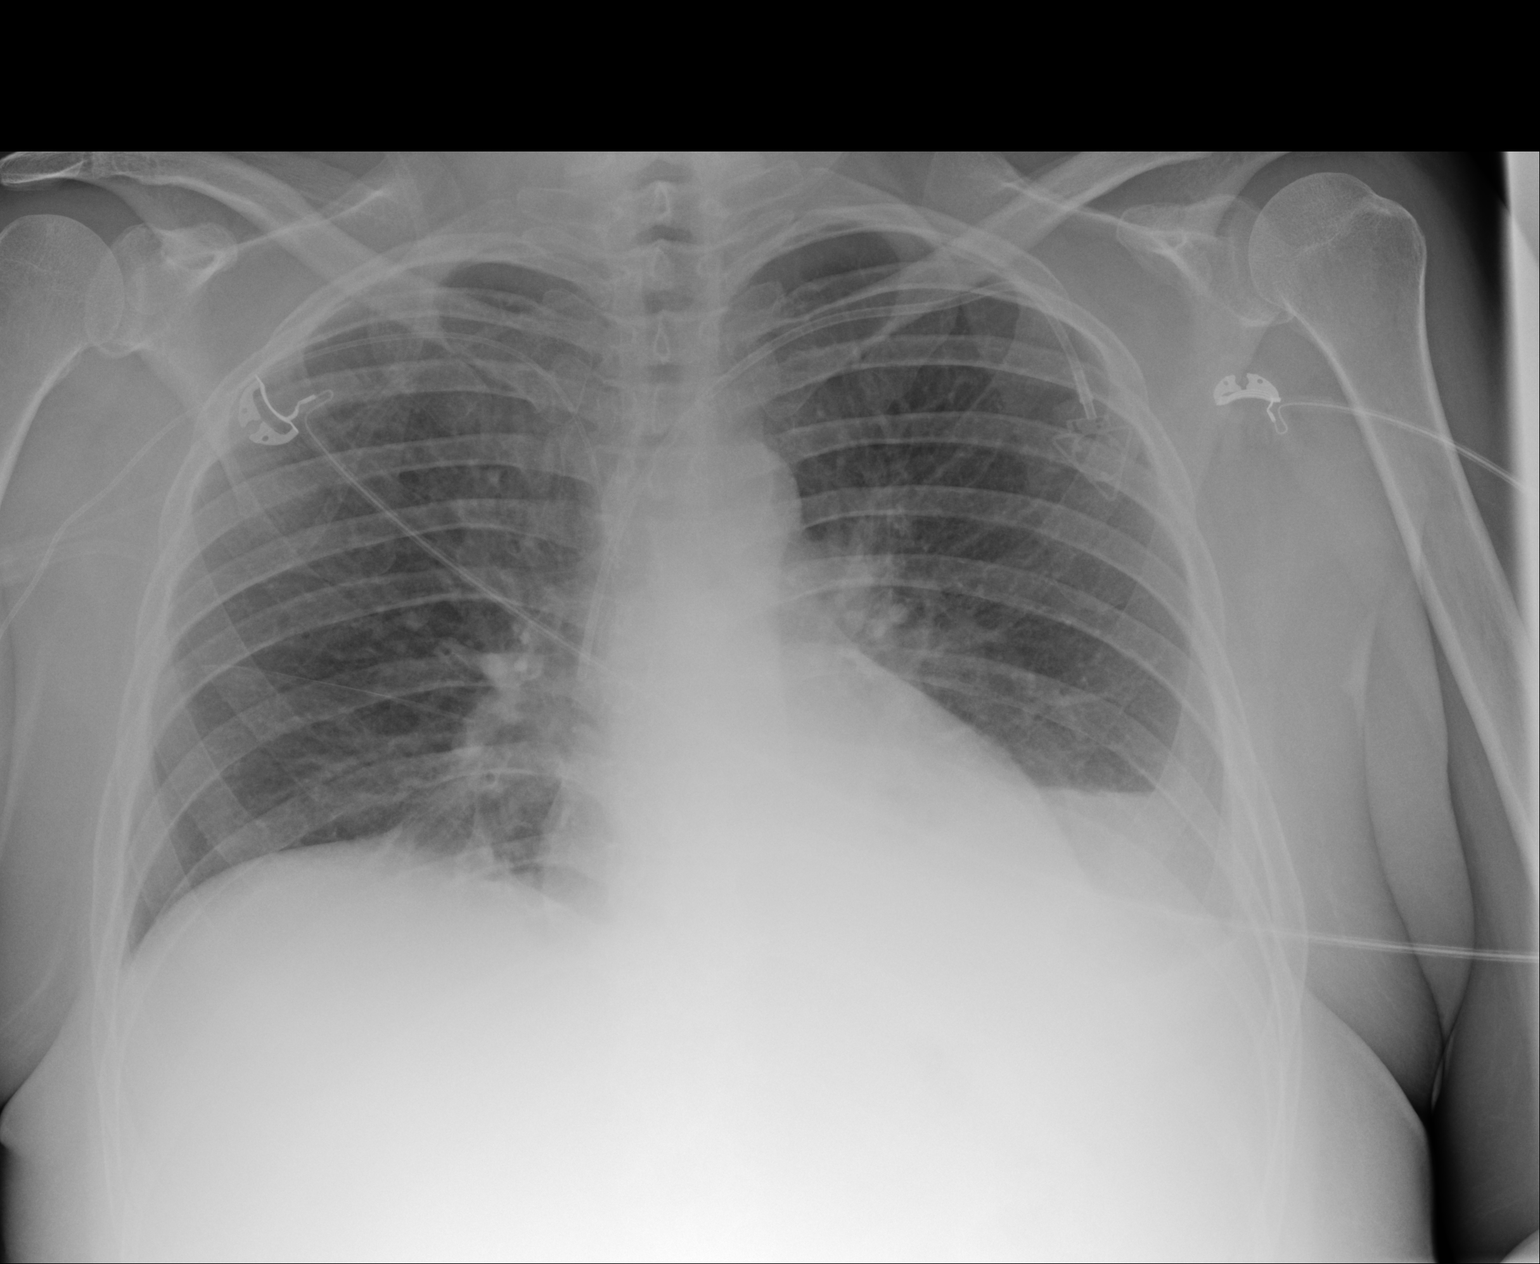
[im 2/2]
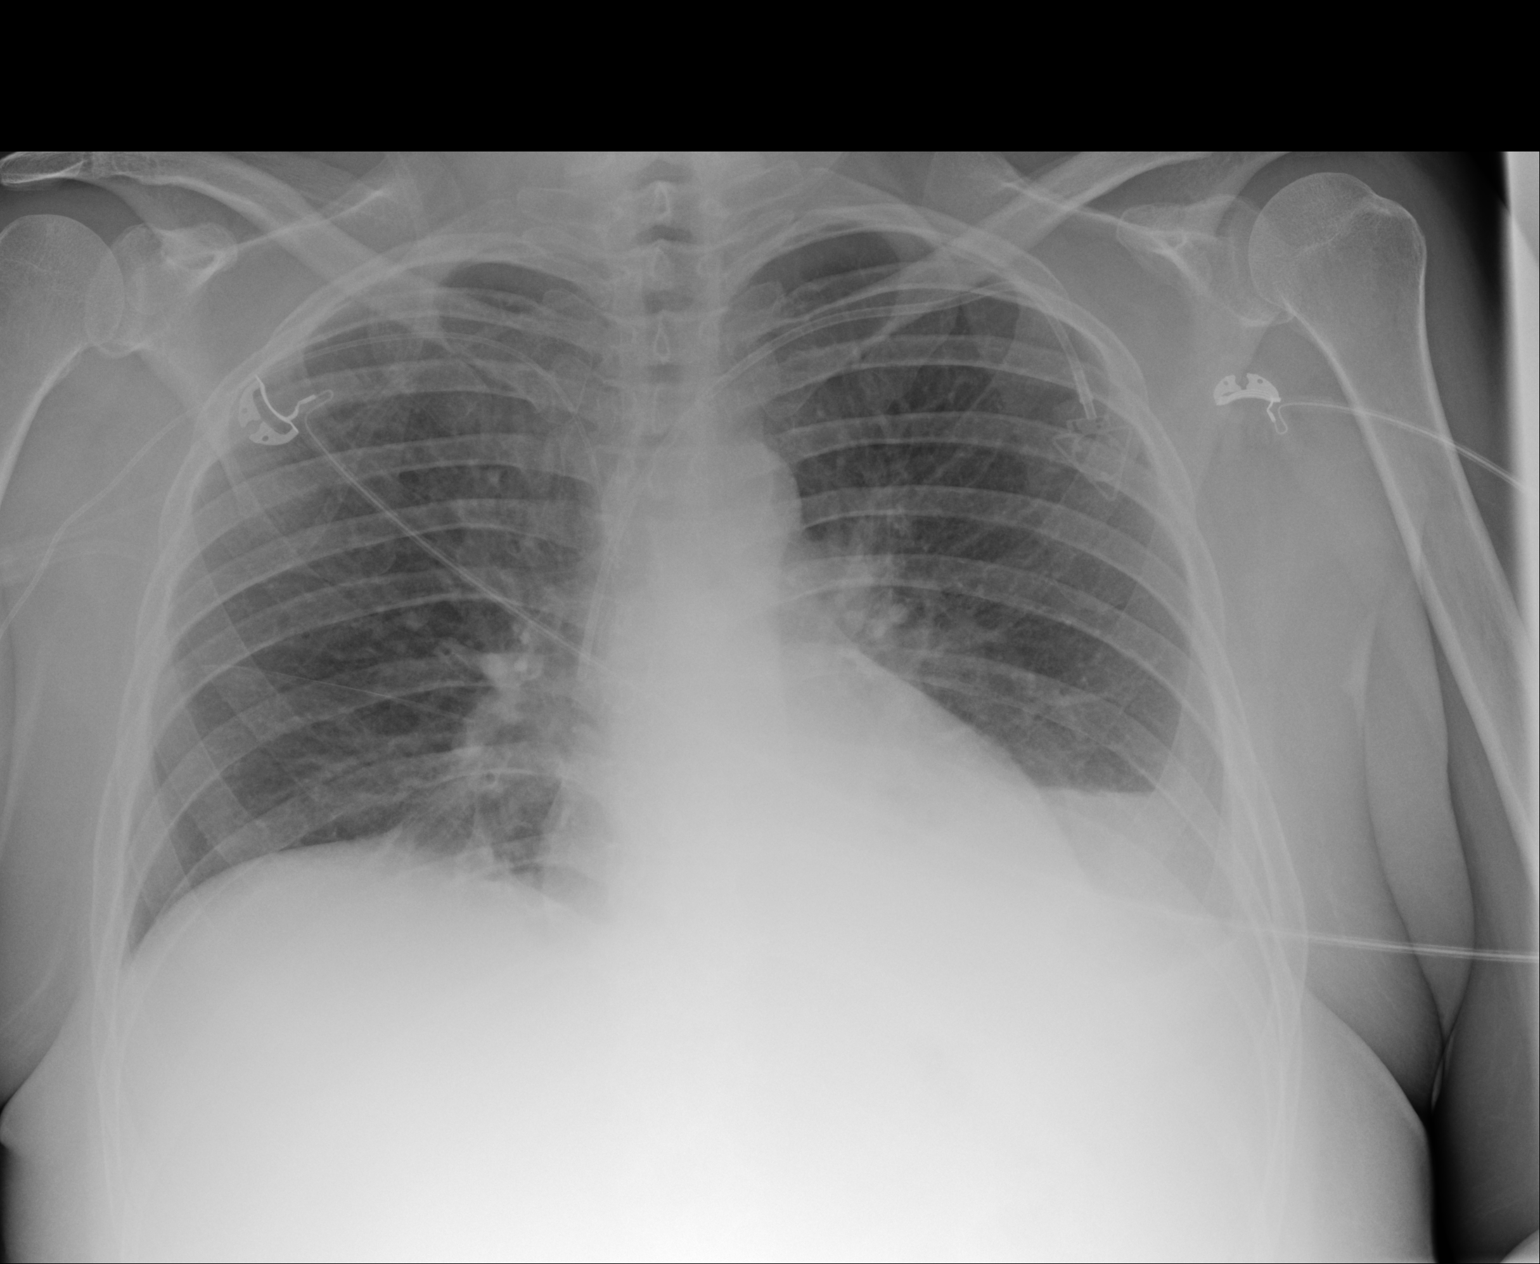

[2 of 2 positions shown; findings below may reference images not displayed]

FINDINGS: Right-sided PICC line is noted in the superior vena cava. New
left-sided chest wall prior port is seen with the catheter tip in
the distal superior vena cava. No pneumothorax is noted. Small left
pleural effusion is again seen.
IMPRESSION: No pneumothorax following port placement.

## 2018-12-23 SURGERY — INSERTION, TUNNELED CENTRAL VENOUS DEVICE, WITH PORT
Anesthesia: General | Laterality: Left

## 2018-12-23 MED ORDER — KETOROLAC TROMETHAMINE 30 MG/ML IJ SOLN
30.0000 mg | Freq: Once | INTRAMUSCULAR | Status: AC
  Administered 2018-12-23: 30 mg via INTRAVENOUS
  Filled 2018-12-23: qty 1

## 2018-12-23 MED ORDER — LACTATED RINGERS IV SOLN: INTRAVENOUS | Status: DC

## 2018-12-23 MED ORDER — KETAMINE HCL 10 MG/ML IJ SOLN
INTRAMUSCULAR | Status: DC | PRN
  Administered 2018-12-23: 10 mg via INTRAVENOUS

## 2018-12-23 MED ORDER — HEPARIN SOD (PORK) LOCK FLUSH 100 UNIT/ML IV SOLN
INTRAVENOUS | Status: DC | PRN
  Administered 2018-12-23: 500 [IU]

## 2018-12-23 MED ORDER — LIDOCAINE HCL (PF) 1 % IJ SOLN
INTRAMUSCULAR | Status: DC | PRN
  Administered 2018-12-23: 8 mL

## 2018-12-23 MED ORDER — LIDOCAINE HCL (PF) 1 % IJ SOLN
INTRAMUSCULAR | Status: AC
  Filled 2018-12-23: qty 30

## 2018-12-23 MED ORDER — LIDOCAINE HCL (CARDIAC) PF 100 MG/5ML IV SOSY
PREFILLED_SYRINGE | INTRAVENOUS | Status: DC | PRN
  Administered 2018-12-23: 40 mg via INTRAVENOUS

## 2018-12-23 MED ORDER — SODIUM CHLORIDE (PF) 0.9 % IJ SOLN
INTRAMUSCULAR | Status: DC | PRN
  Administered 2018-12-23: 3 mL

## 2018-12-23 MED ORDER — LACTATED RINGERS IV SOLN
INTRAVENOUS | Status: DC | PRN
  Administered 2018-12-23: 07:00:00 via INTRAVENOUS

## 2018-12-23 MED ORDER — FENTANYL CITRATE (PF) 100 MCG/2ML IJ SOLN: 25.0000 ug | INTRAMUSCULAR | Status: DC | PRN

## 2018-12-23 MED ORDER — CHLORHEXIDINE GLUCONATE CLOTH 2 % EX PADS: 6.0000 | MEDICATED_PAD | Freq: Once | CUTANEOUS | Status: DC

## 2018-12-23 MED ORDER — PHENYLEPHRINE HCL (PRESSORS) 10 MG/ML IV SOLN
INTRAVENOUS | Status: DC | PRN
  Administered 2018-12-23: 40 ug via INTRAVENOUS
  Administered 2018-12-23: 80 ug via INTRAVENOUS

## 2018-12-23 MED ORDER — PROPOFOL 10 MG/ML IV BOLUS
INTRAVENOUS | Status: AC
  Filled 2018-12-23: qty 40

## 2018-12-23 MED ORDER — KETAMINE HCL 50 MG/5ML IJ SOSY
PREFILLED_SYRINGE | INTRAMUSCULAR | Status: AC
  Filled 2018-12-23: qty 5

## 2018-12-23 MED ORDER — HEPARIN SOD (PORK) LOCK FLUSH 100 UNIT/ML IV SOLN
INTRAVENOUS | Status: AC
  Filled 2018-12-23: qty 5

## 2018-12-23 MED ORDER — PROPOFOL 500 MG/50ML IV EMUL
INTRAVENOUS | Status: DC | PRN
  Administered 2018-12-23: 150 ug/kg/min via INTRAVENOUS

## 2018-12-23 MED ORDER — VANCOMYCIN HCL IN DEXTROSE 1-5 GM/200ML-% IV SOLN
1000.0000 mg | INTRAVENOUS | Status: AC
  Administered 2018-12-23: 07:00:00 1000 mg via INTRAVENOUS
  Filled 2018-12-23: qty 200

## 2018-12-23 MED ORDER — ONDANSETRON HCL 4 MG/2ML IJ SOLN
INTRAMUSCULAR | Status: DC | PRN
  Administered 2018-12-23: 4 mg via INTRAVENOUS

## 2018-12-23 SURGICAL SUPPLY — 29 items
BAG DECANTER FOR FLEXI CONT (MISCELLANEOUS) ×2 IMPLANT
CHLORAPREP W/TINT 10.5 ML (MISCELLANEOUS) ×2 IMPLANT
CLOTH BEACON ORANGE TIMEOUT ST (SAFETY) ×2 IMPLANT
COVER LIGHT HANDLE STERIS (MISCELLANEOUS) ×4 IMPLANT
COVER WAND RF STERILE (DRAPES) ×2 IMPLANT
DECANTER SPIKE VIAL GLASS SM (MISCELLANEOUS) ×2 IMPLANT
DERMABOND ADVANCED (GAUZE/BANDAGES/DRESSINGS) ×1
DERMABOND ADVANCED .7 DNX12 (GAUZE/BANDAGES/DRESSINGS) ×1 IMPLANT
DRAPE C-ARM FOLDED MOBILE STRL (DRAPES) ×2 IMPLANT
ELECT REM PT RETURN 9FT ADLT (ELECTROSURGICAL) ×2
ELECTRODE REM PT RTRN 9FT ADLT (ELECTROSURGICAL) ×1 IMPLANT
GLOVE BIO SURGEON STRL SZ7 (GLOVE) ×2 IMPLANT
GLOVE BIOGEL PI IND STRL 7.0 (GLOVE) ×2 IMPLANT
GLOVE BIOGEL PI INDICATOR 7.0 (GLOVE) ×2
GLOVE SURG SS PI 7.5 STRL IVOR (GLOVE) ×2 IMPLANT
GOWN STRL REUS W/TWL LRG LVL3 (GOWN DISPOSABLE) ×4 IMPLANT
IV NS 500ML (IV SOLUTION) ×1
IV NS 500ML BAXH (IV SOLUTION) ×1 IMPLANT
KIT PORT POWER 8FR ISP MRI (Port) ×2 IMPLANT
KIT TURNOVER KIT A (KITS) ×2 IMPLANT
NEEDLE HYPO 25X1 1.5 SAFETY (NEEDLE) ×2 IMPLANT
PACK MINOR (CUSTOM PROCEDURE TRAY) ×2 IMPLANT
PAD ARMBOARD 7.5X6 YLW CONV (MISCELLANEOUS) ×2 IMPLANT
SET BASIN LINEN APH (SET/KITS/TRAYS/PACK) ×2 IMPLANT
SUT MNCRL AB 4-0 PS2 18 (SUTURE) ×2 IMPLANT
SUT VIC AB 3-0 SH 27 (SUTURE) ×1
SUT VIC AB 3-0 SH 27X BRD (SUTURE) ×1 IMPLANT
SYR 5ML LL (SYRINGE) ×2 IMPLANT
SYR CONTROL 10ML LL (SYRINGE) ×2 IMPLANT

## 2018-12-23 NOTE — Anesthesia Procedure Notes (Signed)
Procedure Name: General with mask airway Performed by: Andree Elk Amy A, CRNA Pre-anesthesia Checklist: Patient identified, Emergency Drugs available, Patient being monitored, Suction available and Timeout performed Patient Re-evaluated:Patient Re-evaluated prior to induction Oxygen Delivery Method: Simple face mask

## 2018-12-23 NOTE — Op Note (Signed)
Patient:  Pamela Long  DOB:  08/27/1970  MRN:  [id number]   Preop Diagnosis: Metastatic breast cancer  Postop Diagnosis: Same  Procedure: Port-A-Cath insertion  Surgeon: Aviva Signs, MD  Anes: MAC  Indications: Patient is a 48 year old white female with metastatic breast cancer who is undergoing chemotherapy and needs central venous access.  The risks and benefits of the procedure including bleeding, infection, and pneumothorax were fully explained to the patient, who gave informed consent.  Procedure note: The patient was placed in supine position.  After monitored anesthesia care was given, the left upper chest was prepped and draped using the usual sterile technique with ChloraPrep.  Surgical site confirmation was performed.  1% Xylocaine was used for local anesthesia.  An incision was made below the left clavicle.  A subcutaneous pocket was formed.  A needle was advanced into the left subclavian vein using the Seldinger technique without difficulty.  A guidewire was then advanced into the right atrium under fluoroscopic guidance.  An introducer and peel-away sheath were placed over the guidewire.  The catheter was inserted through the peel-away sheath and the peel-away sheath was removed.  The catheter was then attached to the port and the port placed in subcutaneous pocket.  Adequate positioning was confirmed by fluoroscopy.  Good backflow of venous blood was noted on aspiration of the port.  The port was flushed with heparin flush.  The subcutaneous layer was reapproximated using a 3-0 Vicryl interrupted suture.  The skin was closed using a 4-0 Monocryl subcuticular suture.  Dermabond was applied.  All tape and needle counts were correct at the end of the procedure.  The patient was transferred to PACU in stable condition.  A chest x-ray will be performed at that time.  Complications: None  EBL: Minimal  Specimen: None

## 2018-12-23 NOTE — Anesthesia Preprocedure Evaluation (Signed)
Anesthesia Evaluation  Patient identified by MRN, date of birth, ID band  Reviewed: Allergy & Precautions, H&P , NPO status , Patient's Chart, lab work & pertinent test results, reviewed documented beta blocker date and time   History of Anesthesia Complications (+) PONV and history of anesthetic complications  Airway Mallampati: II  TM Distance: >3 FB     Dental no notable dental hx. (+) Teeth Intact   Pulmonary asthma , pneumonia, resolved,    Pulmonary exam normal        Cardiovascular Normal cardiovascular exam     Neuro/Psych  Headaches, Anxiety Depression    GI/Hepatic Neg liver ROS, GERD  ,  Endo/Other    Renal/GU      Musculoskeletal   Abdominal   Peds  Hematology  (+) Blood dyscrasia, anemia ,   Anesthesia Other Findings   Reproductive/Obstetrics                             Anesthesia Physical Anesthesia Plan  ASA: III  Anesthesia Plan: General   Post-op Pain Management:    Induction:   PONV Risk Score and Plan: 3 and TIVA and Ondansetron  Airway Management Planned:   Additional Equipment:   Intra-op Plan:   Post-operative Plan:   Informed Consent: I have reviewed the patients History and Physical, chart, labs and discussed the procedure including the risks, benefits and alternatives for the proposed anesthesia with the patient or authorized representative who has indicated his/her understanding and acceptance.       Plan Discussed with: CRNA  Anesthesia Plan Comments:         Anesthesia Quick Evaluation

## 2018-12-23 NOTE — Discharge Instructions (Signed)
Implanted Port Home Guide °An implanted port is a device that is placed under the skin. It is usually placed in the chest. The device can be used to give IV medicine, to take blood, or for dialysis. You may have an implanted port if: °· You need IV medicine that would be irritating to the small veins in your hands or arms. °· You need IV medicines, such as antibiotics, for a long period of time. °· You need IV nutrition for a long period of time. °· You need dialysis. °Having a port means that your health care provider will not need to use the veins in your arms for these procedures. You may have fewer limitations when using a port than you would if you used other types of long-term IVs, and you will likely be able to return to normal activities after your incision heals. °An implanted port has two main parts: °· Reservoir. The reservoir is the part where a needle is inserted to give medicines or draw blood. The reservoir is round. After it is placed, it appears as a small, raised area under your skin. °· Catheter. The catheter is a thin, flexible tube that connects the reservoir to a vein. Medicine that is inserted into the reservoir goes into the catheter and then into the vein. °How is my port accessed? °To access your port: °· A numbing cream may be placed on the skin over the port site. °· Your health care provider will put on a mask and sterile gloves. °· The skin over your port will be cleaned carefully with a germ-killing soap and allowed to dry. °· Your health care provider will gently pinch the port and insert a needle into it. °· Your health care provider will check for a blood return to make sure the port is in the vein and is not clogged. °· If your port needs to remain accessed to get medicine continuously (constant infusion), your health care provider will place a clear bandage (dressing) over the needle site. The dressing and needle will need to be changed every week, or as told by your health care  provider. °What is flushing? °Flushing helps keep the port from getting clogged. Follow instructions from your health care provider about how and when to flush the port. Ports are usually flushed with saline solution or a medicine called heparin. The need for flushing will depend on how the port is used: °· If the port is only used from time to time to give medicines or draw blood, the port may need to be flushed: °? Before and after medicines have been given. °? Before and after blood has been drawn. °? As part of routine maintenance. Flushing may be recommended every 4-6 weeks. °· If a constant infusion is running, the port may not need to be flushed. °· Throw away any syringes in a disposal container that is meant for sharp items (sharps container). You can buy a sharps container from a pharmacy, or you can make one by using an empty hard plastic bottle with a cover. °How long will my port stay implanted? °The port can stay in for as long as your health care provider thinks it is needed. When it is time for the port to come out, a surgery will be done to remove it. The surgery will be similar to the procedure that was done to put the port in. °Follow these instructions at home: ° °· Flush your port as told by your health care provider. °·   If you need an infusion over several days, follow instructions from your health care provider about how to take care of your port site. Make sure you: ? Wash your hands with soap and water before you change your dressing. If soap and water are not available, use alcohol-based hand sanitizer. ? Change your dressing as told by your health care provider. ? Place any used dressings or infusion bags into a plastic bag. Throw that bag in the trash. ? Keep the dressing that covers the needle clean and dry. Do not get it wet. ? Do not use scissors or sharp objects near the tube. ? Keep the tube clamped, unless it is being used.  Check your port site every day for signs of  infection. Check for: ? Redness, swelling, or pain. ? Fluid or blood. ? Pus or a bad smell.  Protect the skin around the port site. ? Avoid wearing bra straps that rub or irritate the site. ? Protect the skin around your port from seat belts. Place a soft pad over your chest if needed.  Bathe or shower as told by your health care provider. The site may get wet as long as you are not actively receiving an infusion.  Return to your normal activities as told by your health care provider. Ask your health care provider what activities are safe for you.  Carry a medical alert card or wear a medical alert bracelet at all times. This will let health care providers know that you have an implanted port in case of an emergency. Get help right away if:  You have redness, swelling, or pain at the port site.  You have fluid or blood coming from your port site.  You have pus or a bad smell coming from the port site.  You have a fever. Summary  Implanted ports are usually placed in the chest for long-term IV access.  Follow instructions from your health care provider about flushing the port and changing bandages (dressings).  Take care of the area around your port by avoiding clothing that puts pressure on the area, and by watching for signs of infection.  Protect the skin around your port from seat belts. Place a soft pad over your chest if needed.  Get help right away if you have a fever or you have redness, swelling, pain, drainage, or a bad smell at the port site. This information is not intended to replace advice given to you by your health care provider. Make sure you discuss any questions you have with your health care provider. Document Released: 05/27/2005 Document Revised: 09/18/2018 Document Reviewed: 06/29/2016 Elsevier Patient Education  2020 [employer].  PATIENT INSTRUCTIONS POST-ANESTHESIA  IMMEDIATELY FOLLOWING SURGERY:  Do not drive or operate machinery for the first  twenty four hours after surgery.  Do not make any important decisions for twenty four hours after surgery or while taking narcotic pain medications or sedatives.  If you develop intractable nausea and vomiting or a severe headache please notify your doctor immediately.  FOLLOW-UP:  Please make an appointment with your surgeon as instructed. You do not need to follow up with anesthesia unless specifically instructed to do so.  WOUND CARE INSTRUCTIONS (if applicable):  Keep a dry clean dressing on the anesthesia/puncture wound site if there is drainage.  Once the wound has quit draining you may leave it open to air.  Generally you should leave the bandage intact for twenty four hours unless there is drainage.  If the epidural  site drains for more than 36-48 hours please call the anesthesia department.  QUESTIONS?:  Please feel free to call your physician or the hospital operator if you have any questions, and they will be happy to assist you.

## 2018-12-23 NOTE — Interval H&P Note (Signed)
History and Physical Interval Note:  12/23/2018 7:12 AM  Pamela Long  has presented today for surgery, with the diagnosis of metastatic breast cancer.  The various methods of treatment have been discussed with the patient and family. After consideration of risks, benefits and other options for treatment, the patient has consented to  Procedure(s): INSERTION PORT-A-CATH WITH REMOVAL OF PICC (Left) as a surgical intervention.  The patient's history has been reviewed, patient examined, no change in status, stable for surgery.  I have reviewed the patient's chart and labs.  Questions were answered to the patient's satisfaction.     Aviva Signs

## 2018-12-23 NOTE — Anesthesia Postprocedure Evaluation (Signed)
Anesthesia Post Note  Patient: Pamela Long  Procedure(s) Performed: INSERTION PORT-A-CATH LEFT SUBCLAVIAN (Left )  Patient location during evaluation: PACU Anesthesia Type: General Level of consciousness: awake and alert and patient cooperative Pain management: pain level controlled Vital Signs Assessment: post-procedure vital signs reviewed and stable Respiratory status: spontaneous breathing Cardiovascular status: stable Postop Assessment: no apparent nausea or vomiting Anesthetic complications: no     Last Vitals:  Vitals:   12/23/18 0656 12/23/18 0802  BP:  131/87  Pulse:  96  Resp:  15  Temp:  36.7 C  SpO2: 96% 96%    Last Pain:  Vitals:   12/23/18 0654  TempSrc: Oral  PainSc: 0-No pain                 Kalep Full A

## 2018-12-23 NOTE — Transfer of Care (Signed)
Immediate Anesthesia Transfer of Care Note  Patient: Pamela Long  Procedure(s) Performed: INSERTION PORT-A-CATH LEFT SUBCLAVIAN (Left )  Patient Location: PACU  Anesthesia Type:General  Level of Consciousness: awake, alert , oriented and patient cooperative  Airway & Oxygen Therapy: Patient Spontanous Breathing  Post-op Assessment: Report given to RN and Post -op Vital signs reviewed and stable  Post vital signs: Reviewed and stable  Last Vitals:  Vitals Value Taken Time  BP 131/87 12/23/18 0802  Temp 36.7 C 12/23/18 0802  Pulse 95 12/23/18 0803  Resp 16 12/23/18 0803  SpO2 92 % 12/23/18 0803  Vitals shown include unvalidated device data.  Last Pain:  Vitals:   12/23/18 0654  TempSrc: Oral  PainSc: 0-No pain      Patients Stated Pain Goal: 4 (84/72/07 2182)  Complications: No apparent anesthesia complications

## 2018-12-24 ENCOUNTER — Encounter (HOSPITAL_COMMUNITY): Payer: Self-pay | Admitting: General Surgery

## 2018-12-24 ENCOUNTER — Ambulatory Visit (HOSPITAL_COMMUNITY): Payer: 59

## 2018-12-24 ENCOUNTER — Inpatient Hospital Stay (HOSPITAL_BASED_OUTPATIENT_CLINIC_OR_DEPARTMENT_OTHER): Payer: 59 | Admitting: Hematology

## 2018-12-24 ENCOUNTER — Inpatient Hospital Stay (HOSPITAL_COMMUNITY): Payer: 59

## 2018-12-24 ENCOUNTER — Ambulatory Visit (HOSPITAL_COMMUNITY)
Admission: RE | Admit: 2018-12-24 | Discharge: 2018-12-24 | Disposition: A | Discharge status: Home / Self Care | Payer: 59 | Source: Home / Self Care | Attending: Hematology | Admitting: Hematology

## 2018-12-24 ENCOUNTER — Inpatient Hospital Stay (HOSPITAL_COMMUNITY): Payer: 59 | Attending: Hematology

## 2018-12-24 VITALS — BP 126/53 | HR 110 | Temp 98.6°F | Resp 18 | Wt 204.0 lb

## 2018-12-24 VITALS — BP 116/80 | HR 101 | Temp 97.9°F | Resp 18

## 2018-12-24 DIAGNOSIS — K1230 Oral mucositis (ulcerative), unspecified: Secondary | ICD-10-CM | POA: Diagnosis not present

## 2018-12-24 DIAGNOSIS — Z170 Estrogen receptor positive status [ER+]: Secondary | ICD-10-CM | POA: Diagnosis not present

## 2018-12-24 DIAGNOSIS — C50112 Malignant neoplasm of central portion of left female breast: Secondary | ICD-10-CM | POA: Diagnosis not present

## 2018-12-24 DIAGNOSIS — G4700 Insomnia, unspecified: Secondary | ICD-10-CM

## 2018-12-24 DIAGNOSIS — Z9221 Personal history of antineoplastic chemotherapy: Secondary | ICD-10-CM

## 2018-12-24 DIAGNOSIS — C787 Secondary malignant neoplasm of liver and intrahepatic bile duct: Secondary | ICD-10-CM | POA: Diagnosis not present

## 2018-12-24 DIAGNOSIS — C50919 Malignant neoplasm of unspecified site of unspecified female breast: Secondary | ICD-10-CM

## 2018-12-24 DIAGNOSIS — K5900 Constipation, unspecified: Secondary | ICD-10-CM | POA: Insufficient documentation

## 2018-12-24 DIAGNOSIS — G629 Polyneuropathy, unspecified: Secondary | ICD-10-CM | POA: Insufficient documentation

## 2018-12-24 DIAGNOSIS — Z5111 Encounter for antineoplastic chemotherapy: Secondary | ICD-10-CM

## 2018-12-24 DIAGNOSIS — R188 Other ascites: Secondary | ICD-10-CM

## 2018-12-24 LAB — CBC WITH DIFFERENTIAL/PLATELET
Abs Immature Granulocytes: 0.01 10*3/uL (ref 0.00–0.07)
Basophils Absolute: 0.1 10*3/uL (ref 0.0–0.1)
Basophils Relative: 1 %
Eosinophils Absolute: 0 10*3/uL (ref 0.0–0.5)
Eosinophils Relative: 0 %
HCT: 28.6 % — ABNORMAL LOW (ref 36.0–46.0)
Hemoglobin: 8.8 g/dL — ABNORMAL LOW (ref 12.0–15.0)
Immature Granulocytes: 0 %
Lymphocytes Relative: 14 %
Lymphs Abs: 0.8 10*3/uL (ref 0.7–4.0)
MCH: 26.7 pg (ref 26.0–34.0)
MCHC: 30.8 g/dL (ref 30.0–36.0)
MCV: 86.9 fL (ref 80.0–100.0)
Monocytes Absolute: 0.7 10*3/uL (ref 0.1–1.0)
Monocytes Relative: 12 %
Neutro Abs: 4.1 10*3/uL (ref 1.7–7.7)
Neutrophils Relative %: 73 %
Platelets: 223 10*3/uL (ref 150–400)
RBC: 3.29 MIL/uL — ABNORMAL LOW (ref 3.87–5.11)
RDW: 20 % — ABNORMAL HIGH (ref 11.5–15.5)
WBC: 5.6 10*3/uL (ref 4.0–10.5)
nRBC: 0 % (ref 0.0–0.2)

## 2018-12-24 LAB — COMPREHENSIVE METABOLIC PANEL
ALT: 24 U/L (ref 0–44)
AST: 50 U/L — ABNORMAL HIGH (ref 15–41)
Albumin: 2.2 g/dL — ABNORMAL LOW (ref 3.5–5.0)
Alkaline Phosphatase: 184 U/L — ABNORMAL HIGH (ref 38–126)
Anion gap: 8 (ref 5–15)
BUN: 11 mg/dL (ref 6–20)
CO2: 28 mmol/L (ref 22–32)
Calcium: 8.5 mg/dL — ABNORMAL LOW (ref 8.9–10.3)
Chloride: 103 mmol/L (ref 98–111)
Creatinine, Ser: 0.6 mg/dL (ref 0.44–1.00)
GFR calc Af Amer: >60 mL/min (ref >60)
GFR calc non Af Amer: >60 mL/min (ref >60)
Glucose, Bld: 135 mg/dL — ABNORMAL HIGH (ref 70–99)
Potassium: 3.1 mmol/L — ABNORMAL LOW (ref 3.5–5.1)
Sodium: 139 mmol/L (ref 135–145)
Total Bilirubin: 1.1 mg/dL (ref 0.3–1.2)
Total Protein: 5.6 g/dL — ABNORMAL LOW (ref 6.5–8.1)

## 2018-12-24 IMAGING — US PARACENTESIS WITH ULTRASOUND GUIDANCE
1 series · 2 of 2 positions shown · non-contrast
Comparison: none

INDICATION: Ascites.

[Series 1: paracentesis with ultrasound guidance · 2 of 2 slices shown]
[im 1/2]
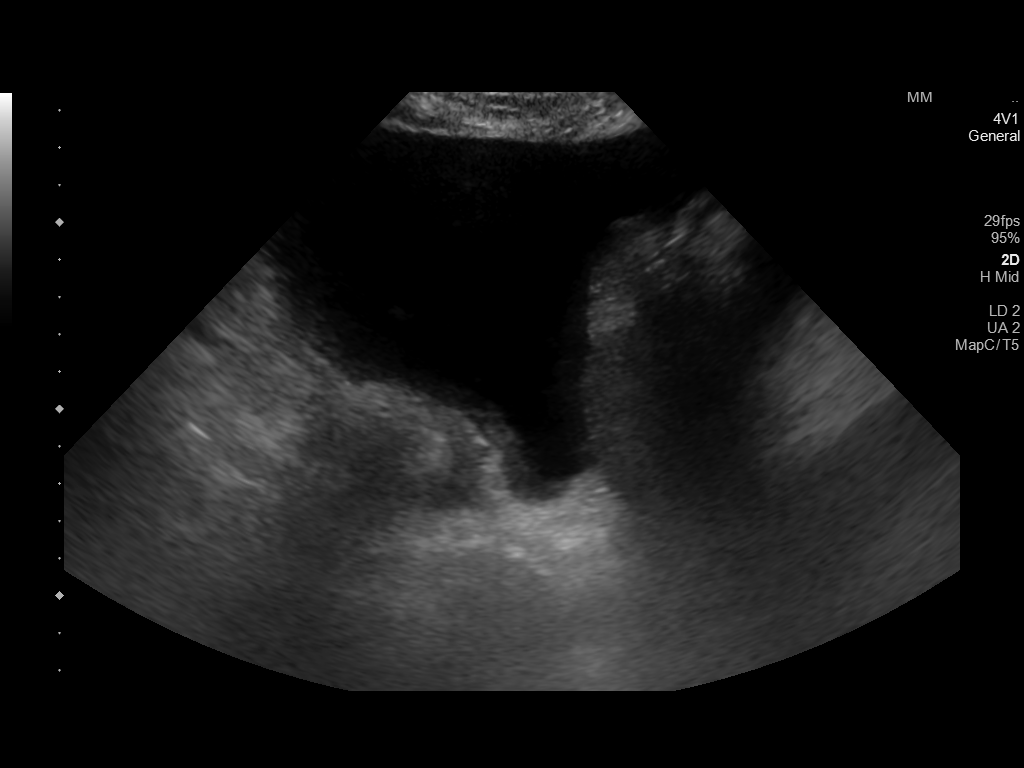
[im 2/2]
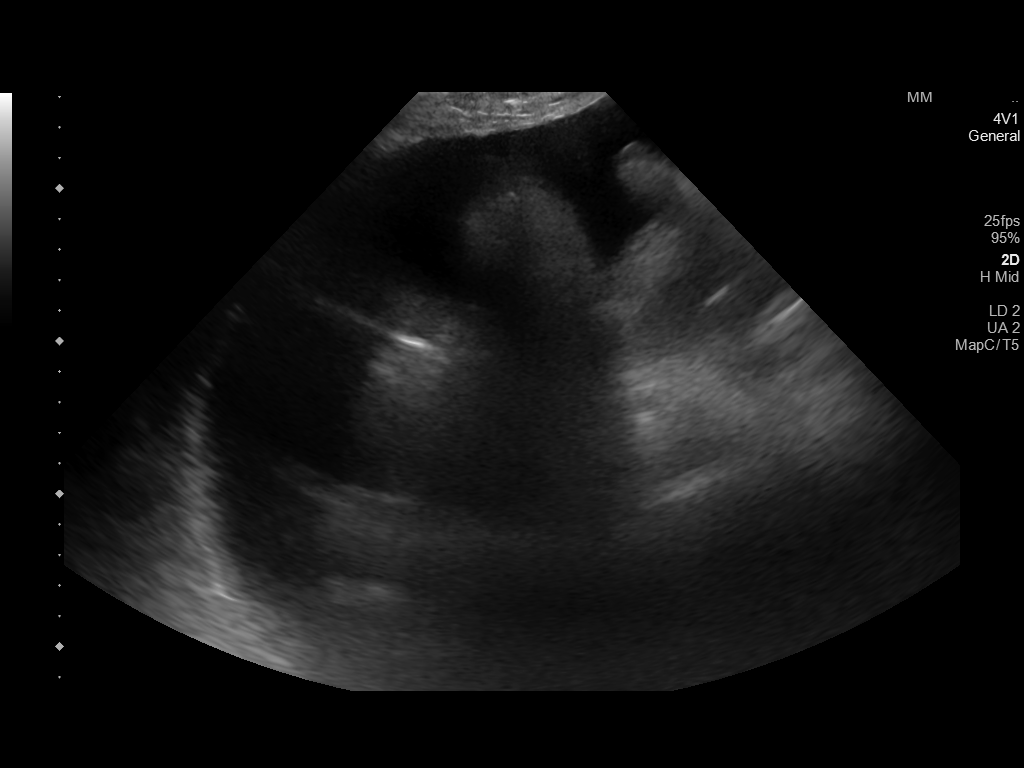

[2 of 2 positions shown; findings below may reference images not displayed]

EXAM:
ULTRASOUND GUIDED therapeutic PARACENTESIS

MEDICATIONS:
None.

COMPLICATIONS:
None immediate.

PROCEDURE:
Informed written consent was obtained from the patient after a
discussion of the risks, benefits and alternatives to treatment. A
timeout was performed prior to the initiation of the procedure.

Initial ultrasound scanning demonstrates a large amount of ascites
within the right lower abdominal quadrant. The right lower abdomen
was prepped and draped in the usual sterile fashion. 1% lidocaine
with epinephrine was used for local anesthesia.

Following this, a paracentesis catheter was introduced. An
ultrasound image was saved for documentation purposes. The
paracentesis was performed. The catheter was removed and a dressing
was applied. The patient tolerated the procedure well without
immediate post procedural complication.
FINDINGS: A total of approximately 4 L of cloudy fluid was removed.
IMPRESSION: Successful ultrasound-guided paracentesis yielding 4 L liters of
peritoneal fluid.

## 2018-12-24 MED ORDER — DIPHENHYDRAMINE HCL 50 MG/ML IJ SOLN
INTRAMUSCULAR | Status: AC
  Filled 2018-12-24: qty 1

## 2018-12-24 MED ORDER — POTASSIUM CHLORIDE 10 MEQ/100ML IV SOLN
10.0000 meq | INTRAVENOUS | Status: AC
  Administered 2018-12-24 (×2): 10 meq via INTRAVENOUS
  Filled 2018-12-24 (×2): qty 100

## 2018-12-24 MED ORDER — FUROSEMIDE 20 MG PO TABS: 20.0000 mg | ORAL_TABLET | Freq: Every day | ORAL | 0 refills | Status: DC

## 2018-12-24 MED ORDER — SPIRONOLACTONE 25 MG PO TABS: 25.0000 mg | ORAL_TABLET | Freq: Every day | ORAL | 0 refills | Status: DC

## 2018-12-24 MED ORDER — SODIUM CHLORIDE 0.9 % IV SOLN
INTRAVENOUS | Status: DC
  Administered 2018-12-24: 10:00:00 via INTRAVENOUS

## 2018-12-24 MED ORDER — HEPARIN SOD (PORK) LOCK FLUSH 100 UNIT/ML IV SOLN
500.0000 [IU] | Freq: Once | INTRAVENOUS | Status: AC | PRN
  Administered 2018-12-24: 500 [IU]

## 2018-12-24 MED ORDER — ALBUMIN HUMAN 25 % IV SOLN
INTRAVENOUS | Status: AC
  Administered 2018-12-24: 14:00:00
  Filled 2018-12-24: qty 50

## 2018-12-24 MED ORDER — ALBUMIN HUMAN 25 % IV SOLN
INTRAVENOUS | Status: AC
  Administered 2018-12-24: 25 g via INTRAVENOUS
  Filled 2018-12-24: qty 50

## 2018-12-24 MED ORDER — SODIUM CHLORIDE 0.9 % IV SOLN
10.0000 mg | Freq: Once | INTRAVENOUS | Status: AC
  Administered 2018-12-24: 10 mg via INTRAVENOUS
  Filled 2018-12-24: qty 10

## 2018-12-24 MED ORDER — FAMOTIDINE IN NACL 20-0.9 MG/50ML-% IV SOLN
20.0000 mg | Freq: Once | INTRAVENOUS | Status: AC
  Administered 2018-12-24: 20 mg via INTRAVENOUS

## 2018-12-24 MED ORDER — HEPARIN SOD (PORK) LOCK FLUSH 100 UNIT/ML IV SOLN
500.0000 [IU] | Freq: Once | INTRAVENOUS | Status: AC
  Administered 2018-12-24: 500 [IU] via INTRAVENOUS

## 2018-12-24 MED ORDER — LIDOCAINE-PRILOCAINE 2.5-2.5 % EX CREA: 1.0000 "application " | TOPICAL_CREAM | CUTANEOUS | 3 refills | Status: DC | PRN

## 2018-12-24 MED ORDER — ALBUMIN HUMAN 25 % IV SOLN
25.0000 g | Freq: Once | INTRAVENOUS | Status: DC
  Filled 2018-12-24: qty 100

## 2018-12-24 MED ORDER — ALBUMIN HUMAN 25 % IV SOLN: 25.0000 g | Freq: Once | INTRAVENOUS | Status: DC

## 2018-12-24 MED ORDER — PALONOSETRON HCL INJECTION 0.25 MG/5ML
0.2500 mg | Freq: Once | INTRAVENOUS | Status: AC
  Administered 2018-12-24: 0.25 mg via INTRAVENOUS

## 2018-12-24 MED ORDER — SODIUM CHLORIDE 0.9 % IV SOLN
54.0000 mg/m2 | Freq: Once | INTRAVENOUS | Status: AC
  Administered 2018-12-24: 108 mg via INTRAVENOUS
  Filled 2018-12-24: qty 18

## 2018-12-24 MED ORDER — SODIUM CHLORIDE 0.9% FLUSH
10.0000 mL | INTRAVENOUS | Status: DC | PRN
  Administered 2018-12-24: 10 mL
  Filled 2018-12-24: qty 10

## 2018-12-24 MED ORDER — PALONOSETRON HCL INJECTION 0.25 MG/5ML
INTRAVENOUS | Status: AC
  Filled 2018-12-24: qty 5

## 2018-12-24 MED ORDER — ALBUMIN HUMAN 25 % IV SOLN
25.0000 g | Freq: Once | INTRAVENOUS | Status: AC
  Administered 2018-12-24: 14:00:00 25 g via INTRAVENOUS

## 2018-12-24 MED ORDER — FAMOTIDINE IN NACL 20-0.9 MG/50ML-% IV SOLN
INTRAVENOUS | Status: AC
  Filled 2018-12-24: qty 50

## 2018-12-24 MED ORDER — DIPHENHYDRAMINE HCL 50 MG/ML IJ SOLN
25.0000 mg | Freq: Once | INTRAMUSCULAR | Status: AC
  Administered 2018-12-24: 25 mg via INTRAVENOUS

## 2018-12-24 MED ORDER — HEPARIN SOD (PORK) LOCK FLUSH 100 UNIT/ML IV SOLN
INTRAVENOUS | Status: AC
  Administered 2018-12-24: 500 [IU] via INTRAVENOUS
  Filled 2018-12-24: qty 5

## 2018-12-24 NOTE — Assessment & Plan Note (Signed)
1.  Metastatic breast cancer to the liver: - Biopsy of the liver lesion on 06/26/2018 shows breast cancer, ER/PR positive and HER-2 negative. - PET scan on 07/13/2018 shows a very large centrally necrotic mass involving the right and left hepatic lobes, and other hypermetabolic lesion in the left lobe.  Multiple hypermetabolic lymph nodes in the upper abdomen and left supraclavicular space.  MRI of the T-spine shows possible small vertebral body metastasis at T5 and T8 on 08/31/2018. - Abemaciclib and Faslodex from 07/02/2018 through 09/07/2018 discontinued for progression. -ERCP and plastic stent in the hepatic duct on 09/22/2018 by Dr. Rehman - Paclitaxel weekly 3 weeks on 1 week off started on 09/18/2018, last treatment at 80 mg per metered square on 12/03/2018.  She could not tolerate Xeloda added to the regimen. - She was admitted with neutropenic fever from 12/12/2018 through 12/16/2018. -CT chest PE protocol on 12/13/2018 was negative.  Bilateral pleural effusions, left more than right and ascites seen. -Left thoracentesis on 12/14/2018, 120 mL's of transudate removed. - She has port placed yesterday.  Energy levels have improved since discharge. -We reviewed her blood work.  She may proceed with her next cycle of paclitaxel.  I will dose reduce it to 54 mg per metered square. -I will reevaluate her in 1 week.  I plan to do CT of the abdomen and pelvis after paracentesis to review response.  2.  Ascites: - Pleural fluid on 12/14/2018 from left thoracentesis was a transudate. -Ascites is likely from hepatic metastatic disease.  She complains of feeling full and cannot eat much.  She is not able to lie flat. -I have recommended US guided therapeutic paracentesis. -I will start her on Lasix 20 mg daily along with spironolactone 25 mg daily and will titrate up as needed to slow down the recurrent accumulation of ascites.  3.  Hypokalemia: -She has not taken potassium in the last 2 days.  Today potassium is 3.1.   She reports that she cannot swallow and would rather peripheral IV today.  We will give her 20 mEq IV.  She will resume her tablets when she goes home.  4.  Constipation: - She will continue stool softener and MiraLAX. 

## 2018-12-24 NOTE — Progress Notes (Signed)
[place], [place] [address]   CLINIC:  Medical Oncology/Hematology  PCP:  Doree Albee, MD [place] [phone]   REASON FOR VISIT:  Follow-up for metastatic breast cancer.   BRIEF ONCOLOGIC HISTORY:  Oncology History  Metastatic breast cancer ([place])  07/02/2018 Initial Diagnosis   Metastatic breast cancer ([place])   09/18/2018 -  Chemotherapy   The patient had palonosetron (ALOXI) injection 0.25 mg, 0.25 mg, Intravenous,  Once, 3 of 4 cycles Administration: 0.25 mg (10/01/2018), 0.25 mg (10/09/2018), 0.25 mg (10/23/2018), 0.25 mg (10/30/2018), 0.25 mg (11/12/2018), 0.25 mg (12/03/2018) PACLitaxel (TAXOL) 120 mg in sodium chloride 0.9 % 250 mL chemo infusion (</= 69m/m2), 60 mg/m2 = 120 mg (100 % of original dose 60 mg/m2), Intravenous,  Once, 3 of 4 cycles Dose modification: 60 mg/m2 (original dose 60 mg/m2, Cycle 1, Reason: Change in LFTs), 80 mg/m2 (original dose 60 mg/m2, Cycle 2, Reason: Provider Judgment), 54 mg/m2 (90 % of original dose 60 mg/m2, Cycle 3, Reason: Other (see comments), Comment: hepatic dysfunction) Administration: 120 mg (09/18/2018), 120 mg (10/01/2018), 120 mg (10/23/2018), 120 mg (10/09/2018), 120 mg (10/30/2018), 162 mg (11/12/2018), 162 mg (12/03/2018)  for chemotherapy treatment.       CANCER STAGING: Cancer Staging No matching staging information was found for the patient.   INTERVAL HISTORY:  Ms. 89. female seen for follow-up of metastatic breast cancer.  She was treated with Taxol last time on 12/03/2018.  She was admitted to the hospital on 12/12/2018 through 12/16/2018 with neutropenic fever.  A CT scan showed bilateral pleural effusions and negative for pulmonary embolism.  She had left thoracentesis done.  All her cultures were negative.  She has been feeling well since she got home.  She has some shortness of breath when she lies flat.  She also has easy satiety when she eats.   Appetite is 50%.  Energy levels are low.  Occasional burning of the feet present mostly at nighttime.  She also complains of leg swelling since she was hospitalized.  REVIEW OF SYSTEMS:  Review of Systems  Respiratory: Positive for shortness of breath.   Cardiovascular: Positive for leg swelling.  Gastrointestinal: Positive for constipation.  Neurological: Positive for numbness. Negative for dizziness.  All other systems reviewed and are negative.    PAST MEDICAL/SURGICAL HISTORY:  Past Medical History:  Diagnosis Date  . Anemia   . Anxiety   . Asthma   . Breast cancer ([place]    Left, 2017  . Complication of anesthesia   . Depression   . GERD (gastroesophageal reflux disease)   . Migraine   . OAB (overactive bladder)   . PONV (postoperative nausea and vomiting)   . Seasonal allergies    Past Surgical History:  Procedure Laterality Date  . ABDOMINAL HYSTERECTOMY     breast cancer  . ANKLE RECONSTRUCTION Right 1989  . BILIARY STENT PLACEMENT N/A 09/22/2018   Procedure: BILIARY STENT PLACEMENT;  Surgeon: RRogene Houston MD;  [facility];  Service: Endoscopy;  Laterality: N/A;  . BREAST SURGERY    . COLONOSCOPY WITH PROPOFOL N/A 08/06/2018   Procedure: COLONOSCOPY WITH PROPOFOL;  Surgeon: FDanie Binder MD;  Location: [facility];  Service: Endoscopy;  Laterality: N/A;  1:30pm  . ERCP N/A 09/22/2018   Procedure: ENDOSCOPIC RETROGRADE CHOLANGIOPANCREATOGRAPHY (ERCP);  Surgeon: RRogene Houston MD;  Location: [facility];  Service: Endoscopy;  Laterality: N/A;  .  FOREIGN BODY REMOVAL N/A 08/29/2017   from lip  . KNEE SURGERY Right 1989   bone spur  . MASTECTOMY  2017   bil mastectomies  . PORTACATH PLACEMENT Left 12/23/2018   Procedure: INSERTION PORT-A-CATH LEFT SUBCLAVIAN;  Surgeon: Aviva Signs, MD;  Location: [facility] OR[facility];  Service: General;  Laterality: Left;  . SINUSOTOMY       SOCIAL HISTORY:  Social History   Socioeconomic History  . Marital status:  Single    Spouse name: Not on file  . Number of children: 1  . Years of education: 27  . Highest education level: Not on file  Occupational History  . Occupation: disabled  Social Needs  . Financial resource strain: Somewhat hard  . Food insecurity    Worry: Sometimes true    Inability: Sometimes true  . Transportation needs    Medical: No    Non-medical: No  Tobacco Use  . Smoking status: Never Smoker  . Smokeless tobacco: Never Used  Substance and Sexual Activity  . Alcohol use: No  . Drug use: No  . Sexual activity: Not Currently  Lifestyle  . Physical activity    Days per week: 0 days    Minutes per session: 0 min  . Stress: Rather much  Relationships  . Social Herbalist on phone: Once a week    Gets together: Once a week    Attends religious service: 1 to 4 times per year    Active member of club or organization: Yes    Attends meetings of clubs or organizations: Never    Relationship status: Never married  . Intimate partner violence    Fear of current or ex partner: No    Emotionally abused: No    Physically abused: No    Forced sexual activity: No  Other Topics Concern  . Not on file  Social History Narrative   Bachelors degree   Accounting   Lives with daughter [name] who has autism and diabetes   Likes to sew, quilt, crafts, crochet    FAMILY HISTORY:  Family History  Problem Relation Age of Onset  . Arthritis Mother   . COPD Mother   . Depression Mother   . Diabetes Mother   . Kidney disease Father   . Heart disease Father 79  . Drug abuse Father   . Hypertension Father   . Diabetes Daughter   . Hashimoto's thyroiditis Daughter   . Irritable bowel syndrome Daughter   . Autism spectrum disorder Daughter   . Early death Maternal Grandmother        drowned  . Early death Maternal Grandfather   . Heart disease Maternal Grandfather   . Heart disease Paternal Grandfather   . Breast cancer Maternal Aunt   . Breast cancer Cousin   .  Lung cancer Maternal Aunt   . Lung cancer Maternal Uncle   . Colon cancer Neg Hx     CURRENT MEDICATIONS:  Facility-Administered Encounter Medications as of 12/24/2018  Medication  . Chlorhexidine Gluconate Cloth 2 % PADS 6 each   And  . Chlorhexidine Gluconate Cloth 2 % PADS 6 each  . fentaNYL (SUBLIMAZE) injection 25-50 mcg  . lactated ringers infusion   Outpatient Encounter Medications as of 12/24/2018  Medication Sig  . acetaminophen (TYLENOL) 325 MG tablet Take 2 tablets (650 mg total) by mouth every 6 (six) hours as needed for mild pain, fever or headache (T over 101).  Marland Kitchen b complex vitamins  capsule Take 1 capsule by mouth daily.  . Cholecalciferol (VITAMIN D3 GUMMIES) 25 MCG (1000 UT) CHEW Chew 2,000-4,000 Units by mouth daily.  . escitalopram (LEXAPRO) 20 MG tablet Take 1 tablet (20 mg total) by mouth daily.  . Lysine 1000 MG TABS Take 1,000 mg by mouth daily.  . magic mouthwash w/lidocaine SOLN Take 5 mLs by mouth 4 (four) times daily as needed for mouth pain.  . magnesium oxide (MAG-OX) 400 MG tablet TAKE 1 TABLET BY MOUTH EVERY DAY  . nystatin (MYCOSTATIN) 100000 UNIT/ML suspension Take 5 mLs (500,000 Units total) by mouth 4 (four) times daily. Swish and gargle - then either spit or swallow  . ondansetron (ZOFRAN-ODT) 8 MG disintegrating tablet TAKE 1 TABLET (8 MG TOTAL) BY MOUTH EVERY 8 (EIGHT) HOURS AS NEEDED FOR NAUSEA OR VOMITING.  . oxyCODONE-acetaminophen (PERCOCET) 5-325 MG tablet Take 1 tablet by mouth every 4 (four) hours as needed for moderate pain.  . PACLitaxel (TAXOL IV) Inject into the vein once a week.   . pantoprazole (PROTONIX) 40 MG tablet TAKE 1 TABLET BY MOUTH EVERY DAY BEFORE BREAKFAST (Patient taking differently: Take 40 mg by mouth daily. )  . Polyvinyl Alcohol-Povidone PF (REFRESH) 1.4-0.6 % SOLN Place 1 drop into both eyes as needed (for dry eyes).   . potassium chloride SA (K-DUR) 20 MEQ tablet Take 2 tablets (40 mEq total) by mouth daily.  .  prochlorperazine (COMPAZINE) 10 MG tablet Take 1 tablet (10 mg total) by mouth every 6 (six) hours as needed for nausea or vomiting.  . promethazine (PHENERGAN) 25 MG tablet Take 1 tablet (25 mg total) by mouth every 6 (six) hours as needed for nausea or vomiting.  . rizatriptan (MAXALT-MLT) 10 MG disintegrating tablet Take 10 mg by mouth every 2 (two) hours as needed for migraine.   . senna-docusate (SENOKOT-S) 8.6-50 MG tablet Take 2 tablets by mouth at bedtime as needed for mild constipation or moderate constipation.  . temazepam (RESTORIL) 15 MG capsule Take 1 capsule (15 mg total) by mouth at bedtime as needed for sleep.  . furosemide (LASIX) 20 MG tablet Take 1 tablet (20 mg total) by mouth daily.  . spironolactone (ALDACTONE) 25 MG tablet Take 1 tablet (25 mg total) by mouth daily.  . zolpidem (AMBIEN) 10 MG tablet Take 1 tablet (10 mg total) by mouth at bedtime as needed for up to 30 days for sleep.    ALLERGIES:  Allergies  Allergen Reactions  . Corn-Containing Products Other (See Comments)    Headache and GI upset  . Salagen [Pilocarpine] Other (See Comments)    Can cause liver failure   . Tape Itching and Other (See Comments)    Depending on the adhesive-blistering occurs  . Amoxicillin Hives and Other (See Comments)    Rash only DID THE REACTION INVOLVE: Swelling of the face/tongue/throat, SOB, or low BP? Sudden or severe rash/hives, skin peeling, or the inside of the mouth or nose?  Did it require medical treatment?  When did it last happen? If all above answers are "NO", may proceed with cephalosporin use.   . Caffeine Diarrhea, Nausea Only, Palpitations and Other (See Comments)    Headache  . Tetanus Toxoids Swelling and Other (See Comments)    Local reaction     PHYSICAL EXAM:  ECOG Performance status: 1 Blood pressure is 126/53.  Pulse rate is 110.  Respirate is 18.  Temperature 98.6.  Saturations are 96%. Physical Exam Vitals signs reviewed.   Constitutional:        Appearance: Normal appearance.  Cardiovascular:     Rate and Rhythm: Normal rate and regular rhythm.     Heart sounds: Normal heart sounds.  Pulmonary:     Effort: Pulmonary effort is normal.     Breath sounds: Normal breath sounds.  Abdominal:     General: There is no distension.     Palpations: Abdomen is soft. There is no mass.  Musculoskeletal:        General: No swelling.  Skin:    General: Skin is warm.  Neurological:     General: No focal deficit present.     Mental Status: She is alert and oriented to person, place, and time.  Psychiatric:        Mood and Affect: Mood normal.        Behavior: Behavior normal.      LABORATORY DATA:  I have reviewed the labs as listed.  CBC    Component Value Date/Time   WBC 5.6 12/24/2018 0807   RBC 3.29 (L) 12/24/2018 0807   HGB 8.8 (L) 12/24/2018 0807   HCT 28.6 (L) 12/24/2018 0807   PLT 223 12/24/2018 0807   MCV 86.9 12/24/2018 0807   MCH 26.7 12/24/2018 0807   MCHC 30.8 12/24/2018 0807   RDW 20.0 (H) 12/24/2018 0807   LYMPHSABS 0.8 12/24/2018 0807   MONOABS 0.7 12/24/2018 0807   EOSABS 0.0 12/24/2018 0807   BASOSABS 0.1 12/24/2018 0807   CMP Latest Ref Rng & Units 12/24/2018 12/16/2018 12/15/2018  Glucose 70 - 99 mg/dL 135(H) 92 89  BUN 6 - 20 mg/dL _0 Creatinine 0.44 - 1.00 mg/dL 0.60 0.49 0.54  Sodium 135 - 145 mmol/L 139 134(L) 136  Potassium 3.5 - 5.1 mmol/L 3.1(L) 2.9(L) 3.1(L)  Chloride 98 - 111 mmol/L 103 104 105  CO2 22 - 32 mmol/L _1 Calcium 8.9 - 10.3 mg/dL 8.5(L) 7.8(L) 7.8(L)  Total Protein 6.5 - 8.1 g/dL 5.6(L) 4.7(L) 4.6(L)  Total Bilirubin 0.3 - 1.2 mg/dL 1.1 1.0 1.2  Alkaline Phos 38 - 126 U/L 184(H) 160(H) 136(H)  AST 15 - 41 U/L 50(H) 45(H) 43(H)  ALT 0 - 44 U/L _2 DIAGNOSTIC IMAGING:  I have independently reviewed the scans and discussed with the patient.   I have reviewed Venita Lick LPN's note and agree with the documentation.  I personally  performed a face-to-face visit, made revisions and my assessment and plan is as follows.    ASSESSMENT & PLAN:   Metastatic breast cancer ([place]) 1.  Metastatic breast cancer to the liver: - Biopsy of the liver lesion on 06/26/2018 shows breast cancer, ER/PR positive and HER-2 negative. - PET scan on 07/13/2018 shows a very large centrally necrotic mass involving the right and left hepatic lobes, and other hypermetabolic lesion in the left lobe.  Multiple hypermetabolic lymph nodes in the upper abdomen and left supraclavicular space.  MRI of the T-spine shows possible small vertebral body metastasis at T5 and T8 on 08/31/2018. - Abemaciclib and Faslodex from 07/02/2018 through 09/07/2018 discontinued for progression. -ERCP and plastic stent in the hepatic duct on 09/22/2018 by Dr. Laural Golden - Paclitaxel weekly 3 weeks on 1 week off started on 09/18/2018, last treatment at 80 mg per metered square on 12/03/2018.  She could not tolerate Xeloda added to the regimen. - She was admitted with neutropenic fever from 12/12/2018 through 12/16/2018. -CT chest PE protocol on 12/13/2018 was negative.  Bilateral pleural  effusions, left more than right and ascites seen. -Left thoracentesis on 12/14/2018, 120 mL's of transudate removed. - She has port placed yesterday.  Energy levels have improved since discharge. -We reviewed her blood work.  She may proceed with her next cycle of paclitaxel.  I will dose reduce it to 54 mg per metered square. -I will reevaluate her in 1 week.  I plan to do CT of the abdomen and pelvis after paracentesis to review response.  2.  Ascites: - Pleural fluid on 12/14/2018 from left thoracentesis was a transudate. -Ascites is likely from hepatic metastatic disease.  She complains of feeling full and cannot eat much.  She is not able to lie flat. -I have recommended US guided therapeutic paracentesis. -I will start her on Lasix 20 mg daily along with spironolactone 25 mg daily and will titrate up as  needed to slow down the recurrent accumulation of ascites.  3.  Hypokalemia: -She has not taken potassium in the last 2 days.  Today potassium is 3.1.  She reports that she cannot swallow and would rather peripheral IV today.  We will give her 20 mEq IV.  She will resume her tablets when she goes home.  4.  Constipation: - She will continue stool softener and MiraLAX.  Total time spent is 40 minutes with more than 50% of the time spent face-to-face reviewing hospital records, discussing new treatment plan, counseling and coordination of care.  Orders placed this encounter:  Orders Placed This Encounter  Procedures  . US Paracentesis  . CT Abdomen Pelvis W Contrast       , MD  [facility] [phone]    

## 2018-12-24 NOTE — Progress Notes (Signed)
Patient BP improved with repositioning of arm. Patient denies filling lighthead or dizzy. Tolerating well.

## 2018-12-24 NOTE — Progress Notes (Signed)
[id number] Labs reviewed with and pt seen by Dr. [name] and pt approved for Taxol infusion,with dose reduction, today with Potassium infusions ordered 20 meq IV for today as well per MD                     Pamela Long tolerated Taxol and potassium infusions well without complaints or incident.CXR results reviewed prior to accessing portacath today.Port left accessed,saline locked and flushed to be used for Paracentesis which pt is going to radiology for at this time. VSS upon discharge. Pt discharged via wheelchair in satisfactory condition and transferred to radiology department for paracentesis

## 2018-12-24 NOTE — Progress Notes (Signed)
This RN serving as Oceanographer during Paracentesis.

## 2018-12-24 NOTE — Patient Instructions (Signed)
[facility] Discharge Instructions for Patients Receiving Chemotherapy   Beginning January 23rd 2017 lab work for the [facility] will be done in the  Main lab at [facility] on 1st floor. If you have a lab appointment with the [facility] please come in thru the  Main Entrance and check in at the main information desk   Today you received the following chemotherapy agents Taxol as well as Potassium infusions. Follow-up as scheduled. Call clinic for any questions or concerns  To help prevent nausea and vomiting after your treatment, we encourage you to take your nausea medication   If you develop nausea and vomiting, or diarrhea that is not controlled by your medication, call the clinic.  The clinic phone number is [phone]. Office hours are Monday-Friday 8:30am-5:00pm.  BELOW ARE SYMPTOMS THAT SHOULD BE REPORTED IMMEDIATELY:  *FEVER GREATER THAN 101.0 F  *CHILLS WITH OR WITHOUT FEVER  NAUSEA AND VOMITING THAT IS NOT CONTROLLED WITH YOUR NAUSEA MEDICATION  *UNUSUAL SHORTNESS OF BREATH  *UNUSUAL BRUISING OR BLEEDING  TENDERNESS IN MOUTH AND THROAT WITH OR WITHOUT PRESENCE OF ULCERS  *URINARY PROBLEMS  *BOWEL PROBLEMS  UNUSUAL RASH Items with * indicate a potential emergency and should be followed up as soon as possible. If you have an emergency after office hours please contact your primary care physician or go to the nearest emergency department.  Please call the clinic during office hours if you have any questions or concerns.   You may also contact the Patient Navigator at [phone] should you have any questions or need assistance in obtaining follow up care.      Resources For Cancer Patients and their Caregivers ? American Cancer Society: Can assist with transportation, wigs, general needs, runs Look Good Feel Better.        (205)414-6109 ? Cancer Care: Provides financial assistance, online support groups, medication/co-pay  assistance.  1-800-813-HOPE 548 754 7793) ? [place] Assists [place] cancer patients and their families through emotional , educational and financial support.  442-471-4478 ? [place] DSS Where to apply for food stamps, Medicaid and utility assistance. 5671482959 ? [facility]: Transportation to medical appointments. 240 770 1013 ? Social Security Administration: May apply for disability if have a Stage IV cancer. 406 120 2079 484-781-8519 ? [employer], Disability and Transit Services: Assists with nutrition, care and transit needs. [phone]

## 2018-12-29 ENCOUNTER — Ambulatory Visit (HOSPITAL_COMMUNITY)
Admission: RE | Admit: 2018-12-29 | Discharge: 2018-12-29 | Disposition: A | Discharge status: Home / Self Care | Payer: 59 | Source: Home / Self Care | Attending: Hematology | Admitting: Hematology

## 2018-12-29 ENCOUNTER — Other Ambulatory Visit: Payer: Self-pay

## 2018-12-29 ENCOUNTER — Other Ambulatory Visit (HOSPITAL_COMMUNITY): Payer: Self-pay | Admitting: *Deleted

## 2018-12-29 DIAGNOSIS — C787 Secondary malignant neoplasm of liver and intrahepatic bile duct: Secondary | ICD-10-CM | POA: Diagnosis not present

## 2018-12-29 DIAGNOSIS — C50919 Malignant neoplasm of unspecified site of unspecified female breast: Secondary | ICD-10-CM

## 2018-12-29 IMAGING — CT CT ABDOMEN AND PELVIS WITHOUT AND WITH CONTRAST
2 of 11 series · 12 of 46 positions shown, 14 images · IV contrast (Isovue)
Comparison: MRI abdomen [DATE] and CT scan [DATE]

CLINICAL DATA: Restaging metastatic breast cancer. History of
metastatic liver disease. Ongoing chemotherapy.

EXAM:
CT ABDOMEN AND PELVIS WITHOUT AND WITH CONTRAST
TECHNIQUE: Multidetector CT imaging of the abdomen and pelvis was performed
following the standard protocol before and following the bolus
administration of intravenous contrast.
CONTRAST:  100mL OMNIPAQUE IOHEXOL 300 MG/ML  SOLN

[Series 4: axial venous · axial · portal-venous · 0.74mm/px · z∈[+1067,+1451]mm · 9 of 162 slices shown, 11 images]
[im 17/162  soft-tissue]
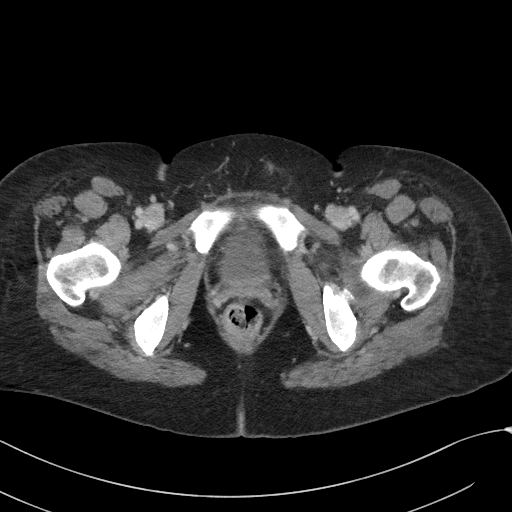
[im 17/162  bone]
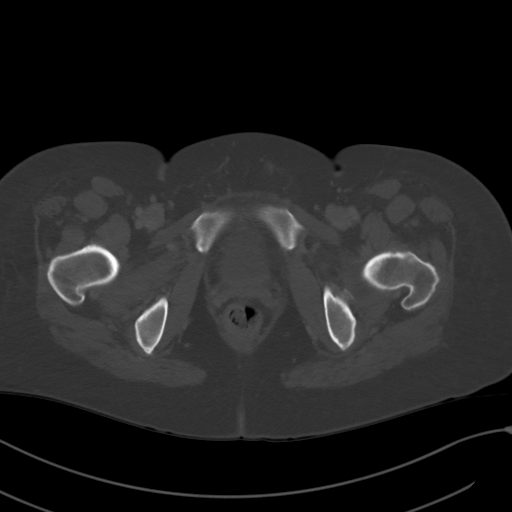
[im 33/162  soft-tissue]
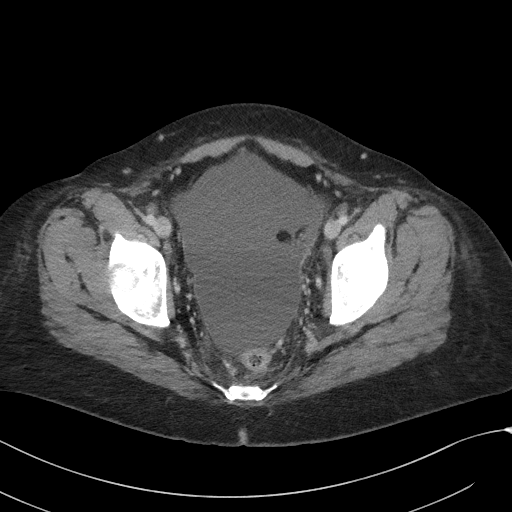
[im 49/162  soft-tissue]
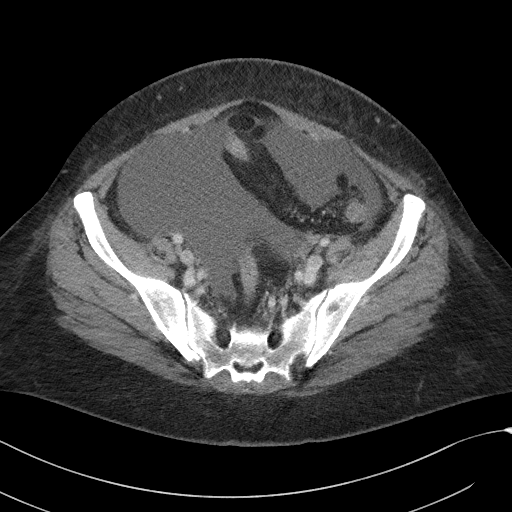
[im 65/162  soft-tissue]
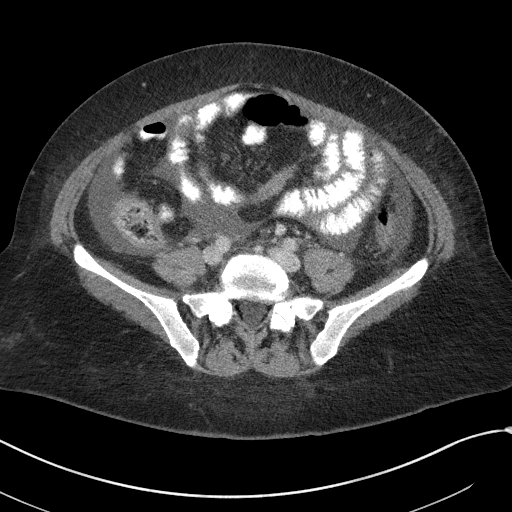
[im 81/162  soft-tissue]
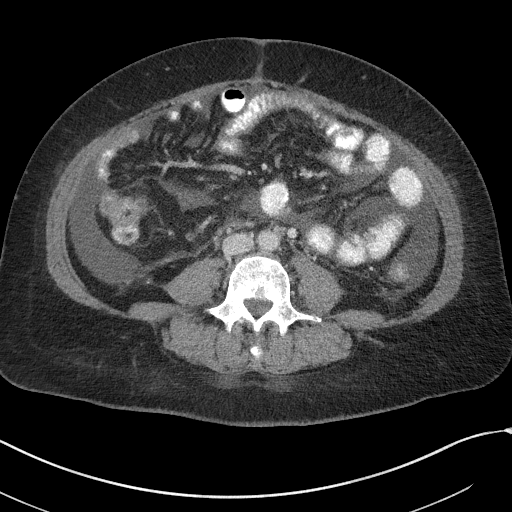
[im 97/162  soft-tissue]
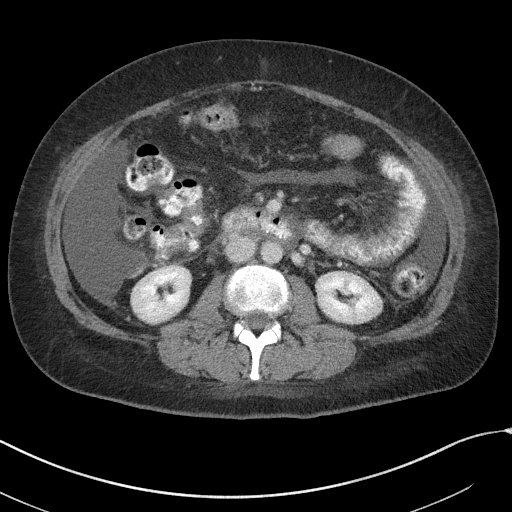
[im 113/162  soft-tissue]
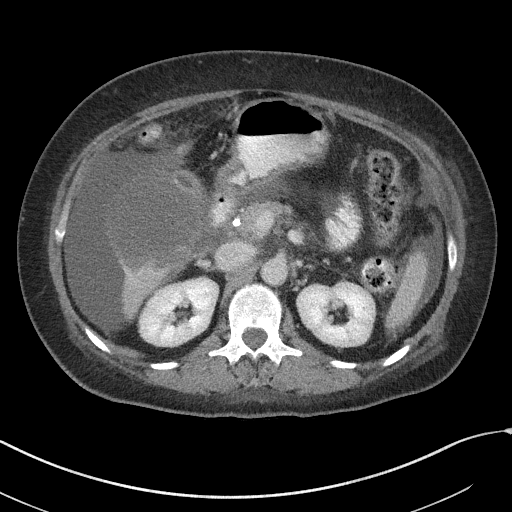
[im 129/162  soft-tissue]
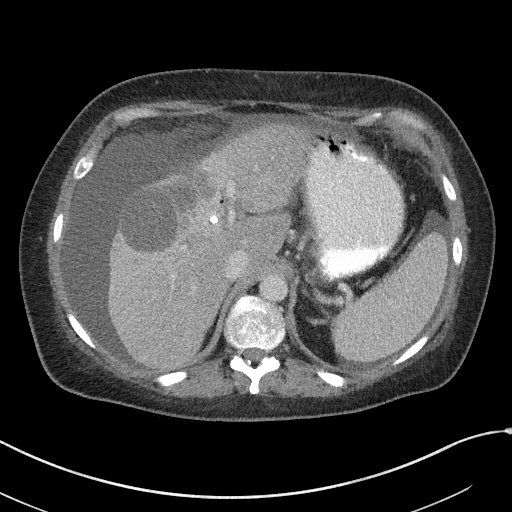
[im 145/162  soft-tissue]
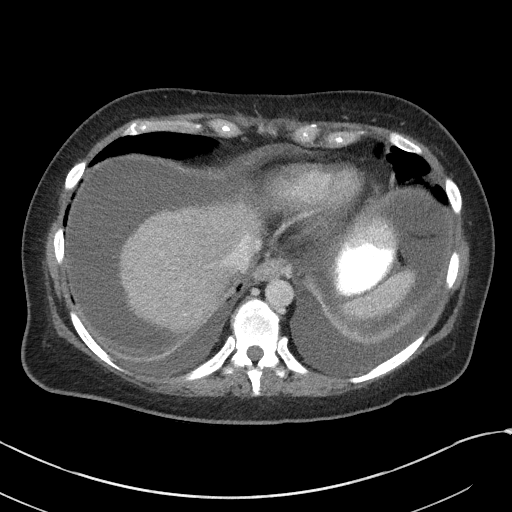
[im 145/162  bone]
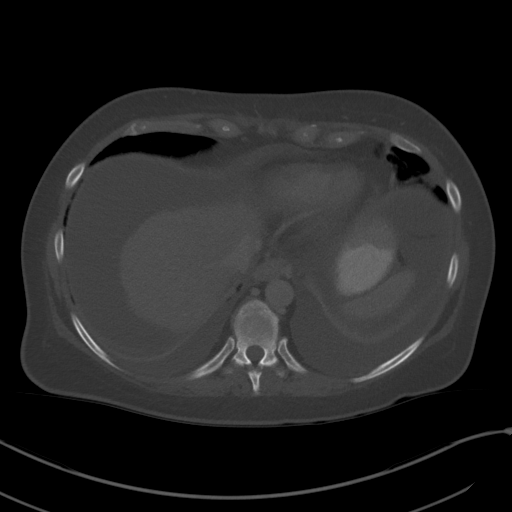

[Series 11: coronal arterial · coronal · arterial · 0.57mm/px · 3 of 104 slices shown]
[im 26/104  soft-tissue]
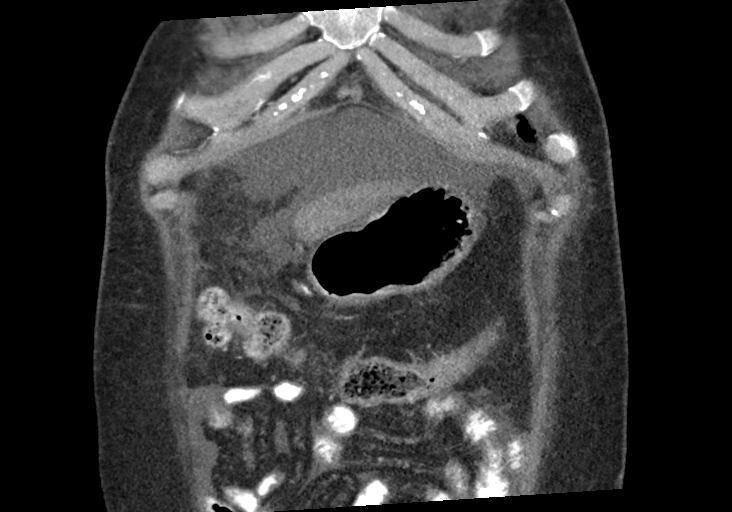
[im 52/104  soft-tissue]
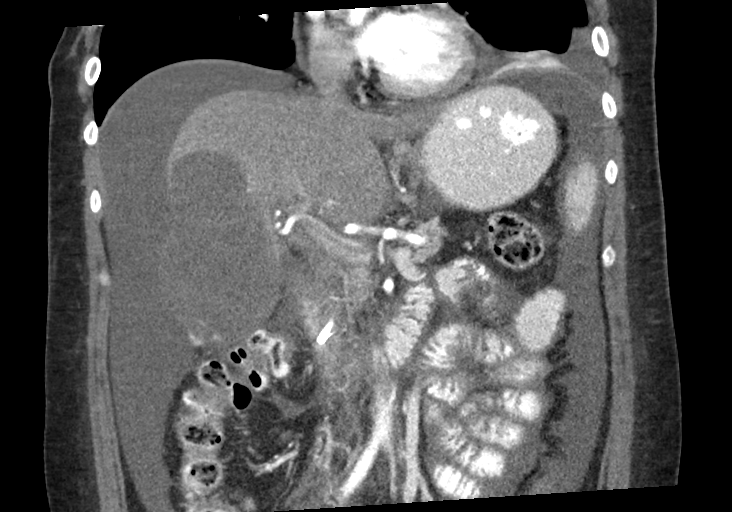
[im 78/104  soft-tissue]
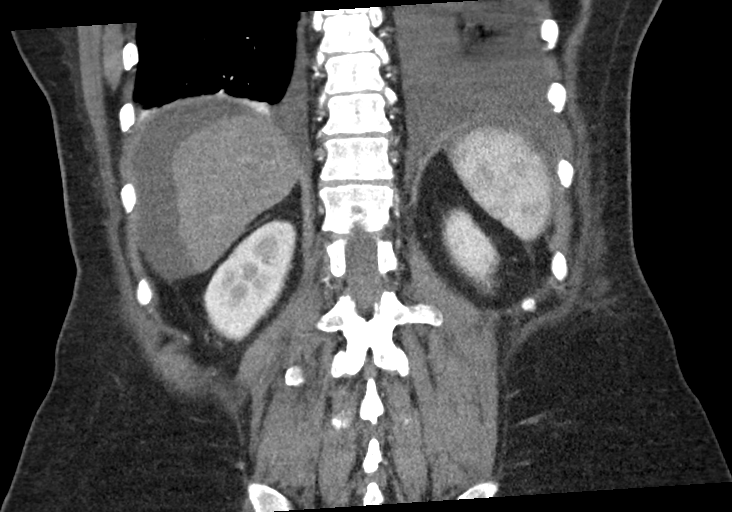

[12 of 46 positions shown; findings below may reference images not displayed]

FINDINGS: Lower chest: Small bilateral pleural effusions, left greater than
right with mild overlying atelectasis. No worrisome pulmonary
nodules. The heart is normal in size. Small amount of pericardial
fluid.

Hepatobiliary: Improved CT appearance of the liver. The large
confluent mass involving the right and left hepatic lobes on the
prior study measured a maximum of 19 cm. It now measures 10 cm. It
appears largely necrotic now. The liver contour remains irregular.
There are a few tiny scattered hepatic lesions. No new or
progressive findings.

Common bile duct stent in place.  No significant biliary dilatation.

Pancreas: No mass, inflammation or ductal dilatation.

Spleen: Normal size.  No focal lesions.

Adrenals/Urinary Tract: The adrenal glands and kidneys are
unremarkable. The bladder is grossly normal.

Stomach/Bowel: The stomach, duodenum, small bowel and colon are
grossly normal without oral contrast. No inflammatory changes, mass
lesions or obstructive findings. The terminal ileum and appendix are
normal.

Vascular/Lymphatic: The aorta is normal in caliber. No dissection.
The branch vessels are patent. The major venous structures are
patent. No mesenteric or retroperitoneal mass or adenopathy. Small
scattered lymph nodes are noted.

Reproductive: Surgically absent.

Other: Large volume abdominal/pelvic ascites. I do not see any
obvious omental caking or mesenteric implants.

Musculoskeletal: No significant bony findings. No worrisome lytic or
sclerotic bone lesions to suggest metastatic disease.
IMPRESSION: 1. Significant improved CT appearance of the liver as detailed
above. No new or progressive findings.
2. Large volume abdominal/pelvic ascites without obvious omental
lesions or mesenteric implants.
3. Common bile duct stent in good position without biliary
dilatation.
4. Bilateral pleural effusions, left greater than right with
overlying atelectasis. Small pericardial effusion also.
5. No CT findings to suggest osseous metastatic disease.

## 2018-12-29 MED ORDER — IOHEXOL 300 MG/ML  SOLN
100.0000 mL | Freq: Once | INTRAMUSCULAR | Status: AC | PRN
  Administered 2018-12-29: 15:00:00 100 mL via INTRAVENOUS

## 2018-12-31 ENCOUNTER — Inpatient Hospital Stay (HOSPITAL_COMMUNITY): Payer: 59

## 2018-12-31 ENCOUNTER — Inpatient Hospital Stay (HOSPITAL_BASED_OUTPATIENT_CLINIC_OR_DEPARTMENT_OTHER): Payer: 59 | Admitting: Hematology

## 2018-12-31 ENCOUNTER — Other Ambulatory Visit: Payer: Self-pay

## 2018-12-31 ENCOUNTER — Encounter (HOSPITAL_COMMUNITY): Payer: Self-pay | Admitting: Hematology

## 2018-12-31 DIAGNOSIS — C50919 Malignant neoplasm of unspecified site of unspecified female breast: Secondary | ICD-10-CM

## 2018-12-31 DIAGNOSIS — C787 Secondary malignant neoplasm of liver and intrahepatic bile duct: Secondary | ICD-10-CM | POA: Diagnosis not present

## 2018-12-31 DIAGNOSIS — Z9221 Personal history of antineoplastic chemotherapy: Secondary | ICD-10-CM | POA: Diagnosis not present

## 2018-12-31 DIAGNOSIS — K1230 Oral mucositis (ulcerative), unspecified: Secondary | ICD-10-CM | POA: Diagnosis not present

## 2018-12-31 DIAGNOSIS — C50112 Malignant neoplasm of central portion of left female breast: Secondary | ICD-10-CM | POA: Diagnosis not present

## 2018-12-31 DIAGNOSIS — G4700 Insomnia, unspecified: Secondary | ICD-10-CM | POA: Diagnosis not present

## 2018-12-31 DIAGNOSIS — K5900 Constipation, unspecified: Secondary | ICD-10-CM | POA: Diagnosis not present

## 2018-12-31 DIAGNOSIS — G629 Polyneuropathy, unspecified: Secondary | ICD-10-CM | POA: Diagnosis not present

## 2018-12-31 DIAGNOSIS — Z170 Estrogen receptor positive status [ER+]: Secondary | ICD-10-CM | POA: Diagnosis not present

## 2018-12-31 DIAGNOSIS — Z5111 Encounter for antineoplastic chemotherapy: Secondary | ICD-10-CM | POA: Diagnosis not present

## 2018-12-31 LAB — CBC WITH DIFFERENTIAL/PLATELET
Abs Immature Granulocytes: 0.01 10*3/uL (ref 0.00–0.07)
Basophils Absolute: 0 10*3/uL (ref 0.0–0.1)
Basophils Relative: 4 %
Eosinophils Absolute: 0 10*3/uL (ref 0.0–0.5)
Eosinophils Relative: 4 %
HCT: 27 % — ABNORMAL LOW (ref 36.0–46.0)
Hemoglobin: 8.6 g/dL — ABNORMAL LOW (ref 12.0–15.0)
Immature Granulocytes: 1 %
Lymphocytes Relative: 53 %
Lymphs Abs: 0.5 10*3/uL — ABNORMAL LOW (ref 0.7–4.0)
MCH: 26.6 pg (ref 26.0–34.0)
MCHC: 31.9 g/dL (ref 30.0–36.0)
MCV: 83.6 fL (ref 80.0–100.0)
Monocytes Absolute: 0.1 10*3/uL (ref 0.1–1.0)
Monocytes Relative: 7 %
Neutro Abs: 0.3 10*3/uL — ABNORMAL LOW (ref 1.7–7.7)
Neutrophils Relative %: 31 %
Platelets: 102 10*3/uL — ABNORMAL LOW (ref 150–400)
RBC: 3.23 MIL/uL — ABNORMAL LOW (ref 3.87–5.11)
RDW: 18.6 % — ABNORMAL HIGH (ref 11.5–15.5)
WBC: 1 10*3/uL — CL (ref 4.0–10.5)
nRBC: 0 % (ref 0.0–0.2)

## 2018-12-31 LAB — COMPREHENSIVE METABOLIC PANEL
ALT: 23 U/L (ref 0–44)
AST: 42 U/L — ABNORMAL HIGH (ref 15–41)
Albumin: 2.6 g/dL — ABNORMAL LOW (ref 3.5–5.0)
Alkaline Phosphatase: 149 U/L — ABNORMAL HIGH (ref 38–126)
Anion gap: 9 (ref 5–15)
BUN: 5 mg/dL — ABNORMAL LOW (ref 6–20)
CO2: 27 mmol/L (ref 22–32)
Calcium: 9.2 mg/dL (ref 8.9–10.3)
Chloride: 100 mmol/L (ref 98–111)
Creatinine, Ser: 0.51 mg/dL (ref 0.44–1.00)
GFR calc Af Amer: >60 mL/min (ref >60)
GFR calc non Af Amer: >60 mL/min (ref >60)
Glucose, Bld: 125 mg/dL — ABNORMAL HIGH (ref 70–99)
Potassium: 3 mmol/L — ABNORMAL LOW (ref 3.5–5.1)
Sodium: 136 mmol/L (ref 135–145)
Total Bilirubin: 1.4 mg/dL — ABNORMAL HIGH (ref 0.3–1.2)
Total Protein: 5.7 g/dL — ABNORMAL LOW (ref 6.5–8.1)

## 2018-12-31 MED ORDER — SODIUM CHLORIDE 0.9% FLUSH
10.0000 mL | Freq: Once | INTRAVENOUS | Status: AC
  Administered 2018-12-31: 10 mL via INTRAVENOUS

## 2018-12-31 MED ORDER — FILGRASTIM-SNDZ 480 MCG/0.8ML IJ SOSY
480.0000 ug | PREFILLED_SYRINGE | Freq: Once | INTRAMUSCULAR | Status: AC
  Administered 2018-12-31: 480 ug via SUBCUTANEOUS
  Filled 2018-12-31: qty 0.8

## 2018-12-31 MED ORDER — HEPARIN SOD (PORK) LOCK FLUSH 100 UNIT/ML IV SOLN
500.0000 [IU] | Freq: Once | INTRAVENOUS | Status: AC
  Administered 2018-12-31: 11:00:00 500 [IU] via INTRAVENOUS

## 2018-12-31 NOTE — Progress Notes (Signed)
[place], [place] [address]   CLINIC:  Medical Oncology/Hematology  PCP:  Doree Albee, MD [place] [phone]   REASON FOR VISIT:  Follow-up for metastatic breast cancer.   BRIEF ONCOLOGIC HISTORY:  Oncology History  Metastatic breast cancer ([place])  07/02/2018 Initial Diagnosis   Metastatic breast cancer ([place])   09/18/2018 -  Chemotherapy   The patient had palonosetron (ALOXI) injection 0.25 mg, 0.25 mg, Intravenous,  Once, 3 of 4 cycles Administration: 0.25 mg (10/01/2018), 0.25 mg (10/09/2018), 0.25 mg (10/23/2018), 0.25 mg (10/30/2018), 0.25 mg (11/12/2018), 0.25 mg (12/03/2018), 0.25 mg (12/24/2018) PACLitaxel (TAXOL) 120 mg in sodium chloride 0.9 % 250 mL chemo infusion (</= 48m/m2), 60 mg/m2 = 120 mg (100 % of original dose 60 mg/m2), Intravenous,  Once, 3 of 4 cycles Dose modification: 60 mg/m2 (original dose 60 mg/m2, Cycle 1, Reason: Change in LFTs), 80 mg/m2 (original dose 60 mg/m2, Cycle 2, Reason: Provider Judgment), 54 mg/m2 (90 % of original dose 60 mg/m2, Cycle 3, Reason: Other (see comments), Comment: hepatic dysfunction) Administration: 120 mg (09/18/2018), 120 mg (10/01/2018), 120 mg (10/23/2018), 120 mg (10/09/2018), 120 mg (10/30/2018), 162 mg (11/12/2018), 162 mg (12/03/2018), 108 mg (12/24/2018)  for chemotherapy treatment.       CANCER STAGING: Cancer Staging No matching staging information was found for the patient.   INTERVAL HISTORY:  Ms. 89. female seen for follow-up of metastatic breast cancer.  Appetite is 50%.  Energy levels are 25%.  Complains of neuropathic pain in the feet mostly at nighttime.  She has some constipation which is controlled if she takes lactulose.  Mild fatigue is stable.  Occasional nausea is also stable.  She is not taking diuretics as prescribed.  She had a paracentesis done on 12/24/2018.  She also had a CAT scan done on 12/29/2018.  REVIEW OF SYSTEMS:  Review  of Systems  Respiratory: Positive for shortness of breath.   Cardiovascular: Positive for leg swelling.  Gastrointestinal: Positive for constipation and nausea.  Neurological: Positive for numbness. Negative for dizziness.  All other systems reviewed and are negative.    PAST MEDICAL/SURGICAL HISTORY:  Past Medical History:  Diagnosis Date  . Anemia   . Anxiety   . Asthma   . Breast cancer ([place]    Left, 2017  . Complication of anesthesia   . Depression   . GERD (gastroesophageal reflux disease)   . Migraine   . OAB (overactive bladder)   . PONV (postoperative nausea and vomiting)   . Seasonal allergies    Past Surgical History:  Procedure Laterality Date  . ABDOMINAL HYSTERECTOMY     breast cancer  . ANKLE RECONSTRUCTION Right 1989  . BILIARY STENT PLACEMENT N/A 09/22/2018   Procedure: BILIARY STENT PLACEMENT;  Surgeon: RRogene Houston MD;  Location: [facility];  Service: Endoscopy;  Laterality: N/A;  . BREAST SURGERY    . COLONOSCOPY WITH PROPOFOL N/A 08/06/2018   Procedure: COLONOSCOPY WITH PROPOFOL;  Surgeon: FDanie Binder MD;  Location: [facility];  Service: Endoscopy;  Laterality: N/A;  1:30pm  . ERCP N/A 09/22/2018   Procedure: ENDOSCOPIC RETROGRADE CHOLANGIOPANCREATOGRAPHY (ERCP);  Surgeon: RRogene Houston MD;  Location: [facility];  Service: Endoscopy;  Laterality: N/A;  . FOREIGN BODY REMOVAL N/A 08/29/2017   from lip  . KNEE SURGERY Right 1989   bone spur  . MASTECTOMY  2017   bil mastectomies  .  PORTACATH PLACEMENT Left 12/23/2018   Procedure: INSERTION PORT-A-CATH LEFT SUBCLAVIAN;  Surgeon: Aviva Signs, MD;  Location: [facility] ORS;  Service: General;  Laterality: Left;  . SINUSOTOMY       SOCIAL HISTORY:  Social History   Socioeconomic History  . Marital status: Single    Spouse name: Not on file  . Number of children: 1  . Years of education: 12  . Highest education level: Not on file  Occupational History  . Occupation: disabled   Social Needs  . Financial resource strain: Somewhat hard  . Food insecurity    Worry: Sometimes true    Inability: Sometimes true  . Transportation needs    Medical: No    Non-medical: No  Tobacco Use  . Smoking status: Never Smoker  . Smokeless tobacco: Never Used  Substance and Sexual Activity  . Alcohol use: No  . Drug use: No  . Sexual activity: Not Currently  Lifestyle  . Physical activity    Days per week: 0 days    Minutes per session: 0 min  . Stress: Rather much  Relationships  . Social Herbalist on phone: Once a week    Gets together: Once a week    Attends religious service: 1 to 4 times per year    Active member of club or organization: Yes    Attends meetings of clubs or organizations: Never    Relationship status: Never married  . Intimate partner violence    Fear of current or ex partner: No    Emotionally abused: No    Physically abused: No    Forced sexual activity: No  Other Topics Concern  . Not on file  Social History Narrative   Bachelors degree   Accounting   Lives with daughter [name] who has autism and diabetes   Likes to sew, quilt, crafts, crochet    FAMILY HISTORY:  Family History  Problem Relation Age of Onset  . Arthritis Mother   . COPD Mother   . Depression Mother   . Diabetes Mother   . Kidney disease Father   . Heart disease Father 14  . Drug abuse Father   . Hypertension Father   . Diabetes Daughter   . Hashimoto's thyroiditis Daughter   . Irritable bowel syndrome Daughter   . Autism spectrum disorder Daughter   . Early death Maternal Grandmother        drowned  . Early death Maternal Grandfather   . Heart disease Maternal Grandfather   . Heart disease Paternal Grandfather   . Breast cancer Maternal Aunt   . Breast cancer Cousin   . Lung cancer Maternal Aunt   . Lung cancer Maternal Uncle   . Colon cancer Neg Hx     CURRENT MEDICATIONS:  Outpatient Encounter Medications as of 12/31/2018  Medication Sig   . acetaminophen (TYLENOL) 325 MG tablet Take 2 tablets (650 mg total) by mouth every 6 (six) hours as needed for mild pain, fever or headache (T over 101).  Marland Kitchen b complex vitamins capsule Take 1 capsule by mouth daily.  . Cholecalciferol (VITAMIN D3 GUMMIES) 25 MCG (1000 UT) CHEW Chew 2,000-4,000 Units by mouth daily.  Marland Kitchen escitalopram (LEXAPRO) 20 MG tablet Take 1 tablet (20 mg total) by mouth daily.  . furosemide (LASIX) 20 MG tablet Take 1 tablet (20 mg total) by mouth daily.  Marland Kitchen lidocaine-prilocaine (EMLA) cream Apply 1 application topically as needed.  Marland Kitchen Lysine 1000 MG TABS Take 1,000  mg by mouth daily.  . magic mouthwash w/lidocaine SOLN Take 5 mLs by mouth 4 (four) times daily as needed for mouth pain.  . magnesium oxide (MAG-OX) 400 MG tablet TAKE 1 TABLET BY MOUTH EVERY DAY  . nystatin (MYCOSTATIN) 100000 UNIT/ML suspension Take 5 mLs (500,000 Units total) by mouth 4 (four) times daily. Swish and gargle - then either spit or swallow  . ondansetron (ZOFRAN-ODT) 8 MG disintegrating tablet TAKE 1 TABLET (8 MG TOTAL) BY MOUTH EVERY 8 (EIGHT) HOURS AS NEEDED FOR NAUSEA OR VOMITING.  Marland Kitchen oxyCODONE-acetaminophen (PERCOCET) 5-325 MG tablet Take 1 tablet by mouth every 4 (four) hours as needed for moderate pain.  Marland Kitchen PACLitaxel (TAXOL IV) Inject into the vein once a week.   . pantoprazole (PROTONIX) 40 MG tablet TAKE 1 TABLET BY MOUTH EVERY DAY BEFORE BREAKFAST (Patient taking differently: Take 40 mg by mouth daily. )  . Polyvinyl Alcohol-Povidone PF (REFRESH) 1.4-0.6 % SOLN Place 1 drop into both eyes as needed (for dry eyes).   . potassium chloride SA (K-DUR) 20 MEQ tablet Take 2 tablets (40 mEq total) by mouth daily.  . prochlorperazine (COMPAZINE) 10 MG tablet Take 1 tablet (10 mg total) by mouth every 6 (six) hours as needed for nausea or vomiting.  . promethazine (PHENERGAN) 25 MG tablet Take 1 tablet (25 mg total) by mouth every 6 (six) hours as needed for nausea or vomiting.  . rizatriptan  (MAXALT-MLT) 10 MG disintegrating tablet Take 10 mg by mouth every 2 (two) hours as needed for migraine.   . senna-docusate (SENOKOT-S) 8.6-50 MG tablet Take 2 tablets by mouth at bedtime as needed for mild constipation or moderate constipation.  Marland Kitchen spironolactone (ALDACTONE) 25 MG tablet Take 1 tablet (25 mg total) by mouth daily.  . temazepam (RESTORIL) 15 MG capsule Take 1 capsule (15 mg total) by mouth at bedtime as needed for sleep.   No facility-administered encounter medications on file as of 12/31/2018.     ALLERGIES:  Allergies  Allergen Reactions  . Corn-Containing Products Other (See Comments)    Headache and GI upset  . Salagen [Pilocarpine] Other (See Comments)    Can cause liver failure   . Tape Itching and Other (See Comments)    Depending on the adhesive-blistering occurs  . Amoxicillin Hives and Other (See Comments)    Rash only DID THE REACTION INVOLVE: Swelling of the face/tongue/throat, SOB, or low BP? Sudden or severe rash/hives, skin peeling, or the inside of the mouth or nose?  Did it require medical treatment?  When did it last happen? If all above answers are "NO", may proceed with cephalosporin use.   . Caffeine Diarrhea, Nausea Only, Palpitations and Other (See Comments)    Headache  . Tetanus Toxoids Swelling and Other (See Comments)    Local reaction     PHYSICAL EXAM:  ECOG Performance status: 1 Blood pressure is 126/53.  Pulse rate is 110.  Respirate is 18.  Temperature 98.6.  Saturations are 96%. Physical Exam Vitals signs reviewed.  Constitutional:      Appearance: Normal appearance.  Cardiovascular:     Rate and Rhythm: Normal rate and regular rhythm.     Heart sounds: Normal heart sounds.  Pulmonary:     Effort: Pulmonary effort is normal.     Breath sounds: Normal breath sounds.  Abdominal:     General: There is no distension.     Palpations: Abdomen is soft. There is no mass.  Musculoskeletal:  General: No swelling.   Skin:    General: Skin is warm.  Neurological:     General: No focal deficit present.     Mental Status: She is alert and oriented to person, place, and time.  Psychiatric:        Mood and Affect: Mood normal.        Behavior: Behavior normal.      LABORATORY DATA:  I have reviewed the labs as listed.  CBC    Component Value Date/Time   WBC 1.0 (LL) 12/31/2018 0827   RBC 3.23 (L) 12/31/2018 0827   HGB 8.6 (L) 12/31/2018 0827   HCT 27.0 (L) 12/31/2018 0827   PLT 102 (L) 12/31/2018 0827   MCV 83.6 12/31/2018 0827   MCH 26.6 12/31/2018 0827   MCHC 31.9 12/31/2018 0827   RDW 18.6 (H) 12/31/2018 0827   LYMPHSABS 0.5 (L) 12/31/2018 0827   MONOABS 0.1 12/31/2018 0827   EOSABS 0.0 12/31/2018 0827   BASOSABS 0.0 12/31/2018 0827   CMP Latest Ref Rng & Units 12/31/2018 12/24/2018 12/16/2018  Glucose 70 - 99 mg/dL 125(H) 135(H) 92  BUN 6 - 20 mg/dL 5(L) 11 12  Creatinine 0.44 - 1.00 mg/dL 0.51 0.60 0.49  Sodium 135 - 145 mmol/L 136 139 134(L)  Potassium 3.5 - 5.1 mmol/L 3.0(L) 3.1(L) 2.9(L)  Chloride 98 - 111 mmol/L 100 103 104  CO2 22 - 32 mmol/L _0 Calcium 8.9 - 10.3 mg/dL 9.2 8.5(L) 7.8(L)  Total Protein 6.5 - 8.1 g/dL 5.7(L) 5.6(L) 4.7(L)  Total Bilirubin 0.3 - 1.2 mg/dL 1.4(H) 1.1 1.0  Alkaline Phos 38 - 126 U/L 149(H) 184(H) 160(H)  AST 15 - 41 U/L 42(H) 50(H) 45(H)  ALT 0 - 44 U/L _1 DIAGNOSTIC IMAGING:  I have independently reviewed the scans and discussed with the patient.   I have reviewed Venita Lick LPN's note and agree with the documentation.  I personally performed a face-to-face visit, made revisions and my assessment and plan is as follows.    ASSESSMENT & PLAN:   Metastatic breast cancer ([place]) 1.  Metastatic breast cancer to the liver: - Biopsy of the liver lesion on 06/26/2018 shows breast cancer, ER/PR positive and HER-2 negative. - PET scan on 07/13/2018 shows a very large centrally necrotic mass involving the right and left  hepatic lobes, and other hypermetabolic lesion in the left lobe.  Multiple hypermetabolic lymph nodes in the upper abdomen and left supraclavicular space.  MRI of the T-spine shows possible small vertebral body metastasis at T5 and T8 on 08/31/2018. - Abemaciclib and Faslodex from 07/02/2018 through 09/07/2018 discontinued for progression. -ERCP and plastic stent in the hepatic duct on 09/22/2018 by Dr. Laural Golden - Paclitaxel weekly 3 weeks on 1 week off started on 09/18/2018, last treatment at 80 mg per metered square on 12/03/2018.  She could not tolerate Xeloda added to the regimen. - She was admitted with neutropenic fever from 12/12/2018 through 12/16/2018. -CT chest PE protocol on 12/13/2018 was negative.  Bilateral pleural effusions, left more than right and ascites seen. -Left thoracentesis on 12/14/2018, 120 mL's of transudate removed. - We reviewed CT scan dated 12/29/2018 which showed decrease in size of the hepatic mass to 10 cm from 19 cm previously.  Necrotic center present.  Liver contour is irregular consistent with cirrhosis. - Today her white count is too low to proceed with therapy.  We will reevaluate her next week. -I will consider  switching paclitaxel to every other week. 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  We will start her on gabapentin 300 mg twice daily.  Other option is gemcitabine.  2.  Ascites: - Pleural fluid on 12/14/2018 from left thoracentesis was a transudate. -Ascites is likely from hepatic metastatic disease.  She complains of feeling full and cannot eat much.  She is not able to lie flat. -Abdominal paracentesis on 12/24/2018, 4 L of fluid removed. -She was reinforced to take Lasix 20 mg daily and spironolactone 25 mg daily.  Will titrate up as blood pressure allows.  3.  Hypokalemia: -She has not taken potassium in the last 2 days.  Today potassium is 3.1.  She reports that she cannot swallow and would rather peripheral IV today.  We will give her 20 mEq IV.  She will resume her  tablets when she goes home.  4.  Constipation: - She will continue stool softener and MiraLAX.  Total time spent is 25 minutes with more than 50% of the time spent face-to-face discussing scan results, further plan and counseling and coordination of care.  Orders placed this encounter:  No orders of the defined types were placed in this encounter.     Derek Jack, MD [place] [phone]

## 2018-12-31 NOTE — Addendum Note (Signed)
Addended by: Charlyne Petrin B on: 12/31/2018 10:38 AM   Modules accepted: Orders

## 2018-12-31 NOTE — Progress Notes (Signed)
Patients port flushed without difficulty.  Good blood return noted before and after flush. No complaints of pain with flush and no bruising or swelling noted at site.  Band aid applied.  VSS with discharge and left ambulatory with no s/s of distress noted.  

## 2018-12-31 NOTE — Patient Instructions (Addendum)
[facility] at [facility] Discharge Instructions  You were seen today by Dr. Katragadda. He went over your recent lab results. He will see you back in  for labs and follow up.   Thank you for choosing [facility] at [facility] to provide your oncology and hematology care.  To afford each patient quality time with our provider, please arrive at least 15 minutes before your scheduled appointment time.   If you have a lab appointment with the Cancer Center please come in thru the  Main Entrance and check in at the main information desk  You need to re-schedule your appointment should you arrive 10 or more minutes late.  We strive to give you quality time with our providers, and arriving late affects you and other patients whose appointments are after yours.  Also, if you no show three or more times for appointments you may be dismissed from the clinic at the providers discretion.     Again, thank you for choosing [facility].  Our hope is that these requests will decrease the amount of time that you wait before being seen by our physicians.       _____________________________________________________________  Should you have questions after your visit to [facility], please contact our office at [phone] between the hours of 8:00 a.m. and 4:30 p.m.  Voicemails left after 4:00 p.m. will not be returned until the following business day.  For prescription refill requests, have your pharmacy contact our office and allow 72 hours.    Cancer Center Support Programs:   > Cancer Support Group  2nd Tuesday of the month 1pm-2pm, Journey Room    

## 2018-12-31 NOTE — Progress Notes (Signed)
Patient to treatment room for follow up and chemotherapy.  Stated neuropathy in feet have worsened but denied falls.  Using a walker for stability.  Dr. [name] notified.  Complaints of constipation but not using her stool softener as directed.  Reviewed management for constipation.  No s/s of distress noted.   Potassium 3.0 today and total bili 1.4 for treatment today.  ANC 0.3 today.  Reviewed labs with Dr. [name] with verbal order hold treatment today and give neupogen and return next week for treatment.    Patient tolerated injection with no complaints voiced.  Site clean and dry with no bruising or swelling noted at site.  Band aid applied.  Vss with discharge and left ambulatory with no s/s of distress noted.

## 2018-12-31 NOTE — Progress Notes (Signed)
CRITICAL VALUE ALERT  Critical Value:  WBC 1.0  Date & Time Notied:  12/31/2018 at [place]  Provider Notified: Dr. [name]  Orders Received/Actions taken:  Hold tx today and give filgrastim

## 2019-01-07 ENCOUNTER — Encounter (HOSPITAL_COMMUNITY): Payer: Self-pay | Admitting: Hematology

## 2019-01-07 ENCOUNTER — Inpatient Hospital Stay (HOSPITAL_COMMUNITY): Payer: 59

## 2019-01-07 ENCOUNTER — Other Ambulatory Visit: Payer: Self-pay

## 2019-01-07 ENCOUNTER — Inpatient Hospital Stay (HOSPITAL_BASED_OUTPATIENT_CLINIC_OR_DEPARTMENT_OTHER): Payer: 59 | Admitting: Hematology

## 2019-01-07 VITALS — BP 103/74 | HR 121 | Temp 97.3°F | Resp 20 | Wt 183.2 lb

## 2019-01-07 VITALS — BP 126/81 | HR 117 | Temp 99.0°F | Resp 18

## 2019-01-07 DIAGNOSIS — K1230 Oral mucositis (ulcerative), unspecified: Secondary | ICD-10-CM

## 2019-01-07 DIAGNOSIS — Z170 Estrogen receptor positive status [ER+]: Secondary | ICD-10-CM | POA: Diagnosis not present

## 2019-01-07 DIAGNOSIS — C50919 Malignant neoplasm of unspecified site of unspecified female breast: Secondary | ICD-10-CM

## 2019-01-07 DIAGNOSIS — C787 Secondary malignant neoplasm of liver and intrahepatic bile duct: Secondary | ICD-10-CM | POA: Diagnosis not present

## 2019-01-07 DIAGNOSIS — G4700 Insomnia, unspecified: Secondary | ICD-10-CM

## 2019-01-07 DIAGNOSIS — K5900 Constipation, unspecified: Secondary | ICD-10-CM

## 2019-01-07 DIAGNOSIS — C50112 Malignant neoplasm of central portion of left female breast: Secondary | ICD-10-CM

## 2019-01-07 DIAGNOSIS — Z5111 Encounter for antineoplastic chemotherapy: Secondary | ICD-10-CM | POA: Diagnosis not present

## 2019-01-07 DIAGNOSIS — Z9221 Personal history of antineoplastic chemotherapy: Secondary | ICD-10-CM

## 2019-01-07 DIAGNOSIS — G629 Polyneuropathy, unspecified: Secondary | ICD-10-CM

## 2019-01-07 LAB — CBC WITH DIFFERENTIAL/PLATELET
Abs Immature Granulocytes: 0.16 10*3/uL — ABNORMAL HIGH (ref 0.00–0.07)
Basophils Absolute: 0.1 10*3/uL (ref 0.0–0.1)
Basophils Relative: 1 %
Eosinophils Absolute: 0.1 10*3/uL (ref 0.0–0.5)
Eosinophils Relative: 1 %
HCT: 30.9 % — ABNORMAL LOW (ref 36.0–46.0)
Hemoglobin: 9.7 g/dL — ABNORMAL LOW (ref 12.0–15.0)
Immature Granulocytes: 3 %
Lymphocytes Relative: 16 %
Lymphs Abs: 1 10*3/uL (ref 0.7–4.0)
MCH: 26.1 pg (ref 26.0–34.0)
MCHC: 31.4 g/dL (ref 30.0–36.0)
MCV: 83.1 fL (ref 80.0–100.0)
Monocytes Absolute: 1.2 10*3/uL — ABNORMAL HIGH (ref 0.1–1.0)
Monocytes Relative: 19 %
Neutro Abs: 3.9 10*3/uL (ref 1.7–7.7)
Neutrophils Relative %: 60 %
Platelets: 173 10*3/uL (ref 150–400)
RBC: 3.72 MIL/uL — ABNORMAL LOW (ref 3.87–5.11)
RDW: 19.1 % — ABNORMAL HIGH (ref 11.5–15.5)
WBC: 6.4 10*3/uL (ref 4.0–10.5)
nRBC: 0 % (ref 0.0–0.2)

## 2019-01-07 LAB — COMPREHENSIVE METABOLIC PANEL
ALT: 24 U/L (ref 0–44)
AST: 46 U/L — ABNORMAL HIGH (ref 15–41)
Albumin: 2.6 g/dL — ABNORMAL LOW (ref 3.5–5.0)
Alkaline Phosphatase: 163 U/L — ABNORMAL HIGH (ref 38–126)
Anion gap: 10 (ref 5–15)
BUN: 5 mg/dL — ABNORMAL LOW (ref 6–20)
CO2: 26 mmol/L (ref 22–32)
Calcium: 8.1 mg/dL — ABNORMAL LOW (ref 8.9–10.3)
Chloride: 100 mmol/L (ref 98–111)
Creatinine, Ser: 0.55 mg/dL (ref 0.44–1.00)
GFR calc Af Amer: >60 mL/min (ref >60)
GFR calc non Af Amer: >60 mL/min (ref >60)
Glucose, Bld: 142 mg/dL — ABNORMAL HIGH (ref 70–99)
Potassium: 2.8 mmol/L — ABNORMAL LOW (ref 3.5–5.1)
Sodium: 136 mmol/L (ref 135–145)
Total Bilirubin: 1.1 mg/dL (ref 0.3–1.2)
Total Protein: 5.8 g/dL — ABNORMAL LOW (ref 6.5–8.1)

## 2019-01-07 MED ORDER — POTASSIUM CHLORIDE CRYS ER 20 MEQ PO TBCR
40.0000 meq | EXTENDED_RELEASE_TABLET | Freq: Once | ORAL | Status: AC
  Administered 2019-01-07: 40 meq via ORAL
  Filled 2019-01-07: qty 2

## 2019-01-07 MED ORDER — SODIUM CHLORIDE 0.9 % IV SOLN
54.0000 mg/m2 | Freq: Once | INTRAVENOUS | Status: AC
  Administered 2019-01-07: 108 mg via INTRAVENOUS
  Filled 2019-01-07: qty 18

## 2019-01-07 MED ORDER — PALONOSETRON HCL INJECTION 0.25 MG/5ML
0.2500 mg | Freq: Once | INTRAVENOUS | Status: AC
  Administered 2019-01-07: 0.25 mg via INTRAVENOUS
  Filled 2019-01-07: qty 5

## 2019-01-07 MED ORDER — SODIUM CHLORIDE 0.9 % IV SOLN
10.0000 mg | Freq: Once | INTRAVENOUS | Status: AC
  Administered 2019-01-07: 11:00:00 10 mg via INTRAVENOUS
  Filled 2019-01-07: qty 10

## 2019-01-07 MED ORDER — DIPHENHYDRAMINE HCL 50 MG/ML IJ SOLN
25.0000 mg | Freq: Once | INTRAMUSCULAR | Status: AC
  Administered 2019-01-07: 25 mg via INTRAVENOUS
  Filled 2019-01-07: qty 1

## 2019-01-07 MED ORDER — FAMOTIDINE IN NACL 20-0.9 MG/50ML-% IV SOLN
20.0000 mg | Freq: Once | INTRAVENOUS | Status: AC
  Administered 2019-01-07: 20 mg via INTRAVENOUS
  Filled 2019-01-07: qty 50

## 2019-01-07 MED ORDER — POTASSIUM CHLORIDE 10 MEQ/100ML IV SOLN
10.0000 meq | INTRAVENOUS | Status: AC
  Administered 2019-01-07 (×2): 10 meq via INTRAVENOUS
  Filled 2019-01-07 (×2): qty 100

## 2019-01-07 MED ORDER — SODIUM CHLORIDE 0.9 % IV SOLN
Freq: Once | INTRAVENOUS | Status: AC
  Administered 2019-01-07: 10:00:00 via INTRAVENOUS

## 2019-01-07 MED ORDER — SODIUM CHLORIDE 0.9% FLUSH
10.0000 mL | INTRAVENOUS | Status: DC | PRN
  Administered 2019-01-07: 10 mL
  Filled 2019-01-07: qty 10

## 2019-01-07 MED ORDER — GABAPENTIN 300 MG PO CAPS: 300.0000 mg | ORAL_CAPSULE | Freq: Every day | ORAL | 1 refills | Status: DC

## 2019-01-07 MED ORDER — HEPARIN SOD (PORK) LOCK FLUSH 100 UNIT/ML IV SOLN
500.0000 [IU] | Freq: Once | INTRAVENOUS | Status: AC | PRN
  Administered 2019-01-07: 500 [IU]

## 2019-01-07 MED ORDER — SCOPOLAMINE 1 MG/3DAYS TD PT72
1.0000 | MEDICATED_PATCH | TRANSDERMAL | Status: DC
  Administered 2019-01-07: 1.5 mg via TRANSDERMAL
  Filled 2019-01-07: qty 1

## 2019-01-07 MED ORDER — PROCHLORPERAZINE MALEATE 10 MG PO TABS: 10.0000 mg | ORAL_TABLET | Freq: Four times a day (QID) | ORAL | 1 refills | Status: DC | PRN

## 2019-01-07 NOTE — Progress Notes (Signed)
[address] [place], [place] [address]   CLINIC:  Medical Oncology/Hematology  PCP:  Doree Albee, MD [place] [address] [phone]   REASON FOR VISIT:  Follow-up for metastatic breast cancer.   BRIEF ONCOLOGIC HISTORY:  Oncology History  Metastatic breast cancer ([place])  07/02/2018 Initial Diagnosis   Metastatic breast cancer ([place])   09/18/2018 -  Chemotherapy   The patient had palonosetron (ALOXI) injection 0.25 mg, 0.25 mg, Intravenous,  Once, 3 of 4 cycles Administration: 0.25 mg (10/01/2018), 0.25 mg (10/09/2018), 0.25 mg (10/23/2018), 0.25 mg (10/30/2018), 0.25 mg (11/12/2018), 0.25 mg (12/03/2018), 0.25 mg (12/24/2018), 0.25 mg (01/07/2019) PACLitaxel (TAXOL) 120 mg in sodium chloride 0.9 % 250 mL chemo infusion (</= 47m/m2), 60 mg/m2 = 120 mg (100 % of original dose 60 mg/m2), Intravenous,  Once, 3 of 4 cycles Dose modification: 60 mg/m2 (original dose 60 mg/m2, Cycle 1, Reason: Change in LFTs), 80 mg/m2 (original dose 60 mg/m2, Cycle 2, Reason: Provider Judgment), 54 mg/m2 (90 % of original dose 60 mg/m2, Cycle 3, Reason: Other (see comments), Comment: hepatic dysfunction) Administration: 120 mg (09/18/2018), 120 mg (10/01/2018), 120 mg (10/23/2018), 120 mg (10/09/2018), 120 mg (10/30/2018), 162 mg (11/12/2018), 162 mg (12/03/2018), 108 mg (12/24/2018)  for chemotherapy treatment.       CANCER STAGING: Cancer Staging No matching staging information was found for the patient.   INTERVAL HISTORY:  Ms. NHansley40y.o. female seen for follow-up of metastatic breast cancer.  Last treatment was on 12/24/2018 with dose reduced paclitaxel.  We held her chemotherapy last week because of neutropenia.  Appetite and energy levels are 50%.  Very mild pain in the stomach area rated as 2 out of 10.  Numbness in the feet has been stable.  Constipation is controlled.  REVIEW OF SYSTEMS:  Review of Systems  Respiratory: Positive for shortness of breath.    Cardiovascular: Positive for leg swelling.  Gastrointestinal: Positive for constipation and nausea.  Neurological: Positive for numbness.  All other systems reviewed and are negative.    PAST MEDICAL/SURGICAL HISTORY:  Past Medical History:  Diagnosis Date  . Anemia   . Anxiety   . Asthma   . Breast cancer ([place]    Left, 2017  . Complication of anesthesia   . Depression   . GERD (gastroesophageal reflux disease)   . Migraine   . OAB (overactive bladder)   . PONV (postoperative nausea and vomiting)   . Seasonal allergies    Past Surgical History:  Procedure Laterality Date  . ABDOMINAL HYSTERECTOMY     breast cancer  . ANKLE RECONSTRUCTION Right 1989  . BILIARY STENT PLACEMENT N/A 09/22/2018   Procedure: BILIARY STENT PLACEMENT;  Surgeon: RRogene Houston MD;  Location: [facility];  Service: Endoscopy;  Laterality: N/A;  . BREAST SURGERY    . COLONOSCOPY WITH PROPOFOL N/A 08/06/2018   Procedure: COLONOSCOPY WITH PROPOFOL;  Surgeon: FDanie Binder MD;  Location: [facility];  Service: Endoscopy;  Laterality: N/A;  1:30pm  . ERCP N/A 09/22/2018   Procedure: ENDOSCOPIC RETROGRADE CHOLANGIOPANCREATOGRAPHY (ERCP);  Surgeon: RRogene Houston MD;  Location: [facility];  Service: Endoscopy;  Laterality: N/A;  . FOREIGN BODY REMOVAL N/A 08/29/2017   from lip  . KNEE SURGERY Right 1989   bone spur  . MASTECTOMY  2017   bil mastectomies  . PORTACATH PLACEMENT Left 12/23/2018   Procedure: INSERTION PORT-A-CATH LEFT SUBCLAVIAN;  Surgeon: JAviva Signs MD;  Location: [facility] ORS;  Service: General;  Laterality: Left;  . SINUSOTOMY       SOCIAL HISTORY:  Social History   Socioeconomic History  . Marital status: Single    Spouse name: Not on file  . Number of children: 1  . Years of education: 13  . Highest education level: Not on file  Occupational History  . Occupation: disabled  Social Needs  . Financial resource strain: Somewhat hard  . Food insecurity    Worry:  Sometimes true    Inability: Sometimes true  . Transportation needs    Medical: No    Non-medical: No  Tobacco Use  . Smoking status: Never Smoker  . Smokeless tobacco: Never Used  Substance and Sexual Activity  . Alcohol use: No  . Drug use: No  . Sexual activity: Not Currently  Lifestyle  . Physical activity    Days per week: 0 days    Minutes per session: 0 min  . Stress: Rather much  Relationships  . Social Herbalist on phone: Once a week    Gets together: Once a week    Attends religious service: 1 to 4 times per year    Active member of club or organization: Yes    Attends meetings of clubs or organizations: Never    Relationship status: Never married  . Intimate partner violence    Fear of current or ex partner: No    Emotionally abused: No    Physically abused: No    Forced sexual activity: No  Other Topics Concern  . Not on file  Social History Narrative   Bachelors degree   Accounting   Lives with daughter [name] who has autism and diabetes   Likes to sew, quilt, crafts, crochet    FAMILY HISTORY:  Family History  Problem Relation Age of Onset  . Arthritis Mother   . COPD Mother   . Depression Mother   . Diabetes Mother   . Kidney disease Father   . Heart disease Father 26  . Drug abuse Father   . Hypertension Father   . Diabetes Daughter   . Hashimoto's thyroiditis Daughter   . Irritable bowel syndrome Daughter   . Autism spectrum disorder Daughter   . Early death Maternal Grandmother        drowned  . Early death Maternal Grandfather   . Heart disease Maternal Grandfather   . Heart disease Paternal Grandfather   . Breast cancer Maternal Aunt   . Breast cancer Cousin   . Lung cancer Maternal Aunt   . Lung cancer Maternal Uncle   . Colon cancer Neg Hx     CURRENT MEDICATIONS:  Outpatient Encounter Medications as of 01/07/2019  Medication Sig  . acetaminophen (TYLENOL) 325 MG tablet Take 2 tablets (650 mg total) by mouth every 6  (six) hours as needed for mild pain, fever or headache (T over 101).  . furosemide (LASIX) 20 MG tablet Take 1 tablet (20 mg total) by mouth daily.  Marland Kitchen lidocaine-prilocaine (EMLA) cream Apply 1 application topically as needed.  . magnesium oxide (MAG-OX) 400 MG tablet TAKE 1 TABLET BY MOUTH EVERY DAY  . oxyCODONE-acetaminophen (PERCOCET) 5-325 MG tablet Take 1 tablet by mouth every 4 (four) hours as needed for moderate pain.  Marland Kitchen PACLitaxel (TAXOL IV) Inject into the vein once a week.   . Polyvinyl Alcohol-Povidone PF (REFRESH) 1.4-0.6 % SOLN Place 1 drop into both eyes as needed (for dry eyes).   Marland Kitchen  potassium chloride SA (K-DUR) 20 MEQ tablet Take 2 tablets (40 mEq total) by mouth daily.  . rizatriptan (MAXALT-MLT) 10 MG disintegrating tablet Take 10 mg by mouth every 2 (two) hours as needed for migraine.   . senna-docusate (SENOKOT-S) 8.6-50 MG tablet Take 2 tablets by mouth at bedtime as needed for mild constipation or moderate constipation.  Marland Kitchen spironolactone (ALDACTONE) 25 MG tablet Take 1 tablet (25 mg total) by mouth daily.  . temazepam (RESTORIL) 15 MG capsule Take 1 capsule (15 mg total) by mouth at bedtime as needed for sleep.  . [DISCONTINUED] prochlorperazine (COMPAZINE) 10 MG tablet Take 1 tablet (10 mg total) by mouth every 6 (six) hours as needed for nausea or vomiting.  Marland Kitchen b complex vitamins capsule Take 1 capsule by mouth daily.  . Cholecalciferol (VITAMIN D3 GUMMIES) 25 MCG (1000 UT) CHEW Chew 2,000-4,000 Units by mouth daily.  Marland Kitchen escitalopram (LEXAPRO) 20 MG tablet Take 1 tablet (20 mg total) by mouth daily. (Patient not taking: Reported on 01/07/2019)  . gabapentin (NEURONTIN) 300 MG capsule Take 1 capsule (300 mg total) by mouth at bedtime.  Marland Kitchen Lysine 1000 MG TABS Take 1,000 mg by mouth daily.  . magic mouthwash w/lidocaine SOLN Take 5 mLs by mouth 4 (four) times daily as needed for mouth pain. (Patient not taking: Reported on 01/07/2019)  . nystatin (MYCOSTATIN) 100000 UNIT/ML  suspension Take 5 mLs (500,000 Units total) by mouth 4 (four) times daily. Swish and gargle - then either spit or swallow (Patient not taking: Reported on 01/07/2019)  . ondansetron (ZOFRAN-ODT) 8 MG disintegrating tablet TAKE 1 TABLET (8 MG TOTAL) BY MOUTH EVERY 8 (EIGHT) HOURS AS NEEDED FOR NAUSEA OR VOMITING. (Patient not taking: Reported on 01/07/2019)  . pantoprazole (PROTONIX) 40 MG tablet TAKE 1 TABLET BY MOUTH EVERY DAY BEFORE BREAKFAST (Patient not taking: No sig reported)  . promethazine (PHENERGAN) 25 MG tablet Take 1 tablet (25 mg total) by mouth every 6 (six) hours as needed for nausea or vomiting. (Patient not taking: Reported on 01/07/2019)   No facility-administered encounter medications on file as of 01/07/2019.     ALLERGIES:  Allergies  Allergen Reactions  . Corn-Containing Products Other (See Comments)    Headache and GI upset  . Salagen [Pilocarpine] Other (See Comments)    Can cause liver failure   . Tape Itching and Other (See Comments)    Depending on the adhesive-blistering occurs  . Amoxicillin Hives and Other (See Comments)    Rash only DID THE REACTION INVOLVE: Swelling of the face/tongue/throat, SOB, or low BP? Sudden or severe rash/hives, skin peeling, or the inside of the mouth or nose?  Did it require medical treatment?  When did it last happen? If all above answers are "NO", may proceed with cephalosporin use.   . Caffeine Diarrhea, Nausea Only, Palpitations and Other (See Comments)    Headache  . Tetanus Toxoids Swelling and Other (See Comments)    Local reaction     PHYSICAL EXAM:  ECOG Performance status: 1 Blood pressure is 126/53.  Pulse rate is 110.  Respirate is 18.  Temperature 98.6.  Saturations are 96%. Physical Exam Vitals signs reviewed.  Constitutional:      Appearance: Normal appearance.  Cardiovascular:     Rate and Rhythm: Normal rate and regular rhythm.     Heart sounds: Normal heart sounds.  Pulmonary:     Effort:  Pulmonary effort is normal.     Breath sounds: Normal breath sounds.  Abdominal:  General: There is no distension.     Palpations: Abdomen is soft. There is no mass.  Musculoskeletal:        General: No swelling.  Skin:    General: Skin is warm.  Neurological:     General: No focal deficit present.     Mental Status: She is alert and oriented to person, place, and time.  Psychiatric:        Mood and Affect: Mood normal.        Behavior: Behavior normal.      LABORATORY DATA:  I have reviewed the labs as listed.  CBC    Component Value Date/Time   WBC 6.4 01/07/2019 0824   RBC 3.72 (L) 01/07/2019 0824   HGB 9.7 (L) 01/07/2019 0824   HCT 30.9 (L) 01/07/2019 0824   PLT 173 01/07/2019 0824   MCV 83.1 01/07/2019 0824   MCH 26.1 01/07/2019 0824   MCHC 31.4 01/07/2019 0824   RDW 19.1 (H) 01/07/2019 0824   LYMPHSABS 1.0 01/07/2019 0824   MONOABS 1.2 (H) 01/07/2019 0824   EOSABS 0.1 01/07/2019 0824   BASOSABS 0.1 01/07/2019 0824   CMP Latest Ref Rng & Units 01/07/2019 12/31/2018 12/24/2018  Glucose 70 - 99 mg/dL 142(H) 125(H) 135(H)  BUN 6 - 20 mg/dL 5(L) 5(L) 11  Creatinine 0.44 - 1.00 mg/dL 0.55 0.51 0.60  Sodium 135 - 145 mmol/L 136 136 139  Potassium 3.5 - 5.1 mmol/L 2.8(L) 3.0(L) 3.1(L)  Chloride 98 - 111 mmol/L 100 100 103  CO2 22 - 32 mmol/L _0 Calcium 8.9 - 10.3 mg/dL 8.1(L) 9.2 8.5(L)  Total Protein 6.5 - 8.1 g/dL 5.8(L) 5.7(L) 5.6(L)  Total Bilirubin 0.3 - 1.2 mg/dL 1.1 1.4(H) 1.1  Alkaline Phos 38 - 126 U/L 163(H) 149(H) 184(H)  AST 15 - 41 U/L 46(H) 42(H) 50(H)  ALT 0 - 44 U/L _1 DIAGNOSTIC IMAGING:  I have independently reviewed the scans and discussed with the patient.   I have reviewed Venita Lick LPN's note and agree with the documentation.  I personally performed a face-to-face visit, made revisions and my assessment and plan is as follows.    ASSESSMENT & PLAN:   Metastatic breast cancer ([place]) 1.  Metastatic breast  cancer to the liver: - Biopsy of the liver lesion on 06/26/2018 shows breast cancer, ER/PR positive and HER-2 negative. - PET scan on 07/13/2018 shows a very large centrally necrotic mass involving the right and left hepatic lobes, and other hypermetabolic lesion in the left lobe.  Multiple hypermetabolic lymph nodes in the upper abdomen and left supraclavicular space.  MRI of the T-spine shows possible small vertebral body metastasis at T5 and T8 on 08/31/2018. - Abemaciclib and Faslodex from 07/02/2018 through 09/07/2018 discontinued for progression. -ERCP and plastic stent in the hepatic duct on 09/22/2018 by Dr. Laural Golden - Paclitaxel weekly 3 weeks on 1 week off started on 09/18/2018, last treatment at 80 mg per metered square on 12/03/2018.  She could not tolerate Xeloda added to the regimen. - She was admitted with neutropenic fever from 12/12/2018 through 12/16/2018. -CT chest PE protocol on 12/13/2018 was negative.  Bilateral pleural effusions, left more than right and ascites seen. -Left thoracentesis on 12/14/2018, 120 mL's of transudate removed. - CT on 12/29/2018 showed decrease in size of the hepatic mass to 10 cm from 19 cm previously.  Necrotic center present.  Liver contour is irregular consistent with cirrhosis. -Today her white  count is normal with normal ANC.  She will proceed with her next cycle of Taxol.  She will continue dose reduction of 54 mg/M squared. -I will switch her to every 2 weeks treatment as her counts are not recovering in 1 week.  She also has gotten a very good response on recent scan. -She wants to try gabapentin for her neuropathy.  We will send a prescription for 300 mg at bedtime.  I will slowly titrate up as needed.  2.  Ascites: - Pleural fluid on 12/14/2018 from left thoracentesis was a transudate. -Ascites is likely from hepatic metastatic disease.  She complains of feeling full and cannot eat much.  She is not able to lie flat. -Abdominal paracentesis on 12/24/2018, 4 L of  fluid removed. -She has started taking Lasix 20 mg daily and spironolactone 25 mg daily since last visit 1 week ago.  3.  Hypokalemia: -Potassium is 2.8 today.  She is not taking potassium tablets on a regular basis. -I have reinforced her to take the potassium 20 mEq daily.  4.  Constipation: - She will continue stool softener and MiraLAX.  Total time spent is 25 minutes with more than 50% of the time spent face-to-face discussing scan results, further plan and counseling and coordination of care.  Orders placed this encounter:  Orders Placed This Encounter  Procedures  . CBC with Differential/Platelet  . Comprehensive metabolic panel  . Cancer antigen 15-3  . Cancer antigen 27.29  . Magnesium      Derek Jack, MD [place] [phone]

## 2019-01-07 NOTE — Patient Instructions (Addendum)
[facility] at [facility] Discharge Instructions  You were seen today by Dr. Katragadda. He went over your recent lab results. He will see you back in 2 weeks for labs and follow up.   Thank you for choosing [facility] at [facility] to provide your oncology and hematology care.  To afford each patient quality time with our provider, please arrive at least 15 minutes before your scheduled appointment time.   If you have a lab appointment with the Cancer Center please come in thru the  Main Entrance and check in at the main information desk  You need to re-schedule your appointment should you arrive 10 or more minutes late.  We strive to give you quality time with our providers, and arriving late affects you and other patients whose appointments are after yours.  Also, if you no show three or more times for appointments you may be dismissed from the clinic at the providers discretion.     Again, thank you for choosing [facility].  Our hope is that these requests will decrease the amount of time that you wait before being seen by our physicians.       _____________________________________________________________  Should you have questions after your visit to [facility], please contact our office at [phone] between the hours of 8:00 a.m. and 4:30 p.m.  Voicemails left after 4:00 p.m. will not be returned until the following business day.  For prescription refill requests, have your pharmacy contact our office and allow 72 hours.    Cancer Center Support Programs:   > Cancer Support Group  2nd Tuesday of the month 1pm-2pm, Journey Room    

## 2019-01-07 NOTE — Assessment & Plan Note (Signed)
1.  Metastatic breast cancer to the liver: - Biopsy of the liver lesion on 06/26/2018 shows breast cancer, ER/PR positive and HER-2 negative. - PET scan on 07/13/2018 shows a very large centrally necrotic mass involving the right and left hepatic lobes, and other hypermetabolic lesion in the left lobe.  Multiple hypermetabolic lymph nodes in the upper abdomen and left supraclavicular space.  MRI of the T-spine shows possible small vertebral body metastasis at T5 and T8 on 08/31/2018. - Abemaciclib and Faslodex from 07/02/2018 through 09/07/2018 discontinued for progression. -ERCP and plastic stent in the hepatic duct on 09/22/2018 by Dr. Laural Golden - Paclitaxel weekly 3 weeks on 1 week off started on 09/18/2018, last treatment at 80 mg per metered square on 12/03/2018.  She could not tolerate Xeloda added to the regimen. - She was admitted with neutropenic fever from 12/12/2018 through 12/16/2018. -CT chest PE protocol on 12/13/2018 was negative.  Bilateral pleural effusions, left more than right and ascites seen. -Left thoracentesis on 12/14/2018, 120 mL's of transudate removed. - CT on 12/29/2018 showed decrease in size of the hepatic mass to 10 cm from 19 cm previously.  Necrotic center present.  Liver contour is irregular consistent with cirrhosis. -Today her white count is normal with normal ANC.  She will proceed with her next cycle of Taxol.  She will continue dose reduction of 54 mg/M squared. -I will switch her to every 2 weeks treatment as her counts are not recovering in 1 week.  She also has gotten a very good response on recent scan. -She wants to try gabapentin for her neuropathy.  We will send a prescription for 300 mg at bedtime.  I will slowly titrate up as needed.  2.  Ascites: - Pleural fluid on 12/14/2018 from left thoracentesis was a transudate. -Ascites is likely from hepatic metastatic disease.  She complains of feeling full and cannot eat much.  She is not able to lie flat. -Abdominal paracentesis  on 12/24/2018, 4 L of fluid removed. -She has started taking Lasix 20 mg daily and spironolactone 25 mg daily since last visit 1 week ago.  3.  Hypokalemia: -Potassium is 2.8 today.  She is not taking potassium tablets on a regular basis. -I have reinforced her to take the potassium 20 mEq daily.  4.  Constipation: - She will continue stool softener and MiraLAX.

## 2019-01-07 NOTE — Patient Instructions (Signed)
[facility] Discharge Instructions for Patients Receiving Chemotherapy   Beginning January 23rd 2017 lab work for the [facility] will be done in the  Main lab at [facility] on 1st floor. If you have a lab appointment with the [facility] please come in thru the  Main Entrance and check in at the main information desk   Today you received the following chemotherapy agents Taxol as well as Potassium infusions. Follow-up as scheduled. Call clinic for any questions or concerns  To help prevent nausea and vomiting after your treatment, we encourage you to take your nausea medication   If you develop nausea and vomiting, or diarrhea that is not controlled by your medication, call the clinic.  The clinic phone number is [phone]. Office hours are Monday-Friday 8:30am-5:00pm.  BELOW ARE SYMPTOMS THAT SHOULD BE REPORTED IMMEDIATELY:  *FEVER GREATER THAN 101.0 F  *CHILLS WITH OR WITHOUT FEVER  NAUSEA AND VOMITING THAT IS NOT CONTROLLED WITH YOUR NAUSEA MEDICATION  *UNUSUAL SHORTNESS OF BREATH  *UNUSUAL BRUISING OR BLEEDING  TENDERNESS IN MOUTH AND THROAT WITH OR WITHOUT PRESENCE OF ULCERS  *URINARY PROBLEMS  *BOWEL PROBLEMS  UNUSUAL RASH Items with * indicate a potential emergency and should be followed up as soon as possible. If you have an emergency after office hours please contact your primary care physician or go to the nearest emergency department.  Please call the clinic during office hours if you have any questions or concerns.   You may also contact the Patient Navigator at [phone] should you have any questions or need assistance in obtaining follow up care.      Resources For Cancer Patients and their Caregivers ? American Cancer Society: Can assist with transportation, wigs, general needs, runs Look Good Feel Better.        7704646351 ? Cancer Care: Provides financial assistance, online support groups, medication/co-pay  assistance.  1-800-813-HOPE 906-078-1330) ? [facility] Assists [place] cancer patients and their families through emotional , educational and financial support.  718-420-6339 ? [place] DSS Where to apply for food stamps, Medicaid and utility assistance. 610-711-2008 ? [facility]: Transportation to medical appointments. 906 139 4259 ? Social Security Administration: May apply for disability if have a Stage IV cancer. 650-881-3820 309-112-8536 ? [employer], Disability and Transit Services: Assists with nutrition, care and transit needs. (413)559-1532

## 2019-01-07 NOTE — Assessment & Plan Note (Signed)
1.  Metastatic breast cancer to the liver: - Biopsy of the liver lesion on 06/26/2018 shows breast cancer, ER/PR positive and HER-2 negative. - PET scan on 07/13/2018 shows a very large centrally necrotic mass involving the right and left hepatic lobes, and other hypermetabolic lesion in the left lobe.  Multiple hypermetabolic lymph nodes in the upper abdomen and left supraclavicular space.  MRI of the T-spine shows possible small vertebral body metastasis at T5 and T8 on 08/31/2018. - Abemaciclib and Faslodex from 07/02/2018 through 09/07/2018 discontinued for progression. -ERCP and plastic stent in the hepatic duct on 09/22/2018 by Dr. Laural Golden - Paclitaxel weekly 3 weeks on 1 week off started on 09/18/2018, last treatment at 80 mg per metered square on 12/03/2018.  She could not tolerate Xeloda added to the regimen. - She was admitted with neutropenic fever from 12/12/2018 through 12/16/2018. -CT chest PE protocol on 12/13/2018 was negative.  Bilateral pleural effusions, left more than right and ascites seen. -Left thoracentesis on 12/14/2018, 120 mL's of transudate removed. - We reviewed CT scan dated 12/29/2018 which showed decrease in size of the hepatic mass to 10 cm from 19 cm previously.  Necrotic center present.  Liver contour is irregular consistent with cirrhosis. - Today her white count is too low to proceed with therapy.  We will reevaluate her next week. -I will consider switching paclitaxel to every other week. 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  We will start her on gabapentin 300 mg twice daily.  Other option is gemcitabine.  2.  Ascites: - Pleural fluid on 12/14/2018 from left thoracentesis was a transudate. -Ascites is likely from hepatic metastatic disease.  She complains of feeling full and cannot eat much.  She is not able to lie flat. -Abdominal paracentesis on 12/24/2018, 4 L of fluid removed. -She was reinforced to take Lasix 20 mg daily and spironolactone 25 mg daily.  Will titrate up as blood  pressure allows.  3.  Hypokalemia: -She has not taken potassium in the last 2 days.  Today potassium is 3.1.  She reports that she cannot swallow and would rather peripheral IV today.  We will give her 20 mEq IV.  She will resume her tablets when she goes home.  4.  Constipation: - She will continue stool softener and MiraLAX.

## 2019-01-07 NOTE — Progress Notes (Signed)
0930 Labs and vitals reviewed with and pt seen by Dr. [name] and pt approved for Taxol infusion today as well as K+ 20 meq IV and 40 meq PO with scopolamine patch also added per MD                                                                                      Pamela Long tolerated chemo tx and potassium infusions well without complaints or incident. VSS upon discharge. Pt discharged self ambulatory in satisfactory condition

## 2019-01-07 NOTE — Patient Instructions (Signed)
[facility] at [facility] Discharge Instructions  Labs drawn from portacath today   Thank you for choosing [facility] at [facility] to provide your oncology and hematology care.  To afford each patient quality time with our provider, please arrive at least 15 minutes before your scheduled appointment time.   If you have a lab appointment with the Cancer Center please come in thru the  Main Entrance and check in at the main information desk  You need to re-schedule your appointment should you arrive 10 or more minutes late.  We strive to give you quality time with our providers, and arriving late affects you and other patients whose appointments are after yours.  Also, if you no show three or more times for appointments you may be dismissed from the clinic at the providers discretion.     Again, thank you for choosing [facility].  Our hope is that these requests will decrease the amount of time that you wait before being seen by our physicians.       _____________________________________________________________  Should you have questions after your visit to [facility], please contact our office at [phone] between the hours of 8:00 a.m. and 4:30 p.m.  Voicemails left after 4:00 p.m. will not be returned until the following business day.  For prescription refill requests, have your pharmacy contact our office and allow 72 hours.    Cancer Center Support Programs:   > Cancer Support Group  2nd Tuesday of the month 1pm-2pm, Journey Room   

## 2019-01-16 ENCOUNTER — Other Ambulatory Visit (HOSPITAL_COMMUNITY): Payer: Self-pay | Admitting: Hematology

## 2019-01-16 DIAGNOSIS — R188 Other ascites: Secondary | ICD-10-CM

## 2019-01-16 DIAGNOSIS — C50919 Malignant neoplasm of unspecified site of unspecified female breast: Secondary | ICD-10-CM

## 2019-01-18 ENCOUNTER — Encounter (HOSPITAL_COMMUNITY): Payer: Self-pay

## 2019-01-18 ENCOUNTER — Other Ambulatory Visit (HOSPITAL_COMMUNITY): Payer: Self-pay | Admitting: *Deleted

## 2019-01-18 MED ORDER — SCOPOLAMINE 1 MG/3DAYS TD PT72: 1.0000 | MEDICATED_PATCH | TRANSDERMAL | 12 refills | Status: DC

## 2019-01-18 NOTE — Progress Notes (Signed)
Patient wrote in to ask for scopolamine patches because those are what is helping with her nausea.  I spoke with Dr. [name] and he is okay sending in a prescription for them.  Patient is aware of instructions on how to use the patch.  Sent to her pharmacy.

## 2019-01-20 ENCOUNTER — Other Ambulatory Visit (HOSPITAL_COMMUNITY): Payer: Self-pay | Admitting: Hematology

## 2019-01-20 DIAGNOSIS — R188 Other ascites: Secondary | ICD-10-CM

## 2019-01-20 DIAGNOSIS — C50919 Malignant neoplasm of unspecified site of unspecified female breast: Secondary | ICD-10-CM

## 2019-01-21 ENCOUNTER — Inpatient Hospital Stay (HOSPITAL_COMMUNITY): Payer: 59 | Attending: Hematology

## 2019-01-21 ENCOUNTER — Inpatient Hospital Stay (HOSPITAL_BASED_OUTPATIENT_CLINIC_OR_DEPARTMENT_OTHER): Payer: 59 | Admitting: Hematology

## 2019-01-21 ENCOUNTER — Other Ambulatory Visit: Payer: Self-pay

## 2019-01-21 ENCOUNTER — Encounter (HOSPITAL_COMMUNITY): Payer: Self-pay | Admitting: Hematology

## 2019-01-21 ENCOUNTER — Inpatient Hospital Stay (HOSPITAL_COMMUNITY): Payer: 59

## 2019-01-21 DIAGNOSIS — C50112 Malignant neoplasm of central portion of left female breast: Secondary | ICD-10-CM

## 2019-01-21 DIAGNOSIS — R0602 Shortness of breath: Secondary | ICD-10-CM | POA: Diagnosis not present

## 2019-01-21 DIAGNOSIS — Z170 Estrogen receptor positive status [ER+]: Secondary | ICD-10-CM | POA: Diagnosis not present

## 2019-01-21 DIAGNOSIS — K5900 Constipation, unspecified: Secondary | ICD-10-CM | POA: Diagnosis not present

## 2019-01-21 DIAGNOSIS — Z79899 Other long term (current) drug therapy: Secondary | ICD-10-CM | POA: Diagnosis not present

## 2019-01-21 DIAGNOSIS — G620 Drug-induced polyneuropathy: Secondary | ICD-10-CM | POA: Insufficient documentation

## 2019-01-21 DIAGNOSIS — R600 Localized edema: Secondary | ICD-10-CM | POA: Insufficient documentation

## 2019-01-21 DIAGNOSIS — C787 Secondary malignant neoplasm of liver and intrahepatic bile duct: Secondary | ICD-10-CM | POA: Diagnosis not present

## 2019-01-21 DIAGNOSIS — C50919 Malignant neoplasm of unspecified site of unspecified female breast: Secondary | ICD-10-CM

## 2019-01-21 DIAGNOSIS — Z5111 Encounter for antineoplastic chemotherapy: Secondary | ICD-10-CM | POA: Diagnosis not present

## 2019-01-21 DIAGNOSIS — T451X5A Adverse effect of antineoplastic and immunosuppressive drugs, initial encounter: Secondary | ICD-10-CM | POA: Diagnosis not present

## 2019-01-21 DIAGNOSIS — D701 Agranulocytosis secondary to cancer chemotherapy: Secondary | ICD-10-CM | POA: Insufficient documentation

## 2019-01-21 DIAGNOSIS — R110 Nausea: Secondary | ICD-10-CM | POA: Insufficient documentation

## 2019-01-21 DIAGNOSIS — R188 Other ascites: Secondary | ICD-10-CM | POA: Diagnosis not present

## 2019-01-21 DIAGNOSIS — R200 Anesthesia of skin: Secondary | ICD-10-CM | POA: Diagnosis not present

## 2019-01-21 LAB — COMPREHENSIVE METABOLIC PANEL
ALT: 18 U/L (ref 0–44)
AST: 30 U/L (ref 15–41)
Albumin: 2.8 g/dL — ABNORMAL LOW (ref 3.5–5.0)
Alkaline Phosphatase: 114 U/L (ref 38–126)
Anion gap: 6 (ref 5–15)
BUN: 10 mg/dL (ref 6–20)
CO2: 28 mmol/L (ref 22–32)
Calcium: 8.6 mg/dL — ABNORMAL LOW (ref 8.9–10.3)
Chloride: 100 mmol/L (ref 98–111)
Creatinine, Ser: 0.63 mg/dL (ref 0.44–1.00)
GFR calc Af Amer: >60 mL/min (ref >60)
GFR calc non Af Amer: >60 mL/min (ref >60)
Glucose, Bld: 147 mg/dL — ABNORMAL HIGH (ref 70–99)
Potassium: 3.3 mmol/L — ABNORMAL LOW (ref 3.5–5.1)
Sodium: 134 mmol/L — ABNORMAL LOW (ref 135–145)
Total Bilirubin: 1.6 mg/dL — ABNORMAL HIGH (ref 0.3–1.2)
Total Protein: 6.3 g/dL — ABNORMAL LOW (ref 6.5–8.1)

## 2019-01-21 LAB — CBC WITH DIFFERENTIAL/PLATELET
Abs Immature Granulocytes: 0.01 10*3/uL (ref 0.00–0.07)
Basophils Absolute: 0.1 10*3/uL (ref 0.0–0.1)
Basophils Relative: 2 %
Eosinophils Absolute: 0 10*3/uL (ref 0.0–0.5)
Eosinophils Relative: 1 %
HCT: 31 % — ABNORMAL LOW (ref 36.0–46.0)
Hemoglobin: 9.7 g/dL — ABNORMAL LOW (ref 12.0–15.0)
Immature Granulocytes: 0 %
Lymphocytes Relative: 21 %
Lymphs Abs: 0.7 10*3/uL (ref 0.7–4.0)
MCH: 25.9 pg — ABNORMAL LOW (ref 26.0–34.0)
MCHC: 31.3 g/dL (ref 30.0–36.0)
MCV: 82.7 fL (ref 80.0–100.0)
Monocytes Absolute: 1.4 10*3/uL — ABNORMAL HIGH (ref 0.1–1.0)
Monocytes Relative: 42 %
Neutro Abs: 1.1 10*3/uL — ABNORMAL LOW (ref 1.7–7.7)
Neutrophils Relative %: 34 %
Platelets: 203 10*3/uL (ref 150–400)
RBC: 3.75 MIL/uL — ABNORMAL LOW (ref 3.87–5.11)
RDW: 18.6 % — ABNORMAL HIGH (ref 11.5–15.5)
WBC: 3.2 10*3/uL — ABNORMAL LOW (ref 4.0–10.5)
nRBC: 0 % (ref 0.0–0.2)

## 2019-01-21 LAB — MAGNESIUM: Magnesium: 1.5 mg/dL — ABNORMAL LOW (ref 1.7–2.4)

## 2019-01-21 MED ORDER — MAGNESIUM SULFATE 2 GM/50ML IV SOLN
2.0000 g | Freq: Once | INTRAVENOUS | Status: AC
  Administered 2019-01-21: 2 g via INTRAVENOUS
  Filled 2019-01-21: qty 50

## 2019-01-21 MED ORDER — FILGRASTIM-SNDZ 480 MCG/0.8ML IJ SOSY
480.0000 ug | PREFILLED_SYRINGE | Freq: Once | INTRAMUSCULAR | Status: AC
  Administered 2019-01-21: 480 ug via SUBCUTANEOUS
  Filled 2019-01-21: qty 0.8

## 2019-01-21 MED ORDER — SODIUM CHLORIDE 0.9% FLUSH
10.0000 mL | Freq: Once | INTRAVENOUS | Status: AC | PRN
  Administered 2019-01-21: 10 mL

## 2019-01-21 MED ORDER — ONDANSETRON 8 MG PO TBDP: 8.0000 mg | ORAL_TABLET | Freq: Three times a day (TID) | ORAL | 1 refills | Status: DC | PRN

## 2019-01-21 MED ORDER — SODIUM CHLORIDE 0.9 % IV SOLN
Freq: Once | INTRAVENOUS | Status: AC
  Administered 2019-01-21: 11:00:00 via INTRAVENOUS

## 2019-01-21 MED ORDER — HEPARIN SOD (PORK) LOCK FLUSH 100 UNIT/ML IV SOLN
500.0000 [IU] | Freq: Once | INTRAVENOUS | Status: AC | PRN
  Administered 2019-01-21: 500 [IU]

## 2019-01-21 NOTE — Progress Notes (Signed)
Magnesium 1.5 with Potassium 3.3 today.  ANC 1.1 and total bili 1.6 for oncology follow up and treatment.    Treatment held today.  Patient to receive magnesium 2gms, normal saline 500 ml, and Neupogen today verbal order Dr. [name].  Patient to return tomorrow for treatment.    Patient tolerated hydration with no complaints voiced.  Good blood return noted.  Dressing intact and reinforced.  No bruising or swelling noted at site.  Patient left ambulatory with no s/s of distress noted.

## 2019-01-21 NOTE — Patient Instructions (Addendum)
[facility] at [facility] Discharge Instructions  You were seen today by Dr. Katragadda. He went over your recent lab results. He will see you back in 2 weeks for labs and follow up.   Thank you for choosing [facility] at [facility] to provide your oncology and hematology care.  To afford each patient quality time with our provider, please arrive at least 15 minutes before your scheduled appointment time.   If you have a lab appointment with the Cancer Center please come in thru the  Main Entrance and check in at the main information desk  You need to re-schedule your appointment should you arrive 10 or more minutes late.  We strive to give you quality time with our providers, and arriving late affects you and other patients whose appointments are after yours.  Also, if you no show three or more times for appointments you may be dismissed from the clinic at the providers discretion.     Again, thank you for choosing [facility].  Our hope is that these requests will decrease the amount of time that you wait before being seen by our physicians.       _____________________________________________________________  Should you have questions after your visit to [facility], please contact our office at [phone] between the hours of 8:00 a.m. and 4:30 p.m.  Voicemails left after 4:00 p.m. will not be returned until the following business day.  For prescription refill requests, have your pharmacy contact our office and allow 72 hours.    Cancer Center Support Programs:   > Cancer Support Group  2nd Tuesday of the month 1pm-2pm, Journey Room    

## 2019-01-21 NOTE — Progress Notes (Signed)
[place] [place], [place] [address]   CLINIC:  Medical Oncology/Hematology  PCP:  Doree Albee, MD [address] [address] [phone]   REASON FOR VISIT:  Follow-up for metastatic breast cancer.   BRIEF ONCOLOGIC HISTORY:  Oncology History  Metastatic breast cancer ([place])  07/02/2018 Initial Diagnosis   Metastatic breast cancer ([place])   09/18/2018 -  Chemotherapy   The patient had palonosetron (ALOXI) injection 0.25 mg, 0.25 mg, Intravenous,  Once, 3 of 4 cycles Administration: 0.25 mg (10/01/2018), 0.25 mg (10/09/2018), 0.25 mg (10/23/2018), 0.25 mg (10/30/2018), 0.25 mg (11/12/2018), 0.25 mg (12/03/2018), 0.25 mg (12/24/2018), 0.25 mg (01/07/2019) PACLitaxel (TAXOL) 120 mg in sodium chloride 0.9 % 250 mL chemo infusion (</= 28m/m2), 60 mg/m2 = 120 mg (100 % of original dose 60 mg/m2), Intravenous,  Once, 3 of 4 cycles Dose modification: 60 mg/m2 (original dose 60 mg/m2, Cycle 1, Reason: Change in LFTs), 80 mg/m2 (original dose 60 mg/m2, Cycle 2, Reason: Provider Judgment), 54 mg/m2 (90 % of original dose 60 mg/m2, Cycle 3, Reason: Other (see comments), Comment: hepatic dysfunction) Administration: 120 mg (09/18/2018), 120 mg (10/01/2018), 120 mg (10/23/2018), 120 mg (10/09/2018), 120 mg (10/30/2018), 162 mg (11/12/2018), 162 mg (12/03/2018), 108 mg (12/24/2018), 108 mg (01/07/2019)  for chemotherapy treatment.       CANCER STAGING: Cancer Staging No matching staging information was found for the patient.   INTERVAL HISTORY:  [name]89y.o. female seen for follow-up of metastatic breast cancer to the liver.  Last treatment was on 01/07/2019 with dose reduced paclitaxel.  Denies any major tingling or numbness extremities.  She is taking gabapentin as needed at nighttime which is helping.  She reports occasional nausea.  She is using scopolamine patch.  She is taking Compazine 1 time in the mornings.  Appetite is 25%.  Energy levels are low.   Shortness of breath with exertion present.  Feet swelling is stable.  Occasional constipation is present.  REVIEW OF SYSTEMS:  Review of Systems  Respiratory: Positive for shortness of breath.   Cardiovascular: Positive for leg swelling.  Gastrointestinal: Positive for constipation and nausea.  Neurological: Positive for numbness.  All other systems reviewed and are negative.    PAST MEDICAL/SURGICAL HISTORY:  Past Medical History:  Diagnosis Date  . Anemia   . Anxiety   . Asthma   . Breast cancer ([place]    Left, 2017  . Complication of anesthesia   . Depression   . GERD (gastroesophageal reflux disease)   . Migraine   . OAB (overactive bladder)   . PONV (postoperative nausea and vomiting)   . Seasonal allergies    Past Surgical History:  Procedure Laterality Date  . ABDOMINAL HYSTERECTOMY     breast cancer  . ANKLE RECONSTRUCTION Right 1989  . BILIARY STENT PLACEMENT N/A 09/22/2018   Procedure: BILIARY STENT PLACEMENT;  Surgeon: RRogene Houston MD;  Location: [facility];  Service: Endoscopy;  Laterality: N/A;  . BREAST SURGERY    . COLONOSCOPY WITH PROPOFOL N/A 08/06/2018   Procedure: COLONOSCOPY WITH PROPOFOL;  Surgeon: FDanie Binder MD;  Location: [facility] END[facility] SU[facility];  Service: Endoscopy;  Laterality: N/A;  1:30pm  . ERCP N/A 09/22/2018   Procedure: ENDOSCOPIC RETROGRADE CHOLANGIOPANCREATOGRAPHY (ERCP);  Surgeon: RRogene Houston MD;  Location: [facility];  Service: Endoscopy;  Laterality: N/A;  . FOREIGN BODY REMOVAL N/A 08/29/2017   from lip  . KNEE SURGERY Right 1989  bone spur  . MASTECTOMY  2017   bil mastectomies  . PORTACATH PLACEMENT Left 12/23/2018   Procedure: INSERTION PORT-A-CATH LEFT SUBCLAVIAN;  Surgeon: Aviva Signs, MD;  Location: [facility] ORS;  Service: General;  Laterality: Left;  . SINUSOTOMY       SOCIAL HISTORY:  Social History   Socioeconomic History  . Marital status: Single    Spouse name: Not on file  . Number of children: 1  . Years  of education: 33  . Highest education level: Not on file  Occupational History  . Occupation: disabled  Social Needs  . Financial resource strain: Somewhat hard  . Food insecurity    Worry: Sometimes true    Inability: Sometimes true  . Transportation needs    Medical: No    Non-medical: No  Tobacco Use  . Smoking status: Never Smoker  . Smokeless tobacco: Never Used  Substance and Sexual Activity  . Alcohol use: No  . Drug use: No  . Sexual activity: Not Currently  Lifestyle  . Physical activity    Days per week: 0 days    Minutes per session: 0 min  . Stress: Rather much  Relationships  . Social Herbalist on phone: Once a week    Gets together: Once a week    Attends religious service: 1 to 4 times per year    Active member of club or organization: Yes    Attends meetings of clubs or organizations: Never    Relationship status: Never married  . Intimate partner violence    Fear of current or ex partner: No    Emotionally abused: No    Physically abused: No    Forced sexual activity: No  Other Topics Concern  . Not on file  Social History Narrative   Bachelors degree   Accounting   Lives with daughter [name] who has autism and diabetes   Likes to sew, quilt, crafts, crochet    FAMILY HISTORY:  Family History  Problem Relation Age of Onset  . Arthritis Mother   . COPD Mother   . Depression Mother   . Diabetes Mother   . Kidney disease Father   . Heart disease Father 72  . Drug abuse Father   . Hypertension Father   . Diabetes Daughter   . Hashimoto's thyroiditis Daughter   . Irritable bowel syndrome Daughter   . Autism spectrum disorder Daughter   . Early death Maternal Grandmother        drowned  . Early death Maternal Grandfather   . Heart disease Maternal Grandfather   . Heart disease Paternal Grandfather   . Breast cancer Maternal Aunt   . Breast cancer Cousin   . Lung cancer Maternal Aunt   . Lung cancer Maternal Uncle   . Colon  cancer Neg Hx     CURRENT MEDICATIONS:  Outpatient Encounter Medications as of 01/21/2019  Medication Sig  . acetaminophen (TYLENOL) 325 MG tablet Take 2 tablets (650 mg total) by mouth every 6 (six) hours as needed for mild pain, fever or headache (T over 101).  Marland Kitchen b complex vitamins capsule Take 1 capsule by mouth daily.  . Cholecalciferol (VITAMIN D3 GUMMIES) 25 MCG (1000 UT) CHEW Chew 2,000-4,000 Units by mouth daily.  . furosemide (LASIX) 20 MG tablet TAKE 1 TABLET BY MOUTH EVERY DAY  . gabapentin (NEURONTIN) 300 MG capsule Take 1 capsule (300 mg total) by mouth at bedtime.  . lidocaine-prilocaine (EMLA) cream Apply 1  application topically as needed.  Marland Kitchen Lysine 1000 MG TABS Take 1,000 mg by mouth daily.  . magnesium oxide (MAG-OX) 400 MG tablet TAKE 1 TABLET BY MOUTH EVERY DAY  . oxyCODONE-acetaminophen (PERCOCET) 5-325 MG tablet Take 1 tablet by mouth every 4 (four) hours as needed for moderate pain.  Marland Kitchen PACLitaxel (TAXOL IV) Inject into the vein once a week.   . pantoprazole (PROTONIX) 40 MG tablet TAKE 1 TABLET BY MOUTH EVERY DAY BEFORE BREAKFAST  . Polyvinyl Alcohol-Povidone PF (REFRESH) 1.4-0.6 % SOLN Place 1 drop into both eyes as needed (for dry eyes).   . potassium chloride SA (K-DUR) 20 MEQ tablet Take 2 tablets (40 mEq total) by mouth daily.  . prochlorperazine (COMPAZINE) 10 MG tablet Take 1 tablet (10 mg total) by mouth every 6 (six) hours as needed for nausea or vomiting.  . promethazine (PHENERGAN) 25 MG tablet Take 1 tablet (25 mg total) by mouth every 6 (six) hours as needed for nausea or vomiting.  . rizatriptan (MAXALT-MLT) 10 MG disintegrating tablet Take 10 mg by mouth every 2 (two) hours as needed for migraine.   Marland Kitchen scopolamine (TRANSDERM-SCOP) 1 MG/3DAYS Place 1 patch (1.5 mg total) onto the skin every 3 (three) days.  Marland Kitchen senna-docusate (SENOKOT-S) 8.6-50 MG tablet Take 2 tablets by mouth at bedtime as needed for mild constipation or moderate constipation.  Marland Kitchen  spironolactone (ALDACTONE) 25 MG tablet TAKE 1 TABLET BY MOUTH EVERY DAY  . temazepam (RESTORIL) 15 MG capsule Take 1 capsule (15 mg total) by mouth at bedtime as needed for sleep.  Marland Kitchen escitalopram (LEXAPRO) 20 MG tablet Take 1 tablet (20 mg total) by mouth daily. (Patient not taking: Reported on 01/07/2019)  . magic mouthwash w/lidocaine SOLN Take 5 mLs by mouth 4 (four) times daily as needed for mouth pain. (Patient not taking: Reported on 01/07/2019)  . nystatin (MYCOSTATIN) 100000 UNIT/ML suspension Take 5 mLs (500,000 Units total) by mouth 4 (four) times daily. Swish and gargle - then either spit or swallow (Patient not taking: Reported on 01/07/2019)  . ondansetron (ZOFRAN-ODT) 8 MG disintegrating tablet Take 1 tablet (8 mg total) by mouth every 8 (eight) hours as needed for nausea or vomiting.  . [DISCONTINUED] ondansetron (ZOFRAN-ODT) 8 MG disintegrating tablet TAKE 1 TABLET (8 MG TOTAL) BY MOUTH EVERY 8 (EIGHT) HOURS AS NEEDED FOR NAUSEA OR VOMITING. (Patient not taking: Reported on 01/07/2019)   No facility-administered encounter medications on file as of 01/21/2019.     ALLERGIES:  Allergies  Allergen Reactions  . Corn-Containing Products Other (See Comments)    Headache and GI upset  . Salagen [Pilocarpine] Other (See Comments)    Can cause liver failure   . Tape Itching and Other (See Comments)    Depending on the adhesive-blistering occurs  . Amoxicillin Hives and Other (See Comments)    Rash only DID THE REACTION INVOLVE: Swelling of the face/tongue/throat, SOB, or low BP? Sudden or severe rash/hives, skin peeling, or the inside of the mouth or nose?  Did it require medical treatment?  When did it last happen? If all above answers are "NO", may proceed with cephalosporin use.   . Caffeine Diarrhea, Nausea Only, Palpitations and Other (See Comments)    Headache  . Tetanus Toxoids Swelling and Other (See Comments)    Local reaction     PHYSICAL EXAM:  ECOG Performance  status: 1 Blood pressure is 126/53.  Pulse rate is 110.  Respirate is 18.  Temperature 98.6.  Saturations are 96%.  Physical Exam Vitals signs reviewed.  Constitutional:      Appearance: Normal appearance.  Cardiovascular:     Rate and Rhythm: Normal rate and regular rhythm.     Heart sounds: Normal heart sounds.  Pulmonary:     Effort: Pulmonary effort is normal.     Breath sounds: Normal breath sounds.  Abdominal:     General: There is no distension.     Palpations: Abdomen is soft. There is no mass.  Musculoskeletal:        General: No swelling.  Skin:    General: Skin is warm.  Neurological:     General: No focal deficit present.     Mental Status: She is alert and oriented to person, place, and time.  Psychiatric:        Mood and Affect: Mood normal.        Behavior: Behavior normal.      LABORATORY DATA:  I have reviewed the labs as listed.  CBC    Component Value Date/Time   WBC 3.2 (L) 01/21/2019 0844   RBC 3.75 (L) 01/21/2019 0844   HGB 9.7 (L) 01/21/2019 0844   HCT 31.0 (L) 01/21/2019 0844   PLT 203 01/21/2019 0844   MCV 82.7 01/21/2019 0844   MCH 25.9 (L) 01/21/2019 0844   MCHC 31.3 01/21/2019 0844   RDW 18.6 (H) 01/21/2019 0844   LYMPHSABS 0.7 01/21/2019 0844   MONOABS 1.4 (H) 01/21/2019 0844   EOSABS 0.0 01/21/2019 0844   BASOSABS 0.1 01/21/2019 0844   CMP Latest Ref Rng & Units 01/21/2019 01/07/2019 12/31/2018  Glucose 70 - 99 mg/dL 147(H) 142(H) 125(H)  BUN 6 - 20 mg/dL 10 5(L) 5(L)  Creatinine 0.44 - 1.00 mg/dL 0.63 0.55 0.51  Sodium 135 - 145 mmol/L 134(L) 136 136  Potassium 3.5 - 5.1 mmol/L 3.3(L) 2.8(L) 3.0(L)  Chloride 98 - 111 mmol/L 100 100 100  CO2 22 - 32 mmol/L '28 26 27  ' Calcium 8.9 - 10.3 mg/dL 8.6(L) 8.1(L) 9.2  Total Protein 6.5 - 8.1 g/dL 6.3(L) 5.8(L) 5.7(L)  Total Bilirubin 0.3 - 1.2 mg/dL 1.6(H) 1.1 1.4(H)  Alkaline Phos 38 - 126 U/L 114 163(H) 149(H)  AST 15 - 41 U/L 30 46(H) 42(H)  ALT 0 - 44 U/L '18 24 23        ' DIAGNOSTIC IMAGING:  I have independently reviewed the scans and discussed with the patient.   I have reviewed Venita Lick LPN's note and agree with the documentation.  I personally performed a face-to-face visit, made revisions and my assessment and plan is as follows.    ASSESSMENT & PLAN:   Malignant neoplasm of central portion of left breast in female, estrogen receptor positive ([place]) 1.  Metastatic breast cancer to the liver, ER/PR positive and HER-2 negative: - Abemaciclib and Faslodex from 07/02/2018 through 09/07/2018 with progression. - ERCP and plastic stent in hepatic duct on 09/22/2018 by Dr. Laural Golden. - Paclitaxel 60 mg per metered square 3 weeks on 1 week off started on 09/18/2018. - She could not tolerate Xeloda when it was added to the Taxol. - She could not tolerate dose increase in Taxol with mucositis.  Because of cytopenias, we have changed her regimen to every 2 weeks on 12/24/2018. - Last cycle was on 01/07/2019.  We reviewed blood work today.  ANC was mildly low.  We will hold her treatment today. -We will give her Neupogen today.  We will repeat her counts tomorrow and proceed with her next treatment.  2.  Hypomagnesemia: -She is not taking magnesium pills.  Magnesium today is 1.5.  She will receive 2 g of IV.  3.  Ascites: - This is from hepatic metastatic disease.  She is taking Lasix 20 mg and spironolactone 25 mg daily.  3.  Neuropathy: - She had some burning sensation on and off in the feet.  We started her on gabapentin 300 mg which she takes at bedtime which is helping.  4.  Nausea: -She has on and off nausea.  She is using scopolamine patch.  She has been taking Compazine twice daily which is helping. -We have given a prescription for Zofran and told her to alternate with Compazine.  Total time spent is 25 minutes with more than 50% of the time spent face-to-face discussing scan results, further plan and counseling and coordination of care.  Orders placed  this encounter:  No orders of the defined types were placed in this encounter.     Derek Jack, MD [place] [phone]

## 2019-01-21 NOTE — Assessment & Plan Note (Signed)
1.  Metastatic breast cancer to the liver, ER/PR positive and HER-2 negative: - Abemaciclib and Faslodex from 07/02/2018 through 09/07/2018 with progression. - ERCP and plastic stent in hepatic duct on 09/22/2018 by Dr. Laural Golden. - Paclitaxel 60 mg per metered square 3 weeks on 1 week off started on 09/18/2018. - She could not tolerate Xeloda when it was added to the Taxol. - She could not tolerate dose increase in Taxol with mucositis.  Because of cytopenias, we have changed her regimen to every 2 weeks on 12/24/2018. - Last cycle was on 01/07/2019.  We reviewed blood work today.  ANC was mildly low.  We will hold her treatment today. -We will give her Neupogen today.  We will repeat her counts tomorrow and proceed with her next treatment.  2.  Hypomagnesemia: -She is not taking magnesium pills.  Magnesium today is 1.5.  She will receive 2 g of IV.  3.  Ascites: - This is from hepatic metastatic disease.  She is taking Lasix 20 mg and spironolactone 25 mg daily.  3.  Neuropathy: - She had some burning sensation on and off in the feet.  We started her on gabapentin 300 mg which she takes at bedtime which is helping.  4.  Nausea: -She has on and off nausea.  She is using scopolamine patch.  She has been taking Compazine twice daily which is helping. -We have given a prescription for Zofran and told her to alternate with Compazine.

## 2019-01-22 ENCOUNTER — Inpatient Hospital Stay (HOSPITAL_COMMUNITY): Payer: 59

## 2019-01-22 VITALS — BP 114/67 | HR 99 | Temp 97.6°F | Resp 18

## 2019-01-22 DIAGNOSIS — C50919 Malignant neoplasm of unspecified site of unspecified female breast: Secondary | ICD-10-CM

## 2019-01-22 DIAGNOSIS — C50112 Malignant neoplasm of central portion of left female breast: Secondary | ICD-10-CM

## 2019-01-22 DIAGNOSIS — Z5111 Encounter for antineoplastic chemotherapy: Secondary | ICD-10-CM | POA: Diagnosis not present

## 2019-01-22 DIAGNOSIS — Z170 Estrogen receptor positive status [ER+]: Secondary | ICD-10-CM

## 2019-01-22 LAB — CANCER ANTIGEN 27.29: CA 27.29: 49.3 U/mL — ABNORMAL HIGH (ref 0.0–38.6)

## 2019-01-22 LAB — CBC WITH DIFFERENTIAL/PLATELET
Abs Immature Granulocytes: 0.31 10*3/uL — ABNORMAL HIGH (ref 0.00–0.07)
Basophils Absolute: 0.1 10*3/uL (ref 0.0–0.1)
Basophils Relative: 0 %
Eosinophils Absolute: 0.1 10*3/uL (ref 0.0–0.5)
Eosinophils Relative: 0 %
HCT: 30.7 % — ABNORMAL LOW (ref 36.0–46.0)
Hemoglobin: 9.8 g/dL — ABNORMAL LOW (ref 12.0–15.0)
Immature Granulocytes: 1 %
Lymphocytes Relative: 7 %
Lymphs Abs: 1.7 10*3/uL (ref 0.7–4.0)
MCH: 26.3 pg (ref 26.0–34.0)
MCHC: 31.9 g/dL (ref 30.0–36.0)
MCV: 82.3 fL (ref 80.0–100.0)
Monocytes Absolute: 1.8 10*3/uL — ABNORMAL HIGH (ref 0.1–1.0)
Monocytes Relative: 7 %
Neutro Abs: 20.2 10*3/uL — ABNORMAL HIGH (ref 1.7–7.7)
Neutrophils Relative %: 85 %
Platelets: 213 10*3/uL (ref 150–400)
RBC: 3.73 MIL/uL — ABNORMAL LOW (ref 3.87–5.11)
RDW: 18.9 % — ABNORMAL HIGH (ref 11.5–15.5)
WBC Morphology: INCREASED
WBC: 24.1 10*3/uL — ABNORMAL HIGH (ref 4.0–10.5)
nRBC: 0.1 % (ref 0.0–0.2)

## 2019-01-22 LAB — CANCER ANTIGEN 15-3: CA 15-3: 40.1 U/mL — ABNORMAL HIGH (ref 0.0–25.0)

## 2019-01-22 MED ORDER — DIPHENHYDRAMINE HCL 50 MG/ML IJ SOLN
25.0000 mg | Freq: Once | INTRAMUSCULAR | Status: AC
  Administered 2019-01-22: 25 mg via INTRAVENOUS
  Filled 2019-01-22: qty 1

## 2019-01-22 MED ORDER — HEPARIN SOD (PORK) LOCK FLUSH 100 UNIT/ML IV SOLN
500.0000 [IU] | Freq: Once | INTRAVENOUS | Status: AC | PRN
  Administered 2019-01-22: 500 [IU]

## 2019-01-22 MED ORDER — FAMOTIDINE IN NACL 20-0.9 MG/50ML-% IV SOLN
20.0000 mg | Freq: Once | INTRAVENOUS | Status: AC
  Administered 2019-01-22: 20 mg via INTRAVENOUS
  Filled 2019-01-22: qty 50

## 2019-01-22 MED ORDER — SODIUM CHLORIDE 0.9 % IV SOLN
10.0000 mg | Freq: Once | INTRAVENOUS | Status: AC
  Administered 2019-01-22: 10 mg via INTRAVENOUS
  Filled 2019-01-22: qty 10

## 2019-01-22 MED ORDER — PALONOSETRON HCL INJECTION 0.25 MG/5ML
0.2500 mg | Freq: Once | INTRAVENOUS | Status: AC
  Administered 2019-01-22: 0.25 mg via INTRAVENOUS
  Filled 2019-01-22: qty 5

## 2019-01-22 MED ORDER — SODIUM CHLORIDE 0.9 % IV SOLN
54.0000 mg/m2 | Freq: Once | INTRAVENOUS | Status: AC
  Administered 2019-01-22: 108 mg via INTRAVENOUS
  Filled 2019-01-22: qty 18

## 2019-01-22 MED ORDER — SODIUM CHLORIDE 0.9 % IV SOLN
Freq: Once | INTRAVENOUS | Status: AC
  Administered 2019-01-22: 11:00:00 via INTRAVENOUS

## 2019-01-22 MED ORDER — SODIUM CHLORIDE 0.9% FLUSH
10.0000 mL | INTRAVENOUS | Status: DC | PRN
  Administered 2019-01-22: 10 mL
  Filled 2019-01-22: qty 10

## 2019-01-22 NOTE — Progress Notes (Signed)
Patient tolerated chemotherapy with no complaints voiced.  Port site clean and dry with no bruising or swelling noted at site.  Good blood return noted before and after administration of chemotherapy.  Band aid applied.  Patient left ambulatory with VSS and no s/s of distress noted.  

## 2019-01-27 ENCOUNTER — Emergency Department (HOSPITAL_COMMUNITY): Payer: 59

## 2019-01-27 ENCOUNTER — Encounter (HOSPITAL_COMMUNITY): Payer: Self-pay | Admitting: Emergency Medicine

## 2019-01-27 ENCOUNTER — Other Ambulatory Visit: Payer: Self-pay

## 2019-01-27 ENCOUNTER — Inpatient Hospital Stay (HOSPITAL_COMMUNITY)
Admission: EM | Admit: 2019-01-27 | Discharge: 2019-02-02 | DRG: 871 | Disposition: A | Discharge status: Home Health | Payer: 59 | Attending: Family Medicine | Admitting: Family Medicine

## 2019-01-27 DIAGNOSIS — R180 Malignant ascites: Secondary | ICD-10-CM | POA: Diagnosis present

## 2019-01-27 DIAGNOSIS — E43 Unspecified severe protein-calorie malnutrition: Secondary | ICD-10-CM | POA: Diagnosis present

## 2019-01-27 DIAGNOSIS — Z880 Allergy status to penicillin: Secondary | ICD-10-CM

## 2019-01-27 DIAGNOSIS — C50919 Malignant neoplasm of unspecified site of unspecified female breast: Secondary | ICD-10-CM

## 2019-01-27 DIAGNOSIS — F329 Major depressive disorder, single episode, unspecified: Secondary | ICD-10-CM | POA: Diagnosis present

## 2019-01-27 DIAGNOSIS — Z841 Family history of disorders of kidney and ureter: Secondary | ICD-10-CM

## 2019-01-27 DIAGNOSIS — A419 Sepsis, unspecified organism: Principal | ICD-10-CM | POA: Diagnosis present

## 2019-01-27 DIAGNOSIS — Z20828 Contact with and (suspected) exposure to other viral communicable diseases: Secondary | ICD-10-CM | POA: Diagnosis present

## 2019-01-27 DIAGNOSIS — K219 Gastro-esophageal reflux disease without esophagitis: Secondary | ICD-10-CM | POA: Diagnosis present

## 2019-01-27 DIAGNOSIS — E860 Dehydration: Secondary | ICD-10-CM | POA: Diagnosis not present

## 2019-01-27 DIAGNOSIS — Z833 Family history of diabetes mellitus: Secondary | ICD-10-CM

## 2019-01-27 DIAGNOSIS — K625 Hemorrhage of anus and rectum: Secondary | ICD-10-CM

## 2019-01-27 DIAGNOSIS — A09 Infectious gastroenteritis and colitis, unspecified: Secondary | ICD-10-CM | POA: Diagnosis present

## 2019-01-27 DIAGNOSIS — R188 Other ascites: Secondary | ICD-10-CM

## 2019-01-27 DIAGNOSIS — E872 Acidosis, unspecified: Secondary | ICD-10-CM

## 2019-01-27 DIAGNOSIS — R197 Diarrhea, unspecified: Secondary | ICD-10-CM | POA: Diagnosis present

## 2019-01-27 DIAGNOSIS — Z803 Family history of malignant neoplasm of breast: Secondary | ICD-10-CM

## 2019-01-27 DIAGNOSIS — Z8249 Family history of ischemic heart disease and other diseases of the circulatory system: Secondary | ICD-10-CM

## 2019-01-27 DIAGNOSIS — Z6828 Body mass index (BMI) 28.0-28.9, adult: Secondary | ICD-10-CM

## 2019-01-27 DIAGNOSIS — I959 Hypotension, unspecified: Secondary | ICD-10-CM | POA: Diagnosis present

## 2019-01-27 DIAGNOSIS — T451X5A Adverse effect of antineoplastic and immunosuppressive drugs, initial encounter: Secondary | ICD-10-CM

## 2019-01-27 DIAGNOSIS — Z801 Family history of malignant neoplasm of trachea, bronchus and lung: Secondary | ICD-10-CM

## 2019-01-27 DIAGNOSIS — Z9221 Personal history of antineoplastic chemotherapy: Secondary | ICD-10-CM

## 2019-01-27 DIAGNOSIS — Z825 Family history of asthma and other chronic lower respiratory diseases: Secondary | ICD-10-CM

## 2019-01-27 DIAGNOSIS — E876 Hypokalemia: Secondary | ICD-10-CM | POA: Diagnosis present

## 2019-01-27 DIAGNOSIS — D701 Agranulocytosis secondary to cancer chemotherapy: Secondary | ICD-10-CM | POA: Diagnosis present

## 2019-01-27 DIAGNOSIS — K529 Noninfective gastroenteritis and colitis, unspecified: Secondary | ICD-10-CM

## 2019-01-27 DIAGNOSIS — Z8261 Family history of arthritis: Secondary | ICD-10-CM

## 2019-01-27 DIAGNOSIS — C787 Secondary malignant neoplasm of liver and intrahepatic bile duct: Secondary | ICD-10-CM | POA: Diagnosis present

## 2019-01-27 DIAGNOSIS — Z9010 Acquired absence of unspecified breast and nipple: Secondary | ICD-10-CM

## 2019-01-27 DIAGNOSIS — R000 Tachycardia, unspecified: Secondary | ICD-10-CM | POA: Diagnosis present

## 2019-01-27 DIAGNOSIS — R112 Nausea with vomiting, unspecified: Secondary | ICD-10-CM | POA: Diagnosis present

## 2019-01-27 IMAGING — CR PORTABLE ABDOMEN - 1 VIEW
2 series · 2 of 2 positions shown · non-contrast
Comparison: CT dated [DATE]

CLINICAL DATA: Abdominal pain. The patient is currently on
chemotherapy.

EXAM:
PORTABLE ABDOMEN - 1 VIEW

[supine ap (1 of 2)]
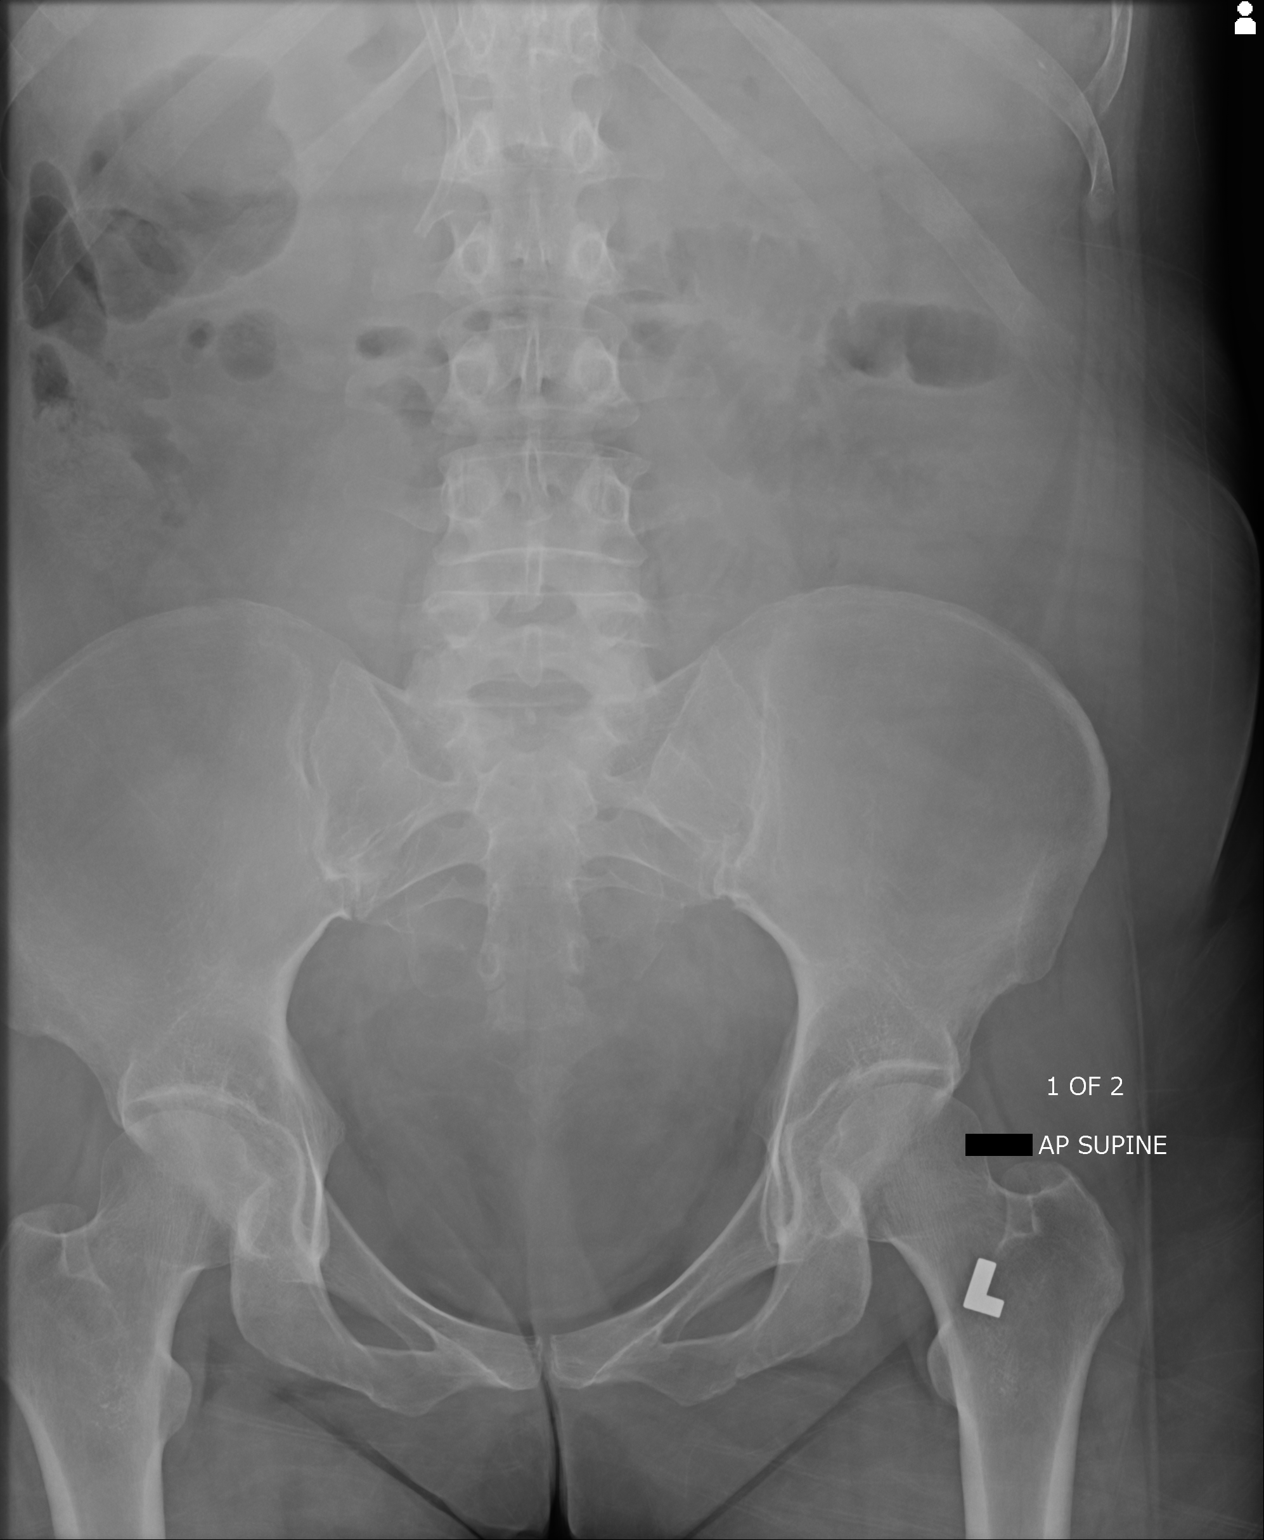

[supine ap (2 of 2)]
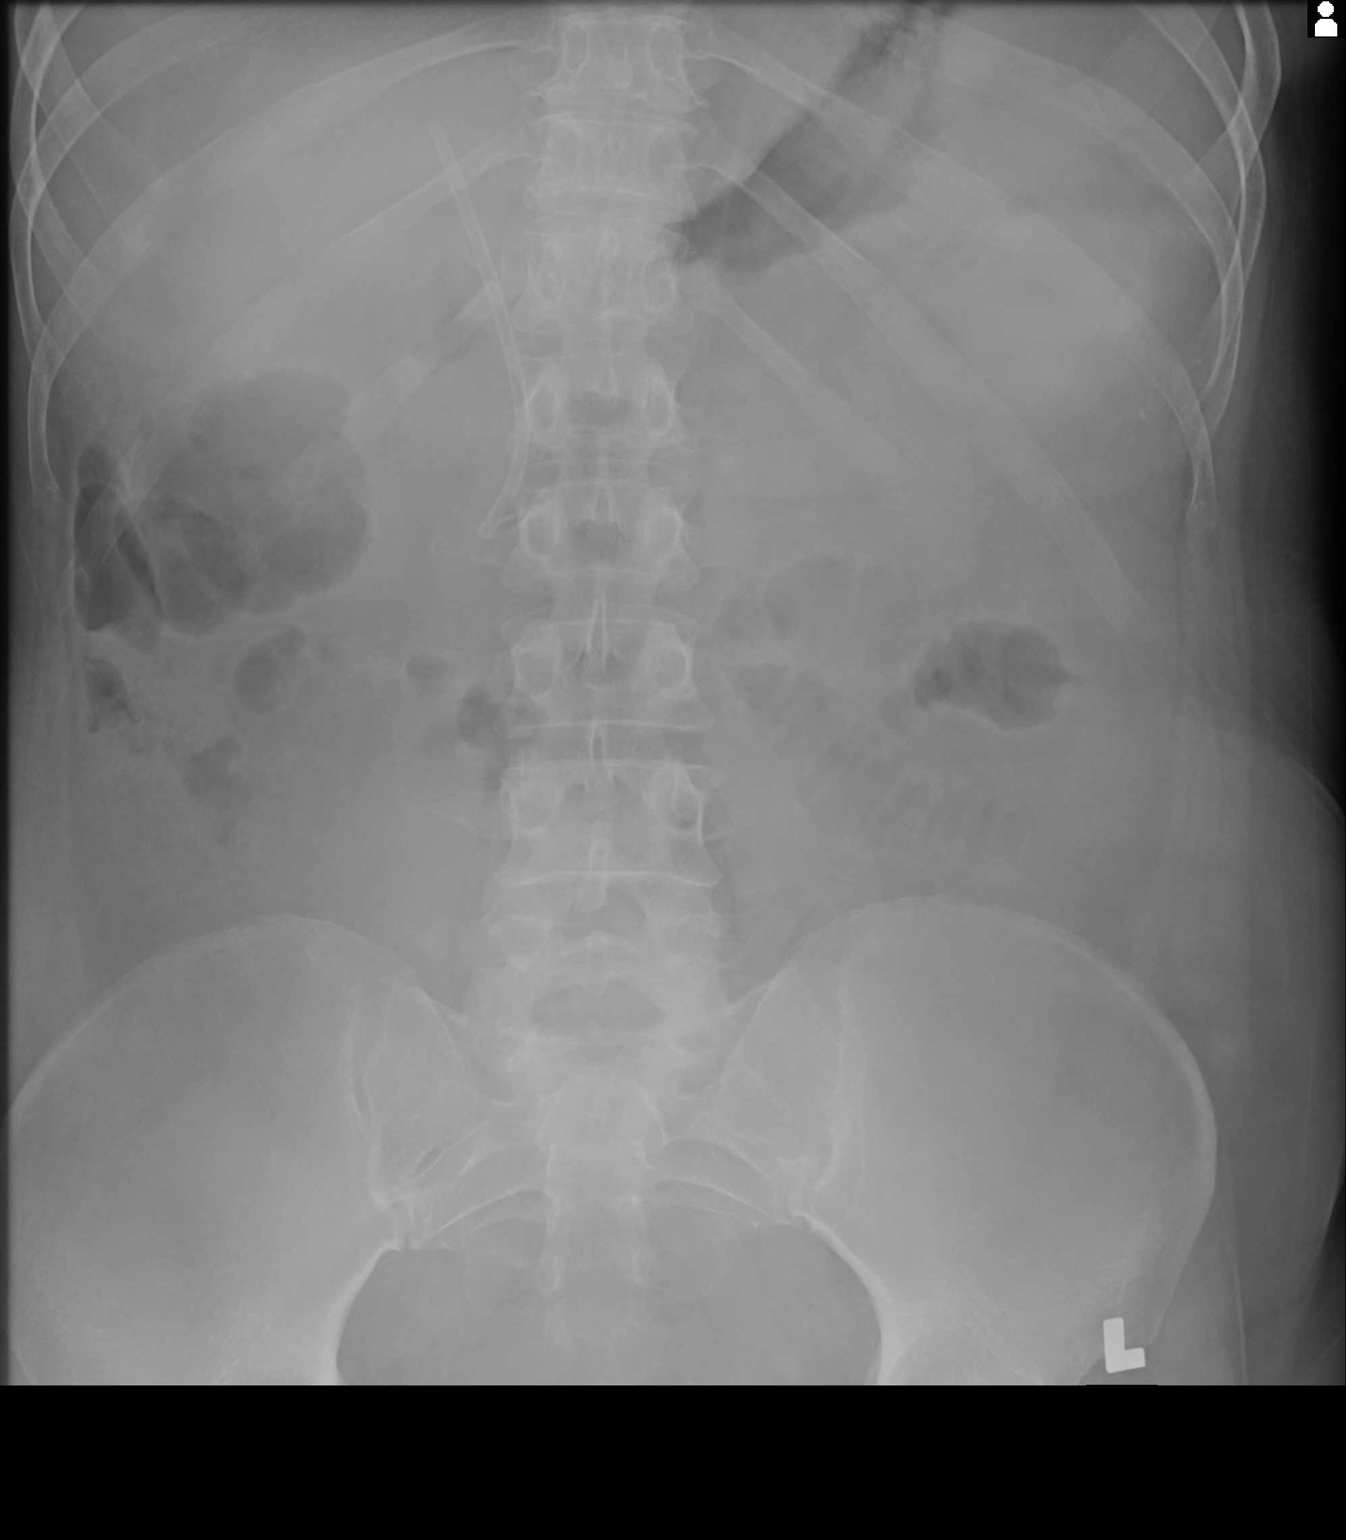

[2 of 2 positions shown; findings below may reference images not displayed]

FINDINGS: The bowel gas pattern is nonspecific and nonobstructive. There is a
biliary stent in place. No definite pneumatosis or free air.
IMPRESSION: Nonobstructive bowel gas pattern.

## 2019-01-27 MED ORDER — ONDANSETRON HCL 4 MG/2ML IJ SOLN
4.0000 mg | Freq: Once | INTRAMUSCULAR | Status: AC
  Administered 2019-01-27: 4 mg via INTRAVENOUS
  Filled 2019-01-27: qty 2

## 2019-01-27 MED ORDER — FENTANYL CITRATE (PF) 100 MCG/2ML IJ SOLN
50.0000 ug | Freq: Once | INTRAMUSCULAR | Status: AC
  Administered 2019-01-27: 50 ug via INTRAVENOUS
  Filled 2019-01-27: qty 2

## 2019-01-27 MED ORDER — SODIUM CHLORIDE 0.9 % IV BOLUS
1000.0000 mL | Freq: Once | INTRAVENOUS | Status: AC
  Administered 2019-01-28: 1000 mL via INTRAVENOUS

## 2019-01-27 MED ORDER — SODIUM CHLORIDE 0.9 % IV BOLUS
1000.0000 mL | Freq: Once | INTRAVENOUS | Status: AC
  Administered 2019-01-27: 1000 mL via INTRAVENOUS

## 2019-01-27 NOTE — ED Triage Notes (Signed)
Pt last chemo Friday of last week. Pt states started nausea since Sunday and vomiting and diarrhea started today.

## 2019-01-27 NOTE — ED Provider Notes (Signed)
[facility] EMERGENCY DEPARTMENT Provider Note   CSN: [id number] Arrival date & time: 01/27/19  2206  Time seen 11:08 PM  History   Chief Complaint Chief Complaint  Patient presents with  . Nausea    HPI Pamela Long is a 48 y.o. female.     HPI patient has metastatic breast cancer and is being followed by [place].  She has had a hepatic stent placed by ERCP on April 14, she has chronic ascites.  She also has been having some low magnesium and got 2 g IV on August 13.  She also got Neupogen on August 13 for a low total white blood cell count in anticipation of chemotherapy which was given on August 14.  I believe it was Taxol.  She also got IV Decadron.  She states she started getting nausea on the 16th which is been a problem complaint after her chemotherapy.  However today for the first time she had actual vomiting and has vomited 3 times.  She also states she has had diarrhea today about 7-8 times that she describes as soft and now is only small amounts.  She denies it being black or green and states it is brown color.  She states she does see some blood in the toilet and when she wipes and she speculates it is from her hemorrhoids which are mildly sore.  She also states this afternoon she had a fever of 100.2 and she thinks maybe she had chills.  She states she is lightheaded and dizzy if she stands up too quickly.  She states on the 17th she started getting lower abdominal pain and lower back pain that is bilateral.  She states she has had it before however it has never been evaluated.  In the past she thought it was related to constipation.  She denies any cough, rhinorrhea, sore throat, any urinary problems, and denies that her abdomen is more bloated than normal.  She denies being around anybody else who is sick.  PCP Doree Albee, MD Hem/Onc Dr [name] GI Dr Laural Golden  Past Medical History:  Diagnosis Date  . Anemia   . Anxiety   . Asthma   . Breast cancer ([place])     Left, 2017  . Complication of anesthesia   . Depression   . GERD (gastroesophageal reflux disease)   . Migraine   . OAB (overactive bladder)   . PONV (postoperative nausea and vomiting)   . Seasonal allergies     Patient Active Problem List   Diagnosis Date Noted  . Status post thoracentesis   . Palliative care by specialist   . Community acquired pneumonia of left lung   . Acute on chronic respiratory failure with hypoxia ([place]) 12/13/2018  . Sepsis due to undetermined organism ([place]) 12/13/2018  . Pleural effusion on left   . HCAP (healthcare-associated pneumonia) 12/12/2018  . Febrile neutropenia ([place]) 12/12/2018  . Hypokalemia 12/12/2018  . Hyponatremia 12/12/2018  . Severe protein-calorie malnutrition ([place]) 12/12/2018  . Sepsis ([place]) 12/12/2018  . Obstructive jaundice 09/21/2018  . Goals of care, counseling/discussion 09/17/2018  . Abdominal pain 09/09/2018  . RUQ pain 07/08/2018  . Metastatic breast cancer ([place]) 07/02/2018  . Liver mass 06/27/2018  . GERD (gastroesophageal reflux disease) 04/02/2018  . Rectal bleeding 04/02/2018  . Constipation 04/02/2018  . Acquired absence of left breast 08/01/2017  . OAB (overactive bladder) 07/31/2017  . Vitamin D deficiency 02/26/2017  . Prediabetes 02/26/2017  . HLD (hyperlipidemia) 02/26/2017  .  Asthma 02/12/2017  . Depression 02/12/2017  . Migraines 02/12/2017  . Arthralgia 02/12/2017  . Sleep-disordered breathing 02/12/2017  . Positive ANA (antinuclear antibody) 02/12/2017  . Angular cheilitis 02/12/2017  . Class 2 obesity due to excess calories without serious comorbidity with body mass index (BMI) of 35.0 to 35.9 in adult 02/12/2017  . Malignant neoplasm of central portion of left breast in female, estrogen receptor positive ([place]) 01/05/2017    Past Surgical History:  Procedure Laterality Date  . ABDOMINAL HYSTERECTOMY     breast cancer  . ANKLE RECONSTRUCTION Right 1989  . BILIARY STENT PLACEMENT N/A 09/22/2018    Procedure: BILIARY STENT PLACEMENT;  Surgeon: Rogene Houston, MD;  Location: [facility] ENDO SUITE;  Service: Endoscopy;  Laterality: N/A;  . BREAST SURGERY    . COLONOSCOPY WITH PROPOFOL N/A 08/06/2018   Procedure: COLONOSCOPY WITH PROPOFOL;  Surgeon: Danie Binder, MD;  Location: [facility] ENDO SUITE;  Service: Endoscopy;  Laterality: N/A;  1:30pm  . ERCP N/A 09/22/2018   Procedure: ENDOSCOPIC RETROGRADE CHOLANGIOPANCREATOGRAPHY (ERCP);  Surgeon: Rogene Houston, MD;  Location: [facility] ENDO SUITE;  Service: Endoscopy;  Laterality: N/A;  . FOREIGN BODY REMOVAL N/A 08/29/2017   from lip  . KNEE SURGERY Right 1989   bone spur  . MASTECTOMY  2017   bil mastectomies  . PORTACATH PLACEMENT Left 12/23/2018   Procedure: INSERTION PORT-A-CATH LEFT SUBCLAVIAN;  Surgeon: Aviva Signs, MD;  Location: [facility] ORS;  Service: General;  Laterality: Left;  . SINUSOTOMY       OB History    Gravida  1   Para  1   Term  1   Preterm      AB      Living        SAB      TAB      Ectopic      Multiple      Live Births               Home Medications    Prior to Admission medications   Medication Sig Start Date End Date Taking? Authorizing Provider  acetaminophen (TYLENOL) 325 MG tablet Take 2 tablets (650 mg total) by mouth every 6 (six) hours as needed for mild pain, fever or headache (T over 101). 12/16/18   Emokpae, Courage, MD  b complex vitamins capsule Take 1 capsule by mouth daily.    [provider]  Cholecalciferol (VITAMIN D3 GUMMIES) 25 MCG (1000 UT) CHEW Chew 2,000-4,000 Units by mouth daily.    [provider]  escitalopram (LEXAPRO) 20 MG tablet Take 1 tablet (20 mg total) by mouth daily. 02/12/17   Raylene Everts, MD  furosemide (LASIX) 20 MG tablet TAKE 1 TABLET BY MOUTH EVERY DAY 01/21/19   Derek Jack, MD  gabapentin (NEURONTIN) 300 MG capsule Take 1 capsule (300 mg total) by mouth at bedtime. 01/07/19   Derek Jack, MD  lidocaine-prilocaine (EMLA)  cream Apply 1 application topically as needed. 12/24/18   Derek Jack, MD  Lysine 1000 MG TABS Take 1,000 mg by mouth daily.    [provider]  magic mouthwash w/lidocaine SOLN Take 5 mLs by mouth 4 (four) times daily as needed for mouth pain. 5/40/98   Delora Fuel, MD  magnesium oxide (MAG-OX) 400 MG tablet TAKE 1 TABLET BY MOUTH EVERY DAY 12/21/18   Lockamy, Randi L, NP-C  nystatin (MYCOSTATIN) 100000 UNIT/ML suspension Take 5 mLs (500,000 Units total) by mouth 4 (four) times daily. Swish and  gargle - then either spit or swallow 12/10/48   Delora Fuel, MD  ondansetron (ZOFRAN-ODT) 8 MG disintegrating tablet Take 1 tablet (8 mg total) by mouth every 8 (eight) hours as needed for nausea or vomiting. 01/21/19   Derek Jack, MD  oxyCODONE-acetaminophen (PERCOCET) 5-325 MG tablet Take 1 tablet by mouth every 4 (four) hours as needed for moderate pain. 12/16/18   Roxan Hockey, MD  PACLitaxel (TAXOL IV) Inject into the vein once a week.     [provider]  pantoprazole (PROTONIX) 40 MG tablet TAKE 1 TABLET BY MOUTH EVERY DAY BEFORE BREAKFAST 11/19/18   Annitta Needs, NP  Polyvinyl Alcohol-Povidone PF (REFRESH) 1.4-0.6 % SOLN Place 1 drop into both eyes as needed (for dry eyes).     [provider]  potassium chloride SA (K-DUR) 20 MEQ tablet Take 2 tablets (40 mEq total) by mouth daily. 12/16/18   Roxan Hockey, MD  prochlorperazine (COMPAZINE) 10 MG tablet Take 1 tablet (10 mg total) by mouth every 6 (six) hours as needed for nausea or vomiting. 01/07/19   Derek Jack, MD  promethazine (PHENERGAN) 25 MG tablet Take 1 tablet (25 mg total) by mouth every 6 (six) hours as needed for nausea or vomiting. 10/02/18   Derek Jack, MD  rizatriptan (MAXALT-MLT) 10 MG disintegrating tablet Take 10 mg by mouth every 2 (two) hours as needed for migraine.     [provider]  scopolamine (TRANSDERM-SCOP) 1 MG/3DAYS Place 1 patch (1.5 mg total) onto  the skin every 3 (three) days. 01/18/19   Derek Jack, MD  senna-docusate (SENOKOT-S) 8.6-50 MG tablet Take 2 tablets by mouth at bedtime as needed for mild constipation or moderate constipation. 12/16/18 12/16/19  Roxan Hockey, MD  spironolactone (ALDACTONE) 25 MG tablet TAKE 1 TABLET BY MOUTH EVERY DAY 01/21/19   Derek Jack, MD  temazepam (RESTORIL) 15 MG capsule Take 1 capsule (15 mg total) by mouth at bedtime as needed for sleep. 12/03/18   Derek Jack, MD    Family History Family History  Problem Relation Age of Onset  . Arthritis Mother   . COPD Mother   . Depression Mother   . Diabetes Mother   . Kidney disease Father   . Heart disease Father 39  . Drug abuse Father   . Hypertension Father   . Diabetes Daughter   . Hashimoto's thyroiditis Daughter   . Irritable bowel syndrome Daughter   . Autism spectrum disorder Daughter   . Early death Maternal Grandmother        drowned  . Early death Maternal Grandfather   . Heart disease Maternal Grandfather   . Heart disease Paternal Grandfather   . Breast cancer Maternal Aunt   . Breast cancer Cousin   . Lung cancer Maternal Aunt   . Lung cancer Maternal Uncle   . Colon cancer Neg Hx     Social History Social History   Tobacco Use  . Smoking status: Never Smoker  . Smokeless tobacco: Never Used  Substance Use Topics  . Alcohol use: No  . Drug use: No     Allergies   Corn-containing products, Salagen [pilocarpine], Tape, Amoxicillin, Caffeine, and Tetanus toxoids   Review of Systems Review of Systems  All other systems reviewed and are negative.    Physical Exam Updated Vital Signs BP 126/88 (BP Location: Right Arm)   Pulse (!) 152   Temp 98.4 F (36.9 C) (Oral)   Resp 20   Wt 76.6 kg  SpO2 97%   BMI 28.10 kg/m   Vital signs normal except for tachycardia   Physical Exam Vitals signs and nursing note reviewed.  Constitutional:      General: She is not in acute distress.     Appearance: Normal appearance. She is well-developed. She is not ill-appearing or toxic-appearing.  HENT:     Head: Normocephalic and atraumatic.     Right Ear: External ear normal.     Left Ear: External ear normal.     Nose: Nose normal. No mucosal edema or rhinorrhea.     Mouth/Throat:     Mouth: Mucous membranes are dry.     Dentition: No dental abscesses.     Pharynx: No uvula swelling.  Eyes:     Extraocular Movements: Extraocular movements intact.     Conjunctiva/sclera: Conjunctivae normal.     Pupils: Pupils are equal, round, and reactive to light.  Neck:     Musculoskeletal: Full passive range of motion without pain, normal range of motion and neck supple.  Cardiovascular:     Rate and Rhythm: Regular rhythm. Tachycardia present.     Heart sounds: Normal heart sounds. No murmur. No friction rub. No gallop.   Pulmonary:     Effort: Pulmonary effort is normal. No respiratory distress.     Breath sounds: Normal breath sounds. No wheezing, rhonchi or rales.  Chest:     Chest wall: No tenderness or crepitus.  Abdominal:     General: Bowel sounds are normal. There is distension.     Palpations: Abdomen is soft.     Tenderness: There is no abdominal tenderness. There is no guarding or rebound.     Comments: Patient has mild soft distention which she states is her usual.  She has some tenderness in the right upper quadrant however her discomfort this evening is in her lower abdomen.  She seems to be the most tender in the suprapubic area.  Musculoskeletal: Normal range of motion.        General: No tenderness.     Comments: Moves all extremities well.   Skin:    General: Skin is warm and dry.     Capillary Refill: Capillary refill takes less than 2 seconds.     Coloration: Skin is pale.     Findings: No erythema or rash.  Neurological:     General: No focal deficit present.     Mental Status: She is alert and oriented to person, place, and time.     Cranial Nerves: No  cranial nerve deficit.  Psychiatric:        Mood and Affect: Affect is flat.        Speech: Speech is delayed.        Behavior: Behavior is slowed.      ED Treatments / Results  Labs (all labs ordered are listed, but only abnormal results are displayed)  Results for orders placed or performed during the hospital encounter of 01/27/19  Comprehensive metabolic panel  Result Value Ref Range   Sodium 133 (L) 135 - 145 mmol/L   Potassium 3.9 3.5 - 5.1 mmol/L   Chloride 97 (L) 98 - 111 mmol/L   CO2 25 22 - 32 mmol/L   Glucose, Bld 146 (H) 70 - 99 mg/dL   BUN 17 6 - 20 mg/dL   Creatinine, Ser 0.87 0.44 - 1.00 mg/dL   Calcium 9.1 8.9 - 10.3 mg/dL   Total Protein 7.1 6.5 - 8.1 g/dL   Albumin  3.1 (L) 3.5 - 5.0 g/dL   AST 45 (H) 15 - 41 U/L   ALT 25 0 - 44 U/L   Alkaline Phosphatase 125 38 - 126 U/L   Total Bilirubin 2.3 (H) 0.3 - 1.2 mg/dL   GFR calc non Af Amer >60 >60 mL/min   GFR calc Af Amer >60 >60 mL/min   Anion gap 11 5 - 15  Lipase, blood  Result Value Ref Range   Lipase 18 11 - 51 U/L  CBC with Differential  Result Value Ref Range   WBC 1.1 (LL) 4.0 - 10.5 K/uL   RBC 4.35 3.87 - 5.11 MIL/uL   Hemoglobin 11.3 (L) 12.0 - 15.0 g/dL   HCT 35.6 (L) 36.0 - 46.0 %   MCV 81.8 80.0 - 100.0 fL   MCH 26.0 26.0 - 34.0 pg   MCHC 31.7 30.0 - 36.0 g/dL   RDW 17.6 (H) 11.5 - 15.5 %   Platelets 212 150 - 400 K/uL   nRBC 0.0 0.0 - 0.2 %   Neutrophils Relative % 48 %   Neutro Abs 0.5 (L) 1.7 - 7.7 K/uL   Lymphocytes Relative 44 %   Lymphs Abs 0.5 (L) 0.7 - 4.0 K/uL   Monocytes Relative 6 %   Monocytes Absolute 0.1 0.1 - 1.0 K/uL   Eosinophils Relative 0 %   Eosinophils Absolute 0.0 0.0 - 0.5 K/uL   Basophils Relative 1 %   Basophils Absolute 0.0 0.0 - 0.1 K/uL   Immature Granulocytes 1 %   Abs Immature Granulocytes 0.01 0.00 - 0.07 K/uL  Lactic acid, plasma  Result Value Ref Range   Lactic Acid, Venous 3.3 (HH) 0.5 - 1.9 mmol/L  Lactic acid, plasma  Result Value Ref Range    Lactic Acid, Venous 3.2 (HH) 0.5 - 1.9 mmol/L  Urinalysis, Routine w reflex microscopic  Result Value Ref Range   Color, Urine AMBER (A) YELLOW   APPearance HAZY (A) CLEAR   Specific Gravity, Urine 1.020 1.005 - 1.030   pH 5.0 5.0 - 8.0   Glucose, UA NEGATIVE NEGATIVE mg/dL   Hgb urine dipstick NEGATIVE NEGATIVE   Bilirubin Urine NEGATIVE NEGATIVE   Ketones, ur NEGATIVE NEGATIVE mg/dL   Protein, ur 30 (A) NEGATIVE mg/dL   Nitrite NEGATIVE NEGATIVE   Leukocytes,Ua NEGATIVE NEGATIVE   RBC / HPF 0-5 0 - 5 RBC/hpf   WBC, UA 6-10 0 - 5 WBC/hpf   Bacteria, UA NONE SEEN NONE SEEN   Squamous Epithelial / LPF 6-10 0 - 5   Mucus PRESENT    Hyaline Casts, UA PRESENT    Cellular Cast, UA 7    Non Squamous Epithelial 0-5 (A) NONE SEEN  Magnesium  Result Value Ref Range   Magnesium 1.6 (L) 1.7 - 2.4 mg/dL   Laboratory interpretation all normal except neutropenia     Results for orders placed or performed in visit on 01/22/19  CBC with Differential/Platelet  Result Value Ref Range   WBC 24.1 (H) 4.0 - 10.5 K/uL   RBC 3.73 (L) 3.87 - 5.11 MIL/uL   Hemoglobin 9.8 (L) 12.0 - 15.0 g/dL   HCT 30.7 (L) 36.0 - 46.0 %   MCV 82.3 80.0 - 100.0 fL   MCH 26.3 26.0 - 34.0 pg   MCHC 31.9 30.0 - 36.0 g/dL   RDW 18.9 (H) 11.5 - 15.5 %   Platelets 213 150 - 400 K/uL   nRBC 0.1 0.0 - 0.2 %   Neutrophils Relative %  85 %   Neutro Abs 20.2 (H) 1.7 - 7.7 K/uL   Lymphocytes Relative 7 %   Lymphs Abs 1.7 0.7 - 4.0 K/uL   Monocytes Relative 7 %   Monocytes Absolute 1.8 (H) 0.1 - 1.0 K/uL   Eosinophils Relative 0 %   Eosinophils Absolute 0.1 0.0 - 0.5 K/uL   Basophils Relative 0 %   Basophils Absolute 0.1 0.0 - 0.1 K/uL   WBC Morphology INCREASED BANDS (>20% BANDS)    Immature Granulocytes 1 %   Abs Immature Granulocytes 0.31 (H) 0.00 - 0.07 K/uL          EKG EKG Interpretation  Date/Time:  Thursday January 28 2019 01:06:58 EDT Ventricular Rate:  126 PR Interval:    QRS Duration: 76  QT Interval:  326 QTC Calculation: 472 R Axis:   58 Text Interpretation:  Sinus tachycardia Low voltage, precordial leads Baseline wander in lead(s) II V Suspect arm lead reversal, interpretation assumes no reversal since last tracing no significant change Confirmed by Rolland Porter [phone]) on 01/28/2019 1:11:07 AM   Radiology No results found.   Portable Abd-- Non-obstructive bowel gas pattern, biliary stent noted. Read by Constance Holster.   CT abdomen/pelvis with contrast done tonight shows descending and sigmoid colitis.  Left pleural effusion decreased.  Small volume ascites, improved.  Large hepatic met again seen.  Biliary stent appropriately positioned.  Read by Dr. Jerilynn Mages. [place].   Ct Abdomen Pelvis W Wo Contrast  Result Date: 12/30/2018 CLINICAL DATA:  Restaging metastatic breast cancer. History of metastatic liver disease. Ongoing chemotherapy.IMPRESSION: 1. Significant improved CT appearance of the liver as detailed above. No new or progressive findings. 2. Large volume abdominal/pelvic ascites without obvious omental lesions or mesenteric implants. 3. Common bile duct stent in good position without biliary dilatation. 4. Bilateral pleural effusions, left greater than right with overlying atelectasis. Small pericardial effusion also. 5. No CT findings to suggest osseous metastatic disease. Electronically Signed   By: Marijo Sanes M.D.   On: 12/30/2018 10:52   Procedures .Critical Care Performed by: Rolland Porter, MD Authorized by: Rolland Porter, MD   Critical care provider statement:    Critical care time (minutes):  39   Critical care was necessary to treat or prevent imminent or life-threatening deterioration of the following conditions:  Sepsis   Critical care was time spent personally by me on the following activities:  Discussions with consultants, discussions with primary provider, examination of patient, obtaining history from patient or surrogate, ordering and review of laboratory  studies, ordering and review of radiographic studies, pulse oximetry, re-evaluation of patient's condition and review of old charts   (including critical care time)  Medications Ordered in ED Medications  Tbo-Filgrastim (GRANIX) injection 480 mcg (has no administration in time range)  iohexol (OMNIPAQUE) 300 MG/ML solution 100 mL (has no administration in time range)  ondansetron (ZOFRAN) injection 4 mg (has no administration in time range)  ciprofloxacin (CIPRO) 400 MG/200ML IVPB (has no administration in time range)  metroNIDAZOLE (FLAGYL) 5-0.79 MG/ML-% IVPB (has no administration in time range)  sodium chloride 0.9 % bolus 1,000 mL (0 mLs Intravenous Stopped 01/28/19 0040)  sodium chloride 0.9 % bolus 1,000 mL (1,000 mLs Intravenous New Bag/Given 01/28/19 0042)  ondansetron (ZOFRAN) injection 4 mg (4 mg Intravenous Given 01/27/19 2338)  fentaNYL (SUBLIMAZE) injection 50 mcg (50 mcg Intravenous Given 01/27/19 2337)  magnesium sulfate IVPB 2 g 50 mL (2 g Intravenous New Bag/Given 01/28/19 0126)  sodium chloride 0.9 % bolus  1,000 mL (1,000 mLs Intravenous New Bag/Given 01/28/19 0126)  filgrastim (NEUPOGEN) 480 MCG/1.6ML injection (has no administration in time range)     Initial Impression / Assessment and Plan / ED Course  I have reviewed the triage vital signs and the nursing notes.  Pertinent labs & imaging results that were available during my care of the patient were reviewed by me and considered in my medical decision making (see chart for details).      Patient was given IV fluids, her heart rate improved after some IV fluids but she still has some tachycardia.  Her temperature was rechecked and she remained afebrile in the ED.  1:13 AM I spoke to Dr. [name], Oncology, and he recommends giving her Neupogen 480 mcg subcu.  If we send her home he recommends putting her on Augmentin twice a day.  However if we feel like she should be admitted have the hospitalist consult him and he  will see her in the hospital.  He states the abdominal pain and diarrhea is unusual for her and he is agreeable to getting a CT scan, it was ordered.  1:20 AM patient had gone to the bathroom and passed some bright red rectal blood, it appears to be 10 to 20 cc.  2:20 AM after reviewing her CT result she was started on IV Cipro and Flagyl for her colitis.  We discussed the need for admission and she is agreeable.  2:44 AM Dr. Darrick Meigs, hospitalist will admit.  Final Clinical Impressions(s) / ED Diagnoses   Final diagnoses:  Nausea vomiting and diarrhea  Dehydration  Lactic acid acidosis  Chemotherapy-induced neutropenia (HCC)  Rectal bleeding  Colitis   Plan admission  Rolland Porter, MD, Barbette Or, MD 01/28/19 [phone]

## 2019-01-28 ENCOUNTER — Emergency Department (HOSPITAL_COMMUNITY): Payer: 59

## 2019-01-28 ENCOUNTER — Other Ambulatory Visit: Payer: Self-pay

## 2019-01-28 DIAGNOSIS — E872 Acidosis, unspecified: Secondary | ICD-10-CM | POA: Diagnosis present

## 2019-01-28 DIAGNOSIS — Z801 Family history of malignant neoplasm of trachea, bronchus and lung: Secondary | ICD-10-CM | POA: Diagnosis not present

## 2019-01-28 DIAGNOSIS — R197 Diarrhea, unspecified: Secondary | ICD-10-CM

## 2019-01-28 DIAGNOSIS — Z8261 Family history of arthritis: Secondary | ICD-10-CM | POA: Diagnosis not present

## 2019-01-28 DIAGNOSIS — K5150 Left sided colitis without complications: Secondary | ICD-10-CM | POA: Diagnosis not present

## 2019-01-28 DIAGNOSIS — D701 Agranulocytosis secondary to cancer chemotherapy: Secondary | ICD-10-CM

## 2019-01-28 DIAGNOSIS — Z20828 Contact with and (suspected) exposure to other viral communicable diseases: Secondary | ICD-10-CM | POA: Diagnosis present

## 2019-01-28 DIAGNOSIS — E43 Unspecified severe protein-calorie malnutrition: Secondary | ICD-10-CM | POA: Diagnosis present

## 2019-01-28 DIAGNOSIS — Z9221 Personal history of antineoplastic chemotherapy: Secondary | ICD-10-CM

## 2019-01-28 DIAGNOSIS — Z803 Family history of malignant neoplasm of breast: Secondary | ICD-10-CM | POA: Diagnosis not present

## 2019-01-28 DIAGNOSIS — T451X5A Adverse effect of antineoplastic and immunosuppressive drugs, initial encounter: Secondary | ICD-10-CM | POA: Diagnosis present

## 2019-01-28 DIAGNOSIS — F329 Major depressive disorder, single episode, unspecified: Secondary | ICD-10-CM | POA: Diagnosis present

## 2019-01-28 DIAGNOSIS — R112 Nausea with vomiting, unspecified: Secondary | ICD-10-CM | POA: Diagnosis not present

## 2019-01-28 DIAGNOSIS — Z8249 Family history of ischemic heart disease and other diseases of the circulatory system: Secondary | ICD-10-CM | POA: Diagnosis not present

## 2019-01-28 DIAGNOSIS — K219 Gastro-esophageal reflux disease without esophagitis: Secondary | ICD-10-CM | POA: Diagnosis present

## 2019-01-28 DIAGNOSIS — Z825 Family history of asthma and other chronic lower respiratory diseases: Secondary | ICD-10-CM | POA: Diagnosis not present

## 2019-01-28 DIAGNOSIS — C50919 Malignant neoplasm of unspecified site of unspecified female breast: Secondary | ICD-10-CM

## 2019-01-28 DIAGNOSIS — E860 Dehydration: Secondary | ICD-10-CM

## 2019-01-28 DIAGNOSIS — K529 Noninfective gastroenteritis and colitis, unspecified: Secondary | ICD-10-CM

## 2019-01-28 DIAGNOSIS — A419 Sepsis, unspecified organism: Secondary | ICD-10-CM | POA: Diagnosis present

## 2019-01-28 DIAGNOSIS — E876 Hypokalemia: Secondary | ICD-10-CM | POA: Diagnosis present

## 2019-01-28 DIAGNOSIS — A09 Infectious gastroenteritis and colitis, unspecified: Secondary | ICD-10-CM | POA: Diagnosis present

## 2019-01-28 DIAGNOSIS — Z841 Family history of disorders of kidney and ureter: Secondary | ICD-10-CM | POA: Diagnosis not present

## 2019-01-28 DIAGNOSIS — Z6828 Body mass index (BMI) 28.0-28.9, adult: Secondary | ICD-10-CM | POA: Diagnosis not present

## 2019-01-28 DIAGNOSIS — R180 Malignant ascites: Secondary | ICD-10-CM | POA: Diagnosis present

## 2019-01-28 DIAGNOSIS — Z833 Family history of diabetes mellitus: Secondary | ICD-10-CM | POA: Diagnosis not present

## 2019-01-28 DIAGNOSIS — C787 Secondary malignant neoplasm of liver and intrahepatic bile duct: Secondary | ICD-10-CM | POA: Diagnosis present

## 2019-01-28 LAB — CBC WITH DIFFERENTIAL/PLATELET
Abs Immature Granulocytes: 0.01 10*3/uL (ref 0.00–0.07)
Basophils Absolute: 0 10*3/uL (ref 0.0–0.1)
Basophils Relative: 1 %
Eosinophils Absolute: 0 10*3/uL (ref 0.0–0.5)
Eosinophils Relative: 0 %
HCT: 35.6 % — ABNORMAL LOW (ref 36.0–46.0)
Hemoglobin: 11.3 g/dL — ABNORMAL LOW (ref 12.0–15.0)
Immature Granulocytes: 1 %
Lymphocytes Relative: 44 %
Lymphs Abs: 0.5 10*3/uL — ABNORMAL LOW (ref 0.7–4.0)
MCH: 26 pg (ref 26.0–34.0)
MCHC: 31.7 g/dL (ref 30.0–36.0)
MCV: 81.8 fL (ref 80.0–100.0)
Monocytes Absolute: 0.1 10*3/uL (ref 0.1–1.0)
Monocytes Relative: 6 %
Neutro Abs: 0.5 10*3/uL — ABNORMAL LOW (ref 1.7–7.7)
Neutrophils Relative %: 48 %
Platelets: 212 10*3/uL (ref 150–400)
RBC: 4.35 MIL/uL (ref 3.87–5.11)
RDW: 17.6 % — ABNORMAL HIGH (ref 11.5–15.5)
WBC: 1.1 10*3/uL — CL (ref 4.0–10.5)
nRBC: 0 % (ref 0.0–0.2)

## 2019-01-28 LAB — COMPREHENSIVE METABOLIC PANEL
ALT: 25 U/L (ref 0–44)
AST: 45 U/L — ABNORMAL HIGH (ref 15–41)
Albumin: 3.1 g/dL — ABNORMAL LOW (ref 3.5–5.0)
Alkaline Phosphatase: 125 U/L (ref 38–126)
Anion gap: 11 (ref 5–15)
BUN: 17 mg/dL (ref 6–20)
CO2: 25 mmol/L (ref 22–32)
Calcium: 9.1 mg/dL (ref 8.9–10.3)
Chloride: 97 mmol/L — ABNORMAL LOW (ref 98–111)
Creatinine, Ser: 0.87 mg/dL (ref 0.44–1.00)
GFR calc Af Amer: >60 mL/min (ref >60)
GFR calc non Af Amer: >60 mL/min (ref >60)
Glucose, Bld: 146 mg/dL — ABNORMAL HIGH (ref 70–99)
Potassium: 3.9 mmol/L (ref 3.5–5.1)
Sodium: 133 mmol/L — ABNORMAL LOW (ref 135–145)
Total Bilirubin: 2.3 mg/dL — ABNORMAL HIGH (ref 0.3–1.2)
Total Protein: 7.1 g/dL (ref 6.5–8.1)

## 2019-01-28 LAB — URINALYSIS, ROUTINE W REFLEX MICROSCOPIC
Bacteria, UA: NONE SEEN
Bilirubin Urine: NEGATIVE
Cellular Cast, UA: 7
Glucose, UA: NEGATIVE mg/dL
Hgb urine dipstick: NEGATIVE
Ketones, ur: NEGATIVE mg/dL
Leukocytes,Ua: NEGATIVE
Nitrite: NEGATIVE
Protein, ur: 30 mg/dL — AB
Specific Gravity, Urine: 1.02 (ref 1.005–1.030)
pH: 5 (ref 5.0–8.0)

## 2019-01-28 LAB — SARS CORONAVIRUS 2 BY RT PCR (HOSPITAL ORDER, PERFORMED IN ~~LOC~~ HOSPITAL LAB): SARS Coronavirus 2: NEGATIVE

## 2019-01-28 LAB — LACTIC ACID, PLASMA
Lactic Acid, Venous: 2.1 mmol/L (ref 0.5–1.9)
Lactic Acid, Venous: 3.2 mmol/L (ref 0.5–1.9)
Lactic Acid, Venous: 3.3 mmol/L (ref 0.5–1.9)

## 2019-01-28 LAB — C DIFFICILE QUICK SCREEN W PCR REFLEX
C Diff antigen: NEGATIVE
C Diff interpretation: NOT DETECTED
C Diff toxin: NEGATIVE

## 2019-01-28 LAB — SAMPLE TO BLOOD BANK

## 2019-01-28 LAB — MAGNESIUM: Magnesium: 1.6 mg/dL — ABNORMAL LOW (ref 1.7–2.4)

## 2019-01-28 LAB — LIPASE, BLOOD: Lipase: 18 U/L (ref 11–51)

## 2019-01-28 LAB — MRSA PCR SCREENING: MRSA by PCR: NEGATIVE

## 2019-01-28 IMAGING — CT CT ABDOMEN AND PELVIS WITH CONTRAST
3 of 5 series · 10 of 46 positions shown, 17 images · IV contrast (Isovue)
Comparison: [DATE]

CLINICAL DATA: History of abdominal pain and metastatic disease to
the liver

EXAM:
CT ABDOMEN AND PELVIS WITH CONTRAST
TECHNIQUE: Multidetector CT imaging of the abdomen and pelvis was performed
using the standard protocol following bolus administration of
intravenous contrast.
CONTRAST:  100mL OMNIPAQUE IOHEXOL 300 MG/ML  SOLN

[Series 2: axial st · axial · 0.90mm/px · z∈[-586,-261]mm · 6 of 93 slices shown, 11 images]
[im 14/93  soft-tissue]
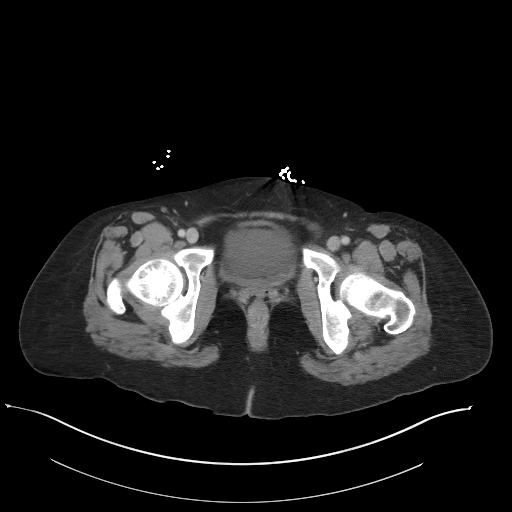
[im 14/93  bone]
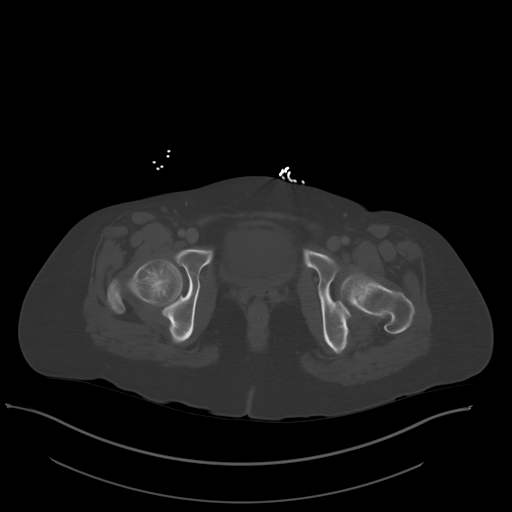
[im 27/93  soft-tissue]
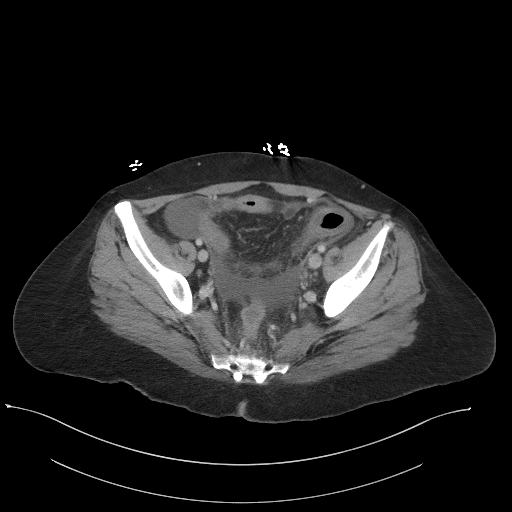
[im 40/93  soft-tissue]
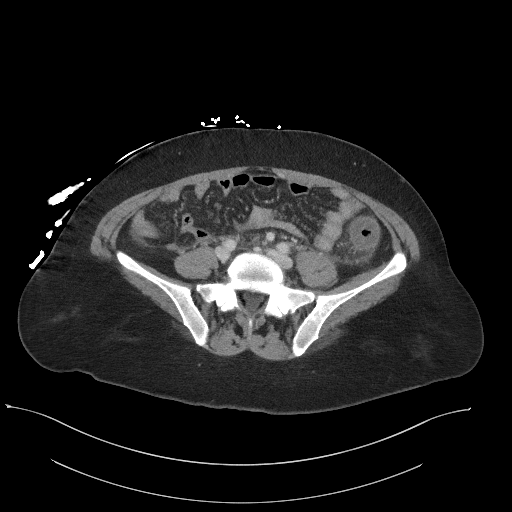
[im 40/93  lung]
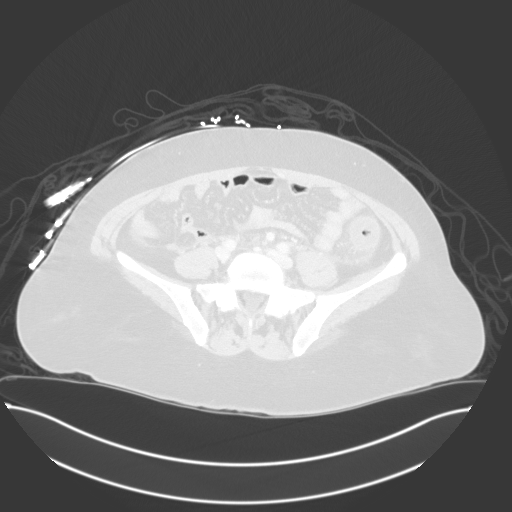
[im 53/93  soft-tissue]
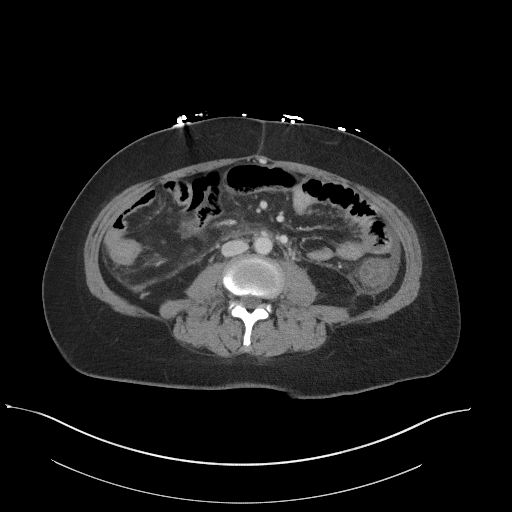
[im 53/93  lung]
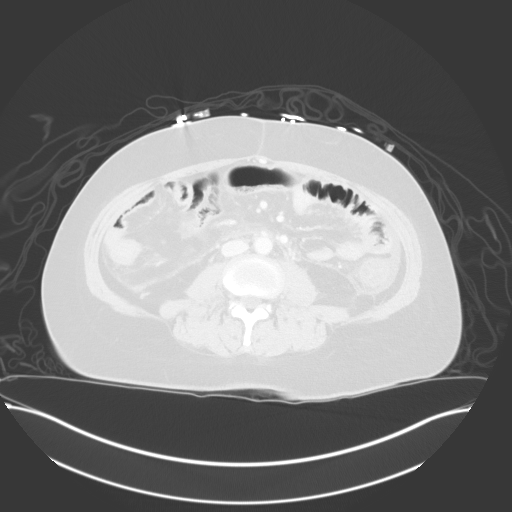
[im 66/93  soft-tissue]
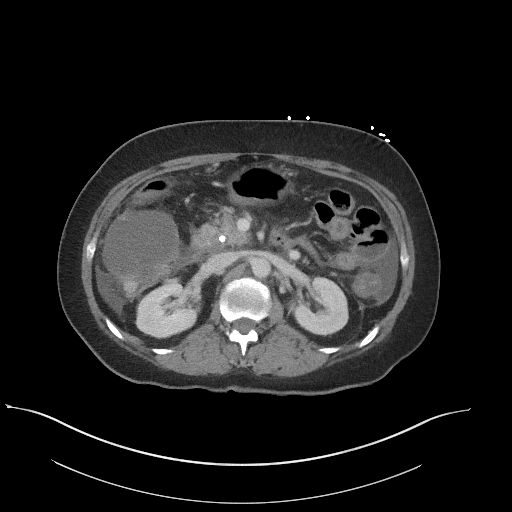
[im 66/93  lung]
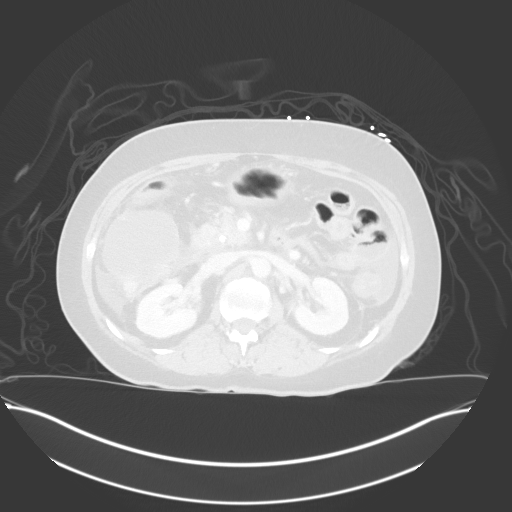
[im 79/93  soft-tissue]
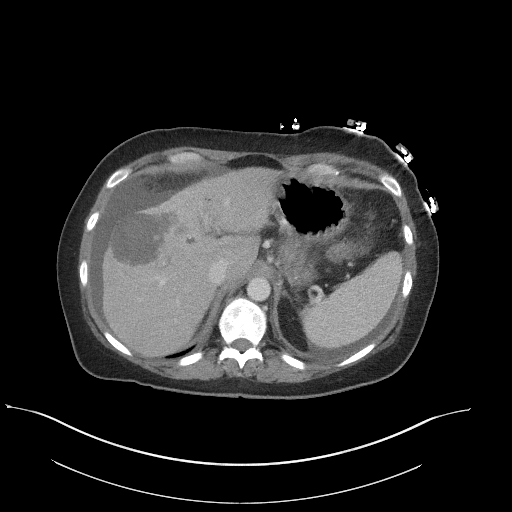
[im 79/93  lung]
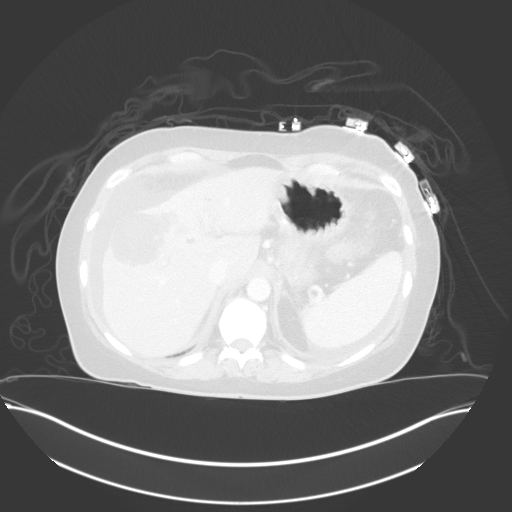

[Series 5: coronal st · coronal · 0.81mm/px · 3 of 91 slices shown, 4 images]
[im 31/91  soft-tissue]
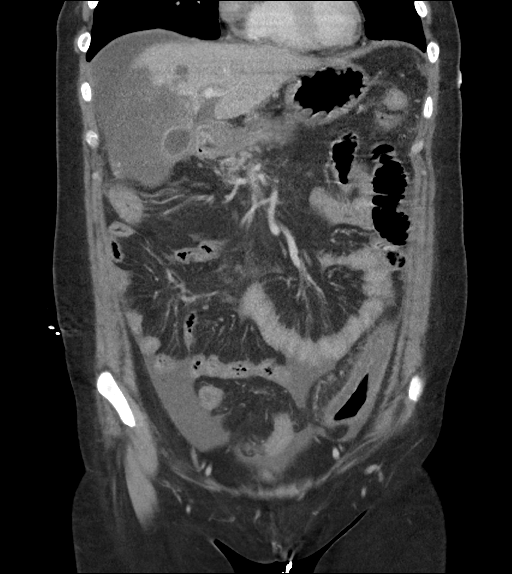
[im 41/91  soft-tissue]
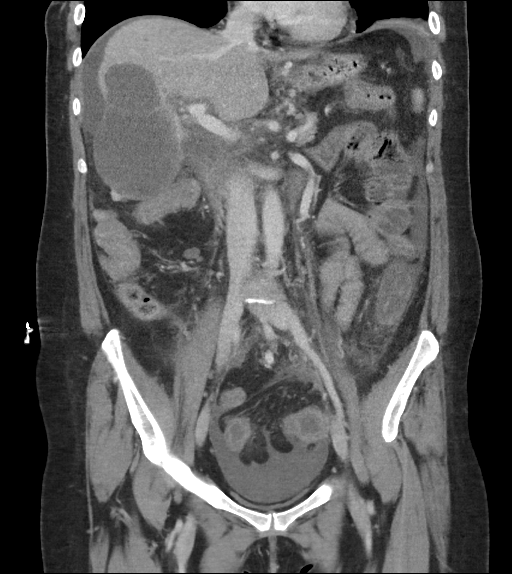
[im 41/91  bone]
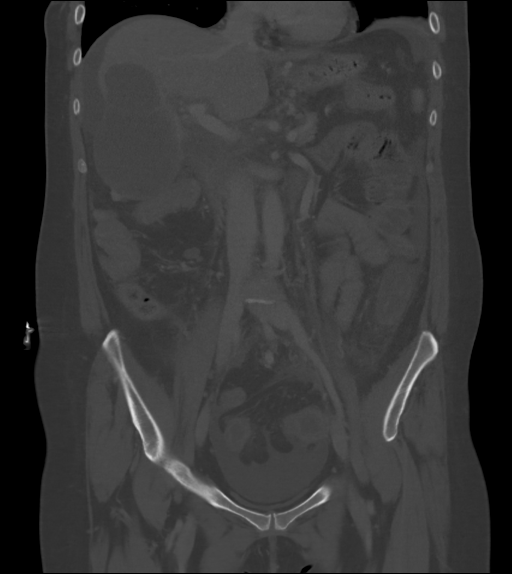
[im 51/91  soft-tissue]
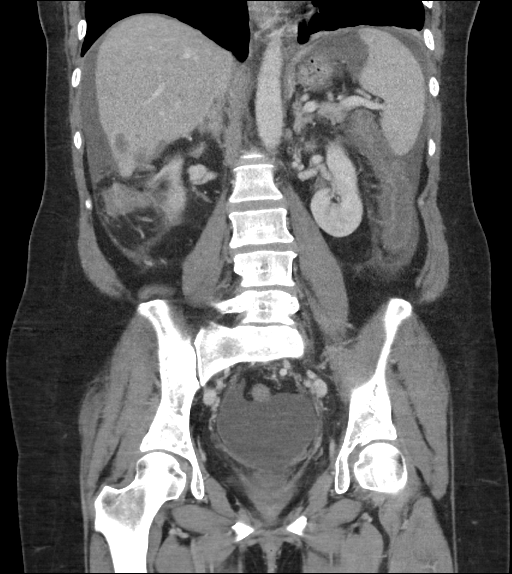

[Series 6: sagittal st · sagittal · 0.59mm/px · 1 of 121 slices shown, 2 images]
[im 41/121  soft-tissue]
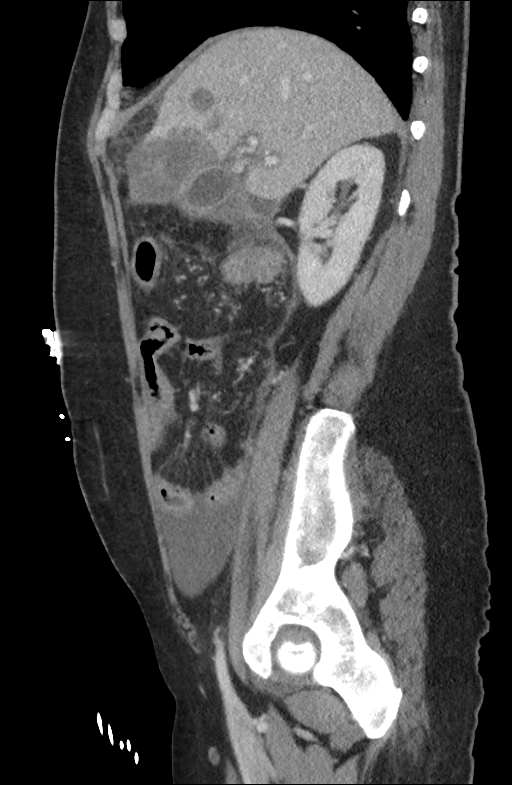
[im 41/121  bone]
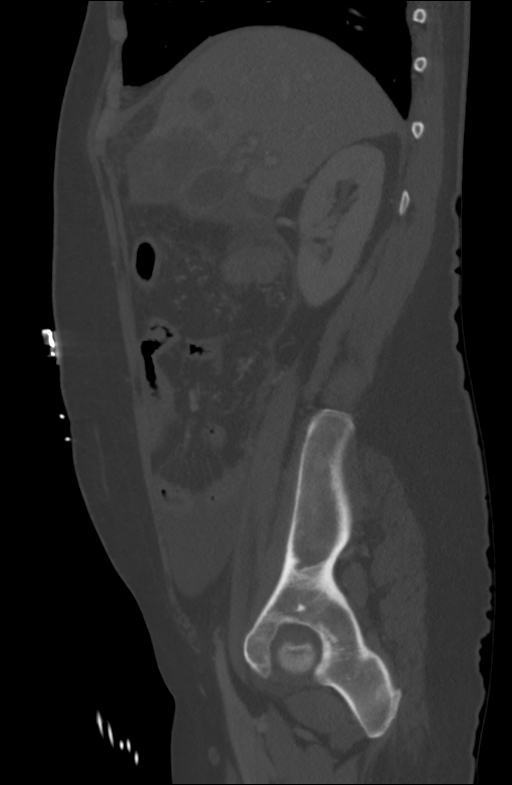

[10 of 46 positions shown; findings below may reference images not displayed]

FINDINGS: Lower chest: Small left pleural effusion is noted slightly decreased
when compare with the prior exam.

Hepatobiliary: Stable metastatic lesion is noted within the liver.
The gallbladder is decompressed. Plastic biliary stent is noted in
place stable from the prior exam. No new focal hepatic lesion is
noted.

Pancreas: Within normal limits.

Spleen: Normal in size without focal abnormality.

Adrenals/Urinary Tract: Adrenal glands are unremarkable. Kidneys
demonstrate a normal enhancement pattern bilaterally. Normal
excretion of contrast is noted on delayed images. No renal calculi
or obstructive changes are seen. The bladder is decompressed.

Stomach/Bowel: The sigmoid and descending colon demonstrates
significant wall thickening consistent with colitis. This begins at
the level of the splenic flexure and extends through the sigmoid. No
perforation is noted. Significant pericolonic inflammatory changes
are noted. The more proximal colon appears within normal limits. The
appendix is unremarkable. No small bowel abnormality is seen. The
stomach is within normal limits.

Vascular/Lymphatic: No significant vascular findings are present. No
enlarged abdominal or pelvic lymph nodes.

Reproductive: Status post hysterectomy. No adnexal masses.

Other: Mild ascites is noted no definitive peritoneal disease is
noted.

Musculoskeletal: No acute or significant osseous findings.
IMPRESSION: Changes consistent with colitis in the descending and sigmoid
colons. No perforation is noted.

Left-sided pleural effusion slightly decreased in the interval from
the prior exam.

Stable hepatic metastatic disease.  Biliary stent is noted in place.

## 2019-01-28 IMAGING — CR PORTABLE CHEST - 1 VIEW
1 series · 2 of 2 positions shown · non-contrast
Comparison: [DATE]

CLINICAL DATA: Nausea and vomiting

EXAM:
PORTABLE CHEST 1 VIEW

[Series 1: ap · 0.17mm/px · 2 of 2 slices shown]
[im 1/2]
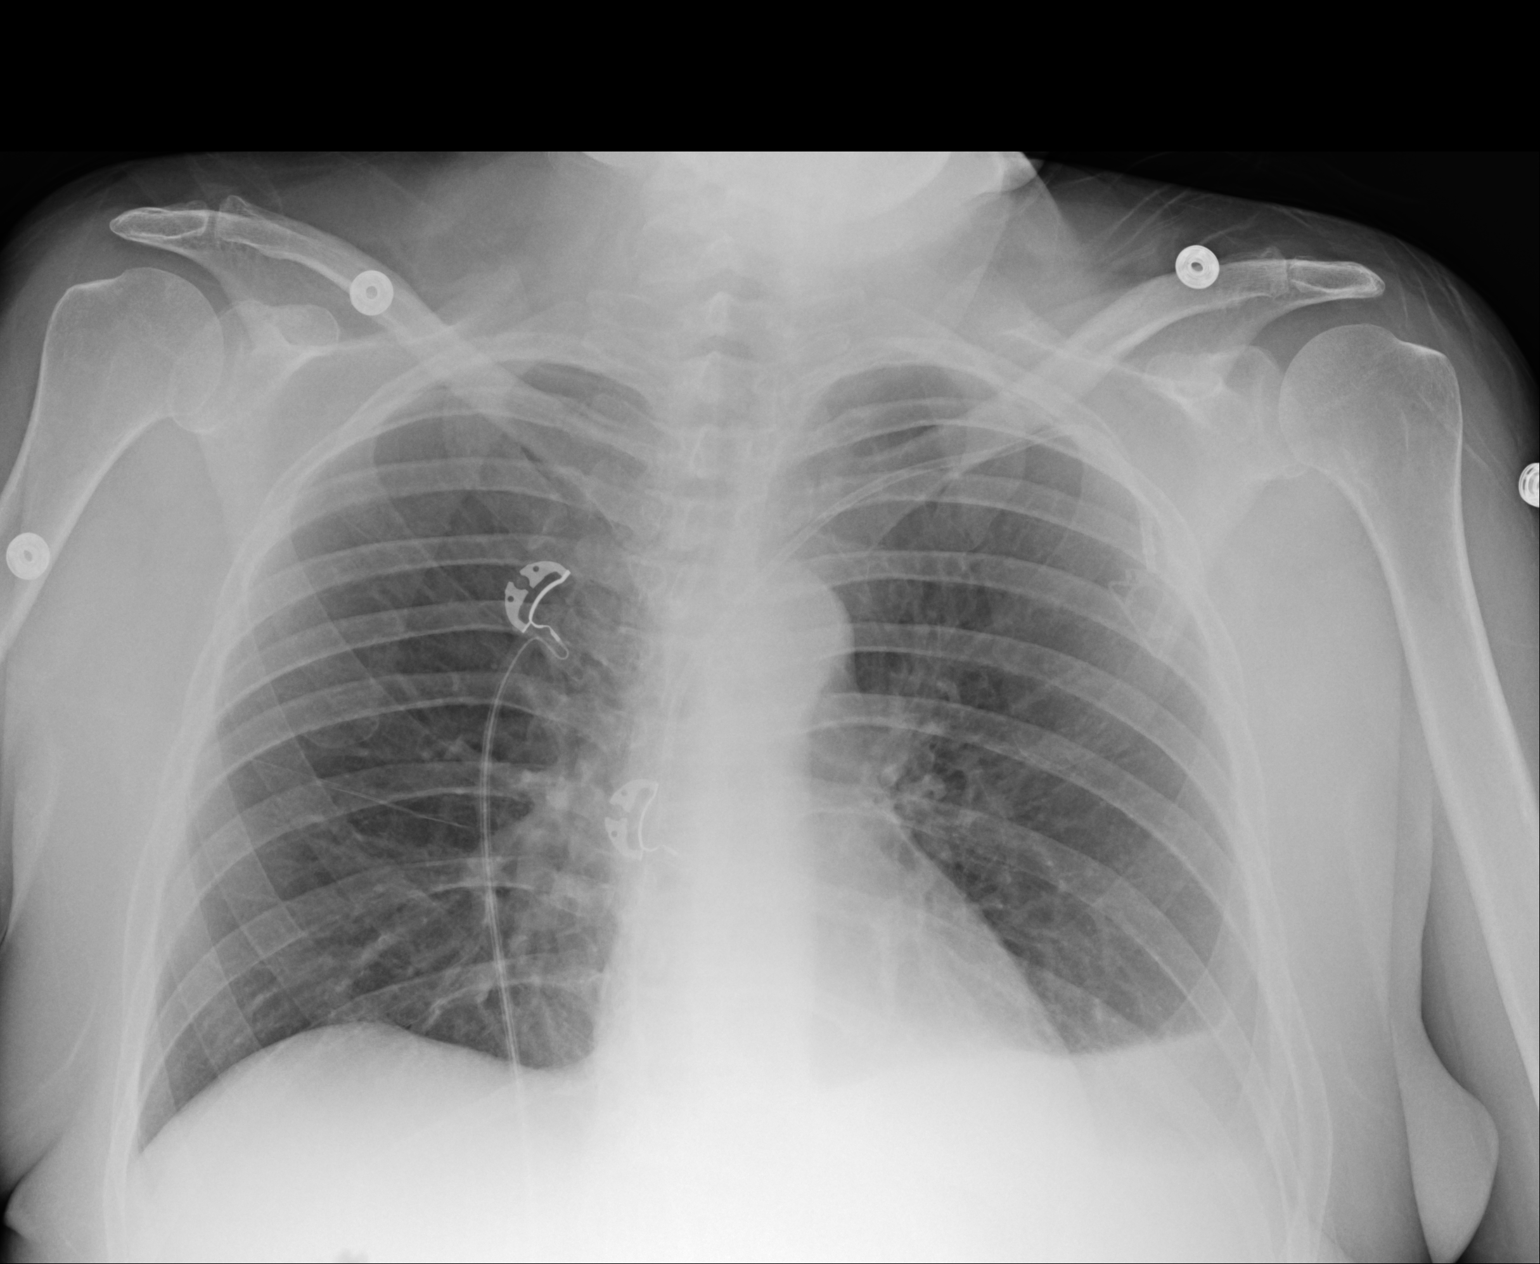
[im 2/2]
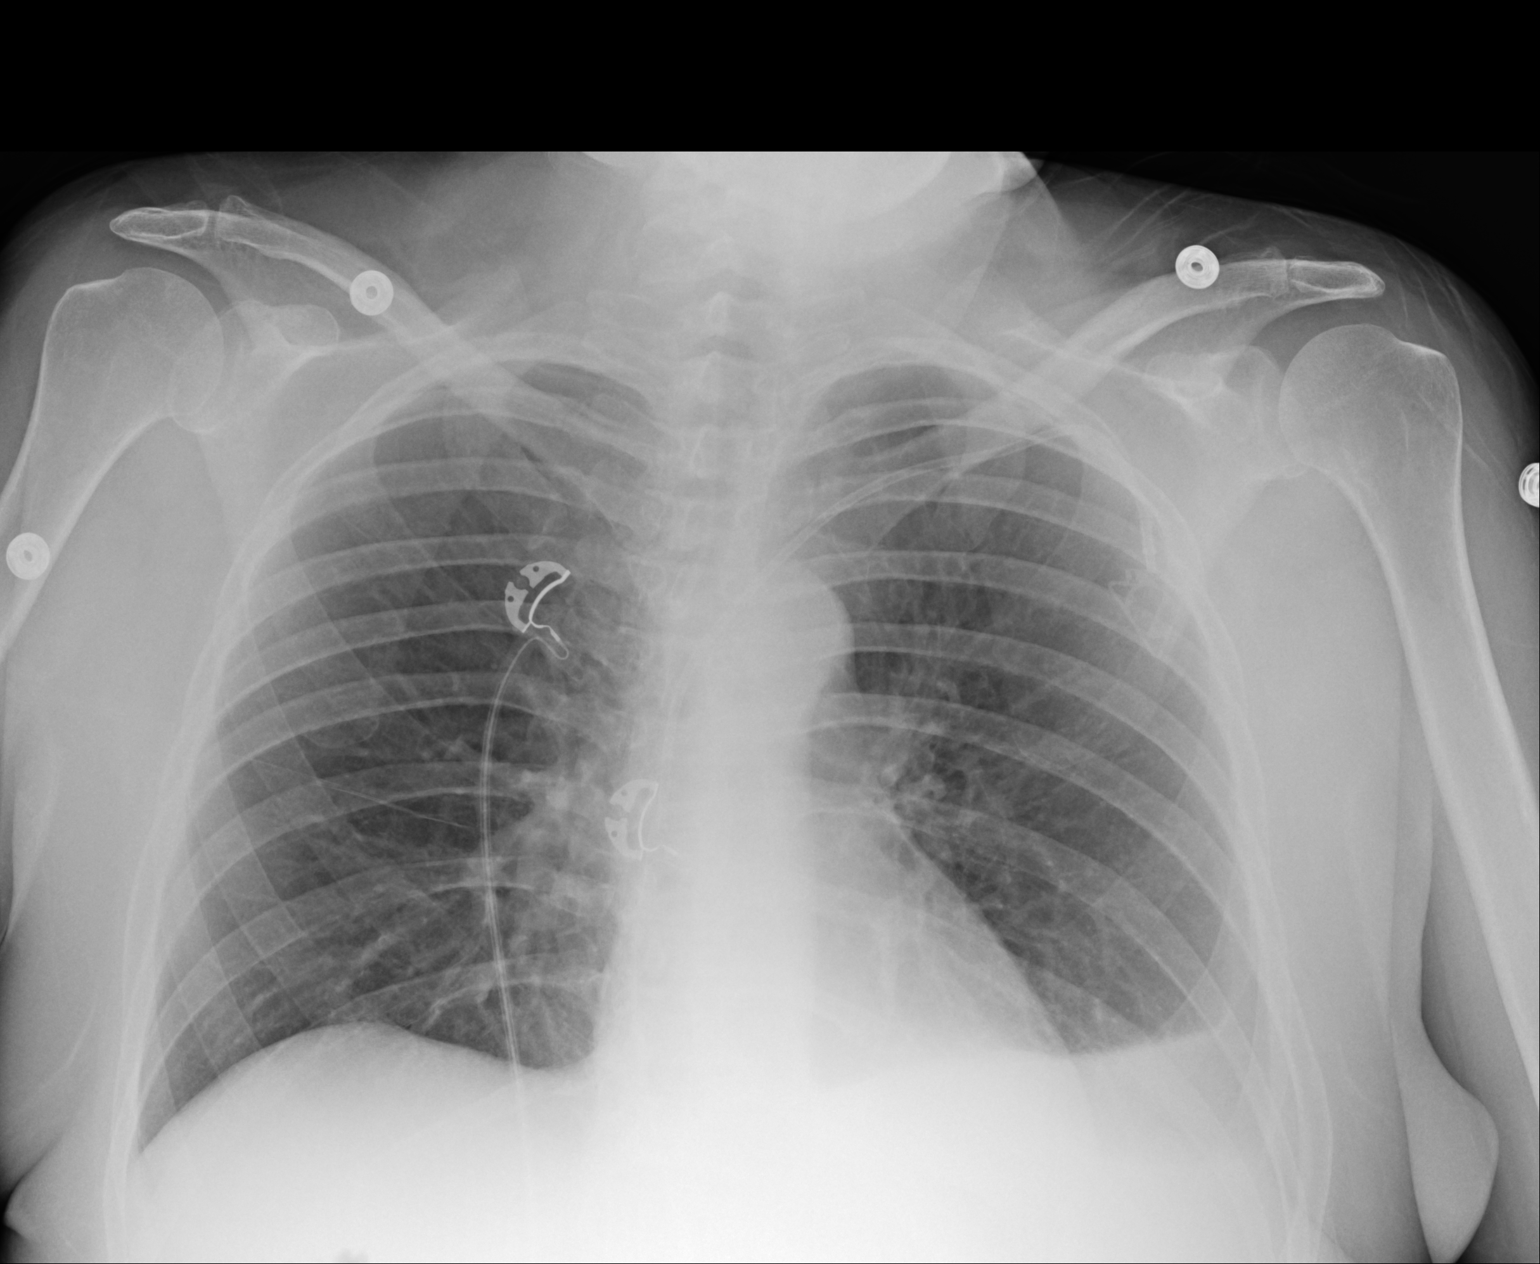

[2 of 2 positions shown; findings below may reference images not displayed]

FINDINGS: Cardiac shadow is within normal limits. The lungs are well aerated
bilaterally with the exception of a small left-sided pleural
effusion. This is stable from the prior exam. Left chest wall port
is again seen and stable. No bony abnormality is seen.
IMPRESSION: Stable left pleural effusion.  No acute abnormality noted.

## 2019-01-28 MED ORDER — ESCITALOPRAM OXALATE 10 MG PO TABS
20.0000 mg | ORAL_TABLET | Freq: Every day | ORAL | Status: DC
  Administered 2019-01-28 – 2019-02-02 (×6): 20 mg via ORAL
  Filled 2019-01-28 (×6): qty 2

## 2019-01-28 MED ORDER — ONDANSETRON HCL 4 MG/2ML IJ SOLN
4.0000 mg | Freq: Once | INTRAMUSCULAR | Status: AC
  Administered 2019-01-28: 4 mg via INTRAVENOUS
  Filled 2019-01-28: qty 2

## 2019-01-28 MED ORDER — METRONIDAZOLE IN NACL 5-0.79 MG/ML-% IV SOLN
500.0000 mg | Freq: Once | INTRAVENOUS | Status: AC
  Administered 2019-01-28: 500 mg via INTRAVENOUS

## 2019-01-28 MED ORDER — TEMAZEPAM 15 MG PO CAPS
15.0000 mg | ORAL_CAPSULE | Freq: Every evening | ORAL | Status: DC | PRN
  Administered 2019-01-28 – 2019-02-01 (×4): 15 mg via ORAL
  Filled 2019-01-28 (×4): qty 1

## 2019-01-28 MED ORDER — PANTOPRAZOLE SODIUM 40 MG PO TBEC
40.0000 mg | DELAYED_RELEASE_TABLET | Freq: Every day | ORAL | Status: DC
  Administered 2019-01-28 – 2019-02-02 (×6): 40 mg via ORAL
  Filled 2019-01-28 (×6): qty 1

## 2019-01-28 MED ORDER — METRONIDAZOLE IN NACL 5-0.79 MG/ML-% IV SOLN
INTRAVENOUS | Status: AC
  Filled 2019-01-28: qty 100

## 2019-01-28 MED ORDER — ACETAMINOPHEN 650 MG RE SUPP: 650.0000 mg | Freq: Four times a day (QID) | RECTAL | Status: DC | PRN

## 2019-01-28 MED ORDER — SCOPOLAMINE 1 MG/3DAYS TD PT72
1.0000 | MEDICATED_PATCH | TRANSDERMAL | Status: DC
  Administered 2019-01-30: 1.5 mg via TRANSDERMAL
  Filled 2019-01-28 (×3): qty 1

## 2019-01-28 MED ORDER — IOHEXOL 300 MG/ML  SOLN
100.0000 mL | Freq: Once | INTRAMUSCULAR | Status: AC | PRN
  Administered 2019-01-28: 100 mL via INTRAVENOUS

## 2019-01-28 MED ORDER — CIPROFLOXACIN IN D5W 400 MG/200ML IV SOLN
INTRAVENOUS | Status: AC
  Filled 2019-01-28: qty 200

## 2019-01-28 MED ORDER — SODIUM CHLORIDE 0.9 % IV SOLN
INTRAVENOUS | Status: DC
  Administered 2019-01-28 (×2): via INTRAVENOUS

## 2019-01-28 MED ORDER — CIPROFLOXACIN IN D5W 400 MG/200ML IV SOLN
400.0000 mg | Freq: Once | INTRAVENOUS | Status: AC
  Administered 2019-01-28: 400 mg via INTRAVENOUS

## 2019-01-28 MED ORDER — ACETAMINOPHEN 325 MG PO TABS
650.0000 mg | ORAL_TABLET | Freq: Four times a day (QID) | ORAL | Status: DC | PRN
  Administered 2019-01-29: 650 mg via ORAL
  Filled 2019-01-28: qty 2

## 2019-01-28 MED ORDER — SODIUM CHLORIDE 0.9 % IV BOLUS
1000.0000 mL | Freq: Once | INTRAVENOUS | Status: AC
  Administered 2019-01-28: 1000 mL via INTRAVENOUS

## 2019-01-28 MED ORDER — FILGRASTIM 480 MCG/1.6ML IJ SOLN
INTRAMUSCULAR | Status: AC
  Filled 2019-01-28: qty 1.6

## 2019-01-28 MED ORDER — MAGNESIUM SULFATE 2 GM/50ML IV SOLN
2.0000 g | Freq: Once | INTRAVENOUS | Status: AC
  Administered 2019-01-28: 2 g via INTRAVENOUS
  Filled 2019-01-28: qty 50

## 2019-01-28 MED ORDER — TBO-FILGRASTIM 480 MCG/0.8ML ~~LOC~~ SOSY
480.0000 ug | PREFILLED_SYRINGE | Freq: Once | SUBCUTANEOUS | Status: AC
  Administered 2019-01-28: 480 ug via SUBCUTANEOUS
  Filled 2019-01-28: qty 0.8

## 2019-01-28 MED ORDER — FENTANYL CITRATE (PF) 100 MCG/2ML IJ SOLN
25.0000 ug | Freq: Once | INTRAMUSCULAR | Status: AC
  Administered 2019-01-28: 25 ug via INTRAVENOUS
  Filled 2019-01-28: qty 2

## 2019-01-28 MED ORDER — ONDANSETRON HCL 4 MG PO TABS: 4.0000 mg | ORAL_TABLET | Freq: Four times a day (QID) | ORAL | Status: DC | PRN

## 2019-01-28 MED ORDER — GABAPENTIN 300 MG PO CAPS
300.0000 mg | ORAL_CAPSULE | Freq: Every day | ORAL | Status: DC
  Administered 2019-01-28 – 2019-02-01 (×5): 300 mg via ORAL
  Filled 2019-01-28 (×5): qty 1

## 2019-01-28 MED ORDER — SODIUM CHLORIDE 0.9 % IV SOLN
Freq: Once | INTRAVENOUS | Status: AC
  Administered 2019-01-28: 05:00:00 via INTRAVENOUS

## 2019-01-28 MED ORDER — FENTANYL CITRATE (PF) 100 MCG/2ML IJ SOLN
25.0000 ug | INTRAMUSCULAR | Status: DC | PRN
  Administered 2019-01-28 – 2019-02-01 (×10): 25 ug via INTRAVENOUS
  Filled 2019-01-28 (×10): qty 2

## 2019-01-28 MED ORDER — METRONIDAZOLE IN NACL 5-0.79 MG/ML-% IV SOLN
500.0000 mg | Freq: Three times a day (TID) | INTRAVENOUS | Status: DC
  Administered 2019-01-28 – 2019-01-31 (×10): 500 mg via INTRAVENOUS
  Filled 2019-01-28 (×9): qty 100

## 2019-01-28 MED ORDER — ONDANSETRON HCL 4 MG/2ML IJ SOLN
4.0000 mg | Freq: Four times a day (QID) | INTRAMUSCULAR | Status: DC | PRN
  Administered 2019-01-28 – 2019-02-01 (×9): 4 mg via INTRAVENOUS
  Filled 2019-01-28 (×9): qty 2

## 2019-01-28 MED ORDER — CIPROFLOXACIN IN D5W 400 MG/200ML IV SOLN
400.0000 mg | Freq: Two times a day (BID) | INTRAVENOUS | Status: DC
  Administered 2019-01-28 – 2019-02-02 (×11): 400 mg via INTRAVENOUS
  Filled 2019-01-28 (×10): qty 200

## 2019-01-28 NOTE — ED Notes (Signed)
Date and time results received: 01/28/19 0326 (use smartphrase ".now" to insert current time)  Test: lactic acid Critical Value: 3.2  Name of Provider Notified: dr Darrick Meigs  Orders Received? Or Actions Taken?: Actions Taken: no orders received

## 2019-01-28 NOTE — Consult Note (Signed)
[facility] Consultation Oncology  Name: Pamela Long      MRN: [id number]    Location: IC06/IC06-01  Date: 01/28/2019 Time:8:22 PM   REFERRING PHYSICIAN: Dr. Wynetta Emery  REASON FOR CONSULT: Metastatic breast cancer.   DIAGNOSIS: Sigmoid colitis.  HISTORY OF PRESENT ILLNESS: Ms. Pamela Long is a 48 year old very pleasant white female well-known to me from my clinic practice.  She had her last cycle of chemotherapy with paclitaxel on 01/21/2019.  Because of cytopenias, she was receiving chemotherapy every 2 weeks.  She came to the ER last night with a fever of 100.2, diarrhea and left lower quadrant pain.  She had about 7-8 loose bowel movements at home and noticed some blood in it.  She was also found to be neutropenic with a white count of 1.1 and ANC below thousand.  She did receive Neupogen dose in the ER.  A CT scan done in the ER of the abdomen and pelvis showed colitis.  She is started on IV hydration with antibiotics including Cipro and Flagyl.  Is also receiving fentanyl as needed for pain.  She tells me that her diarrhea has improved.  No vomiting was reported.  PAST MEDICAL HISTORY:   Past Medical History:  Diagnosis Date  . Anemia   . Anxiety   . Asthma   . Breast cancer ([place])    Left, 2017  . Complication of anesthesia   . Depression   . GERD (gastroesophageal reflux disease)   . Migraine   . OAB (overactive bladder)   . PONV (postoperative nausea and vomiting)   . Seasonal allergies     ALLERGIES: Allergies  Allergen Reactions  . Corn-Containing Products Other (See Comments)    Headache and GI upset  . Salagen [Pilocarpine] Other (See Comments)    Can cause liver failure   . Tape Itching and Other (See Comments)    Depending on the adhesive-blistering occurs  . Amoxicillin Hives and Other (See Comments)    Rash only DID THE REACTION INVOLVE: Swelling of the face/tongue/throat, SOB, or low BP? Sudden or severe rash/hives, skin peeling, or the inside of the  mouth or nose?  Did it require medical treatment?  When did it last happen? If all above answers are "NO", may proceed with cephalosporin use.   . Caffeine Diarrhea, Nausea Only, Palpitations and Other (See Comments)    Headache  . Tetanus Toxoids Swelling and Other (See Comments)    Local reaction      MEDICATIONS: I have reviewed the patient's current medications.     PAST SURGICAL HISTORY Past Surgical History:  Procedure Laterality Date  . ABDOMINAL HYSTERECTOMY     breast cancer  . ANKLE RECONSTRUCTION Right 1989  . BILIARY STENT PLACEMENT N/A 09/22/2018   Procedure: BILIARY STENT PLACEMENT;  Surgeon: Rogene Houston, MD;  [facility];  Service: Endoscopy;  Laterality: N/A;  . BREAST SURGERY    . COLONOSCOPY WITH PROPOFOL N/A 08/06/2018   Procedure: COLONOSCOPY WITH PROPOFOL;  Surgeon: Danie Binder, MD;  [facility];  Service: Endoscopy;  Laterality: N/A;  1:30pm  . ERCP N/A 09/22/2018   Procedure: ENDOSCOPIC RETROGRADE CHOLANGIOPANCREATOGRAPHY (ERCP);  Surgeon: Rogene Houston, MD;  [facility];  Service: Endoscopy;  Laterality: N/A;  . FOREIGN BODY REMOVAL N/A 08/29/2017   from lip  . KNEE SURGERY Right 1989   bone spur  . MASTECTOMY  2017   bil mastectomies  . PORTACATH PLACEMENT Left 12/23/2018  Procedure: INSERTION PORT-A-CATH LEFT SUBCLAVIAN;  Surgeon: Aviva Signs, MD;  Location: [facility];  Service: General;  Laterality: Left;  . SINUSOTOMY      FAMILY HISTORY: Family History  Problem Relation Age of Onset  . Arthritis Mother   . COPD Mother   . Depression Mother   . Diabetes Mother   . Kidney disease Father   . Heart disease Father 80  . Drug abuse Father   . Hypertension Father   . Diabetes Daughter   . Hashimoto's thyroiditis Daughter   . Irritable bowel syndrome Daughter   . Autism spectrum disorder Daughter   . Early death Maternal Grandmother        drowned  . Early death Maternal Grandfather   . Heart  disease Maternal Grandfather   . Heart disease Paternal Grandfather   . Breast cancer Maternal Aunt   . Breast cancer Cousin   . Lung cancer Maternal Aunt   . Lung cancer Maternal Uncle   . Colon cancer Neg Hx     SOCIAL HISTORY:  reports that she has never smoked. She has never used smokeless tobacco. She reports that she does not drink alcohol or use drugs.  PERFORMANCE STATUS: The patient's performance status is 3 - Symptomatic, >50% confined to bed  PHYSICAL EXAM: Most Recent Vital Signs: Blood pressure 122/88, pulse (!) 118, temperature 98.5 F (36.9 C), temperature source Oral, resp. rate 18, height 5\' 6"  (1.676 m), weight 168 lb 3.4 oz (76.3 kg), SpO2 93 %. BP 122/88   Pulse (!) 118   Temp 98.5 F (36.9 C) (Oral)   Resp 18   Ht 5\' 6"  (1.676 m)   Wt 168 lb 3.4 oz (76.3 kg)   SpO2 93%   BMI 27.15 kg/m  General appearance: alert, cooperative and appears stated age Neck: no adenopathy and supple, symmetrical, trachea midline Lungs: clear to auscultation bilaterally Heart: regular rate and rhythm Abdomen: Soft, tenderness in the lower quadrant, left side more than right side. Extremities: 1+ edema bilaterally. Skin: Skin color, texture, turgor normal. No rashes or lesions Lymph nodes: Cervical, supraclavicular, and axillary nodes normal. Neurologic: Grossly normal  LABORATORY DATA:  Results for orders placed or performed during the hospital encounter of 01/27/19 (from the past 48 hour(s))  Comprehensive metabolic panel     Status: Abnormal   Collection Time: 01/27/19 11:30 PM  Result Value Ref Range   Sodium 133 (L) 135 - 145 mmol/L   Potassium 3.9 3.5 - 5.1 mmol/L   Chloride 97 (L) 98 - 111 mmol/L   CO2 25 22 - 32 mmol/L   Glucose, Bld 146 (H) 70 - 99 mg/dL   BUN 17 6 - 20 mg/dL   Creatinine, Ser 0.87 0.44 - 1.00 mg/dL   Calcium 9.1 8.9 - 10.3 mg/dL   Total Protein 7.1 6.5 - 8.1 g/dL   Albumin 3.1 (L) 3.5 - 5.0 g/dL   AST 45 (H) 15 - 41 U/L   ALT 25 0 - 44 U/L    Alkaline Phosphatase 125 38 - 126 U/L   Total Bilirubin 2.3 (H) 0.3 - 1.2 mg/dL   GFR calc non Af Amer >60 >60 mL/min   GFR calc Af Amer >60 >60 mL/min   Anion gap 11 5 - 15    Comment: Performed at [facility], [address], [place], [place] [address]  Lipase, blood     Status: None   Collection Time: 01/27/19 11:30 PM  Result Value Ref Range  Lipase 18 11 - 51 U/L    Comment: Performed at [facility], [address], [place], [place] [address]  CBC with Differential     Status: Abnormal   Collection Time: 01/27/19 11:30 PM  Result Value Ref Range   WBC 1.1 (LL) 4.0 - 10.5 K/uL    Comment: REPEATED TO VERIFY WHITE COUNT CONFIRMED ON SMEAR THIS CRITICAL RESULT HAS VERIFIED AND BEEN CALLED TO K BELTON,RN BY MARIE KELLY ON 08 20 2020 AT 0039, AND HAS BEEN READ BACK.     RBC 4.35 3.87 - 5.11 MIL/uL   Hemoglobin 11.3 (L) 12.0 - 15.0 g/dL   HCT 35.6 (L) 36.0 - 46.0 %   MCV 81.8 80.0 - 100.0 fL   MCH 26.0 26.0 - 34.0 pg   MCHC 31.7 30.0 - 36.0 g/dL   RDW 17.6 (H) 11.5 - 15.5 %   Platelets 212 150 - 400 K/uL   nRBC 0.0 0.0 - 0.2 %   Neutrophils Relative % 48 %   Neutro Abs 0.5 (L) 1.7 - 7.7 K/uL   Lymphocytes Relative 44 %   Lymphs Abs 0.5 (L) 0.7 - 4.0 K/uL   Monocytes Relative 6 %   Monocytes Absolute 0.1 0.1 - 1.0 K/uL   Eosinophils Relative 0 %   Eosinophils Absolute 0.0 0.0 - 0.5 K/uL   Basophils Relative 1 %   Basophils Absolute 0.0 0.0 - 0.1 K/uL   Immature Granulocytes 1 %   Abs Immature Granulocytes 0.01 0.00 - 0.07 K/uL    Comment: Performed at [facility], [address], [place], [place] [address]  Lactic acid, plasma     Status: Abnormal   Collection Time: 01/27/19 11:30 PM  Result Value Ref Range   Lactic Acid, Venous 3.3 (HH) 0.5 - 1.9 mmol/L    Comment: CRITICAL RESULT CALLED TO, READ BACK BY AND VERIFIED WITH: K BELTON,RN @0033  01/28/19 MKELLY Performed at [facility], [address] [place], [place] [address]   Magnesium     Status: Abnormal    Collection Time: 01/27/19 11:30 PM  Result Value Ref Range   Magnesium 1.6 (L) 1.7 - 2.4 mg/dL    Comment: Performed at [facility] [address] [place], [place] [address]  Urinalysis, Routine w reflex microscopic     Status: Abnormal   Collection Time: 01/27/19 11:40 PM  Result Value Ref Range   Color, Urine AMBER (A) YELLOW    Comment: BIOCHEMICALS MAY BE AFFECTED BY COLOR   APPearance HAZY (A) CLEAR   Specific Gravity, Urine 1.020 1.005 - 1.030   pH 5.0 5.0 - 8.0   Glucose, UA NEGATIVE NEGATIVE mg/dL   Hgb urine dipstick NEGATIVE NEGATIVE   Bilirubin Urine NEGATIVE NEGATIVE   Ketones, ur NEGATIVE NEGATIVE mg/dL   Protein, ur 30 (A) NEGATIVE mg/dL   Nitrite NEGATIVE NEGATIVE   Leukocytes,Ua NEGATIVE NEGATIVE   RBC / HPF 0-5 0 - 5 RBC/hpf   WBC, UA 6-10 0 - 5 WBC/hpf   Bacteria, UA NONE SEEN NONE SEEN   Squamous Epithelial / LPF 6-10 0 - 5   Mucus PRESENT    Hyaline Casts, UA PRESENT    Cellular Cast, UA 7    Non Squamous Epithelial 0-5 (A) NONE SEEN    Comment: Performed at [facility], [address], [place], [place] [address]  Culture, blood (routine x 2)     Status: None (Preliminary result)   Collection Time: 01/27/19 11:57 PM   Specimen: BLOOD RIGHT HAND  Result Value Ref Range   Specimen Description BLOOD RIGHT HAND    Special Requests      Blood Culture adequate volume BOTTLES DRAWN AEROBIC AND ANAEROBIC   Culture      NO GROWTH < 12 HOURS Performed at [facility], [address], [place], [place] [address]    Report Status PENDING   Culture, blood (routine x 2)     Status: None (Preliminary result)   Collection Time: 01/28/19 12:11 AM   Specimen: BLOOD RIGHT WRIST  Result Value Ref Range   Specimen Description BLOOD RIGHT WRIST    Special Requests      BOTTLES DRAWN AEROBIC AND ANAEROBIC Blood Culture adequate volume   Culture      NO GROWTH < 12 HOURS Performed at [facility], [address] [place], [place] [address]    Report Status PENDING    Lactic acid, plasma     Status: Abnormal   Collection Time: 01/28/19  2:01 AM  Result Value Ref Range   Lactic Acid, Venous 3.2 (HH) 0.5 - 1.9 mmol/L    Comment: CRITICAL RESULT CALLED TO, READ BACK BY AND VERIFIED WITH: Blanchard Mane @0326  01/28/19 [facility] Performed at [facility], [address], [place], [place] [address]   Sample to Blood Bank     Status: None   Collection Time: 01/28/19  3:16 AM  Result Value Ref Range   Blood Bank Specimen SAMPLE AVAILABLE FOR TESTING    Sample Expiration      01/31/2019,2359 Performed at [facility], [address] [place], [place] [address]   SARS Coronavirus 2 [facility] order, Performed in [facility] hospital lab) Nasopharyngeal STOOL     Status: None   Collection Time: 01/28/19  5:16 AM   Specimen: STOOL; Nasopharyngeal  Result Value Ref Range   SARS Coronavirus 2 NEGATIVE NEGATIVE    Comment: (NOTE) If result is NEGATIVE SARS-CoV-2 target nucleic acids are NOT DETECTED. The SARS-CoV-2 RNA is generally detectable in upper and lower  respiratory specimens during the acute phase of infection. The lowest  concentration of SARS-CoV-2 viral copies this assay can detect is 250  copies / mL. A negative result does not preclude SARS-CoV-2 infection  and should not be used as the sole basis for treatment or other  patient management decisions.  A negative result may occur with  improper specimen collection / handling, submission of specimen other  than nasopharyngeal swab, presence of viral mutation(s) within the  areas targeted by this assay, and inadequate number of viral copies  (<250 copies / mL). A negative result must be combined with clinical  observations, patient history, and epidemiological information. If result is POSITIVE SARS-CoV-2 target nucleic acids are DETECTED. The SARS-CoV-2 RNA is generally detectable in upper and lower  respiratory specimens dur ing the acute phase of infection.  Positive  results are indicative of  active infection with SARS-CoV-2.  Clinical  correlation with patient history and other diagnostic information is  necessary to determine patient infection status.  Positive results do  not rule out bacterial infection or co-infection with other viruses. If result is PRESUMPTIVE POSTIVE SARS-CoV-2 nucleic acids MAY BE PRESENT.   A presumptive positive result was obtained on the submitted specimen  and confirmed on repeat testing.  While 2019 novel coronavirus  (SARS-CoV-2) nucleic acids may be present in the submitted sample  additional confirmatory testing may be necessary for epidemiological  and / or clinical management purposes  to differentiate between  SARS-CoV-2 and other  Sarbecovirus currently known to infect humans.  If clinically indicated additional testing with an alternate test  methodology [phone]) is advised. The SARS-CoV-2 RNA is generally  detectable in upper and lower respiratory sp ecimens during the acute  phase of infection. The expected result is Negative. Fact Sheet for Patients:  StrictlyIdeas.no Fact Sheet for Healthcare Providers: BankingDealers.co.za This test is not yet approved or cleared by the [place] FDA and has been authorized for detection and/or diagnosis of SARS-CoV-2 by FDA under an Emergency Use Authorization (EUA).  This EUA will remain in effect (meaning this test can be used) for the duration of the COVID-19 declaration under Section 564(b)(1) of the Act, 21 U.S.C. section 360bbb-3(b)(1), unless the authorization is terminated or revoked sooner. Performed at [facility], [address], [place], [place] [address]   C difficile quick scan w PCR reflex     Status: None   Collection Time: 01/28/19  5:23 AM   Specimen: STOOL  Result Value Ref Range   C Diff antigen NEGATIVE NEGATIVE   C Diff toxin NEGATIVE NEGATIVE   C Diff interpretation No C. difficile detected.     Comment: Performed at  [facility], [address], [place], [place] [address]  Lactic acid, plasma     Status: Abnormal   Collection Time: 01/28/19 10:06 AM  Result Value Ref Range   Lactic Acid, Venous 2.1 (HH) 0.5 - 1.9 mmol/L    Comment: CRITICAL RESULT CALLED TO, READ BACK BY AND VERIFIED WITH: EASTWOOD,J AT 11:00AM ON 01/28/19 BY [facility] Performed at [facility], [address] [place], [place] [address]   MRSA PCR Screening     Status: None   Collection Time: 01/28/19 12:39 PM   Specimen: Nasal Mucosa; Nasopharyngeal  Result Value Ref Range   MRSA by PCR NEGATIVE NEGATIVE    Comment:        The GeneXpert MRSA Assay (FDA approved for NASAL specimens only), is one component of a comprehensive MRSA colonization surveillance program. It is not intended to diagnose MRSA infection nor to guide or monitor treatment for MRSA infections. Performed at [facility], [address], [place], [place] [address]       RADIOGRAPHY: Ct Abdomen Pelvis W Contrast  Result Date: 01/28/2019 CLINICAL DATA:  History of abdominal pain and metastatic disease to the liver EXAM: CT ABDOMEN AND PELVIS WITH CONTRAST TECHNIQUE: Multidetector CT imaging of the abdomen and pelvis was performed using the standard protocol following bolus administration of intravenous contrast. CONTRAST:  149mL OMNIPAQUE IOHEXOL 300 MG/ML  SOLN COMPARISON:  12/29/2018 FINDINGS: Lower chest: Small left pleural effusion is noted slightly decreased when compare with the prior exam. Hepatobiliary: Stable metastatic lesion is noted within the liver. The gallbladder is decompressed. Plastic biliary stent is noted in place stable from the prior exam. No new focal hepatic lesion is noted. Pancreas: Within normal limits. Spleen: Normal in size without focal abnormality. Adrenals/Urinary Tract: Adrenal glands are unremarkable. Kidneys demonstrate a normal enhancement pattern bilaterally. Normal excretion of contrast is noted on delayed images. No renal  calculi or obstructive changes are seen. The bladder is decompressed. Stomach/Bowel: The sigmoid and descending colon demonstrates significant wall thickening consistent with colitis. This begins at the level of the splenic flexure and extends through the sigmoid. No perforation is noted. Significant pericolonic inflammatory changes are noted. The more proximal colon appears within normal limits. The appendix is unremarkable. No small bowel abnormality is seen. The stomach is within normal limits. Vascular/Lymphatic: No significant vascular  findings are present. No enlarged abdominal or pelvic lymph nodes. Reproductive: Status post hysterectomy. No adnexal masses. Other: Mild ascites is noted no definitive peritoneal disease is noted. Musculoskeletal: No acute or significant osseous findings. IMPRESSION: Changes consistent with colitis in the descending and sigmoid colons. No perforation is noted. Left-sided pleural effusion slightly decreased in the interval from the prior exam. Stable hepatic metastatic disease.  Biliary stent is noted in place. Electronically Signed   By: Inez Catalina M.D.   On: 01/28/2019 10:38   Dg Chest Portable 1 View  Result Date: 01/28/2019 CLINICAL DATA:  Nausea and vomiting EXAM: PORTABLE CHEST 1 VIEW COMPARISON:  12/23/2018 FINDINGS: Cardiac shadow is within normal limits. The lungs are well aerated bilaterally with the exception of a small left-sided pleural effusion. This is stable from the prior exam. Left chest wall port is again seen and stable. No bony abnormality is seen. IMPRESSION: Stable left pleural effusion.  No acute abnormality noted. Electronically Signed   By: Inez Catalina M.D.   On: 01/28/2019 10:07   Dg Abd Portable 1 View  Result Date: 01/28/2019 CLINICAL DATA:  Abdominal pain. The patient is currently on chemotherapy. EXAM: PORTABLE ABDOMEN - 1 VIEW COMPARISON:  CT dated December 29, 2003 FINDINGS: The bowel gas pattern is nonspecific and nonobstructive. There is  a biliary stent in place. No definite pneumatosis or free air. IMPRESSION: Nonobstructive bowel gas pattern. Electronically Signed   By: Constance Holster M.D.   On: 01/28/2019 15:36     ASSESSMENT and PLAN:  1.  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 - Last scans showed improvement of malignancy with response to paclitaxel. - Patient has slightly elevated bilirubin of 2.3.  Will need to repeat the bilirubin tomorrow.  If it remains elevated, we would consult Dr. Laural Golden to look at the plastic stent in the hepatic duct which could be occluded.  2.  Sigmoid colitis: -CT scan in the ER showed descending and sigmoid colitis. -Continue Cipro and Flagyl. - Patient did receive Neupogen 480 mcg 1 dose.  3.  Neutropenia secondary to chemotherapy: - White count on admission was 1.1 with ANC of 900. -She received Neupogen 480 mcg subcu x1 in the ER. -If the ANC is less than 1500, continue Neupogen daily until it is above 1500.  4.  Ascites: -Patient is on spironolactone and Lasix as outpatient. -CT scan showed mild ascites.  Diuretics are on hold at this time.  May resume as outpatient.  All questions were answered. The patient knows to call the clinic with any problems, questions or concerns. We can certainly see the patient much sooner if necessary.    Derek Jack

## 2019-01-28 NOTE — ED Notes (Signed)
Date and time results received: 01/28/19 0032 (use smartphrase ".now" to insert current time)  Test: wbc Critical Value: 1.1  Name of Provider Notified: dr [name]  Orders Received? Or Actions Taken?: Actions Taken: no orders received

## 2019-01-28 NOTE — ED Notes (Signed)
ED TO INPATIENT HANDOFF REPORT  ED Nurse Name and Phone #: Chinmayi Rumer [phone]  S Name/Age/Gender Pamela Long 48 y.o. female Room/Bed: APA09/APA09  Code Status   Code Status: Full Code  Home/SNF/Other Home Patient oriented to: self, place, time and situation Is this baseline? Yes   Triage Complete: Triage complete  Chief Complaint Fever, nausea, vomiting   Triage Note Pt last chemo Friday of last week. Pt states started nausea since Sunday and vomiting and diarrhea started today.    Allergies Allergies  Allergen Reactions  . Corn-Containing Products Other (See Comments)    Headache and GI upset  . Salagen [Pilocarpine] Other (See Comments)    Can cause liver failure   . Tape Itching and Other (See Comments)    Depending on the adhesive-blistering occurs  . Amoxicillin Hives and Other (See Comments)    Rash only DID THE REACTION INVOLVE: Swelling of the face/tongue/throat, SOB, or low BP? Sudden or severe rash/hives, skin peeling, or the inside of the mouth or nose?  Did it require medical treatment?  When did it last happen? If all above answers are "NO", may proceed with cephalosporin use.   . Caffeine Diarrhea, Nausea Only, Palpitations and Other (See Comments)    Headache  . Tetanus Toxoids Swelling and Other (See Comments)    Local reaction    Level of Care/Admitting Diagnosis ED Disposition    ED Disposition Condition [facility] Area: [facility] [id number]  Level of Care: Stepdown [14]  Covid Evaluation: Asymptomatic Screening Protocol (No Symptoms)  Diagnosis: Colitis [id number]  Admitting Physician: Loree Fee  Attending Physician: Oswald Hillock [[id number]]  Estimated length of stay: 3 - 4 days  Certification:: I certify this patient will need inpatient services for at least 2 midnights  PT Class (Do Not Modify): Inpatient [101]  PT Acc Code (Do Not Modify): Private [1]       B Medical/Surgery History Past  Medical History:  Diagnosis Date  . Anemia   . Anxiety   . Asthma   . Breast cancer ([place])    Left, 2017  . Complication of anesthesia   . Depression   . GERD (gastroesophageal reflux disease)   . Migraine   . OAB (overactive bladder)   . PONV (postoperative nausea and vomiting)   . Seasonal allergies    Past Surgical History:  Procedure Laterality Date  . ABDOMINAL HYSTERECTOMY     breast cancer  . ANKLE RECONSTRUCTION Right 1989  . BILIARY STENT PLACEMENT N/A 09/22/2018   Procedure: BILIARY STENT PLACEMENT;  Surgeon: Rogene Houston, MD;  [facility];  Service: Endoscopy;  Laterality: N/A;  . BREAST SURGERY    . COLONOSCOPY WITH PROPOFOL N/A 08/06/2018   Procedure: COLONOSCOPY WITH PROPOFOL;  Surgeon: Danie Binder, MD;  Location: [facility];  Service: Endoscopy;  Laterality: N/A;  1:30pm  . ERCP N/A 09/22/2018   Procedure: ENDOSCOPIC RETROGRADE CHOLANGIOPANCREATOGRAPHY (ERCP);  Surgeon: Rogene Houston, MD;  Location: [facility];  Service: Endoscopy;  Laterality: N/A;  . FOREIGN BODY REMOVAL N/A 08/29/2017   from lip  . KNEE SURGERY Right 1989   bone spur  . MASTECTOMY  2017   bil mastectomies  . PORTACATH PLACEMENT Left 12/23/2018   Procedure: INSERTION PORT-A-CATH LEFT SUBCLAVIAN;  Surgeon: Aviva Signs, MD;  Location: [facility] ORS;  Service: General;  Laterality: Left;  . SINUSOTOMY       A IV Location/Drains/Wounds Patient Lines/Drains/Airways  Status   Active Line/Drains/Airways    Name:   Placement date:   Placement time:   Site:   Days:   Implanted Port 12/23/18 Left Chest   12/23/18    0747    Chest   36   Peripheral IV 12/29/18 Right Antecubital   12/29/18    1456    Antecubital   30   Peripheral IV 01/27/19 Right Antecubital   01/27/19    2252    Antecubital   1   GI Stent 10 Fr.   09/22/18    1319    -   128   Incision (Closed) 12/23/18 Chest Left   12/23/18    0753     36          Intake/Output Last 24 hours  Intake/Output Summary (Last  24 hours) at 01/28/2019 1106 Last data filed at 01/28/2019 0520 Gross per 24 hour  Intake 2616.67 ml  Output -  Net 2616.67 ml    Labs/Imaging Results for orders placed or performed during the hospital encounter of 01/27/19 (from the past 48 hour(s))  Comprehensive metabolic panel     Status: Abnormal   Collection Time: 01/27/19 11:30 PM  Result Value Ref Range   Sodium 133 (L) 135 - 145 mmol/L   Potassium 3.9 3.5 - 5.1 mmol/L   Chloride 97 (L) 98 - 111 mmol/L   CO2 25 22 - 32 mmol/L   Glucose, Bld 146 (H) 70 - 99 mg/dL   BUN 17 6 - 20 mg/dL   Creatinine, Ser 0.87 0.44 - 1.00 mg/dL   Calcium 9.1 8.9 - 10.3 mg/dL   Total Protein 7.1 6.5 - 8.1 g/dL   Albumin 3.1 (L) 3.5 - 5.0 g/dL   AST 45 (H) 15 - 41 U/L   ALT 25 0 - 44 U/L   Alkaline Phosphatase 125 38 - 126 U/L   Total Bilirubin 2.3 (H) 0.3 - 1.2 mg/dL   GFR calc non Af Amer >60 >60 mL/min   GFR calc Af Amer >60 >60 mL/min   Anion gap 11 5 - 15    Comment: Performed at [facility], [address] [place], [place] [address]  Lipase, blood     Status: None   Collection Time: 01/27/19 11:30 PM  Result Value Ref Range   Lipase 18 11 - 51 U/L    Comment: Performed at [facility], [address] [place], [place] [address]  CBC with Differential     Status: Abnormal   Collection Time: 01/27/19 11:30 PM  Result Value Ref Range   WBC 1.1 (LL) 4.0 - 10.5 K/uL    Comment: REPEATED TO VERIFY WHITE COUNT CONFIRMED ON SMEAR THIS CRITICAL RESULT HAS VERIFIED AND BEEN CALLED TO K BELTON,RN BY MARIE KELLY ON 08 20 2020 AT 0039, AND HAS BEEN READ BACK.     RBC 4.35 3.87 - 5.11 MIL/uL   Hemoglobin 11.3 (L) 12.0 - 15.0 g/dL   HCT 35.6 (L) 36.0 - 46.0 %   MCV 81.8 80.0 - 100.0 fL   MCH 26.0 26.0 - 34.0 pg   MCHC 31.7 30.0 - 36.0 g/dL   RDW 17.6 (H) 11.5 - 15.5 %   Platelets 212 150 - 400 K/uL   nRBC 0.0 0.0 - 0.2 %   Neutrophils Relative % 48 %   Neutro Abs 0.5 (L) 1.7 - 7.7 K/uL   Lymphocytes Relative 44 %   Lymphs Abs 0.5 (L)  0.7 - 4.0 K/uL  Monocytes Relative 6 %   Monocytes Absolute 0.1 0.1 - 1.0 K/uL   Eosinophils Relative 0 %   Eosinophils Absolute 0.0 0.0 - 0.5 K/uL   Basophils Relative 1 %   Basophils Absolute 0.0 0.0 - 0.1 K/uL   Immature Granulocytes 1 %   Abs Immature Granulocytes 0.01 0.00 - 0.07 K/uL    Comment: Performed at [facility], [address] [place], [place] [address]  Lactic acid, plasma     Status: Abnormal   Collection Time: 01/27/19 11:30 PM  Result Value Ref Range   Lactic Acid, Venous 3.3 (HH) 0.5 - 1.9 mmol/L    Comment: CRITICAL RESULT CALLED TO, READ BACK BY AND VERIFIED WITH: K BELTON,RN @0033  01/28/19 MKELLY Performed at [facility], [address] [place], [place] [address]   Magnesium     Status: Abnormal   Collection Time: 01/27/19 11:30 PM  Result Value Ref Range   Magnesium 1.6 (L) 1.7 - 2.4 mg/dL    Comment: Performed at [facility], [address], [place], [place] [address]  Urinalysis, Routine w reflex microscopic     Status: Abnormal   Collection Time: 01/27/19 11:40 PM  Result Value Ref Range   Color, Urine AMBER (A) YELLOW    Comment: BIOCHEMICALS MAY BE AFFECTED BY COLOR   APPearance HAZY (A) CLEAR   Specific Gravity, Urine 1.020 1.005 - 1.030   pH 5.0 5.0 - 8.0   Glucose, UA NEGATIVE NEGATIVE mg/dL   Hgb urine dipstick NEGATIVE NEGATIVE   Bilirubin Urine NEGATIVE NEGATIVE   Ketones, ur NEGATIVE NEGATIVE mg/dL   Protein, ur 30 (A) NEGATIVE mg/dL   Nitrite NEGATIVE NEGATIVE   Leukocytes,Ua NEGATIVE NEGATIVE   RBC / HPF 0-5 0 - 5 RBC/hpf   WBC, UA 6-10 0 - 5 WBC/hpf   Bacteria, UA NONE SEEN NONE SEEN   Squamous Epithelial / LPF 6-10 0 - 5   Mucus PRESENT    Hyaline Casts, UA PRESENT    Cellular Cast, UA 7    Non Squamous Epithelial 0-5 (A) NONE SEEN    Comment: Performed at [facility], [address], [place], [place] [address]  Culture, blood (routine x 2)     Status: None (Preliminary result)   Collection Time: 01/27/19 11:57 PM   Specimen:  BLOOD  Result Value Ref Range   Specimen Description BLOOD    Special Requests NONE    Culture      NO GROWTH < 12 HOURS Performed at [facility], [address] [place], [place] [address]    Report Status PENDING   Culture, blood (routine x 2)     Status: None (Preliminary result)   Collection Time: 01/28/19 12:11 AM   Specimen: BLOOD  Result Value Ref Range   Specimen Description BLOOD    Special Requests NONE    Culture      NO GROWTH < 12 HOURS Performed at [facility], [address] [place], [place] [address]    Report Status PENDING   Lactic acid, plasma     Status: Abnormal   Collection Time: 01/28/19  2:01 AM  Result Value Ref Range   Lactic Acid, Venous 3.2 (HH) 0.5 - 1.9 mmol/L    Comment: CRITICAL RESULT CALLED TO, READ BACK BY AND VERIFIED WITH: K BELTON,RN @0326  01/28/19 [facility] Performed at [facility], [address], [place], [place] [address]   Sample to Blood Bank     Status: None   Collection Time: 01/28/19  3:16  AM  Result Value Ref Range   Blood Bank Specimen SAMPLE AVAILABLE FOR TESTING    Sample Expiration      01/31/2019,2359 Performed at [facility], [address] [place], [place] [address]   SARS Coronavirus 2 [facility] order, Performed in [facility] hospital lab) Nasopharyngeal STOOL     Status: None   Collection Time: 01/28/19  5:16 AM   Specimen: STOOL; Nasopharyngeal  Result Value Ref Range   SARS Coronavirus 2 NEGATIVE NEGATIVE    Comment: (NOTE) If result is NEGATIVE SARS-CoV-2 target nucleic acids are NOT DETECTED. The SARS-CoV-2 RNA is generally detectable in upper and lower  respiratory specimens during the acute phase of infection. The lowest  concentration of SARS-CoV-2 viral copies this assay can detect is 250  copies / mL. A negative result does not preclude SARS-CoV-2 infection  and should not be used as the sole basis for treatment or other  patient management decisions.  A negative result may occur with  improper  specimen collection / handling, submission of specimen other  than nasopharyngeal swab, presence of viral mutation(s) within the  areas targeted by this assay, and inadequate number of viral copies  (<250 copies / mL). A negative result must be combined with clinical  observations, patient history, and epidemiological information. If result is POSITIVE SARS-CoV-2 target nucleic acids are DETECTED. The SARS-CoV-2 RNA is generally detectable in upper and lower  respiratory specimens dur ing the acute phase of infection.  Positive  results are indicative of active infection with SARS-CoV-2.  Clinical  correlation with patient history and other diagnostic information is  necessary to determine patient infection status.  Positive results do  not rule out bacterial infection or co-infection with other viruses. If result is PRESUMPTIVE POSTIVE SARS-CoV-2 nucleic acids MAY BE PRESENT.   A presumptive positive result was obtained on the submitted specimen  and confirmed on repeat testing.  While 2019 novel coronavirus  (SARS-CoV-2) nucleic acids may be present in the submitted sample  additional confirmatory testing may be necessary for epidemiological  and / or clinical management purposes  to differentiate between  SARS-CoV-2 and other Sarbecovirus currently known to infect humans.  If clinically indicated additional testing with an alternate test  methodology [phone]) is advised. The SARS-CoV-2 RNA is generally  detectable in upper and lower respiratory sp ecimens during the acute  phase of infection. The expected result is Negative. Fact Sheet for Patients:  StrictlyIdeas.no Fact Sheet for Healthcare Providers: BankingDealers.co.za This test is not yet approved or cleared by the [place] FDA and has been authorized for detection and/or diagnosis of SARS-CoV-2 by FDA under an Emergency Use Authorization (EUA).  This EUA will remain in  effect (meaning this test can be used) for the duration of the COVID-19 declaration under Section 564(b)(1) of the Act, 21 U.S.C. section 360bbb-3(b)(1), unless the authorization is terminated or revoked sooner. Performed at [facility], [address] [place], [place] [address]   C difficile quick scan w PCR reflex     Status: None   Collection Time: 01/28/19  5:23 AM   Specimen: STOOL  Result Value Ref Range   C Diff antigen NEGATIVE NEGATIVE   C Diff toxin NEGATIVE NEGATIVE   C Diff interpretation No C. difficile detected.     Comment: Performed at [facility], [address] [place], [place] [address]  Lactic acid, plasma     Status: Abnormal   Collection Time: 01/28/19 10:06 AM  Result Value Ref Range  Lactic Acid, Venous 2.1 (HH) 0.5 - 1.9 mmol/L    Comment: CRITICAL RESULT CALLED TO, READ BACK BY AND VERIFIED WITH: EASTWOOD,J AT 11:00AM ON 01/28/19 BY [facility] Performed at [facility], [address] [place], [place] [address]    Ct Abdomen Pelvis W Contrast  Result Date: 01/28/2019 CLINICAL DATA:  History of abdominal pain and metastatic disease to the liver EXAM: CT ABDOMEN AND PELVIS WITH CONTRAST TECHNIQUE: Multidetector CT imaging of the abdomen and pelvis was performed using the standard protocol following bolus administration of intravenous contrast. CONTRAST:  18mL OMNIPAQUE IOHEXOL 300 MG/ML  SOLN COMPARISON:  12/29/2018 FINDINGS: Lower chest: Small left pleural effusion is noted slightly decreased when compare with the prior exam. Hepatobiliary: Stable metastatic lesion is noted within the liver. The gallbladder is decompressed. Plastic biliary stent is noted in place stable from the prior exam. No new focal hepatic lesion is noted. Pancreas: Within normal limits. Spleen: Normal in size without focal abnormality. Adrenals/Urinary Tract: Adrenal glands are unremarkable. Kidneys demonstrate a normal enhancement pattern bilaterally. Normal excretion of contrast is noted  on delayed images. No renal calculi or obstructive changes are seen. The bladder is decompressed. Stomach/Bowel: The sigmoid and descending colon demonstrates significant wall thickening consistent with colitis. This begins at the level of the splenic flexure and extends through the sigmoid. No perforation is noted. Significant pericolonic inflammatory changes are noted. The more proximal colon appears within normal limits. The appendix is unremarkable. No small bowel abnormality is seen. The stomach is within normal limits. Vascular/Lymphatic: No significant vascular findings are present. No enlarged abdominal or pelvic lymph nodes. Reproductive: Status post hysterectomy. No adnexal masses. Other: Mild ascites is noted no definitive peritoneal disease is noted. Musculoskeletal: No acute or significant osseous findings. IMPRESSION: Changes consistent with colitis in the descending and sigmoid colons. No perforation is noted. Left-sided pleural effusion slightly decreased in the interval from the prior exam. Stable hepatic metastatic disease.  Biliary stent is noted in place. Electronically Signed   By: Inez Catalina M.D.   On: 01/28/2019 10:38   Dg Chest Portable 1 View  Result Date: 01/28/2019 CLINICAL DATA:  Nausea and vomiting EXAM: PORTABLE CHEST 1 VIEW COMPARISON:  12/23/2018 FINDINGS: Cardiac shadow is within normal limits. The lungs are well aerated bilaterally with the exception of a small left-sided pleural effusion. This is stable from the prior exam. Left chest wall port is again seen and stable. No bony abnormality is seen. IMPRESSION: Stable left pleural effusion.  No acute abnormality noted. Electronically Signed   By: Inez Catalina M.D.   On: 01/28/2019 10:07    Pending Labs Unresulted Labs (From admission, onward)    Start     Ordered   01/29/19 0500  CBC  Tomorrow morning,   R     01/28/19 0603   01/29/19 0500  Comprehensive metabolic panel  Tomorrow morning,   R     01/28/19 0603    01/29/19 0500  Magnesium  Tomorrow morning,   R     01/28/19 0603   01/28/19 0109  Gastrointestinal Panel by PCR , Stool  (Gastrointestinal Panel by PCR, Stool)  Once,   STAT     01/28/19 0108          Vitals/Pain Today's Vitals   01/28/19 0900 01/28/19 0930 01/28/19 1000 01/28/19 1030  BP: 105/71 113/75 105/62 107/63  Pulse: (!) 115 (!) 121 (!) 111 (!) 118  Resp: 14 14 17 17   Temp:  TempSrc:      SpO2: 94% 95% 94% 97%  Weight:      PainSc:        Isolation Precautions Enteric precautions (UV disinfection)  Medications Medications  escitalopram (LEXAPRO) tablet 20 mg (20 mg Oral Given 01/28/19 1006)  temazepam (RESTORIL) capsule 15 mg (has no administration in time range)  pantoprazole (PROTONIX) EC tablet 40 mg (40 mg Oral Given 01/28/19 1006)  scopolamine (TRANSDERM-SCOP) 1 MG/3DAYS 1.5 mg (1.5 mg Transdermal Not Given 01/28/19 [id number])  gabapentin (NEURONTIN) capsule 300 mg (has no administration in time range)  0.9 %  sodium chloride infusion ( Intravenous Transfusing/Transfer 01/28/19 1007)  acetaminophen (TYLENOL) tablet 650 mg (has no administration in time range)    Or  acetaminophen (TYLENOL) suppository 650 mg (has no administration in time range)  ondansetron (ZOFRAN) tablet 4 mg ( Oral See Alternative 01/28/19 0747)    Or  ondansetron (ZOFRAN) injection 4 mg (4 mg Intravenous Given 01/28/19 0747)  fentaNYL (SUBLIMAZE) injection 25 mcg (25 mcg Intravenous Given 01/28/19 0748)  ciprofloxacin (CIPRO) IVPB 400 mg (0 mg Intravenous Stopped 01/28/19 0330)  metroNIDAZOLE (FLAGYL) IVPB 500 mg (0 mg Intravenous Stopped 01/28/19 0430)  sodium chloride 0.9 % bolus 1,000 mL (0 mLs Intravenous Stopped 01/28/19 0040)  sodium chloride 0.9 % bolus 1,000 mL (0 mLs Intravenous Stopped 01/28/19 0520)  ondansetron (ZOFRAN) injection 4 mg (4 mg Intravenous Given 01/27/19 2338)  fentaNYL (SUBLIMAZE) injection 50 mcg (50 mcg Intravenous Given 01/27/19 2337)  Tbo-Filgrastim (GRANIX) injection  480 mcg (480 mcg Subcutaneous Given 01/28/19 0205)  magnesium sulfate IVPB 2 g 50 mL (0 g Intravenous Stopped 01/28/19 0226)  iohexol (OMNIPAQUE) 300 MG/ML solution 100 mL (100 mLs Intravenous Contrast Given 01/28/19 0200)  sodium chloride 0.9 % bolus 1,000 mL (0 mLs Intravenous Stopped 01/28/19 0521)  ondansetron (ZOFRAN) injection 4 mg (4 mg Intravenous Given 01/28/19 0205)  ciprofloxacin (CIPRO) IVPB 400 mg (0 mg Intravenous Stopped 01/28/19 0330)  metroNIDAZOLE (FLAGYL) IVPB 500 mg (0 mg Intravenous Stopped 01/28/19 0430)  fentaNYL (SUBLIMAZE) injection 25 mcg (25 mcg Intravenous Given 01/28/19 0535)  0.9 %  sodium chloride infusion ( Intravenous Stopped 01/28/19 0605)  filgrastim (NEUPOGEN) 480 MCG/1.6ML injection (has no administration in time range)    Mobility walks Low fall risk   Focused Assessments    R Recommendations: See Admitting Provider Note  Report given to:   Additional Notes:

## 2019-01-28 NOTE — ED Notes (Signed)
Date and time results received: 01/28/19 0032 (use smartphrase ".now" to insert current time)  Test: lactic acid Critical Value: 3.3  Name of Provider Notified: dr [name]  Orders Received? Or Actions Taken?: Actions Taken: no orders received

## 2019-01-28 NOTE — H&P (Signed)
TRH H&P    Patient Demographics:    Pamela Long, is a 48 y.o. female  MRN: [id number]  DOB - 1971-01-16  Admit Date - 01/27/2019  Referring MD/NP/PA: Aris Georgia  Outpatient Primary MD for the patient is Doree Albee, MD  Patient coming from: Home  Chief complaint-diarrhea   HPI:    Pamela Long  is a 48 y.o. female, with history of metastatic breast cancer dating back to 2017 when she received neoadjuvant chemotherapy followed by mastectomy and antiestrogen therapy.  Therapy was discontinued because of side effects.  Patient again presented to the ED in December 2019 with nausea vomiting and found to have large liver mass.  The mass was biopsied at [facility] in June 26, 2018 and turned out to be metastatic breast cancer.  She was started on chemotherapy by oncology.  She had an MRCP on 09/18/2018 which revealed very large liver lesion along with small lesion consistent with metastatic disease.  Study also showed dilated biliary system with cutoff in the region of proximal CBD consistent with stricture most likely due to adenopathy.  She had plastic stent placed in the hepatic duct with good drainage of bile on 09/22/2018.  Today patient came to the hospital with nausea vomiting and diarrhea.  Patient says that she had 7-8 loose BM at home.  She also noticed blood in the stool.  CT scan of the abdomen pelvis showed descending and sigmoid colitis.  Patient started on ciprofloxacin and Flagyl.  Lab work also showed neutropenia with WBC 1.1, patient received Neupogen 40 mcg subcutaneous x1 as per oncology Dr. [name] recommendation.  She denies chest pain or shortness of breath. Complains of cough with clear phlegm. Denies dysuria Complains of abdominal pain No previous history of stroke or seizures.    Review of systems:    In addition to the HPI above,    All other systems reviewed and are  negative.    Past History of the following :    Past Medical History:  Diagnosis Date  . Anemia   . Anxiety   . Asthma   . Breast cancer ([place])    Left, 2017  . Complication of anesthesia   . Depression   . GERD (gastroesophageal reflux disease)   . Migraine   . OAB (overactive bladder)   . PONV (postoperative nausea and vomiting)   . Seasonal allergies       Past Surgical History:  Procedure Laterality Date  . ABDOMINAL HYSTERECTOMY     breast cancer  . ANKLE RECONSTRUCTION Right 1989  . BILIARY STENT PLACEMENT N/A 09/22/2018   Procedure: BILIARY STENT PLACEMENT;  Surgeon: Rogene Houston, MD;  [facility];  Service: Endoscopy;  Laterality: N/A;  . BREAST SURGERY    . COLONOSCOPY WITH PROPOFOL N/A 08/06/2018   Procedure: COLONOSCOPY WITH PROPOFOL;  Surgeon: Danie Binder, MD;  Location: [facility];  Service: Endoscopy;  Laterality: N/A;  1:30pm  . ERCP N/A 09/22/2018   Procedure: ENDOSCOPIC RETROGRADE CHOLANGIOPANCREATOGRAPHY (ERCP);  Surgeon: Hildred Laser  U, MD;  Location: [facility];  Service: Endoscopy;  Laterality: N/A;  . FOREIGN BODY REMOVAL N/A 08/29/2017   from lip  . KNEE SURGERY Right 1989   bone spur  . MASTECTOMY  2017   bil mastectomies  . PORTACATH PLACEMENT Left 12/23/2018   Procedure: INSERTION PORT-A-CATH LEFT SUBCLAVIAN;  Surgeon: Aviva Signs, MD;  Location: [facility] ORS;  Service: General;  Laterality: Left;  . SINUSOTOMY        Social History:      Social History   Tobacco Use  . Smoking status: Never Smoker  . Smokeless tobacco: Never Used  Substance Use Topics  . Alcohol use: No       Family History :     Family History  Problem Relation Age of Onset  . Arthritis Mother   . COPD Mother   . Depression Mother   . Diabetes Mother   . Kidney disease Father   . Heart disease Father 70  . Drug abuse Father   . Hypertension Father   . Diabetes Daughter   . Hashimoto's thyroiditis Daughter   . Irritable bowel  syndrome Daughter   . Autism spectrum disorder Daughter   . Early death Maternal Grandmother        drowned  . Early death Maternal Grandfather   . Heart disease Maternal Grandfather   . Heart disease Paternal Grandfather   . Breast cancer Maternal Aunt   . Breast cancer Cousin   . Lung cancer Maternal Aunt   . Lung cancer Maternal Uncle   . Colon cancer Neg Hx       Home Medications:   Prior to Admission medications   Medication Sig Start Date End Date Taking? Authorizing Provider  acetaminophen (TYLENOL) 325 MG tablet Take 2 tablets (650 mg total) by mouth every 6 (six) hours as needed for mild pain, fever or headache (T over 101). 12/16/18   Emokpae, Courage, MD  b complex vitamins capsule Take 1 capsule by mouth daily.    [provider]  Cholecalciferol (VITAMIN D3 GUMMIES) 25 MCG (1000 UT) CHEW Chew 2,000-4,000 Units by mouth daily.    [provider]  escitalopram (LEXAPRO) 20 MG tablet Take 1 tablet (20 mg total) by mouth daily. 02/12/17   Raylene Everts, MD  furosemide (LASIX) 20 MG tablet TAKE 1 TABLET BY MOUTH EVERY DAY 01/21/19   Derek Jack, MD  gabapentin (NEURONTIN) 300 MG capsule Take 1 capsule (300 mg total) by mouth at bedtime. 01/07/19   Derek Jack, MD  lidocaine-prilocaine (EMLA) cream Apply 1 application topically as needed. 12/24/18   Derek Jack, MD  Lysine 1000 MG TABS Take 1,000 mg by mouth daily.    [provider]  magic mouthwash w/lidocaine SOLN Take 5 mLs by mouth 4 (four) times daily as needed for mouth pain. 12/06/45   Delora Fuel, MD  magnesium oxide (MAG-OX) 400 MG tablet TAKE 1 TABLET BY MOUTH EVERY DAY 12/21/18   Lockamy, Randi L, NP-C  nystatin (MYCOSTATIN) 100000 UNIT/ML suspension Take 5 mLs (500,000 Units total) by mouth 4 (four) times daily. Swish and gargle - then either spit or swallow 6/54/65   Delora Fuel, MD  ondansetron (ZOFRAN-ODT) 8 MG disintegrating tablet Take 1 tablet (8 mg total) by  mouth every 8 (eight) hours as needed for nausea or vomiting. 01/21/19   Derek Jack, MD  oxyCODONE-acetaminophen (PERCOCET) 5-325 MG tablet Take 1 tablet by mouth every 4 (four) hours as needed for moderate pain. 12/16/18  Roxan Hockey, MD  PACLitaxel (TAXOL IV) Inject into the vein once a week.     [provider]  pantoprazole (PROTONIX) 40 MG tablet TAKE 1 TABLET BY MOUTH EVERY DAY BEFORE BREAKFAST 11/19/18   Annitta Needs, NP  Polyvinyl Alcohol-Povidone PF (REFRESH) 1.4-0.6 % SOLN Place 1 drop into both eyes as needed (for dry eyes).     [provider]  potassium chloride SA (K-DUR) 20 MEQ tablet Take 2 tablets (40 mEq total) by mouth daily. 12/16/18   Roxan Hockey, MD  prochlorperazine (COMPAZINE) 10 MG tablet Take 1 tablet (10 mg total) by mouth every 6 (six) hours as needed for nausea or vomiting. 01/07/19   Derek Jack, MD  promethazine (PHENERGAN) 25 MG tablet Take 1 tablet (25 mg total) by mouth every 6 (six) hours as needed for nausea or vomiting. 10/02/18   Derek Jack, MD  rizatriptan (MAXALT-MLT) 10 MG disintegrating tablet Take 10 mg by mouth every 2 (two) hours as needed for migraine.     [provider]  scopolamine (TRANSDERM-SCOP) 1 MG/3DAYS Place 1 patch (1.5 mg total) onto the skin every 3 (three) days. 01/18/19   Derek Jack, MD  senna-docusate (SENOKOT-S) 8.6-50 MG tablet Take 2 tablets by mouth at bedtime as needed for mild constipation or moderate constipation. 12/16/18 12/16/19  Roxan Hockey, MD  spironolactone (ALDACTONE) 25 MG tablet TAKE 1 TABLET BY MOUTH EVERY DAY 01/21/19   Derek Jack, MD  temazepam (RESTORIL) 15 MG capsule Take 1 capsule (15 mg total) by mouth at bedtime as needed for sleep. 12/03/18   Derek Jack, MD     Allergies:     Allergies  Allergen Reactions  . Corn-Containing Products Other (See Comments)    Headache and GI upset  . Salagen [Pilocarpine] Other (See  Comments)    Can cause liver failure   . Tape Itching and Other (See Comments)    Depending on the adhesive-blistering occurs  . Amoxicillin Hives and Other (See Comments)    Rash only DID THE REACTION INVOLVE: Swelling of the face/tongue/throat, SOB, or low BP? Sudden or severe rash/hives, skin peeling, or the inside of the mouth or nose?  Did it require medical treatment?  When did it last happen? If all above answers are "NO", may proceed with cephalosporin use.   . Caffeine Diarrhea, Nausea Only, Palpitations and Other (See Comments)    Headache  . Tetanus Toxoids Swelling and Other (See Comments)    Local reaction     Physical Exam:   Vitals  Blood pressure 112/66, pulse (!) 122, temperature 98.9 F (37.2 C), temperature source Oral, resp. rate 17, weight 76.6 kg, SpO2 97 %.  1.  General: Appears in no acute distress  2. Psychiatric: Alert, oriented x3, intact insight and judgment  3. Neurologic: Cranial nerves II through XII grossly intact, motor strength 5/5 in all extremities  4. HEENMT:  Atraumatic normocephalic, extraocular muscles are intact  5. Respiratory : Clear to auscultation bilaterally, no wheezing or crackles  6. Cardiovascular : S1-S2, regular, no murmur auscultated  7. Gastrointestinal:  Abdomen is soft, nontender, no organomegaly      Data Review:    CBC Recent Labs  Lab 01/21/19 0844 01/22/19 0947 01/27/19 2330  WBC 3.2* 24.1* 1.1*  HGB 9.7* 9.8* 11.3*  HCT 31.0* 30.7* 35.6*  PLT 203 213 212  MCV 82.7 82.3 81.8  MCH 25.9* 26.3 26.0  MCHC 31.3 31.9 31.7  RDW 18.6* 18.9* 17.6*  LYMPHSABS 0.7 1.7  0.5*  MONOABS 1.4* 1.8* 0.1  EOSABS 0.0 0.1 0.0  BASOSABS 0.1 0.1 0.0   ------------------------------------------------------------------------------------------------------------------  Results for orders placed or performed during the hospital encounter of 01/27/19 (from the past 48 hour(s))  Comprehensive metabolic panel      Status: Abnormal   Collection Time: 01/27/19 11:30 PM  Result Value Ref Range   Sodium 133 (L) 135 - 145 mmol/L   Potassium 3.9 3.5 - 5.1 mmol/L   Chloride 97 (L) 98 - 111 mmol/L   CO2 25 22 - 32 mmol/L   Glucose, Bld 146 (H) 70 - 99 mg/dL   BUN 17 6 - 20 mg/dL   Creatinine, Ser 0.87 0.44 - 1.00 mg/dL   Calcium 9.1 8.9 - 10.3 mg/dL   Total Protein 7.1 6.5 - 8.1 g/dL   Albumin 3.1 (L) 3.5 - 5.0 g/dL   AST 45 (H) 15 - 41 U/L   ALT 25 0 - 44 U/L   Alkaline Phosphatase 125 38 - 126 U/L   Total Bilirubin 2.3 (H) 0.3 - 1.2 mg/dL   GFR calc non Af Amer >60 >60 mL/min   GFR calc Af Amer >60 >60 mL/min   Anion gap 11 5 - 15    Comment: Performed at [facility], [address], [place], [place] [address]  Lipase, blood     Status: None   Collection Time: 01/27/19 11:30 PM  Result Value Ref Range   Lipase 18 11 - 51 U/L    Comment: Performed at [facility], [address] [place], [place] [address]  CBC with Differential     Status: Abnormal   Collection Time: 01/27/19 11:30 PM  Result Value Ref Range   WBC 1.1 (LL) 4.0 - 10.5 K/uL    Comment: REPEATED TO VERIFY WHITE COUNT CONFIRMED ON SMEAR THIS CRITICAL RESULT HAS VERIFIED AND BEEN CALLED TO K BELTON,RN BY MARIE KELLY ON 08 20 2020 AT 0039, AND HAS BEEN READ BACK.     RBC 4.35 3.87 - 5.11 MIL/uL   Hemoglobin 11.3 (L) 12.0 - 15.0 g/dL   HCT 35.6 (L) 36.0 - 46.0 %   MCV 81.8 80.0 - 100.0 fL   MCH 26.0 26.0 - 34.0 pg   MCHC 31.7 30.0 - 36.0 g/dL   RDW 17.6 (H) 11.5 - 15.5 %   Platelets 212 150 - 400 K/uL   nRBC 0.0 0.0 - 0.2 %   Neutrophils Relative % 48 %   Neutro Abs 0.5 (L) 1.7 - 7.7 K/uL   Lymphocytes Relative 44 %   Lymphs Abs 0.5 (L) 0.7 - 4.0 K/uL   Monocytes Relative 6 %   Monocytes Absolute 0.1 0.1 - 1.0 K/uL   Eosinophils Relative 0 %   Eosinophils Absolute 0.0 0.0 - 0.5 K/uL   Basophils Relative 1 %   Basophils Absolute 0.0 0.0 - 0.1 K/uL   Immature Granulocytes 1 %   Abs Immature Granulocytes 0.01 0.00 - 0.07  K/uL    Comment: Performed at [facility], [address], [place], [place] [address]  Lactic acid, plasma     Status: Abnormal   Collection Time: 01/27/19 11:30 PM  Result Value Ref Range   Lactic Acid, Venous 3.3 (HH) 0.5 - 1.9 mmol/L    Comment: CRITICAL RESULT CALLED TO, READ BACK BY AND VERIFIED WITH: K BELTON,RN @0033  01/28/19 [facility] Performed at [facility], [address] [place], [place] [address]   Magnesium     Status: Abnormal   Collection Time:  01/27/19 11:30 PM  Result Value Ref Range   Magnesium 1.6 (L) 1.7 - 2.4 mg/dL    Comment: Performed at [facility], [address] [place], [place] [address]  Urinalysis, Routine w reflex microscopic     Status: Abnormal   Collection Time: 01/27/19 11:40 PM  Result Value Ref Range   Color, Urine AMBER (A) YELLOW    Comment: BIOCHEMICALS MAY BE AFFECTED BY COLOR   APPearance HAZY (A) CLEAR   Specific Gravity, Urine 1.020 1.005 - 1.030   pH 5.0 5.0 - 8.0   Glucose, UA NEGATIVE NEGATIVE mg/dL   Hgb urine dipstick NEGATIVE NEGATIVE   Bilirubin Urine NEGATIVE NEGATIVE   Ketones, ur NEGATIVE NEGATIVE mg/dL   Protein, ur 30 (A) NEGATIVE mg/dL   Nitrite NEGATIVE NEGATIVE   Leukocytes,Ua NEGATIVE NEGATIVE   RBC / HPF 0-5 0 - 5 RBC/hpf   WBC, UA 6-10 0 - 5 WBC/hpf   Bacteria, UA NONE SEEN NONE SEEN   Squamous Epithelial / LPF 6-10 0 - 5   Mucus PRESENT    Hyaline Casts, UA PRESENT    Cellular Cast, UA 7    Non Squamous Epithelial 0-5 (A) NONE SEEN    Comment: Performed at [facility], [address], [place], [place] [address]  Lactic acid, plasma     Status: Abnormal   Collection Time: 01/28/19  2:01 AM  Result Value Ref Range   Lactic Acid, Venous 3.2 (HH) 0.5 - 1.9 mmol/L    Comment: CRITICAL RESULT CALLED TO, READ BACK BY AND VERIFIED WITH: K BELTON,RN @0326  01/28/19 MKELLY Performed at [facility], [address] [place], [place] [address]     Chemistries  Recent Labs  Lab 01/21/19 0844 01/27/19 2330  NA 134*  133*  K 3.3* 3.9  CL 100 97*  CO2 28 25  GLUCOSE 147* 146*  BUN 10 17  CREATININE 0.63 0.87  CALCIUM 8.6* 9.1  MG 1.5* 1.6*  AST 30 45*  ALT 18 25  ALKPHOS 114 125  BILITOT 1.6* 2.3*   Liver Function Tests: Recent Labs  Lab 01/21/19 0844 01/27/19 2330  AST 30 45*  ALT 18 25  ALKPHOS 114 125  BILITOT 1.6* 2.3*  PROT 6.3* 7.1  ALBUMIN 2.8* 3.1*   Recent Labs  Lab 01/27/19 2330  LIPASE 18    --------------------------------------------------------------------------------------------------------------- Urine analysis:    Component Value Date/Time   COLORURINE AMBER (A) 01/27/2019 2340   APPEARANCEUR HAZY (A) 01/27/2019 2340   LABSPEC 1.020 01/27/2019 2340   PHURINE 5.0 01/27/2019 2340   GLUCOSEU NEGATIVE 01/27/2019 2340   HGBUR NEGATIVE 01/27/2019 2340   BILIRUBINUR NEGATIVE 01/27/2019 2340   KETONESUR NEGATIVE 01/27/2019 2340   PROTEINUR 30 (A) 01/27/2019 2340   NITRITE NEGATIVE 01/27/2019 2340   LEUKOCYTESUR NEGATIVE 01/27/2019 2340      Imaging Results:      Assessment & Plan:    Active Problems:   Colitis   1. Colitis-patient presented with bloody diarrhea, CT scan of the abdomen pelvis showed descending and sigmoid colitis.  Also patient has neutropenia.  Start IV ciprofloxacin and Flagyl.  GI pathogen panel and C. difficile PCR will be obtained.  Enteric precautions.  IV hydration with normal saline at 125 mL/h.  Zofran PRN for nausea and vomiting.  Fentanyl 25 mcg IV every 4 hours as needed for pain.  2. Neutropenia-secondary to chemotherapy, WBC is 1.1.  Patient received Neupogen 480 mcg subcutaneous x1.  Follow CBC in a.m.  Will also obtain  chest x-ray.  3. Hypomagnesemia-replaced in the ED.  Will recheck magnesium level in a.m.  4. Metastatic breast cancer with mets to liver-patient has chronic ascites, plastic stent placed in hepatic duct in April 2020.  Will hold diuretics at this time due to ongoing diarrhea.  Consider restarting Lasix and  spironolactone once patient diarrhea resolves.    DVT Prophylaxis- SCDs  AM Labs Ordered, also please review Full Orders  Family Communication: Admission, patients condition and plan of care including tests being ordered have been discussed with the patient  who indicate understanding and agree with the plan and Code Status.  Code Status: Full code  Admission status: Inpatient: Based on patients clinical presentation and evaluation of above clinical data, I have made determination that patient meets Inpatient criteria at this time.  Time spent in minutes : 60 minutes   Oswald Hillock M.D on 01/28/2019 at 5:26 AM

## 2019-01-28 NOTE — Progress Notes (Signed)
ASSUMPTION OF CARE NOTE   01/28/2019 11:57 AM  Pamela Long was seen and examined.  The H&P by the admitting provider, orders, imaging was reviewed.  Please see new orders.  Will continue to follow.   Vitals:   01/28/19 1000 01/28/19 1030  BP: 105/62 107/63  Pulse: (!) 111 (!) 118  Resp: 17 17  Temp:    SpO2: 94% 97%    Results for orders placed or performed during the hospital encounter of 01/27/19  Culture, blood (routine x 2)   Specimen: BLOOD  Result Value Ref Range   Specimen Description BLOOD    Special Requests NONE    Culture      NO GROWTH < 12 HOURS Performed at [facility], [address], [place], [place] [address]    Report Status PENDING   Culture, blood (routine x 2)   Specimen: BLOOD  Result Value Ref Range   Specimen Description BLOOD    Special Requests NONE    Culture      NO GROWTH < 12 HOURS Performed at [facility], [address], [place], [place] [address]    Report Status PENDING   SARS Coronavirus 2 [facility] order, Performed in [place] hospital lab) Nasopharyngeal STOOL   Specimen: STOOL; Nasopharyngeal  Result Value Ref Range   SARS Coronavirus 2 NEGATIVE NEGATIVE  C difficile quick scan w PCR reflex   Specimen: STOOL  Result Value Ref Range   C Diff antigen NEGATIVE NEGATIVE   C Diff toxin NEGATIVE NEGATIVE   C Diff interpretation No C. difficile detected.   Comprehensive metabolic panel  Result Value Ref Range   Sodium 133 (L) 135 - 145 mmol/L   Potassium 3.9 3.5 - 5.1 mmol/L   Chloride 97 (L) 98 - 111 mmol/L   CO2 25 22 - 32 mmol/L   Glucose, Bld 146 (H) 70 - 99 mg/dL   BUN 17 6 - 20 mg/dL   Creatinine, Ser 0.87 0.44 - 1.00 mg/dL   Calcium 9.1 8.9 - 10.3 mg/dL   Total Protein 7.1 6.5 - 8.1 g/dL   Albumin 3.1 (L) 3.5 - 5.0 g/dL   AST 45 (H) 15 - 41 U/L   ALT 25 0 - 44 U/L   Alkaline Phosphatase 125 38 - 126 U/L   Total Bilirubin 2.3 (H) 0.3 - 1.2 mg/dL   GFR calc non Af Amer >60 >60 mL/min   GFR calc Af Amer >60 >60  mL/min   Anion gap 11 5 - 15  Lipase, blood  Result Value Ref Range   Lipase 18 11 - 51 U/L  CBC with Differential  Result Value Ref Range   WBC 1.1 (LL) 4.0 - 10.5 K/uL   RBC 4.35 3.87 - 5.11 MIL/uL   Hemoglobin 11.3 (L) 12.0 - 15.0 g/dL   HCT 35.6 (L) 36.0 - 46.0 %   MCV 81.8 80.0 - 100.0 fL   MCH 26.0 26.0 - 34.0 pg   MCHC 31.7 30.0 - 36.0 g/dL   RDW 17.6 (H) 11.5 - 15.5 %   Platelets 212 150 - 400 K/uL   nRBC 0.0 0.0 - 0.2 %   Neutrophils Relative % 48 %   Neutro Abs 0.5 (L) 1.7 - 7.7 K/uL   Lymphocytes Relative 44 %   Lymphs Abs 0.5 (L) 0.7 - 4.0 K/uL   Monocytes Relative 6 %   Monocytes Absolute 0.1 0.1 - 1.0 K/uL   Eosinophils Relative 0 %   Eosinophils Absolute 0.0  0.0 - 0.5 K/uL   Basophils Relative 1 %   Basophils Absolute 0.0 0.0 - 0.1 K/uL   Immature Granulocytes 1 %   Abs Immature Granulocytes 0.01 0.00 - 0.07 K/uL  Lactic acid, plasma  Result Value Ref Range   Lactic Acid, Venous 3.3 (HH) 0.5 - 1.9 mmol/L  Lactic acid, plasma  Result Value Ref Range   Lactic Acid, Venous 3.2 (HH) 0.5 - 1.9 mmol/L  Urinalysis, Routine w reflex microscopic  Result Value Ref Range   Color, Urine AMBER (A) YELLOW   APPearance HAZY (A) CLEAR   Specific Gravity, Urine 1.020 1.005 - 1.030   pH 5.0 5.0 - 8.0   Glucose, UA NEGATIVE NEGATIVE mg/dL   Hgb urine dipstick NEGATIVE NEGATIVE   Bilirubin Urine NEGATIVE NEGATIVE   Ketones, ur NEGATIVE NEGATIVE mg/dL   Protein, ur 30 (A) NEGATIVE mg/dL   Nitrite NEGATIVE NEGATIVE   Leukocytes,Ua NEGATIVE NEGATIVE   RBC / HPF 0-5 0 - 5 RBC/hpf   WBC, UA 6-10 0 - 5 WBC/hpf   Bacteria, UA NONE SEEN NONE SEEN   Squamous Epithelial / LPF 6-10 0 - 5   Mucus PRESENT    Hyaline Casts, UA PRESENT    Cellular Cast, UA 7    Non Squamous Epithelial 0-5 (A) NONE SEEN  Magnesium  Result Value Ref Range   Magnesium 1.6 (L) 1.7 - 2.4 mg/dL  Lactic acid, plasma  Result Value Ref Range   Lactic Acid, Venous 2.1 (HH) 0.5 - 1.9 mmol/L  Sample to  Blood Bank  Result Value Ref Range   Blood Bank Specimen SAMPLE AVAILABLE FOR TESTING    Sample Expiration      01/31/2019,2359 Performed at [facility], [address] [place], [place] [address]    C. Wynetta Emery, MD [facility]   01/27/2019 10:35 PM How to contact the [facility] Attending or Consulting provider [place] or covering provider during after hours [address], for this patient?  1. Check the care team in [facility] and look for a) attending/consulting [facility] provider listed and b) the [facility] team listed 2. Log into [email or web address] and use [place] universal password to access. If you do not have the password, please contact the hospital operator. 3. Locate the [facility] provider you are looking for under [facility] and page to a number that you can be directly reached. 4. If you still have difficulty reaching the provider, please page the [facility] (Director on Call) for the Hospitalists listed on amion for assistance.

## 2019-01-29 ENCOUNTER — Other Ambulatory Visit (HOSPITAL_COMMUNITY): Payer: 59

## 2019-01-29 ENCOUNTER — Ambulatory Visit (HOSPITAL_COMMUNITY): Payer: 59

## 2019-01-29 DIAGNOSIS — A419 Sepsis, unspecified organism: Principal | ICD-10-CM

## 2019-01-29 DIAGNOSIS — K219 Gastro-esophageal reflux disease without esophagitis: Secondary | ICD-10-CM

## 2019-01-29 DIAGNOSIS — E43 Unspecified severe protein-calorie malnutrition: Secondary | ICD-10-CM

## 2019-01-29 LAB — CBC WITH DIFFERENTIAL/PLATELET
Abs Immature Granulocytes: 0.02 10*3/uL (ref 0.00–0.07)
Abs Immature Granulocytes: 0.02 10*3/uL (ref 0.00–0.07)
Basophils Absolute: 0 10*3/uL (ref 0.0–0.1)
Basophils Absolute: 0 10*3/uL (ref 0.0–0.1)
Basophils Relative: 3 %
Basophils Relative: 3 %
Eosinophils Absolute: 0 10*3/uL (ref 0.0–0.5)
Eosinophils Absolute: 0 10*3/uL (ref 0.0–0.5)
Eosinophils Relative: 2 %
Eosinophils Relative: 1 %
HCT: 23 % — ABNORMAL LOW (ref 36.0–46.0)
HCT: 22.6 % — ABNORMAL LOW (ref 36.0–46.0)
Hemoglobin: 7.3 g/dL — ABNORMAL LOW (ref 12.0–15.0)
Hemoglobin: 7.2 g/dL — ABNORMAL LOW (ref 12.0–15.0)
Immature Granulocytes: 3 %
Immature Granulocytes: 3 %
Lymphocytes Relative: 52 %
Lymphocytes Relative: 56 %
Lymphs Abs: 0.3 10*3/uL — ABNORMAL LOW (ref 0.7–4.0)
Lymphs Abs: 0.4 10*3/uL — ABNORMAL LOW (ref 0.7–4.0)
MCH: 25.6 pg — ABNORMAL LOW (ref 26.0–34.0)
MCH: 25.9 pg — ABNORMAL LOW (ref 26.0–34.0)
MCHC: 31.7 g/dL (ref 30.0–36.0)
MCHC: 31.9 g/dL (ref 30.0–36.0)
MCV: 80.7 fL (ref 80.0–100.0)
MCV: 81.3 fL (ref 80.0–100.0)
Monocytes Absolute: 0.1 10*3/uL (ref 0.1–1.0)
Monocytes Absolute: 0.2 10*3/uL (ref 0.1–1.0)
Monocytes Relative: 18 %
Monocytes Relative: 20 %
Neutro Abs: 0.1 10*3/uL — ABNORMAL LOW (ref 1.7–7.7)
Neutro Abs: 0.1 10*3/uL — ABNORMAL LOW (ref 1.7–7.7)
Neutrophils Relative %: 22 %
Neutrophils Relative %: 17 %
Platelets: 87 10*3/uL — ABNORMAL LOW (ref 150–400)
Platelets: 89 10*3/uL — ABNORMAL LOW (ref 150–400)
RBC: 2.85 MIL/uL — ABNORMAL LOW (ref 3.87–5.11)
RBC: 2.78 MIL/uL — ABNORMAL LOW (ref 3.87–5.11)
RDW: 17.8 % — ABNORMAL HIGH (ref 11.5–15.5)
RDW: 17.8 % — ABNORMAL HIGH (ref 11.5–15.5)
WBC: 0.6 10*3/uL — CL (ref 4.0–10.5)
WBC: 0.8 10*3/uL — CL (ref 4.0–10.5)
nRBC: 0 % (ref 0.0–0.2)
nRBC: 0 % (ref 0.0–0.2)

## 2019-01-29 LAB — COMPREHENSIVE METABOLIC PANEL
ALT: 19 U/L (ref 0–44)
AST: 25 U/L (ref 15–41)
Albumin: 2.2 g/dL — ABNORMAL LOW (ref 3.5–5.0)
Alkaline Phosphatase: 78 U/L (ref 38–126)
Anion gap: 6 (ref 5–15)
BUN: 9 mg/dL (ref 6–20)
CO2: 24 mmol/L (ref 22–32)
Calcium: 7.7 mg/dL — ABNORMAL LOW (ref 8.9–10.3)
Chloride: 103 mmol/L (ref 98–111)
Creatinine, Ser: 0.51 mg/dL (ref 0.44–1.00)
GFR calc Af Amer: >60 mL/min (ref >60)
GFR calc non Af Amer: >60 mL/min (ref >60)
Glucose, Bld: 87 mg/dL (ref 70–99)
Potassium: 2.9 mmol/L — ABNORMAL LOW (ref 3.5–5.1)
Sodium: 133 mmol/L — ABNORMAL LOW (ref 135–145)
Total Bilirubin: 1.3 mg/dL — ABNORMAL HIGH (ref 0.3–1.2)
Total Protein: 4.7 g/dL — ABNORMAL LOW (ref 6.5–8.1)

## 2019-01-29 LAB — GASTROINTESTINAL PANEL BY PCR, STOOL (REPLACES STOOL CULTURE)

## 2019-01-29 LAB — MAGNESIUM: Magnesium: 1.5 mg/dL — ABNORMAL LOW (ref 1.7–2.4)

## 2019-01-29 MED ORDER — TBO-FILGRASTIM 480 MCG/0.8ML ~~LOC~~ SOSY
480.0000 ug | PREFILLED_SYRINGE | Freq: Every day | SUBCUTANEOUS | Status: DC
  Administered 2019-01-30: 480 ug via SUBCUTANEOUS
  Filled 2019-01-29 (×2): qty 0.8

## 2019-01-29 MED ORDER — POTASSIUM CHLORIDE 10 MEQ/100ML IV SOLN
10.0000 meq | INTRAVENOUS | Status: AC
  Administered 2019-01-29 (×3): 10 meq via INTRAVENOUS
  Filled 2019-01-29 (×3): qty 100

## 2019-01-29 MED ORDER — MAGNESIUM SULFATE 4 GM/100ML IV SOLN
4.0000 g | Freq: Once | INTRAVENOUS | Status: AC
  Administered 2019-01-29: 4 g via INTRAVENOUS
  Filled 2019-01-29: qty 100

## 2019-01-29 MED ORDER — TBO-FILGRASTIM 480 MCG/0.8ML ~~LOC~~ SOSY
480.0000 ug | PREFILLED_SYRINGE | Freq: Once | SUBCUTANEOUS | Status: AC
  Administered 2019-01-29: 480 ug via SUBCUTANEOUS
  Filled 2019-01-29: qty 0.8

## 2019-01-29 MED ORDER — POTASSIUM CHLORIDE IN NACL 20-0.9 MEQ/L-% IV SOLN
INTRAVENOUS | Status: DC
  Administered 2019-01-29 – 2019-01-31 (×2): via INTRAVENOUS

## 2019-01-29 NOTE — Evaluation (Signed)
Physical Therapy Evaluation Patient Details Name: Pamela Long MRN: [id number] DOB: 08/31/1970 Today's Date: 01/29/2019   History of Present Illness  Pamela Long  is a 48 y.o. female, with history of metastatic breast cancer dating back to 2017 when she received neoadjuvant chemotherapy followed by mastectomy and antiestrogen therapy.  Therapy was discontinued because of side effects.  Patient again presented to the ED in December 2019 with nausea vomiting and found to have large liver mass.  The mass was biopsied at [facility] in June 26, 2018 and turned out to be metastatic breast cancer.  She was started on chemotherapy by oncology.  She had an MRCP on 09/18/2018 which revealed very large liver lesion along with small lesion consistent with metastatic disease.  Study also showed dilated biliary system with cutoff in the region of proximal CBD consistent with stricture most likely due to adenopathy.  She had plastic stent placed in the hepatic duct with good drainage of bile on 09/22/2018. Today patient came to the hospital with nausea vomiting and diarrhea.  Patient says that she had 7-8 loose BM at home.  She also noticed blood in the stool.    Clinical Impression  Patient independent in all bed mobilities and transfers. Able to ambulate 150 feet without AD with supervision. Patient a little wobbly with ambulation but per patient this is her baseline when walking distances greater than typical home ambulation. Educated patient on walking daily with nursing staff, updated nursing. No skilled physical therapy needs at this time.    Follow Up Recommendations No PT follow up    Equipment Recommendations  None recommended by PT    Recommendations for Other Services       Precautions / Restrictions        Mobility  Bed Mobility Overal bed mobility: Independent                Transfers Overall transfer level: Modified independent Equipment used: None                 Ambulation/Gait Ambulation/Gait assistance: Supervision Gait Distance (Feet): 150 Feet Assistive device: None Gait Pattern/deviations: Decreased stride length Gait velocity: decreased      Stairs            Wheelchair Mobility    Modified Rankin (Stroke Patients Only)       Balance Overall balance assessment: Modified Independent                                           Pertinent Vitals/Pain      Home Living Family/patient expects to be discharged to:: Private residence Living Arrangements: Children Available Help at Discharge: Family;Available PRN/intermittently Type of Home: Mobile home Home Access: Stairs to enter Entrance Stairs-Rails: Right Entrance Stairs-Number of Steps: 4 Home Layout: One level Home Equipment: Walker - 2 wheels;Shower seat;Cane - single point      Prior Function Level of Independence: Needs assistance   Gait / Transfers Assistance Needed: ambulates with cane in community and no AD at home  ADL's / Homemaking Assistance Needed: assisted by daughter        Hand Dominance        Extremity/Trunk Assessment        Lower Extremity Assessment Lower Extremity Assessment: Overall WFL for tasks assessed    Cervical / Trunk Assessment Cervical / Trunk Assessment: Normal  Communication  Communication: No difficulties  Cognition Arousal/Alertness: Awake/alert Behavior During Therapy: WFL for tasks assessed/performed Overall Cognitive Status: Within Functional Limits for tasks assessed                                        General Comments      Exercises     Assessment/Plan    PT Assessment Patent does not need any further PT services  PT Problem List         PT Treatment Interventions      PT Goals (Current goals can be found in the Care Plan section)  Acute Rehab PT Goals Patient Stated Goal: to go home PT Goal Formulation: With patient Time For Goal Achievement:  02/11/19 Potential to Achieve Goals: Good    Frequency     Barriers to discharge        Co-evaluation               AM-PAC PT "6 Clicks" Mobility  Outcome Measure Help needed turning from your back to your side while in a flat bed without using bedrails?: None Help needed moving from lying on your back to sitting on the side of a flat bed without using bedrails?: None Help needed moving to and from a bed to a chair (including a wheelchair)?: None Help needed standing up from a chair using your arms (e.g., wheelchair or bedside chair)?: None Help needed to walk in hospital room?: None Help needed climbing 3-5 steps with a railing? : A Little 6 Click Score: 23    End of Session   Activity Tolerance: Patient tolerated treatment well;Patient limited by fatigue Patient left: in chair;with call bell/phone within reach Nurse Communication: Mobility status PT Visit Diagnosis: Unsteadiness on feet (R26.81);Other abnormalities of gait and mobility (R26.89)    Time: [id number] PT Time Calculation (min) (ACUTE ONLY): 26 min   Charges:   PT Evaluation $PT Eval Moderate Complexity: 1 Mod PT Treatments $Therapeutic Activity: 8-22 mins      3:13 PM, 01/29/19 Jerene Pitch, DPT Physical Therapy with [facility]  [phone] office

## 2019-01-29 NOTE — Evaluation (Signed)
Physical Therapy Evaluation Patient Details Name: Pamela Long MRN: [id number] DOB: May 06, 1971 Today's Date: 01/29/2019   History of Present Illness  Pamela Long  is a 48 y.o. female, with history of metastatic breast cancer dating back to 2017 when she received neoadjuvant chemotherapy followed by mastectomy and antiestrogen therapy.  Therapy was discontinued because of side effects.  Patient again presented to the ED in December 2019 with nausea vomiting and found to have large liver mass.  The mass was biopsied at [facility] in June 26, 2018 and turned out to be metastatic breast cancer.  She was started on chemotherapy by oncology.  She had an MRCP on 09/18/2018 which revealed very large liver lesion along with small lesion consistent with metastatic disease.  Study also showed dilated biliary system with cutoff in the region of proximal CBD consistent with stricture most likely due to adenopathy.  She had plastic stent placed in the hepatic duct with good drainage of bile on 09/22/2018. Today patient came to the hospital with nausea vomiting and diarrhea.  Patient says that she had 7-8 loose BM at home.  She also noticed blood in the stool.    Clinical Impression  Patient able to perform all bed mobilities and transfers with modified independence and able to ambulate 150 feet with supervision and no assistive device. Patient reports this is her current baseline for function. No skilled physical therapy needs noted at this time. Patient discharged from physical therapy to nursing staff for ambulation as tolerated daily for length of stay.    Follow Up Recommendations No PT follow up    Equipment Recommendations  None recommended by PT    Recommendations for Other Services       Precautions / Restrictions        Mobility  Bed Mobility Overal bed mobility: Independent                Transfers Overall transfer level: Modified independent Equipment used: None                Ambulation/Gait Ambulation/Gait assistance: Supervision Gait Distance (Feet): 150 Feet Assistive device: None Gait Pattern/deviations: Decreased stride length Gait velocity: decreased      Stairs            Wheelchair Mobility    Modified Rankin (Stroke Patients Only)       Balance Overall balance assessment: Modified Independent                                           Pertinent Vitals/Pain      Home Living Family/patient expects to be discharged to:: Private residence Living Arrangements: Children Available Help at Discharge: Family;Available PRN/intermittently Type of Home: Mobile home Home Access: Stairs to enter Entrance Stairs-Rails: Right Entrance Stairs-Number of Steps: 4 Home Layout: One level Home Equipment: Walker - 2 wheels;Shower seat;Cane - single point      Prior Function Level of Independence: Needs assistance   Gait / Transfers Assistance Needed: ambulates with cane in community and no AD at home  ADL's / Homemaking Assistance Needed: assisted by daughter        Hand Dominance        Extremity/Trunk Assessment        Lower Extremity Assessment Lower Extremity Assessment: Overall WFL for tasks assessed    Cervical / Trunk Assessment Cervical / Trunk Assessment: Normal  Communication   Communication: No difficulties  Cognition Arousal/Alertness: Awake/alert Behavior During Therapy: WFL for tasks assessed/performed Overall Cognitive Status: Within Functional Limits for tasks assessed                                        General Comments      Exercises     Assessment/Plan    PT Assessment Patent does not need any further PT services  PT Problem List         PT Treatment Interventions      PT Goals (Current goals can be found in the Care Plan section)  Acute Rehab PT Goals Patient Stated Goal: to go home PT Goal Formulation: With patient Time For Goal  Achievement: 02/11/19 Potential to Achieve Goals: Good    Frequency     Barriers to discharge        Co-evaluation               AM-PAC PT "6 Clicks" Mobility  Outcome Measure Help needed turning from your back to your side while in a flat bed without using bedrails?: None Help needed moving from lying on your back to sitting on the side of a flat bed without using bedrails?: None Help needed moving to and from a bed to a chair (including a wheelchair)?: None Help needed standing up from a chair using your arms (e.g., wheelchair or bedside chair)?: None Help needed to walk in hospital room?: None Help needed climbing 3-5 steps with a railing? : A Little 6 Click Score: 23    End of Session   Activity Tolerance: Patient tolerated treatment well;Patient limited by fatigue Patient left: in chair;with call bell/phone within reach Nurse Communication: Mobility status PT Visit Diagnosis: Unsteadiness on feet (R26.81);Other abnormalities of gait and mobility (R26.89)    Time: [id number] PT Time Calculation (min) (ACUTE ONLY): 26 min   Charges:   PT Evaluation $PT Eval Moderate Complexity: 1 Mod PT Treatments $Therapeutic Activity: 8-22 mins       3:39 PM, 01/29/19 Jerene Pitch, DPT Physical Therapy with [facility]  [phone] office

## 2019-01-29 NOTE — Progress Notes (Addendum)
PROGRESS NOTE    Pamela Long  [id number]  DOB: 12-13-1970  DOA: 01/27/2019 PCP: Doree Albee, MD   Brief Admission Hx: 48 year old female currently being treated for metastatic breast cancer at her last cycle of chemotherapy with paclitaxel on 01/21/2019.  She presented to the ED with complaints of fever diarrhea and left lower abdominal pain and was diagnosed with sepsis and colitis in addition to neutropenia.  MDM/Assessment & Plan:   Sigmoid colitis-patient seems to be having some clinical improvement with IV fluids and IV antibiotics.  She is tolerating diet at this time however still having abdominal pain with eating small amounts.  Continue current management. Sepsis secondary to colitis -treated with IV fluid hydration IV antibiotics and lactic acid has been trending down. Hypokalemia/Hypomagnesemia - Replacement ordered. Follow.  Hyperbilirubinemia-patient is status post having a stent in the hepatic duct placed by Dr. Laural Golden.  Her bilirubin is trended down to 1.3 today which is reassuring. Pancytopenia / Neutropenia secondary to chemotherapy-discussed with Dr. [name] and patient is to receive Neupogen 480 mg subcu daily if the [place] is less than 1500. Malignant ascites-patient is maintained on spironolactone and Lasix as an outpatient.  We are temporarily holding diuretics as she is dehydrated but plan to resume as an outpatient. Sinus tachycardia-secondary to malignancy, dehydration and acute infection.  DVT prophylaxis: SCDs Code Status: Full Family Communication: Patient at bedside Disposition Plan: Continue inpatient treatments for IV fluids, IV antibiotics and other medication   Consultants: Oncology  Procedures:   Antimicrobials: Ciprofloxacin/metronidazole 01/28/19 >>  Subjective: Patient says she is feeling baseline but having less abdominal pain.  She is having a difficult time eating because she has pain after eating small amounts of  food.  Objective: Vitals:   01/29/19 0400 01/29/19 0500 01/29/19 0600 01/29/19 0832  BP: 95/64 111/72 (!) 94/58   Pulse: (!) 115 (!) 109 (!) 114   Resp: 19 19 (!) 21   Temp: 99.9 F (37.7 C)   100.2 F (37.9 C)  TempSrc: Oral   Oral  SpO2: 91% (!) 89% 93%   Weight:  76.8 kg    Height:        Intake/Output Summary (Last 24 hours) at 01/29/2019 1123 Last data filed at 01/29/2019 [id number] Gross per 24 hour  Intake 2891.22 ml  Output --  Net 2891.22 ml   Filed Weights   01/27/19 2222 01/28/19 1232 01/29/19 0500  Weight: 76.6 kg 76.3 kg 76.8 kg     REVIEW OF SYSTEMS  As per history otherwise all reviewed and reported negative  Exam:  General exam: Chronically ill-appearing female lying in the bed she is awake and alert in no apparent distress.  Dry mucous membranes noted. Respiratory system: Clear. No increased work of breathing. Cardiovascular system: S1 & S2 heard.  Tachycardic rate.  No JVD, murmurs, gallops, clicks or pedal edema. Gastrointestinal system: Abdomen is nondistended, soft and nontender. Normal bowel sounds heard. Central nervous system: Alert and oriented. No focal neurological deficits. Extremities: no CCE.  SCDs.  Data Reviewed: Basic Metabolic Panel: Recent Labs  Lab 01/27/19 2330 01/29/19 0449  NA 133* 133*  K 3.9 2.9*  CL 97* 103  CO2 25 24  GLUCOSE 146* 87  BUN 17 9  CREATININE 0.87 0.51  CALCIUM 9.1 7.7*  MG 1.6* 1.5*   Liver Function Tests: Recent Labs  Lab 01/27/19 2330 01/29/19 0449  AST 45* 25  ALT 25 19  ALKPHOS 125 78  BILITOT 2.3* 1.3*  PROT 7.1  4.7*  ALBUMIN 3.1* 2.2*   Recent Labs  Lab 01/27/19 2330  LIPASE 18   No results for input(s): AMMONIA in the last 168 hours. CBC: Recent Labs  Lab 01/27/19 2330 01/29/19 0449 01/29/19 0628  WBC 1.1* 0.6* 0.8*  NEUTROABS 0.5* 0.1* 0.1*  HGB 11.3* 7.3* 7.2*  HCT 35.6* 23.0* 22.6*  MCV 81.8 80.7 81.3  PLT 212 87* 89*   Cardiac Enzymes: No results for input(s):  CKTOTAL, CKMB, CKMBINDEX, TROPONINI in the last 168 hours. CBG (last 3)  No results for input(s): GLUCAP in the last 72 hours. Recent Results (from the past 240 hour(s))  Culture, blood (routine x 2)     Status: None (Preliminary result)   Collection Time: 01/27/19 11:57 PM   Specimen: BLOOD RIGHT HAND  Result Value Ref Range Status   Specimen Description BLOOD RIGHT HAND  Final   Special Requests   Final    Blood Culture adequate volume BOTTLES DRAWN AEROBIC AND ANAEROBIC   Culture   Final    NO GROWTH 1 DAY Performed at [facility], [address], [place], [place] [address]    Report Status PENDING  Incomplete  Culture, blood (routine x 2)     Status: None (Preliminary result)   Collection Time: 01/28/19 12:11 AM   Specimen: BLOOD RIGHT WRIST  Result Value Ref Range Status   Specimen Description BLOOD RIGHT WRIST  Final   Special Requests   Final    BOTTLES DRAWN AEROBIC AND ANAEROBIC Blood Culture adequate volume   Culture   Final    NO GROWTH 1 DAY Performed at [facility] [address], [place], [place] [address]    Report Status PENDING  Incomplete  SARS Coronavirus 2 [facility] order, Performed in [place] hospital lab) Nasopharyngeal STOOL     Status: None   Collection Time: 01/28/19  5:16 AM   Specimen: STOOL; Nasopharyngeal  Result Value Ref Range Status   SARS Coronavirus 2 NEGATIVE NEGATIVE Final    Comment: (NOTE) If result is NEGATIVE SARS-CoV-2 target nucleic acids are NOT DETECTED. The SARS-CoV-2 RNA is generally detectable in upper and lower  respiratory specimens during the acute phase of infection. The lowest  concentration of SARS-CoV-2 viral copies this assay can detect is 250  copies / mL. A negative result does not preclude SARS-CoV-2 infection  and should not be used as the sole basis for treatment or other  patient management decisions.  A negative result may occur with  improper specimen collection / handling, submission of specimen other   than nasopharyngeal swab, presence of viral mutation(s) within the  areas targeted by this assay, and inadequate number of viral copies  (<250 copies / mL). A negative result must be combined with clinical  observations, patient history, and epidemiological information. If result is POSITIVE SARS-CoV-2 target nucleic acids are DETECTED. The SARS-CoV-2 RNA is generally detectable in upper and lower  respiratory specimens dur ing the acute phase of infection.  Positive  results are indicative of active infection with SARS-CoV-2.  Clinical  correlation with patient history and other diagnostic information is  necessary to determine patient infection status.  Positive results do  not rule out bacterial infection or co-infection with other viruses. If result is PRESUMPTIVE POSTIVE SARS-CoV-2 nucleic acids MAY BE PRESENT.   A presumptive positive result was obtained on the submitted specimen  and confirmed on repeat testing.  While 2019 novel coronavirus  (SARS-CoV-2) nucleic acids may be present in the submitted sample  additional confirmatory testing may be necessary for epidemiological  and / or clinical management purposes  to differentiate between  SARS-CoV-2 and other Sarbecovirus currently known to infect humans.  If clinically indicated additional testing with an alternate test  methodology [id number]) is advised. The SARS-CoV-2 RNA is generally  detectable in upper and lower respiratory sp ecimens during the acute  phase of infection. The expected result is Negative. Fact Sheet for Patients:  StrictlyIdeas.no Fact Sheet for Healthcare Providers: BankingDealers.co.za This test is not yet approved or cleared by the [place] FDA and has been authorized for detection and/or diagnosis of SARS-CoV-2 by FDA under an Emergency Use Authorization (EUA).  This EUA will remain in effect (meaning this test can be used) for the duration of  the COVID-19 declaration under Section 564(b)(1) of the Act, 21 U.S.C. section 360bbb-3(b)(1), unless the authorization is terminated or revoked sooner. Performed at [facility], [address], [place], [place] [address]   C difficile quick scan w PCR reflex     Status: None   Collection Time: 01/28/19  5:23 AM   Specimen: STOOL  Result Value Ref Range Status   C Diff antigen NEGATIVE NEGATIVE Final   C Diff toxin NEGATIVE NEGATIVE Final   C Diff interpretation No C. difficile detected.  Final    Comment: Performed at [facility], [address] [place], [place] [address]  MRSA PCR Screening     Status: None   Collection Time: 01/28/19 12:39 PM   Specimen: Nasal Mucosa; Nasopharyngeal  Result Value Ref Range Status   MRSA by PCR NEGATIVE NEGATIVE Final    Comment:        The GeneXpert MRSA Assay (FDA approved for NASAL specimens only), is one component of a comprehensive MRSA colonization surveillance program. It is not intended to diagnose MRSA infection nor to guide or monitor treatment for MRSA infections. Performed at [facility], [address], [place],  [address]      Studies: Ct Abdomen Pelvis W Contrast  Result Date: 01/28/2019 CLINICAL DATA:  History of abdominal pain and metastatic disease to the liver EXAM: CT ABDOMEN AND PELVIS WITH CONTRAST TECHNIQUE: Multidetector CT imaging of the abdomen and pelvis was performed using the standard protocol following bolus administration of intravenous contrast. CONTRAST:  126mL OMNIPAQUE IOHEXOL 300 MG/ML  SOLN COMPARISON:  12/29/2018 FINDINGS: Lower chest: Small left pleural effusion is noted slightly decreased when compare with the prior exam. Hepatobiliary: Stable metastatic lesion is noted within the liver. The gallbladder is decompressed. Plastic biliary stent is noted in place stable from the prior exam. No new focal hepatic lesion is noted. Pancreas: Within normal limits. Spleen: Normal in size without focal  abnormality. Adrenals/Urinary Tract: Adrenal glands are unremarkable. Kidneys demonstrate a normal enhancement pattern bilaterally. Normal excretion of contrast is noted on delayed images. No renal calculi or obstructive changes are seen. The bladder is decompressed. Stomach/Bowel: The sigmoid and descending colon demonstrates significant wall thickening consistent with colitis. This begins at the level of the splenic flexure and extends through the sigmoid. No perforation is noted. Significant pericolonic inflammatory changes are noted. The more proximal colon appears within normal limits. The appendix is unremarkable. No small bowel abnormality is seen. The stomach is within normal limits. Vascular/Lymphatic: No significant vascular findings are present. No enlarged abdominal or pelvic lymph nodes. Reproductive: Status post hysterectomy. No adnexal masses. Other: Mild ascites is noted no definitive peritoneal disease is noted. Musculoskeletal: No acute or significant osseous findings. IMPRESSION: Changes consistent  with colitis in the descending and sigmoid colons. No perforation is noted. Left-sided pleural effusion slightly decreased in the interval from the prior exam. Stable hepatic metastatic disease.  Biliary stent is noted in place. Electronically Signed   By: Inez Catalina M.D.   On: 01/28/2019 10:38   Dg Chest Portable 1 View  Result Date: 01/28/2019 CLINICAL DATA:  Nausea and vomiting EXAM: PORTABLE CHEST 1 VIEW COMPARISON:  12/23/2018 FINDINGS: Cardiac shadow is within normal limits. The lungs are well aerated bilaterally with the exception of a small left-sided pleural effusion. This is stable from the prior exam. Left chest wall port is again seen and stable. No bony abnormality is seen. IMPRESSION: Stable left pleural effusion.  No acute abnormality noted. Electronically Signed   By: Inez Catalina M.D.   On: 01/28/2019 10:07   Dg Abd Portable 1 View  Result Date: 01/28/2019 CLINICAL DATA:   Abdominal pain. The patient is currently on chemotherapy. EXAM: PORTABLE ABDOMEN - 1 VIEW COMPARISON:  CT dated December 29, 2003 FINDINGS: The bowel gas pattern is nonspecific and nonobstructive. There is a biliary stent in place. No definite pneumatosis or free air. IMPRESSION: Nonobstructive bowel gas pattern. Electronically Signed   By: Constance Holster M.D.   On: 01/28/2019 15:36     Scheduled Meds:  escitalopram  20 mg Oral Daily   gabapentin  300 mg Oral QHS   pantoprazole  40 mg Oral Daily   scopolamine  1 patch Transdermal Q72H   Tbo-Filgrastim  480 mcg Subcutaneous Once   Continuous Infusions:  sodium chloride 100 mL/hr at 01/29/19 [id number]   ciprofloxacin Stopped (01/29/19 0310)   metronidazole 500 mg (01/29/19 1032)    Active Problems:   GERD (gastroesophageal reflux disease)   Severe protein-calorie malnutrition (HCC)   Sepsis (HCC)   Colitis   Chemotherapy-induced neutropenia (HCC)   Dehydration   Lactic acid acidosis   Nausea vomiting and diarrhea  Critical Care Time spent: 34 mins  Irwin Brakeman, MD [facility] 01/29/2019, 11:23 AM    LOS: 1 day  How to contact the [facility] Attending or Consulting provider [place] or covering provider during after hours [place], for this patient?  Check the care team in [facility] and look for a) attending/consulting [facility] provider listed and b) the [facility] team listed Log into [email or web address] and use [place]'s universal password to access. If you do not have the password, please contact the hospital operator. Locate the [facility] provider you are looking for under [facility] and page to a number that you can be directly reached. If you still have difficulty reaching the provider, please page the [facility] (Director on Call) for the Hospitalists listed on amion for assistance.

## 2019-01-30 LAB — CBC WITH DIFFERENTIAL/PLATELET
Abs Immature Granulocytes: 0.04 10*3/uL (ref 0.00–0.07)
Basophils Absolute: 0 10*3/uL (ref 0.0–0.1)
Basophils Relative: 3 %
Eosinophils Absolute: 0 10*3/uL (ref 0.0–0.5)
Eosinophils Relative: 1 %
HCT: 23.7 % — ABNORMAL LOW (ref 36.0–46.0)
Hemoglobin: 7.5 g/dL — ABNORMAL LOW (ref 12.0–15.0)
Immature Granulocytes: 3 %
Lymphocytes Relative: 43 %
Lymphs Abs: 0.5 10*3/uL — ABNORMAL LOW (ref 0.7–4.0)
MCH: 26 pg (ref 26.0–34.0)
MCHC: 31.6 g/dL (ref 30.0–36.0)
MCV: 82 fL (ref 80.0–100.0)
Monocytes Absolute: 0.3 10*3/uL (ref 0.1–1.0)
Monocytes Relative: 28 %
Neutro Abs: 0.3 10*3/uL — ABNORMAL LOW (ref 1.7–7.7)
Neutrophils Relative %: 22 %
Platelets: 105 10*3/uL — ABNORMAL LOW (ref 150–400)
RBC: 2.89 MIL/uL — ABNORMAL LOW (ref 3.87–5.11)
RDW: 18 % — ABNORMAL HIGH (ref 11.5–15.5)
WBC: 1.2 10*3/uL — CL (ref 4.0–10.5)
nRBC: 5.2 % — ABNORMAL HIGH (ref 0.0–0.2)

## 2019-01-30 LAB — COMPREHENSIVE METABOLIC PANEL
ALT: 17 U/L (ref 0–44)
AST: 24 U/L (ref 15–41)
Albumin: 2.1 g/dL — ABNORMAL LOW (ref 3.5–5.0)
Alkaline Phosphatase: 85 U/L (ref 38–126)
Anion gap: 6 (ref 5–15)
BUN: 7 mg/dL (ref 6–20)
CO2: 23 mmol/L (ref 22–32)
Calcium: 7.6 mg/dL — ABNORMAL LOW (ref 8.9–10.3)
Chloride: 104 mmol/L (ref 98–111)
Creatinine, Ser: 0.55 mg/dL (ref 0.44–1.00)
GFR calc Af Amer: >60 mL/min (ref >60)
GFR calc non Af Amer: >60 mL/min (ref >60)
Glucose, Bld: 78 mg/dL (ref 70–99)
Potassium: 3.1 mmol/L — ABNORMAL LOW (ref 3.5–5.1)
Sodium: 133 mmol/L — ABNORMAL LOW (ref 135–145)
Total Bilirubin: 1.1 mg/dL (ref 0.3–1.2)
Total Protein: 4.7 g/dL — ABNORMAL LOW (ref 6.5–8.1)

## 2019-01-30 LAB — MAGNESIUM: Magnesium: 1.7 mg/dL (ref 1.7–2.4)

## 2019-01-30 MED ORDER — POTASSIUM CHLORIDE CRYS ER 20 MEQ PO TBCR
60.0000 meq | EXTENDED_RELEASE_TABLET | Freq: Once | ORAL | Status: AC
  Administered 2019-01-30: 60 meq via ORAL
  Filled 2019-01-30: qty 3

## 2019-01-30 MED ORDER — MAGNESIUM SULFATE 2 GM/50ML IV SOLN
2.0000 g | Freq: Once | INTRAVENOUS | Status: AC
  Administered 2019-01-30: 2 g via INTRAVENOUS
  Filled 2019-01-30: qty 50

## 2019-01-30 NOTE — Progress Notes (Signed)
PROGRESS NOTE    Pamela Long  [id number]  DOB: 1971-02-11  DOA: 01/27/2019 PCP: Doree Albee, MD   Brief Admission Hx: 48 year old female currently being treated for metastatic breast cancer at her last cycle of chemotherapy with paclitaxel on 01/21/2019.  She presented to the ED with complaints of fever diarrhea and left lower abdominal pain and was diagnosed with sepsis and colitis in addition to neutropenia.   MDM/Assessment & Plan:   1. Sigmoid colitis-patient continues to improve with IV fluids and IV antibiotics.  She is tolerating diet better with less abdominal pain.  Continue current management. 2. Sepsis secondary to colitis -treated with IV fluid hydration IV antibiotics and lactic acid has trended down. 3. Hypokalemia/Hypomagnesemia - additional replacement ordered. Follow.  4. Hyperbilirubinemia-patient is status post having a stent in the hepatic duct placed by Dr. Laural Golden.  Her bilirubin is trended down to 1.1 today which is reassuring. 5. Pancytopenia / Neutropenia secondary to chemotherapy-discussed with Dr. [name] and patient is to receive Neupogen 480 mg subcu daily if the [place] is less than 1500.  WBC is 1.2 today.  Follow.  6. Malignant ascites-patient is maintained on spironolactone and Lasix as an outpatient.  We are temporarily holding diuretics as she is dehydrated but plan to resume as an outpatient. 7. Sinus tachycardia-secondary to malignancy, dehydration and acute infection. Improving with treatments.   DVT prophylaxis: SCDs Code Status: Full Family Communication: Patient at bedside Disposition Plan: Continue inpatient treatments for IV fluids, IV antibiotics and other medication  Consultants:  Oncology  Procedures:    Antimicrobials:  Ciprofloxacin/metronidazole 01/28/19 >>  Subjective: Patient says she is feeling less abdominal pain with eating.    Objective: Vitals:   01/30/19 0700 01/30/19 0800 01/30/19 0900 01/30/19 1126   BP: 104/63 (!) 97/58 102/63   Pulse: 100 (!) 102 (!) 101   Resp: 16 20 20    Temp:    98.8 F (37.1 C)  TempSrc:    Oral  SpO2: 90% 91% 90%   Weight:      Height:        Intake/Output Summary (Last 24 hours) at 01/30/2019 1343 Last data filed at 01/30/2019 0900 Gross per 24 hour  Intake 2074.3 ml  Output -  Net 2074.3 ml   Filed Weights   01/27/19 2222 01/28/19 1232 01/29/19 0500  Weight: 76.6 kg 76.3 kg 76.8 kg   REVIEW OF SYSTEMS  As per history otherwise all reviewed and reported negative  Exam:  General exam: Chronically ill-appearing female lying in the bed she is awake and alert in no apparent distress. Respiratory system: Clear. No increased work of breathing. Cardiovascular system: S1 & S2 heard.  Tachycardic rate.  No JVD, murmurs, gallops, clicks or pedal edema. Gastrointestinal system: Abdomen is nondistended, soft and mild LLQ tenderness. Normal bowel sounds heard. Central nervous system: Alert and oriented. No focal neurological deficits. Extremities: no CCE.  SCDs.  Data Reviewed: Basic Metabolic Panel: Recent Labs  Lab 01/27/19 2330 01/29/19 0449 01/30/19 0515  NA 133* 133* 133*  K 3.9 2.9* 3.1*  CL 97* 103 104  CO2 25 24 23   GLUCOSE 146* 87 78  BUN 17 9 7   CREATININE 0.87 0.51 0.55  CALCIUM 9.1 7.7* 7.6*  MG 1.6* 1.5* 1.7   Liver Function Tests: Recent Labs  Lab 01/27/19 2330 01/29/19 0449 01/30/19 0515  AST 45* 25 24  ALT 25 19 17   ALKPHOS 125 78 85  BILITOT 2.3* 1.3* 1.1  PROT 7.1 4.7*  4.7*  ALBUMIN 3.1* 2.2* 2.1*   Recent Labs  Lab 01/27/19 2330  LIPASE 18   No results for input(s): AMMONIA in the last 168 hours. CBC: Recent Labs  Lab 01/27/19 2330 01/29/19 0449 01/29/19 0628 01/30/19 0515  WBC 1.1* 0.6* 0.8* 1.2*  NEUTROABS 0.5* 0.1* 0.1* 0.3*  HGB 11.3* 7.3* 7.2* 7.5*  HCT 35.6* 23.0* 22.6* 23.7*  MCV 81.8 80.7 81.3 82.0  PLT 212 87* 89* 105*   Cardiac Enzymes: No results for input(s): CKTOTAL, CKMB, CKMBINDEX,  TROPONINI in the last 168 hours. CBG (last 3)  No results for input(s): GLUCAP in the last 72 hours. Recent Results (from the past 240 hour(s))  Culture, blood (routine x 2)     Status: None (Preliminary result)   Collection Time: 01/27/19 11:57 PM   Specimen: BLOOD RIGHT HAND  Result Value Ref Range Status   Specimen Description BLOOD RIGHT HAND  Final   Special Requests   Final    Blood Culture adequate volume BOTTLES DRAWN AEROBIC AND ANAEROBIC   Culture   Final    NO GROWTH 2 DAYS Performed at [facility], [address], [place], [place] [address]    Report Status PENDING  Incomplete  Culture, blood (routine x 2)     Status: None (Preliminary result)   Collection Time: 01/28/19 12:11 AM   Specimen: BLOOD RIGHT WRIST  Result Value Ref Range Status   Specimen Description BLOOD RIGHT WRIST  Final   Special Requests   Final    BOTTLES DRAWN AEROBIC AND ANAEROBIC Blood Culture adequate volume   Culture   Final    NO GROWTH 2 DAYS Performed at [facility], [address], [place], [place] [address]    Report Status PENDING  Incomplete  Gastrointestinal Panel by PCR , Stool     Status: None   Collection Time: 01/28/19  1:09 AM   Specimen: Stool  Result Value Ref Range Status   Campylobacter species NOT DETECTED NOT DETECTED Final   Plesimonas shigelloides NOT DETECTED NOT DETECTED Final   Salmonella species NOT DETECTED NOT DETECTED Final   Yersinia enterocolitica NOT DETECTED NOT DETECTED Final   Vibrio species NOT DETECTED NOT DETECTED Final   Vibrio cholerae NOT DETECTED NOT DETECTED Final   Enteroaggregative E coli (EAEC) NOT DETECTED NOT DETECTED Final   Enteropathogenic E coli (EPEC) NOT DETECTED NOT DETECTED Final   Enterotoxigenic E coli (ETEC) NOT DETECTED NOT DETECTED Final   Shiga like toxin producing E coli (STEC) NOT DETECTED NOT DETECTED Final   Shigella/Enteroinvasive E coli (EIEC) NOT DETECTED NOT DETECTED Final   Cryptosporidium NOT DETECTED NOT DETECTED  Final   Cyclospora cayetanensis NOT DETECTED NOT DETECTED Final   Entamoeba histolytica NOT DETECTED NOT DETECTED Final   Giardia lamblia NOT DETECTED NOT DETECTED Final   Adenovirus F40/41 NOT DETECTED NOT DETECTED Final   Astrovirus NOT DETECTED NOT DETECTED Final   Norovirus GI/GII NOT DETECTED NOT DETECTED Final   Rotavirus A NOT DETECTED NOT DETECTED Final   Sapovirus (I, II, IV, and V) NOT DETECTED NOT DETECTED Final    Comment: Performed at [facility], [address] [place], [place] [address]  SARS Coronavirus 2 [facility] order, Performed in [place] hospital lab) Nasopharyngeal STOOL     Status: None   Collection Time: 01/28/19  5:16 AM   Specimen: STOOL; Nasopharyngeal  Result Value Ref Range Status   SARS Coronavirus 2 NEGATIVE NEGATIVE Final    Comment: (NOTE) If result  is NEGATIVE SARS-CoV-2 target nucleic acids are NOT DETECTED. The SARS-CoV-2 RNA is generally detectable in upper and lower  respiratory specimens during the acute phase of infection. The lowest  concentration of SARS-CoV-2 viral copies this assay can detect is 250  copies / mL. A negative result does not preclude SARS-CoV-2 infection  and should not be used as the sole basis for treatment or other  patient management decisions.  A negative result may occur with  improper specimen collection / handling, submission of specimen other  than nasopharyngeal swab, presence of viral mutation(s) within the  areas targeted by this assay, and inadequate number of viral copies  (<250 copies / mL). A negative result must be combined with clinical  observations, patient history, and epidemiological information. If result is POSITIVE SARS-CoV-2 target nucleic acids are DETECTED. The SARS-CoV-2 RNA is generally detectable in upper and lower  respiratory specimens dur ing the acute phase of infection.  Positive  results are indicative of active infection with SARS-CoV-2.  Clinical  correlation with  patient history and other diagnostic information is  necessary to determine patient infection status.  Positive results do  not rule out bacterial infection or co-infection with other viruses. If result is PRESUMPTIVE POSTIVE SARS-CoV-2 nucleic acids MAY BE PRESENT.   A presumptive positive result was obtained on the submitted specimen  and confirmed on repeat testing.  While 2019 novel coronavirus  (SARS-CoV-2) nucleic acids may be present in the submitted sample  additional confirmatory testing may be necessary for epidemiological  and / or clinical management purposes  to differentiate between  SARS-CoV-2 and other Sarbecovirus currently known to infect humans.  If clinically indicated additional testing with an alternate test  methodology [phone]) is advised. The SARS-CoV-2 RNA is generally  detectable in upper and lower respiratory sp ecimens during the acute  phase of infection. The expected result is Negative. Fact Sheet for Patients:  StrictlyIdeas.no Fact Sheet for Healthcare Providers: BankingDealers.co.za This test is not yet approved or cleared by the [place] FDA and has been authorized for detection and/or diagnosis of SARS-CoV-2 by FDA under an Emergency Use Authorization (EUA).  This EUA will remain in effect (meaning this test can be used) for the duration of the COVID-19 declaration under Section 564(b)(1) of the Act, 21 U.S.C. section 360bbb-3(b)(1), unless the authorization is terminated or revoked sooner. Performed at [facility], [address], [place], [place] [address]   C difficile quick scan w PCR reflex     Status: None   Collection Time: 01/28/19  5:23 AM   Specimen: STOOL  Result Value Ref Range Status   C Diff antigen NEGATIVE NEGATIVE Final   C Diff toxin NEGATIVE NEGATIVE Final   C Diff interpretation No C. difficile detected.  Final    Comment: Performed at [facility], [address],  [place], [place] [address]  MRSA PCR Screening     Status: None   Collection Time: 01/28/19 12:39 PM   Specimen: Nasal Mucosa; Nasopharyngeal  Result Value Ref Range Status   MRSA by PCR NEGATIVE NEGATIVE Final    Comment:        The GeneXpert MRSA Assay (FDA approved for NASAL specimens only), is one component of a comprehensive MRSA colonization surveillance program. It is not intended to diagnose MRSA infection nor to guide or monitor treatment for MRSA infections. Performed at [facility], [address], [place], [place] [address]      Studies: No results found.   Scheduled Meds: .  escitalopram  20 mg Oral Daily  . gabapentin  300 mg Oral QHS  . pantoprazole  40 mg Oral Daily  . scopolamine  1 patch Transdermal Q72H  . Tbo-filgastrim (GRANIX) SQ  480 mcg Subcutaneous q1800   Continuous Infusions: . 0.9 % NaCl with KCl 20 mEq / L 50 mL/hr at 01/30/19 0848  . ciprofloxacin Stopped (01/30/19 [id number])  . metronidazole 500 mg (01/30/19 1140)    Active Problems:   GERD (gastroesophageal reflux disease)   Severe protein-calorie malnutrition (HCC)   Sepsis (HCC)   Colitis   Chemotherapy-induced neutropenia (HCC)   Dehydration   Lactic acid acidosis   Nausea vomiting and diarrhea  Critical Care Time spent: 31 mins  Irwin Brakeman, MD [facility] 01/30/2019, 1:43 PM    LOS: 2 days  How to contact the [facility] Attending or Consulting provider [place] or covering provider during after hours [place], for this patient?  1. Check the care team in [facility] and look for a) attending/consulting TRH provider listed and b) the [facility] team listed 2. Log into [email or web address] and use [place]'s universal password to access. If you do not have the password, please contact the hospital operator. 3. Locate the [facility] provider you are looking for under [facility] and page to a number that you can be directly reached. 4. If you still have difficulty reaching the provider, please page the  [facility] (Director on Call) for the Hospitalists listed on amion for assistance.

## 2019-01-31 LAB — CBC WITH DIFFERENTIAL/PLATELET
Abs Immature Granulocytes: 0.85 10*3/uL — ABNORMAL HIGH (ref 0.00–0.07)
Basophils Absolute: 0.1 10*3/uL (ref 0.0–0.1)
Basophils Relative: 2 %
Eosinophils Absolute: 0 10*3/uL (ref 0.0–0.5)
Eosinophils Relative: 0 %
HCT: 23.8 % — ABNORMAL LOW (ref 36.0–46.0)
Hemoglobin: 7.4 g/dL — ABNORMAL LOW (ref 12.0–15.0)
Immature Granulocytes: 15 %
Lymphocytes Relative: 17 %
Lymphs Abs: 1 10*3/uL (ref 0.7–4.0)
MCH: 25.8 pg — ABNORMAL LOW (ref 26.0–34.0)
MCHC: 31.1 g/dL (ref 30.0–36.0)
MCV: 82.9 fL (ref 80.0–100.0)
Monocytes Absolute: 1.8 10*3/uL — ABNORMAL HIGH (ref 0.1–1.0)
Monocytes Relative: 32 %
Neutro Abs: 1.9 10*3/uL (ref 1.7–7.7)
Neutrophils Relative %: 34 %
Platelets: 146 10*3/uL — ABNORMAL LOW (ref 150–400)
RBC: 2.87 MIL/uL — ABNORMAL LOW (ref 3.87–5.11)
RDW: 18.4 % — ABNORMAL HIGH (ref 11.5–15.5)
WBC: 5.6 10*3/uL (ref 4.0–10.5)
nRBC: 11.1 % — ABNORMAL HIGH (ref 0.0–0.2)

## 2019-01-31 LAB — COMPREHENSIVE METABOLIC PANEL
ALT: 16 U/L (ref 0–44)
AST: 31 U/L (ref 15–41)
Albumin: 2.1 g/dL — ABNORMAL LOW (ref 3.5–5.0)
Alkaline Phosphatase: 95 U/L (ref 38–126)
Anion gap: 5 (ref 5–15)
BUN: 6 mg/dL (ref 6–20)
CO2: 25 mmol/L (ref 22–32)
Calcium: 7.8 mg/dL — ABNORMAL LOW (ref 8.9–10.3)
Chloride: 104 mmol/L (ref 98–111)
Creatinine, Ser: 0.63 mg/dL (ref 0.44–1.00)
GFR calc Af Amer: >60 mL/min (ref >60)
GFR calc non Af Amer: >60 mL/min (ref >60)
Glucose, Bld: 75 mg/dL (ref 70–99)
Potassium: 3.4 mmol/L — ABNORMAL LOW (ref 3.5–5.1)
Sodium: 134 mmol/L — ABNORMAL LOW (ref 135–145)
Total Bilirubin: 1.1 mg/dL (ref 0.3–1.2)
Total Protein: 4.8 g/dL — ABNORMAL LOW (ref 6.5–8.1)

## 2019-01-31 LAB — MAGNESIUM: Magnesium: 1.7 mg/dL (ref 1.7–2.4)

## 2019-01-31 MED ORDER — METRONIDAZOLE 500 MG PO TABS
500.0000 mg | ORAL_TABLET | Freq: Three times a day (TID) | ORAL | Status: DC
  Administered 2019-01-31 – 2019-02-02 (×7): 500 mg via ORAL
  Filled 2019-01-31 (×7): qty 1

## 2019-01-31 MED ORDER — CHLORHEXIDINE GLUCONATE CLOTH 2 % EX PADS
6.0000 | MEDICATED_PAD | Freq: Every day | CUTANEOUS | Status: DC
  Administered 2019-01-31 – 2019-02-02 (×3): 6 via TOPICAL

## 2019-01-31 MED ORDER — MAGNESIUM SULFATE 2 GM/50ML IV SOLN
2.0000 g | Freq: Once | INTRAVENOUS | Status: AC
  Administered 2019-01-31: 2 g via INTRAVENOUS
  Filled 2019-01-31: qty 50

## 2019-01-31 MED ORDER — POTASSIUM CHLORIDE CRYS ER 20 MEQ PO TBCR
40.0000 meq | EXTENDED_RELEASE_TABLET | Freq: Once | ORAL | Status: AC
  Administered 2019-01-31: 40 meq via ORAL
  Filled 2019-01-31: qty 2

## 2019-01-31 NOTE — Progress Notes (Signed)
PROGRESS NOTE    Pamela Long  [id number]  DOB: 1970/09/30  DOA: 01/27/2019 PCP: Doree Albee, MD   Brief Admission Hx: 48 year old female currently being treated for metastatic breast cancer at her last cycle of chemotherapy with paclitaxel on 01/21/2019.  She presented to the ED with complaints of fever diarrhea and left lower abdominal pain and was diagnosed with sepsis and colitis in addition to neutropenia.   MDM/Assessment & Plan:   1. Sigmoid colitis-patient continues to improve with IV fluids and IV antibiotics.  She is tolerating diet better with less abdominal pain.  Continue current management.  Advance to regular diet today.  2. Sepsis secondary to colitis -treated with IV fluid hydration IV antibiotics and lactic acid has trended down. 3. Hypokalemia/Hypomagnesemia - additional replacement ordered. Follow.  4. Hyperbilirubinemia-patient is status post having a stent in the hepatic duct placed by Dr. Laural Golden.  Her bilirubin is trended down to 1.1 today which is reassuring. 5. Pancytopenia / Neutropenia secondary to chemotherapy-discussed with Dr. [name] and patient is to receive Neupogen 480 mg subcu daily if the [place] is less than 1500.  WBC is 5.6 today and Hg stable at 7.4.  Platelets 146.  DC neupogen.    6. Malignant ascites-patient is maintained on spironolactone and Lasix as an outpatient.  We are temporarily holding diuretics as she is dehydrated but plan to resume as an outpatient. 7. Sinus tachycardia-slowly improving, likely secondary to malignancy, dehydration and acute infection. Improving with treatments.   DVT prophylaxis: SCDs Code Status: Full Family Communication: Patient at bedside / unable to reach daughter telephone Disposition Plan: Continue inpatient treatments for IV fluids, IV antibiotics, following labs closely, Ambulate today in preparation for DC in 1-2 days if remains stable  Consultants:  Oncology  Procedures:     Antimicrobials:  Ciprofloxacin/metronidazole 01/28/19 >>  Subjective: Patient says she is less weak and wants to try eating more today.   Objective: Vitals:   01/31/19 0500 01/31/19 0600 01/31/19 0700 01/31/19 0800  BP: (!) 86/54 (!) 87/56 (!) 89/57 93/62  Pulse: (!) 103 (!) 102 100 (!) 101  Resp: 20 18 17 16   Temp:      TempSrc:      SpO2: 95% 96% 96% 96%  Weight: 76.8 kg     Height:        Intake/Output Summary (Last 24 hours) at 01/31/2019 0930 Last data filed at 01/31/2019 0800 Gross per 24 hour  Intake 2004.44 ml  Output -  Net 2004.44 ml   Filed Weights   01/28/19 1232 01/29/19 0500 01/31/19 0500  Weight: 76.3 kg 76.8 kg 76.8 kg   REVIEW OF SYSTEMS  As per history otherwise all reviewed and reported negative  Exam:  General exam: Chronically ill-appearing female lying in the bed she is awake and alert in no apparent distress. Respiratory system: Clear. No increased work of breathing. Cardiovascular system: S1 & S2 heard.  Improving tachycardia.  No JVD, murmurs, gallops, clicks or pedal edema. Gastrointestinal system: Abdomen is nondistended, soft and mild LLQ tenderness. Normal bowel sounds heard. Central nervous system: Alert and oriented. No focal neurological deficits. Extremities: no CCE.  SCDs.  Data Reviewed: Basic Metabolic Panel: Recent Labs  Lab 01/27/19 2330 01/29/19 0449 01/30/19 0515 01/31/19 0503  NA 133* 133* 133* 134*  K 3.9 2.9* 3.1* 3.4*  CL 97* 103 104 104  CO2 25 24 23 25   GLUCOSE 146* 87 78 75  BUN 17 9 7 6   CREATININE 0.87 0.51  0.55 0.63  CALCIUM 9.1 7.7* 7.6* 7.8*  MG 1.6* 1.5* 1.7 1.7   Liver Function Tests: Recent Labs  Lab 01/27/19 2330 01/29/19 0449 01/30/19 0515 01/31/19 0503  AST 45* 25 24 31   ALT 25 19 17 16   ALKPHOS 125 78 85 95  BILITOT 2.3* 1.3* 1.1 1.1  PROT 7.1 4.7* 4.7* 4.8*  ALBUMIN 3.1* 2.2* 2.1* 2.1*   Recent Labs  Lab 01/27/19 2330  LIPASE 18   No results for input(s): AMMONIA in the last 168  hours. CBC: Recent Labs  Lab 01/27/19 2330 01/29/19 0449 01/29/19 0628 01/30/19 0515 01/31/19 0503  WBC 1.1* 0.6* 0.8* 1.2* 5.6  NEUTROABS 0.5* 0.1* 0.1* 0.3* 1.9  HGB 11.3* 7.3* 7.2* 7.5* 7.4*  HCT 35.6* 23.0* 22.6* 23.7* 23.8*  MCV 81.8 80.7 81.3 82.0 82.9  PLT 212 87* 89* 105* 146*   Cardiac Enzymes: No results for input(s): CKTOTAL, CKMB, CKMBINDEX, TROPONINI in the last 168 hours. CBG (last 3)  No results for input(s): GLUCAP in the last 72 hours. Recent Results (from the past 240 hour(s))  Culture, blood (routine x 2)     Status: None (Preliminary result)   Collection Time: 01/27/19 11:57 PM   Specimen: BLOOD RIGHT HAND  Result Value Ref Range Status   Specimen Description BLOOD RIGHT HAND  Final   Special Requests   Final    Blood Culture adequate volume BOTTLES DRAWN AEROBIC AND ANAEROBIC   Culture   Final    NO GROWTH 2 DAYS Performed at [facility], [address], [place], [place] [address]    Report Status PENDING  Incomplete  Culture, blood (routine x 2)     Status: None (Preliminary result)   Collection Time: 01/28/19 12:11 AM   Specimen: BLOOD RIGHT WRIST  Result Value Ref Range Status   Specimen Description BLOOD RIGHT WRIST  Final   Special Requests   Final    BOTTLES DRAWN AEROBIC AND ANAEROBIC Blood Culture adequate volume   Culture   Final    NO GROWTH 2 DAYS Performed at [facility], [address], [place], [place] [address]    Report Status PENDING  Incomplete  Gastrointestinal Panel by PCR , Stool     Status: None   Collection Time: 01/28/19  1:09 AM   Specimen: Stool  Result Value Ref Range Status   Campylobacter species NOT DETECTED NOT DETECTED Final   Plesimonas shigelloides NOT DETECTED NOT DETECTED Final   Salmonella species NOT DETECTED NOT DETECTED Final   Yersinia enterocolitica NOT DETECTED NOT DETECTED Final   Vibrio species NOT DETECTED NOT DETECTED Final   Vibrio cholerae NOT DETECTED NOT DETECTED Final   Enteroaggregative E  coli (EAEC) NOT DETECTED NOT DETECTED Final   Enteropathogenic E coli (EPEC) NOT DETECTED NOT DETECTED Final   Enterotoxigenic E coli (ETEC) NOT DETECTED NOT DETECTED Final   Shiga like toxin producing E coli (STEC) NOT DETECTED NOT DETECTED Final   Shigella/Enteroinvasive E coli (EIEC) NOT DETECTED NOT DETECTED Final   Cryptosporidium NOT DETECTED NOT DETECTED Final   Cyclospora cayetanensis NOT DETECTED NOT DETECTED Final   Entamoeba histolytica NOT DETECTED NOT DETECTED Final   Giardia lamblia NOT DETECTED NOT DETECTED Final   Adenovirus F40/41 NOT DETECTED NOT DETECTED Final   Astrovirus NOT DETECTED NOT DETECTED Final   Norovirus GI/GII NOT DETECTED NOT DETECTED Final   Rotavirus A NOT DETECTED NOT DETECTED Final   Sapovirus (I, II, IV, and V) NOT DETECTED NOT DETECTED Final  Comment: Performed at [facility], [address] [place], [place] [address]  SARS Coronavirus 2 [facility] order, Performed in [facility] hospital lab) Nasopharyngeal STOOL     Status: None   Collection Time: 01/28/19  5:16 AM   Specimen: STOOL; Nasopharyngeal  Result Value Ref Range Status   SARS Coronavirus 2 NEGATIVE NEGATIVE Final    Comment: (NOTE) If result is NEGATIVE SARS-CoV-2 target nucleic acids are NOT DETECTED. The SARS-CoV-2 RNA is generally detectable in upper and lower  respiratory specimens during the acute phase of infection. The lowest  concentration of SARS-CoV-2 viral copies this assay can detect is 250  copies / mL. A negative result does not preclude SARS-CoV-2 infection  and should not be used as the sole basis for treatment or other  patient management decisions.  A negative result may occur with  improper specimen collection / handling, submission of specimen other  than nasopharyngeal swab, presence of viral mutation(s) within the  areas targeted by this assay, and inadequate number of viral copies  (<250 copies / mL). A negative result must be combined with clinical   observations, patient history, and epidemiological information. If result is POSITIVE SARS-CoV-2 target nucleic acids are DETECTED. The SARS-CoV-2 RNA is generally detectable in upper and lower  respiratory specimens dur ing the acute phase of infection.  Positive  results are indicative of active infection with SARS-CoV-2.  Clinical  correlation with patient history and other diagnostic information is  necessary to determine patient infection status.  Positive results do  not rule out bacterial infection or co-infection with other viruses. If result is PRESUMPTIVE POSTIVE SARS-CoV-2 nucleic acids MAY BE PRESENT.   A presumptive positive result was obtained on the submitted specimen  and confirmed on repeat testing.  While 2019 novel coronavirus  (SARS-CoV-2) nucleic acids may be present in the submitted sample  additional confirmatory testing may be necessary for epidemiological  and / or clinical management purposes  to differentiate between  SARS-CoV-2 and other Sarbecovirus currently known to infect humans.  If clinically indicated additional testing with an alternate test  methodology [phone]) is advised. The SARS-CoV-2 RNA is generally  detectable in upper and lower respiratory sp ecimens during the acute  phase of infection. The expected result is Negative. Fact Sheet for Patients:  StrictlyIdeas.no Fact Sheet for Healthcare Providers: BankingDealers.co.za This test is not yet approved or cleared by the [place] FDA and has been authorized for detection and/or diagnosis of SARS-CoV-2 by FDA under an Emergency Use Authorization (EUA).  This EUA will remain in effect (meaning this test can be used) for the duration of the COVID-19 declaration under Section 564(b)(1) of the Act, 21 U.S.C. section 360bbb-3(b)(1), unless the authorization is terminated or revoked sooner. Performed at [facility], [address]  [place], [place] [address]   C difficile quick scan w PCR reflex     Status: None   Collection Time: 01/28/19  5:23 AM   Specimen: STOOL  Result Value Ref Range Status   C Diff antigen NEGATIVE NEGATIVE Final   C Diff toxin NEGATIVE NEGATIVE Final   C Diff interpretation No C. difficile detected.  Final    Comment: Performed at [facility], [address] [place], [place] [address]  MRSA PCR Screening     Status: None   Collection Time: 01/28/19 12:39 PM   Specimen: Nasal Mucosa; Nasopharyngeal  Result Value Ref Range Status   MRSA by PCR NEGATIVE NEGATIVE Final    Comment:  The GeneXpert MRSA Assay (FDA approved for NASAL specimens only), is one component of a comprehensive MRSA colonization surveillance program. It is not intended to diagnose MRSA infection nor to guide or monitor treatment for MRSA infections. Performed at [facility], [address] [place], [place] [address]      Studies: No results found.   Scheduled Meds: . Chlorhexidine Gluconate Cloth  6 each Topical Q0600  . escitalopram  20 mg Oral Daily  . gabapentin  300 mg Oral QHS  . metroNIDAZOLE  500 mg Oral Q8H  . pantoprazole  40 mg Oral Daily  . scopolamine  1 patch Transdermal Q72H   Continuous Infusions: . 0.9 % NaCl with KCl 20 mEq / L 50 mL/hr at 01/31/19 0132  . ciprofloxacin 400 mg (01/31/19 0134)    Active Problems:   GERD (gastroesophageal reflux disease)   Severe protein-calorie malnutrition (HCC)   Sepsis (HCC)   Colitis   Chemotherapy-induced neutropenia (HCC)   Dehydration   Lactic acid acidosis   Nausea vomiting and diarrhea  Critical Care Time spent: 30 mins  Irwin Brakeman, MD [facility] 01/31/2019, 9:30 AM    LOS: 3 days  How to contact the [facility] Attending or Consulting provider [place] or covering provider during after hours [place], for this patient?  1. Check the care team in [facility] and look for a) attending/consulting TRH provider listed and b) the [facility] team  listed 2. Log into [email or web address] and use [place]'s universal password to access. If you do not have the password, please contact the hospital operator. 3. Locate the [facility] provider you are looking for under [facility] and page to a number that you can be directly reached. 4. If you still have difficulty reaching the provider, please page the [facility] (Director on Call) for the Hospitalists listed on amion for assistance.

## 2019-02-01 LAB — BASIC METABOLIC PANEL
Anion gap: 6 (ref 5–15)
BUN: 6 mg/dL (ref 6–20)
CO2: 23 mmol/L (ref 22–32)
Calcium: 8 mg/dL — ABNORMAL LOW (ref 8.9–10.3)
Chloride: 105 mmol/L (ref 98–111)
Creatinine, Ser: 0.63 mg/dL (ref 0.44–1.00)
GFR calc Af Amer: >60 mL/min (ref >60)
GFR calc non Af Amer: >60 mL/min (ref >60)
Glucose, Bld: 99 mg/dL (ref 70–99)
Potassium: 3.5 mmol/L (ref 3.5–5.1)
Sodium: 134 mmol/L — ABNORMAL LOW (ref 135–145)

## 2019-02-01 LAB — CBC WITH DIFFERENTIAL/PLATELET
Abs Immature Granulocytes: 2.55 10*3/uL — ABNORMAL HIGH (ref 0.00–0.07)
Basophils Absolute: 0.1 10*3/uL (ref 0.0–0.1)
Basophils Relative: 0 %
Eosinophils Absolute: 0 10*3/uL (ref 0.0–0.5)
Eosinophils Relative: 0 %
HCT: 24.8 % — ABNORMAL LOW (ref 36.0–46.0)
Hemoglobin: 7.9 g/dL — ABNORMAL LOW (ref 12.0–15.0)
Immature Granulocytes: 15 %
Lymphocytes Relative: 9 %
Lymphs Abs: 1.6 10*3/uL (ref 0.7–4.0)
MCH: 25.9 pg — ABNORMAL LOW (ref 26.0–34.0)
MCHC: 31.9 g/dL (ref 30.0–36.0)
MCV: 81.3 fL (ref 80.0–100.0)
Monocytes Absolute: 3.6 10*3/uL — ABNORMAL HIGH (ref 0.1–1.0)
Monocytes Relative: 20 %
Neutro Abs: 9.7 10*3/uL — ABNORMAL HIGH (ref 1.7–7.7)
Neutrophils Relative %: 56 %
Platelets: 169 10*3/uL (ref 150–400)
RBC: 3.05 MIL/uL — ABNORMAL LOW (ref 3.87–5.11)
RDW: 19.4 % — ABNORMAL HIGH (ref 11.5–15.5)
WBC: 17.5 10*3/uL — ABNORMAL HIGH (ref 4.0–10.5)
nRBC: 3.3 % — ABNORMAL HIGH (ref 0.0–0.2)

## 2019-02-01 LAB — LACTIC ACID, PLASMA: Lactic Acid, Venous: 1.9 mmol/L (ref 0.5–1.9)

## 2019-02-01 LAB — MAGNESIUM: Magnesium: 1.7 mg/dL (ref 1.7–2.4)

## 2019-02-01 MED ORDER — POTASSIUM CHLORIDE CRYS ER 20 MEQ PO TBCR
20.0000 meq | EXTENDED_RELEASE_TABLET | Freq: Two times a day (BID) | ORAL | Status: AC
  Administered 2019-02-01 (×2): 20 meq via ORAL
  Filled 2019-02-01 (×2): qty 1

## 2019-02-01 MED ORDER — ADULT MULTIVITAMIN W/MINERALS CH
1.0000 | ORAL_TABLET | Freq: Every day | ORAL | Status: DC
  Administered 2019-02-01 – 2019-02-02 (×2): 1 via ORAL
  Filled 2019-02-01 (×2): qty 1

## 2019-02-01 MED ORDER — MEGESTROL ACETATE 400 MG/10ML PO SUSP
800.0000 mg | Freq: Every day | ORAL | Status: DC
  Filled 2019-02-01: qty 20

## 2019-02-01 MED ORDER — ENSURE ENLIVE PO LIQD
237.0000 mL | Freq: Two times a day (BID) | ORAL | Status: DC
  Administered 2019-02-01 – 2019-02-02 (×2): 237 mL via ORAL

## 2019-02-01 MED ORDER — MAGNESIUM SULFATE 2 GM/50ML IV SOLN
2.0000 g | Freq: Once | INTRAVENOUS | Status: AC
  Administered 2019-02-01: 2 g via INTRAVENOUS
  Filled 2019-02-01: qty 50

## 2019-02-01 NOTE — Progress Notes (Signed)
Initial Nutrition Assessment  DOCUMENTATION CODES:   Obesity unspecified  INTERVENTION:  -Ensure Enlive po BID, each supplement provides 350 kcal and 20 grams of protein (chocolate) -Magic cup TID with meals, each supplement provides 290 kcal and 9 grams of protein (butter pecan)  -MVI daily -Recommend appetite stimulant to assist with pt reported history of loss of appetite   NUTRITION DIAGNOSIS:   Increased nutrient needs related to cancer and cancer related treatments as evidenced by estimated needs.   GOAL:   Patient will meet greater than or equal to 90% of their needs   MONITOR:   PO intake, Supplement acceptance, I & O's, Labs, Weight trends  REASON FOR ASSESSMENT:   Consult Assessment of nutrition requirement/status  ASSESSMENT:  47 year old female with medical history significant for metastatic breast cancer s/p mastectomy in 2017- last chemotherapy with paclitaxel 8/13, GERD; presented to ED with complaints of fever, diarrhea, left lower abdominal pain. Pt admitted with sepsis, colitis, and neutropenia.   Per chart review, pt reports feeling less weak today and wants to try eating more today. Diet advanced to regular on 8/23. Documented with 60-90% of meals since admission  Patient resting at time of RD visit this morning. She reports feeling a little better after putting something on her stomach this morning; pt recalls eating a blueberry muffin for breakfast. RD observed 50% of small blueberry muffin on bedside table. Patient reports ongoing poor po and stated that "she has not felt hungry in quite a long time" Patient denies being on appetite stimulant and reports that she would like to enjoy food again.   Patient noted with moderate fat depletion to orbital and buccal regions; mild muscle depletion to temple and clavicle noted on physical assessment (see below) Patient with increased risk for severe malnutrition RD educated on the importance of adequate intake  of calories and protein and encouraged daily use of ONS. Patient agreeable to chocolate Ensure and Butter [employer] Cup with meals.   Current wt 84.6 kg (186.1 lb) Patient appears to fluctuate +/- 20 lbs per weight history; 6/18 pt wt 174.5 lb, 7/16 pt wt 203.5 lb  Medications reviewed and include: lexapro, gabapentin, flagyl, protonix, scoplamine, cipro, fentanyl, zofran, NaCL with KCl 20 mEq @ 10 ml/hr  Labs: Na 134 - addressing WBC 17.5 - trending up Hemoglobin 7.9 - trending up   NUTRITION - FOCUSED PHYSICAL EXAM:    Most Recent Value  Orbital Region  Moderate depletion  Upper Arm Region  Unable to assess  Thoracic and Lumbar Region  Unable to assess  Buccal Region  Moderate depletion  Temple Region  Mild depletion  Clavicle Bone Region  Mild depletion  Clavicle and Acromion Bone Region  Unable to assess  Scapular Bone Region  Unable to assess  Dorsal Hand  Mild depletion  Patellar Region  Unable to assess  Anterior Thigh Region  Unable to assess  Posterior Calf Region  Unable to assess  Edema (RD Assessment)  None  Hair  Reviewed  Eyes  Reviewed  Mouth  Reviewed  Skin  Reviewed [pale]  Nails  Reviewed       Diet Order:   Diet Order            Diet regular Room service appropriate? Yes; Fluid consistency: Thin  Diet effective now              EDUCATION NEEDS:   Education needs have been addressed  Skin:  Skin Assessment: Reviewed RN Assessment  Last BM:  8/20  Height:   Ht Readings from Last 1 Encounters:  01/28/19 5\' 6"  (1.676 m)    Weight:   Wt Readings from Last 1 Encounters:  02/01/19 84.6 kg    Ideal Body Weight:  59.9 kg  BMI:  Body mass index is 30.1 kg/m.  Estimated Nutritional Needs:   Kcal:  2400-2600  Protein:  120-130  Fluid:  >2.4L/day    Lajuan Lines, RD, LDN Office [phone] After Hours/Weekend Pager: [phone]

## 2019-02-01 NOTE — TOC Initial Note (Signed)
Transition of Care [facility]) - Initial/Assessment Note    Patient Details  Name: Pamela Long MRN: [id number] Date of Birth: Jan 14, 1971  Transition of Care [facility]) CM/SW Contact:    Boneta Lucks, RN Phone Number: 02/01/2019, 11:49 AM  Clinical Narrative:    Patient admitted for colitis. Patient has high readmission score. Patient is seeing oncology lives at home with her daughter.  Patient is active with [facility] for PT and wants to continue. Last visit patient was also set up with support services for SW, Chaplain and monthly nurse call. She states no one ever contacted her, she does not wish for TOC to call them again. No other needs identified. TOC to follow.               Expected Discharge Plan: [place] Barriers to Discharge: No Barriers Identified   Patient Goals and CMS Choice Patient states their goals for this hospitalization and ongoing recovery are:: to go back home.      Expected Discharge Plan and Services Expected Discharge Plan: [place]    Prior Living Arrangements/Services   Lives with:: Adult Children Patient language and need for interpreter reviewed:: Yes Do you feel safe going back to the place where you live?: Yes      Need for Family Participation in Patient Care: Yes (Comment) Care giver support system in place?: Yes (comment) Current home services: DME, Home PT Criminal Activity/Legal Involvement Pertinent to Current Situation/Hospitalization: No - Comment as needed  Activities of Daily Living Home Assistive Devices/Equipment: Cane (specify quad or straight) ADL Screening (condition at time of admission) Patient's cognitive ability adequate to safely complete daily activities?: Yes Is the patient deaf or have difficulty hearing?: No Does the patient have difficulty seeing, even when wearing glasses/contacts?: No Does the patient have difficulty concentrating, remembering, or making decisions?: No Patient  able to express need for assistance with ADLs?: No Does the patient have difficulty dressing or bathing?: No Independently performs ADLs?: Yes (appropriate for developmental age) Does the patient have difficulty walking or climbing stairs?: No Weakness of Legs: Both Weakness of Arms/Hands: None   Emotional Assessment     Affect (typically observed): Accepting Orientation: : Oriented to Self, Oriented to Place, Oriented to  Time, Oriented to Situation Alcohol / Substance Use: Not Applicable Psych Involvement: No (comment)  Admission diagnosis:  Dehydration [E86.0] Colitis [K52.9] Rectal bleeding [K62.5] Lactic acid acidosis [E87.2] Chemotherapy-induced neutropenia (HCC) [D70.1, T45.1X5A] Nausea vomiting and diarrhea [R11.2, R19.7] Patient Active Problem List   Diagnosis Date Noted  . Colitis 01/28/2019  . Chemotherapy-induced neutropenia ([place])   . Dehydration   . Lactic acid acidosis   . Nausea vomiting and diarrhea   . Status post thoracentesis   . Palliative care by specialist   . Community acquired pneumonia of left lung   . Acute on chronic respiratory failure with hypoxia ([place]) 12/13/2018  . Sepsis due to undetermined organism ([place]) 12/13/2018  . Pleural effusion on left   . HCAP (healthcare-associated pneumonia) 12/12/2018  . Febrile neutropenia ([place]) 12/12/2018  . Hypokalemia 12/12/2018  . Hyponatremia 12/12/2018  . Severe protein-calorie malnutrition ([place]) 12/12/2018  . Sepsis ([place]) 12/12/2018  . Obstructive jaundice 09/21/2018  . Goals of care, counseling/discussion 09/17/2018  . Abdominal pain 09/09/2018  . RUQ pain 07/08/2018  . Metastatic breast cancer ([place]) 07/02/2018  . Liver mass 06/27/2018  . GERD (gastroesophageal reflux disease) 04/02/2018  . Rectal bleeding 04/02/2018  . Constipation 04/02/2018  .  Acquired absence of left breast 08/01/2017  . OAB (overactive bladder) 07/31/2017  . Vitamin D deficiency 02/26/2017  . Prediabetes 02/26/2017  . HLD  (hyperlipidemia) 02/26/2017  . Asthma 02/12/2017  . Depression 02/12/2017  . Migraines 02/12/2017  . Arthralgia 02/12/2017  . Sleep-disordered breathing 02/12/2017  . Positive ANA (antinuclear antibody) 02/12/2017  . Angular cheilitis 02/12/2017  . Class 2 obesity due to excess calories without serious comorbidity with body mass index (BMI) of 35.0 to 35.9 in adult 02/12/2017  . Malignant neoplasm of central portion of left breast in female, estrogen receptor positive ([place]) 01/05/2017   PCP:  Doree Albee, MD Pharmacy:   [id number] - [place], [address] [place] [address] Phone: [phone] Fax: [place], [address] [place] [address] Phone: [phone] Fax: [place], [address] [place] [address] Phone: [phone] Fax: [phone]     Social Determinants of Health (SDOH) Interventions    Readmission Risk Interventions Readmission Risk Prevention Plan 02/01/2019 12/16/2018 12/15/2018  Transportation Screening Complete - Complete  PCP or Specialist Appt within 3-5 Days - Complete -  HRI or [place] - Complete -  Social Work Consult for [place] Planning/Counseling - Complete -  Palliative Care Screening - Complete -  Medication Review Press photographer) Complete Complete Complete  PCP or Specialist appointment within 3-5 days of discharge Not Complete - -  [place] or Home Care Consult Complete - Not Complete  HRI or Home Care Consult Pt Refusal Comments - - SNF referral  SW Recovery Care/Counseling Consult Complete - Complete  Palliative Care Screening Not Complete - Complete  Skilled Nursing Facility Not Complete - Complete

## 2019-02-01 NOTE — Progress Notes (Addendum)
PROGRESS NOTE    Pamela Long  [id number]  DOB: [birth year]  DOA: 01/27/2019 PCP: Doree Albee, MD   Brief Admission Hx: 48 year old female currently being treated for metastatic breast cancer at her last cycle of chemotherapy with paclitaxel on 01/21/2019.  She presented to the ED with complaints of fever diarrhea and left lower abdominal pain and was diagnosed with sepsis and colitis in addition to neutropenia.   MDM/Assessment & Plan:   1. Sigmoid colitis-patient continues to improve with IV fluids and IV antibiotics but difficulty with tolerating diet.  She is having stomach upset with small amounts of food.  Still requiring IV zofran and IV pain medication.   Continue current management.  Dietitian consult to help with improving nutrition.   2. Sepsis secondary to colitis -treated with IV fluid hydration IV antibiotics and lactic acid has trended down to normal. 3. Hypokalemia/Hypomagnesemia - additional replacement ordered. Follow.  4. Hyperbilirubinemia-patient is status post having a stent in the hepatic duct placed by Dr. Laural Golden.  Her bilirubin is trended down to 1.1 today which is reassuring. 5. Pancytopenia / Neutropenia secondary to chemotherapy-discussed with Dr. [name] and patient is to receive Neupogen 480 mg subcu daily if the [place] is less than 1500.  WBC is 12 today and Hg stable at 7.9.  Platelets 169.  Discontinued neupogen.    6. Malignant ascites-patient is maintained on spironolactone and Lasix as an outpatient.  We are temporarily holding diuretics as she is dehydrated but plan to resume as an outpatient. 7. Sinus tachycardia-slowly improving, likely secondary to malignancy, dehydration and acute infection. Improving with treatments.  8. Hypotension - chronic and asymptomatic.   DVT prophylaxis: SCDs Code Status: Full Family Communication: Patient at bedside / unable to reach daughter telephone Disposition Plan: Continue inpatient treatments for IV  fluids, IV antibiotics, following labs closely, Ambulate today in preparation for DC 8/25 if improved  Consultants:  Oncology  Procedures:    Antimicrobials:  Ciprofloxacin/metronidazole 01/28/19 >>  Subjective: Patient reports poor appetite and stomach upset when eating small amount of food.    Objective: Vitals:   02/01/19 0400 02/01/19 0500 02/01/19 0751 02/01/19 0840  BP: (!) 87/54     Pulse: 95  (!) 129   Resp: 17  (!) 22   Temp: 98.9 F (37.2 C)  98.3 F (36.8 C)   TempSrc: Oral  Oral   SpO2: 94%  95% 97%  Weight:  84.6 kg    Height:        Intake/Output Summary (Last 24 hours) at 02/01/2019 0930 Last data filed at 01/31/2019 1833 Gross per 24 hour  Intake 1477.87 ml  Output -  Net 1477.87 ml   Filed Weights   01/29/19 0500 01/31/19 0500 02/01/19 0500  Weight: 76.8 kg 76.8 kg 84.6 kg   REVIEW OF SYSTEMS  As per history otherwise all reviewed and reported negative  Exam:  General exam: Chronically ill-appearing female lying in the bed she is awake and alert in no apparent distress. Respiratory system: Clear. No increased work of breathing. Cardiovascular system: S1 & S2 heard.  Improving tachycardia.  No JVD, murmurs, gallops, clicks or pedal edema. Gastrointestinal system: Abdomen is nondistended, soft and mild LLQ tenderness. Normal bowel sounds heard. Central nervous system: Alert and oriented. No focal neurological deficits. Extremities: no CCE.  SCDs.  Data Reviewed: Basic Metabolic Panel: Recent Labs  Lab 01/27/19 2330 01/29/19 0449 01/30/19 0515 01/31/19 0503 02/01/19 0414  NA 133* 133* 133* 134* 134*  K  3.9 2.9* 3.1* 3.4* 3.5  CL 97* 103 104 104 105  CO2 25 24 23 25 23   GLUCOSE 146* 87 78 75 99  BUN 17 9 7 6 6   CREATININE 0.87 0.51 0.55 0.63 0.63  CALCIUM 9.1 7.7* 7.6* 7.8* 8.0*  MG 1.6* 1.5* 1.7 1.7 1.7   Liver Function Tests: Recent Labs  Lab 01/27/19 2330 01/29/19 0449 01/30/19 0515 01/31/19 0503  AST 45* 25 24 31   ALT 25  19 17 16   ALKPHOS 125 78 85 95  BILITOT 2.3* 1.3* 1.1 1.1  PROT 7.1 4.7* 4.7* 4.8*  ALBUMIN 3.1* 2.2* 2.1* 2.1*   Recent Labs  Lab 01/27/19 2330  LIPASE 18   No results for input(s): AMMONIA in the last 168 hours. CBC: Recent Labs  Lab 01/29/19 0449 01/29/19 0628 01/30/19 0515 01/31/19 0503 02/01/19 0414  WBC 0.6* 0.8* 1.2* 5.6 17.5*  NEUTROABS 0.1* 0.1* 0.3* 1.9 9.7*  HGB 7.3* 7.2* 7.5* 7.4* 7.9*  HCT 23.0* 22.6* 23.7* 23.8* 24.8*  MCV 80.7 81.3 82.0 82.9 81.3  PLT 87* 89* 105* 146* 169   Cardiac Enzymes: No results for input(s): CKTOTAL, CKMB, CKMBINDEX, TROPONINI in the last 168 hours. CBG (last 3)  No results for input(s): GLUCAP in the last 72 hours. Recent Results (from the past 240 hour(s))  Culture, blood (routine x 2)     Status: None (Preliminary result)   Collection Time: 01/27/19 11:57 PM   Specimen: BLOOD RIGHT HAND  Result Value Ref Range Status   Specimen Description BLOOD RIGHT HAND  Final   Special Requests   Final    Blood Culture adequate volume BOTTLES DRAWN AEROBIC AND ANAEROBIC   Culture   Final    NO GROWTH 4 DAYS Performed at [facility], [address], [place], [place] [address]    Report Status PENDING  Incomplete  Culture, blood (routine x 2)     Status: None (Preliminary result)   Collection Time: 01/28/19 12:11 AM   Specimen: BLOOD RIGHT WRIST  Result Value Ref Range Status   Specimen Description BLOOD RIGHT WRIST  Final   Special Requests   Final    BOTTLES DRAWN AEROBIC AND ANAEROBIC Blood Culture adequate volume   Culture   Final    NO GROWTH 4 DAYS Performed at [facility], [address], [place], [place] [address]    Report Status PENDING  Incomplete  Gastrointestinal Panel by PCR , Stool     Status: None   Collection Time: 01/28/19  1:09 AM   Specimen: Stool  Result Value Ref Range Status   Campylobacter species NOT DETECTED NOT DETECTED Final   Plesimonas shigelloides NOT DETECTED NOT DETECTED Final   Salmonella  species NOT DETECTED NOT DETECTED Final   Yersinia enterocolitica NOT DETECTED NOT DETECTED Final   Vibrio species NOT DETECTED NOT DETECTED Final   Vibrio cholerae NOT DETECTED NOT DETECTED Final   Enteroaggregative E coli (EAEC) NOT DETECTED NOT DETECTED Final   Enteropathogenic E coli (EPEC) NOT DETECTED NOT DETECTED Final   Enterotoxigenic E coli (ETEC) NOT DETECTED NOT DETECTED Final   Shiga like toxin producing E coli (STEC) NOT DETECTED NOT DETECTED Final   Shigella/Enteroinvasive E coli (EIEC) NOT DETECTED NOT DETECTED Final   Cryptosporidium NOT DETECTED NOT DETECTED Final   Cyclospora cayetanensis NOT DETECTED NOT DETECTED Final   Entamoeba histolytica NOT DETECTED NOT DETECTED Final   Giardia lamblia NOT DETECTED NOT DETECTED Final   Adenovirus F40/41 NOT DETECTED NOT DETECTED Final  Astrovirus NOT DETECTED NOT DETECTED Final   Norovirus GI/GII NOT DETECTED NOT DETECTED Final   Rotavirus A NOT DETECTED NOT DETECTED Final   Sapovirus (I, II, IV, and V) NOT DETECTED NOT DETECTED Final    Comment: Performed at [facility], [address], [place], [place] [address]  SARS Coronavirus 2 [facility] order, Performed in [facility] hospital lab) Nasopharyngeal STOOL     Status: None   Collection Time: 01/28/19  5:16 AM   Specimen: STOOL; Nasopharyngeal  Result Value Ref Range Status   SARS Coronavirus 2 NEGATIVE NEGATIVE Final    Comment: (NOTE) If result is NEGATIVE SARS-CoV-2 target nucleic acids are NOT DETECTED. The SARS-CoV-2 RNA is generally detectable in upper and lower  respiratory specimens during the acute phase of infection. The lowest  concentration of SARS-CoV-2 viral copies this assay can detect is 250  copies / mL. A negative result does not preclude SARS-CoV-2 infection  and should not be used as the sole basis for treatment or other  patient management decisions.  A negative result may occur with  improper specimen collection / handling, submission of  specimen other  than nasopharyngeal swab, presence of viral mutation(s) within the  areas targeted by this assay, and inadequate number of viral copies  (<250 copies / mL). A negative result must be combined with clinical  observations, patient history, and epidemiological information. If result is POSITIVE SARS-CoV-2 target nucleic acids are DETECTED. The SARS-CoV-2 RNA is generally detectable in upper and lower  respiratory specimens dur ing the acute phase of infection.  Positive  results are indicative of active infection with SARS-CoV-2.  Clinical  correlation with patient history and other diagnostic information is  necessary to determine patient infection status.  Positive results do  not rule out bacterial infection or co-infection with other viruses. If result is PRESUMPTIVE POSTIVE SARS-CoV-2 nucleic acids MAY BE PRESENT.   A presumptive positive result was obtained on the submitted specimen  and confirmed on repeat testing.  While 2019 novel coronavirus  (SARS-CoV-2) nucleic acids may be present in the submitted sample  additional confirmatory testing may be necessary for epidemiological  and / or clinical management purposes  to differentiate between  SARS-CoV-2 and other Sarbecovirus currently known to infect humans.  If clinically indicated additional testing with an alternate test  methodology [phone]) is advised. The SARS-CoV-2 RNA is generally  detectable in upper and lower respiratory sp ecimens during the acute  phase of infection. The expected result is Negative. Fact Sheet for Patients:  StrictlyIdeas.no Fact Sheet for Healthcare Providers: BankingDealers.co.za This test is not yet approved or cleared by the [place] FDA and has been authorized for detection and/or diagnosis of SARS-CoV-2 by FDA under an Emergency Use Authorization (EUA).  This EUA will remain in effect (meaning this test can be used) for the  duration of the COVID-19 declaration under Section 564(b)(1) of the Act, 21 U.S.C. section 360bbb-3(b)(1), unless the authorization is terminated or revoked sooner. Performed at [facility], [address] [place], [place] [address]   C difficile quick scan w PCR reflex     Status: None   Collection Time: 01/28/19  5:23 AM   Specimen: STOOL  Result Value Ref Range Status   C Diff antigen NEGATIVE NEGATIVE Final   C Diff toxin NEGATIVE NEGATIVE Final   C Diff interpretation No C. difficile detected.  Final    Comment: Performed at [facility], [address], [place], [place] [address]  MRSA  PCR Screening     Status: None   Collection Time: 01/28/19 12:39 PM   Specimen: Nasal Mucosa; Nasopharyngeal  Result Value Ref Range Status   MRSA by PCR NEGATIVE NEGATIVE Final    Comment:        The GeneXpert MRSA Assay (FDA approved for NASAL specimens only), is one component of a comprehensive MRSA colonization surveillance program. It is not intended to diagnose MRSA infection nor to guide or monitor treatment for MRSA infections. Performed at [facility], [address] [place], [place] [address]      Studies: No results found.   Scheduled Meds: . Chlorhexidine Gluconate Cloth  6 each Topical Q0600  . escitalopram  20 mg Oral Daily  . gabapentin  300 mg Oral QHS  . metroNIDAZOLE  500 mg Oral Q8H  . pantoprazole  40 mg Oral Daily  . scopolamine  1 patch Transdermal Q72H   Continuous Infusions: . 0.9 % NaCl with KCl 20 mEq / L 10 mL/hr at 02/01/19 0756  . ciprofloxacin 400 mg (02/01/19 0150)    Active Problems:   GERD (gastroesophageal reflux disease)   Severe protein-calorie malnutrition (HCC)   Sepsis (HCC)   Colitis   Chemotherapy-induced neutropenia (HCC)   Dehydration   Lactic acid acidosis   Nausea vomiting and diarrhea  Critical Care Time spent: 33 mins  Irwin Brakeman, MD [facility] 02/01/2019, 9:30 AM    LOS: 4 days  How to contact the [facility]  Attending or Consulting provider [place] or covering provider during after hours [place], for this patient?  1. Check the care team in [facility] and look for a) attending/consulting TRH provider listed and b) the [facility] team listed 2. Log into [email or web address] and use [place]'s universal password to access. If you do not have the password, please contact the hospital operator. 3. Locate the [facility] provider you are looking for under [facility] and page to a number that you can be directly reached. 4. If you still have difficulty reaching the provider, please page the [facility] (Director on Call) for the Hospitalists listed on amion for assistance.

## 2019-02-02 LAB — CULTURE, BLOOD (ROUTINE X 2)
Culture: NO GROWTH
Culture: NO GROWTH
Special Requests: ADEQUATE
Special Requests: ADEQUATE

## 2019-02-02 MED ORDER — ADULT MULTIVITAMIN W/MINERALS CH: 1.0000 | ORAL_TABLET | Freq: Every day | ORAL | Status: DC

## 2019-02-02 MED ORDER — ENSURE ENLIVE PO LIQD: 237.0000 mL | Freq: Two times a day (BID) | ORAL | 0 refills | Status: AC

## 2019-02-02 MED ORDER — METRONIDAZOLE 500 MG PO TABS: 500.0000 mg | ORAL_TABLET | Freq: Three times a day (TID) | ORAL | 0 refills | Status: AC

## 2019-02-02 MED ORDER — CIPROFLOXACIN HCL 500 MG PO TABS: 500.0000 mg | ORAL_TABLET | Freq: Two times a day (BID) | ORAL | 0 refills | Status: AC

## 2019-02-02 NOTE — Discharge Summary (Signed)
Physician Discharge Summary  Pamela Long [id number] DOB: 1970-09-03 DOA: 01/27/2019  PCP: Doree Albee, MD Oncology: Reita Cliche date: 01/27/2019 Discharge date: 02/02/2019  Admitted From: Home  Disposition: Home with [facility]   Recommendations for Outpatient Follow-up:  1. Follow up with Dr. [name] in 1 weeks  Home Health: PT  Discharge Condition: STABLE   CODE STATUS: FULL    Brief Hospitalization Summary: Please see all hospital notes, images, labs for full details of the hospitalization. Pamela Long  is a 48 y.o. female, with history of metastatic breast cancer dating back to 2017 when she received neoadjuvant chemotherapy followed by mastectomy and antiestrogen therapy. From HPI:  Therapy was discontinued because of side effects.  Patient again presented to the ED in December 2019 with nausea vomiting and found to have large liver mass.  The mass was biopsied at [facility] in June 26, 2018 and turned out to be metastatic breast cancer.  She was started on chemotherapy by oncology.  She had an MRCP on 09/18/2018 which revealed very large liver lesion along with small lesion consistent with metastatic disease.  Study also showed dilated biliary system with cutoff in the region of proximal CBD consistent with stricture most likely due to adenopathy.  She had plastic stent placed in the hepatic duct with good drainage of bile on 09/22/2018.  Today patient came to the hospital with nausea vomiting and diarrhea.  Patient says that she had 7-8 loose BM at home.  She also noticed blood in the stool.  CT scan of the abdomen pelvis showed descending and sigmoid colitis.  Patient started on ciprofloxacin and Flagyl.  Lab work also showed neutropenia with WBC 1.1, patient received Neupogen 40 mcg subcutaneous x1 as per oncology Dr. [name] recommendation.  She denies chest pain or shortness of breath. Complains of cough with clear phlegm. Denies dysuria Complains of  abdominal pain No previous history of stroke or seizures.  Brief Admission Hx: 48 year old female currently being treated for metastatic breast cancer at her last cycle of chemotherapy with paclitaxel on 01/21/2019.  She presented to the ED with complaints of fever diarrhea and left lower abdominal pain and was diagnosed with sepsis and colitis in addition to neutropenia.   MDM/Assessment & Plan:   1. Sigmoid colitis-patient was treated with IV fluids and IV antibiotics and now is tolerating diet.  S  Dietitian consulted to help with improving nutrition.   2. Sepsis secondary to colitis -treated with IV fluid hydration IV antibiotics and lactic acid has trended down to normal. 3. Severe protein calorie malnutrition - supplements per dietitian, appetite stimulant ordered megestrol 800 mg daily.   4. Hypokalemia/Hypomagnesemia - repleted.  5. Hyperbilirubinemia-patient is status post having a stent in the hepatic duct placed by Dr. Laural Golden.  Her bilirubin is trended down to 1.1 today which is reassuring. 6. Pancytopenia / Neutropenia secondary to chemotherapy-discussed with Dr. [name] and patient received Neupogen 480 mg subcu daily until the [place] was greater than 1500.  WBC is 12 and Hg stable at 7.9.  Platelets 169.  Discontinued neupogen.    7. Malignant ascites-patient is maintained on spironolactone and Lasix as an outpatient.  We are temporarily holding diuretics as she is dehydrated but plan to resume as an outpatient when appropriate per Dr. [name]. 8. Sinus tachycardia-slowly improved, likely secondary to malignancy, dehydration and acute infection. Improving with treatments.  9. Hypotension -improved with hydration and eating better.   DVT prophylaxis: SCDs Code Status: Full Family Communication:  Patient at bedside, verbalized understanding Disposition Plan: home with close outpatient followup    Consultants:  Oncology  Procedures:    Antimicrobials:  Ciprofloxacin/metronidazole 01/28/19 >> Discharge Diagnoses:  Active Problems:   GERD (gastroesophageal reflux disease)   Severe protein-calorie malnutrition (HCC)   Sepsis (HCC)   Colitis   Chemotherapy-induced neutropenia (HCC)   Dehydration   Lactic acid acidosis   Nausea vomiting and diarrhea   Discharge Instructions:  Allergies as of 02/02/2019      Reactions   Salagen [pilocarpine] Other (See Comments)   Can cause liver failure    Tape Itching, Other (See Comments)   Depending on the adhesive-blistering occurs   Amoxicillin Hives, Other (See Comments)   Rash only DID THE REACTION INVOLVE: Swelling of the face/tongue/throat, SOB, or low BP? Sudden or severe rash/hives, skin peeling, or the inside of the mouth or nose?  Did it require medical treatment?  When did it last happen? If all above answers are "NO", may proceed with cephalosporin use.   Caffeine Diarrhea, Nausea Only, Palpitations, Other (See Comments)   Headache   Tetanus Toxoids Swelling, Other (See Comments)   Local reaction      Medication List    STOP taking these medications   furosemide 20 MG tablet Commonly known as: LASIX   nystatin 100000 UNIT/ML suspension Commonly known as: MYCOSTATIN   prochlorperazine 10 MG tablet Commonly known as: COMPAZINE   promethazine 25 MG tablet Commonly known as: PHENERGAN     TAKE these medications   acetaminophen 325 MG tablet Commonly known as: TYLENOL Take 2 tablets (650 mg total) by mouth every 6 (six) hours as needed for mild pain, fever or headache (T over 101).   ciprofloxacin 500 MG tablet Commonly known as: Cipro Take 1 tablet (500 mg total) by mouth 2 (two) times daily for 2 days.   escitalopram 20 MG tablet Commonly known as: LEXAPRO Take 1 tablet (20 mg total) by mouth daily.   feeding supplement (ENSURE ENLIVE) Liqd Take 237 mLs by mouth 2 (two) times daily  between meals.   gabapentin 300 MG capsule Commonly known as: NEURONTIN Take 1 capsule (300 mg total) by mouth at bedtime.   lidocaine-prilocaine cream Commonly known as: EMLA Apply 1 application topically as needed.   magic mouthwash w/lidocaine Soln Take 5 mLs by mouth 4 (four) times daily as needed for mouth pain.   magnesium oxide 400 MG tablet Commonly known as: MAG-OX TAKE 1 TABLET BY MOUTH EVERY DAY   metroNIDAZOLE 500 MG tablet Commonly known as: FLAGYL Take 1 tablet (500 mg total) by mouth every 8 (eight) hours for 2 days.   multivitamin with minerals Tabs tablet Take 1 tablet by mouth daily.   oxyCODONE-acetaminophen 5-325 MG tablet Commonly known as: Percocet Take 1 tablet by mouth every 4 (four) hours as needed for moderate pain.   pantoprazole 40 MG tablet Commonly known as: PROTONIX TAKE 1 TABLET BY MOUTH EVERY DAY BEFORE BREAKFAST   potassium chloride SA 20 MEQ tablet Commonly known as: K-DUR Take 2 tablets (40 mEq total) by mouth daily.   Refresh 1.4-0.6 % Soln Generic drug: Polyvinyl Alcohol-Povidone PF Place 1 drop into both eyes as needed (for dry eyes).   rizatriptan 10 MG disintegrating tablet Commonly known as: MAXALT-MLT Take 10 mg by mouth every 2 (two) hours as needed for migraine.   scopolamine 1 MG/3DAYS Commonly known as: TRANSDERM-SCOP Place 1 patch (1.5 mg total) onto the skin every 3 (three) days.  senna-docusate 8.6-50 MG tablet Commonly known as: Senokot-S Take 2 tablets by mouth at bedtime as needed for mild constipation or moderate constipation.   spironolactone 25 MG tablet Commonly known as: ALDACTONE TAKE 1 TABLET BY MOUTH EVERY DAY   TAXOL IV Inject into the vein once a week.   temazepam 15 MG capsule Commonly known as: RESTORIL Take 1 capsule (15 mg total) by mouth at bedtime as needed for sleep.       Allergies  Allergen Reactions  . Salagen [Pilocarpine] Other (See Comments)    Can cause liver failure   .  Tape Itching and Other (See Comments)    Depending on the adhesive-blistering occurs  . Amoxicillin Hives and Other (See Comments)    Rash only DID THE REACTION INVOLVE: Swelling of the face/tongue/throat, SOB, or low BP? Sudden or severe rash/hives, skin peeling, or the inside of the mouth or nose?  Did it require medical treatment?  When did it last happen? If all above answers are "NO", may proceed with cephalosporin use.   . Caffeine Diarrhea, Nausea Only, Palpitations and Other (See Comments)    Headache  . Tetanus Toxoids Swelling and Other (See Comments)    Local reaction   Allergies as of 02/02/2019      Reactions   Salagen [pilocarpine] Other (See Comments)   Can cause liver failure    Tape Itching, Other (See Comments)   Depending on the adhesive-blistering occurs   Amoxicillin Hives, Other (See Comments)   Rash only DID THE REACTION INVOLVE: Swelling of the face/tongue/throat, SOB, or low BP? Sudden or severe rash/hives, skin peeling, or the inside of the mouth or nose?  Did it require medical treatment?  When did it last happen? If all above answers are "NO", may proceed with cephalosporin use.   Caffeine Diarrhea, Nausea Only, Palpitations, Other (See Comments)   Headache   Tetanus Toxoids Swelling, Other (See Comments)   Local reaction      Medication List    STOP taking these medications   furosemide 20 MG tablet Commonly known as: LASIX   nystatin 100000 UNIT/ML suspension Commonly known as: MYCOSTATIN   prochlorperazine 10 MG tablet Commonly known as: COMPAZINE   promethazine 25 MG tablet Commonly known as: PHENERGAN     TAKE these medications   acetaminophen 325 MG tablet Commonly known as: TYLENOL Take 2 tablets (650 mg total) by mouth every 6 (six) hours as needed for mild pain, fever or headache (T over 101).   ciprofloxacin 500 MG tablet Commonly known as: Cipro Take 1 tablet (500 mg total) by mouth 2 (two) times daily for 2  days.   escitalopram 20 MG tablet Commonly known as: LEXAPRO Take 1 tablet (20 mg total) by mouth daily.   feeding supplement (ENSURE ENLIVE) Liqd Take 237 mLs by mouth 2 (two) times daily between meals.   gabapentin 300 MG capsule Commonly known as: NEURONTIN Take 1 capsule (300 mg total) by mouth at bedtime.   lidocaine-prilocaine cream Commonly known as: EMLA Apply 1 application topically as needed.   magic mouthwash w/lidocaine Soln Take 5 mLs by mouth 4 (four) times daily as needed for mouth pain.   magnesium oxide 400 MG tablet Commonly known as: MAG-OX TAKE 1 TABLET BY MOUTH EVERY DAY   metroNIDAZOLE 500 MG tablet Commonly known as: FLAGYL Take 1 tablet (500 mg total) by mouth every 8 (eight) hours for 2 days.   multivitamin with minerals Tabs tablet Take 1 tablet by mouth  daily.   oxyCODONE-acetaminophen 5-325 MG tablet Commonly known as: Percocet Take 1 tablet by mouth every 4 (four) hours as needed for moderate pain.   pantoprazole 40 MG tablet Commonly known as: PROTONIX TAKE 1 TABLET BY MOUTH EVERY DAY BEFORE BREAKFAST   potassium chloride SA 20 MEQ tablet Commonly known as: K-DUR Take 2 tablets (40 mEq total) by mouth daily.   Refresh 1.4-0.6 % Soln Generic drug: Polyvinyl Alcohol-Povidone PF Place 1 drop into both eyes as needed (for dry eyes).   rizatriptan 10 MG disintegrating tablet Commonly known as: MAXALT-MLT Take 10 mg by mouth every 2 (two) hours as needed for migraine.   scopolamine 1 MG/3DAYS Commonly known as: TRANSDERM-SCOP Place 1 patch (1.5 mg total) onto the skin every 3 (three) days.   senna-docusate 8.6-50 MG tablet Commonly known as: Senokot-S Take 2 tablets by mouth at bedtime as needed for mild constipation or moderate constipation.   spironolactone 25 MG tablet Commonly known as: ALDACTONE TAKE 1 TABLET BY MOUTH EVERY DAY   TAXOL IV Inject into the vein once a week.   temazepam 15 MG capsule Commonly known as:  RESTORIL Take 1 capsule (15 mg total) by mouth at bedtime as needed for sleep.       Procedures/Studies: Ct Abdomen Pelvis W Contrast  Result Date: 01/28/2019 CLINICAL DATA:  History of abdominal pain and metastatic disease to the liver EXAM: CT ABDOMEN AND PELVIS WITH CONTRAST TECHNIQUE: Multidetector CT imaging of the abdomen and pelvis was performed using the standard protocol following bolus administration of intravenous contrast. CONTRAST:  164mL OMNIPAQUE IOHEXOL 300 MG/ML  SOLN COMPARISON:  12/29/2018 FINDINGS: Lower chest: Small left pleural effusion is noted slightly decreased when compare with the prior exam. Hepatobiliary: Stable metastatic lesion is noted within the liver. The gallbladder is decompressed. Plastic biliary stent is noted in place stable from the prior exam. No new focal hepatic lesion is noted. Pancreas: Within normal limits. Spleen: Normal in size without focal abnormality. Adrenals/Urinary Tract: Adrenal glands are unremarkable. Kidneys demonstrate a normal enhancement pattern bilaterally. Normal excretion of contrast is noted on delayed images. No renal calculi or obstructive changes are seen. The bladder is decompressed. Stomach/Bowel: The sigmoid and descending colon demonstrates significant wall thickening consistent with colitis. This begins at the level of the splenic flexure and extends through the sigmoid. No perforation is noted. Significant pericolonic inflammatory changes are noted. The more proximal colon appears within normal limits. The appendix is unremarkable. No small bowel abnormality is seen. The stomach is within normal limits. Vascular/Lymphatic: No significant vascular findings are present. No enlarged abdominal or pelvic lymph nodes. Reproductive: Status post hysterectomy. No adnexal masses. Other: Mild ascites is noted no definitive peritoneal disease is noted. Musculoskeletal: No acute or significant osseous findings. IMPRESSION: Changes consistent with  colitis in the descending and sigmoid colons. No perforation is noted. Left-sided pleural effusion slightly decreased in the interval from the prior exam. Stable hepatic metastatic disease.  Biliary stent is noted in place. Electronically Signed   By: Inez Catalina M.D.   On: 01/28/2019 10:38   Dg Chest Portable 1 View  Result Date: 01/28/2019 CLINICAL DATA:  Nausea and vomiting EXAM: PORTABLE CHEST 1 VIEW COMPARISON:  12/23/2018 FINDINGS: Cardiac shadow is within normal limits. The lungs are well aerated bilaterally with the exception of a small left-sided pleural effusion. This is stable from the prior exam. Left chest wall port is again seen and stable. No bony abnormality is seen. IMPRESSION: Stable left pleural effusion.  No acute abnormality noted. Electronically Signed   By: Inez Catalina M.D.   On: 01/28/2019 10:07   Dg Abd Portable 1 View  Result Date: 01/28/2019 CLINICAL DATA:  Abdominal pain. The patient is currently on chemotherapy. EXAM: PORTABLE ABDOMEN - 1 VIEW COMPARISON:  CT dated December 29, 2003 FINDINGS: The bowel gas pattern is nonspecific and nonobstructive. There is a biliary stent in place. No definite pneumatosis or free air. IMPRESSION: Nonobstructive bowel gas pattern. Electronically Signed   By: Constance Holster M.D.   On: 01/28/2019 15:36      Subjective: Pt sitting up in chair, says she is feeling better, ate breakfast.  Wants to go home.   Discharge Exam: Vitals:   02/02/19 0805 02/02/19 0900  BP:  109/90  Pulse: (!) 124 (!) 102  Resp: 18   Temp: 98.5 F (36.9 C)   SpO2: 95% 96%   Vitals:   02/02/19 0700 02/02/19 0800 02/02/19 0805 02/02/19 0900  BP: (!) 81/55 (!) 90/57  109/90  Pulse: 92 93 (!) 124 (!) 102  Resp: 15 15 18    Temp:   98.5 F (36.9 C)   TempSrc:   Oral   SpO2: 98% 97% 95% 96%  Weight:      Height:       General exam: Chronically ill-appearing female lying in the bed she is awake and alert in no apparent distress. Respiratory system:  Clear. No increased work of breathing. Cardiovascular system: S1 & S2 heard.  Improving tachycardia.  No JVD, murmurs, gallops, clicks or pedal edema. Gastrointestinal system: Abdomen is nondistended, soft and nontender. Normal bowel sounds heard. Central nervous system: Alert and oriented. No focal neurological deficits. Extremities: no CCE.  SCDs.  The results of significant diagnostics from this hospitalization (including imaging, microbiology, ancillary and laboratory) are listed below for reference.     Microbiology: Recent Results (from the past 240 hour(s))  Culture, blood (routine x 2)     Status: None   Collection Time: 01/27/19 11:57 PM   Specimen: BLOOD RIGHT HAND  Result Value Ref Range Status   Specimen Description BLOOD RIGHT HAND  Final   Special Requests   Final    Blood Culture adequate volume BOTTLES DRAWN AEROBIC AND ANAEROBIC   Culture   Final    NO GROWTH 5 DAYS Performed at [facility], [address], [place], [place] [address]    Report Status 02/02/2019 FINAL  Final  Culture, blood (routine x 2)     Status: None   Collection Time: 01/28/19 12:11 AM   Specimen: BLOOD RIGHT WRIST  Result Value Ref Range Status   Specimen Description BLOOD RIGHT WRIST  Final   Special Requests   Final    BOTTLES DRAWN AEROBIC AND ANAEROBIC Blood Culture adequate volume   Culture   Final    NO GROWTH 5 DAYS Performed at [facility], [address] [place], [place] [address]    Report Status 02/02/2019 FINAL  Final  Gastrointestinal Panel by PCR , Stool     Status: None   Collection Time: 01/28/19  1:09 AM   Specimen: Stool  Result Value Ref Range Status   Campylobacter species NOT DETECTED NOT DETECTED Final   Plesimonas shigelloides NOT DETECTED NOT DETECTED Final   Salmonella species NOT DETECTED NOT DETECTED Final   Yersinia enterocolitica NOT DETECTED NOT DETECTED Final   Vibrio species NOT DETECTED NOT DETECTED Final   Vibrio cholerae NOT DETECTED NOT DETECTED  Final   Enteroaggregative  E coli (EAEC) NOT DETECTED NOT DETECTED Final   Enteropathogenic E coli (EPEC) NOT DETECTED NOT DETECTED Final   Enterotoxigenic E coli (ETEC) NOT DETECTED NOT DETECTED Final   Shiga like toxin producing E coli (STEC) NOT DETECTED NOT DETECTED Final   Shigella/Enteroinvasive E coli (EIEC) NOT DETECTED NOT DETECTED Final   Cryptosporidium NOT DETECTED NOT DETECTED Final   Cyclospora cayetanensis NOT DETECTED NOT DETECTED Final   Entamoeba histolytica NOT DETECTED NOT DETECTED Final   Giardia lamblia NOT DETECTED NOT DETECTED Final   Adenovirus F40/41 NOT DETECTED NOT DETECTED Final   Astrovirus NOT DETECTED NOT DETECTED Final   Norovirus GI/GII NOT DETECTED NOT DETECTED Final   Rotavirus A NOT DETECTED NOT DETECTED Final   Sapovirus (I, II, IV, and V) NOT DETECTED NOT DETECTED Final    Comment: Performed at [facility], [address] [place], [place] [address]  SARS Coronavirus 2 [facility] order, Performed in [facility] hospital lab) Nasopharyngeal STOOL     Status: None   Collection Time: 01/28/19  5:16 AM   Specimen: STOOL; Nasopharyngeal  Result Value Ref Range Status   SARS Coronavirus 2 NEGATIVE NEGATIVE Final    Comment: (NOTE) If result is NEGATIVE SARS-CoV-2 target nucleic acids are NOT DETECTED. The SARS-CoV-2 RNA is generally detectable in upper and lower  respiratory specimens during the acute phase of infection. The lowest  concentration of SARS-CoV-2 viral copies this assay can detect is 250  copies / mL. A negative result does not preclude SARS-CoV-2 infection  and should not be used as the sole basis for treatment or other  patient management decisions.  A negative result may occur with  improper specimen collection / handling, submission of specimen other  than nasopharyngeal swab, presence of viral mutation(s) within the  areas targeted by this assay, and inadequate number of viral copies  (<250 copies / mL). A negative result  must be combined with clinical  observations, patient history, and epidemiological information. If result is POSITIVE SARS-CoV-2 target nucleic acids are DETECTED. The SARS-CoV-2 RNA is generally detectable in upper and lower  respiratory specimens dur ing the acute phase of infection.  Positive  results are indicative of active infection with SARS-CoV-2.  Clinical  correlation with patient history and other diagnostic information is  necessary to determine patient infection status.  Positive results do  not rule out bacterial infection or co-infection with other viruses. If result is PRESUMPTIVE POSTIVE SARS-CoV-2 nucleic acids MAY BE PRESENT.   A presumptive positive result was obtained on the submitted specimen  and confirmed on repeat testing.  While 2019 novel coronavirus  (SARS-CoV-2) nucleic acids may be present in the submitted sample  additional confirmatory testing may be necessary for epidemiological  and / or clinical management purposes  to differentiate between  SARS-CoV-2 and other Sarbecovirus currently known to infect humans.  If clinically indicated additional testing with an alternate test  methodology [phone]) is advised. The SARS-CoV-2 RNA is generally  detectable in upper and lower respiratory sp ecimens during the acute  phase of infection. The expected result is Negative. Fact Sheet for Patients:  StrictlyIdeas.no Fact Sheet for Healthcare Providers: BankingDealers.co.za This test is not yet approved or cleared by the [place] FDA and has been authorized for detection and/or diagnosis of SARS-CoV-2 by FDA under an Emergency Use Authorization (EUA).  This EUA will remain in effect (meaning this test can be used) for the duration of the COVID-19 declaration under Section 564(b)(1) of the Act,  21 U.S.C. section 360bbb-3(b)(1), unless the authorization is terminated or revoked sooner. Performed at [facility], [address], [place], [place] [address]   C difficile quick scan w PCR reflex     Status: None   Collection Time: 01/28/19  5:23 AM   Specimen: STOOL  Result Value Ref Range Status   C Diff antigen NEGATIVE NEGATIVE Final   C Diff toxin NEGATIVE NEGATIVE Final   C Diff interpretation No C. difficile detected.  Final    Comment: Performed at [facility], [address] [place], [place] [address]  MRSA PCR Screening     Status: None   Collection Time: 01/28/19 12:39 PM   Specimen: Nasal Mucosa; Nasopharyngeal  Result Value Ref Range Status   MRSA by PCR NEGATIVE NEGATIVE Final    Comment:        The GeneXpert MRSA Assay (FDA approved for NASAL specimens only), is one component of a comprehensive MRSA colonization surveillance program. It is not intended to diagnose MRSA infection nor to guide or monitor treatment for MRSA infections. Performed at [facility], [address] [place], [place] [address]      Labs: BNP (last 3 results) Recent Labs    12/13/18 1115  BNP [id number]   Basic Metabolic Panel: Recent Labs  Lab 01/27/19 2330 01/29/19 0449 01/30/19 0515 01/31/19 0503 02/01/19 0414  NA 133* 133* 133* 134* 134*  K 3.9 2.9* 3.1* 3.4* 3.5  CL 97* 103 104 104 105  CO2 25 24 23 25 23   GLUCOSE 146* 87 78 75 99  BUN 17 9 7 6 6   CREATININE 0.87 0.51 0.55 0.63 0.63  CALCIUM 9.1 7.7* 7.6* 7.8* 8.0*  MG 1.6* 1.5* 1.7 1.7 1.7   Liver Function Tests: Recent Labs  Lab 01/27/19 2330 01/29/19 0449 01/30/19 0515 01/31/19 0503  AST 45* 25 24 31   ALT 25 19 17 16   ALKPHOS 125 78 85 95  BILITOT 2.3* 1.3* 1.1 1.1  PROT 7.1 4.7* 4.7* 4.8*  ALBUMIN 3.1* 2.2* 2.1* 2.1*   Recent Labs  Lab 01/27/19 2330  LIPASE 18   No results for input(s): AMMONIA in the last 168 hours. CBC: Recent Labs  Lab 01/29/19 0449 01/29/19 0628 01/30/19 0515 01/31/19 0503 02/01/19 0414  WBC 0.6* 0.8* 1.2* 5.6 17.5*  NEUTROABS 0.1* 0.1* 0.3* 1.9 9.7*  HGB 7.3* 7.2* 7.5* 7.4* 7.9*   HCT 23.0* 22.6* 23.7* 23.8* 24.8*  MCV 80.7 81.3 82.0 82.9 81.3  PLT 87* 89* 105* 146* 169   Cardiac Enzymes: No results for input(s): CKTOTAL, CKMB, CKMBINDEX, TROPONINI in the last 168 hours. BNP: Invalid input(s): POCBNP CBG: No results for input(s): GLUCAP in the last 168 hours. D-Dimer No results for input(s): DDIMER in the last 72 hours. Hgb A1c No results for input(s): HGBA1C in the last 72 hours. Lipid Profile No results for input(s): CHOL, HDL, LDLCALC, TRIG, CHOLHDL, LDLDIRECT in the last 72 hours. Thyroid function studies No results for input(s): TSH, T4TOTAL, T3FREE, THYROIDAB in the last 72 hours.  Invalid input(s): FREET3 Anemia work up No results for input(s): VITAMINB12, FOLATE, FERRITIN, TIBC, IRON, RETICCTPCT in the last 72 hours. Urinalysis    Component Value Date/Time   COLORURINE AMBER (A) 01/27/2019 2340   APPEARANCEUR HAZY (A) 01/27/2019 2340   LABSPEC 1.020 01/27/2019 2340   PHURINE 5.0 01/27/2019 2340   GLUCOSEU NEGATIVE 01/27/2019 2340   HGBUR NEGATIVE 01/27/2019 2340   BILIRUBINUR NEGATIVE 01/27/2019 2340   KETONESUR NEGATIVE 01/27/2019 2340  PROTEINUR 30 (A) 01/27/2019 2340   NITRITE NEGATIVE 01/27/2019 2340   LEUKOCYTESUR NEGATIVE 01/27/2019 2340   Sepsis Labs Invalid input(s): PROCALCITONIN,  WBC,  LACTICIDVEN Microbiology Recent Results (from the past 240 hour(s))  Culture, blood (routine x 2)     Status: None   Collection Time: 01/27/19 11:57 PM   Specimen: BLOOD RIGHT HAND  Result Value Ref Range Status   Specimen Description BLOOD RIGHT HAND  Final   Special Requests   Final    Blood Culture adequate volume BOTTLES DRAWN AEROBIC AND ANAEROBIC   Culture   Final    NO GROWTH 5 DAYS Performed at [facility], [address], [place], [place] [address]    Report Status 02/02/2019 FINAL  Final  Culture, blood (routine x 2)     Status: None   Collection Time: 01/28/19 12:11 AM   Specimen: BLOOD RIGHT WRIST  Result Value Ref Range  Status   Specimen Description BLOOD RIGHT WRIST  Final   Special Requests   Final    BOTTLES DRAWN AEROBIC AND ANAEROBIC Blood Culture adequate volume   Culture   Final    NO GROWTH 5 DAYS Performed at [facility], [address], [place], [place] [address]    Report Status 02/02/2019 FINAL  Final  Gastrointestinal Panel by PCR , Stool     Status: None   Collection Time: 01/28/19  1:09 AM   Specimen: Stool  Result Value Ref Range Status   Campylobacter species NOT DETECTED NOT DETECTED Final   Plesimonas shigelloides NOT DETECTED NOT DETECTED Final   Salmonella species NOT DETECTED NOT DETECTED Final   Yersinia enterocolitica NOT DETECTED NOT DETECTED Final   Vibrio species NOT DETECTED NOT DETECTED Final   Vibrio cholerae NOT DETECTED NOT DETECTED Final   Enteroaggregative E coli (EAEC) NOT DETECTED NOT DETECTED Final   Enteropathogenic E coli (EPEC) NOT DETECTED NOT DETECTED Final   Enterotoxigenic E coli (ETEC) NOT DETECTED NOT DETECTED Final   Shiga like toxin producing E coli (STEC) NOT DETECTED NOT DETECTED Final   Shigella/Enteroinvasive E coli (EIEC) NOT DETECTED NOT DETECTED Final   Cryptosporidium NOT DETECTED NOT DETECTED Final   Cyclospora cayetanensis NOT DETECTED NOT DETECTED Final   Entamoeba histolytica NOT DETECTED NOT DETECTED Final   Giardia lamblia NOT DETECTED NOT DETECTED Final   Adenovirus F40/41 NOT DETECTED NOT DETECTED Final   Astrovirus NOT DETECTED NOT DETECTED Final   Norovirus GI/GII NOT DETECTED NOT DETECTED Final   Rotavirus A NOT DETECTED NOT DETECTED Final   Sapovirus (I, II, IV, and V) NOT DETECTED NOT DETECTED Final    Comment: Performed at [facility], [address] [place], [place] [address]  SARS Coronavirus 2 [facility] order, Performed in [place] hospital lab) Nasopharyngeal STOOL     Status: None   Collection Time: 01/28/19  5:16 AM   Specimen: STOOL; Nasopharyngeal  Result Value Ref Range Status   SARS Coronavirus 2  NEGATIVE NEGATIVE Final    Comment: (NOTE) If result is NEGATIVE SARS-CoV-2 target nucleic acids are NOT DETECTED. The SARS-CoV-2 RNA is generally detectable in upper and lower  respiratory specimens during the acute phase of infection. The lowest  concentration of SARS-CoV-2 viral copies this assay can detect is 250  copies / mL. A negative result does not preclude SARS-CoV-2 infection  and should not be used as the sole basis for treatment or other  patient management decisions.  A negative result may occur with  improper specimen  collection / handling, submission of specimen other  than nasopharyngeal swab, presence of viral mutation(s) within the  areas targeted by this assay, and inadequate number of viral copies  (<250 copies / mL). A negative result must be combined with clinical  observations, patient history, and epidemiological information. If result is POSITIVE SARS-CoV-2 target nucleic acids are DETECTED. The SARS-CoV-2 RNA is generally detectable in upper and lower  respiratory specimens dur ing the acute phase of infection.  Positive  results are indicative of active infection with SARS-CoV-2.  Clinical  correlation with patient history and other diagnostic information is  necessary to determine patient infection status.  Positive results do  not rule out bacterial infection or co-infection with other viruses. If result is PRESUMPTIVE POSTIVE SARS-CoV-2 nucleic acids MAY BE PRESENT.   A presumptive positive result was obtained on the submitted specimen  and confirmed on repeat testing.  While 2019 novel coronavirus  (SARS-CoV-2) nucleic acids may be present in the submitted sample  additional confirmatory testing may be necessary for epidemiological  and / or clinical management purposes  to differentiate between  SARS-CoV-2 and other Sarbecovirus currently known to infect humans.  If clinically indicated additional testing with an alternate test  methodology [phone])  is advised. The SARS-CoV-2 RNA is generally  detectable in upper and lower respiratory sp ecimens during the acute  phase of infection. The expected result is Negative. Fact Sheet for Patients:  StrictlyIdeas.no Fact Sheet for Healthcare Providers: BankingDealers.co.za This test is not yet approved or cleared by the [place] FDA and has been authorized for detection and/or diagnosis of SARS-CoV-2 by FDA under an Emergency Use Authorization (EUA).  This EUA will remain in effect (meaning this test can be used) for the duration of the COVID-19 declaration under Section 564(b)(1) of the Act, 21 U.S.C. section 360bbb-3(b)(1), unless the authorization is terminated or revoked sooner. Performed at [facility], [address] [place], [place] [address]   C difficile quick scan w PCR reflex     Status: None   Collection Time: 01/28/19  5:23 AM   Specimen: STOOL  Result Value Ref Range Status   C Diff antigen NEGATIVE NEGATIVE Final   C Diff toxin NEGATIVE NEGATIVE Final   C Diff interpretation No C. difficile detected.  Final    Comment: Performed at [facility], [address] [place], [place] [address]  MRSA PCR Screening     Status: None   Collection Time: 01/28/19 12:39 PM   Specimen: Nasal Mucosa; Nasopharyngeal  Result Value Ref Range Status   MRSA by PCR NEGATIVE NEGATIVE Final    Comment:        The GeneXpert MRSA Assay (FDA approved for NASAL specimens only), is one component of a comprehensive MRSA colonization surveillance program. It is not intended to diagnose MRSA infection nor to guide or monitor treatment for MRSA infections. Performed at [facility], [address] [place],  [address]    Time coordinating discharge: 35 minutes   SIGNED:  Irwin Brakeman, MD  [facility] 02/02/2019, 10:44 AM How to contact the [facility] Attending or Consulting provider [place] or covering provider during after  hours [place], for this patient?  1. Check the care team in [facility] and look for a) attending/consulting TR[facility] provider listed and b) the [facility] team listed 2. Log into [email or web address] and use [facility] universal password to access. If you do not have the password, please contact the hospital operator. 3. Locate the [facility] provider  you are looking for under [facility] and page to a number that you can be directly reached. 4. If you still have difficulty reaching the provider, please page the [facility] (Director on Call) for the Hospitalists listed on amion for assistance.

## 2019-02-04 ENCOUNTER — Ambulatory Visit: Payer: 59 | Admitting: Gastroenterology

## 2019-02-04 ENCOUNTER — Other Ambulatory Visit: Payer: Self-pay

## 2019-02-04 ENCOUNTER — Inpatient Hospital Stay (HOSPITAL_COMMUNITY): Payer: 59

## 2019-02-04 ENCOUNTER — Encounter (HOSPITAL_COMMUNITY): Payer: Self-pay | Admitting: Hematology

## 2019-02-04 ENCOUNTER — Inpatient Hospital Stay (HOSPITAL_BASED_OUTPATIENT_CLINIC_OR_DEPARTMENT_OTHER): Payer: 59 | Admitting: Hematology

## 2019-02-04 VITALS — BP 99/55 | HR 101 | Temp 97.1°F | Resp 20 | Wt 187.2 lb

## 2019-02-04 VITALS — BP 110/56 | HR 95 | Temp 98.4°F | Resp 20

## 2019-02-04 DIAGNOSIS — Z5111 Encounter for antineoplastic chemotherapy: Secondary | ICD-10-CM | POA: Diagnosis not present

## 2019-02-04 DIAGNOSIS — Z170 Estrogen receptor positive status [ER+]: Secondary | ICD-10-CM | POA: Diagnosis not present

## 2019-02-04 DIAGNOSIS — R110 Nausea: Secondary | ICD-10-CM | POA: Diagnosis not present

## 2019-02-04 DIAGNOSIS — G620 Drug-induced polyneuropathy: Secondary | ICD-10-CM | POA: Diagnosis not present

## 2019-02-04 DIAGNOSIS — C50919 Malignant neoplasm of unspecified site of unspecified female breast: Secondary | ICD-10-CM

## 2019-02-04 DIAGNOSIS — C50112 Malignant neoplasm of central portion of left female breast: Secondary | ICD-10-CM | POA: Diagnosis not present

## 2019-02-04 DIAGNOSIS — R200 Anesthesia of skin: Secondary | ICD-10-CM | POA: Diagnosis not present

## 2019-02-04 DIAGNOSIS — C787 Secondary malignant neoplasm of liver and intrahepatic bile duct: Secondary | ICD-10-CM | POA: Diagnosis not present

## 2019-02-04 DIAGNOSIS — R188 Other ascites: Secondary | ICD-10-CM | POA: Diagnosis not present

## 2019-02-04 DIAGNOSIS — Z79899 Other long term (current) drug therapy: Secondary | ICD-10-CM | POA: Diagnosis not present

## 2019-02-04 DIAGNOSIS — R0602 Shortness of breath: Secondary | ICD-10-CM | POA: Diagnosis not present

## 2019-02-04 DIAGNOSIS — T451X5A Adverse effect of antineoplastic and immunosuppressive drugs, initial encounter: Secondary | ICD-10-CM | POA: Diagnosis not present

## 2019-02-04 DIAGNOSIS — R600 Localized edema: Secondary | ICD-10-CM | POA: Diagnosis not present

## 2019-02-04 DIAGNOSIS — K5900 Constipation, unspecified: Secondary | ICD-10-CM | POA: Diagnosis not present

## 2019-02-04 DIAGNOSIS — D701 Agranulocytosis secondary to cancer chemotherapy: Secondary | ICD-10-CM | POA: Diagnosis not present

## 2019-02-04 LAB — CBC WITH DIFFERENTIAL/PLATELET
Abs Immature Granulocytes: 0.7 10*3/uL — ABNORMAL HIGH (ref 0.00–0.07)
Basophils Absolute: 0.1 10*3/uL (ref 0.0–0.1)
Basophils Relative: 1 %
Eosinophils Absolute: 0 10*3/uL (ref 0.0–0.5)
Eosinophils Relative: 0 %
HCT: 28.4 % — ABNORMAL LOW (ref 36.0–46.0)
Hemoglobin: 8.8 g/dL — ABNORMAL LOW (ref 12.0–15.0)
Immature Granulocytes: 8 %
Lymphocytes Relative: 11 %
Lymphs Abs: 1 10*3/uL (ref 0.7–4.0)
MCH: 26 pg (ref 26.0–34.0)
MCHC: 31 g/dL (ref 30.0–36.0)
MCV: 83.8 fL (ref 80.0–100.0)
Monocytes Absolute: 1 10*3/uL (ref 0.1–1.0)
Monocytes Relative: 11 %
Neutro Abs: 6.5 10*3/uL (ref 1.7–7.7)
Neutrophils Relative %: 69 %
Platelets: 135 10*3/uL — ABNORMAL LOW (ref 150–400)
RBC: 3.39 MIL/uL — ABNORMAL LOW (ref 3.87–5.11)
RDW: 21.6 % — ABNORMAL HIGH (ref 11.5–15.5)
WBC: 9.3 10*3/uL (ref 4.0–10.5)
nRBC: 0.4 % — ABNORMAL HIGH (ref 0.0–0.2)

## 2019-02-04 LAB — COMPREHENSIVE METABOLIC PANEL
ALT: 14 U/L (ref 0–44)
AST: 39 U/L (ref 15–41)
Albumin: 2.5 g/dL — ABNORMAL LOW (ref 3.5–5.0)
Alkaline Phosphatase: 138 U/L — ABNORMAL HIGH (ref 38–126)
Anion gap: 7 (ref 5–15)
BUN: 7 mg/dL (ref 6–20)
CO2: 25 mmol/L (ref 22–32)
Calcium: 8.4 mg/dL — ABNORMAL LOW (ref 8.9–10.3)
Chloride: 104 mmol/L (ref 98–111)
Creatinine, Ser: 0.59 mg/dL (ref 0.44–1.00)
GFR calc Af Amer: >60 mL/min (ref >60)
GFR calc non Af Amer: >60 mL/min (ref >60)
Glucose, Bld: 121 mg/dL — ABNORMAL HIGH (ref 70–99)
Potassium: 3.7 mmol/L (ref 3.5–5.1)
Sodium: 136 mmol/L (ref 135–145)
Total Bilirubin: 0.8 mg/dL (ref 0.3–1.2)
Total Protein: 5.4 g/dL — ABNORMAL LOW (ref 6.5–8.1)

## 2019-02-04 MED ORDER — HEPARIN SOD (PORK) LOCK FLUSH 100 UNIT/ML IV SOLN
500.0000 [IU] | Freq: Once | INTRAVENOUS | Status: AC | PRN
  Administered 2019-02-04: 12:00:00 500 [IU]

## 2019-02-04 MED ORDER — SODIUM CHLORIDE 0.9 % IV SOLN
40.0000 mg/m2 | Freq: Once | INTRAVENOUS | Status: AC
  Administered 2019-02-04: 78 mg via INTRAVENOUS
  Filled 2019-02-04: qty 13

## 2019-02-04 MED ORDER — SODIUM CHLORIDE 0.9 % IV SOLN
Freq: Once | INTRAVENOUS | Status: AC
  Administered 2019-02-04: 10:00:00 via INTRAVENOUS

## 2019-02-04 MED ORDER — SODIUM CHLORIDE 0.9 % IV SOLN
10.0000 mg | Freq: Once | INTRAVENOUS | Status: AC
  Administered 2019-02-04: 10:00:00 10 mg via INTRAVENOUS
  Filled 2019-02-04: qty 10

## 2019-02-04 MED ORDER — SODIUM CHLORIDE 0.9% FLUSH
10.0000 mL | INTRAVENOUS | Status: DC | PRN
  Administered 2019-02-04: 10 mL
  Filled 2019-02-04: qty 10

## 2019-02-04 MED ORDER — FAMOTIDINE IN NACL 20-0.9 MG/50ML-% IV SOLN
20.0000 mg | Freq: Once | INTRAVENOUS | Status: AC
  Administered 2019-02-04: 20 mg via INTRAVENOUS
  Filled 2019-02-04: qty 50

## 2019-02-04 MED ORDER — PALONOSETRON HCL INJECTION 0.25 MG/5ML
0.2500 mg | Freq: Once | INTRAVENOUS | Status: AC
  Administered 2019-02-04: 10:00:00 0.25 mg via INTRAVENOUS
  Filled 2019-02-04: qty 5

## 2019-02-04 MED ORDER — DIPHENHYDRAMINE HCL 50 MG/ML IJ SOLN
25.0000 mg | Freq: Once | INTRAMUSCULAR | Status: AC
  Administered 2019-02-04: 25 mg via INTRAVENOUS
  Filled 2019-02-04: qty 1

## 2019-02-04 NOTE — Progress Notes (Addendum)
Pamela Long, [place] [address]   CLINIC:  Medical Oncology/Hematology  PCP:  Doree Albee, MD [place] [address] [phone]   REASON FOR VISIT:  Follow-up for metastatic breast cancer.   BRIEF ONCOLOGIC HISTORY:  Oncology History  Metastatic breast cancer ([place])  07/02/2018 Initial Diagnosis   Metastatic breast cancer ([place])   09/18/2018 -  Chemotherapy   The patient had palonosetron (ALOXI) injection 0.25 mg, 0.25 mg, Intravenous,  Once, 4 of 4 cycles Administration: 0.25 mg (10/01/2018), 0.25 mg (10/09/2018), 0.25 mg (10/23/2018), 0.25 mg (10/30/2018), 0.25 mg (11/12/2018), 0.25 mg (12/03/2018), 0.25 mg (12/24/2018), 0.25 mg (01/22/2019), 0.25 mg (01/07/2019) PACLitaxel (TAXOL) 120 mg in sodium chloride 0.9 % 250 mL chemo infusion (</= 80mg /m2), 60 mg/m2 = 120 mg (100 % of original dose 60 mg/m2), Intravenous,  Once, 4 of 4 cycles Dose modification: 60 mg/m2 (original dose 60 mg/m2, Cycle 1, Reason: Change in LFTs), 80 mg/m2 (original dose 60 mg/m2, Cycle 2, Reason: Provider Judgment), 54 mg/m2 (90 % of original dose 60 mg/m2, Cycle 3, Reason: Other (see comments), Comment: hepatic dysfunction) Administration: 120 mg (09/18/2018), 120 mg (10/01/2018), 120 mg (10/23/2018), 120 mg (10/09/2018), 120 mg (10/30/2018), 162 mg (11/12/2018), 162 mg (12/03/2018), 108 mg (12/24/2018), 108 mg (01/22/2019), 108 mg (01/07/2019)  for chemotherapy treatment.       CANCER STAGING: Cancer Staging No matching staging information was found for the patient.   INTERVAL HISTORY:  Pamela Long 48 y.o. female seen for follow-up of metastatic breast cancer to the liver.  Appetite is 50%.  Energy levels are low.  She was recently hospitalized and was treated with colitis.  Numbness in the feet has been stable and the gabapentin is helping.  Occasional diarrhea present.  Sleep problems are stable.  Shortness of breath on exertion is also stable.  Denies any fevers or  chills.    REVIEW OF SYSTEMS:  Review of Systems  Respiratory: Positive for cough and shortness of breath.   Gastrointestinal: Positive for diarrhea.  Neurological: Positive for numbness.  All other systems reviewed and are negative.    PAST MEDICAL/SURGICAL HISTORY:  Past Medical History:  Diagnosis Date  . Anemia   . Anxiety   . Asthma   . Breast cancer ([place])    Left, 2017  . Complication of anesthesia   . Depression   . GERD (gastroesophageal reflux disease)   . Migraine   . OAB (overactive bladder)   . PONV (postoperative nausea and vomiting)   . Seasonal allergies    Past Surgical History:  Procedure Laterality Date  . ABDOMINAL HYSTERECTOMY     breast cancer  . ANKLE RECONSTRUCTION Right 1989  . BILIARY STENT PLACEMENT N/A 09/22/2018   Procedure: BILIARY STENT PLACEMENT;  Surgeon: Rogene Houston, MD;  [facility];  Service: Endoscopy;  Laterality: N/A;  . BREAST SURGERY    . COLONOSCOPY WITH PROPOFOL N/A 08/06/2018   Procedure: COLONOSCOPY WITH PROPOFOL;  Surgeon: Danie Binder, MD;  Location: [facility];  Service: Endoscopy;  Laterality: N/A;  1:30pm  . ERCP N/A 09/22/2018   Procedure: ENDOSCOPIC RETROGRADE CHOLANGIOPANCREATOGRAPHY (ERCP);  Surgeon: Rogene Houston, MD;  Location: [facility];  Service: Endoscopy;  Laterality: N/A;  . FOREIGN BODY REMOVAL N/A 08/29/2017   from lip  . KNEE SURGERY Right 1989   bone spur  . MASTECTOMY  2017   bil mastectomies  . PORTACATH PLACEMENT Left 12/23/2018  Procedure: INSERTION PORT-A-CATH LEFT SUBCLAVIAN;  Surgeon: Aviva Signs, MD;  Location: [facility] ORS;  Service: General;  Laterality: Left;  . SINUSOTOMY       SOCIAL HISTORY:  Social History   Socioeconomic History  . Marital status: Single    Spouse name: Not on file  . Number of children: 1  . Years of education: 53  . Highest education level: Not on file  Occupational History  . Occupation: disabled  Social Needs  . Financial resource  strain: Somewhat hard  . Food insecurity    Worry: Sometimes true    Inability: Sometimes true  . Transportation needs    Medical: No    Non-medical: No  Tobacco Use  . Smoking status: Never Smoker  . Smokeless tobacco: Never Used  Substance and Sexual Activity  . Alcohol use: No  . Drug use: No  . Sexual activity: Not Currently  Lifestyle  . Physical activity    Days per week: 0 days    Minutes per session: 0 min  . Stress: Rather much  Relationships  . Social Herbalist on phone: Once a week    Gets together: Once a week    Attends religious service: 1 to 4 times per year    Active member of club or organization: Yes    Attends meetings of clubs or organizations: Never    Relationship status: Never married  . Intimate partner violence    Fear of current or ex partner: No    Emotionally abused: No    Physically abused: No    Forced sexual activity: No  Other Topics Concern  . Not on file  Social History Narrative   Bachelors degree   Accounting   Lives with daughter [name] who has autism and diabetes   Likes to sew, quilt, crafts, crochet    FAMILY HISTORY:  Family History  Problem Relation Age of Onset  . Arthritis Mother   . COPD Mother   . Depression Mother   . Diabetes Mother   . Kidney disease Father   . Heart disease Father 5  . Drug abuse Father   . Hypertension Father   . Diabetes Daughter   . Hashimoto's thyroiditis Daughter   . Irritable bowel syndrome Daughter   . Autism spectrum disorder Daughter   . Early death Maternal Grandmother        drowned  . Early death Maternal Grandfather   . Heart disease Maternal Grandfather   . Heart disease Paternal Grandfather   . Breast cancer Maternal Aunt   . Breast cancer Cousin   . Lung cancer Maternal Aunt   . Lung cancer Maternal Uncle   . Colon cancer Neg Hx     CURRENT MEDICATIONS:  Outpatient Encounter Medications as of 02/04/2019  Medication Sig Note  . acetaminophen (TYLENOL) 325  MG tablet Take 2 tablets (650 mg total) by mouth every 6 (six) hours as needed for mild pain, fever or headache (T over 101).   . ciprofloxacin (CIPRO) 500 MG tablet Take 1 tablet (500 mg total) by mouth 2 (two) times daily for 2 days.   Marland Kitchen escitalopram (LEXAPRO) 20 MG tablet Take 1 tablet (20 mg total) by mouth daily.   . feeding supplement, ENSURE ENLIVE, (ENSURE ENLIVE) LIQD Take 237 mLs by mouth 2 (two) times daily between meals.   . gabapentin (NEURONTIN) 300 MG capsule Take 1 capsule (300 mg total) by mouth at bedtime.   . lidocaine-prilocaine (EMLA) cream  Apply 1 application topically as needed.   . magic mouthwash w/lidocaine SOLN Take 5 mLs by mouth 4 (four) times daily as needed for mouth pain. 01/28/2019: Takes when needed  . magnesium oxide (MAG-OX) 400 MG tablet TAKE 1 TABLET BY MOUTH EVERY DAY   . metroNIDAZOLE (FLAGYL) 500 MG tablet Take 1 tablet (500 mg total) by mouth every 8 (eight) hours for 2 days.   . Multiple Vitamin (MULTIVITAMIN WITH MINERALS) TABS tablet Take 1 tablet by mouth daily.   Marland Kitchen oxyCODONE-acetaminophen (PERCOCET) 5-325 MG tablet Take 1 tablet by mouth every 4 (four) hours as needed for moderate pain. 01/28/2019: Only takes when needed  . PACLitaxel (TAXOL IV) Inject into the vein once a week.    . pantoprazole (PROTONIX) 40 MG tablet TAKE 1 TABLET BY MOUTH EVERY DAY BEFORE BREAKFAST   . Polyvinyl Alcohol-Povidone PF (REFRESH) 1.4-0.6 % SOLN Place 1 drop into both eyes as needed (for dry eyes).    . potassium chloride SA (K-DUR) 20 MEQ tablet Take 2 tablets (40 mEq total) by mouth daily.   . rizatriptan (MAXALT-MLT) 10 MG disintegrating tablet Take 10 mg by mouth every 2 (two) hours as needed for migraine.  01/28/2019: As needed  . scopolamine (TRANSDERM-SCOP) 1 MG/3DAYS Place 1 patch (1.5 mg total) onto the skin every 3 (three) days.   Marland Kitchen senna-docusate (SENOKOT-S) 8.6-50 MG tablet Take 2 tablets by mouth at bedtime as needed for mild constipation or moderate  constipation.   Marland Kitchen spironolactone (ALDACTONE) 25 MG tablet TAKE 1 TABLET BY MOUTH EVERY DAY   . temazepam (RESTORIL) 15 MG capsule Take 1 capsule (15 mg total) by mouth at bedtime as needed for sleep.    No facility-administered encounter medications on file as of 02/04/2019.     ALLERGIES:  Allergies  Allergen Reactions  . Salagen [Pilocarpine] Other (See Comments)    Can cause liver failure   . Tape Itching and Other (See Comments)    Depending on the adhesive-blistering occurs  . Amoxicillin Hives and Other (See Comments)    Rash only DID THE REACTION INVOLVE: Swelling of the face/tongue/throat, SOB, or low BP? Sudden or severe rash/hives, skin peeling, or the inside of the mouth or nose?  Did it require medical treatment?  When did it last happen? If all above answers are "NO", may proceed with cephalosporin use.   . Caffeine Diarrhea, Nausea Only, Palpitations and Other (See Comments)    Headache  . Tetanus Toxoids Swelling and Other (See Comments)    Local reaction     PHYSICAL EXAM:  ECOG Performance status: 1 Blood pressure is 126/53.  Pulse rate is 110.  Respirate is 18.  Temperature 98.6.  Saturations are 96%. Physical Exam Vitals signs reviewed.  Constitutional:      Appearance: Normal appearance.  Cardiovascular:     Rate and Rhythm: Normal rate and regular rhythm.     Heart sounds: Normal heart sounds.  Pulmonary:     Effort: Pulmonary effort is normal.     Breath sounds: Normal breath sounds.  Abdominal:     General: There is no distension.     Palpations: Abdomen is soft. There is no mass.  Musculoskeletal:        General: No swelling.  Skin:    General: Skin is warm.  Neurological:     General: No focal deficit present.     Mental Status: She is alert and oriented to person, place, and time.  Psychiatric:  Mood and Affect: Mood normal.        Behavior: Behavior normal.      LABORATORY DATA:  I have reviewed the labs as listed.   CBC    Component Value Date/Time   WBC 17.5 (H) 02/01/2019 0414   RBC 3.05 (L) 02/01/2019 0414   HGB 7.9 (L) 02/01/2019 0414   HCT 24.8 (L) 02/01/2019 0414   PLT 169 02/01/2019 0414   MCV 81.3 02/01/2019 0414   MCH 25.9 (L) 02/01/2019 0414   MCHC 31.9 02/01/2019 0414   RDW 19.4 (H) 02/01/2019 0414   LYMPHSABS 1.6 02/01/2019 0414   MONOABS 3.6 (H) 02/01/2019 0414   EOSABS 0.0 02/01/2019 0414   BASOSABS 0.1 02/01/2019 0414   CMP Latest Ref Rng & Units 02/01/2019 01/31/2019 01/30/2019  Glucose 70 - 99 mg/dL 99 75 78  BUN 6 - 20 mg/dL 6 6 7   Creatinine 0.44 - 1.00 mg/dL 0.63 0.63 0.55  Sodium 135 - 145 mmol/L 134(L) 134(L) 133(L)  Potassium 3.5 - 5.1 mmol/L 3.5 3.4(L) 3.1(L)  Chloride 98 - 111 mmol/L 105 104 104  CO2 22 - 32 mmol/L 23 25 23   Calcium 8.9 - 10.3 mg/dL 8.0(L) 7.8(L) 7.6(L)  Total Protein 6.5 - 8.1 g/dL - 4.8(L) 4.7(L)  Total Bilirubin 0.3 - 1.2 mg/dL - 1.1 1.1  Alkaline Phos 38 - 126 U/L - 95 85  AST 15 - 41 U/L - 31 24  ALT 0 - 44 U/L - 16 17       DIAGNOSTIC IMAGING:  I have independently reviewed the scans and discussed with the patient.   I have reviewed Venita Lick LPN's note and agree with the documentation.  I personally performed a face-to-face visit, made revisions and my assessment and plan is as follows.    ASSESSMENT & PLAN:  #1 metastatic breast cancer to the liver: - Paclitaxel started on 09/18/2018 at a weekly dose.  She could not tolerate Xeloda added to the regimen. -She was subsequently switched to paclitaxel every other week. - We have reviewed her labs.  She will proceed with paclitaxel with a further dose reduction to 40 mg per metered square.  2.  Ascites: - Abdominal paracentesis on 12/24/2018, 4 L of fluid removed. -She will continue Lasix 20 mg daily and spironolactone 25 mg daily.  3.  Hyperkalemia: - She will continue potassium 20 mEq daily.  4.  Constipation: -She will continue stool softener and MiraLAX.  Total time  spent is 25 minutes with more than 50% of the time spent face-to-face discussing scan results, further plan and counseling and coordination of care.  Orders placed this encounter:  No orders of the defined types were placed in this encounter.     Derek Jack, MD [place] [phone]

## 2019-02-04 NOTE — Progress Notes (Signed)
Labs reviewed by Dr. [name] with oncology appointment today.  Taxol to be dose reduced for treatment.  Ok to treat today verbal order Dr. [name].   Patient tolerated chemotherapy with no complaints voiced.  Port site clean and dry with no bruising or swelling noted at site.  Good blood return noted before and after administration of chemotherapy.  Band aid applied.  Patient left ambulatory with VSS and no s/s of distress noted.

## 2019-02-04 NOTE — Patient Instructions (Addendum)
[facility] Discharge Instructions  You were seen today by Dr. Katragadda. He went over your recent lab results. He will see you back in 1 week for labs and follow up.   Thank you for choosing [facility] at [facility] to provide your oncology and hematology care.  To afford each patient quality time with our provider, please arrive at least 15 minutes before your scheduled appointment time.   If you have a lab appointment with the Cancer Center please come in thru the  Main Entrance and check in at the main information desk  You need to re-schedule your appointment should you arrive 10 or more minutes late.  We strive to give you quality time with our providers, and arriving late affects you and other patients whose appointments are after yours.  Also, if you no show three or more times for appointments you may be dismissed from the clinic at the providers discretion.     Again, thank you for choosing [facility].  Our hope is that these requests will decrease the amount of time that you wait before being seen by our physicians.       _____________________________________________________________  Should you have questions after your visit to [facility], please contact our office at [phone] between the hours of 8:00 a.m. and 4:30 p.m.  Voicemails left after 4:00 p.m. will not be returned until the following business day.  For prescription refill requests, have your pharmacy contact our office and allow 72 hours.    Cancer Center Support Programs:   > Cancer Support Group  2nd Tuesday of the month 1pm-2pm, Journey Room    

## 2019-02-04 NOTE — Progress Notes (Signed)
PRN Fentanyl 13mcg was given to patient on 8/21 @ 0824. Waste witness of 71mcg was witnessed by [employer], Therapist, sports.

## 2019-02-11 ENCOUNTER — Inpatient Hospital Stay (HOSPITAL_COMMUNITY): Payer: 59

## 2019-02-11 ENCOUNTER — Inpatient Hospital Stay (HOSPITAL_COMMUNITY): Payer: 59 | Attending: Hematology | Admitting: Hematology

## 2019-02-11 ENCOUNTER — Encounter (HOSPITAL_COMMUNITY): Payer: Self-pay | Admitting: Hematology

## 2019-02-11 ENCOUNTER — Ambulatory Visit (HOSPITAL_COMMUNITY): Payer: 59

## 2019-02-11 ENCOUNTER — Other Ambulatory Visit (HOSPITAL_COMMUNITY): Payer: 59

## 2019-02-11 ENCOUNTER — Other Ambulatory Visit: Payer: Self-pay

## 2019-02-11 VITALS — BP 98/57 | HR 112 | Temp 97.3°F | Resp 14 | Wt 179.9 lb

## 2019-02-11 DIAGNOSIS — K219 Gastro-esophageal reflux disease without esophagitis: Secondary | ICD-10-CM | POA: Insufficient documentation

## 2019-02-11 DIAGNOSIS — C787 Secondary malignant neoplasm of liver and intrahepatic bile duct: Secondary | ICD-10-CM | POA: Insufficient documentation

## 2019-02-11 DIAGNOSIS — C50919 Malignant neoplasm of unspecified site of unspecified female breast: Secondary | ICD-10-CM | POA: Diagnosis not present

## 2019-02-11 DIAGNOSIS — Z5111 Encounter for antineoplastic chemotherapy: Secondary | ICD-10-CM | POA: Insufficient documentation

## 2019-02-11 DIAGNOSIS — Z170 Estrogen receptor positive status [ER+]: Secondary | ICD-10-CM

## 2019-02-11 DIAGNOSIS — R600 Localized edema: Secondary | ICD-10-CM | POA: Diagnosis not present

## 2019-02-11 DIAGNOSIS — R110 Nausea: Secondary | ICD-10-CM | POA: Diagnosis not present

## 2019-02-11 DIAGNOSIS — R0602 Shortness of breath: Secondary | ICD-10-CM | POA: Diagnosis not present

## 2019-02-11 DIAGNOSIS — Z79899 Other long term (current) drug therapy: Secondary | ICD-10-CM | POA: Insufficient documentation

## 2019-02-11 DIAGNOSIS — E876 Hypokalemia: Secondary | ICD-10-CM | POA: Insufficient documentation

## 2019-02-11 DIAGNOSIS — Z9013 Acquired absence of bilateral breasts and nipples: Secondary | ICD-10-CM | POA: Insufficient documentation

## 2019-02-11 DIAGNOSIS — R42 Dizziness and giddiness: Secondary | ICD-10-CM | POA: Insufficient documentation

## 2019-02-11 DIAGNOSIS — R180 Malignant ascites: Secondary | ICD-10-CM | POA: Insufficient documentation

## 2019-02-11 DIAGNOSIS — G629 Polyneuropathy, unspecified: Secondary | ICD-10-CM | POA: Insufficient documentation

## 2019-02-11 DIAGNOSIS — C50112 Malignant neoplasm of central portion of left female breast: Secondary | ICD-10-CM

## 2019-02-11 DIAGNOSIS — R188 Other ascites: Secondary | ICD-10-CM | POA: Diagnosis not present

## 2019-02-11 LAB — COMPREHENSIVE METABOLIC PANEL
ALT: 20 U/L (ref 0–44)
AST: 48 U/L — ABNORMAL HIGH (ref 15–41)
Albumin: 2.5 g/dL — ABNORMAL LOW (ref 3.5–5.0)
Alkaline Phosphatase: 126 U/L (ref 38–126)
Anion gap: 6 (ref 5–15)
BUN: 6 mg/dL (ref 6–20)
CO2: 24 mmol/L (ref 22–32)
Calcium: 8.1 mg/dL — ABNORMAL LOW (ref 8.9–10.3)
Chloride: 104 mmol/L (ref 98–111)
Creatinine, Ser: 0.57 mg/dL (ref 0.44–1.00)
GFR calc Af Amer: >60 mL/min (ref >60)
GFR calc non Af Amer: >60 mL/min (ref >60)
Glucose, Bld: 138 mg/dL — ABNORMAL HIGH (ref 70–99)
Potassium: 3.8 mmol/L (ref 3.5–5.1)
Sodium: 134 mmol/L — ABNORMAL LOW (ref 135–145)
Total Bilirubin: 0.8 mg/dL (ref 0.3–1.2)
Total Protein: 5.4 g/dL — ABNORMAL LOW (ref 6.5–8.1)

## 2019-02-11 LAB — CBC WITH DIFFERENTIAL/PLATELET
Abs Immature Granulocytes: 0.01 10*3/uL (ref 0.00–0.07)
Basophils Absolute: 0 10*3/uL (ref 0.0–0.1)
Basophils Relative: 2 %
Eosinophils Absolute: 0 10*3/uL (ref 0.0–0.5)
Eosinophils Relative: 0 %
HCT: 25.5 % — ABNORMAL LOW (ref 36.0–46.0)
Hemoglobin: 8.1 g/dL — ABNORMAL LOW (ref 12.0–15.0)
Immature Granulocytes: 1 %
Lymphocytes Relative: 30 %
Lymphs Abs: 0.5 10*3/uL — ABNORMAL LOW (ref 0.7–4.0)
MCH: 26.6 pg (ref 26.0–34.0)
MCHC: 31.8 g/dL (ref 30.0–36.0)
MCV: 83.6 fL (ref 80.0–100.0)
Monocytes Absolute: 0.1 10*3/uL (ref 0.1–1.0)
Monocytes Relative: 6 %
Neutro Abs: 1.1 10*3/uL — ABNORMAL LOW (ref 1.7–7.7)
Neutrophils Relative %: 61 %
Platelets: 116 10*3/uL — ABNORMAL LOW (ref 150–400)
RBC: 3.05 MIL/uL — ABNORMAL LOW (ref 3.87–5.11)
RDW: 20.4 % — ABNORMAL HIGH (ref 11.5–15.5)
WBC: 1.8 10*3/uL — ABNORMAL LOW (ref 4.0–10.5)
nRBC: 0 % (ref 0.0–0.2)

## 2019-02-11 LAB — MAGNESIUM: Magnesium: 1.6 mg/dL — ABNORMAL LOW (ref 1.7–2.4)

## 2019-02-11 NOTE — Assessment & Plan Note (Addendum)
1.  Metastatic breast cancer to the liver, ER/PR positive and HER-2 negative: - Abemaciclib and Faslodex from 07/02/2018 through 09/07/2018 with progression. - ERCP and plastic stent in hepatic duct on 09/22/2018 by Dr. Laural Golden. - Paclitaxel 60 mg per metered square 3 weeks on 1 week off started on 09/18/2018. - She could not tolerate Xeloda when it was added to the Taxol. - She could not tolerate dose increase in Taxol with mucositis.  Because of cytopenias, we have changed her regimen to every 2 weeks on 12/24/2018. - She received Taxol at dose reduction of 40 mg/m on 02/04/2019. - She was admitted to the hospital with colitis and received IV antibiotics. -She is continuing to be weak.  She reported abdominal distention and having shortness of breath as a result of that. - Her ANC is 1100 today.  Hemoglobin is 8.1. -We will see her back in 1 to 2 weeks.  2.  Hypomagnesemia: -She is continue magnesium tablets.  Magnesium is 1.6 today.  3.  Ascites: - This is from hepatic metastatic disease.  She is taking spironolactone 25 mg daily. -Lasix 20 mg was on hold since hospitalization.  I have told her to start back on it. -As there is more accumulation, will have paracentesis done.  3.  Neuropathy: - She is taking gabapentin 300 mg at bedtime which is helping burning sensation in the feet.  4.  Nausea: -She takes Zofran alternating with Compazine.

## 2019-02-11 NOTE — Progress Notes (Signed)
[place], [place] [address]   CLINIC:  Medical Oncology/Hematology  PCP:  Doree Albee, MD [place] [address] [phone]   REASON FOR VISIT:  Follow-up for metastatic breast cancer.   BRIEF ONCOLOGIC HISTORY:  Oncology History  Metastatic breast cancer ([place])  07/02/2018 Initial Diagnosis   Metastatic breast cancer ([place])   09/18/2018 -  Chemotherapy   The patient had palonosetron (ALOXI) injection 0.25 mg, 0.25 mg, Intravenous,  Once, 4 of 4 cycles Administration: 0.25 mg (10/01/2018), 0.25 mg (10/09/2018), 0.25 mg (10/23/2018), 0.25 mg (10/30/2018), 0.25 mg (11/12/2018), 0.25 mg (12/03/2018), 0.25 mg (12/24/2018), 0.25 mg (01/22/2019), 0.25 mg (01/07/2019), 0.25 mg (02/04/2019) PACLitaxel (TAXOL) 120 mg in sodium chloride 0.9 % 250 mL chemo infusion (</= 74m/m2), 60 mg/m2 = 120 mg (100 % of original dose 60 mg/m2), Intravenous,  Once, 4 of 4 cycles Dose modification: 60 mg/m2 (original dose 60 mg/m2, Cycle 1, Reason: Change in LFTs), 80 mg/m2 (original dose 60 mg/m2, Cycle 2, Reason: Provider Judgment), 54 mg/m2 (90 % of original dose 60 mg/m2, Cycle 3, Reason: Other (see comments), Comment: hepatic dysfunction), 40 mg/m2 (66.7 % of original dose 60 mg/m2, Cycle 4, Reason: Other (see comments), Comment: neutropenia) Administration: 120 mg (09/18/2018), 120 mg (10/01/2018), 120 mg (10/23/2018), 120 mg (10/09/2018), 120 mg (10/30/2018), 162 mg (11/12/2018), 162 mg (12/03/2018), 108 mg (12/24/2018), 108 mg (01/22/2019), 108 mg (01/07/2019), 78 mg (02/04/2019)  for chemotherapy treatment.       CANCER STAGING: Cancer Staging No matching staging information was found for the patient.   INTERVAL HISTORY:  Ms. NProwell89y.o. female seen for follow-up of metastatic breast cancer to the liver.  Last treatment was on 02/04/2019.  Subsequently she was hospitalized with colitis.  She received IV antibiotics.  Lasix has been hold since then.  She  reported increasing swelling of her abdomen.  Neuropathy is controlled well with the gabapentin.  She reports taking magnesium tablets.  Occasional dizziness has been stable.  Nausea is controlled with Zofran and Compazine.  Constipation is also well controlled.  Denies any fevers or chills.  REVIEW OF SYSTEMS:  Review of Systems  Respiratory: Positive for shortness of breath.   Cardiovascular: Positive for leg swelling.  Neurological: Positive for dizziness and numbness.  All other systems reviewed and are negative.    PAST MEDICAL/SURGICAL HISTORY:  Past Medical History:  Diagnosis Date  . Anemia   . Anxiety   . Asthma   . [place]    Left, 2017  . Complication of anesthesia   . Depression   . GERD (gastroesophageal reflux disease)   . Migraine   . OAB (overactive bladder)   . PONV (postoperative nausea and vomiting)   . Seasonal allergies    Past Surgical History:  Procedure Laterality Date  . ABDOMINAL HYSTERECTOMY     breast cancer  . ANKLE RECONSTRUCTION Right 1989  . BILIARY STENT PLACEMENT N/A 09/22/2018   Procedure: BILIARY STENT PLACEMENT;  Surgeon: RRogene Houston MD;  [facility];  Service: Endoscopy;  Laterality: N/A;  . BREAST SURGERY    . COLONOSCOPY WITH PROPOFOL N/A 08/06/2018   Procedure: COLONOSCOPY WITH PROPOFOL;  Surgeon: FDanie Binder MD;  Location: [facility];  Service: Endoscopy;  Laterality: N/A;  1:30pm  . ERCP N/A 09/22/2018   Procedure: ENDOSCOPIC RETROGRADE CHOLANGIOPANCREATOGRAPHY (ERCP);  Surgeon: RRogene Houston MD;  Location: [facility];  Service: Endoscopy;  Laterality: N/A;  . FOREIGN BODY REMOVAL N/A 08/29/2017   from lip  . KNEE SURGERY Right 1989   bone spur  . MASTECTOMY  2017   bil mastectomies  . PORTACATH PLACEMENT Left 12/23/2018   Procedure: INSERTION PORT-A-CATH LEFT SUBCLAVIAN;  Surgeon: Aviva Signs, MD;  Location: [facility];  Service: General;  Laterality: Left;  . SINUSOTOMY       SOCIAL  HISTORY:  Social History   Socioeconomic History  . Marital status: Single    Spouse name: Not on file  . Number of children: 1  . Years of education: 31  . Highest education level: Not on file  Occupational History  . Occupation: disabled  Social Needs  . Financial resource strain: Somewhat hard  . Food insecurity    Worry: Sometimes true    Inability: Sometimes true  . Transportation needs    Medical: No    Non-medical: No  Tobacco Use  . Smoking status: Never Smoker  . Smokeless tobacco: Never Used  Substance and Sexual Activity  . Alcohol use: No  . Drug use: No  . Sexual activity: Not Currently  Lifestyle  . Physical activity    Days per week: 0 days    Minutes per session: 0 min  . Stress: Rather much  Relationships  . Social Herbalist on phone: Once a week    Gets together: Once a week    Attends religious service: 1 to 4 times per year    Active member of club or organization: Yes    Attends meetings of clubs or organizations: Never    Relationship status: Never married  . Intimate partner violence    Fear of current or ex partner: No    Emotionally abused: No    Physically abused: No    Forced sexual activity: No  Other Topics Concern  . Not on file  Social History Narrative   Bachelors degree   Accounting   Lives with daughter [name] who has autism and diabetes   Likes to sew, quilt, crafts, crochet    FAMILY HISTORY:  Family History  Problem Relation Age of Onset  . Arthritis Mother   . COPD Mother   . Depression Mother   . Diabetes Mother   . Kidney disease Father   . Heart disease Father 31  . Drug abuse Father   . Hypertension Father   . Diabetes Daughter   . Hashimoto's thyroiditis Daughter   . Irritable bowel syndrome Daughter   . Autism spectrum disorder Daughter   . Early death Maternal Grandmother        drowned  . Early death Maternal Grandfather   . Heart disease Maternal Grandfather   . Heart disease Paternal  Grandfather   . Breast cancer Maternal Aunt   . Breast cancer Cousin   . Lung cancer Maternal Aunt   . Lung cancer Maternal Uncle   . Colon cancer Neg Hx     CURRENT MEDICATIONS:  Outpatient Encounter Medications as of 02/11/2019  Medication Sig Note  . acetaminophen (TYLENOL) 325 MG tablet Take 2 tablets (650 mg total) by mouth every 6 (six) hours as needed for mild pain, fever or headache (T over 101).   Marland Kitchen escitalopram (LEXAPRO) 20 MG tablet Take 1 tablet (20 mg total) by mouth daily.   . feeding supplement, ENSURE ENLIVE, (ENSURE ENLIVE) LIQD Take 237 mLs by mouth 2 (two) times daily between meals. (Patient not taking: Reported on 02/04/2019) 02/04/2019:  Pt hasn't started yet  . furosemide (LASIX) 20 MG tablet Take 20 mg by mouth as needed.    . gabapentin (NEURONTIN) 300 MG capsule Take 1 capsule (300 mg total) by mouth at bedtime.   . lidocaine-prilocaine (EMLA) cream Apply 1 application topically as needed.   . magic mouthwash w/lidocaine SOLN Take 5 mLs by mouth 4 (four) times daily as needed for mouth pain. 01/28/2019: Takes when needed  . magnesium oxide (MAG-OX) 400 MG tablet TAKE 1 TABLET BY MOUTH EVERY DAY   . Multiple Vitamin (MULTIVITAMIN WITH MINERALS) TABS tablet Take 1 tablet by mouth daily. (Patient not taking: Reported on 02/04/2019)   . oxyCODONE-acetaminophen (PERCOCET) 5-325 MG tablet Take 1 tablet by mouth every 4 (four) hours as needed for moderate pain. (Patient not taking: Reported on 02/04/2019) 01/28/2019: Only takes when needed  . PACLitaxel (TAXOL IV) Inject into the vein once a week.    . pantoprazole (PROTONIX) 40 MG tablet TAKE 1 TABLET BY MOUTH EVERY DAY BEFORE BREAKFAST   . Polyvinyl Alcohol-Povidone PF (REFRESH) 1.4-0.6 % SOLN Place 1 drop into both eyes as needed (for dry eyes).    . potassium chloride SA (K-DUR) 20 MEQ tablet Take 2 tablets (40 mEq total) by mouth daily.   . rizatriptan (MAXALT-MLT) 10 MG disintegrating tablet Take 10 mg by mouth every 2 (two)  hours as needed for migraine.  01/28/2019: As needed  . scopolamine (TRANSDERM-SCOP) 1 MG/3DAYS Place 1 patch (1.5 mg total) onto the skin every 3 (three) days.   Marland Kitchen senna-docusate (SENOKOT-S) 8.6-50 MG tablet Take 2 tablets by mouth at bedtime as needed for mild constipation or moderate constipation.   Marland Kitchen spironolactone (ALDACTONE) 25 MG tablet TAKE 1 TABLET BY MOUTH EVERY DAY   . temazepam (RESTORIL) 15 MG capsule Take 1 capsule (15 mg total) by mouth at bedtime as needed for sleep.    No facility-administered encounter medications on file as of 02/11/2019.     ALLERGIES:  Allergies  Allergen Reactions  . Salagen [Pilocarpine] Other (See Comments)    Can cause liver failure   . Tape Itching and Other (See Comments)    Depending on the adhesive-blistering occurs  . Amoxicillin Hives and Other (See Comments)    Rash only DID THE REACTION INVOLVE: Swelling of the face/tongue/throat, SOB, or low BP? Sudden or severe rash/hives, skin peeling, or the inside of the mouth or nose?  Did it require medical treatment?  When did it last happen? If all above answers are "NO", may proceed with cephalosporin use.   . Caffeine Diarrhea, Nausea Only, Palpitations and Other (See Comments)    Headache  . Tetanus Toxoids Swelling and Other (See Comments)    Local reaction     PHYSICAL EXAM:  ECOG Performance status: 1 Blood pressure is 126/53.  Pulse rate is 110.  Respirate is 18.  Temperature 98.6.  Saturations are 96%. Physical Exam Vitals signs reviewed.  Constitutional:      Appearance: Normal appearance.  Cardiovascular:     Rate and Rhythm: Normal rate and regular rhythm.     Heart sounds: Normal heart sounds.  Pulmonary:     Effort: Pulmonary effort is normal.     Breath sounds: Normal breath sounds.  Abdominal:     General: There is no distension.     Palpations: Abdomen is soft. There is no mass.  Musculoskeletal:        General: No swelling.  Skin:    General: Skin is warm.  Neurological:     General: No focal deficit present.     Mental Status: She is alert and oriented to person, place, and time.  Psychiatric:        Mood and Affect: Mood normal.        Behavior: Behavior normal.      LABORATORY DATA:  I have reviewed the labs as listed.  CBC    Component Value Date/Time   WBC 1.8 (L) 02/11/2019 0958   RBC 3.05 (L) 02/11/2019 0958   HGB 8.1 (L) 02/11/2019 0958   HCT 25.5 (L) 02/11/2019 0958   PLT 116 (L) 02/11/2019 0958   MCV 83.6 02/11/2019 0958   MCH 26.6 02/11/2019 0958   MCHC 31.8 02/11/2019 0958   RDW 20.4 (H) 02/11/2019 0958   LYMPHSABS 0.5 (L) 02/11/2019 0958   MONOABS 0.1 02/11/2019 0958   EOSABS 0.0 02/11/2019 0958   BASOSABS 0.0 02/11/2019 0958   CMP Latest Ref Rng & Units 02/11/2019 02/04/2019 02/01/2019  Glucose 70 - 99 mg/dL 138(H) 121(H) 99  BUN 6 - 20 mg/dL '6 7 6  ' Creatinine 0.44 - 1.00 mg/dL 0.57 0.59 0.63  Sodium 135 - 145 mmol/L 134(L) 136 134(L)  Potassium 3.5 - 5.1 mmol/L 3.8 3.7 3.5  Chloride 98 - 111 mmol/L 104 104 105  CO2 22 - 32 mmol/L '24 25 23  ' Calcium 8.9 - 10.3 mg/dL 8.1(L) 8.4(L) 8.0(L)  Total Protein 6.5 - 8.1 g/dL 5.4(L) 5.4(L) -  Total Bilirubin 0.3 - 1.2 mg/dL 0.8 0.8 -  Alkaline Phos 38 - 126 U/L 126 138(H) -  AST 15 - 41 U/L 48(H) 39 -  ALT 0 - 44 U/L 20 14 -       DIAGNOSTIC IMAGING:  I have independently reviewed the scans and discussed with the patient.   I have reviewed Venita Lick LPN's note and agree with the documentation.  I personally performed a face-to-face visit, made revisions and my assessment and plan is as follows.    ASSESSMENT & PLAN:   Malignant neoplasm of central portion of left breast in female, estrogen receptor positive ([place]) 1.  Metastatic breast cancer to the liver, ER/PR positive and HER-2 negative: - Abemaciclib and Faslodex from 07/02/2018 through 09/07/2018 with progression. - ERCP and plastic stent in hepatic duct on 09/22/2018 by Dr. Laural Golden. - Paclitaxel 60 mg  per metered square 3 weeks on 1 week off started on 09/18/2018. - She could not tolerate Xeloda when it was added to the Taxol. - She could not tolerate dose increase in Taxol with mucositis.  Because of cytopenias, we have changed her regimen to every 2 weeks on 12/24/2018. - She received Taxol at dose reduction of 40 mg/m on 02/04/2019. - She was admitted to the hospital with colitis and received IV antibiotics. -She is continuing to be weak.  She reported abdominal distention and having shortness of breath as a result of that. - Her ANC is 1100 today.  Hemoglobin is 8.1. -We will see her back in 1 to 2 weeks.  2.  Hypomagnesemia: -She is continue magnesium tablets.  Magnesium is 1.6 today.  3.  Ascites: - This is from hepatic metastatic disease.  She is taking spironolactone 25 mg daily. -Lasix 20 mg was on hold since hospitalization.  I have told her to start back on it. -As there is more accumulation, will have paracentesis done.  3.  Neuropathy: - She is taking gabapentin 300 mg at bedtime which is helping burning sensation in the  feet.  4.  Nausea: -She takes Zofran alternating with Compazine.  Total time spent is 25 minutes with more than 50% of the time spent face-to-face discussing scan results, further plan and counseling and coordination of care.  Orders placed this encounter:  Orders Placed This Encounter  Procedures  . US Paracentesis  . Magnesium      Derek Jack, MD [place] [phone]

## 2019-02-11 NOTE — Patient Instructions (Addendum)
[place] at [facility] Discharge Instructions  You were seen today by Dr. [name]. He went over your recent lab results. He will get you scheduled for a paracentesis. He will see you back in 1 week for labs and follow up.  Start taking your Lasix everyday   Thank you for choosing [place] at [facility] to provide your oncology and hematology care.  To afford each patient quality time with our provider, please arrive at least 15 minutes before your scheduled appointment time.   If you have a lab appointment with the [facility] please come in thru the  Main Entrance and check in at the main information desk  You need to re-schedule your appointment should you arrive 10 or more minutes late.  We strive to give you quality time with our providers, and arriving late affects you and other patients whose appointments are after yours.  Also, if you no show three or more times for appointments you may be dismissed from the clinic at the providers discretion.     Again, thank you for choosing [facility].  Our hope is that these requests will decrease the amount of time that you wait before being seen by our physicians.       _____________________________________________________________  Should you have questions after your visit to [facility], please contact our office at [phone] between the hours of 8:00 a.m. and 4:30 p.m.  Voicemails left after 4:00 p.m. will not be returned until the following business day.  For prescription refill requests, have your pharmacy contact our office and allow 72 hours.    Cancer Center Support Programs:   > Cancer Support Group  2nd Tuesday of the month 1pm-2pm, Journey Room

## 2019-02-12 ENCOUNTER — Ambulatory Visit (HOSPITAL_COMMUNITY)
Admission: RE | Admit: 2019-02-12 | Discharge: 2019-02-12 | Disposition: A | Discharge status: Home / Self Care | Payer: 59 | Source: Ambulatory Visit | Attending: Hematology | Admitting: Hematology

## 2019-02-12 DIAGNOSIS — C50919 Malignant neoplasm of unspecified site of unspecified female breast: Secondary | ICD-10-CM | POA: Diagnosis not present

## 2019-02-12 DIAGNOSIS — R188 Other ascites: Secondary | ICD-10-CM | POA: Diagnosis present

## 2019-02-12 IMAGING — US US PARACENTESIS
1 series · 6 of 6 positions shown · non-contrast
Comparison: none

INDICATION: Metastatic breast cancer, ascites

[Series 1: us paracentesis · 0.25mm/px · 6 of 6 slices shown]
[im 1/6]
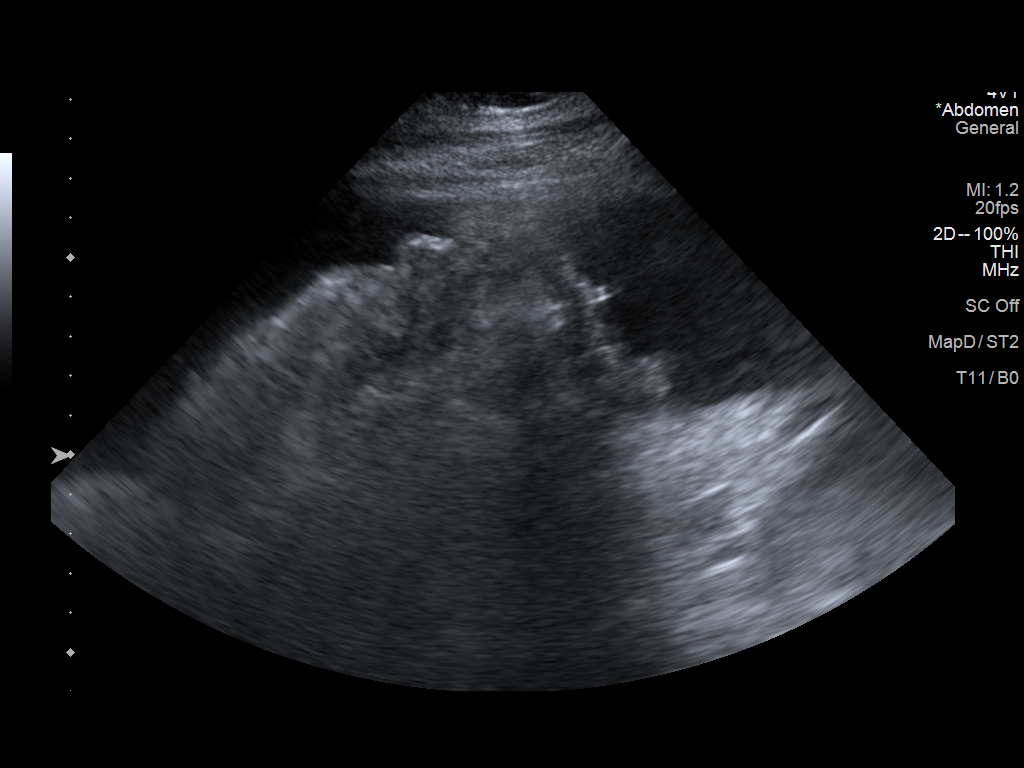
[im 2/6]
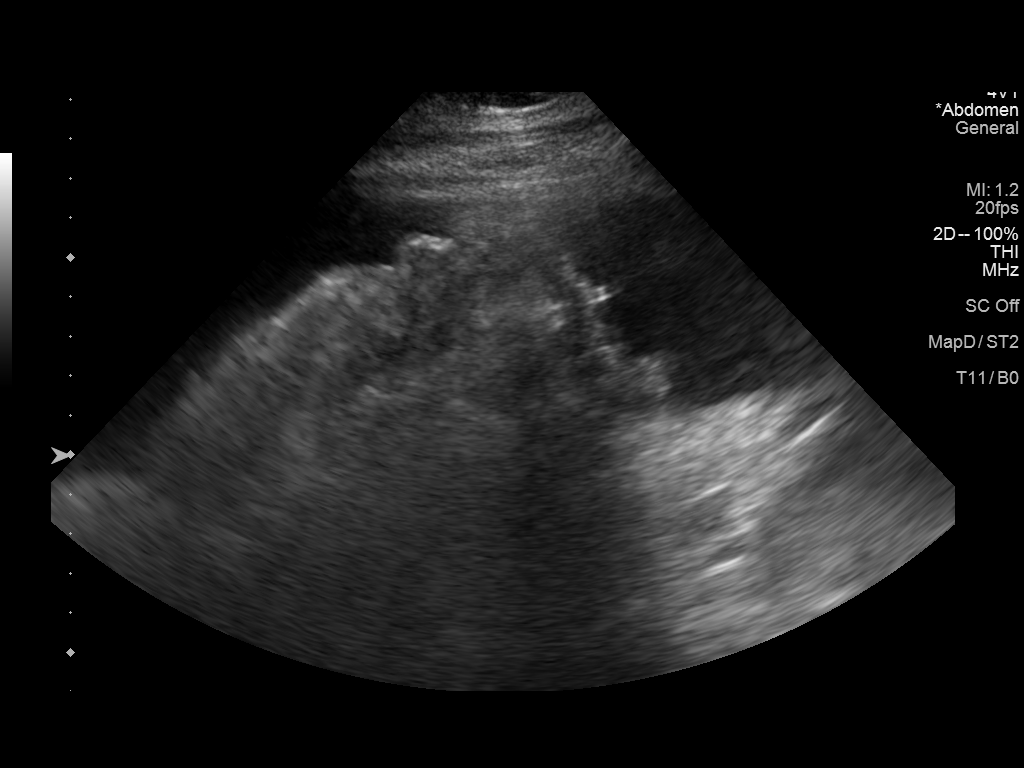
[im 3/6]
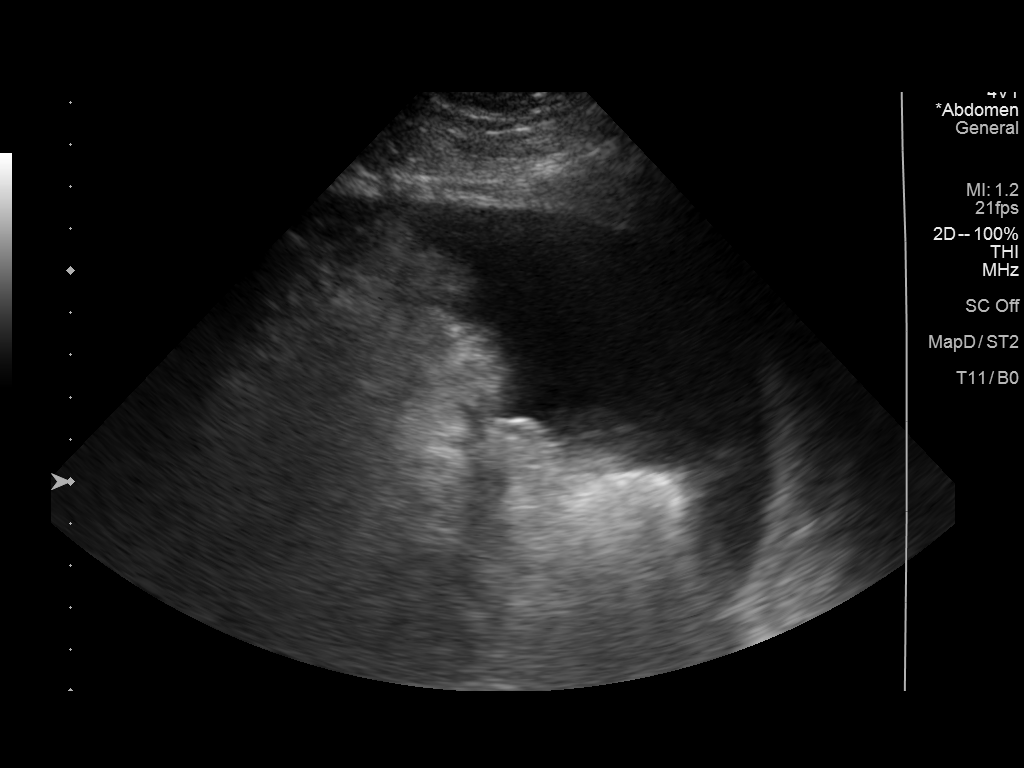
[im 4/6]
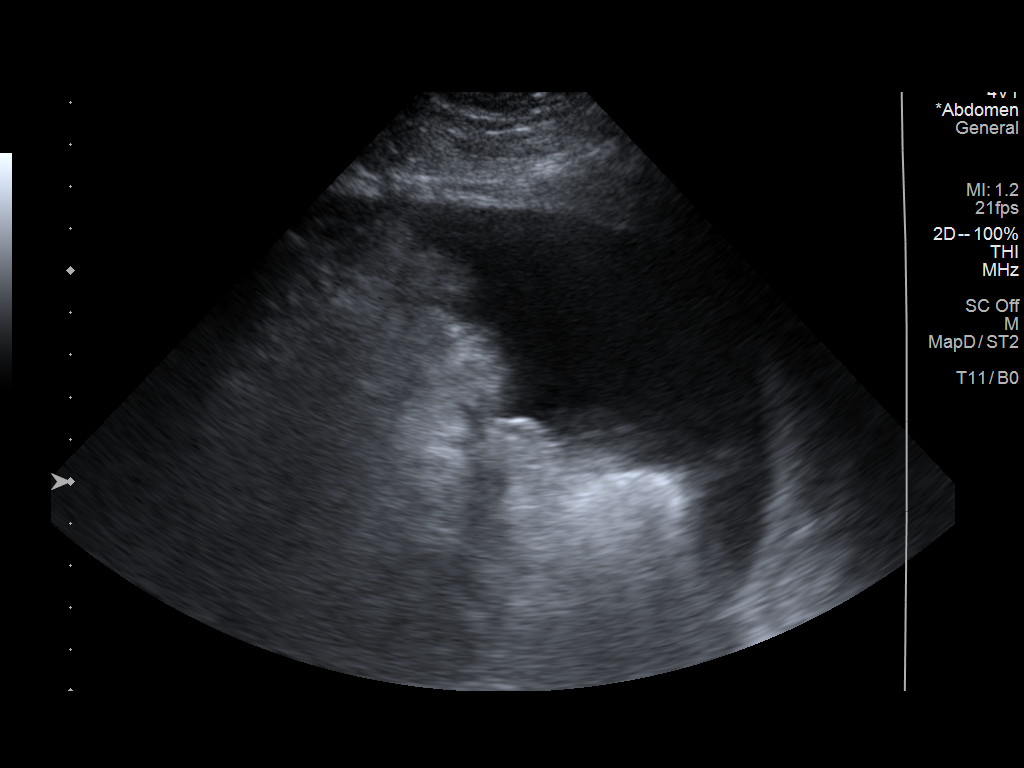
[im 5/6]
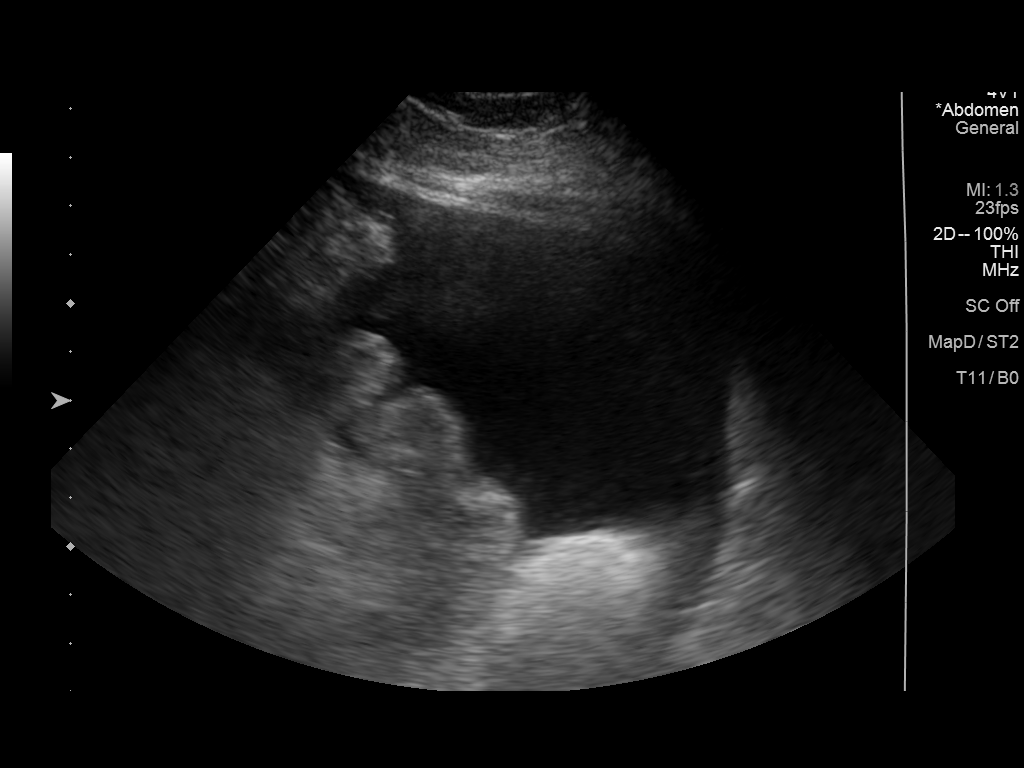
[im 6/6]
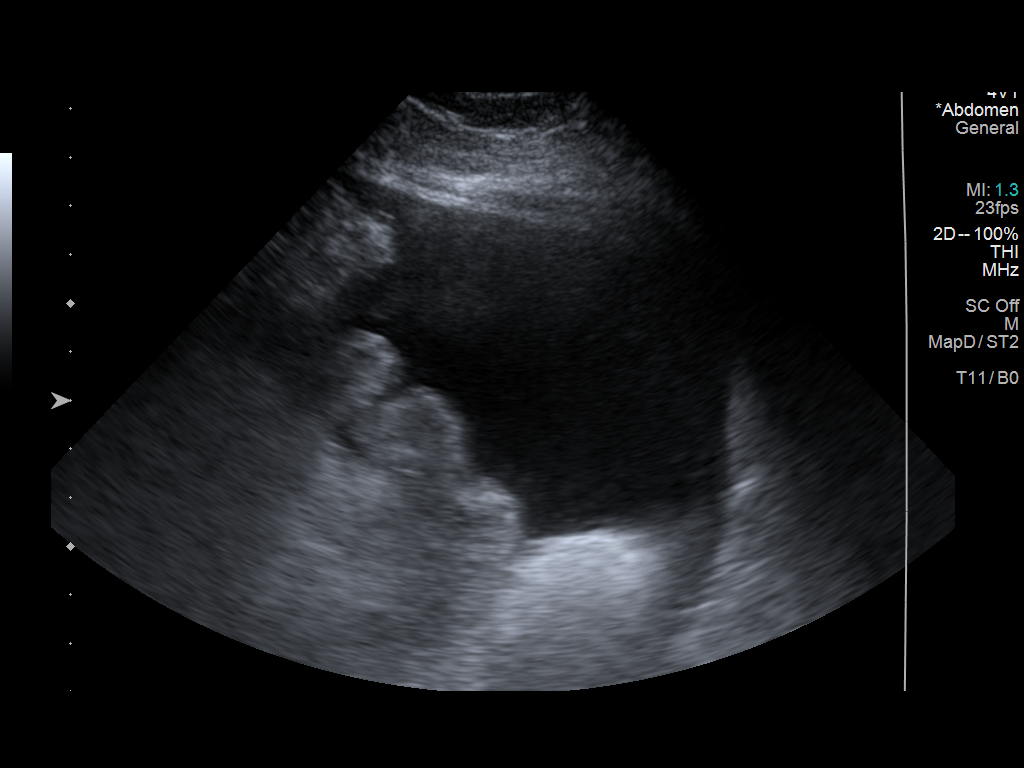

[6 of 6 positions shown; findings below may reference images not displayed]

EXAM:
ULTRASOUND GUIDED THERAPEUTIC PARACENTESIS

MEDICATIONS:
None.

COMPLICATIONS:
None immediate.

PROCEDURE:
Procedure, benefits, and risks of procedure were discussed with
patient.

Written informed consent for procedure was obtained.

Time out protocol followed.

Adequate collection of ascites localized by ultrasound in LEFT lower
quadrant.

Skin prepped and draped in usual sterile fashion.

Skin and soft tissues anesthetized with 10 mL of 1% lidocaine.

5 French Yueh catheter placed into peritoneal cavity.

2.3 L of yellow ascitic fluid aspirated by vacuum bottle suction.

Procedure tolerated well by patient without immediate complication.
FINDINGS: A total of approximately 2.3 L of ascitic fluid was removed.
IMPRESSION: Successful ultrasound-guided paracentesis yielding 2.3 liters of
peritoneal fluid.

## 2019-02-12 NOTE — Procedures (Signed)
PreOperative Dx: Malignant ascites Postoperative Dx: Malignant ascites Procedure:   US guided paracentesis Radiologist:  Thornton Papas Anesthesia:  10 ml of1% lidocaine Specimen:  2.3 L of yellow ascitic fluid EBL:   < 1 ml Complications: None

## 2019-02-17 ENCOUNTER — Other Ambulatory Visit (HOSPITAL_COMMUNITY): Payer: Self-pay

## 2019-02-17 DIAGNOSIS — C50919 Malignant neoplasm of unspecified site of unspecified female breast: Secondary | ICD-10-CM

## 2019-02-18 ENCOUNTER — Inpatient Hospital Stay (HOSPITAL_BASED_OUTPATIENT_CLINIC_OR_DEPARTMENT_OTHER): Payer: 59 | Admitting: Hematology

## 2019-02-18 ENCOUNTER — Other Ambulatory Visit: Payer: Self-pay

## 2019-02-18 ENCOUNTER — Inpatient Hospital Stay (HOSPITAL_COMMUNITY): Payer: 59

## 2019-02-18 ENCOUNTER — Encounter (HOSPITAL_COMMUNITY): Payer: Self-pay

## 2019-02-18 ENCOUNTER — Encounter (HOSPITAL_COMMUNITY): Payer: Self-pay | Admitting: Hematology

## 2019-02-18 DIAGNOSIS — C50919 Malignant neoplasm of unspecified site of unspecified female breast: Secondary | ICD-10-CM

## 2019-02-18 DIAGNOSIS — Z5111 Encounter for antineoplastic chemotherapy: Secondary | ICD-10-CM | POA: Diagnosis not present

## 2019-02-18 DIAGNOSIS — C50112 Malignant neoplasm of central portion of left female breast: Secondary | ICD-10-CM

## 2019-02-18 DIAGNOSIS — Z170 Estrogen receptor positive status [ER+]: Secondary | ICD-10-CM

## 2019-02-18 LAB — COMPREHENSIVE METABOLIC PANEL
ALT: 24 U/L (ref 0–44)
AST: 45 U/L — ABNORMAL HIGH (ref 15–41)
Albumin: 2.7 g/dL — ABNORMAL LOW (ref 3.5–5.0)
Alkaline Phosphatase: 174 U/L — ABNORMAL HIGH (ref 38–126)
Anion gap: 6 (ref 5–15)
BUN: 10 mg/dL (ref 6–20)
CO2: 26 mmol/L (ref 22–32)
Calcium: 8.3 mg/dL — ABNORMAL LOW (ref 8.9–10.3)
Chloride: 105 mmol/L (ref 98–111)
Creatinine, Ser: 0.53 mg/dL (ref 0.44–1.00)
GFR calc Af Amer: >60 mL/min (ref >60)
GFR calc non Af Amer: >60 mL/min (ref >60)
Glucose, Bld: 128 mg/dL — ABNORMAL HIGH (ref 70–99)
Potassium: 3.2 mmol/L — ABNORMAL LOW (ref 3.5–5.1)
Sodium: 137 mmol/L (ref 135–145)
Total Bilirubin: 1.1 mg/dL (ref 0.3–1.2)
Total Protein: 5.8 g/dL — ABNORMAL LOW (ref 6.5–8.1)

## 2019-02-18 LAB — CBC WITH DIFFERENTIAL/PLATELET
Abs Immature Granulocytes: 0.01 10*3/uL (ref 0.00–0.07)
Basophils Absolute: 0 10*3/uL (ref 0.0–0.1)
Basophils Relative: 2 %
Eosinophils Absolute: 0 10*3/uL (ref 0.0–0.5)
Eosinophils Relative: 1 %
HCT: 28.8 % — ABNORMAL LOW (ref 36.0–46.0)
Hemoglobin: 8.7 g/dL — ABNORMAL LOW (ref 12.0–15.0)
Immature Granulocytes: 1 %
Lymphocytes Relative: 39 %
Lymphs Abs: 0.7 10*3/uL (ref 0.7–4.0)
MCH: 25.8 pg — ABNORMAL LOW (ref 26.0–34.0)
MCHC: 30.2 g/dL (ref 30.0–36.0)
MCV: 85.5 fL (ref 80.0–100.0)
Monocytes Absolute: 0.6 10*3/uL (ref 0.1–1.0)
Monocytes Relative: 33 %
Neutro Abs: 0.4 10*3/uL — ABNORMAL LOW (ref 1.7–7.7)
Neutrophils Relative %: 24 %
Platelets: 145 10*3/uL — ABNORMAL LOW (ref 150–400)
RBC: 3.37 MIL/uL — ABNORMAL LOW (ref 3.87–5.11)
RDW: 20.8 % — ABNORMAL HIGH (ref 11.5–15.5)
WBC: 1.7 10*3/uL — ABNORMAL LOW (ref 4.0–10.5)
nRBC: 0 % (ref 0.0–0.2)

## 2019-02-18 MED ORDER — HEPARIN SOD (PORK) LOCK FLUSH 100 UNIT/ML IV SOLN
500.0000 [IU] | Freq: Once | INTRAVENOUS | Status: AC
  Administered 2019-02-18: 500 [IU] via INTRAVENOUS

## 2019-02-18 MED ORDER — SODIUM CHLORIDE 0.9% FLUSH
10.0000 mL | INTRAVENOUS | Status: DC | PRN
  Administered 2019-02-18: 10 mL via INTRAVENOUS
  Filled 2019-02-18: qty 10

## 2019-02-18 MED ORDER — FILGRASTIM-SNDZ 480 MCG/0.8ML IJ SOSY
480.0000 ug | PREFILLED_SYRINGE | Freq: Once | INTRAMUSCULAR | Status: AC
  Administered 2019-02-18: 480 ug via SUBCUTANEOUS
  Filled 2019-02-18: qty 0.8

## 2019-02-18 MED ORDER — TBO-FILGRASTIM 480 MCG/0.8ML ~~LOC~~ SOSY: 480.0000 ug | PREFILLED_SYRINGE | Freq: Once | SUBCUTANEOUS | Status: DC

## 2019-02-18 MED ORDER — PANTOPRAZOLE SODIUM 40 MG PO TBEC: DELAYED_RELEASE_TABLET | ORAL | 3 refills | Status: DC

## 2019-02-18 NOTE — Progress Notes (Signed)
0850 Labs reviewed with and pt seen by Dr. [name] and chemo tx to be held today with Zarxio injection x 1 today per MD orders                                                                Pamela Long tolerated Zarxio injection well without complaints or incident. VSS Reviewed neutropenic precautions with the pt and instructed her to call for temp > 100.5. Pt verbalized understanding. Pt discharged self ambulatory in satisfactory condition

## 2019-02-18 NOTE — Assessment & Plan Note (Signed)
1.  Metastatic breast cancer to the liver, ER/PR positive and HER-2 negative: - Abemaciclib and Faslodex from 07/02/2018 through 09/07/2018 with progression. - ERCP and plastic stent in hepatic duct on 09/22/2018 by Dr. Laural Golden. - Paclitaxel 60 mg per metered square 3 weeks on 1 week off started on 09/18/2018. - She could not tolerate Xeloda when it was added to the Taxol. - She could not tolerate dose increase in Taxol with mucositis.  Because of cytopenias, we have changed her regimen to every 2 weeks on 12/24/2018. - Last dose of Taxol at dose reduction of 40 mg per metered square on 02/04/2019. -She was subsequently admitted to the hospital with colitis and received IV antibiotics. - We reviewed her labs today.  ANC is 400. -We will hold her chemotherapy.  She will receive Neupogen today.  She will come back next week for labs and possible therapy.  2.  Hypomagnesemia: -She will continue magnesium supplements.  3.  Ascites: -This is from hepatic metastatic disease. -She will continue Lasix 20 mg daily and spironolactone 25 mg daily. -Last paracentesis on 02/12/2019 with 2.3 L removed.  3.  Neuropathy: -She takes gabapentin 300 mg at bedtime which is helping burning sensation in her feet.  4.  Nausea: -She takes Zofran alternating with Compazine.

## 2019-02-18 NOTE — Patient Instructions (Addendum)
[place] at [facility] Discharge Instructions  Chemo tx held today. Zarxio  injection given. Follow-up as scheduled. Call clinic for any questions or concerns   Thank you for choosing [place] at [facility] to provide your oncology and hematology care.  To afford each patient quality time with our provider, please arrive at least 15 minutes before your scheduled appointment time.   If you have a lab appointment with the [place] please come in thru the Main Entrance and check in at the main information desk.  You need to re-schedule your appointment should you arrive 10 or more minutes late.  We strive to give you quality time with our providers, and arriving late affects you and other patients whose appointments are after yours.  Also, if you no show three or more times for appointments you may be dismissed from the clinic at the providers discretion.     Again, thank you for choosing [facility].  Our hope is that these requests will decrease the amount of time that you wait before being seen by our physicians.       _____________________________________________________________  Should you have questions after your visit to [facility], please contact our office at [phone] between the hours of 8:00 a.m. and 4:30 p.m.  Voicemails left after 4:00 p.m. will not be returned until the following business day.  For prescription refill requests, have your pharmacy contact our office and allow 72 hours.    Due to Covid, you will need to wear a mask upon entering the hospital. If you do not have a mask, a mask will be given to you at the Main Entrance upon arrival. For doctor visits, patients may have 1 support person with them. For treatment visits, patients can not have anyone with them due to social distancing guidelines and our immunocompromised population.

## 2019-02-18 NOTE — Progress Notes (Signed)
[place],  [address]   CLINIC:  Medical Oncology/Hematology  PCP:  Doree Albee, MD [place] [address] [phone]   REASON FOR VISIT:  Follow-up for metastatic breast cancer.   BRIEF ONCOLOGIC HISTORY:  Oncology History  Metastatic breast cancer ([place])  07/02/2018 Initial Diagnosis   Metastatic breast cancer ([place])   09/18/2018 -  Chemotherapy   The patient had palonosetron (ALOXI) injection 0.25 mg, 0.25 mg, Intravenous,  Once, 4 of 4 cycles Administration: 0.25 mg (10/01/2018), 0.25 mg (10/09/2018), 0.25 mg (10/23/2018), 0.25 mg (10/30/2018), 0.25 mg (11/12/2018), 0.25 mg (12/03/2018), 0.25 mg (12/24/2018), 0.25 mg (01/22/2019), 0.25 mg (01/07/2019), 0.25 mg (02/04/2019) PACLitaxel (TAXOL) 120 mg in sodium chloride 0.9 % 250 mL chemo infusion (</= 47m/m2), 60 mg/m2 = 120 mg (100 % of original dose 60 mg/m2), Intravenous,  Once, 4 of 4 cycles Dose modification: 60 mg/m2 (original dose 60 mg/m2, Cycle 1, Reason: Change in LFTs), 80 mg/m2 (original dose 60 mg/m2, Cycle 2, Reason: Provider Judgment), 54 mg/m2 (90 % of original dose 60 mg/m2, Cycle 3, Reason: Other (see comments), Comment: hepatic dysfunction), 40 mg/m2 (66.7 % of original dose 60 mg/m2, Cycle 4, Reason: Other (see comments), Comment: neutropenia) Administration: 120 mg (09/18/2018), 120 mg (10/01/2018), 120 mg (10/23/2018), 120 mg (10/09/2018), 120 mg (10/30/2018), 162 mg (11/12/2018), 162 mg (12/03/2018), 108 mg (12/24/2018), 108 mg (01/22/2019), 108 mg (01/07/2019), 78 mg (02/04/2019)  for chemotherapy treatment.       CANCER STAGING: Cancer Staging No matching staging information was found for the patient.   INTERVAL HISTORY:  Ms. 89. female seen for follow-up of metastatic breast cancer to the liver.  Last treatment was on 02/04/2019.  She had abdominal paracentesis done on 02/12/2019 with 2.3 L removed.  She lost about 6 pounds as a result.  She does report  some pain in the lower part of the abdomen after eating, mainly in the evenings.  It goes away after 30 to 60 minutes.  Appetite and energy levels are 50%.  Shortness of breath on exertion is stable.  Occasional left ankle swelling is present.  Nausea is controlled with the current regimen.  Numbness in the feet is also better with gabapentin at nighttime.  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  REVIEW OF SYSTEMS:  Review of Systems  Respiratory: Positive for shortness of breath.   Cardiovascular: Positive for leg swelling.  Neurological: Positive for dizziness and numbness.  All other systems reviewed and are negative.    PAST MEDICAL/SURGICAL HISTORY:  Past Medical History:  Diagnosis Date   Anemia    Anxiety    Asthma    Breast cancer ([place]    Left, [address]  Complication of anesthesia    Depression    GERD (gastroesophageal reflux disease)    Migraine    OAB (overactive bladder)    PONV (postoperative nausea and vomiting)    Seasonal allergies    Past Surgical History:  Procedure Laterality Date   ABDOMINAL HYSTERECTOMY     breast cancer   ANKLE RECONSTRUCTION Right 1989   BILIARY STENT PLACEMENT N/A 09/22/2018   Procedure: BILIARY STENT PLACEMENT;  Surgeon: RRogene Houston MD;  [facility];  Service: Endoscopy;  Laterality: N/A;   BREAST SURGERY     COLONOSCOPY WITH PROPOFOL N/A 08/06/2018   Procedure: COLONOSCOPY WITH PROPOFOL;  Surgeon: FDanie Binder MD;  Location: [facility];  Service: Endoscopy;  Laterality: N/A;  1:30pm   ERCP N/A 09/22/2018   Procedure: ENDOSCOPIC RETROGRADE CHOLANGIOPANCREATOGRAPHY (ERCP);  Surgeon: Rogene Houston, MD;  Location: [facility];  Service: Endoscopy;  Laterality: N/A;   FOREIGN BODY REMOVAL N/A 08/29/2017   from lip   KNEE SURGERY Right 1989   bone spur   MASTECTOMY  2017   bil mastectomies   PORTACATH PLACEMENT Left 12/23/2018   Procedure: INSERTION PORT-A-CATH LEFT SUBCLAVIAN;   Surgeon: Aviva Signs, MD;  Location: [facility];  Service: General;  Laterality: Left;   SINUSOTOMY       SOCIAL HISTORY:  Social History   Socioeconomic History   Marital status: Single    Spouse name: Not on file   Number of children: 1   Years of education: 16   Highest education level: Not on file  Occupational History   Occupation: disabled  Scientist, product/process development strain: Somewhat hard   Food insecurity    Worry: Sometimes true    Inability: Sometimes true   Transportation needs    Medical: No    Non-medical: No  Tobacco Use   Smoking status: Never Smoker   Smokeless tobacco: Never Used  Substance and Sexual Activity   Alcohol use: No   Drug use: No   Sexual activity: Not Currently  Lifestyle   Physical activity    Days per week: 0 days    Minutes per session: 0 min   Stress: Rather much  Relationships   Social connections    Talks on phone: Once a week    Gets together: Once a week    Attends religious service: 1 to 4 times per year    Active member of club or organization: Yes    Attends meetings of clubs or organizations: Never    Relationship status: Never married   Intimate partner violence    Fear of current or ex partner: No    Emotionally abused: No    Physically abused: No    Forced sexual activity: No  Other Topics Concern   Not on file  Social History Narrative   Bachelors degree   Accounting   Lives with daughter [name] who has autism and diabetes   Likes to sew, quilt, crafts, crochet    FAMILY HISTORY:  Family History  Problem Relation Age of Onset   Arthritis Mother    COPD Mother    Depression Mother    Diabetes Mother    Kidney disease Father    Heart disease Father 29   Drug abuse Father    Hypertension Father    Diabetes Daughter    Hashimoto's thyroiditis Daughter    Irritable bowel syndrome Daughter    Autism spectrum disorder Daughter    Early death Maternal Grandmother         drowned   Early death Maternal Grandfather    Heart disease Maternal Grandfather    Heart disease Paternal Grandfather    Breast cancer Maternal Aunt    Breast cancer Cousin    Lung cancer Maternal Aunt    Lung cancer Maternal Uncle    Colon cancer Neg Hx     CURRENT MEDICATIONS:  Outpatient Encounter Medications as of 02/18/2019  Medication Sig Note   acetaminophen (TYLENOL) 325 MG tablet Take 2 tablets (650 mg total) by mouth every 6 (six) hours as needed for mild pain, fever or headache (T over 101).    escitalopram (LEXAPRO) 20 MG tablet Take 1 tablet (20 mg total)  by mouth daily.    feeding supplement, ENSURE ENLIVE, (ENSURE ENLIVE) LIQD Take 237 mLs by mouth 2 (two) times daily between meals. (Patient not taking: Reported on 02/04/2019) 02/04/2019: Pt hasn't started yet   furosemide (LASIX) 20 MG tablet Take 20 mg by mouth as needed.     gabapentin (NEURONTIN) 300 MG capsule Take 1 capsule (300 mg total) by mouth at bedtime.    lidocaine-prilocaine (EMLA) cream Apply 1 application topically as needed.    magic mouthwash w/lidocaine SOLN Take 5 mLs by mouth 4 (four) times daily as needed for mouth pain. 01/28/2019: Takes when needed   magnesium oxide (MAG-OX) 400 MG tablet TAKE 1 TABLET BY MOUTH EVERY DAY    Multiple Vitamin (MULTIVITAMIN WITH MINERALS) TABS tablet Take 1 tablet by mouth daily. (Patient not taking: Reported on 02/04/2019)    oxyCODONE-acetaminophen (PERCOCET) 5-325 MG tablet Take 1 tablet by mouth every 4 (four) hours as needed for moderate pain. (Patient not taking: Reported on 02/04/2019) 01/28/2019: Only takes when needed   PACLitaxel (TAXOL IV) Inject into the vein once a week.     pantoprazole (PROTONIX) 40 MG tablet TAKE 1 TABLET BY MOUTH EVERY DAY BEFORE BREAKFAST    Polyvinyl Alcohol-Povidone PF (REFRESH) 1.4-0.6 % SOLN Place 1 drop into both eyes as needed (for dry eyes).     potassium chloride SA (K-DUR) 20 MEQ tablet Take 2 tablets (40 mEq  total) by mouth daily.    rizatriptan (MAXALT-MLT) 10 MG disintegrating tablet Take 10 mg by mouth every 2 (two) hours as needed for migraine.  01/28/2019: As needed   scopolamine (TRANSDERM-SCOP) 1 MG/3DAYS Place 1 patch (1.5 mg total) onto the skin every 3 (three) days.    senna-docusate (SENOKOT-S) 8.6-50 MG tablet Take 2 tablets by mouth at bedtime as needed for mild constipation or moderate constipation.    spironolactone (ALDACTONE) 25 MG tablet TAKE 1 TABLET BY MOUTH EVERY DAY    temazepam (RESTORIL) 15 MG capsule Take 1 capsule (15 mg total) by mouth at bedtime as needed for sleep.    [DISCONTINUED] pantoprazole (PROTONIX) 40 MG tablet TAKE 1 TABLET BY MOUTH EVERY DAY BEFORE BREAKFAST    [DISCONTINUED] Tbo-Filgrastim (GRANIX) injection 480 mcg  02/18/2019: RN releasing zarxio per insurance   No facility-administered encounter medications on file as of 02/18/2019.     ALLERGIES:  Allergies  Allergen Reactions   Salagen [Pilocarpine] Other (See Comments)    Can cause liver failure    Tape Itching and Other (See Comments)    Depending on the adhesive-blistering occurs   Amoxicillin Hives and Other (See Comments)    Rash only DID THE REACTION INVOLVE: Swelling of the face/tongue/throat, SOB, or low BP? Sudden or severe rash/hives, skin peeling, or the inside of the mouth or nose?  Did it require medical treatment?  When did it last happen? If all above answers are "NO", may proceed with cephalosporin use.    Caffeine Diarrhea, Nausea Only, Palpitations and Other (See Comments)    Headache   Tetanus Toxoids Swelling and Other (See Comments)    Local reaction     PHYSICAL EXAM:  ECOG Performance status: 1 Blood pressure is 126/53.  Pulse rate is 110.  Respirate is 18.  Temperature 98.6.  Saturations are 96%. Physical Exam Vitals signs reviewed.  Constitutional:      Appearance: Normal appearance.  Cardiovascular:     Rate and Rhythm: Normal rate and regular  rhythm.     Heart sounds: Normal heart sounds.  Pulmonary:     Effort: Pulmonary effort is normal.     Breath sounds: Normal breath sounds.  Abdominal:     General: There is no distension.     Palpations: Abdomen is soft. There is no mass.  Musculoskeletal:        General: No swelling.  Skin:    General: Skin is warm.  Neurological:     General: No focal deficit present.     Mental Status: She is alert and oriented to person, place, and time.  Psychiatric:        Mood and Affect: Mood normal.        Behavior: Behavior normal.      LABORATORY DATA:  I have reviewed the labs as listed.  CBC    Component Value Date/Time   WBC 1.7 (L) 02/18/2019 0749   RBC 3.37 (L) 02/18/2019 0749   HGB 8.7 (L) 02/18/2019 0749   HCT 28.8 (L) 02/18/2019 0749   PLT 145 (L) 02/18/2019 0749   MCV 85.5 02/18/2019 0749   MCH 25.8 (L) 02/18/2019 0749   MCHC 30.2 02/18/2019 0749   RDW 20.8 (H) 02/18/2019 0749   LYMPHSABS 0.7 02/18/2019 0749   MONOABS 0.6 02/18/2019 0749   EOSABS 0.0 02/18/2019 0749   BASOSABS 0.0 02/18/2019 0749   CMP Latest Ref Rng & Units 02/18/2019 02/11/2019 02/04/2019  Glucose 70 - 99 mg/dL 128(H) 138(H) 121(H)  BUN 6 - 20 mg/dL _0 Creatinine 0.44 - 1.00 mg/dL 0.53 0.57 0.59  Sodium 135 - 145 mmol/L 137 134(L) 136  Potassium 3.5 - 5.1 mmol/L 3.2(L) 3.8 3.7  Chloride 98 - 111 mmol/L 105 104 104  CO2 22 - 32 mmol/L _1 Calcium 8.9 - 10.3 mg/dL 8.3(L) 8.1(L) 8.4(L)  Total Protein 6.5 - 8.1 g/dL 5.8(L) 5.4(L) 5.4(L)  Total Bilirubin 0.3 - 1.2 mg/dL 1.1 0.8 0.8  Alkaline Phos 38 - 126 U/L 174(H) 126 138(H)  AST 15 - 41 U/L 45(H) 48(H) 39  ALT 0 - 44 U/L _2 DIAGNOSTIC IMAGING:  I have independently reviewed the scans and discussed with the patient.   I have reviewed Venita Lick LPN's note and agree with the documentation.  I personally performed a face-to-face visit, made revisions and my assessment and plan is as follows.    ASSESSMENT &  PLAN:   Metastatic breast cancer ([place]) 1.  Metastatic breast cancer to the liver, ER/PR positive and HER-2 negative: - Abemaciclib and Faslodex from 07/02/2018 through 09/07/2018 with progression. - ERCP and plastic stent in hepatic duct on 09/22/2018 by Dr. Laural Golden. - Paclitaxel 60 mg per metered square 3 weeks on 1 week off started on 09/18/2018. - She could not tolerate Xeloda when it was added to the Taxol. - She could not tolerate dose increase in Taxol with mucositis.  Because of cytopenias, we have changed her regimen to every 2 weeks on 12/24/2018. - Last dose of Taxol at dose reduction of 40 mg per metered square on 02/04/2019. -She was subsequently admitted to the hospital with colitis and received IV antibiotics. - We reviewed her labs today.  ANC is 400. -We will hold her chemotherapy.  She will receive Neupogen today.  She will come back next week for labs and possible therapy.  2.  Hypomagnesemia: -She will continue magnesium supplements.  3.  Ascites: -This is from hepatic metastatic disease. -She will continue Lasix 20 mg daily and spironolactone 25 mg  daily. -Last paracentesis on 02/12/2019 with 2.3 L removed.  3.  Neuropathy: -She takes gabapentin 300 mg at bedtime which is helping burning sensation in her feet.  4.  Nausea: -She takes Zofran alternating with Compazine.  Total time spent is 25 minutes with more than 50% of the time spent face-to-face discussing labs, counseling and coordination of care.  Orders placed this encounter:  No orders of the defined types were placed in this encounter.     Derek Jack, MD [place] [phone]

## 2019-02-18 NOTE — Patient Instructions (Addendum)
[facility] at [facility] Discharge Instructions  You were seen today by Dr. Katragadda. He went over your recent lab results. He will see you back in 1 week for labs, treatment and follow up.   Thank you for choosing [facility] at [facility] to provide your oncology and hematology care.  To afford each patient quality time with our provider, please arrive at least 15 minutes before your scheduled appointment time.   If you have a lab appointment with the Cancer Center please come in thru the  Main Entrance and check in at the main information desk  You need to re-schedule your appointment should you arrive 10 or more minutes late.  We strive to give you quality time with our providers, and arriving late affects you and other patients whose appointments are after yours.  Also, if you no show three or more times for appointments you may be dismissed from the clinic at the providers discretion.     Again, thank you for choosing [facility].  Our hope is that these requests will decrease the amount of time that you wait before being seen by our physicians.       _____________________________________________________________  Should you have questions after your visit to  Cancer Center, please contact our office at [phone] between the hours of 8:00 a.m. and 4:30 p.m.  Voicemails left after 4:00 p.m. will not be returned until the following business day.  For prescription refill requests, have your pharmacy contact our office and allow 72 hours.    Cancer Center Support Programs:   > Cancer Support Group  2nd Tuesday of the month 1pm-2pm, Journey Room    

## 2019-02-19 ENCOUNTER — Telehealth (HOSPITAL_COMMUNITY): Payer: Self-pay

## 2019-02-19 NOTE — Telephone Encounter (Signed)
Nutrition  Patient identified on Malnutrition Screening tool with weight loss and poor appetite.   Called patient to introduce self and service at [facility].  No answer.  Left message with call back number.  Morayo Leven B. Zenia Resides, [place], [place] Registered Dietitian [phone] (pager)

## 2019-02-22 ENCOUNTER — Other Ambulatory Visit (HOSPITAL_COMMUNITY): Payer: Self-pay | Admitting: Hematology

## 2019-02-25 ENCOUNTER — Inpatient Hospital Stay (HOSPITAL_COMMUNITY): Payer: 59

## 2019-02-25 ENCOUNTER — Other Ambulatory Visit (HOSPITAL_COMMUNITY): Payer: Self-pay | Admitting: *Deleted

## 2019-02-25 ENCOUNTER — Inpatient Hospital Stay (HOSPITAL_BASED_OUTPATIENT_CLINIC_OR_DEPARTMENT_OTHER): Payer: 59 | Admitting: Hematology

## 2019-02-25 ENCOUNTER — Other Ambulatory Visit: Payer: Self-pay

## 2019-02-25 VITALS — BP 109/62 | HR 90 | Temp 97.1°F | Resp 18

## 2019-02-25 VITALS — BP 113/69 | HR 100 | Temp 97.1°F | Resp 18 | Wt 171.4 lb

## 2019-02-25 DIAGNOSIS — Z5111 Encounter for antineoplastic chemotherapy: Secondary | ICD-10-CM | POA: Diagnosis not present

## 2019-02-25 DIAGNOSIS — C50919 Malignant neoplasm of unspecified site of unspecified female breast: Secondary | ICD-10-CM

## 2019-02-25 LAB — COMPREHENSIVE METABOLIC PANEL
ALT: 28 U/L (ref 0–44)
AST: 54 U/L — ABNORMAL HIGH (ref 15–41)
Albumin: 2.6 g/dL — ABNORMAL LOW (ref 3.5–5.0)
Alkaline Phosphatase: 195 U/L — ABNORMAL HIGH (ref 38–126)
Anion gap: 8 (ref 5–15)
BUN: 9 mg/dL (ref 6–20)
CO2: 29 mmol/L (ref 22–32)
Calcium: 8.4 mg/dL — ABNORMAL LOW (ref 8.9–10.3)
Chloride: 100 mmol/L (ref 98–111)
Creatinine, Ser: 0.57 mg/dL (ref 0.44–1.00)
GFR calc Af Amer: >60 mL/min (ref >60)
GFR calc non Af Amer: >60 mL/min (ref >60)
Glucose, Bld: 112 mg/dL — ABNORMAL HIGH (ref 70–99)
Potassium: 2.9 mmol/L — ABNORMAL LOW (ref 3.5–5.1)
Sodium: 137 mmol/L (ref 135–145)
Total Bilirubin: 0.9 mg/dL (ref 0.3–1.2)
Total Protein: 6 g/dL — ABNORMAL LOW (ref 6.5–8.1)

## 2019-02-25 LAB — CBC WITH DIFFERENTIAL/PLATELET
Abs Immature Granulocytes: 0.04 10*3/uL (ref 0.00–0.07)
Basophils Absolute: 0.1 10*3/uL (ref 0.0–0.1)
Basophils Relative: 1 %
Eosinophils Absolute: 0.1 10*3/uL (ref 0.0–0.5)
Eosinophils Relative: 1 %
HCT: 32.1 % — ABNORMAL LOW (ref 36.0–46.0)
Hemoglobin: 10 g/dL — ABNORMAL LOW (ref 12.0–15.0)
Immature Granulocytes: 1 %
Lymphocytes Relative: 13 %
Lymphs Abs: 0.9 10*3/uL (ref 0.7–4.0)
MCH: 26.3 pg (ref 26.0–34.0)
MCHC: 31.2 g/dL (ref 30.0–36.0)
MCV: 84.5 fL (ref 80.0–100.0)
Monocytes Absolute: 0.8 10*3/uL (ref 0.1–1.0)
Monocytes Relative: 12 %
Neutro Abs: 5.1 10*3/uL (ref 1.7–7.7)
Neutrophils Relative %: 72 %
Platelets: 196 10*3/uL (ref 150–400)
RBC: 3.8 MIL/uL — ABNORMAL LOW (ref 3.87–5.11)
RDW: 21.2 % — ABNORMAL HIGH (ref 11.5–15.5)
WBC: 6.9 10*3/uL (ref 4.0–10.5)
nRBC: 0 % (ref 0.0–0.2)

## 2019-02-25 MED ORDER — FAMOTIDINE IN NACL 20-0.9 MG/50ML-% IV SOLN
20.0000 mg | Freq: Once | INTRAVENOUS | Status: AC
  Administered 2019-02-25: 20 mg via INTRAVENOUS
  Filled 2019-02-25: qty 50

## 2019-02-25 MED ORDER — HEPARIN SOD (PORK) LOCK FLUSH 100 UNIT/ML IV SOLN
500.0000 [IU] | Freq: Once | INTRAVENOUS | Status: AC | PRN
  Administered 2019-02-25: 12:00:00 500 [IU]

## 2019-02-25 MED ORDER — POTASSIUM CHLORIDE CRYS ER 20 MEQ PO TBCR
40.0000 meq | EXTENDED_RELEASE_TABLET | Freq: Once | ORAL | Status: AC
  Administered 2019-02-25: 40 meq via ORAL
  Filled 2019-02-25: qty 2

## 2019-02-25 MED ORDER — SODIUM CHLORIDE 0.9 % IV SOLN
40.0000 mg/m2 | Freq: Once | INTRAVENOUS | Status: AC
  Administered 2019-02-25: 78 mg via INTRAVENOUS
  Filled 2019-02-25: qty 13

## 2019-02-25 MED ORDER — SODIUM CHLORIDE 0.9 % IV SOLN
10.0000 mg | Freq: Once | INTRAVENOUS | Status: AC
  Administered 2019-02-25: 10 mg via INTRAVENOUS
  Filled 2019-02-25: qty 10

## 2019-02-25 MED ORDER — PALONOSETRON HCL INJECTION 0.25 MG/5ML
0.2500 mg | Freq: Once | INTRAVENOUS | Status: AC
  Administered 2019-02-25: 09:00:00 0.25 mg via INTRAVENOUS
  Filled 2019-02-25: qty 5

## 2019-02-25 MED ORDER — SODIUM CHLORIDE 0.9 % IV SOLN
Freq: Once | INTRAVENOUS | Status: AC
  Administered 2019-02-25: 09:00:00 via INTRAVENOUS

## 2019-02-25 MED ORDER — SODIUM CHLORIDE 0.9% FLUSH
10.0000 mL | INTRAVENOUS | Status: DC | PRN
  Administered 2019-02-25: 08:00:00 10 mL
  Filled 2019-02-25: qty 10

## 2019-02-25 MED ORDER — OXYCODONE-ACETAMINOPHEN 5-325 MG PO TABS: 1.0000 | ORAL_TABLET | ORAL | 0 refills | Status: DC | PRN

## 2019-02-25 MED ORDER — DIPHENHYDRAMINE HCL 50 MG/ML IJ SOLN
25.0000 mg | Freq: Once | INTRAMUSCULAR | Status: AC
  Administered 2019-02-25: 25 mg via INTRAVENOUS
  Filled 2019-02-25: qty 1

## 2019-02-25 NOTE — Patient Instructions (Signed)
[facility] Discharge Instructions for Patients Receiving Chemotherapy  Today you received the following chemotherapy agents   To help prevent nausea and vomiting after your treatment, we encourage you to take your nausea medication   If you develop nausea and vomiting that is not controlled by your nausea medication, call the clinic.   BELOW ARE SYMPTOMS THAT SHOULD BE REPORTED IMMEDIATELY:  *FEVER GREATER THAN 100.5 F  *CHILLS WITH OR WITHOUT FEVER  NAUSEA AND VOMITING THAT IS NOT CONTROLLED WITH YOUR NAUSEA MEDICATION  *UNUSUAL SHORTNESS OF BREATH  *UNUSUAL BRUISING OR BLEEDING  TENDERNESS IN MOUTH AND THROAT WITH OR WITHOUT PRESENCE OF ULCERS  *URINARY PROBLEMS  *BOWEL PROBLEMS  UNUSUAL RASH Items with * indicate a potential emergency and should be followed up as soon as possible.  Feel free to call the clinic should you have any questions or concerns. The clinic phone number is [phone].  Please show the CHEMO ALERT CARD at check-in to the Emergency Department and triage nurse.   

## 2019-02-25 NOTE — Progress Notes (Signed)
[address], [place] [address]   CLINIC:  Medical Oncology/Hematology  PCP:  Pamela Albee, MD [place] [address] [phone]   REASON FOR VISIT:  Follow-up for metastatic breast cancer.   BRIEF ONCOLOGIC HISTORY:  Oncology History  Metastatic breast cancer ([place])  07/02/2018 Initial Diagnosis   Metastatic breast cancer ([place])   09/18/2018 -  Chemotherapy   The patient had palonosetron (ALOXI) injection 0.25 mg, 0.25 mg, Intravenous,  Once, 4 of 5 cycles Administration: 0.25 mg (10/01/2018), 0.25 mg (10/09/2018), 0.25 mg (10/23/2018), 0.25 mg (10/30/2018), 0.25 mg (11/12/2018), 0.25 mg (12/03/2018), 0.25 mg (12/24/2018), 0.25 mg (01/22/2019), 0.25 mg (01/07/2019), 0.25 mg (02/04/2019), 0.25 mg (02/25/2019) PACLitaxel (TAXOL) 120 mg in sodium chloride 0.9 % 250 mL chemo infusion (</= 60m/m2), 60 mg/m2 = 120 mg (100 % of original dose 60 mg/m2), Intravenous,  Once, 4 of 5 cycles Dose modification: 60 mg/m2 (original dose 60 mg/m2, Cycle 1, Reason: Change in LFTs), 80 mg/m2 (original dose 60 mg/m2, Cycle 2, Reason: Provider Judgment), 54 mg/m2 (90 % of original dose 60 mg/m2, Cycle 3, Reason: Other (see comments), Comment: hepatic dysfunction), 40 mg/m2 (66.7 % of original dose 60 mg/m2, Cycle 4, Reason: Other (see comments), Comment: neutropenia) Administration: 120 mg (09/18/2018), 120 mg (10/01/2018), 120 mg (10/23/2018), 120 mg (10/09/2018), 120 mg (10/30/2018), 162 mg (11/12/2018), 162 mg (12/03/2018), 108 mg (12/24/2018), 108 mg (01/22/2019), 108 mg (01/07/2019), 78 mg (02/04/2019), 78 mg (02/25/2019)  for chemotherapy treatment.       CANCER STAGING: Cancer Staging No matching staging information was found for the patient.   INTERVAL HISTORY:  [name] seen for follow-up of metastatic breast cancer to the liver.  Last treatment was on 02/04/2019.  She feels better today.  Appetite is 50%.  Energy levels are 75%.  Intermittent  right sided abdominal pain is rated as 1 out of 10.  She reports shortness of breath with exertion.  Occasional nausea but denied any vomiting.  Has some numbness in the feet which is helped with gabapentin.  She is not moving around quite well.  REVIEW OF SYSTEMS:  Review of Systems  Respiratory: Positive for shortness of breath.   Gastrointestinal: Positive for nausea.  Neurological: Positive for numbness.  All other systems reviewed and are negative.    PAST MEDICAL/SURGICAL HISTORY:  Past Medical History:  Diagnosis Date   Anemia    Anxiety    Asthma    [place] [address]  Complication of anesthesia    Depression    GERD (gastroesophageal reflux disease)    Migraine    OAB (overactive bladder)    PONV (postoperative nausea and vomiting)    Seasonal allergies    Past Surgical History:  Procedure Laterality Date   ABDOMINAL HYSTERECTOMY     breast cancer   ANKLE RECONSTRUCTION Right 1989   BILIARY STENT PLACEMENT N/A 09/22/2018   Procedure: BILIARY STENT PLACEMENT;  Surgeon: RRogene Houston MD;  Location: [facility];  Service: Endoscopy;  Laterality: N/A;   BREAST SURGERY     COLONOSCOPY WITH PROPOFOL N/A 08/06/2018   Dr. FOneida Alar small internal hemorrhoids, moderate external hemorrhoids. rectal bleeding due to ?anal fissure vs hemorrhoids. nex tcs 10 years   ERCP N/A 09/22/2018   Procedure: ENDOSCOPIC RETROGRADE CHOLANGIOPANCREATOGRAPHY (ERCP);  Surgeon: RRogene Houston MD;  Location: [facility];  Service: Endoscopy;  Laterality: N/A;  FOREIGN BODY REMOVAL N/A 08/29/2017   from lip   KNEE SURGERY Right 1989   bone spur   MASTECTOMY  2017   bil mastectomies   PORTACATH PLACEMENT Left 12/23/2018   Procedure: INSERTION PORT-A-CATH LEFT SUBCLAVIAN;  Surgeon: Aviva Signs, MD;  Location: [facility];  Service: General;  Laterality: Left;   SINUSOTOMY       SOCIAL HISTORY:  Social History   Socioeconomic History   Marital  status: Single    Spouse name: Not on file   Number of children: 1   Years of education: 16   Highest education level: Not on file  Occupational History   Occupation: disabled  Social Designer, fashion/clothing strain: Somewhat hard   Food insecurity    Worry: Sometimes true    Inability: Sometimes true   Transportation needs    Medical: No    Non-medical: No  Tobacco Use   Smoking status: Never Smoker   Smokeless tobacco: Never Used  Substance and Sexual Activity   Alcohol use: No   Drug use: No   Sexual activity: Not Currently  Lifestyle   Physical activity    Days per week: 0 days    Minutes per session: 0 min   Stress: Rather much  Relationships   Social connections    Talks on phone: Once a week    Gets together: Once a week    Attends religious service: 1 to 4 times per year    Active member of club or organization: Yes    Attends meetings of clubs or organizations: Never    Relationship status: Never married   Intimate partner violence    Fear of current or ex partner: No    Emotionally abused: No    Physically abused: No    Forced sexual activity: No  Other Topics Concern   Not on file  Social History Narrative   Bachelors degree   Accounting   Lives with daughter Pamela Long who has autism and diabetes   Likes to sew, quilt, crafts, crochet    FAMILY HISTORY:  Family History  Problem Relation Age of Onset   Arthritis Mother    COPD Mother    Depression Mother    Diabetes Mother    Kidney disease Father    Heart disease Father 52   Drug abuse Father    Hypertension Father    Diabetes Daughter    Hashimoto's thyroiditis Daughter    Irritable bowel syndrome Daughter    Autism spectrum disorder Daughter    Early death Maternal Grandmother        drowned   Early death Maternal Grandfather    Heart disease Maternal Grandfather    Heart disease Paternal Grandfather    Breast cancer Maternal Aunt    Breast cancer  Cousin    Lung cancer Maternal Aunt    Lung cancer Maternal Uncle    Colon cancer Neg Hx     CURRENT MEDICATIONS:  Outpatient Encounter Medications as of 02/25/2019  Medication Sig Note   escitalopram (LEXAPRO) 20 MG tablet Take 1 tablet (20 mg total) by mouth daily.    furosemide (LASIX) 20 MG tablet Take 20 mg by mouth as needed.     gabapentin (NEURONTIN) 300 MG capsule Take 1 capsule (300 mg total) by mouth at bedtime.    magnesium oxide (MAG-OX) 400 MG tablet TAKE 1 TABLET BY MOUTH EVERY DAY    PACLitaxel (TAXOL IV) Inject into the vein once a week.  pantoprazole (PROTONIX) 40 MG tablet TAKE 1 TABLET BY MOUTH EVERY DAY BEFORE BREAKFAST    potassium chloride SA (K-DUR) 20 MEQ tablet Take 2 tablets (40 mEq total) by mouth daily.    spironolactone (ALDACTONE) 25 MG tablet TAKE 1 TABLET BY MOUTH EVERY DAY    temazepam (RESTORIL) 15 MG capsule TAKE 1 CAPSULE (15 MG TOTAL) BY MOUTH AT BEDTIME AS NEEDED FOR SLEEP.    feeding supplement, ENSURE ENLIVE, (ENSURE ENLIVE) LIQD Take 237 mLs by mouth 2 (two) times daily between meals. 02/04/2019: Pt hasn't started yet   lidocaine-prilocaine (EMLA) cream Apply 1 application topically as needed.    magic mouthwash w/lidocaine SOLN Take 5 mLs by mouth 4 (four) times daily as needed for mouth pain. 01/28/2019: Takes when needed   Polyvinyl Alcohol-Povidone PF (REFRESH) 1.4-0.6 % SOLN Place 1 drop into both eyes as needed (for dry eyes).     rizatriptan (MAXALT-MLT) 10 MG disintegrating tablet Take 10 mg by mouth every 2 (two) hours as needed for migraine.  01/28/2019: As needed   scopolamine (TRANSDERM-SCOP) 1 MG/3DAYS Place 1 patch (1.5 mg total) onto the skin every 3 (three) days. (Patient taking differently: Place 1 patch onto the skin as needed. )    [DISCONTINUED] acetaminophen (TYLENOL) 325 MG tablet Take 2 tablets (650 mg total) by mouth every 6 (six) hours as needed for mild pain, fever or headache (T over 101). (Patient not  taking: Reported on 02/25/2019)    [DISCONTINUED] Multiple Vitamin (MULTIVITAMIN WITH MINERALS) TABS tablet Take 1 tablet by mouth daily. (Patient not taking: Reported on 02/04/2019)    [DISCONTINUED] oxyCODONE-acetaminophen (PERCOCET) 5-325 MG tablet Take 1 tablet by mouth every 4 (four) hours as needed for moderate pain. (Patient not taking: Reported on 02/04/2019) 01/28/2019: Only takes when needed   [DISCONTINUED] senna-docusate (SENOKOT-S) 8.6-50 MG tablet Take 2 tablets by mouth at bedtime as needed for mild constipation or moderate constipation. (Patient not taking: Reported on 02/25/2019)    No facility-administered encounter medications on file as of 02/25/2019.     ALLERGIES:  Allergies  Allergen Reactions   Salagen [Pilocarpine] Other (See Comments)    Can cause liver failure    Tape Itching and Other (See Comments)    Depending on the adhesive-blistering occurs   Amoxicillin Hives and Other (See Comments)    Rash only DID THE REACTION INVOLVE: Swelling of the face/tongue/throat, SOB, or low BP? Sudden or severe rash/hives, skin peeling, or the inside of the mouth or nose?  Did it require medical treatment?  When did it last happen? If all above answers are "NO", may proceed with cephalosporin use.    Caffeine Diarrhea, Nausea Only, Palpitations and Other (See Comments)    Headache   Tetanus Toxoids Swelling and Other (See Comments)    Local reaction     PHYSICAL EXAM:  ECOG Performance status: 1 Blood pressure is 113/69.  Pulse rate is 100.  Respiratory is 18.  Temperature 97.1.  Saturations are 96%. Physical Exam Vitals signs reviewed.  Constitutional:      Appearance: Normal appearance.  Cardiovascular:     Rate and Rhythm: Normal rate and regular rhythm.     Heart sounds: Normal heart sounds.  Pulmonary:     Effort: Pulmonary effort is normal.     Breath sounds: Normal breath sounds.  Abdominal:     General: There is no distension.     Palpations:  Abdomen is soft. There is no mass.  Musculoskeletal:  General: No swelling.  Skin:    General: Skin is warm.  Neurological:     General: No focal deficit present.     Mental Status: She is alert and oriented to person, place, and time.  Psychiatric:        Mood and Affect: Mood normal.        Behavior: Behavior normal.      LABORATORY DATA:  I have reviewed the labs as listed.  CBC    Component Value Date/Time   WBC 6.9 02/25/2019 0808   RBC 3.80 (L) 02/25/2019 0808   HGB 10.0 (L) 02/25/2019 0808   HCT 32.1 (L) 02/25/2019 0808   PLT 196 02/25/2019 0808   MCV 84.5 02/25/2019 0808   MCH 26.3 02/25/2019 0808   MCHC 31.2 02/25/2019 0808   RDW 21.2 (H) 02/25/2019 0808   LYMPHSABS 0.9 02/25/2019 0808   MONOABS 0.8 02/25/2019 0808   EOSABS 0.1 02/25/2019 0808   BASOSABS 0.1 02/25/2019 0808   CMP Latest Ref Rng & Units 02/25/2019 02/18/2019 02/11/2019  Glucose 70 - 99 mg/dL 112(H) 128(H) 138(H)  BUN 6 - 20 mg/dL _0 Creatinine 0.44 - 1.00 mg/dL 0.57 0.53 0.57  Sodium 135 - 145 mmol/L 137 137 134(L)  Potassium 3.5 - 5.1 mmol/L 2.9(L) 3.2(L) 3.8  Chloride 98 - 111 mmol/L 100 105 104  CO2 22 - 32 mmol/L _1 Calcium 8.9 - 10.3 mg/dL 8.4(L) 8.3(L) 8.1(L)  Total Protein 6.5 - 8.1 g/dL 6.0(L) 5.8(L) 5.4(L)  Total Bilirubin 0.3 - 1.2 mg/dL 0.9 1.1 0.8  Alkaline Phos 38 - 126 U/L 195(H) 174(H) 126  AST 15 - 41 U/L 54(H) 45(H) 48(H)  ALT 0 - 44 U/L _2 DIAGNOSTIC IMAGING:  I have independently reviewed the scans and discussed with the patient.   I have reviewed Venita Lick LPN's note and agree with the documentation.  I personally performed a face-to-face visit, made revisions and my assessment and plan is as follows.    ASSESSMENT & PLAN:   Metastatic breast cancer ([place]) 1.  Metastatic breast cancer to the liver, ER/PR positive and HER-2 negative: - Abemaciclib and Faslodex from 07/02/2018 through 09/07/2018 with progression. - ERCP and plastic  stent in hepatic duct on 09/22/2018 by Dr. Laural Golden. - Paclitaxel 60 mg per metered square 3 weeks on 1 week off started on 09/18/2018. - She could not tolerate Xeloda when it was added to the Taxol. - She could not tolerate dose increase in Taxol with mucositis.  Because of cytopenias, we have changed her regimen to every 2 weeks on 12/24/2018. - Last dose of paclitaxel at 40 mg/M2 was on 02/04/2019. -She was subsequently admitted to the hospital with colitis and received IV antibiotics.  Chemo was held last week because of neutropenia. - Today she feels much better.  White count and platelets are normal.  We will proceed with next cycle of paclitaxel with the same dose reduction. -She will come back in 2 weeks for follow-up and next treatment. - She will follow-up with Dr. Laural Golden for changing her plastic biliary stent to metal stent. -She does have difficulty with ambulation.  We will refer her to physical therapy.  2.  Hypokalemia/hypomagnesemia: -Her potassium is 2.9 today.  She will receive potassium supplements.  She will continue potassium and magnesium at home.  3.  Ascites: -This is from hepatic metastatic disease. -She will continue Lasix 20 mg daily and spironolactone 25  mg daily. -Last paracentesis on 02/12/2019 with 2.3 L removed.  3.  Neuropathy: -She takes gabapentin 300 mg at bedtime which is helping burning sensation in her feet.  4.  Nausea: -She takes Zofran alternating with Compazine.  Total time spent is 25 minutes with more than 50% of the time spent face-to-face discussing labs, counseling and coordination of care.  Orders placed this encounter:  Orders Placed This Encounter  Procedures   Ambulatory referral to Physical Therapy      Derek Jack, [place] [phone]

## 2019-02-25 NOTE — Patient Instructions (Signed)
[facility] Discharge Instructions  You were seen today by Dr. Katragadda. He went over your recent lab results. He will see you back in 2 weeks for labs and follow up.   Thank you for choosing  Cancer Center at [facility] to provide your oncology and hematology care.  To afford each patient quality time with our provider, please arrive at least 15 minutes before your scheduled appointment time.   If you have a lab appointment with the Cancer Center please come in thru the  Main Entrance and check in at the main information desk  You need to re-schedule your appointment should you arrive 10 or more minutes late.  We strive to give you quality time with our providers, and arriving late affects you and other patients whose appointments are after yours.  Also, if you no show three or more times for appointments you may be dismissed from the clinic at the providers discretion.     Again, thank you for choosing [facility].  Our hope is that these requests will decrease the amount of time that you wait before being seen by our physicians.       _____________________________________________________________  Should you have questions after your visit to [facility], please contact our office at [phone] between the hours of 8:00 a.m. and 4:30 p.m.  Voicemails left after 4:00 p.m. will not be returned until the following business day.  For prescription refill requests, have your pharmacy contact our office and allow 72 hours.    Cancer Center Support Programs:   > Cancer Support Group  2nd Tuesday of the month 1pm-2pm, Journey Room    

## 2019-02-25 NOTE — Progress Notes (Signed)
Labs reviewed with MD, Potassium 2.9, will give 40 meq of potassium per MD order. Proceed with treatment per MD.   Treatment given per orders. Patient tolerated it well without problems. Vitals stable and discharged home from clinic ambulatory. Follow up as scheduled.

## 2019-02-25 NOTE — Progress Notes (Signed)
02/25/19  Per MD he is changing treatment plan deleting day 8 and keeping Days 1 and 15.  Plan updated to reflect request.  T.O. Dr Katragadda/Catherine Page RN/Haskell Rihn Ronnald Ramp, PharmD

## 2019-02-26 ENCOUNTER — Other Ambulatory Visit (HOSPITAL_COMMUNITY): Payer: Self-pay | Admitting: Hematology

## 2019-02-26 DIAGNOSIS — C50919 Malignant neoplasm of unspecified site of unspecified female breast: Secondary | ICD-10-CM

## 2019-03-02 ENCOUNTER — Other Ambulatory Visit: Payer: Self-pay

## 2019-03-02 ENCOUNTER — Encounter: Payer: Self-pay | Admitting: Gastroenterology

## 2019-03-02 ENCOUNTER — Ambulatory Visit (INDEPENDENT_AMBULATORY_CARE_PROVIDER_SITE_OTHER): Payer: 59 | Admitting: Gastroenterology

## 2019-03-02 VITALS — BP 105/67 | HR 121 | Temp 96.2°F | Ht 65.5 in | Wt 167.0 lb

## 2019-03-02 DIAGNOSIS — K5900 Constipation, unspecified: Secondary | ICD-10-CM

## 2019-03-02 DIAGNOSIS — R1011 Right upper quadrant pain: Secondary | ICD-10-CM | POA: Diagnosis not present

## 2019-03-02 DIAGNOSIS — K219 Gastro-esophageal reflux disease without esophagitis: Secondary | ICD-10-CM | POA: Diagnosis not present

## 2019-03-02 MED ORDER — LUBIPROSTONE 8 MCG PO CAPS: 8.0000 ug | ORAL_CAPSULE | Freq: Two times a day (BID) | ORAL | 2 refills | Status: DC

## 2019-03-02 NOTE — Assessment & Plan Note (Addendum)
History of chronic constipation.  Recent admission in August with colitis.  Recent issues with constipation again, would like to explore prescription options as she has required using 2-3 over-the-counter laxatives at a time to be effective in the past.  We discussed starting slow with Amitiza 8 mcg once daily up to twice daily with food, avoiding overshooting dose and development of diarrhea/dehydration.  We will slowly adjust dosage as needed.  Samples and prescription provided.

## 2019-03-02 NOTE — Progress Notes (Signed)
Primary Care Physician: Doree Albee, MD  Primary Gastroenterologist:  Garfield Cornea, MD    Chief Complaint  Patient presents with  . Constipation    occ bleeding    HPI: Pamela Long is a 48 y.o. female here for follow-up.  She has a history of metastatic breast cancer to the liver, diagnosed January 2020 after presenting with right upper quadrant pain.  CT showed 21.8 x 11 x 18.1 cm enhancing hepatic mass with extrinsic compression of gallbladder, stomach, IVC.  Liver biopsy performed at [facility] confirmed metastasis from breast cancer.  Initially treated for breast cancer (2017) while living in [place].  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  ERCP September 22, 2018 by Dr. Laural Golden showed single localized severe biliary stricture in the distal common hepatic duct, stricture appeared malignant, biliary system moderately dilated proximal to stricture with extrinsic compression causing an obstruction.  One plastic stent placed into the common bile duct for biliary decompression.  She has required recent abdominal paracenteses for malignant ascites. Clinical course also complicated by hospitalization in August for colitis, course also complicated by hospitalization for colitis in August.  Most recent imaging CT abdomen pelvis with contrast August 20, stable metastatic lesion noted within the liver, gallbladder decompressed, plastic biliary stent noted in place stable from prior exam, no new focal hepatic lesions.  Sigmoid and descending colon with significant wall thickening beginning at splenic flexure and extending to the sigmoid region.  Patient historically with chronic constipation.  Hospitalized in  August with colitis.  No constipation since treatment up until about 3 to 4 days ago.  Restarted Dulcolax prescribed by oncology.  She has utilized MiraLAX in the past.  Generally requires several laxatives in order to go.  She is interested in prescription options.  Last BM in 3 days.  No melena.  She has bright red blood is significantly constipated.  Appetite okay.  Consuming several small meals daily.  Avoids all spicy foods.  Reflux well controlled.  She has had some intermittent right upper quadrant pain, typically occurs throughout the day but worse at night.  Sometimes worse postprandially.  No vomiting.    Current Outpatient Medications  Medication Sig Dispense Refill  . escitalopram (LEXAPRO) 20 MG tablet Take 1 tablet (20 mg total) by mouth daily. 90 tablet 3  . feeding supplement, ENSURE ENLIVE, (ENSURE ENLIVE) LIQD Take 237 mLs by mouth 2 (two) times daily between meals. 14220 mL 0  . furosemide (LASIX) 20 MG tablet Take 20 mg by mouth as needed.     . gabapentin (NEURONTIN) 300 MG capsule Take 1 capsule (300 mg total) by mouth at bedtime. 30 capsule 1  . lidocaine-prilocaine (EMLA) cream Apply 1 application topically as needed. 30 g 3  . magic mouthwash w/lidocaine SOLN Take 5 mLs by mouth 4 (four) times daily as needed for mouth pain. 360 mL 2  . magnesium oxide (MAG-OX) 400 MG tablet TAKE 1 TABLET BY MOUTH EVERY DAY 90 tablet 1  . oxyCODONE-acetaminophen (PERCOCET) 5-325 MG tablet Take 1 tablet by mouth every 4 (four) hours as needed for moderate pain. 15 tablet 0  . pantoprazole (PROTONIX) 40 MG tablet TAKE 1 TABLET BY MOUTH EVERY DAY BEFORE BREAKFAST 90 tablet 3  .  Polyvinyl Alcohol-Povidone PF (REFRESH) 1.4-0.6 % SOLN Place 1 drop into both eyes as needed (for dry eyes).     . potassium chloride SA (K-DUR) 20 MEQ tablet Take 2 tablets (40 mEq total) by mouth daily. 30 tablet 0  . rizatriptan (MAXALT-MLT) 10 MG disintegrating tablet Take 10 mg by mouth every 2 (two) hours as needed  for migraine.     Marland Kitchen scopolamine (TRANSDERM-SCOP) 1 MG/3DAYS Place 1 patch (1.5 mg total) onto the skin every 3 (three) days. (Patient taking differently: Place 1 patch onto the skin as needed. ) 10 patch 12  . spironolactone (ALDACTONE) 25 MG tablet TAKE 1 TABLET BY MOUTH EVERY DAY 30 tablet 0  . temazepam (RESTORIL) 15 MG capsule TAKE 1 CAPSULE (15 MG TOTAL) BY MOUTH AT BEDTIME AS NEEDED FOR SLEEP. 30 capsule 0  . PACLitaxel (TAXOL IV) Inject into the vein once a week.      No current facility-administered medications for this visit.     Allergies as of 03/02/2019 - Review Complete 03/02/2019  Allergen Reaction Noted  . Salagen [pilocarpine] Other (See Comments) 09/21/2018  . Tape Itching and Other (See Comments) 06/17/2018  . Amoxicillin Hives and Other (See Comments) 02/12/2017  . Caffeine Diarrhea, Nausea Only, Palpitations, and Other (See Comments) 01/16/2017  . Tetanus toxoids Swelling and Other (See Comments) 02/12/2017    ROS:  General: Negative for anorexia,  fever, chills, fatigue, weakness. ENT: Negative for hoarseness, difficulty swallowing , nasal congestion. CV: Negative for chest pain, angina, palpitations, dyspnea on exertion, peripheral edema.  Respiratory: Negative for dyspnea at rest, dyspnea on exertion, cough, sputum, wheezing.  GI: See history of present illness. GU:  Negative for dysuria, hematuria, urinary incontinence, urinary frequency, nocturnal urination.  Endo: Negative for unusual weight change.    Physical Examination:   BP 105/67   Pulse (!) 121   Temp (!) 96.2 F (35.7 C) (Temporal)   Ht 5' 5.5" (1.664 m)   Wt 167 lb (75.8 kg)   BMI 27.37 kg/m   General: Chronically ill-appearing in no acute distress.  Eyes: No icterus. Mouth: masked. Abdomen: Bowel sounds are normal, mild bilateral upper quadrant tenderness, mild distention but soft,   Extremities: No lower extremity edema. No clubbing or deformities. Neuro: Alert and oriented x 4   Skin:  Warm and dry, no jaundice.   Psych: Alert and cooperative, normal mood and affect.  Labs:  Lab Results  Component Value Date   CREATININE 0.57 02/25/2019   BUN 9 02/25/2019   NA 137 02/25/2019   K 2.9 (L) 02/25/2019   CL 100 02/25/2019   CO2 29 02/25/2019   Lab Results  Component Value Date   ALT 28 02/25/2019   AST 54 (H) 02/25/2019   ALKPHOS 195 (H) 02/25/2019   BILITOT 0.9 02/25/2019   Lab Results  Component Value Date   WBC 6.9 02/25/2019   HGB 10.0 (L) 02/25/2019   HCT 32.1 (L) 02/25/2019   MCV 84.5 02/25/2019   PLT 196 02/25/2019   Lab Results  Component Value Date   INR 1.7 (H) 12/12/2018   INR 1.2 09/01/2018    Imaging Studies: US Paracentesis  Result Date: 02/12/2019 INDICATION: Metastatic breast cancer, ascites EXAM: ULTRASOUND GUIDED THERAPEUTIC PARACENTESIS MEDICATIONS: None. COMPLICATIONS: None immediate. PROCEDURE: Procedure, benefits, and risks of procedure were discussed with patient. Written informed consent for procedure was obtained. Time out protocol followed. Adequate collection of ascites localized by ultrasound in LEFT lower quadrant. Skin prepped and draped in usual  sterile fashion. Skin and soft tissues anesthetized with 10 mL of 1% lidocaine. 5 [place] Yueh catheter placed into peritoneal cavity. 2.3 L of yellow ascitic fluid aspirated by vacuum bottle suction. Procedure tolerated well by patient without immediate complication. FINDINGS: A total of approximately 2.3 L of ascitic fluid was removed. IMPRESSION: Successful ultrasound-guided paracentesis yielding 2.3 liters of peritoneal fluid. Electronically Signed   By: Lavonia Dana M.D.   On: 02/12/2019 12:22

## 2019-03-02 NOTE — Assessment & Plan Note (Addendum)
Right upper quadrant pain in the setting of metastatic breast cancer to the liver.  Status post ERCP with plastic biliary stent placement in April for malignant stricture.  Suspect her symptoms are related to tumor burden.  We discussed possibility plastic stent could become obstructed resulting in cholangitis.  Warning signs explained.  Recent labs indicated some mild increase in her alkaline phosphatase, bilirubin normal.    Spoke to Dr. Laural Golden regarding plastic biliary stent. He will touch base with Dr. [name] and patient and make arrangements for biliary stent change.

## 2019-03-02 NOTE — Assessment & Plan Note (Signed)
Clinically doing well on pantoprazole 40 mg daily.

## 2019-03-02 NOTE — Patient Instructions (Signed)
1. Start Amitiza 27mcg once to twice daily for constipation. START WITH ONCE A DAY. IF NOT EFFECTIVE THEN INCREASE TO TWICE A DAY.  2. If you have fever, worsening upper abdominal pain, vomiting, this could be sign of your stent being blocked and you should go to nearest ER.  3. I will speak with Dr. Laural Golden about your plastic biliary stent. Further recommendations to follow.

## 2019-03-03 ENCOUNTER — Encounter (HOSPITAL_COMMUNITY): Payer: Self-pay | Admitting: Physical Therapy

## 2019-03-03 ENCOUNTER — Encounter (HOSPITAL_COMMUNITY): Payer: Self-pay | Admitting: Hematology

## 2019-03-03 ENCOUNTER — Ambulatory Visit (HOSPITAL_COMMUNITY): Payer: 59 | Attending: Hematology | Admitting: Physical Therapy

## 2019-03-03 DIAGNOSIS — M6281 Muscle weakness (generalized): Secondary | ICD-10-CM | POA: Diagnosis present

## 2019-03-03 DIAGNOSIS — R2689 Other abnormalities of gait and mobility: Secondary | ICD-10-CM | POA: Diagnosis not present

## 2019-03-03 NOTE — Patient Instructions (Addendum)
Functional Quadriceps: Sit to Stand    Sit on edge of chair, feet flat on floor. Stand upright, extending knees fully. Repeat _5-10___ times per set. Do _1___ sets per session. Do _2___ sessions per day.  http://orth.exer.us/734   Copyright  VHI. All rights reserved.  Heel Raise: Bilateral (Standing)    Rise on balls of feet. Repeat __10__ times per set. Do ___1_ sets per session. Do _2___ sessions per day.  http://orth.exer.us/38   Copyright  VHI. All rights reserved.  Heel Raise: Unilateral (Standing)   Holding onto the kitchen counter , then slowly lift hour hand up and Balance on left foot; do not raise up onto your toes.  Repeat with right Repeat _3___ times per set. Do __1__ sets per session. Do __3__ sessions per day.  http://orth.exer.us/40   Copyright  VHI. All rights reserved.  Toe Raise (Sitting)    Raise toes, keeping heels on floor. Repeat _10___ times per set. Do _1___ sets per session. Do __2__ sessions per day.  http://orth.exer.us/46   Copyright  VHI. All rights reserved.

## 2019-03-03 NOTE — Assessment & Plan Note (Addendum)
1.  Metastatic breast cancer to the liver, ER/PR positive and HER-2 negative: - Abemaciclib and Faslodex from 07/02/2018 through 09/07/2018 with progression. - ERCP and plastic stent in hepatic duct on 09/22/2018 by Dr. Laural Golden. - Paclitaxel 60 mg per metered square 3 weeks on 1 week off started on 09/18/2018. - She could not tolerate Xeloda when it was added to the Taxol. - She could not tolerate dose increase in Taxol with mucositis.  Because of cytopenias, we have changed her regimen to every 2 weeks on 12/24/2018. - Last dose of paclitaxel at 40 mg/M2 was on 02/04/2019. -She was subsequently admitted to the hospital with colitis and received IV antibiotics.  Chemo was held last week because of neutropenia. - Today she feels much better.  White count and platelets are normal.  We will proceed with next cycle of paclitaxel with the same dose reduction. -She will come back in 2 weeks for follow-up and next treatment. - She will follow-up with Dr. Laural Golden for changing her plastic biliary stent to metal stent. -She does have difficulty with ambulation.  We will refer her to physical therapy.  2.  Hypokalemia/hypomagnesemia: -Her potassium is 2.9 today.  She will receive potassium supplements.  She will continue potassium and magnesium at home.  3.  Ascites: -This is from hepatic metastatic disease. -She will continue Lasix 20 mg daily and spironolactone 25 mg daily. -Last paracentesis on 02/12/2019 with 2.3 L removed.  3.  Neuropathy: -She takes gabapentin 300 mg at bedtime which is helping burning sensation in her feet.  4.  Nausea: -She takes Zofran alternating with Compazine.

## 2019-03-04 ENCOUNTER — Ambulatory Visit (HOSPITAL_COMMUNITY): Payer: 59

## 2019-03-04 ENCOUNTER — Other Ambulatory Visit (HOSPITAL_COMMUNITY): Payer: 59

## 2019-03-04 NOTE — Therapy (Signed)
[place], [place], [address] Phone: [phone]   Fax:  [phone]  Physical Therapy Evaluation  Patient Details  Name: Pamela Long MRN: [id number] Date of Birth: [birth year] Referring Provider (PT): Derek Jack   Encounter Date: 03/03/2019  PT End of Session - 03/03/19 1621    Visit Number  1    Number of Visits  18    Date for PT Re-Evaluation  04/14/19    PT Start Time  1450    PT Stop Time  1530    PT Time Calculation (min)  40 min    Activity Tolerance  Patient limited by fatigue    Behavior During Therapy  [facility] for tasks assessed/performed       Past Medical History:  Diagnosis Date  . Anemia   . Anxiety   . Asthma   . Breast cancer ([place])    Left, 2017  . Complication of anesthesia   . Depression   . GERD (gastroesophageal reflux disease)   . Migraine   . OAB (overactive bladder)   . PONV (postoperative nausea and vomiting)   . Seasonal allergies     Past Surgical History:  Procedure Laterality Date  . ABDOMINAL HYSTERECTOMY     breast cancer  . ANKLE RECONSTRUCTION Right 1989  . BILIARY STENT PLACEMENT N/A 09/22/2018   Procedure: BILIARY STENT PLACEMENT;  Surgeon: Rogene Houston, MD;  [facility];  Service: Endoscopy;  Laterality: N/A;  . BREAST SURGERY    . COLONOSCOPY WITH PROPOFOL N/A 08/06/2018   Dr. Oneida Alar: small internal hemorrhoids, moderate external hemorrhoids. rectal bleeding due to ?anal fissure vs hemorrhoids. nex tcs 10 years  . ERCP N/A 09/22/2018   Procedure: ENDOSCOPIC RETROGRADE CHOLANGIOPANCREATOGRAPHY (ERCP);  Surgeon: Rogene Houston, MD;  Location: [facility];  Service: Endoscopy;  Laterality: N/A;  . FOREIGN BODY REMOVAL N/A 08/29/2017   from lip  . KNEE SURGERY Right 1989   bone spur  . MASTECTOMY  2017   bil mastectomies  . PORTACATH PLACEMENT Left 12/23/2018   Procedure: INSERTION PORT-A-CATH LEFT SUBCLAVIAN;  Surgeon: Aviva Signs,  MD;  Location: [facility] ORS;  Service: General;  Laterality: Left;  . SINUSOTOMY      There were no vitals filed for this visit.   Subjective Assessment - 03/03/19 1459    Subjective  Pt was diagnosed with metastatic cancer in January, she had a liver stent.  She is currently going thru chemo and she is not sure when it is scheduled to end.  She is not as strong as she was, she can not carry anything if she is going [place] she gets tired easily if she is doing chores.    Pertinent History  breast cancer 2017; DX with metastatic breast cancer in Jan 2020    How long can you sit comfortably?  no problem    How long can you stand comfortably?  varies but 10 minutes max.    How long can you walk comfortably?  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    Currently in Pain?  Yes    Pain Score  7     Pain Location  Abdomen    Pain Orientation  Right    Pain Descriptors / Indicators  Aching;Squeezing  Pain Type  Acute pain    Pain Onset  More than a month ago    Pain Frequency  Intermittent    Effect of Pain on Daily Activities  limits            03/03/19 0001  Assessment  Medical Diagnosis Metastatic breast cancer to liver   Referring Provider (PT) Derek Jack  Next MD Visit 03/18/2019 (every other Thursday is chemo )  Prior Therapy none  Precautions  Precautions None  Restrictions  Weight Bearing Restrictions No  Balance Screen  Has the patient fallen in the past 6 months No  Has the patient had a decrease in activity level because of a fear of falling?  Yes  Is the patient reluctant to leave their home because of a fear of falling?  Yes  [place] residence  Type of [place] to enter  Entrance Stairs-Number of Steps 5  Entrance Stairs-Rails Right  [place] One level   Additional Comments sometimes reciprocally other times not   Prior Function  Level of [place] Independent  Vocation Full time employment  Vocation Requirements car shop cleaning  Leisure quilting and crocheting  (have to stand up to cut the quilting )  Cognition  Overall Cognitive Status Within Functional Limits for tasks assessed  Functional Tests  Functional tests Single leg stance;Sit to Stand  Single Leg Stance  Comments Rt:  4", Lt  0"  Sit to Stand  Comments 30"  7x   ROM / Strength  AROM / PROM / Strength Strength  Strength  Strength Assessment Site Hip;Knee;Ankle  Right/Left Hip Right;Left  Right/Left Knee Right;Left  Right/Left Ankle Right;Left  Right Hip Flexion 3/5  Right Hip Extension 3-/5  Right Hip ABduction 3+/5  Left Hip Flexion 3+/5  Left Hip Extension 3-/5  Left Hip ABduction 4-/5  Right Knee Flexion 3+/5  Right Knee Extension 5/5  Left Knee Flexion 3/5  Left Knee Extension 5/5  Right Ankle Dorsiflexion 3+/5  Right Ankle Plantar Flexion 2+/5  Left Ankle Dorsiflexion 3+/5  Left Ankle Plantar Flexion 2+/5  Ambulation/Gait  Ambulation Distance (Feet) 316 Feet (3 MWT)            Exercises:  5 sit to stand  Single leg stance x 2 B  Heel raise x 10  Toe raise x 10    3 minute walk (pt fatigued)  Educated in starting to walk 3 x a day begin at 3 minutes.  When pt has gotten to where she can Walk for 15 minutes she may decrease to 2 x a day or work up to 30 minutes/day with goal of walking At least 30 minutes a day 4 x a week.              PT Short Term Goals - 03/03/19 0813      PT SHORT TERM GOAL #1   Title  PT core and LE strength to be increased by one grade to allow pt to be able to complete her housework without having to rest.    Time  3    Period  Weeks    Status  New    Target Date  03/24/19      PT SHORT TERM GOAL #2   Title  PT to be able to walk for 15 minutes at a time to be able to complete short  shopping trips with confidence.    Time  3  Period  Weeks      PT SHORT TERM GOAL #3   Title  PT to be able to single leg stance for 30 seconds on both LE to reduce risk of falling.    Time  3    Period  Weeks    Status  New        PT Long Term Goals - 03/03/19 1517      PT LONG TERM GOAL #1   Title  PT core and LE strength to be increased by one grade to allow pt to carry groceries up her steps into her house without difficulty.    Time  6    Period  Weeks    Status  New    Target Date  04/14/19      PT LONG TERM GOAL #2   Title  PT to be able to single leg stance for at least 45 seconds bilaterally to be able to be confident walking on uneven terrain.    Time  6    Period  Weeks    Status  New      PT LONG TERM GOAL #3   Title  P  pain to be no greater than 3/10 to be able to complete her normal activities for daily living in greater comfort.    Time  6    Period  Weeks    Status  New             Plan - 03/03/19 1622    Clinical Impression Statement  Pamela. Huntley is a 48 yo female who is currently recieving chemo treatments for metastatic breast cancer who is concerned due to her progressing weakness.  She has been referred to skilled PT for an evaluation; evaluation findings are as follows:  decreased strength in core and bilateral LE, decreased activity tolerance, decreased balance and increased pain.  Pamela Long will benefit from skilled PT to address these issues and maximize her functioning ability.    Personal Factors and Comorbidities  Comorbidity 1    Comorbidities  metastatic cancer    Examination-Activity Limitations  Carry;Lift;Locomotion Level;Squat;Stairs;Stand    Examination-Participation Restrictions  Cleaning;Community Activity;Laundry;Meal Prep;Shop;Other;Yard Work    Public affairs consultant  Low    Rehab Potential  Good    PT Frequency  3x / week    PT Duration  6 weeks    PT  Treatment/Interventions  ADLs/Self Care Home Management;Gait training;Stair training;Therapeutic activities;Therapeutic exercise;Balance training;Patient/family education    PT Next Visit Plan  begin t-band postural exercises and give as a HEP, PNF D1,D2 patterns for core strengthening, tandem stance activities, progress balance and core and LE strengthening    PT Home Exercise Plan  sit to stand, heel raises, single leg stance and walking program.       Patient will benefit from skilled therapeutic intervention in order to improve the following deficits and impairments:  Abnormal gait, Cardiopulmonary status limiting activity, Decreased endurance, Decreased activity tolerance, Decreased balance, Decreased strength, Difficulty walking, Pain  Visit Diagnosis: Other abnormalities of gait and mobility  Muscle weakness (generalized)     Problem List Patient Active Problem List   Diagnosis Date Noted  . Colitis 01/28/2019  . Chemotherapy-induced neutropenia ([place])   . Dehydration   . Lactic acid acidosis   . Nausea vomiting and diarrhea   . Status post thoracentesis   . Palliative care by specialist   . Community acquired pneumonia of  left lung   . Acute on chronic respiratory failure with hypoxia ([place]) 12/13/2018  . Sepsis due to undetermined organism ([place]) 12/13/2018  . Pleural effusion on left   . HCAP (healthcare-associated pneumonia) 12/12/2018  . Febrile neutropenia ([place]) 12/12/2018  . Hypokalemia 12/12/2018  . Hyponatremia 12/12/2018  . Severe protein-calorie malnutrition ([place]) 12/12/2018  . Sepsis ([place]) 12/12/2018  . Obstructive jaundice 09/21/2018  . Goals of care, counseling/discussion 09/17/2018  . Abdominal pain 09/09/2018  . RUQ pain 07/08/2018  . Metastatic breast cancer ([place]) 07/02/2018  . Liver mass 06/27/2018  . GERD (gastroesophageal reflux disease) 04/02/2018  . Rectal bleeding 04/02/2018  . Constipation 04/02/2018  . Acquired absence of left breast  08/01/2017  . OAB (overactive bladder) 07/31/2017  . Vitamin D deficiency 02/26/2017  . Prediabetes 02/26/2017  . HLD (hyperlipidemia) 02/26/2017  . Asthma 02/12/2017  . Depression 02/12/2017  . Migraines 02/12/2017  . Arthralgia 02/12/2017  . Sleep-disordered breathing 02/12/2017  . Positive ANA (antinuclear antibody) 02/12/2017  . Angular cheilitis 02/12/2017  . Class 2 obesity due to excess calories without serious comorbidity with body mass index (BMI) of 35.0 to 35.9 in adult 02/12/2017  . Malignant neoplasm of central portion of left breast in female, estrogen receptor positive [facility]) 01/05/2017    Rayetta Humphrey, PT CLT [phone] 03/04/2019, 8:25 AM  [place] [address] [place], [place], [address] Phone: [phone]   Fax:  [phone]  Name: ALLECIA KOESTERS MRN: [id number] Date of Birth: 31-Mar-1971

## 2019-03-05 ENCOUNTER — Ambulatory Visit (HOSPITAL_COMMUNITY): Payer: 59 | Admitting: Physical Therapy

## 2019-03-05 ENCOUNTER — Other Ambulatory Visit: Payer: Self-pay

## 2019-03-05 ENCOUNTER — Encounter (HOSPITAL_COMMUNITY): Payer: Self-pay | Admitting: Physical Therapy

## 2019-03-05 DIAGNOSIS — R2689 Other abnormalities of gait and mobility: Secondary | ICD-10-CM

## 2019-03-05 DIAGNOSIS — M6281 Muscle weakness (generalized): Secondary | ICD-10-CM

## 2019-03-05 NOTE — Therapy (Signed)
[place], [place], [address] Phone: [phone]   Fax:  [phone]  Physical Therapy Treatment  Patient Details  Name: Pamela Long MRN: [id number] Date of Birth: 10/13/1970 Referring Provider (PT): Derek Jack   Encounter Date: 03/05/2019  PT End of Session - 03/05/19 1141    Visit Number  2    Number of Visits  18    Date for PT Re-Evaluation  04/14/19    PT Start Time  1050    PT Stop Time  1130    PT Time Calculation (min)  40 min    Equipment Utilized During Treatment  Gait belt    Activity Tolerance  Patient limited by fatigue    Behavior During Therapy  [facility] for tasks assessed/performed       Past Medical History:  Diagnosis Date  . Anemia   . Anxiety   . Asthma   . Breast cancer ([place])    Left, 2017  . Complication of anesthesia   . Depression   . GERD (gastroesophageal reflux disease)   . Migraine   . OAB (overactive bladder)   . PONV (postoperative nausea and vomiting)   . Seasonal allergies     Past Surgical History:  Procedure Laterality Date  . ABDOMINAL HYSTERECTOMY     breast cancer  . ANKLE RECONSTRUCTION Right 1989  . BILIARY STENT PLACEMENT N/A 09/22/2018   Procedure: BILIARY STENT PLACEMENT;  Surgeon: Rogene Houston, MD;  [facility];  Service: Endoscopy;  Laterality: N/A;  . BREAST SURGERY    . COLONOSCOPY WITH PROPOFOL N/A 08/06/2018   Dr. Oneida Alar: small internal hemorrhoids, moderate external hemorrhoids. rectal bleeding due to ?anal fissure vs hemorrhoids. nex tcs 10 years  . ERCP N/A 09/22/2018   Procedure: ENDOSCOPIC RETROGRADE CHOLANGIOPANCREATOGRAPHY (ERCP);  Surgeon: Rogene Houston, MD;  Location: [facility];  Service: Endoscopy;  Laterality: N/A;  . FOREIGN BODY REMOVAL N/A 08/29/2017   from lip  . KNEE SURGERY Right 1989   bone spur  . MASTECTOMY  2017   bil mastectomies  . PORTACATH PLACEMENT Left 12/23/2018   Procedure: INSERTION  PORT-A-CATH LEFT SUBCLAVIAN;  Surgeon: Aviva Signs, MD;  Location: [facility] ORS;  Service: General;  Laterality: Left;  . SINUSOTOMY      There were no vitals filed for this visit.  Subjective Assessment - 03/05/19 1053    Subjective  PT has not had a chance to do the exercise  at this time.    Pertinent History  breast cancer 2017; DX with metastatic breast cancer in Jan 2020    How long can you sit comfortably?  no problem    How long can you stand comfortably?  varies but 10 minutes max.    How long can you walk comfortably?  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    Currently in Pain?  No/denies    Pain Onset  More than a month ago                       [facility] Adult PT Treatment/Exercise - 03/05/19 0001      Exercises   Exercises  Lumbar;Knee/Hip      Lumbar Exercises: Stretches   Other Lumbar Stretch Exercise  3 D  hip excursion       Lumbar Exercises: Machines for Strengthening   Leg Press  3 Pl x 10       Lumbar Exercises: Standing   Functional Squats  10 reps    Scapular Retraction  Strengthening;Both;10 reps;Theraband    Theraband Level (Scapular Retraction)  Level 3 (Green)    Row  Strengthening;Both;10 reps;Theraband    Shoulder Extension  Strengthening;Both;10 reps    Theraband Level (Shoulder Extension)  Level 3 (Green)    Other Standing Lumbar Exercises  PNF D1,D2 x 10 green tband       Lumbar Exercises: Seated   Sit to Stand  10 reps          Balance Exercises - 03/05/19 1104      Balance Exercises: Standing   Tandem Stance  Eyes open;2 reps    Heel Raises Limitations  10    Toe Raise Limitations  10   sitting as unable to do standing.  Rt/LT then  both          PT Short Term Goals - 03/05/19 1120      PT SHORT TERM GOAL #1   Title  PT core and LE strength to be increased by one grade to  allow pt to be able to complete her housework without having to rest.    Time  3    Period  Weeks    Status  On-going    [place] Date  03/24/19      PT SHORT TERM GOAL #2   Title  PT to be able to walk for 15 minutes at a time to be able to complete short shopping trips with confidence.    Time  3    Period  Weeks    Status  On-going      PT SHORT TERM GOAL #3   Title  PT to be able to single leg stance for 30 seconds on both LE to reduce risk of falling.    Time  3    Period  Weeks    Status  On-going        PT Long Term Goals - 03/05/19 1121      PT LONG TERM GOAL #1   Title  PT core and LE strength to be increased by one grade to allow pt to carry groceries up her steps into her house without difficulty.    Time  6    Period  Weeks    Status  On-going      PT LONG TERM GOAL #2   Title  PT to be able to single leg stance for at least 45 seconds bilaterally to be able to be confident walking on uneven terrain.    Time  6    Period  Weeks    Status  On-going      PT LONG TERM GOAL #3   Title  P  pain to be no greater than 3/10 to be able to complete her normal activities for daily living in greater comfort.    Time  6    Period  Weeks    Status  On-going            Plan - 03/05/19 1141    Clinical Impression Statement  Evaluation and goals reviewed with pt. PT fatigued quickly needing four rest periods throughout treatment, however, rest periods were one minute or less.  Noted difficulty during tandem stance activity.    Personal Factors and Comorbidities  Comorbidity 1    Comorbidities  metastatic cancer    Examination-Activity Limitations  Carry;Lift;Locomotion Level;Squat;Stairs;Stand    Examination-Participation Restrictions  Cleaning;Community Activity;Laundry;Meal Prep;Shop;Other;Yard Work    Merchant navy officer  Evolving/Moderate complexity    Rehab Potential  Good    PT Frequency  3x / week    PT Duration  6 weeks    PT  Treatment/Interventions  ADLs/Self Care Home Management;Gait training;Stair training;Therapeutic activities;Therapeutic exercise;Balance training;Patient/family education    PT Next Visit Plan  progress balance , stretching and core and LE strengthening.   PT may benefit from thoracic excursions.    PT Home Exercise Plan  sit to stand, heel raises, single leg stance and walking program.       Patient will benefit from skilled therapeutic intervention in order to improve the following deficits and impairments:  Abnormal gait, Cardiopulmonary status limiting activity, Decreased endurance, Decreased activity tolerance, Decreased balance, Decreased strength, Difficulty walking, Pain  Visit Diagnosis: Other abnormalities of gait and mobility  Muscle weakness (generalized)     Problem List Patient Active Problem List   Diagnosis Date Noted  . Colitis 01/28/2019  . Chemotherapy-induced neutropenia ([place])   . Dehydration   . Lactic acid acidosis   . Nausea vomiting and diarrhea   . Status post thoracentesis   . Palliative care by specialist   . Community acquired pneumonia of left lung   . Acute on chronic respiratory failure with hypoxia ([place]) 12/13/2018  . Sepsis due to undetermined organism ([place]) 12/13/2018  . Pleural effusion on left   . HCAP (healthcare-associated pneumonia) 12/12/2018  . Febrile neutropenia ([place]) 12/12/2018  . Hypokalemia 12/12/2018  . Hyponatremia 12/12/2018  . Severe protein-calorie malnutrition ([place]) 12/12/2018  . Sepsis ([place]) 12/12/2018  . Obstructive jaundice 09/21/2018  . Goals of care, counseling/discussion 09/17/2018  . Abdominal pain 09/09/2018  . RUQ pain 07/08/2018  . Metastatic breast cancer ([place]) 07/02/2018  . Liver mass 06/27/2018  . GERD (gastroesophageal reflux disease) 04/02/2018  . Rectal bleeding 04/02/2018  . Constipation 04/02/2018  . Acquired absence of left breast 08/01/2017  . OAB (overactive bladder) 07/31/2017  . Vitamin D  deficiency 02/26/2017  . Prediabetes 02/26/2017  . HLD (hyperlipidemia) 02/26/2017  . Asthma 02/12/2017  . Depression 02/12/2017  . Migraines 02/12/2017  . Arthralgia 02/12/2017  . Sleep-disordered breathing 02/12/2017  . Positive ANA (antinuclear antibody) 02/12/2017  . Angular cheilitis 02/12/2017  . Class 2 obesity due to excess calories without serious comorbidity with body mass index (BMI) of 35.0 to 35.9 in adult 02/12/2017  . 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) 01/05/2017  Rayetta Humphrey, PT CLT [phone] 03/05/2019, 11:44 AM  [place] [address] [place], [place], [address] Phone: [phone]   Fax:  [phone]  Name: Pamela Long MRN: [id number] Date of Birth: 1971/04/26

## 2019-03-08 ENCOUNTER — Telehealth (HOSPITAL_COMMUNITY): Payer: Self-pay | Admitting: Internal Medicine

## 2019-03-08 ENCOUNTER — Ambulatory Visit (HOSPITAL_COMMUNITY): Payer: 59 | Admitting: Physical Therapy

## 2019-03-08 NOTE — Telephone Encounter (Signed)
03/08/19  pt called to cx said that she woke up sick this morning

## 2019-03-10 ENCOUNTER — Other Ambulatory Visit: Payer: Self-pay

## 2019-03-10 ENCOUNTER — Encounter (HOSPITAL_COMMUNITY): Payer: Self-pay | Admitting: Physical Therapy

## 2019-03-10 ENCOUNTER — Ambulatory Visit (HOSPITAL_COMMUNITY): Payer: 59 | Admitting: Physical Therapy

## 2019-03-10 DIAGNOSIS — R2689 Other abnormalities of gait and mobility: Secondary | ICD-10-CM | POA: Diagnosis not present

## 2019-03-10 DIAGNOSIS — M6281 Muscle weakness (generalized): Secondary | ICD-10-CM

## 2019-03-10 NOTE — Therapy (Signed)
[place], [place], [address] Phone: [phone]   Fax:  [phone]  Physical Therapy Treatment  Patient Details  Name: Pamela Long MRN: [id number] Date of Birth: 10-May-1971 Referring Provider (PT): Derek Jack   Encounter Date: 03/10/2019  PT End of Session - 03/10/19 1354    Visit Number  3    Number of Visits  18    Date for PT Re-Evaluation  04/14/19    PT Start Time  [id number]    PT Stop Time  1353    PT Time Calculation (min)  40 min    Equipment Utilized During Treatment  Gait belt    Activity Tolerance  Patient limited by fatigue    Behavior During Therapy  [facility] for tasks assessed/performed       Past Medical History:  Diagnosis Date  . Anemia   . Anxiety   . Asthma   . Breast cancer ([place])    Left, 2017  . Complication of anesthesia   . Depression   . GERD (gastroesophageal reflux disease)   . Migraine   . OAB (overactive bladder)   . PONV (postoperative nausea and vomiting)   . Seasonal allergies     Past Surgical History:  Procedure Laterality Date  . ABDOMINAL HYSTERECTOMY     breast cancer  . ANKLE RECONSTRUCTION Right 1989  . BILIARY STENT PLACEMENT N/A 09/22/2018   Procedure: BILIARY STENT PLACEMENT;  Surgeon: Rogene Houston, MD;  Location[facility];  Service: Endoscopy;  Laterality: N/A;  . BREAST SURGERY    . COLONOSCOPY WITH PROPOFOL N/A 08/06/2018   Dr. Oneida Alar: small internal hemorrhoids, moderate external hemorrhoids. rectal bleeding due to ?anal fissure vs hemorrhoids. nex tcs 10 years  . ERCP N/A 09/22/2018   Procedure: ENDOSCOPIC RETROGRADE CHOLANGIOPANCREATOGRAPHY (ERCP);  Surgeon: Rogene Houston, MD;  Location: [facility] ENDO SU[facility];  Service: Endoscopy;  Laterality: N/A;  . FOREIGN BODY REMOVAL N/A 08/29/2017   from lip  . KNEE SURGERY Right 1989   bone spur  . MASTECTOMY  2017   bil mastectomies  . PORTACATH PLACEMENT Left 12/23/2018   Procedure: INSERTION  PORT-A-CATH LEFT SUBCLAVIAN;  Surgeon: Aviva Signs, MD;  Location: [facility] ORS;  Service: General;  Laterality: Left;  . SINUSOTOMY      There were no vitals filed for this visit.  Subjective Assessment - 03/10/19 1312    Subjective  Pt has been sick all week, she has not done her exercises.    Pertinent History  breast cancer 2017; DX with metastatic breast cancer in Jan 2020    How long can you sit comfortably?  no problem    How long can you stand comfortably?  varies but 10 minutes max.    How long can you walk comfortably?  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    Currently in Pain?  Yes    Pain Score  3     Pain Location  Abdomen    Pain Orientation  Anterior    Pain Onset  More than a month ago          [facility] Adult PT Treatment/Exercise - 03/10/19 0001      Exercises   Exercises  Lumbar;Knee/Hip      Lumbar Exercises: Stretches  Other Lumbar Stretch Exercise  3 D hip excursion     Other Lumbar Stretch Exercise  thoracic excursions x 3       Lumbar Exercises: Machines for Strengthening   Leg Press  3 Pl x 10       Lumbar Exercises: Standing   Heel Raises  10 reps    Functional Squats  10 reps    Forward Lunge  5 reps    Scapular Retraction  Strengthening;Both;10 reps;Theraband    Theraband Level (Scapular Retraction)  Level 3 (Green)    Row  Strengthening;Both;10 reps;Theraband    Shoulder Extension  Strengthening;Both;10 reps    Theraband Level (Shoulder Extension)  Level 3 (Green)    Other Standing Lumbar Exercises  PNF D1,D2 x 10 green tband     Other Standing Lumbar Exercises  Palof x 10 narrow base of support       Lumbar Exercises: Seated   Sit to Stand  10 reps      Knee/Hip Exercises: Standing   Heel Raises  10 reps    Functional Squat  10 reps    SLS  x2 B          Balance Exercises - 03/10/19 1353       Balance Exercises: Standing   Tandem Gait  Forward   1/2 rep unable to complete 1 rep          PT Short Term Goals - 03/05/19 1120      PT SHORT TERM GOAL #1   Title  PT core and LE strength to be increased by one grade to allow pt to be able to complete her housework without having to rest.    Time  3    Period  Weeks    Status  On-going    [employer] Date  03/24/19      PT SHORT TERM GOAL #2   Title  PT to be able to walk for 15 minutes at a time to be able to complete short shopping trips with confidence.    Time  3    Period  Weeks    Status  On-going      PT SHORT TERM GOAL #3   Title  PT to be able to single leg stance for 30 seconds on both LE to reduce risk of falling.    Time  3    Period  Weeks    Status  On-going        PT Long Term Goals - 03/05/19 1121      PT LONG TERM GOAL #1   Title  PT core and LE strength to be increased by one grade to allow pt to carry groceries up her steps into her house without difficulty.    Time  6    Period  Weeks    Status  On-going      PT LONG TERM GOAL #2   Title  PT to be able to single leg stance for at least 45 seconds bilaterally to be able to be confident walking on uneven terrain.    Time  6    Period  Weeks    Status  On-going      PT LONG TERM GOAL #3   Title  P  pain to be no greater than 3/10 to be able to complete her normal activities for daily living in greater comfort.    Time  6    Period  Weeks    Status  On-going            Plan - 03/10/19 1355    Clinical Impression Statement  Pt has not completed HEP yet at this point.  Therapist encouraged pt to at least walk.  Added tandem gt which was difficult for pt to complete. Added Pallof to improve core strength.  Overall pt is doing well.    Personal Factors and Comorbidities  Comorbidity 1    Comorbidities  metastatic cancer    Examination-Activity Limitations  Carry;Lift;Locomotion Level;Squat;Stairs;Stand    Examination-Participation  Restrictions  Cleaning;Community Activity;Laundry;Meal Prep;Shop;Other;Yard Work    [employer] officer  Evolving/Moderate complexity    Rehab Potential  Good    PT Frequency  3x / week    PT Duration  6 weeks    PT Treatment/Interventions  ADLs/Self Care Home Management;Gait training;Stair training;Therapeutic activities;Therapeutic exercise;Balance training;Patient/family education    PT Next Visit Plan  Hold new activity until pt only needs 2 rest periods per session.    PT Home Exercise Plan  sit to stand, heel raises, single leg stance and walking program.       Patient will benefit from skilled therapeutic intervention in order to improve the following deficits and impairments:  Abnormal gait, Cardiopulmonary status limiting activity, Decreased endurance, Decreased activity tolerance, Decreased balance, Decreased strength, Difficulty walking, Pain  Visit Diagnosis: Other abnormalities of gait and mobility  Muscle weakness (generalized)     Problem List Patient Active Problem List   Diagnosis Date Noted  . Colitis 01/28/2019  . Chemotherapy-induced neutropenia ([place])   . Dehydration   . Lactic acid acidosis   . Nausea vomiting and diarrhea   . Status post thoracentesis   . Palliative care by specialist   . Community acquired pneumonia of left lung   . Acute on chronic respiratory failure with hypoxia ([place]) 12/13/2018  . Sepsis due to undetermined organism ([place]) 12/13/2018  . Pleural effusion on left   . HCAP (healthcare-associated pneumonia) 12/12/2018  . Febrile neutropenia ([place]) 12/12/2018  . Hypokalemia 12/12/2018  . Hyponatremia 12/12/2018  . Severe protein-calorie malnutrition ([place]) 12/12/2018  . Sepsis ([place]) 12/12/2018  . Obstructive jaundice 09/21/2018  . Goals of care, counseling/discussion 09/17/2018  . Abdominal pain 09/09/2018  . RUQ pain 07/08/2018  . Metastatic breast cancer ([place]) 07/02/2018  . Liver mass 06/27/2018  . GERD  (gastroesophageal reflux disease) 04/02/2018  . Rectal bleeding 04/02/2018  . Constipation 04/02/2018  . Acquired absence of left breast 08/01/2017  . OAB (overactive bladder) 07/31/2017  . Vitamin D deficiency 02/26/2017  . Prediabetes 02/26/2017  . HLD (hyperlipidemia) 02/26/2017  . Asthma 02/12/2017  . Depression 02/12/2017  . Migraines 02/12/2017  . Arthralgia 02/12/2017  . Sleep-disordered breathing 02/12/2017  . Positive ANA (antinuclear antibody) 02/12/2017  . Angular cheilitis 02/12/2017  . Class 2 obesity due to excess calories without serious comorbidity with body mass index (BMI) of 35.0 to 35.9 in adult 02/12/2017  . Malignant neoplasm of central portion of left breast in female, estrogen receptor positive [facility]) 01/05/2017    Rayetta Humphrey, PT CLT [phone] 03/10/2019, 1:59 PM  [place] [address] [place], [place], [address] Phone: [phone]   Fax:  [phone]  Name: Pamela Long MRN: [id number] Date of Birth: 06-18-1970

## 2019-03-11 ENCOUNTER — Inpatient Hospital Stay (HOSPITAL_COMMUNITY): Payer: 59 | Attending: Hematology

## 2019-03-11 ENCOUNTER — Other Ambulatory Visit (INDEPENDENT_AMBULATORY_CARE_PROVIDER_SITE_OTHER): Payer: Self-pay | Admitting: Internal Medicine

## 2019-03-11 ENCOUNTER — Inpatient Hospital Stay (HOSPITAL_BASED_OUTPATIENT_CLINIC_OR_DEPARTMENT_OTHER): Payer: 59 | Admitting: Hematology

## 2019-03-11 ENCOUNTER — Inpatient Hospital Stay (HOSPITAL_COMMUNITY): Payer: 59

## 2019-03-11 VITALS — BP 120/73 | HR 109 | Temp 97.0°F | Resp 18 | Wt 177.0 lb

## 2019-03-11 DIAGNOSIS — G620 Drug-induced polyneuropathy: Secondary | ICD-10-CM | POA: Insufficient documentation

## 2019-03-11 DIAGNOSIS — C50112 Malignant neoplasm of central portion of left female breast: Secondary | ICD-10-CM | POA: Diagnosis not present

## 2019-03-11 DIAGNOSIS — Z23 Encounter for immunization: Secondary | ICD-10-CM | POA: Insufficient documentation

## 2019-03-11 DIAGNOSIS — C50919 Malignant neoplasm of unspecified site of unspecified female breast: Secondary | ICD-10-CM

## 2019-03-11 DIAGNOSIS — Z9013 Acquired absence of bilateral breasts and nipples: Secondary | ICD-10-CM | POA: Insufficient documentation

## 2019-03-11 DIAGNOSIS — R1011 Right upper quadrant pain: Secondary | ICD-10-CM | POA: Insufficient documentation

## 2019-03-11 DIAGNOSIS — Z5111 Encounter for antineoplastic chemotherapy: Secondary | ICD-10-CM | POA: Insufficient documentation

## 2019-03-11 DIAGNOSIS — Z801 Family history of malignant neoplasm of trachea, bronchus and lung: Secondary | ICD-10-CM | POA: Diagnosis not present

## 2019-03-11 DIAGNOSIS — K831 Obstruction of bile duct: Secondary | ICD-10-CM

## 2019-03-11 DIAGNOSIS — Z170 Estrogen receptor positive status [ER+]: Secondary | ICD-10-CM | POA: Insufficient documentation

## 2019-03-11 DIAGNOSIS — R180 Malignant ascites: Secondary | ICD-10-CM | POA: Insufficient documentation

## 2019-03-11 DIAGNOSIS — Z79899 Other long term (current) drug therapy: Secondary | ICD-10-CM | POA: Diagnosis not present

## 2019-03-11 DIAGNOSIS — R112 Nausea with vomiting, unspecified: Secondary | ICD-10-CM | POA: Insufficient documentation

## 2019-03-11 DIAGNOSIS — C787 Secondary malignant neoplasm of liver and intrahepatic bile duct: Secondary | ICD-10-CM | POA: Insufficient documentation

## 2019-03-11 DIAGNOSIS — R10816 Epigastric abdominal tenderness: Secondary | ICD-10-CM | POA: Diagnosis not present

## 2019-03-11 DIAGNOSIS — K5900 Constipation, unspecified: Secondary | ICD-10-CM | POA: Diagnosis not present

## 2019-03-11 DIAGNOSIS — Z0000 Encounter for general adult medical examination without abnormal findings: Secondary | ICD-10-CM

## 2019-03-11 DIAGNOSIS — Z803 Family history of malignant neoplasm of breast: Secondary | ICD-10-CM | POA: Diagnosis not present

## 2019-03-11 DIAGNOSIS — E876 Hypokalemia: Secondary | ICD-10-CM | POA: Insufficient documentation

## 2019-03-11 LAB — CBC WITH DIFFERENTIAL/PLATELET
Abs Immature Granulocytes: 0 10*3/uL (ref 0.00–0.07)
Basophils Absolute: 0 10*3/uL (ref 0.0–0.1)
Basophils Relative: 2 %
Eosinophils Absolute: 0.1 10*3/uL (ref 0.0–0.5)
Eosinophils Relative: 5 %
HCT: 31.5 % — ABNORMAL LOW (ref 36.0–46.0)
Hemoglobin: 9.5 g/dL — ABNORMAL LOW (ref 12.0–15.0)
Immature Granulocytes: 0 %
Lymphocytes Relative: 26 %
Lymphs Abs: 0.7 10*3/uL (ref 0.7–4.0)
MCH: 25.8 pg — ABNORMAL LOW (ref 26.0–34.0)
MCHC: 30.2 g/dL (ref 30.0–36.0)
MCV: 85.6 fL (ref 80.0–100.0)
Monocytes Absolute: 0.6 10*3/uL (ref 0.1–1.0)
Monocytes Relative: 22 %
Neutro Abs: 1.2 10*3/uL — ABNORMAL LOW (ref 1.7–7.7)
Neutrophils Relative %: 45 %
Platelets: 183 10*3/uL (ref 150–400)
RBC: 3.68 MIL/uL — ABNORMAL LOW (ref 3.87–5.11)
RDW: 19.5 % — ABNORMAL HIGH (ref 11.5–15.5)
WBC: 2.6 10*3/uL — ABNORMAL LOW (ref 4.0–10.5)
nRBC: 0 % (ref 0.0–0.2)

## 2019-03-11 LAB — COMPREHENSIVE METABOLIC PANEL
ALT: 27 U/L (ref 0–44)
AST: 56 U/L — ABNORMAL HIGH (ref 15–41)
Albumin: 2.7 g/dL — ABNORMAL LOW (ref 3.5–5.0)
Alkaline Phosphatase: 185 U/L — ABNORMAL HIGH (ref 38–126)
Anion gap: 8 (ref 5–15)
BUN: 11 mg/dL (ref 6–20)
CO2: 26 mmol/L (ref 22–32)
Calcium: 8.7 mg/dL — ABNORMAL LOW (ref 8.9–10.3)
Chloride: 105 mmol/L (ref 98–111)
Creatinine, Ser: 0.46 mg/dL (ref 0.44–1.00)
GFR calc Af Amer: >60 mL/min (ref >60)
GFR calc non Af Amer: >60 mL/min (ref >60)
Glucose, Bld: 114 mg/dL — ABNORMAL HIGH (ref 70–99)
Potassium: 3.2 mmol/L — ABNORMAL LOW (ref 3.5–5.1)
Sodium: 139 mmol/L (ref 135–145)
Total Bilirubin: 1 mg/dL (ref 0.3–1.2)
Total Protein: 6 g/dL — ABNORMAL LOW (ref 6.5–8.1)

## 2019-03-11 MED ORDER — FILGRASTIM-SNDZ 300 MCG/0.5ML IJ SOSY
300.0000 ug | PREFILLED_SYRINGE | Freq: Once | INTRAMUSCULAR | Status: AC
  Administered 2019-03-11: 300 ug via SUBCUTANEOUS
  Filled 2019-03-11: qty 0.5

## 2019-03-11 MED ORDER — HEPARIN SOD (PORK) LOCK FLUSH 100 UNIT/ML IV SOLN
500.0000 [IU] | Freq: Once | INTRAVENOUS | Status: AC
  Administered 2019-03-11: 500 [IU] via INTRAVENOUS

## 2019-03-11 MED ORDER — INFLUENZA VAC A&B SA ADJ QUAD 0.5 ML IM PRSY: 0.5000 mL | PREFILLED_SYRINGE | Freq: Once | INTRAMUSCULAR | Status: DC

## 2019-03-11 MED ORDER — INFLUENZA VAC SPLIT QUAD 0.5 ML IM SUSY
0.5000 mL | PREFILLED_SYRINGE | Freq: Once | INTRAMUSCULAR | Status: AC
  Administered 2019-03-11: 0.5 mL via INTRAMUSCULAR

## 2019-03-11 MED ORDER — SODIUM CHLORIDE 0.9% FLUSH
20.0000 mL | INTRAVENOUS | Status: DC | PRN
  Administered 2019-03-11: 12:00:00 20 mL via INTRAVENOUS
  Filled 2019-03-11: qty 20

## 2019-03-11 MED ORDER — INFLUENZA VAC SPLIT QUAD 0.5 ML IM SUSY
PREFILLED_SYRINGE | INTRAMUSCULAR | Status: AC
  Filled 2019-03-11: qty 0.5

## 2019-03-11 NOTE — Progress Notes (Signed)
Pamela Long, Pamela Long [id number]   CLINIC:  Medical Oncology/Hematology  PCP:  Doree Albee, MD [place] [address] [phone]   REASON FOR VISIT:  Follow-up for metastatic breast cancer.   BRIEF ONCOLOGIC HISTORY:  Oncology History  Metastatic breast cancer ([place])  07/02/2018 Initial Diagnosis   Metastatic breast cancer ([place])   09/18/2018 -  Chemotherapy   The patient had palonosetron (ALOXI) injection 0.25 mg, 0.25 mg, Intravenous,  Once, 4 of 5 cycles Administration: 0.25 mg (10/01/2018), 0.25 mg (10/09/2018), 0.25 mg (10/23/2018), 0.25 mg (10/30/2018), 0.25 mg (11/12/2018), 0.25 mg (12/03/2018), 0.25 mg (12/24/2018), 0.25 mg (01/22/2019), 0.25 mg (01/07/2019), 0.25 mg (02/04/2019), 0.25 mg (02/25/2019) PACLitaxel (TAXOL) 120 mg in sodium chloride 0.9 % 250 mL chemo infusion (</= '80mg'$ /m2), 60 mg/m2 = 120 mg (100 % of original dose 60 mg/m2), Intravenous,  Once, 4 of 5 cycles Dose modification: 60 mg/m2 (original dose 60 mg/m2, Cycle 1, Reason: Change in LFTs), 80 mg/m2 (original dose 60 mg/m2, Cycle 2, Reason: Provider Judgment), 54 mg/m2 (90 % of original dose 60 mg/m2, Cycle 3, Reason: Other (see comments), Comment: hepatic dysfunction), 40 mg/m2 (66.7 % of original dose 60 mg/m2, Cycle 4, Reason: Other (see comments), Comment: neutropenia) Administration: 120 mg (09/18/2018), 120 mg (10/01/2018), 120 mg (10/23/2018), 120 mg (10/09/2018), 120 mg (10/30/2018), 162 mg (11/12/2018), 162 mg (12/03/2018), 108 mg (12/24/2018), 108 mg (01/22/2019), 108 mg (01/07/2019), 78 mg (02/04/2019), 78 mg (02/25/2019)  for chemotherapy treatment.       CANCER STAGING: Cancer Staging No matching staging information was found for the patient.   INTERVAL HISTORY:  Pamela Long 48 y.o. female seen for follow-up of metastatic breast cancer to the liver.  Last treatment was on 02/25/2019.  She reports intermittent right upper quadrant pain lasting less than 5 minutes.   Appetite is 50%.  She is not totally compliant with potassium pills.  Energy levels are 75%.  Tingling and numbness in the extremities is well controlled with gabapentin.  Shortness of breath on exertion is stable.  REVIEW OF SYSTEMS:  Review of Systems  Respiratory: Positive for shortness of breath.   Gastrointestinal: Positive for nausea.  Neurological: Positive for numbness.  Psychiatric/Behavioral: Positive for sleep disturbance.  All other systems reviewed and are negative.    PAST MEDICAL/SURGICAL HISTORY:  Past Medical History:  Diagnosis Date  . Anemia   . Anxiety   . Asthma   . Breast cancer ([place])    Left, 2017  . Complication of anesthesia   . Depression   . GERD (gastroesophageal reflux disease)   . Migraine   . OAB (overactive bladder)   . PONV (postoperative nausea and vomiting)   . Seasonal allergies    Past Surgical History:  Procedure Laterality Date  . ABDOMINAL HYSTERECTOMY     breast cancer  . ANKLE RECONSTRUCTION Right 1989  . BILIARY STENT PLACEMENT N/A 09/22/2018   Procedure: BILIARY STENT PLACEMENT;  Surgeon: Rogene Houston, MD;  Location[facility];  Service: Endoscopy;  Laterality: N/A;  . BILIARY STENT PLACEMENT N/A 03/16/2019   Procedure: BILIARY STENT EXCHANGE;  Surgeon: Rogene Houston, MD;  Location: [facility];  Service: Endoscopy;  Laterality: N/A;  . BREAST SURGERY    . COLONOSCOPY WITH PROPOFOL N/A 08/06/2018   Dr. Oneida Alar: small internal hemorrhoids, moderate external hemorrhoids. rectal bleeding due to ?anal fissure vs hemorrhoids. nex tcs 10 years  . ERCP N/A 09/22/2018  Procedure: ENDOSCOPIC RETROGRADE CHOLANGIOPANCREATOGRAPHY (ERCP);  Surgeon: Rogene Houston, MD;  Location: [facility];  Service: Endoscopy;  Laterality: N/A;  . ERCP N/A 03/16/2019   Procedure: ENDOSCOPIC RETROGRADE CHOLANGIOPANCREATOGRAPHY (ERCP);  Surgeon: Rogene Houston, MD;  Location: [facility];  Service: Endoscopy;  Laterality: N/A;  . FOREIGN BODY  REMOVAL N/A 08/29/2017   from lip  . KNEE SURGERY Right 1989   bone spur  . MASTECTOMY  2017   bil mastectomies  . PORTACATH PLACEMENT Left 12/23/2018   Procedure: INSERTION PORT-A-CATH LEFT SUBCLAVIAN;  Surgeon: Aviva Signs, MD;  Location: [facility] ORS;  Service: General;  Laterality: Left;  . SINUSOTOMY       SOCIAL HISTORY:  Social History   Socioeconomic History  . Marital status: Single    Spouse name: Not on file  . Number of children: 1  . Years of education: 29  . Highest education level: Not on file  Occupational History  . Occupation: disabled  Social Needs  . Financial resource strain: Somewhat hard  . Food insecurity    Worry: Sometimes true    Inability: Sometimes true  . Transportation needs    Medical: No    Non-medical: No  Tobacco Use  . Smoking status: Never Smoker  . Smokeless tobacco: Never Used  Substance and Sexual Activity  . Alcohol use: No  . Drug use: No  . Sexual activity: Not Currently  Lifestyle  . Physical activity    Days per week: 0 days    Minutes per session: 0 min  . Stress: Rather much  Relationships  . Social Herbalist on phone: Once a week    Gets together: Once a week    Attends religious service: 1 to 4 times per year    Active member of club or organization: Yes    Attends meetings of clubs or organizations: Never    Relationship status: Never married  . Intimate partner violence    Fear of current or ex partner: No    Emotionally abused: No    Physically abused: No    Forced sexual activity: No  Other Topics Concern  . Not on file  Social History Narrative   Bachelors degree   Accounting   Lives with daughter [name] who has autism and diabetes   Likes to sew, quilt, crafts, crochet    FAMILY HISTORY:  Family History  Problem Relation Age of Onset  . Arthritis Mother   . COPD Mother   . Depression Mother   . Diabetes Mother   . Kidney disease Father   . Heart disease Father 92  . Drug abuse Father    . Hypertension Father   . Diabetes Daughter   . Hashimoto's thyroiditis Daughter   . Irritable bowel syndrome Daughter   . Autism spectrum disorder Daughter   . Early death Maternal Grandmother        drowned  . Early death Maternal Grandfather   . Heart disease Maternal Grandfather   . Heart disease Paternal Grandfather   . Breast cancer Maternal Aunt   . Breast cancer Cousin   . Lung cancer Maternal Aunt   . Lung cancer Maternal Uncle   . Colon cancer Neg Hx     CURRENT MEDICATIONS:  Outpatient Encounter Medications as of 03/11/2019  Medication Sig  . escitalopram (LEXAPRO) 20 MG tablet Take 1 tablet (20 mg total) by mouth daily.  Marland Kitchen gabapentin (NEURONTIN) 300 MG capsule Take 1 capsule (300  mg total) by mouth at bedtime.  Marland Kitchen lubiprostone (AMITIZA) 8 MCG capsule Take 1 capsule (8 mcg total) by mouth 2 (two) times daily with a meal.  . magnesium oxide (MAG-OX) 400 MG tablet TAKE 1 TABLET BY MOUTH EVERY DAY  . nystatin-triamcinolone (MYCOLOG II) cream Apply 1 application topically 2 (two) times daily.   Marland Kitchen PACLitaxel (TAXOL IV) Inject into the vein every 14 (fourteen) days.   . pantoprazole (PROTONIX) 40 MG tablet TAKE 1 TABLET BY MOUTH EVERY DAY BEFORE BREAKFAST  . temazepam (RESTORIL) 15 MG capsule TAKE 1 CAPSULE (15 MG TOTAL) BY MOUTH AT BEDTIME AS NEEDED FOR SLEEP. (Patient taking differently: Take 15 mg by mouth at bedtime. )  . [DISCONTINUED] furosemide (LASIX) 20 MG tablet Take 20 mg by mouth daily.   . [DISCONTINUED] potassium chloride SA (K-DUR) 20 MEQ tablet Take 2 tablets (40 mEq total) by mouth daily.  . [DISCONTINUED] spironolactone (ALDACTONE) 25 MG tablet TAKE 1 TABLET BY MOUTH EVERY DAY  . magic mouthwash w/lidocaine SOLN Take 5 mLs by mouth 4 (four) times daily as needed for mouth pain. (Patient not taking: Reported on 03/23/2019)  . Polyvinyl Alcohol-Povidone PF (REFRESH) 1.4-0.6 % SOLN Place 1 drop into both eyes as needed (for dry eyes).   . rizatriptan (MAXALT-MLT)  10 MG disintegrating tablet Take 10 mg by mouth every 2 (two) hours as needed for migraine.   Marland Kitchen scopolamine (TRANSDERM-SCOP) 1 MG/3DAYS Place 1 patch (1.5 mg total) onto the skin every 3 (three) days. (Patient not taking: Reported on 03/23/2019)  . [DISCONTINUED] lidocaine-prilocaine (EMLA) cream Apply 1 application topically as needed. (Patient not taking: Reported on 03/11/2019)  . [DISCONTINUED] oxyCODONE-acetaminophen (PERCOCET) 5-325 MG tablet Take 1 tablet by mouth every 4 (four) hours as needed for moderate pain. (Patient not taking: Reported on 03/11/2019)  . [EXPIRED] influenza vac split quadrivalent PF (FLUARIX) injection 0.5 mL   . [DISCONTINUED] influenza vaccine adjuvanted (FLUAD) injection 0.5 mL    No facility-administered encounter medications on file as of 03/11/2019.     ALLERGIES:  Allergies  Allergen Reactions  . Salagen [Pilocarpine] Other (See Comments)    Can cause liver failure   . Tape Itching and Other (See Comments)    Depending on the adhesive-blistering occurs  . Amoxicillin Hives and Other (See Comments)    Rash only DID THE REACTION INVOLVE: Swelling of the face/tongue/throat, SOB, or low BP? Sudden or severe rash/hives, skin peeling, or the inside of the mouth or nose?  Did it require medical treatment?  When did it last happen? If all above answers are "NO", may proceed with cephalosporin use.   . Caffeine Diarrhea, Nausea Only, Palpitations and Other (See Comments)    Headache  . Tetanus Toxoids Swelling and Other (See Comments)    Local reaction     PHYSICAL EXAM:  ECOG Performance status: 1 Blood pressure is 120/73.  Pulse rate is 109.  Respiratory is 18.  Temperature 97.  Saturations are 97%.  Physical Exam Vitals signs reviewed.  Constitutional:      Appearance: Normal appearance.  Cardiovascular:     Rate and Rhythm: Normal rate and regular rhythm.     Heart sounds: Normal heart sounds.  Pulmonary:     Effort: Pulmonary effort is  normal.     Breath sounds: Normal breath sounds.  Abdominal:     General: There is no distension.     Palpations: Abdomen is soft. There is no mass.  Musculoskeletal:  General: No swelling.  Skin:    General: Skin is warm.  Neurological:     General: No focal deficit present.     Mental Status: She is alert and oriented to person, place, and time.  Psychiatric:        Mood and Affect: Mood normal.        Behavior: Behavior normal.      LABORATORY DATA:  I have reviewed the labs as listed.  CBC    Component Value Date/Time   WBC 2.6 (L) 03/11/2019 0843   RBC 3.68 (L) 03/11/2019 0843   HGB 9.5 (L) 03/11/2019 0843   HCT 31.5 (L) 03/11/2019 0843   PLT 183 03/11/2019 0843   MCV 85.6 03/11/2019 0843   MCH 25.8 (L) 03/11/2019 0843   MCHC 30.2 03/11/2019 0843   RDW 19.5 (H) 03/11/2019 0843   LYMPHSABS 0.7 03/11/2019 0843   MONOABS 0.6 03/11/2019 0843   EOSABS 0.1 03/11/2019 0843   BASOSABS 0.0 03/11/2019 0843   CMP Latest Ref Rng & Units 03/11/2019 02/25/2019 02/18/2019  Glucose 70 - 99 mg/dL 114(H) 112(H) 128(H)  BUN 6 - 20 mg/dL '11 9 10  '$ Creatinine 0.44 - 1.00 mg/dL 0.46 0.57 0.53  Sodium 135 - 145 mmol/L 139 137 137  Potassium 3.5 - 5.1 mmol/L 3.2(L) 2.9(L) 3.2(L)  Chloride 98 - 111 mmol/L 105 100 105  CO2 22 - 32 mmol/L '26 29 26  '$ Calcium 8.9 - 10.3 mg/dL 8.7(L) 8.4(L) 8.3(L)  Total Protein 6.5 - 8.1 g/dL 6.0(L) 6.0(L) 5.8(L)  Total Bilirubin 0.3 - 1.2 mg/dL 1.0 0.9 1.1  Alkaline Phos 38 - 126 U/L 185(H) 195(H) 174(H)  AST 15 - 41 U/L 56(H) 54(H) 45(H)  ALT 0 - 44 U/L '27 28 24       '$ DIAGNOSTIC IMAGING:  I have independently reviewed the scans and discussed with the patient.   I have reviewed Venita Lick LPN's note and agree with the documentation.  I personally performed a face-to-face visit, made revisions and my assessment and plan is as follows.    ASSESSMENT & PLAN:   Metastatic breast cancer ([place]) 1.  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 - ERCP and plastic stent in hepatic duct on 09/22/2018 by Dr. Laural Golden. - Paclitaxel 60 mg/m2 started on 09/18/2018.  Last dose was on 02/25/2019. - She does report intermittent right upper quadrant pain on and off, lasting less than 5 minutes. - We reviewed her blood work.  ANC is 1200.  White count is 2.6. -I would hold off on treatment today.  I have made a referral to [facility] for change of her biliary stent. -She will continue physical therapy 3 times per week for 6 weeks.  2.  Hypokalemia/hypomagnesemia: -Potassium is 3.2 today.  She will continue potassium supplements at home.  3.  Ascites: -This is from hepatic metastatic disease. -She will continue Lasix 20 mg daily and spironolactone 25 mg daily. -Last paracentesis on 02/12/2019 with 2.3 L removed.  3.  Neuropathy: -She takes gabapentin 300 mg at bedtime which is helping burning sensation in her feet.  4.  Nausea: -She takes Zofran alternating with Compazine.  Total time spent is 25 minutes with more than 50% of the time spent face-to-face discussing labs, counseling and coordination of care.  Orders placed this encounter:  No orders of the defined types were placed in this encounter.     Derek Jack,  MD [facility] [phone]    

## 2019-03-11 NOTE — Progress Notes (Signed)
1130 Labs reviewed with and pt seen by Dr. [name] and chemo tx to be held today with Zarxio 300 mcg to be given only per MD                                                                     Pamela Long tolerated Zarxio injection well without complaints or incident. Pt discharged self ambulatory in satisfactory condition

## 2019-03-11 NOTE — Patient Instructions (Addendum)
[place] at [facility] Discharge Instructions  Received Zarxio injection today. Follow-up as scheduled. Call clinic for any questions or concerns   Thank you for choosing [place] at [facility] to provide your oncology and hematology care.  To afford each patient quality time with our provider, please arrive at least 15 minutes before your scheduled appointment time.   If you have a lab appointment with the [facility] please come in thru the Main Entrance and check in at the main information desk.  You need to re-schedule your appointment should you arrive 10 or more minutes late.  We strive to give you quality time with our providers, and arriving late affects you and other patients whose appointments are after yours.  Also, if you no show three or more times for appointments you may be dismissed from the clinic at the providers discretion.     Again, thank you for choosing [facility].  Our hope is that these requests will decrease the amount of time that you wait before being seen by our physicians.       _____________________________________________________________  Should you have questions after your visit to [facility], please contact our office at [phone] between the hours of 8:00 a.m. and 4:30 p.m.  Voicemails left after 4:00 p.m. will not be returned until the following business day.  For prescription refill requests, have your pharmacy contact our office and allow 72 hours.    Due to Covid, you will need to wear a mask upon entering the hospital. If you do not have a mask, a mask will be given to you at the Main Entrance upon arrival. For doctor visits, patients may have 1 support person with them. For treatment visits, patients can not have anyone with them due to social distancing guidelines and our immunocompromised population.

## 2019-03-11 NOTE — Patient Instructions (Addendum)
[facility] at [facility] Discharge Instructions  You were seen today by Dr. Katragadda. He went over your recent lab results. He will see you back in 2 weeks for labs and follow up.   Thank you for choosing [facility] at [facility] to provide your oncology and hematology care.  To afford each patient quality time with our provider, please arrive at least 15 minutes before your scheduled appointment time.   If you have a lab appointment with the Cancer Center please come in thru the  Main Entrance and check in at the main information desk  You need to re-schedule your appointment should you arrive 10 or more minutes late.  We strive to give you quality time with our providers, and arriving late affects you and other patients whose appointments are after yours.  Also, if you no show three or more times for appointments you may be dismissed from the clinic at the providers discretion.     Again, thank you for choosing [facility].  Our hope is that these requests will decrease the amount of time that you wait before being seen by our physicians.       _____________________________________________________________  Should you have questions after your visit to [facility], please contact our office at [phone] between the hours of 8:00 a.m. and 4:30 p.m.  Voicemails left after 4:00 p.m. will not be returned until the following business day.  For prescription refill requests, have your pharmacy contact our office and allow 72 hours.    Cancer Center Support Programs:   > Cancer Support Group  2nd Tuesday of the month 1pm-2pm, Journey Room    

## 2019-03-12 ENCOUNTER — Other Ambulatory Visit (HOSPITAL_COMMUNITY): Payer: 59

## 2019-03-12 ENCOUNTER — Encounter (HOSPITAL_COMMUNITY)
Admission: RE | Admit: 2019-03-12 | Discharge: 2019-03-12 | Disposition: A | Discharge status: Home / Self Care | Payer: 59 | Source: Ambulatory Visit | Attending: Internal Medicine | Admitting: Internal Medicine

## 2019-03-12 ENCOUNTER — Other Ambulatory Visit (HOSPITAL_COMMUNITY)
Admission: RE | Admit: 2019-03-12 | Discharge: 2019-03-12 | Disposition: A | Discharge status: Home / Self Care | Payer: 59 | Source: Ambulatory Visit | Attending: Internal Medicine | Admitting: Internal Medicine

## 2019-03-12 ENCOUNTER — Other Ambulatory Visit: Payer: Self-pay

## 2019-03-12 ENCOUNTER — Other Ambulatory Visit (INDEPENDENT_AMBULATORY_CARE_PROVIDER_SITE_OTHER): Payer: Self-pay | Admitting: *Deleted

## 2019-03-12 DIAGNOSIS — K831 Obstruction of bile duct: Secondary | ICD-10-CM | POA: Diagnosis not present

## 2019-03-12 DIAGNOSIS — Z01812 Encounter for preprocedural laboratory examination: Secondary | ICD-10-CM | POA: Diagnosis not present

## 2019-03-12 DIAGNOSIS — Z20828 Contact with and (suspected) exposure to other viral communicable diseases: Secondary | ICD-10-CM | POA: Diagnosis not present

## 2019-03-12 DIAGNOSIS — R1011 Right upper quadrant pain: Secondary | ICD-10-CM | POA: Insufficient documentation

## 2019-03-13 LAB — SARS CORONAVIRUS 2 (TAT 6-24 HRS): SARS Coronavirus 2: NEGATIVE

## 2019-03-15 ENCOUNTER — Ambulatory Visit (HOSPITAL_COMMUNITY): Payer: 59 | Admitting: Physical Therapy

## 2019-03-15 ENCOUNTER — Telehealth (HOSPITAL_COMMUNITY): Payer: Self-pay | Admitting: Internal Medicine

## 2019-03-15 NOTE — Telephone Encounter (Signed)
03/15/19  patient chose to cancel on phone tree and I called her to confirm... she is quarantined for a procedure that she will be having on Tuesda

## 2019-03-16 ENCOUNTER — Encounter (HOSPITAL_COMMUNITY)
Admission: RE | Disposition: A | Discharge status: Home / Self Care | Payer: Self-pay | Source: Home / Self Care | Attending: Internal Medicine

## 2019-03-16 ENCOUNTER — Encounter (HOSPITAL_COMMUNITY): Payer: Self-pay | Admitting: *Deleted

## 2019-03-16 ENCOUNTER — Ambulatory Visit (HOSPITAL_COMMUNITY): Payer: 59

## 2019-03-16 ENCOUNTER — Ambulatory Visit (HOSPITAL_COMMUNITY): Payer: 59 | Admitting: Anesthesiology

## 2019-03-16 ENCOUNTER — Ambulatory Visit (HOSPITAL_COMMUNITY)
Admission: RE | Admit: 2019-03-16 | Discharge: 2019-03-16 | Disposition: A | Discharge status: Home / Self Care | Payer: 59 | Attending: Internal Medicine | Admitting: Internal Medicine

## 2019-03-16 ENCOUNTER — Other Ambulatory Visit (HOSPITAL_COMMUNITY): Payer: Self-pay | Admitting: Hematology

## 2019-03-16 DIAGNOSIS — F419 Anxiety disorder, unspecified: Secondary | ICD-10-CM | POA: Diagnosis not present

## 2019-03-16 DIAGNOSIS — K219 Gastro-esophageal reflux disease without esophagitis: Secondary | ICD-10-CM | POA: Diagnosis not present

## 2019-03-16 DIAGNOSIS — Z853 Personal history of malignant neoplasm of breast: Secondary | ICD-10-CM | POA: Diagnosis not present

## 2019-03-16 DIAGNOSIS — K831 Obstruction of bile duct: Secondary | ICD-10-CM

## 2019-03-16 DIAGNOSIS — J45909 Unspecified asthma, uncomplicated: Secondary | ICD-10-CM | POA: Insufficient documentation

## 2019-03-16 DIAGNOSIS — R1011 Right upper quadrant pain: Secondary | ICD-10-CM | POA: Diagnosis not present

## 2019-03-16 DIAGNOSIS — C787 Secondary malignant neoplasm of liver and intrahepatic bile duct: Secondary | ICD-10-CM | POA: Insufficient documentation

## 2019-03-16 DIAGNOSIS — R188 Other ascites: Secondary | ICD-10-CM

## 2019-03-16 DIAGNOSIS — Z79899 Other long term (current) drug therapy: Secondary | ICD-10-CM | POA: Diagnosis not present

## 2019-03-16 DIAGNOSIS — F329 Major depressive disorder, single episode, unspecified: Secondary | ICD-10-CM | POA: Diagnosis not present

## 2019-03-16 DIAGNOSIS — C50919 Malignant neoplasm of unspecified site of unspecified female breast: Secondary | ICD-10-CM

## 2019-03-16 DIAGNOSIS — K766 Portal hypertension: Secondary | ICD-10-CM | POA: Diagnosis not present

## 2019-03-16 DIAGNOSIS — Z9889 Other specified postprocedural states: Secondary | ICD-10-CM

## 2019-03-16 HISTORY — PX: BILIARY STENT PLACEMENT: SHX5538

## 2019-03-16 HISTORY — PX: ERCP: SHX5425

## 2019-03-16 IMAGING — CR DG ABDOMEN 1V
1 series · 1 of 1 positions shown · non-contrast
Comparison: [DATE]

CLINICAL DATA: Biliary stent placement today. History of breast
cancer.

EXAM:
ABDOMEN - 1 VIEW

[supine ap]
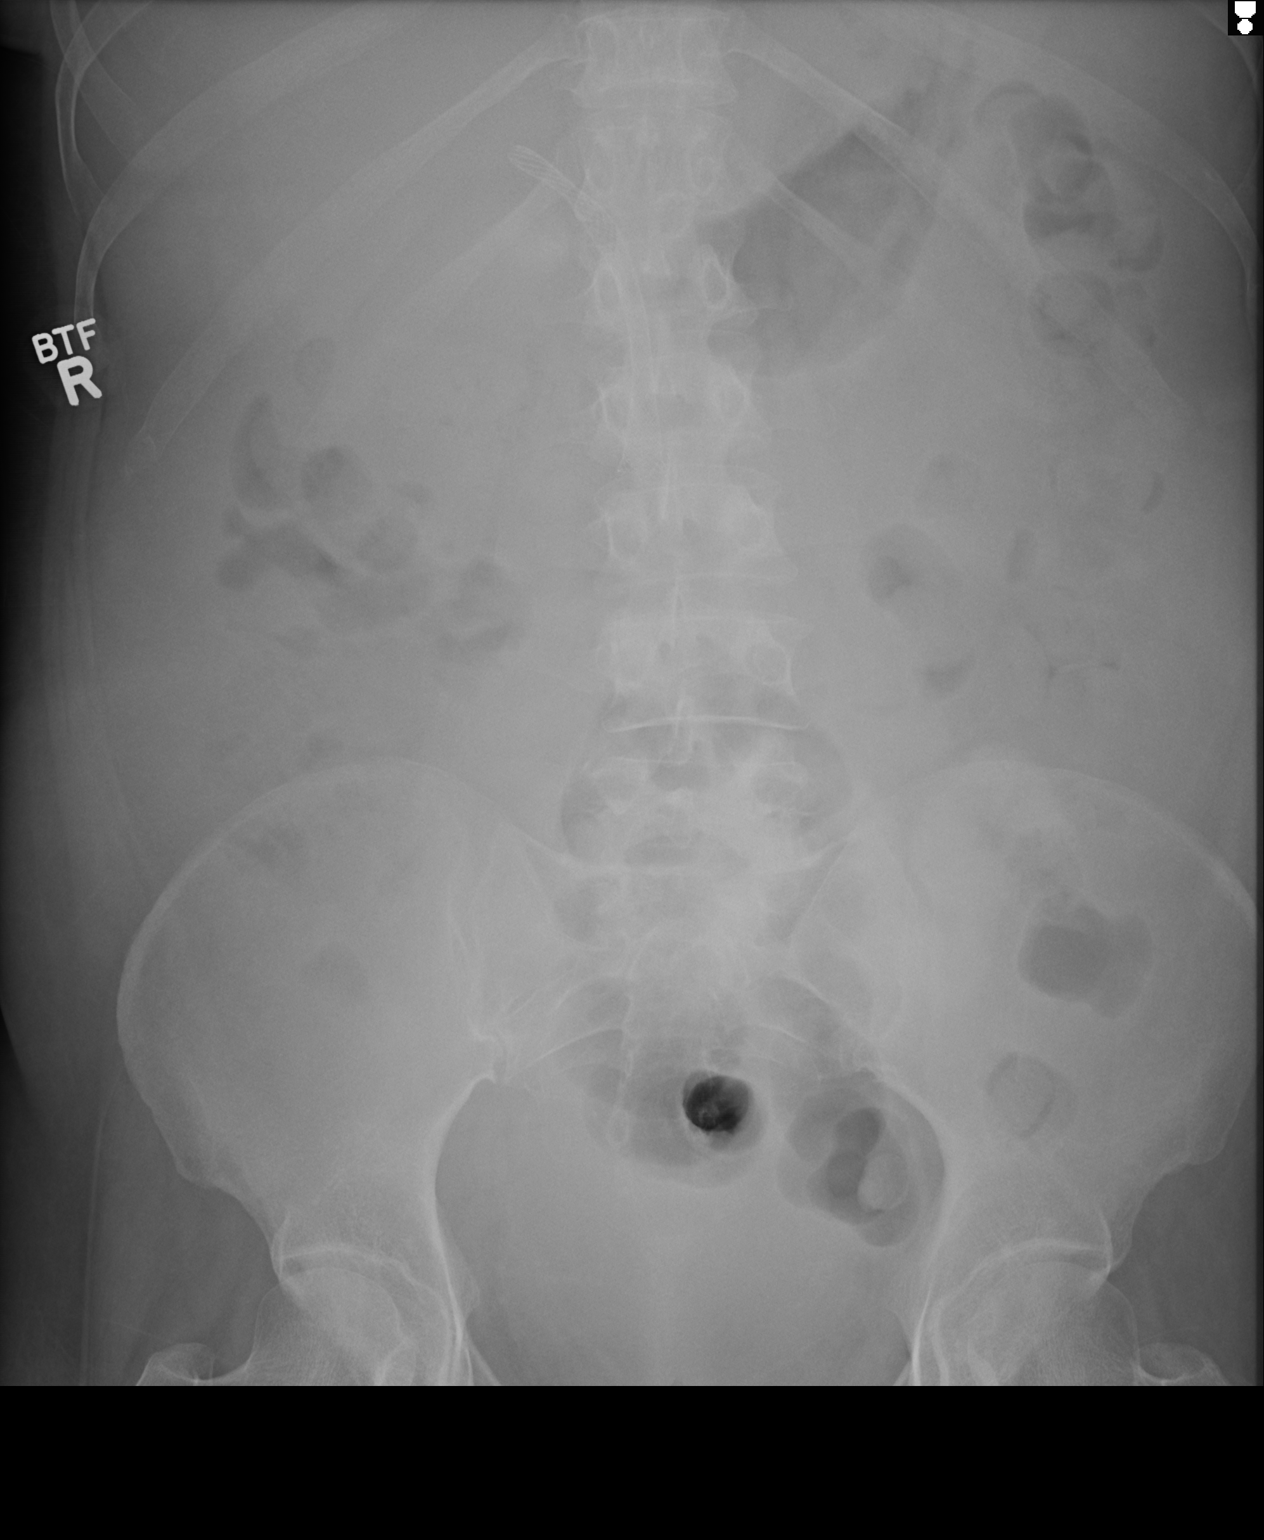

[1 of 1 positions shown; findings below may reference images not displayed]

FINDINGS: New biliary stent in adequate position over the mid to right upper
abdomen. Bowel gas pattern is nonobstructive. Remainder of the exam
is unchanged.
IMPRESSION: Nonobstructive bowel gas pattern. Biliary stent over the right mid
to upper abdomen.

## 2019-03-16 IMAGING — RF DG ERCP WO/W SPHINCTEROTOMY
1 series · 12 of 12 positions shown · non-contrast
Comparison: [DATE]

CLINICAL DATA: ERCP.  Bile duct stricture/stent

EXAM:
ERCP
TECHNIQUE: Multiple spot images obtained with the fluoroscopic device and
submitted for interpretation post-procedure.
FLUOROSCOPY TIME:  Fluoroscopy Time:  2 minutes and 55 seconds
Number of Acquired Spot Images: 6

[Series 1: run · 6 acquisitions, 12 frames shown]
[im 1/6]
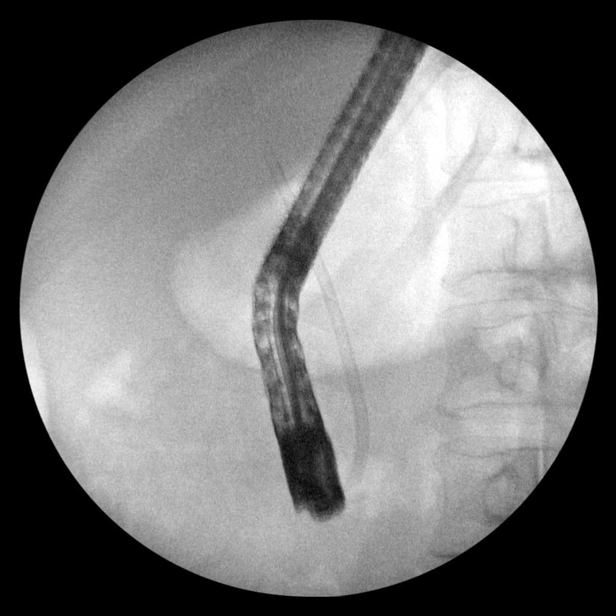
[im 2/6]
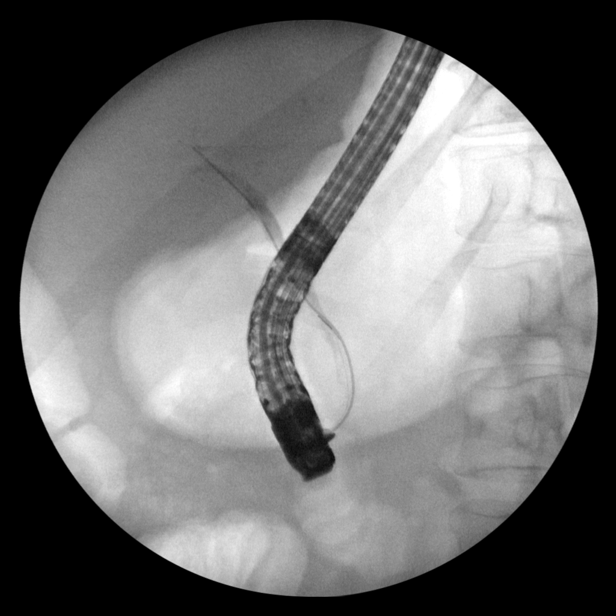
[im 2/6]
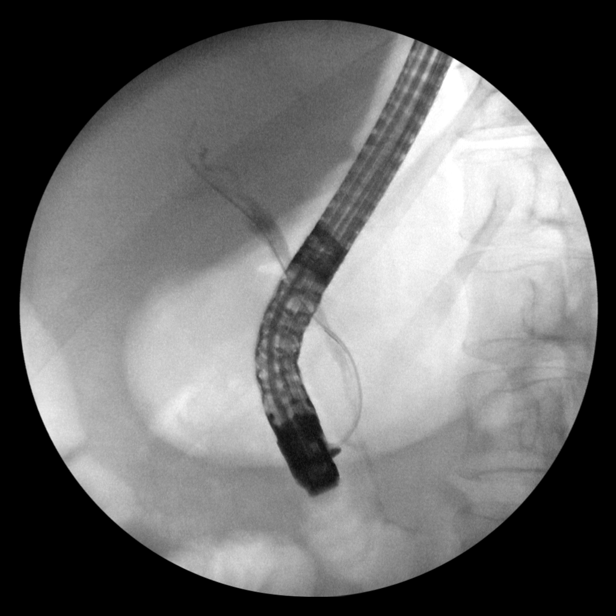
[im 2/6]
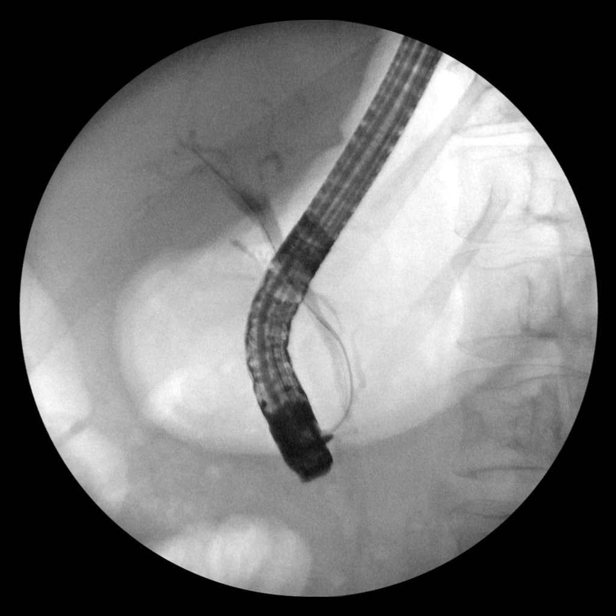
[im 2/6]
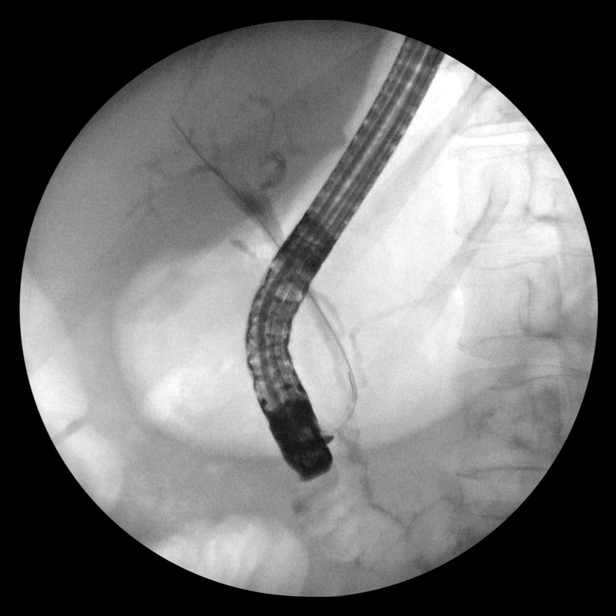
[im 3/6]
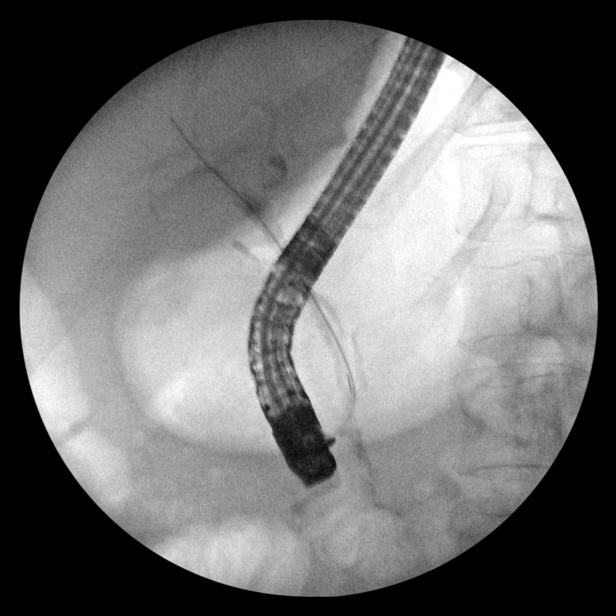
[im 4/6]
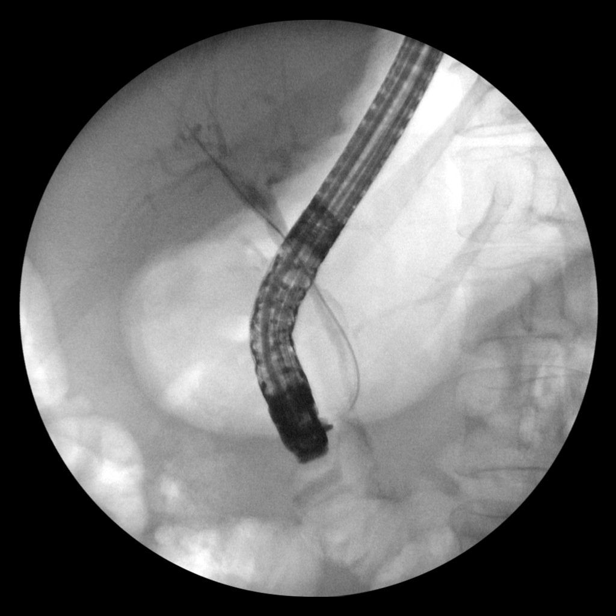
[im 4/6]
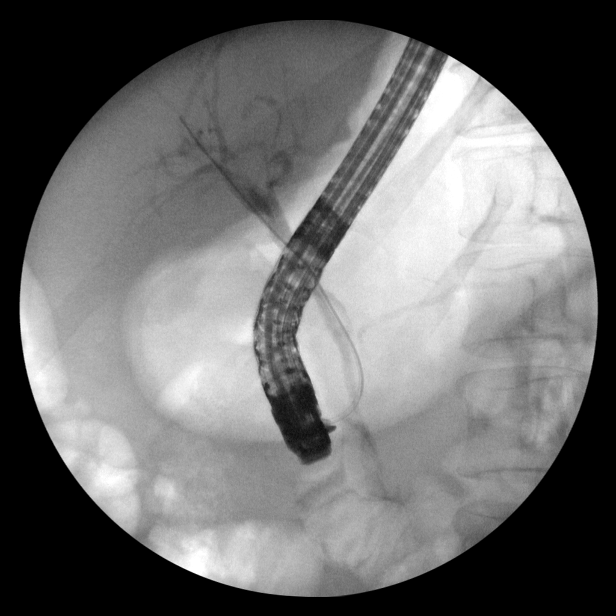
[im 4/6]
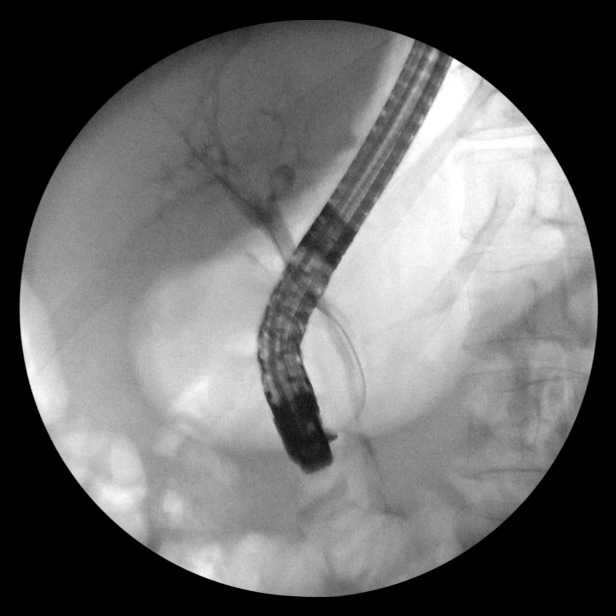
[im 4/6]
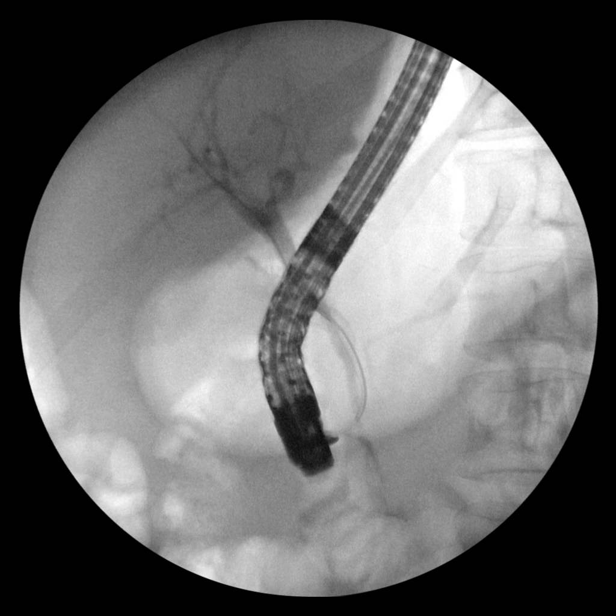
[im 5/6]
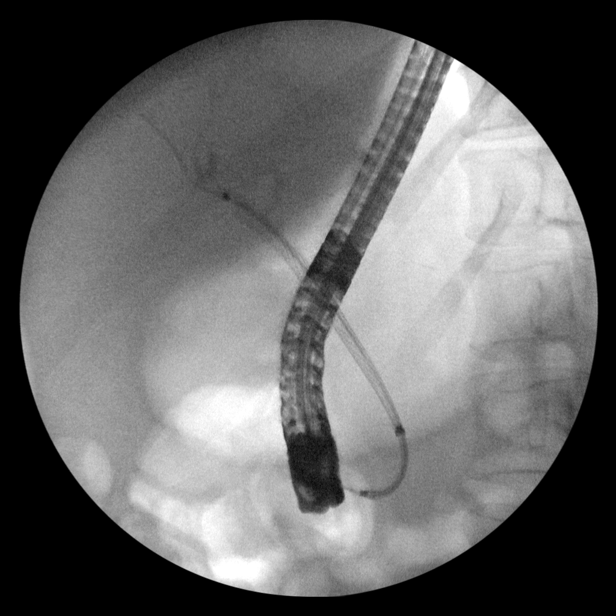
[im 6/6]
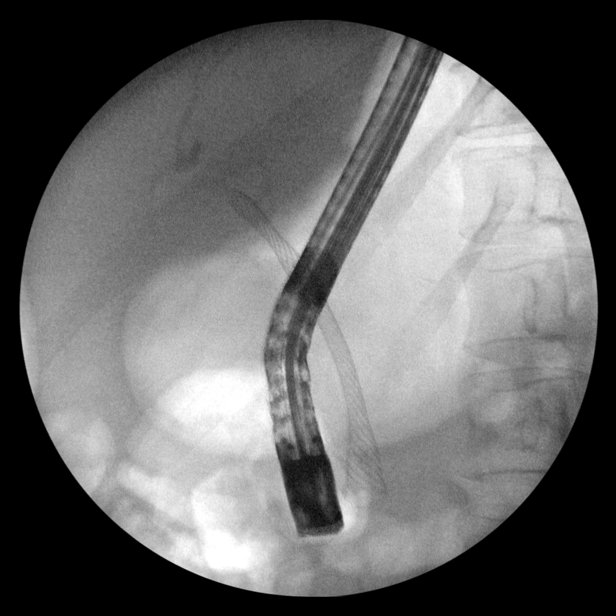

[12 of 12 positions shown; findings below may reference images not displayed]

FINDINGS: Initial images demonstrate a plastic biliary stent. There is
subsequent cannulation of the common bile duct with the junction of
contrast. Final images demonstrate deployment of a metallic biliary
stent.
IMPRESSION: ERCP with stent exchange as detailed above.

These images were submitted for radiologic interpretation only.
Please see the procedural report for the amount of contrast and the
fluoroscopy time utilized.

## 2019-03-16 SURGERY — ERCP, WITH INTERVENTION IF INDICATED
Anesthesia: General

## 2019-03-16 MED ORDER — MIDAZOLAM HCL 2 MG/2ML IJ SOLN
INTRAMUSCULAR | Status: AC
  Filled 2019-03-16: qty 2

## 2019-03-16 MED ORDER — LEVOFLOXACIN IN D5W 500 MG/100ML IV SOLN
500.0000 mg | Freq: Once | INTRAVENOUS | Status: AC
  Administered 2019-03-16: 500 mg via INTRAVENOUS

## 2019-03-16 MED ORDER — SODIUM CHLORIDE 0.9 % IV SOLN
INTRAVENOUS | Status: DC | PRN
  Administered 2019-03-16: 10:00:00 25 mL

## 2019-03-16 MED ORDER — LEVOFLOXACIN IN D5W 500 MG/100ML IV SOLN
INTRAVENOUS | Status: AC
  Filled 2019-03-16: qty 100

## 2019-03-16 MED ORDER — LACTATED RINGERS IV SOLN
Freq: Once | INTRAVENOUS | Status: AC
  Administered 2019-03-16: 09:00:00 via INTRAVENOUS

## 2019-03-16 MED ORDER — CEFAZOLIN SODIUM-DEXTROSE 2-4 GM/100ML-% IV SOLN: 2.0000 g | INTRAVENOUS | Status: DC

## 2019-03-16 MED ORDER — CHLORHEXIDINE GLUCONATE CLOTH 2 % EX PADS: 6.0000 | MEDICATED_PAD | Freq: Once | CUTANEOUS | Status: DC

## 2019-03-16 MED ORDER — FENTANYL CITRATE (PF) 100 MCG/2ML IJ SOLN
INTRAMUSCULAR | Status: DC | PRN
  Administered 2019-03-16: 50 ug via INTRAVENOUS
  Administered 2019-03-16: 100 ug via INTRAVENOUS

## 2019-03-16 MED ORDER — SUCCINYLCHOLINE 20MG/ML (10ML) SYRINGE FOR MEDFUSION PUMP - OPTIME
INTRAMUSCULAR | Status: DC | PRN
  Administered 2019-03-16: 110 mg via INTRAVENOUS

## 2019-03-16 MED ORDER — PROPOFOL 10 MG/ML IV BOLUS
INTRAVENOUS | Status: DC | PRN
  Administered 2019-03-16: 40 mg via INTRAVENOUS
  Administered 2019-03-16: 160 mg via INTRAVENOUS

## 2019-03-16 MED ORDER — CEFAZOLIN SODIUM-DEXTROSE 2-4 GM/100ML-% IV SOLN
INTRAVENOUS | Status: AC
  Filled 2019-03-16: qty 100

## 2019-03-16 MED ORDER — HYDROMORPHONE HCL 1 MG/ML IJ SOLN: 0.2500 mg | INTRAMUSCULAR | Status: DC | PRN

## 2019-03-16 MED ORDER — FENTANYL CITRATE (PF) 100 MCG/2ML IJ SOLN
INTRAMUSCULAR | Status: AC
  Filled 2019-03-16: qty 2

## 2019-03-16 MED ORDER — LACTATED RINGERS IV SOLN
INTRAVENOUS | Status: DC | PRN
  Administered 2019-03-16: 08:00:00 via INTRAVENOUS

## 2019-03-16 MED ORDER — PROMETHAZINE HCL 25 MG PO TABS
25.0000 mg | ORAL_TABLET | Freq: Once | ORAL | Status: AC
  Administered 2019-03-16: 25 mg via ORAL
  Filled 2019-03-16 (×2): qty 1

## 2019-03-16 MED ORDER — MIDAZOLAM HCL 5 MG/5ML IJ SOLN
INTRAMUSCULAR | Status: DC | PRN
  Administered 2019-03-16: 2 mg via INTRAVENOUS

## 2019-03-16 MED ORDER — ONDANSETRON 4 MG PO TBDP
4.0000 mg | ORAL_TABLET | Freq: Once | ORAL | Status: AC
  Administered 2019-03-16: 4 mg via ORAL

## 2019-03-16 MED ORDER — ONDANSETRON HCL 4 MG/2ML IJ SOLN
INTRAMUSCULAR | Status: AC
  Filled 2019-03-16: qty 2

## 2019-03-16 MED ORDER — ALBUTEROL SULFATE HFA 108 (90 BASE) MCG/ACT IN AERS
INHALATION_SPRAY | RESPIRATORY_TRACT | Status: AC
  Filled 2019-03-16: qty 13.4

## 2019-03-16 MED ORDER — LABETALOL HCL 5 MG/ML IV SOLN
INTRAVENOUS | Status: DC | PRN
  Administered 2019-03-16: 5 mg via INTRAVENOUS

## 2019-03-16 MED ORDER — WATER FOR IRRIGATION, STERILE IR SOLN
Status: DC | PRN
  Administered 2019-03-16: 1000 mL

## 2019-03-16 MED ORDER — SODIUM CHLORIDE 0.9 % IV SOLN
INTRAVENOUS | Status: AC
  Filled 2019-03-16: qty 50

## 2019-03-16 MED ORDER — PROPOFOL 10 MG/ML IV BOLUS
INTRAVENOUS | Status: AC
  Filled 2019-03-16: qty 20

## 2019-03-16 MED ORDER — ONDANSETRON 4 MG PO TBDP
ORAL_TABLET | ORAL | Status: AC
  Filled 2019-03-16: qty 1

## 2019-03-16 MED ORDER — ONDANSETRON HCL 4 MG/2ML IJ SOLN
INTRAMUSCULAR | Status: DC | PRN
  Administered 2019-03-16: 4 mg via INTRAVENOUS

## 2019-03-16 MED ORDER — GLUCAGON HCL RDNA (DIAGNOSTIC) 1 MG IJ SOLR
INTRAMUSCULAR | Status: AC
  Filled 2019-03-16: qty 2

## 2019-03-16 MED ORDER — GLUCAGON HCL RDNA (DIAGNOSTIC) 1 MG IJ SOLR
INTRAMUSCULAR | Status: DC | PRN
  Administered 2019-03-16: .25 mg via INTRAVENOUS

## 2019-03-16 NOTE — Progress Notes (Signed)
Brief ERCP note.  Diffuse changes of portal hypertensive gastropathy and diffuse edema to duodenal mucosa with friability. Plastic biliary stent removed with polypectomy snare under fluoroscopic control. Fully covered 10 mm x 60 mm Wallstent placed into the bile duct. 80 cm stent was too long. Patient tolerated the procedure well.

## 2019-03-16 NOTE — Anesthesia Procedure Notes (Signed)
Procedure Name: Intubation Date/Time: 03/16/2019 9:14 AM Performed by: Ollen Bowl, CRNA Pre-anesthesia Checklist: Patient identified, Patient being monitored, Timeout performed, Emergency Drugs available and Suction available Patient Re-evaluated:Patient Re-evaluated prior to induction Oxygen Delivery Method: Circle system utilized Preoxygenation: Pre-oxygenation with 100% oxygen Induction Type: IV induction Ventilation: Mask ventilation without difficulty Laryngoscope Size: Mac and 3 Grade View: Grade I Tube type: Oral Tube size: 7.0 mm Number of attempts: 1 Airway Equipment and Method: Stylet Placement Confirmation: ETT inserted through vocal cords under direct vision,  positive ETCO2 and breath sounds checked- equal and bilateral Secured at: 21 cm Tube secured with: Tape Dental Injury: Teeth and Oropharynx as per pre-operative assessment

## 2019-03-16 NOTE — Discharge Instructions (Signed)
Endoscopic Retrograde Cholangiopancreatogram, Care After This sheet gives you information about how to care for yourself after your procedure. Your health care provider may also give you more specific instructions. If you have problems or questions, contact your health care provider. What can I expect after the procedure? After the procedure, it is common to have:  Soreness in your throat.  Nausea.  Bloating.  Dizziness.  Tiredness (fatigue). Follow these instructions at home:   Take over-the-counter and prescription medicines only as told by your health care provider.  Do not drive for 24 hours if you were given a medicine to help you relax (sedative) during your procedure. Have someone stay with you for 24 hours after the procedure.  Return to your normal activities as told by your health care provider. Ask your health care provider what activities are safe for you.  Return to eating what you normally do as soon as you feel well enough or as told by your health care provider.  Keep all follow-up visits as told by your health care provider. This is important. Contact a health care provider if:  You have pain in your abdomen that does not get better with medicine.  You develop signs of infection, such as: ? Chills. ? Feeling unwell. Get help right away if:  You have difficulty swallowing.  You have worsening pain in your throat, chest, or abdomen.  You vomit bright red blood or a substance that looks like coffee grounds.  You have bloody or very black stools.  You have a fever.  You have a sudden increase in swelling (bloating) in your abdomen. Summary  After the procedure, it is common to feel tired and to have some discomfort in your throat.  Contact your health care provider if you have signs of infection--such as chills or feeling unwell--or if you have pain that does not improve with medicine.  Get help right away if you have trouble swallowing, worsening  pain, bloody or black vomit, bloody or black stools, a fever, or increased swelling in your abdomen.  Keep all follow-up visits as told by your health care provider. This is important. This information is not intended to replace advice given to you by your health care provider. Make sure you discuss any questions you have with your health care provider. Document Released: 03/17/2013 Document Revised: 05/09/2017 Document Reviewed: 04/15/2016 Elsevier Patient Education  2020 [place] usual medications as before. Clear liquids today.  Resume usual diet starting tomorrow morning. No driving for 24 hours. Follow-up with Dr. [name] later this week.

## 2019-03-16 NOTE — Anesthesia Preprocedure Evaluation (Addendum)
Anesthesia Evaluation  Patient identified by MRN, date of birth, ID band Patient awake    Reviewed: Allergy & Precautions, NPO status , Patient's Chart, lab work & pertinent test results, reviewed documented beta blocker date and time   History of Anesthesia Complications (+) PONV and history of anesthetic complications  Airway Mallampati: II  TM Distance: >3 FB Neck ROM: Full    Dental  (+) Chipped, Dental Advisory Given,  Chipped teeth x2:   Pulmonary asthma , pneumonia, resolved,  Pleural effusion   Pulmonary exam normal        Cardiovascular Exercise Tolerance: Poor Normal cardiovascular exam Rhythm:Regular Rate:Normal     Neuro/Psych  Headaches, PSYCHIATRIC DISORDERS Anxiety Depression    GI/Hepatic GERD  Medicated and Controlled,Metastatic breast cancer to liver CBD stricture ascitis    Endo/Other  negative endocrine ROS  Renal/GU negative Renal ROSHypokalemia - 3.2  negative genitourinary   Musculoskeletal negative musculoskeletal ROS (+)   Abdominal Normal abdominal exam  (+)   Peds  Hematology  (+) anemia , Metastatic breast cancer   Anesthesia Other Findings EKG- sinus tachycardia IMPRESSION: Changes consistent with colitis in the descending and sigmoid colons. No perforation is noted. Left-sided pleural effusion slightly decreased in the interval from the prior exam. Stable hepatic metastatic disease.  Biliary stent is noted in place. Electronically Signed  By: Inez Catalina M.D.   On: 01/28/2019 10:38  Reproductive/Obstetrics negative OB ROS                          Anesthesia Physical Anesthesia Plan  ASA: IV  Anesthesia Plan: General   Post-op Pain Management:    Induction: Intravenous  PONV Risk Score and Plan: 4 or greater and Ondansetron, Dexamethasone and Scopolamine patch - Pre-op  Airway Management Planned: Oral ETT  Additional Equipment:   Intra-op  Plan:   Post-operative Plan: Extubation in OR  Informed Consent: I have reviewed the patients History and Physical, chart, labs and discussed the procedure including the risks, benefits and alternatives for the proposed anesthesia with the patient or authorized representative who has indicated his/her understanding and acceptance.     Dental advisory given  Plan Discussed with: CRNA  Anesthesia Plan Comments:         Anesthesia Quick Evaluation

## 2019-03-16 NOTE — Anesthesia Postprocedure Evaluation (Signed)
Anesthesia Post Note  Patient: Pamela Stoos Griebel  Procedure(s) Performed: ENDOSCOPIC RETROGRADE CHOLANGIOPANCREATOGRAPHY (ERCP) (N/A ) BILIARY STENT PLACEMENT AND STENT EXCHANGE (N/A )  Patient location during evaluation: PACU Anesthesia Type: General Level of consciousness: awake and alert and oriented Pain management: pain level controlled Vital Signs Assessment: post-procedure vital signs reviewed and stable Respiratory status: spontaneous breathing Cardiovascular status: blood pressure returned to baseline Postop Assessment: no apparent nausea or vomiting Anesthetic complications: no     Last Vitals:  Vitals:   03/16/19 1130 03/16/19 1151  BP: (!) 117/54 120/88  Pulse: (!) 101 (!) 101  Resp: 15 18  Temp:  36.8 C  SpO2: 94% 96%    Last Pain:  Vitals:   03/16/19 1151  TempSrc: Oral  PainSc: 0-No pain                 Ivo Moga

## 2019-03-16 NOTE — Transfer of Care (Signed)
Immediate Anesthesia Transfer of Care Note  Patient: Pamela Long  Procedure(s) Performed: ENDOSCOPIC RETROGRADE CHOLANGIOPANCREATOGRAPHY (ERCP) (N/A ) BILIARY STENT PLACEMENT AND STENT EXCHANGE (N/A )  Patient Location: PACU  Anesthesia Type:General  Level of Consciousness: awake  Airway & Oxygen Therapy: Patient Spontanous Breathing  Post-op Assessment: Report given to RN  Post vital signs: Reviewed and stable  Last Vitals:  Vitals Value Taken Time  BP 126/54 03/16/19 1031  Temp    Pulse 97 03/16/19 1038  Resp 18 03/16/19 1038  SpO2 98 % 03/16/19 1038  Vitals shown include unvalidated device data.  Last Pain:  Vitals:   03/16/19 0812  TempSrc: Oral  PainSc: 2       Patients Stated Pain Goal: 4 ([id number])  Complications: No apparent anesthesia complications

## 2019-03-16 NOTE — H&P (Signed)
Pamela Long is an 47 y.o. female.   Chief Complaint: Patient is here for ERCP and stent change. HPI: Patient is 48 year old Caucasian female with history of metastatic breast carcinoma who presented back in April with obstructive jaundice.  She had biliary obstruction involving proximal bile duct felt to be due to metastatic disease.  In addition she also had large tumor burden in liver.  She underwent biliary stenting.  Plastic stent was placed.  This resulted in resolution of her cholestasis and normalization of her transaminases.  Bilirubin was around 15 at the time of presentation.  She has done well.  She is receiving chemotherapy under supervision of Dr. [name] and tumor burden has decreased.  Over the last few weeks there has been slight increase in her alkaline phosphatase and AST.  She also reports intermittent postprandial right upper quadrant pain.  She has not had fever chills nausea or vomiting.  She said at times her urine is dark in color.  She also has developed and has undergone 2 abdominal paracentesis.  First 1 was in December 24, 2018 and second 1 was on 02/12/2019. Patient is scheduled to undergo chemotherapy later this week.  Past Medical History:  Diagnosis Date  . Anemia   . Anxiety   . Asthma   . Breast cancer ([place])    Left, 2017  . Complication of anesthesia   . Depression   . GERD (gastroesophageal reflux disease)   . Migraine   . OAB (overactive bladder)   . PONV (postoperative nausea and vomiting)   . Seasonal allergies     Past Surgical History:  Procedure Laterality Date  . ABDOMINAL HYSTERECTOMY     breast cancer  . ANKLE RECONSTRUCTION Right 1989  . BILIARY STENT PLACEMENT N/A 09/22/2018   Procedure: BILIARY STENT PLACEMENT;  Surgeon: Rogene Houston, MD;  Location: [facility];  Service: Endoscopy;  Laterality: N/A;  . BREAST SURGERY    . COLONOSCOPY WITH PROPOFOL N/A 08/06/2018   Dr. Oneida Alar: small internal hemorrhoids, moderate external hemorrhoids.  rectal bleeding due to ?anal fissure vs hemorrhoids. nex tcs 10 years  . ERCP N/A 09/22/2018   Procedure: ENDOSCOPIC RETROGRADE CHOLANGIOPANCREATOGRAPHY (ERCP);  Surgeon: Rogene Houston, MD;  Location: [facility];  Service: Endoscopy;  Laterality: N/A;  . FOREIGN BODY REMOVAL N/A 08/29/2017   from lip  . KNEE SURGERY Right 1989   bone spur  . MASTECTOMY  2017   bil mastectomies  . PORTACATH PLACEMENT Left 12/23/2018   Procedure: INSERTION PORT-A-CATH LEFT SUBCLAVIAN;  Surgeon: Aviva Signs, MD;  Location: [facility] ORS;  Service: General;  Laterality: Left;  . SINUSOTOMY      Family History  Problem Relation Age of Onset  . Arthritis Mother   . COPD Mother   . Depression Mother   . Diabetes Mother   . Kidney disease Father   . Heart disease Father 50  . Drug abuse Father   . Hypertension Father   . Diabetes Daughter   . Hashimoto's thyroiditis Daughter   . Irritable bowel syndrome Daughter   . Autism spectrum disorder Daughter   . Early death Maternal Grandmother        drowned  . Early death Maternal Grandfather   . Heart disease Maternal Grandfather   . Heart disease Paternal Grandfather   . Breast cancer Maternal Aunt   . Breast cancer Cousin   . Lung cancer Maternal Aunt   . Lung cancer Maternal Uncle   . Colon cancer  Neg Hx    Social History:  reports that she has never smoked. She has never used smokeless tobacco. She reports that she does not drink alcohol or use drugs.  Allergies:  Allergies  Allergen Reactions  . Salagen [Pilocarpine] Other (See Comments)    Can cause liver failure   . Tape Itching and Other (See Comments)    Depending on the adhesive-blistering occurs  . Amoxicillin Hives and Other (See Comments)    Rash only DID THE REACTION INVOLVE: Swelling of the face/tongue/throat, SOB, or low BP? Sudden or severe rash/hives, skin peeling, or the inside of the mouth or nose?  Did it require medical treatment?  When did it last happen? If all  above answers are "NO", may proceed with cephalosporin use.   . Caffeine Diarrhea, Nausea Only, Palpitations and Other (See Comments)    Headache  . Tetanus Toxoids Swelling and Other (See Comments)    Local reaction    Medications Prior to Admission  Medication Sig Dispense Refill  . escitalopram (LEXAPRO) 20 MG tablet Take 1 tablet (20 mg total) by mouth daily. 90 tablet 3  . furosemide (LASIX) 20 MG tablet Take 20 mg by mouth daily.     Marland Kitchen gabapentin (NEURONTIN) 300 MG capsule Take 1 capsule (300 mg total) by mouth at bedtime. 30 capsule 1  . lubiprostone (AMITIZA) 8 MCG capsule Take 1 capsule (8 mcg total) by mouth 2 (two) times daily with a meal. 60 capsule 2  . magic mouthwash w/lidocaine SOLN Take 5 mLs by mouth 4 (four) times daily as needed for mouth pain. 360 mL 2  . magnesium oxide (MAG-OX) 400 MG tablet TAKE 1 TABLET BY MOUTH EVERY DAY 90 tablet 1  . nystatin-triamcinolone (MYCOLOG II) cream Apply 1 application topically 2 (two) times daily.     Marland Kitchen oxyCODONE-acetaminophen (PERCOCET/ROXICET) 5-325 MG tablet Take 1 tablet by mouth every 4 (four) hours as needed for severe pain.    Marland Kitchen PACLitaxel (TAXOL IV) Inject into the vein every 14 (fourteen) days.     . pantoprazole (PROTONIX) 40 MG tablet TAKE 1 TABLET BY MOUTH EVERY DAY BEFORE BREAKFAST 90 tablet 3  . Polyvinyl Alcohol-Povidone PF (REFRESH) 1.4-0.6 % SOLN Place 1 drop into both eyes as needed (for dry eyes).     . potassium chloride (KLOR-CON) 10 MEQ tablet Take 10 mEq by mouth 2 (two) times daily.    . rizatriptan (MAXALT-MLT) 10 MG disintegrating tablet Take 10 mg by mouth every 2 (two) hours as needed for migraine.     Marland Kitchen scopolamine (TRANSDERM-SCOP) 1 MG/3DAYS Place 1 patch (1.5 mg total) onto the skin every 3 (three) days. 10 patch 12  . spironolactone (ALDACTONE) 25 MG tablet TAKE 1 TABLET BY MOUTH EVERY DAY 30 tablet 0  . temazepam (RESTORIL) 15 MG capsule TAKE 1 CAPSULE (15 MG TOTAL) BY MOUTH AT BEDTIME AS NEEDED FOR  SLEEP. (Patient taking differently: Take 15 mg by mouth at bedtime. ) 30 capsule 0    No results found for this or any previous visit (from the past 48 hour(s)). No results found.  ROS  Pulse 100, temperature 98.5 F (36.9 C), temperature source Oral, resp. rate 18, height 5' 5.5" (1.664 m), weight 80.3 kg. Physical Exam  Constitutional: She appears well-developed and well-nourished.  HENT:  Mouth/Throat: Oropharynx is clear and moist.  Eyes: Conjunctivae are normal. No scleral icterus.  Neck: No thyromegaly present.  Cardiovascular: Normal rate, regular rhythm and normal heart sounds.  No murmur heard.  Respiratory: Effort normal and breath sounds normal.  She has PowerPort at left pectoral region  GI:  Is full.  On palpation is soft.  She has mild tenderness on deep palpation in right mid abdomen.  No organomegaly or masses.  Musculoskeletal:        General: No edema.  Lymphadenopathy:    She has no cervical adenopathy.  Neurological: She is alert.  Skin: Skin is warm.    Lab data from 03/11/2019 reviewed WBC 2.6 H&H 9.5 and 31.5 and platelet count 183K. Bilirubin 1.0 AP 185 AST 56 ALT 27 total protein 6.0 with albumin of 2.7.  SARS-CoV-2 test was negative 4 days ago.   Assessment/Plan Patient is 48 year old Caucasian female with metastatic breast carcinoma who also has biliary obstruction treated with stenting back in April 2020.  She has done well.  She has been able to receive chemotherapy and liver tube burden has decreased.  There has been slight increase in alkaline phosphatase and AST and she is also having intermittent right upper quadrant abdominal pain.  Therefore the stent needs to be changed. Unless the tumor has progressed to hilum plan is to put it covered Wallstent.  Risk of cholecystitis discussed with patient.  Incidences about the same between covered and uncovered since.  With uncovered stents the flip side is ingrowth of tumor.  Therefore will use fully  covered stent. Patient is agreeable.  Hildred Laser, MD 03/16/2019, 8:47 AM

## 2019-03-17 ENCOUNTER — Telehealth (HOSPITAL_COMMUNITY): Payer: Self-pay

## 2019-03-17 ENCOUNTER — Ambulatory Visit (HOSPITAL_COMMUNITY): Payer: 59

## 2019-03-17 ENCOUNTER — Telehealth (HOSPITAL_COMMUNITY): Payer: Self-pay | Admitting: Physical Therapy

## 2019-03-17 NOTE — Telephone Encounter (Signed)
pt called to cance the appts due to she had a procedure done yesterday.

## 2019-03-17 NOTE — Telephone Encounter (Signed)
pt called to cance the apptsl due to she had a procedure done yesterday.

## 2019-03-19 ENCOUNTER — Ambulatory Visit (HOSPITAL_COMMUNITY): Payer: 59 | Admitting: Physical Therapy

## 2019-03-22 ENCOUNTER — Ambulatory Visit (HOSPITAL_COMMUNITY): Payer: 59 | Attending: Hematology

## 2019-03-22 ENCOUNTER — Encounter (HOSPITAL_COMMUNITY): Payer: Self-pay

## 2019-03-22 ENCOUNTER — Other Ambulatory Visit: Payer: Self-pay

## 2019-03-22 DIAGNOSIS — R2689 Other abnormalities of gait and mobility: Secondary | ICD-10-CM | POA: Diagnosis not present

## 2019-03-22 DIAGNOSIS — M6281 Muscle weakness (generalized): Secondary | ICD-10-CM | POA: Diagnosis present

## 2019-03-22 NOTE — Therapy (Signed)
[place], [place], [address] Phone: [phone]   Fax:  [phone]  Physical Therapy Treatment  Patient Details  Name: Pamela Long MRN: [id number] Date of Birth: Feb 15, 1971 Referring Provider (PT): Derek Jack   Encounter Date: 03/22/2019  PT End of Session - 03/22/19 1343    Visit Number  4    Number of Visits  18    Date for PT Re-Evaluation  04/14/19    PT Start Time  0145    PT Stop Time  0225    PT Time Calculation (min)  40 min    Equipment Utilized During Treatment  Gait belt    Activity Tolerance  Patient limited by fatigue    Behavior During Therapy  [facility] for tasks assessed/performed       Past Medical History:  Diagnosis Date  . Anemia   . Anxiety   . Asthma   . Breast cancer ([place])    Left, 2017  . Complication of anesthesia   . Depression   . GERD (gastroesophageal reflux disease)   . Migraine   . OAB (overactive bladder)   . PONV (postoperative nausea and vomiting)   . Seasonal allergies     Past Surgical History:  Procedure Laterality Date  . ABDOMINAL HYSTERECTOMY     breast cancer  . ANKLE RECONSTRUCTION Right 1989  . BILIARY STENT PLACEMENT N/A 09/22/2018   Procedure: BILIARY STENT PLACEMENT;  Surgeon: Rogene Houston, MD;  Location[facility];  Service: Endoscopy;  Laterality: N/A;  . BREAST SURGERY    . COLONOSCOPY WITH PROPOFOL N/A 08/06/2018   Dr. Oneida Alar: small internal hemorrhoids, moderate external hemorrhoids. rectal bleeding due to ?anal fissure vs hemorrhoids. nex tcs 10 years  . ERCP N/A 09/22/2018   Procedure: ENDOSCOPIC RETROGRADE CHOLANGIOPANCREATOGRAPHY (ERCP);  Surgeon: Rogene Houston, MD;  Location: [facility];  Service: Endoscopy;  Laterality: N/A;  . FOREIGN BODY REMOVAL N/A 08/29/2017   from lip  . KNEE SURGERY Right 1989   bone spur  . MASTECTOMY  2017   bil mastectomies  . PORTACATH PLACEMENT Left 12/23/2018   Procedure: INSERTION  PORT-A-CATH LEFT SUBCLAVIAN;  Surgeon: Aviva Signs, MD;  Location: [facility] ORS;  Service: General;  Laterality: Left;  . SINUSOTOMY      There were no vitals filed for this visit.  Subjective Assessment - 03/22/19 1343    Subjective  Pt presents to therapy after endoscopic retrograde cholangiopancreatography (ERCP), biliary stent placement on 03/16/19. Pt reports no new news.    Pertinent History  breast cancer 2017; DX with metastatic breast cancer in Jan 2020    How long can you sit comfortably?  no problem    How long can you stand comfortably?  varies but 10 minutes max.    How long can you walk comfortably?  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    Currently in Pain?  No/denies    Pain Onset  More than a month ago               [facility] Adult PT Treatment/Exercise - 03/22/19 0001      Lumbar Exercises: Machines for Strengthening   Leg Press  30#, 10 reps      Lumbar Exercises: Standing   Heel Raises  10 reps    Heel Raises Limitations  toe raises, 10 res    Functional Squats  10 reps    Forward Lunge  5 seconds    Forward Lunge Limitations  BLE, // bars for support    Scapular Retraction  Strengthening;Both;10 reps;Theraband    Theraband Level (Scapular Retraction)  Level 3 (Green)    Row  Strengthening;Both;10 reps;Theraband    Theraband Level (Row)  Level 3 (Green)    Shoulder Extension  Strengthening;Both;10 reps    Theraband Level (Shoulder Extension)  Level 3 (Green)    Other Standing Lumbar Exercises  PNF D1,D2 x 10 green tband       Lumbar Exercises: Seated   Sit to Stand  10 reps          Balance Exercises - 03/22/19 1427      Balance Exercises: Standing   SLS with Vectors  3 reps   3 sec hold, forward/lateral/back   Tandem Gait  Forward   1RT on blue line   Step Over Hurdles / Cones  4 6" hurdles, 2RT     Other Standing Exercises  tandem stance, 5 reps each LE back, BUE flexion 2# bar; tandem stance, 5 reps each LE back, paloff press 2# bar        PT Education - 03/22/19 1343    Education Details  Exercise technique, continue HEP    Person(s) Educated  Patient    Methods  Explanation    Comprehension  Verbalized understanding       PT Short Term Goals - 03/05/19 1120      PT SHORT TERM GOAL #1   Title  PT core and LE strength to be increased by one grade to allow pt to be able to complete her housework without having to rest.    Time  3    Period  Weeks    Status  On-going    [employer] Date  03/24/19      PT SHORT TERM GOAL #2   Title  PT to be able to walk for 15 minutes at a time to be able to complete short shopping trips with confidence.    Time  3    Period  Weeks    Status  On-going      PT SHORT TERM GOAL #3   Title  PT to be able to single leg stance for 30 seconds on both LE to reduce risk of falling.    Time  3    Period  Weeks    Status  On-going        PT Long Term Goals - 03/05/19 1121      PT LONG TERM GOAL #1   Title  PT core and LE strength to be increased by one grade to allow pt to carry groceries up her steps into her house without difficulty.    Time  6    Period  Weeks    Status  On-going      PT LONG TERM GOAL #2   Title  PT to be able to single leg stance for at least 45 seconds bilaterally to be able to be confident walking on uneven terrain.    Time  6    Period  Weeks    Status  On-going      PT LONG TERM GOAL #3   Title  P  pain to be no greater than 3/10 to be able to complete her normal activities for  daily living in greater comfort.    Time  6    Period  Weeks    Status  On-going            Plan - 03/22/19 1344    Clinical Impression Statement  Pt tolerated treatment session well today, requiring 3 therapeutic rest breaks to recover. Pt demonstrates unsteadiness with tandem gait requiring protective stepping laterally and with  improved balance with narrow gait maintaining feet on blue line, but hele-toe pattern only 25% of the time. Added gait over hurdles and vector stance with 3 sec holds. Pt requires UE assist for SLS with vector stance, able to perform 3 reps through holding for 3 sec forward, lateral, and back each round. Pt without loss of balance or protective stepping with hurdle gait, but requires decreased cadence and increased attention to execute. Continue to progress as able.    Personal Factors and Comorbidities  Comorbidity 1    Comorbidities  metastatic cancer    Examination-Activity Limitations  Carry;Lift;Locomotion Level;Squat;Stairs;Stand    Examination-Participation Restrictions  Cleaning;Community Activity;Laundry;Meal Prep;Shop;Other;Yard Work    [employer] officer  Evolving/Moderate complexity    Rehab Potential  Good    PT Frequency  3x / week    PT Duration  6 weeks    PT Treatment/Interventions  ADLs/Self Care Home Management;Gait training;Stair training;Therapeutic activities;Therapeutic exercise;Balance training;Patient/family education    PT Next Visit Plan  Hold new activity until pt only needs 2 rest periods per session. Continue BLE strengthening and balance exercises within pt tolerance.    PT Home Exercise Plan  sit to stand, heel raises, single leg stance and walking program.    Consulted and Agree with Plan of Care  Patient       Patient will benefit from skilled therapeutic intervention in order to improve the following deficits and impairments:  Abnormal gait, Cardiopulmonary status limiting activity, Decreased endurance, Decreased activity tolerance, Decreased balance, Decreased strength, Difficulty walking, Pain  Visit Diagnosis: Other abnormalities of gait and mobility  Muscle weakness (generalized)     Problem List Patient Active Problem List   Diagnosis Date Noted  . 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 RUQ abdominal pain 03/11/2019  . Colitis  01/28/2019  . Chemotherapy-induced neutropenia ([place])   . Dehydration   . Lactic acid acidosis   . Nausea vomiting and diarrhea   . Status post thoracentesis   . Palliative care by specialist   . Community acquired pneumonia of left lung   . Acute on chronic respiratory failure with hypoxia ([place]) 12/13/2018  . Sepsis due to undetermined organism ([place]) 12/13/2018  . Pleural effusion on left   . HCAP (healthcare-associated pneumonia) 12/12/2018  . Febrile neutropenia ([place]) 12/12/2018  . Hypokalemia 12/12/2018  . Hyponatremia 12/12/2018  . Severe protein-calorie malnutrition ([place]) 12/12/2018  . Sepsis ([place]) 12/12/2018  . Obstructive jaundice 09/21/2018  . Goals of care, counseling/discussion 09/17/2018  . Abdominal pain 09/09/2018  . RUQ pain 07/08/2018  . Metastatic breast cancer ([place]) 07/02/2018  . Liver mass 06/27/2018  . GERD (gastroesophageal reflux disease) 04/02/2018  . Rectal bleeding 04/02/2018  . Constipation 04/02/2018  . Acquired absence of left breast 08/01/2017  . OAB (overactive bladder) 07/31/2017  . Vitamin D deficiency 02/26/2017  . Prediabetes 02/26/2017  . HLD (hyperlipidemia) 02/26/2017  . Asthma 02/12/2017  . Depression 02/12/2017  . Migraines 02/12/2017  . Arthralgia 02/12/2017  . Sleep-disordered breathing 02/12/2017  . Positive ANA (antinuclear antibody) 02/12/2017  . Angular cheilitis 02/12/2017  . Class 2  obesity due to excess calories without serious comorbidity with body mass index (BMI) of 35.0 to 35.9 in adult 02/12/2017  . Malignant neoplasm of central portion of left breast in female, estrogen receptor positive ([place]) 01/05/2017     Talbot Grumbling PT, DPT 03/22/19, 2:32 PM [place] [address] [place], [place], [address] Phone: [phone]   Fax:  [phone]  Name: Pamela Long MRN: [id number] Date of Birth: [birth year]

## 2019-03-23 ENCOUNTER — Encounter (HOSPITAL_COMMUNITY): Payer: Self-pay | Admitting: Hematology

## 2019-03-23 ENCOUNTER — Inpatient Hospital Stay (HOSPITAL_BASED_OUTPATIENT_CLINIC_OR_DEPARTMENT_OTHER): Payer: 59 | Admitting: Hematology

## 2019-03-23 ENCOUNTER — Encounter (HOSPITAL_COMMUNITY): Payer: Self-pay | Admitting: Internal Medicine

## 2019-03-23 DIAGNOSIS — C50919 Malignant neoplasm of unspecified site of unspecified female breast: Secondary | ICD-10-CM

## 2019-03-23 DIAGNOSIS — Z5111 Encounter for antineoplastic chemotherapy: Secondary | ICD-10-CM | POA: Diagnosis not present

## 2019-03-23 NOTE — Assessment & Plan Note (Addendum)
1.  Metastatic breast cancer to the liver, ER/PR positive and HER-2 negative: - Abemaciclib and Faslodex from 07/02/2018 through 09/07/2018 with progression. - ERCP and plastic stent in hepatic duct on 09/22/2018 by Dr. Laural Golden. - Paclitaxel 60 mg/m2 started on 09/18/2018.  Last dose was on 02/25/2019. - She does report intermittent right upper quadrant pain on and off, lasting less than 5 minutes. - We reviewed her blood work.  ANC is 1200.  White count is 2.6. -I would hold off on treatment today.  I have made a referral to [place] for change of her biliary stent. -She will continue physical therapy 3 times per week for 6 weeks.  2.  Hypokalemia/hypomagnesemia: -Potassium is 3.2 today.  She will continue potassium supplements at home.  3.  Ascites: -This is from hepatic metastatic disease. -She will continue Lasix 20 mg daily and spironolactone 25 mg daily. -Last paracentesis on 02/12/2019 with 2.3 L removed.  3.  Neuropathy: -She takes gabapentin 300 mg at bedtime which is helping burning sensation in her feet.  4.  Nausea: -She takes Zofran alternating with Compazine.

## 2019-03-23 NOTE — Progress Notes (Signed)
[place], [place] [address]   CLINIC:  Medical Oncology/Hematology  PCP:  Doree Albee, MD [place] [phone]   REASON FOR VISIT:  Follow-up for metastatic breast cancer.   BRIEF ONCOLOGIC HISTORY:  Oncology History  Metastatic breast cancer ([place])  07/02/2018 Initial Diagnosis   Metastatic breast cancer ([place])   09/18/2018 -  Chemotherapy   The patient had palonosetron (ALOXI) injection 0.25 mg, 0.25 mg, Intravenous,  Once, 4 of 5 cycles Administration: 0.25 mg (10/01/2018), 0.25 mg (10/09/2018), 0.25 mg (10/23/2018), 0.25 mg (10/30/2018), 0.25 mg (11/12/2018), 0.25 mg (12/03/2018), 0.25 mg (12/24/2018), 0.25 mg (01/22/2019), 0.25 mg (01/07/2019), 0.25 mg (02/04/2019), 0.25 mg (02/25/2019) PACLitaxel (TAXOL) 120 mg in sodium chloride 0.9 % 250 mL chemo infusion (</= 67m/m2), 60 mg/m2 = 120 mg (100 % of original dose 60 mg/m2), Intravenous,  Once, 4 of 5 cycles Dose modification: 60 mg/m2 (original dose 60 mg/m2, Cycle 1, Reason: Change in LFTs), 80 mg/m2 (original dose 60 mg/m2, Cycle 2, Reason: Provider Judgment), 54 mg/m2 (90 % of original dose 60 mg/m2, Cycle 3, Reason: Other (see comments), Comment: hepatic dysfunction), 40 mg/m2 (66.7 % of original dose 60 mg/m2, Cycle 4, Reason: Other (see comments), Comment: neutropenia) Administration: 120 mg (09/18/2018), 120 mg (10/01/2018), 120 mg (10/23/2018), 120 mg (10/09/2018), 120 mg (10/30/2018), 162 mg (11/12/2018), 162 mg (12/03/2018), 108 mg (12/24/2018), 108 mg (01/22/2019), 108 mg (01/07/2019), 78 mg (02/04/2019), 78 mg (02/25/2019)  for chemotherapy treatment.       CANCER STAGING: Cancer Staging No matching staging information was found for the patient.   INTERVAL HISTORY:  Ms. NStraley89y.o. female seen for follow-up of metastatic breast cancer to the liver.  Her last treatment was on 02/25/2019.  She had stent changed on 03/18/2019.  She had a metal stent placed.  She does  report epigastric pain upon eating.  Denies any vomiting.  Baseline constipation is stable.  Denies any fevers or chills.  She is able to do physical therapy 3 times a week.  She is reporting improvement in energy levels.  REVIEW OF SYSTEMS:  Review of Systems  Gastrointestinal: Positive for constipation.  Neurological: Positive for numbness.  All other systems reviewed and are negative.    PAST MEDICAL/SURGICAL HISTORY:  Past Medical History:  Diagnosis Date  . Anemia   . Anxiety   . Asthma   . Breast cancer ([place]    Left, 2017  . Complication of anesthesia   . Depression   . GERD (gastroesophageal reflux disease)   . Migraine   . OAB (overactive bladder)   . PONV (postoperative nausea and vomiting)   . Seasonal allergies    Past Surgical History:  Procedure Laterality Date  . ABDOMINAL HYSTERECTOMY     breast cancer  . ANKLE RECONSTRUCTION Right 1989  . BILIARY STENT PLACEMENT N/A 09/22/2018   Procedure: BILIARY STENT PLACEMENT;  Surgeon: RRogene Houston MD;  Location[facility];  Service: Endoscopy;  Laterality: N/A;  . BILIARY STENT PLACEMENT N/A 03/16/2019   Procedure: BILIARY STENT EXCHANGE;  Surgeon: RRogene Houston MD;  Location: [facility];  Service: Endoscopy;  Laterality: N/A;  . BREAST SURGERY    . COLONOSCOPY WITH PROPOFOL N/A 08/06/2018   Dr. FOneida Alar small internal hemorrhoids, moderate external hemorrhoids. rectal bleeding due to ?anal fissure vs hemorrhoids. nex tcs 10 years  . ERCP N/A 09/22/2018   Procedure: ENDOSCOPIC RETROGRADE CHOLANGIOPANCREATOGRAPHY (ERCP);  Surgeon:  Rogene Houston, MD;  Location: [facility];  Service: Endoscopy;  Laterality: N/A;  . ERCP N/A 03/16/2019   Procedure: ENDOSCOPIC RETROGRADE CHOLANGIOPANCREATOGRAPHY (ERCP);  Surgeon: Rogene Houston, MD;  Location: [facility];  Service: Endoscopy;  Laterality: N/A;  . FOREIGN BODY REMOVAL N/A 08/29/2017   from lip  . KNEE SURGERY Right 1989   bone spur  . MASTECTOMY  2017    bil mastectomies  . PORTACATH PLACEMENT Left 12/23/2018   Procedure: INSERTION PORT-A-CATH LEFT SUBCLAVIAN;  Surgeon: Aviva Signs, MD;  Location: [facility] ORS;  Service: General;  Laterality: Left;  . SINUSOTOMY       SOCIAL HISTORY:  Social History   Socioeconomic History  . Marital status: Single    Spouse name: Not on file  . Number of children: 1  . Years of education: 52  . Highest education level: Not on file  Occupational History  . Occupation: disabled  Social Needs  . Financial resource strain: Somewhat hard  . Food insecurity    Worry: Sometimes true    Inability: Sometimes true  . Transportation needs    Medical: No    Non-medical: No  Tobacco Use  . Smoking status: Never Smoker  . Smokeless tobacco: Never Used  Substance and Sexual Activity  . Alcohol use: No  . Drug use: No  . Sexual activity: Not Currently  Lifestyle  . Physical activity    Days per week: 0 days    Minutes per session: 0 min  . Stress: Rather much  Relationships  . Social Herbalist on phone: Once a week    Gets together: Once a week    Attends religious service: 1 to 4 times per year    Active member of club or organization: Yes    Attends meetings of clubs or organizations: Never    Relationship status: Never married  . Intimate partner violence    Fear of current or ex partner: No    Emotionally abused: No    Physically abused: No    Forced sexual activity: No  Other Topics Concern  . Not on file  Social History Narrative   Bachelors degree   Accounting   Lives with daughter [name] who has autism and diabetes   Likes to sew, quilt, crafts, crochet    FAMILY HISTORY:  Family History  Problem Relation Age of Onset  . Arthritis Mother   . COPD Mother   . Depression Mother   . Diabetes Mother   . Kidney disease Father   . Heart disease Father 15  . Drug abuse Father   . Hypertension Father   . Diabetes Daughter   . Hashimoto's thyroiditis Daughter   .  Irritable bowel syndrome Daughter   . Autism spectrum disorder Daughter   . Early death Maternal Grandmother        drowned  . Early death Maternal Grandfather   . Heart disease Maternal Grandfather   . Heart disease Paternal Grandfather   . Breast cancer Maternal Aunt   . Breast cancer Cousin   . Lung cancer Maternal Aunt   . Lung cancer Maternal Uncle   . Colon cancer Neg Hx     CURRENT MEDICATIONS:  Outpatient Encounter Medications as of 03/23/2019  Medication Sig  . escitalopram (LEXAPRO) 20 MG tablet Take 1 tablet (20 mg total) by mouth daily.  . furosemide (LASIX) 20 MG tablet TAKE 1 TABLET BY MOUTH EVERY DAY  . gabapentin (NEURONTIN)  300 MG capsule Take 1 capsule (300 mg total) by mouth at bedtime.  Marland Kitchen lubiprostone (AMITIZA) 8 MCG capsule Take 1 capsule (8 mcg total) by mouth 2 (two) times daily with a meal.  . magnesium oxide (MAG-OX) 400 MG tablet TAKE 1 TABLET BY MOUTH EVERY DAY  . PACLitaxel (TAXOL IV) Inject into the vein every 14 (fourteen) days.   . pantoprazole (PROTONIX) 40 MG tablet TAKE 1 TABLET BY MOUTH EVERY DAY BEFORE BREAKFAST  . potassium chloride (KLOR-CON) 10 MEQ tablet Take 10 mEq by mouth 2 (two) times daily.  Marland Kitchen spironolactone (ALDACTONE) 25 MG tablet TAKE 1 TABLET BY MOUTH EVERY DAY  . temazepam (RESTORIL) 15 MG capsule TAKE 1 CAPSULE (15 MG TOTAL) BY MOUTH AT BEDTIME AS NEEDED FOR SLEEP. (Patient taking differently: Take 15 mg by mouth at bedtime. )  . magic mouthwash w/lidocaine SOLN Take 5 mLs by mouth 4 (four) times daily as needed for mouth pain. (Patient not taking: Reported on 03/23/2019)  . nystatin-triamcinolone (MYCOLOG II) cream Apply 1 application topically 2 (two) times daily.   Marland Kitchen oxyCODONE-acetaminophen (PERCOCET/ROXICET) 5-325 MG tablet Take 1 tablet by mouth every 4 (four) hours as needed for severe pain.  . Polyvinyl Alcohol-Povidone PF (REFRESH) 1.4-0.6 % SOLN Place 1 drop into both eyes as needed (for dry eyes).   . rizatriptan (MAXALT-MLT)  10 MG disintegrating tablet Take 10 mg by mouth every 2 (two) hours as needed for migraine.   Marland Kitchen scopolamine (TRANSDERM-SCOP) 1 MG/3DAYS Place 1 patch (1.5 mg total) onto the skin every 3 (three) days. (Patient not taking: Reported on 03/23/2019)   No facility-administered encounter medications on file as of 03/23/2019.     ALLERGIES:  Allergies  Allergen Reactions  . Salagen [Pilocarpine] Other (See Comments)    Can cause liver failure   . Tape Itching and Other (See Comments)    Depending on the adhesive-blistering occurs  . Amoxicillin Hives and Other (See Comments)    Rash only DID THE REACTION INVOLVE: Swelling of the face/tongue/throat, SOB, or low BP? Sudden or severe rash/hives, skin peeling, or the inside of the mouth or nose?  Did it require medical treatment?  When did it last happen? If all above answers are "NO", may proceed with cephalosporin use.   . Caffeine Diarrhea, Nausea Only, Palpitations and Other (See Comments)    Headache  . Tetanus Toxoids Swelling and Other (See Comments)    Local reaction     PHYSICAL EXAM:  ECOG Performance status: 1 Blood pressure is 105/87.  Pulse rate is 120.  Respirations 18.  Temperature 98.  Saturations are 95%.  Physical Exam Vitals signs reviewed.  Constitutional:      Appearance: Normal appearance.  Cardiovascular:     Rate and Rhythm: Normal rate and regular rhythm.     Heart sounds: Normal heart sounds.  Pulmonary:     Effort: Pulmonary effort is normal.     Breath sounds: Normal breath sounds.  Abdominal:     General: There is no distension.     Palpations: Abdomen is soft. There is no mass.  Musculoskeletal:        General: No swelling.  Skin:    General: Skin is warm.  Neurological:     General: No focal deficit present.     Mental Status: She is alert and oriented to person, place, and time.  Psychiatric:        Mood and Affect: Mood normal.        Behavior:  Behavior normal.    Bilateral  mastectomy sites are within normal limits.  LABORATORY DATA:  I have reviewed the labs as listed.  CBC    Component Value Date/Time   WBC 2.6 (L) 03/11/2019 0843   RBC 3.68 (L) 03/11/2019 0843   HGB 9.5 (L) 03/11/2019 0843   HCT 31.5 (L) 03/11/2019 0843   PLT 183 03/11/2019 0843   MCV 85.6 03/11/2019 0843   MCH 25.8 (L) 03/11/2019 0843   MCHC 30.2 03/11/2019 0843   RDW 19.5 (H) 03/11/2019 0843   LYMPHSABS 0.7 03/11/2019 0843   MONOABS 0.6 03/11/2019 0843   EOSABS 0.1 03/11/2019 0843   BASOSABS 0.0 03/11/2019 0843   CMP Latest Ref Rng & Units 03/11/2019 02/25/2019 02/18/2019  Glucose 70 - 99 mg/dL 114(H) 112(H) 128(H)  BUN 6 - 20 mg/dL _0 Creatinine 0.44 - 1.00 mg/dL 0.46 0.57 0.53  Sodium 135 - 145 mmol/L 139 137 137  Potassium 3.5 - 5.1 mmol/L 3.2(L) 2.9(L) 3.2(L)  Chloride 98 - 111 mmol/L 105 100 105  CO2 22 - 32 mmol/L _1 Calcium 8.9 - 10.3 mg/dL 8.7(L) 8.4(L) 8.3(L)  Total Protein 6.5 - 8.1 g/dL 6.0(L) 6.0(L) 5.8(L)  Total Bilirubin 0.3 - 1.2 mg/dL 1.0 0.9 1.1  Alkaline Phos 38 - 126 U/L 185(H) 195(H) 174(H)  AST 15 - 41 U/L 56(H) 54(H) 45(H)  ALT 0 - 44 U/L _2 DIAGNOSTIC IMAGING:  I have independently reviewed the scans and discussed with the patient.   I have reviewed Venita Lick LPN's note and agree with the documentation.  I personally performed a face-to-face visit, made revisions and my assessment and plan is as follows.    ASSESSMENT & PLAN:   Metastatic breast cancer ([place]) 1.  Metastatic breast cancer to the liver, ER/PR positive and HER-2 negative: -Venous equipment Faslodex from 07/02/2018 through 09/07/2018 with progression. -ERCP and plastic stent placement in the hepatic duct on 09/22/2018. -Paclitaxel 60 mg per metered square started on 09/18/2018.  Last dose on 02/25/2019. -She underwent metal stent placement on 03/18/2019. -Last CT of the abdomen and pelvis on 01/28/2019 showed stable hepatic metastatic disease 12/29/2018.  -She does report some pain in the abdominal region after eating.  Likely stent related.  Denies any nausea or vomiting. - She is continuing physical therapy 3 times per week, which is helping with her energy levels. -I plan to repeat her labs on Thursday for her next treatment.  I will see her back in 2 weeks for follow-up. -We plan to repeat scan around 04/30/2019.  2.  Hypokalemia/hypomagnesemia: - She will continue potassium and magnesium supplements at home.  We will review labs on Thursday.  3.  Ascites: -This is from hepatic metastatic disease. -She will continue Lasix and spironolactone. -Last paracentesis on 02/12/2019 with 2.3 L removed.  There is not enough fluid today on examination.  4.  Neuropathy: -We will continue gabapentin 300 mg at bedtime.  Burning sensation is well controlled.  5.  Nausea: -She will continue Zofran alternating with Compazine.  Total time spent is 25 minutes with more than 50% of the time spent face-to-face discussing labs, counseling and coordination of care.  Orders placed this encounter:  No orders of the defined types were placed in this encounter.     Derek Jack, MD [place] [phone]

## 2019-03-23 NOTE — Assessment & Plan Note (Signed)
1.  Metastatic breast cancer to the liver, ER/PR positive and HER-2 negative: -Venous equipment Faslodex from 07/02/2018 through 09/07/2018 with progression. -ERCP and plastic stent placement in the hepatic duct on 09/22/2018. -Paclitaxel 60 mg per metered square started on 09/18/2018.  Last dose on 02/25/2019. -She underwent metal stent placement on 03/18/2019. -Last CT of the abdomen and pelvis on 01/28/2019 showed stable hepatic metastatic disease 12/29/2018. -She does report some pain in the abdominal region after eating.  Likely stent related.  Denies any nausea or vomiting. - She is continuing physical therapy 3 times per week, which is helping with her energy levels. -I plan to repeat her labs on Thursday for her next treatment.  I will see her back in 2 weeks for follow-up. -We plan to repeat scan around 04/30/2019.  2.  Hypokalemia/hypomagnesemia: - She will continue potassium and magnesium supplements at home.  We will review labs on Thursday.  3.  Ascites: -This is from hepatic metastatic disease. -She will continue Lasix and spironolactone. -Last paracentesis on 02/12/2019 with 2.3 L removed.  There is not enough fluid today on examination.  4.  Neuropathy: -We will continue gabapentin 300 mg at bedtime.  Burning sensation is well controlled.  5.  Nausea: -She will continue Zofran alternating with Compazine.

## 2019-03-23 NOTE — Patient Instructions (Signed)
[place] at [facility] Discharge Instructions  You were seen today by Dr. [name]. He went over how your surgery went and how you've been feeling since. He will see you back in 2 weeks for labs and follow up.   Thank you for choosing [place] at [facility] to provide your oncology and hematology care.  To afford each patient quality time with our provider, please arrive at least 15 minutes before your scheduled appointment time.   If you have a lab appointment with the [facility] please come in thru the  Main Entrance and check in at the main information desk  You need to re-schedule your appointment should you arrive 10 or more minutes late.  We strive to give you quality time with our providers, and arriving late affects you and other patients whose appointments are after yours.  Also, if you no show three or more times for appointments you may be dismissed from the clinic at the providers discretion.     Again, thank you for choosing [facility].  Our hope is that these requests will decrease the amount of time that you wait before being seen by our physicians.       _____________________________________________________________  Should you have questions after your visit to [facility], please contact our office at [phone] between the hours of 8:00 a.m. and 4:30 p.m.  Voicemails left after 4:00 p.m. will not be returned until the following business day.  For prescription refill requests, have your pharmacy contact our office and allow 72 hours.    Cancer Center Support Programs:   > Cancer Support Group  2nd Tuesday of the month 1pm-2pm, Journey Room

## 2019-03-24 ENCOUNTER — Other Ambulatory Visit: Payer: Self-pay

## 2019-03-24 ENCOUNTER — Ambulatory Visit (HOSPITAL_COMMUNITY): Payer: 59 | Admitting: Physical Therapy

## 2019-03-24 DIAGNOSIS — R2689 Other abnormalities of gait and mobility: Secondary | ICD-10-CM

## 2019-03-24 DIAGNOSIS — M6281 Muscle weakness (generalized): Secondary | ICD-10-CM

## 2019-03-24 NOTE — Therapy (Signed)
[place], [place], [address] Phone: [phone]   Fax:  [phone]  Physical Therapy Treatment  Patient Details  Name: Pamela Long MRN: [id number] Date of Birth: 10/09/1970 Referring Provider (PT): Derek Jack   Encounter Date: 03/24/2019  PT End of Session - 03/24/19 1643    Visit Number  5    Number of Visits  18    Date for PT Re-Evaluation  04/14/19    PT Start Time  [id number]    PT Stop Time  1615    PT Time Calculation (min)  45 min    Equipment Utilized During Treatment  Gait belt    Activity Tolerance  Patient limited by fatigue    Behavior During Therapy  [facility] for tasks assessed/performed       Past Medical History:  Diagnosis Date  . Anemia   . Anxiety   . Asthma   . Breast cancer ([place])    Left, 2017  . Complication of anesthesia   . Depression   . GERD (gastroesophageal reflux disease)   . Migraine   . OAB (overactive bladder)   . PONV (postoperative nausea and vomiting)   . Seasonal allergies     Past Surgical History:  Procedure Laterality Date  . ABDOMINAL HYSTERECTOMY     breast cancer  . ANKLE RECONSTRUCTION Right 1989  . BILIARY STENT PLACEMENT N/A 09/22/2018   Procedure: BILIARY STENT PLACEMENT;  Surgeon: Rogene Houston, MD;  [facility];  Service: Endoscopy;  Laterality: N/A;  . BILIARY STENT PLACEMENT N/A 03/16/2019   Procedure: BILIARY STENT EXCHANGE;  Surgeon: Rogene Houston, MD;  Location: [facility];  Service: Endoscopy;  Laterality: N/A;  . BREAST SURGERY    . COLONOSCOPY WITH PROPOFOL N/A 08/06/2018   Dr. Oneida Alar: small internal hemorrhoids, moderate external hemorrhoids. rectal bleeding due to ?anal fissure vs hemorrhoids. nex tcs 10 years  . ERCP N/A 09/22/2018   Procedure: ENDOSCOPIC RETROGRADE CHOLANGIOPANCREATOGRAPHY (ERCP);  Surgeon: Rogene Houston, MD;  Location: [facility];  Service: Endoscopy;  Laterality: N/A;  . ERCP N/A 03/16/2019    Procedure: ENDOSCOPIC RETROGRADE CHOLANGIOPANCREATOGRAPHY (ERCP);  Surgeon: Rogene Houston, MD;  [facility]: [facility];  Service: Endoscopy;  Laterality: N/A;  . FOREIGN BODY REMOVAL N/A 08/29/2017   from lip  . KNEE SURGERY Right 1989   bone spur  . MASTECTOMY  2017   bil mastectomies  . PORTACATH PLACEMENT Left 12/23/2018   Procedure: INSERTION PORT-A-CATH LEFT SUBCLAVIAN;  Surgeon: Aviva Signs, MD;  Location: [facility] ORS;  Service: General;  Laterality: Left;  . SINUSOTOMY      There were no vitals filed for this visit.  Subjective Assessment - 03/24/19 1538    Subjective  pt states she is getting better with activity tolerance.    Currently in Pain?  No/denies                       [facility] Adult PT Treatment/Exercise - 03/24/19 0001      Lumbar Exercises: Stretches   Other Lumbar Stretch Exercise  3 D hip excursion 5 reps each    Other Lumbar Stretch Exercise  thoracic excursions x 5 each with UE movements      Lumbar Exercises: Machines for Strengthening   Leg Press  30#, 2X 10 reps      Lumbar Exercises: Standing   Heel Raises  10 reps    Heel Raises  Limitations  toe raises, 10 res    Functional Squats  10 reps    Forward Lunge  10 reps    Forward Lunge Limitations  BLE, // bars for support    Scapular Retraction  Strengthening;Both;Theraband;15 reps    Theraband Level (Scapular Retraction)  Level 3 (Green)    Row  Strengthening;Both;Theraband;15 reps    Theraband Level (Row)  Level 3 (Green)    Shoulder Extension  Strengthening;Both;15 reps    Theraband Level (Shoulder Extension)  Level 3 (Green)    Other Standing Lumbar Exercises  PNF D1,D2 x 15 green tband       Lumbar Exercises: Seated   Sit to Stand  10 reps          Balance Exercises - 03/24/19 1602      Balance Exercises: Standing   Tandem Gait  Forward    Step Over Hurdles / Cones  4 6" hurdles, 2RT    Other Standing Exercises  tandem stance BUE flexion 2# bar 10 reps each LE lead           PT Short Term Goals - 03/05/19 1120      PT SHORT TERM GOAL #1   Title  PT core and LE strength to be increased by one grade to allow pt to be able to complete her housework without having to rest.    Time  3    Period  Weeks    Status  On-going    [employer] Date  03/24/19      PT SHORT TERM GOAL #2   Title  PT to be able to walk for 15 minutes at a time to be able to complete short shopping trips with confidence.    Time  3    Period  Weeks    Status  On-going      PT SHORT TERM GOAL #3   Title  PT to be able to single leg stance for 30 seconds on both LE to reduce risk of falling.    Time  3    Period  Weeks    Status  On-going        PT Long Term Goals - 03/05/19 1121      PT LONG TERM GOAL #1   Title  PT core and LE strength to be increased by one grade to allow pt to carry groceries up her steps into her house without difficulty.    Time  6    Period  Weeks    Status  On-going      PT LONG TERM GOAL #2   Title  PT to be able to single leg stance for at least 45 seconds bilaterally to be able to be confident walking on uneven terrain.    Time  6    Period  Weeks    Status  On-going      PT LONG TERM GOAL #3   Title  P  pain to be no greater than 3/10 to be able to complete her normal activities for daily living in greater comfort.    Time  6    Period  Weeks    Status  On-going            Plan - 03/24/19 1740    Clinical Impression Statement  contiued with LE strengthening, core stab and stability.  Pt able to complete all therex and displayed overall good activity tolerance requiring only 2 seated rest breaks.  No LOB today  with any activities.  Increased reps of theraband exercises and added additional set of leg press strengthening.  Cues needed for general form only.    Personal Factors and Comorbidities  Comorbidity 1    Comorbidities  metastatic cancer    Examination-Activity Limitations  Carry;Lift;Locomotion Level;Squat;Stairs;Stand     Examination-Participation Restrictions  Cleaning;Community Activity;Laundry;Meal Prep;Shop;Other;Yard Work    [employer]  Evolving/Moderate complexity    Rehab Potential  Good    PT Frequency  3x / week    PT Duration  6 weeks    PT Treatment/Interventions  ADLs/Self Care Home Management;Gait training;Stair training;Therapeutic activities;Therapeutic exercise;Balance training;Patient/family education    PT Next Visit Plan  progress per activity tolerance.  Continue BLE strengthening and balance exercises within pt tolerance.    PT Home Exercise Plan  sit to stand, heel raises, single leg stance and walking program.    Consulted and Agree with Plan of Care  Patient       Patient will benefit from skilled therapeutic intervention in order to improve the following deficits and impairments:  Abnormal gait, Cardiopulmonary status limiting activity, Decreased endurance, Decreased activity tolerance, Decreased balance, Decreased strength, Difficulty walking, Pain  Visit Diagnosis: Muscle weakness (generalized)  Other abnormalities of gait and mobility     Problem List Patient Active Problem List   Diagnosis Date Noted  . 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 RUQ abdominal pain 03/11/2019  . Colitis 01/28/2019  . Chemotherapy-induced neutropenia ([place])   . Dehydration   . Lactic acid acidosis   . Nausea vomiting and diarrhea   . Status post thoracentesis   . Palliative care by specialist   . Community acquired pneumonia of left lung   . Acute on chronic respiratory failure with hypoxia ([place]) 12/13/2018  . Sepsis due to undetermined organism ([place]) 12/13/2018  . Pleural effusion on left   . HCAP (healthcare-associated pneumonia) 12/12/2018  . Febrile neutropenia ([place]) 12/12/2018  . Hypokalemia 12/12/2018  . Hyponatremia 12/12/2018  . Severe protein-calorie malnutrition ([place]) 12/12/2018  . Sepsis ([place]) 12/12/2018  . Obstructive jaundice 09/21/2018  . Goals of  care, counseling/discussion 09/17/2018  . Abdominal pain 09/09/2018  . RUQ pain 07/08/2018  . Metastatic breast cancer ([place]) 07/02/2018  . Liver mass 06/27/2018  . GERD (gastroesophageal reflux disease) 04/02/2018  . Rectal bleeding 04/02/2018  . Constipation 04/02/2018  . Acquired absence of left breast 08/01/2017  . OAB (overactive bladder) 07/31/2017  . Vitamin D deficiency 02/26/2017  . Prediabetes 02/26/2017  . HLD (hyperlipidemia) 02/26/2017  . Asthma 02/12/2017  . Depression 02/12/2017  . Migraines 02/12/2017  . Arthralgia 02/12/2017  . Sleep-disordered breathing 02/12/2017  . Positive ANA (antinuclear antibody) 02/12/2017  . Angular cheilitis 02/12/2017  . Class 2 obesity due to excess calories without serious comorbidity with body mass index (BMI) of 35.0 to 35.9 in adult 02/12/2017  . Malignant neoplasm of central portion of left breast in female, estrogen receptor positive ([place]) 01/05/2017   Teena Irani, PTA/CLT [phone]  Teena Irani 03/24/2019, 5:44 PM  [facility] [address] [place], [place], [address] Phone: [phone]   Fax:  [phone]  Name: Pamela Long MRN: [id number] Date of Birth: 27-Jul-1970

## 2019-03-25 ENCOUNTER — Other Ambulatory Visit (HOSPITAL_COMMUNITY): Payer: Self-pay | Admitting: Nurse Practitioner

## 2019-03-25 ENCOUNTER — Encounter (HOSPITAL_COMMUNITY): Payer: Self-pay

## 2019-03-25 ENCOUNTER — Other Ambulatory Visit (HOSPITAL_COMMUNITY): Payer: Self-pay | Admitting: Hematology

## 2019-03-25 ENCOUNTER — Inpatient Hospital Stay (HOSPITAL_COMMUNITY): Payer: 59

## 2019-03-25 VITALS — BP 111/72 | HR 102 | Temp 97.7°F | Resp 18 | Wt 173.6 lb

## 2019-03-25 DIAGNOSIS — E876 Hypokalemia: Secondary | ICD-10-CM

## 2019-03-25 DIAGNOSIS — C50919 Malignant neoplasm of unspecified site of unspecified female breast: Secondary | ICD-10-CM

## 2019-03-25 DIAGNOSIS — Z5111 Encounter for antineoplastic chemotherapy: Secondary | ICD-10-CM | POA: Diagnosis not present

## 2019-03-25 LAB — CBC WITH DIFFERENTIAL/PLATELET
Abs Immature Granulocytes: 0.02 10*3/uL (ref 0.00–0.07)
Basophils Absolute: 0 10*3/uL (ref 0.0–0.1)
Basophils Relative: 1 %
Eosinophils Absolute: 0.2 10*3/uL (ref 0.0–0.5)
Eosinophils Relative: 4 %
HCT: 32.6 % — ABNORMAL LOW (ref 36.0–46.0)
Hemoglobin: 10.2 g/dL — ABNORMAL LOW (ref 12.0–15.0)
Immature Granulocytes: 0 %
Lymphocytes Relative: 11 %
Lymphs Abs: 0.6 10*3/uL — ABNORMAL LOW (ref 0.7–4.0)
MCH: 26.2 pg (ref 26.0–34.0)
MCHC: 31.3 g/dL (ref 30.0–36.0)
MCV: 83.8 fL (ref 80.0–100.0)
Monocytes Absolute: 0.5 10*3/uL (ref 0.1–1.0)
Monocytes Relative: 10 %
Neutro Abs: 3.8 10*3/uL (ref 1.7–7.7)
Neutrophils Relative %: 74 %
Platelets: 173 10*3/uL (ref 150–400)
RBC: 3.89 MIL/uL (ref 3.87–5.11)
RDW: 19.5 % — ABNORMAL HIGH (ref 11.5–15.5)
WBC: 5.1 10*3/uL (ref 4.0–10.5)
nRBC: 0 % (ref 0.0–0.2)

## 2019-03-25 LAB — COMPREHENSIVE METABOLIC PANEL
ALT: 39 U/L (ref 0–44)
AST: 100 U/L — ABNORMAL HIGH (ref 15–41)
Albumin: 2.5 g/dL — ABNORMAL LOW (ref 3.5–5.0)
Alkaline Phosphatase: 238 U/L — ABNORMAL HIGH (ref 38–126)
Anion gap: 10 (ref 5–15)
BUN: 7 mg/dL (ref 6–20)
CO2: 26 mmol/L (ref 22–32)
Calcium: 8.6 mg/dL — ABNORMAL LOW (ref 8.9–10.3)
Chloride: 101 mmol/L (ref 98–111)
Creatinine, Ser: 0.66 mg/dL (ref 0.44–1.00)
GFR calc Af Amer: >60 mL/min (ref >60)
GFR calc non Af Amer: >60 mL/min (ref >60)
Glucose, Bld: 175 mg/dL — ABNORMAL HIGH (ref 70–99)
Potassium: 2.9 mmol/L — ABNORMAL LOW (ref 3.5–5.1)
Sodium: 137 mmol/L (ref 135–145)
Total Bilirubin: 1.1 mg/dL (ref 0.3–1.2)
Total Protein: 6 g/dL — ABNORMAL LOW (ref 6.5–8.1)

## 2019-03-25 MED ORDER — SODIUM CHLORIDE 0.9 % IV SOLN
10.0000 mg | Freq: Once | INTRAVENOUS | Status: AC
  Administered 2019-03-25: 13:00:00 10 mg via INTRAVENOUS
  Filled 2019-03-25: qty 10

## 2019-03-25 MED ORDER — POTASSIUM CHLORIDE IN NACL 40-0.9 MEQ/L-% IV SOLN
Freq: Once | INTRAVENOUS | Status: AC
  Administered 2019-03-25: 12:00:00 250 mL/h via INTRAVENOUS
  Filled 2019-03-25: qty 1000

## 2019-03-25 MED ORDER — FAMOTIDINE IN NACL 20-0.9 MG/50ML-% IV SOLN
20.0000 mg | Freq: Once | INTRAVENOUS | Status: AC
  Administered 2019-03-25: 12:00:00 20 mg via INTRAVENOUS
  Filled 2019-03-25: qty 50

## 2019-03-25 MED ORDER — SODIUM CHLORIDE 0.9% FLUSH: 10.0000 mL | INTRAVENOUS | Status: AC | PRN

## 2019-03-25 MED ORDER — SODIUM CHLORIDE 0.9 % IV SOLN
Freq: Once | INTRAVENOUS | Status: AC
  Administered 2019-03-25: 12:00:00 via INTRAVENOUS

## 2019-03-25 MED ORDER — OXYCODONE-ACETAMINOPHEN 5-325 MG PO TABS: 1.0000 | ORAL_TABLET | ORAL | 0 refills | Status: DC | PRN

## 2019-03-25 MED ORDER — DIPHENHYDRAMINE HCL 50 MG/ML IJ SOLN
25.0000 mg | Freq: Once | INTRAMUSCULAR | Status: AC
  Administered 2019-03-25: 25 mg via INTRAVENOUS
  Filled 2019-03-25: qty 1

## 2019-03-25 MED ORDER — SODIUM CHLORIDE 0.9 % IV SOLN
40.0000 mg/m2 | Freq: Once | INTRAVENOUS | Status: AC
  Administered 2019-03-25: 14:00:00 78 mg via INTRAVENOUS
  Filled 2019-03-25: qty 13

## 2019-03-25 MED ORDER — TRAMADOL HCL 50 MG PO TABS: 50.0000 mg | ORAL_TABLET | Freq: Two times a day (BID) | ORAL | 0 refills | Status: DC | PRN

## 2019-03-25 MED ORDER — PALONOSETRON HCL INJECTION 0.25 MG/5ML
0.2500 mg | Freq: Once | INTRAVENOUS | Status: AC
  Administered 2019-03-25: 12:00:00 0.25 mg via INTRAVENOUS
  Filled 2019-03-25: qty 5

## 2019-03-25 MED ORDER — POTASSIUM CHLORIDE CRYS ER 10 MEQ PO TBCR
20.0000 meq | EXTENDED_RELEASE_TABLET | ORAL | Status: AC
  Administered 2019-03-25 (×2): 20 meq via ORAL
  Filled 2019-03-25 (×2): qty 2

## 2019-03-25 MED ORDER — HEPARIN SOD (PORK) LOCK FLUSH 100 UNIT/ML IV SOLN
500.0000 [IU] | Freq: Once | INTRAVENOUS | Status: AC | PRN
  Administered 2019-03-25: 16:00:00 500 [IU]

## 2019-03-25 NOTE — Progress Notes (Unsigned)
Patient tolerated chemotherapy and hydration with no complaints voiced.  Port site clean and dry with no bruising or swelling noted at site.  Good blood return noted before and after administration of chemotherapy.  Band aid applied.  Patient left ambulatory with VSS and no s/s of distress noted.

## 2019-03-25 NOTE — Progress Notes (Unsigned)
Pt presents today for treatment only. VS within parameters for tx. MAR reviewed . Pt has no complaints of any changes since the last visit. Pt denies any pain today. Labs pending. Pt requests refills on Percocet/Roxicet 5/325mg  and Tramadol.

## 2019-03-25 NOTE — Progress Notes (Unsigned)
03/25/19  K+ 2.9   Received orders to give:  1 liter NS + 40 meq potassium chloride IV x 1 over 4 hours and Potassium chloride 20 meq orally q 1 hour x 2 doses  Chemo to stay at 40 mg/m2 for today - per Dr Rhys Martini, PharmD  T.O. Francene Finders NP-C/Evanna [place] Ronnald Ramp PharmD

## 2019-03-26 ENCOUNTER — Ambulatory Visit (HOSPITAL_COMMUNITY): Payer: 59

## 2019-03-26 ENCOUNTER — Telehealth (HOSPITAL_COMMUNITY): Payer: Self-pay

## 2019-03-26 NOTE — Telephone Encounter (Signed)
pt called to cancel this appt due to she is sick from chemo yesterday

## 2019-03-29 ENCOUNTER — Ambulatory Visit (HOSPITAL_COMMUNITY): Payer: 59

## 2019-03-29 ENCOUNTER — Telehealth (HOSPITAL_COMMUNITY): Payer: Self-pay

## 2019-03-29 NOTE — Telephone Encounter (Signed)
pt called to cancel ltoday's  due to she is still sick from chemo.

## 2019-03-31 ENCOUNTER — Telehealth (HOSPITAL_COMMUNITY): Payer: Self-pay | Admitting: Physical Therapy

## 2019-03-31 ENCOUNTER — Ambulatory Visit (HOSPITAL_COMMUNITY): Payer: 59 | Admitting: Physical Therapy

## 2019-03-31 NOTE — Telephone Encounter (Signed)
Called patient about missed appointment. She said she called and left a VM about cancelling her appointment. Reminded her of next apt on Friday.  4:09 PM, 03/31/19 Jerene Pitch, DPT Physical Therapy with [facility]  [phone] office

## 2019-04-02 ENCOUNTER — Ambulatory Visit (HOSPITAL_COMMUNITY): Payer: 59

## 2019-04-05 ENCOUNTER — Encounter (HOSPITAL_COMMUNITY): Payer: Self-pay | Admitting: Hematology

## 2019-04-05 ENCOUNTER — Ambulatory Visit (HOSPITAL_COMMUNITY): Payer: 59 | Admitting: Physical Therapy

## 2019-04-05 ENCOUNTER — Telehealth (HOSPITAL_COMMUNITY): Payer: Self-pay | Admitting: Physical Therapy

## 2019-04-05 ENCOUNTER — Inpatient Hospital Stay (HOSPITAL_BASED_OUTPATIENT_CLINIC_OR_DEPARTMENT_OTHER): Payer: 59 | Admitting: Hematology

## 2019-04-05 ENCOUNTER — Telehealth (HOSPITAL_COMMUNITY): Payer: Self-pay | Admitting: Surgery

## 2019-04-05 ENCOUNTER — Other Ambulatory Visit: Payer: Self-pay

## 2019-04-05 VITALS — BP 125/59 | HR 101 | Temp 97.8°F | Resp 18 | Wt 163.7 lb

## 2019-04-05 DIAGNOSIS — C50919 Malignant neoplasm of unspecified site of unspecified female breast: Secondary | ICD-10-CM

## 2019-04-05 DIAGNOSIS — C50112 Malignant neoplasm of central portion of left female breast: Secondary | ICD-10-CM | POA: Diagnosis not present

## 2019-04-05 DIAGNOSIS — Z170 Estrogen receptor positive status [ER+]: Secondary | ICD-10-CM | POA: Diagnosis not present

## 2019-04-05 DIAGNOSIS — Z5111 Encounter for antineoplastic chemotherapy: Secondary | ICD-10-CM | POA: Diagnosis not present

## 2019-04-05 LAB — CBC WITH DIFFERENTIAL/PLATELET
Abs Immature Granulocytes: 0 10*3/uL (ref 0.00–0.07)
Basophils Absolute: 0 10*3/uL (ref 0.0–0.1)
Basophils Relative: 1 %
Eosinophils Absolute: 0.1 10*3/uL (ref 0.0–0.5)
Eosinophils Relative: 2 %
HCT: 35.5 % — ABNORMAL LOW (ref 36.0–46.0)
Hemoglobin: 11.2 g/dL — ABNORMAL LOW (ref 12.0–15.0)
Immature Granulocytes: 0 %
Lymphocytes Relative: 17 %
Lymphs Abs: 0.5 10*3/uL — ABNORMAL LOW (ref 0.7–4.0)
MCH: 26 pg (ref 26.0–34.0)
MCHC: 31.5 g/dL (ref 30.0–36.0)
MCV: 82.4 fL (ref 80.0–100.0)
Monocytes Absolute: 0.4 10*3/uL (ref 0.1–1.0)
Monocytes Relative: 14 %
Neutro Abs: 1.9 10*3/uL (ref 1.7–7.7)
Neutrophils Relative %: 66 %
Platelets: 220 10*3/uL (ref 150–400)
RBC: 4.31 MIL/uL (ref 3.87–5.11)
RDW: 18.6 % — ABNORMAL HIGH (ref 11.5–15.5)
WBC: 2.9 10*3/uL — ABNORMAL LOW (ref 4.0–10.5)
nRBC: 0 % (ref 0.0–0.2)

## 2019-04-05 LAB — COMPREHENSIVE METABOLIC PANEL
ALT: 40 U/L (ref 0–44)
AST: 100 U/L — ABNORMAL HIGH (ref 15–41)
Albumin: 2.9 g/dL — ABNORMAL LOW (ref 3.5–5.0)
Alkaline Phosphatase: 270 U/L — ABNORMAL HIGH (ref 38–126)
Anion gap: 11 (ref 5–15)
BUN: 8 mg/dL (ref 6–20)
CO2: 25 mmol/L (ref 22–32)
Calcium: 9 mg/dL (ref 8.9–10.3)
Chloride: 101 mmol/L (ref 98–111)
Creatinine, Ser: 0.56 mg/dL (ref 0.44–1.00)
GFR calc Af Amer: >60 mL/min (ref >60)
GFR calc non Af Amer: >60 mL/min (ref >60)
Glucose, Bld: 99 mg/dL (ref 70–99)
Potassium: 3.1 mmol/L — ABNORMAL LOW (ref 3.5–5.1)
Sodium: 137 mmol/L (ref 135–145)
Total Bilirubin: 1.6 mg/dL — ABNORMAL HIGH (ref 0.3–1.2)
Total Protein: 6.6 g/dL (ref 6.5–8.1)

## 2019-04-05 LAB — MAGNESIUM: Magnesium: 1.5 mg/dL — ABNORMAL LOW (ref 1.7–2.4)

## 2019-04-05 MED ORDER — SODIUM CHLORIDE 0.9 % IV SOLN
8.0000 mg | Freq: Once | INTRAVENOUS | Status: DC
  Filled 2019-04-05: qty 4

## 2019-04-05 MED ORDER — HEPARIN SOD (PORK) LOCK FLUSH 100 UNIT/ML IV SOLN
500.0000 [IU] | Freq: Once | INTRAVENOUS | Status: AC
  Administered 2019-04-05: 500 [IU] via INTRAVENOUS

## 2019-04-05 MED ORDER — SODIUM CHLORIDE 0.9 % IV SOLN
8.0000 mg | Freq: Once | INTRAVENOUS | Status: AC
  Administered 2019-04-05: 8 mg via INTRAVENOUS
  Filled 2019-04-05: qty 4

## 2019-04-05 MED ORDER — SODIUM CHLORIDE 0.9% FLUSH
10.0000 mL | Freq: Once | INTRAVENOUS | Status: AC
  Administered 2019-04-05: 10 mL via INTRAVENOUS

## 2019-04-05 MED ORDER — SODIUM CHLORIDE 0.9 % IV SOLN
Freq: Once | INTRAVENOUS | Status: AC
  Administered 2019-04-05: 12:00:00 via INTRAVENOUS
  Filled 2019-04-05: qty 1000

## 2019-04-05 NOTE — Progress Notes (Signed)
Pamela Long tolerated IV hydration well without complaints or incident. VSS upon discharge. Pt discharged self ambulatory in stable condition

## 2019-04-05 NOTE — Assessment & Plan Note (Addendum)
1.  Metastatic breast cancer to the liver, ER/PR positive and HER-2 negative: -Venous equipment Faslodex from 07/02/2018 through 09/07/2018 with progression. -ERCP and plastic stent placement in the hepatic duct on 09/22/2018. -Paclitaxel 60 mg per metered square started on 09/18/2018.  -She underwent metal stent placement on 03/18/2019. -Last CT of the abdomen and pelvis on 01/28/2019 showed stable hepatic metastatic disease 12/29/2018. -Last chemotherapy was on 03/25/2019 at 40 mg per metered square. -She had nausea which lasted for days after chemotherapy.  She vomited twice last week.  She did try the scopolamine patch which did not help.  She was taking Zofran 8 mg twice daily which also stopped working.  She started taking Phenergan 25 mg this morning. -We have reviewed her labs.  She will be given IV hydration.  We will also give IV Zofran. She will be reevaluated in 1 week.  We plan to repeat her treatment in 2 weeks.  2.  Hypokalemia/hypomagnesemia: -She has not taken any pills since last 1 week.  Hence potassium and magnesium is low today.  3.  Ascites: -This is from hepatic metastatic disease. -She stopped taking Lasix and spironolactone last week. -Last paracentesis on 02/12/2019 with 2.3 L removed.    4.  Neuropathy: -We will continue gabapentin 300 mg at bedtime.  Burning sensation is well controlled.  5.  Abdominal pain: -Her usual pain is in the right upper quadrant area.  However she got some pain in the epigastric region since last week.  She has been taking oxycodone 1-2 times per day. -If the pain does not subside at next visit, will consider imaging.

## 2019-04-05 NOTE — Telephone Encounter (Signed)
Pt states that she just got back from MD due to being ill from chemo.  She realized she missed but plans to be here on Wed.   Rayetta Humphrey, [place] CLT [phone]

## 2019-04-05 NOTE — Telephone Encounter (Signed)
Pt left a voicemail stating that she has had pain and nausea, to the point of almost vomiting, for 2 weeks.  She also stated that she has not been able to eat or drink hardly anything.  Dr. Raliegh Ip made aware and requested for the patient to be seen today.  Amy was notified and called the pt to schedule her to be seen today.

## 2019-04-05 NOTE — Telephone Encounter (Signed)
pt called to cancel says that she is sick from chemo

## 2019-04-05 NOTE — Patient Instructions (Addendum)
[place] at [facility] Discharge Instructions  You were seen today by Dr. [name]. He went over your recent lab results, and how you've been feeling since your last visit.  He will see you back in for labs and follow up. Received IV hydration and Antiemetic medication IV today  Thank you for choosing [place] at [facility] to provide your oncology and hematology care.  To afford each patient quality time with our provider, please arrive at least 15 minutes before your scheduled appointment time.   If you have a lab appointment with the [facility] please come in thru the  Main Entrance and check in at the main information desk  You need to re-schedule your appointment should you arrive 10 or more minutes late.  We strive to give you quality time with our providers, and arriving late affects you and other patients whose appointments are after yours.  Also, if you no show three or more times for appointments you may be dismissed from the clinic at the providers discretion.     Again, thank you for choosing [facility].  Our hope is that these requests will decrease the amount of time that you wait before being seen by our physicians.       _____________________________________________________________  Should you have questions after your visit to [facility], please contact our office at [phone] between the hours of 8:00 a.m. and 4:30 p.m.  Voicemails left after 4:00 p.m. will not be returned until the following business day.  For prescription refill requests, have your pharmacy contact our office and allow 72 hours.    Cancer Center Support Programs:   > Cancer Support Group  2nd Tuesday of the month 1pm-2pm, Journey Room

## 2019-04-05 NOTE — Progress Notes (Signed)
Pamela Long, Pamela Long   CLINIC:  Medical Oncology/Hematology  PCP:  Pamela Albee, Long [place] [address] [phone]   REASON FOR VISIT:  Follow-up for metastatic breast cancer.   BRIEF ONCOLOGIC HISTORY:  Oncology History  Metastatic breast cancer ([place])  07/02/2018 Initial Diagnosis   Metastatic breast cancer ([place])   09/18/2018 -  Chemotherapy   The patient had palonosetron (ALOXI) injection 0.25 mg, 0.25 mg, Intravenous,  Once, 5 of 5 cycles Administration: 0.25 mg (10/01/2018), 0.25 mg (10/09/2018), 0.25 mg (10/23/2018), 0.25 mg (10/30/2018), 0.25 mg (11/12/2018), 0.25 mg (12/03/2018), 0.25 mg (12/24/2018), 0.25 mg (01/22/2019), 0.25 mg (01/07/2019), 0.25 mg (02/04/2019), 0.25 mg (02/25/2019), 0.25 mg (03/25/2019) PACLitaxel (TAXOL) 120 mg in sodium chloride 0.9 % 250 mL chemo infusion (</= 17m/m2), 60 mg/m2 = 120 mg (100 % of original dose 60 mg/m2), Intravenous,  Once, 5 of 5 cycles Dose modification: 60 mg/m2 (original dose 60 mg/m2, Cycle 1, Reason: Change in LFTs), 80 mg/m2 (original dose 60 mg/m2, Cycle Long, Reason: Provider Judgment), 54 mg/m2 (90 % of original dose 60 mg/m2, Cycle 3, Reason: Other (see comments), Comment: hepatic dysfunction), 40 mg/m2 (66.7 % of original dose 60 mg/m2, Cycle 4, Reason: Other (see comments), Comment: neutropenia), 40 mg/m2 (original dose 60 mg/m2, Cycle 5, Reason: Other (see comments), Comment: liver dysfunction) Administration: 120 mg (09/18/2018), 120 mg (10/01/2018), 120 mg (10/23/2018), 120 mg (10/09/2018), 120 mg (10/30/2018), 162 mg (11/12/2018), 162 mg (12/03/2018), 108 mg (12/24/2018), 108 mg (01/22/2019), 108 mg (01/07/2019), 78 mg (02/04/2019), 78 mg (02/25/2019), 78 mg (03/25/2019)  for chemotherapy treatment.       CANCER STAGING: Cancer Staging No matching staging information was found for the patient.   INTERVAL HISTORY:  [name].[id number]. female seen for follow-up of metastatic  breast cancer to the liver.  Her last treatment was on 03/25/2019.  She reported having nausea which lasted about 4 days.  She also vomited twice last week.  Zofran twice daily did not help much.  She also used the patch which did not help.  She started taking Phenergan this morning.  She reports decrease in appetite.  She also had headaches in the frontal region from her migraines.  She also reported epigastric pain which is new.  She is taking oxycodone 1 to Long tablets/day.  REVIEW OF SYSTEMS:  Review of Systems  Gastrointestinal: Positive for abdominal pain and constipation.  Neurological: Positive for numbness.  Psychiatric/Behavioral: Positive for depression.  All other systems reviewed and are negative.    PAST MEDICAL/SURGICAL HISTORY:  Past Medical History:  Diagnosis Date  . Anemia   . Anxiety   . Asthma   . Breast cancer ([place]    Left, 2017  . Complication of anesthesia   . Depression   . GERD (gastroesophageal reflux disease)   . Migraine   . OAB (overactive bladder)   . PONV (postoperative nausea and vomiting)   . Seasonal allergies    Past Surgical History:  Procedure Laterality Date  . ABDOMINAL HYSTERECTOMY     breast cancer  . ANKLE RECONSTRUCTION Right 1989  . BILIARY STENT PLACEMENT N/A 09/22/2018   Procedure: BILIARY STENT PLACEMENT;  Surgeon: RRogene Houston Long;  [facility];  Service: Endoscopy;  Laterality: N/A;  . BILIARY STENT PLACEMENT N/A 03/16/2019   Procedure: BILIARY STENT EXCHANGE;  Surgeon: RRogene Houston Long;  Location: [facility];  Service: Endoscopy;  Laterality:  N/A;  . BREAST SURGERY    . COLONOSCOPY WITH PROPOFOL N/A Long/27/2020   Dr. Oneida Alar: small internal hemorrhoids, moderate external hemorrhoids. rectal bleeding due to ?anal fissure vs hemorrhoids. nex tcs 10 years  . ERCP N/A 09/22/2018   Procedure: ENDOSCOPIC RETROGRADE CHOLANGIOPANCREATOGRAPHY (ERCP);  Surgeon: Rogene Houston, Long;  Location: [facility];  Service:  Endoscopy;  Laterality: N/A;  . ERCP N/A 03/16/2019   Procedure: ENDOSCOPIC RETROGRADE CHOLANGIOPANCREATOGRAPHY (ERCP);  Surgeon: Rogene Houston, Long;  Location: [facility];  Service: Endoscopy;  Laterality: N/A;  . FOREIGN BODY REMOVAL N/A 08/29/2017   from lip  . KNEE SURGERY Right 1989   bone spur  . MASTECTOMY  2017   bil mastectomies  . PORTACATH PLACEMENT Left 12/23/2018   Procedure: INSERTION PORT-A-CATH LEFT SUBCLAVIAN;  Surgeon: Aviva Signs, Long;  Location: [facility];  Service: General;  Laterality: Left;  . SINUSOTOMY       SOCIAL HISTORY:  Social History   Socioeconomic History  . Marital status: Single    Spouse name: Not on file  . Number of children: 1  . Years of education: 59  . Highest education level: Not on file  Occupational History  . Occupation: disabled  Social Needs  . Financial resource strain: Somewhat hard  . Food insecurity    Worry: Sometimes true    Inability: Sometimes true  . Transportation needs    Medical: No    Non-medical: No  Tobacco Use  . Smoking status: Never Smoker  . Smokeless tobacco: Never Used  Substance and Sexual Activity  . Alcohol use: No  . Drug use: No  . Sexual activity: Not Currently  Lifestyle  . Physical activity    Days per week: 0 days    Minutes per session: 0 min  . Stress: Rather much  Relationships  . Social Herbalist on phone: Once a week    Gets together: Once a week    Attends religious service: 1 to 4 times per year    Active member of club or organization: Yes    Attends meetings of clubs or organizations: Never    Relationship status: Never married  . Intimate partner violence    Fear of current or ex partner: No    Emotionally abused: No    Physically abused: No    Forced sexual activity: No  Other Topics Concern  . Not on file  Social History Narrative   Bachelors degree   Accounting   Lives with daughter Pamela Long who has autism and diabetes   Likes to sew, quilt, crafts,  crochet    FAMILY HISTORY:  Family History  Problem Relation Age of Onset  . Arthritis Mother   . COPD Mother   . Depression Mother   . Diabetes Mother   . Kidney disease Father   . Heart disease Father 77  . Drug abuse Father   . Hypertension Father   . Diabetes Daughter   . Hashimoto's thyroiditis Daughter   . Irritable bowel syndrome Daughter   . Autism spectrum disorder Daughter   . Early death Maternal Grandmother        drowned  . Early death Maternal Grandfather   . Heart disease Maternal Grandfather   . Heart disease Paternal Grandfather   . Breast cancer Maternal Aunt   . Breast cancer Cousin   . Lung cancer Maternal Aunt   . Lung cancer Maternal Uncle   . Colon cancer Neg Hx  CURRENT MEDICATIONS:  Outpatient Encounter Medications as of 04/05/2019  Medication Sig  . escitalopram (LEXAPRO) 20 MG tablet Take 1 tablet (20 mg total) by mouth daily.  . furosemide (LASIX) 20 MG tablet TAKE 1 TABLET BY MOUTH EVERY DAY  . gabapentin (NEURONTIN) 300 MG capsule Take 1 capsule (300 mg total) by mouth at bedtime.  Marland Kitchen lubiprostone (AMITIZA) 8 MCG capsule Take 1 capsule (8 mcg total) by mouth Long (two) times daily with a meal.  . magic mouthwash w/lidocaine SOLN Take 5 mLs by mouth 4 (four) times daily as needed for mouth pain.  . magnesium oxide (MAG-OX) 400 MG tablet TAKE 1 TABLET BY MOUTH EVERY DAY  . nystatin-triamcinolone (MYCOLOG II) cream Apply 1 application topically Long (two) times daily.   Marland Kitchen oxyCODONE-acetaminophen (PERCOCET/ROXICET) 5-325 MG tablet Take 1 tablet by mouth every 4 (four) hours as needed for severe pain.  Marland Kitchen PACLitaxel (TAXOL IV) Inject into the vein every 14 (fourteen) days.   . pantoprazole (PROTONIX) 40 MG tablet TAKE 1 TABLET BY MOUTH EVERY DAY BEFORE BREAKFAST  . Polyvinyl Alcohol-Povidone PF (REFRESH) 1.4-0.6 % SOLN Place 1 drop into both eyes as needed (for dry eyes).   . potassium chloride (KLOR-CON) 10 MEQ tablet Take 10 mEq by mouth Long (two) times  daily.  . rizatriptan (MAXALT-MLT) 10 MG disintegrating tablet Take 10 mg by mouth every Long (two) hours as needed for migraine.   Marland Kitchen scopolamine (TRANSDERM-SCOP) 1 MG/3DAYS Place 1 patch (1.5 mg total) onto the skin every 3 (three) days.  Marland Kitchen spironolactone (ALDACTONE) 25 MG tablet TAKE 1 TABLET BY MOUTH EVERY DAY  . temazepam (RESTORIL) 15 MG capsule TAKE 1 CAPSULE (15 MG TOTAL) BY MOUTH AT BEDTIME AS NEEDED FOR SLEEP. (Patient taking differently: Take 15 mg by mouth at bedtime. )  . traMADol (ULTRAM) 50 MG tablet Take 1 tablet (50 mg total) by mouth Long (two) times daily as needed.  . [DISCONTINUED] potassium chloride (KLOR-CON) 10 MEQ tablet Take 20 mEq by mouth daily.   Facility-Administered Encounter Medications as of 04/05/2019  Medication  . [COMPLETED] heparin lock flush 100 unit/mL  . [COMPLETED] heparin lock flush 100 unit/mL  . [COMPLETED] ondansetron (ZOFRAN) 8 mg in sodium chloride 0.9 % 50 mL IVPB  . [COMPLETED] sodium chloride 0.9 % 1,000 mL with potassium chloride 20 mEq, magnesium sulfate Long g infusion  . sodium chloride flush (NS) 0.9 % injection 10 mL  . [COMPLETED] sodium chloride flush (NS) 0.9 % injection 10 mL  . [DISCONTINUED] ondansetron (ZOFRAN) 8 mg in sodium chloride 0.9 % 50 mL IVPB    ALLERGIES:  Allergies  Allergen Reactions  . Salagen [Pilocarpine] Other (See Comments)    Can cause liver failure   . Tape Itching and Other (See Comments)    Depending on the adhesive-blistering occurs  . Amoxicillin Hives and Other (See Comments)    Rash only DID THE REACTION INVOLVE: Swelling of the face/tongue/throat, SOB, or low BP? Sudden or severe rash/hives, skin peeling, or the inside of the mouth or nose?  Did it require medical treatment?  When did it last happen? If all above answers are "NO", may proceed with cephalosporin use.   . Caffeine Diarrhea, Nausea Only, Palpitations and Other (See Comments)    Headache  . Tetanus Toxoids Swelling and Other (See  Comments)    Local reaction     PHYSICAL EXAM:  ECOG Performance status: 1 Blood pressure is 121/86.  Pulse rate is 106.  Respiratory rate 16.  Temperature 98.  Saturations 97%.  Physical Exam Vitals signs reviewed.  Constitutional:      Appearance: Normal appearance.  Cardiovascular:     Rate and Rhythm: Normal rate and regular rhythm.     Heart sounds: Normal heart sounds.  Pulmonary:     Effort: Pulmonary effort is normal.     Breath sounds: Normal breath sounds.  Abdominal:     General: There is no distension.     Palpations: Abdomen is soft. There is no mass.  Musculoskeletal:        General: No swelling.  Skin:    General: Skin is warm.  Neurological:     General: No focal deficit present.     Mental Status: She is alert and oriented to person, place, and time.  Psychiatric:        Mood and Affect: Mood normal.        Behavior: Behavior normal.    Epigastric tenderness present without mass.  LABORATORY DATA:  I have reviewed the labs as listed.  CBC    Component Value Date/Time   WBC Long.9 (L) 04/05/2019 1031   RBC 4.31 04/05/2019 1031   HGB 11.Long (L) 04/05/2019 1031   HCT 35.5 (L) 04/05/2019 1031   PLT 220 04/05/2019 1031   MCV 82.4 04/05/2019 1031   MCH 26.0 04/05/2019 1031   MCHC 31.5 04/05/2019 1031   RDW 18.6 (H) 04/05/2019 1031   LYMPHSABS 0.5 (L) 04/05/2019 1031   MONOABS 0.4 04/05/2019 1031   EOSABS 0.1 04/05/2019 1031   BASOSABS 0.0 04/05/2019 1031   CMP Latest Ref Rng & Units 04/05/2019 03/25/2019 03/11/2019  Glucose 70 - 99 mg/dL 99 175(H) 114(H)  BUN 6 - 20 mg/dL '8 7 11  ' Creatinine 0.44 - 1.00 mg/dL 0.56 0.66 0.46  Sodium 135 - 145 mmol/L 137 137 139  Potassium 3.5 - 5.1 mmol/L 3.1(L) Long.9(L) 3.Long(L)  Chloride 98 - 111 mmol/L 101 101 105  CO2 22 - 32 mmol/L '25 26 26  ' Calcium 8.9 - 10.3 mg/dL 9.0 8.6(L) 8.7(L)  Total Protein 6.5 - 8.1 g/dL 6.6 6.0(L) 6.0(L)  Total Bilirubin 0.3 - 1.Long mg/dL 1.6(H) 1.1 1.0  Alkaline Phos 38 - 126 U/L 270(H)  238(H) 185(H)  AST 15 - 41 U/L 100(H) 100(H) 56(H)  ALT 0 - 44 U/L 40 39 27       DIAGNOSTIC IMAGING:  I have independently reviewed the scans and discussed with the patient.   I have reviewed Venita Lick LPN's note and agree with the documentation.  I personally performed a face-to-face visit, made revisions and my assessment and plan is as follows.    ASSESSMENT & PLAN:   Malignant neoplasm of central portion of left breast in female, estrogen receptor positive ([place]) 1.  Metastatic breast cancer to the liver, ER/PR positive and HER-Long negative: -Venous equipment Faslodex from 07/02/2018 through 09/07/2018 with progression. -ERCP and plastic stent placement in the hepatic duct on 09/22/2018. -Paclitaxel 60 mg per metered square started on 09/18/2018.  -She underwent metal stent placement on 03/18/2019. -Last CT of the abdomen and pelvis on 01/28/2019 showed stable hepatic metastatic disease 12/29/2018. -Last chemotherapy was on 03/25/2019 at 40 mg per metered square. -She had nausea which lasted for days after chemotherapy.  She vomited twice last week.  She did try the scopolamine patch which did not help.  She was taking Zofran 8 mg twice daily which also stopped working.  She started taking Phenergan 25 mg this morning. -We have  reviewed her labs.  She will be given IV hydration.  We will also give IV Zofran. She will be reevaluated in 1 week.  We plan to repeat her treatment in Long weeks.  Long.  Hypokalemia/hypomagnesemia: -She has not taken any pills since last 1 week.  Hence potassium and magnesium is low today.  3.  Ascites: -This is from hepatic metastatic disease. -She stopped taking Lasix and spironolactone last week. -Last paracentesis on 02/12/2019 with Long.3 L removed.    4.  Neuropathy: -We will continue gabapentin 300 mg at bedtime.  Burning sensation is well controlled.  5.  Abdominal pain: -Her usual pain is in the right upper quadrant area.  However she got some pain in  the epigastric region since last week.  She has been taking oxycodone 1-Long times per day. -If the pain does not subside at next visit, will consider imaging.   Total time spent is 25 minutes with more than 50% of the time spent face-to-face discussing labs, counseling and coordination of care.  Orders placed this encounter:  Orders Placed This Encounter  Procedures  . CBC with Differential/Platelet  . Comprehensive metabolic panel  . Magnesium  . Cancer antigen 27.29  . Cancer antigen 15-3      Derek Jack, [place] [phone]

## 2019-04-06 LAB — CANCER ANTIGEN 27.29: CA 27.29: 119.4 U/mL — ABNORMAL HIGH (ref 0.0–38.6)

## 2019-04-06 LAB — CANCER ANTIGEN 15-3: CA 15-3: 103 U/mL — ABNORMAL HIGH (ref 0.0–25.0)

## 2019-04-07 ENCOUNTER — Telehealth (HOSPITAL_COMMUNITY): Payer: Self-pay

## 2019-04-07 ENCOUNTER — Ambulatory Visit (HOSPITAL_COMMUNITY): Payer: 59

## 2019-04-07 ENCOUNTER — Other Ambulatory Visit: Payer: Self-pay

## 2019-04-07 NOTE — Telephone Encounter (Signed)
pt called to cancel both appts on wed and friday due to she is still not feeling up to par.

## 2019-04-08 ENCOUNTER — Encounter (HOSPITAL_COMMUNITY): Payer: Self-pay | Admitting: Hematology

## 2019-04-08 ENCOUNTER — Inpatient Hospital Stay (HOSPITAL_BASED_OUTPATIENT_CLINIC_OR_DEPARTMENT_OTHER): Payer: 59 | Admitting: Hematology

## 2019-04-08 ENCOUNTER — Inpatient Hospital Stay (HOSPITAL_COMMUNITY): Payer: 59

## 2019-04-08 VITALS — BP 122/78 | HR 136 | Temp 97.1°F | Resp 18 | Wt 167.0 lb

## 2019-04-08 VITALS — BP 130/76 | HR 115 | Temp 96.6°F | Resp 18

## 2019-04-08 DIAGNOSIS — C50112 Malignant neoplasm of central portion of left female breast: Secondary | ICD-10-CM

## 2019-04-08 DIAGNOSIS — C50919 Malignant neoplasm of unspecified site of unspecified female breast: Secondary | ICD-10-CM

## 2019-04-08 DIAGNOSIS — Z5111 Encounter for antineoplastic chemotherapy: Secondary | ICD-10-CM | POA: Diagnosis not present

## 2019-04-08 LAB — CBC WITH DIFFERENTIAL/PLATELET
Abs Immature Granulocytes: 0 10*3/uL (ref 0.00–0.07)
Basophils Absolute: 0 10*3/uL (ref 0.0–0.1)
Basophils Relative: 1 %
Eosinophils Absolute: 0 10*3/uL (ref 0.0–0.5)
Eosinophils Relative: 1 %
HCT: 36.7 % (ref 36.0–46.0)
Hemoglobin: 11.4 g/dL — ABNORMAL LOW (ref 12.0–15.0)
Immature Granulocytes: 0 %
Lymphocytes Relative: 19 %
Lymphs Abs: 0.5 10*3/uL — ABNORMAL LOW (ref 0.7–4.0)
MCH: 25.6 pg — ABNORMAL LOW (ref 26.0–34.0)
MCHC: 31.1 g/dL (ref 30.0–36.0)
MCV: 82.5 fL (ref 80.0–100.0)
Monocytes Absolute: 0.7 10*3/uL (ref 0.1–1.0)
Monocytes Relative: 32 %
Neutro Abs: 1.1 10*3/uL — ABNORMAL LOW (ref 1.7–7.7)
Neutrophils Relative %: 47 %
Platelets: 212 10*3/uL (ref 150–400)
RBC: 4.45 MIL/uL (ref 3.87–5.11)
RDW: 18.6 % — ABNORMAL HIGH (ref 11.5–15.5)
WBC: 2.3 10*3/uL — ABNORMAL LOW (ref 4.0–10.5)
nRBC: 0 % (ref 0.0–0.2)

## 2019-04-08 LAB — COMPREHENSIVE METABOLIC PANEL
ALT: 41 U/L (ref 0–44)
AST: 109 U/L — ABNORMAL HIGH (ref 15–41)
Albumin: 2.7 g/dL — ABNORMAL LOW (ref 3.5–5.0)
Alkaline Phosphatase: 305 U/L — ABNORMAL HIGH (ref 38–126)
Anion gap: 12 (ref 5–15)
BUN: 8 mg/dL (ref 6–20)
CO2: 24 mmol/L (ref 22–32)
Calcium: 8.7 mg/dL — ABNORMAL LOW (ref 8.9–10.3)
Chloride: 101 mmol/L (ref 98–111)
Creatinine, Ser: 0.58 mg/dL (ref 0.44–1.00)
GFR calc Af Amer: >60 mL/min (ref >60)
GFR calc non Af Amer: >60 mL/min (ref >60)
Glucose, Bld: 102 mg/dL — ABNORMAL HIGH (ref 70–99)
Potassium: 3 mmol/L — ABNORMAL LOW (ref 3.5–5.1)
Sodium: 137 mmol/L (ref 135–145)
Total Bilirubin: 1.9 mg/dL — ABNORMAL HIGH (ref 0.3–1.2)
Total Protein: 6.4 g/dL — ABNORMAL LOW (ref 6.5–8.1)

## 2019-04-08 MED ORDER — SODIUM CHLORIDE 0.9 % IV SOLN
8.0000 mg | Freq: Once | INTRAVENOUS | Status: AC
  Administered 2019-04-08: 8 mg via INTRAVENOUS
  Filled 2019-04-08: qty 4

## 2019-04-08 MED ORDER — HEPARIN SOD (PORK) LOCK FLUSH 100 UNIT/ML IV SOLN
500.0000 [IU] | Freq: Once | INTRAVENOUS | Status: AC | PRN
  Administered 2019-04-08: 500 [IU]

## 2019-04-08 MED ORDER — LORAZEPAM 1 MG PO TABS
0.5000 mg | ORAL_TABLET | Freq: Once | ORAL | Status: AC
  Administered 2019-04-08: 0.5 mg via ORAL
  Filled 2019-04-08: qty 1

## 2019-04-08 MED ORDER — ALUM & MAG HYDROXIDE-SIMETH 200-200-20 MG/5ML PO SUSP
30.0000 mL | Freq: Once | ORAL | Status: AC
  Administered 2019-04-08: 11:00:00 30 mL via ORAL
  Filled 2019-04-08: qty 30

## 2019-04-08 MED ORDER — PROCHLORPERAZINE MALEATE 10 MG PO TABS: 10.0000 mg | ORAL_TABLET | Freq: Four times a day (QID) | ORAL | 3 refills | Status: DC | PRN

## 2019-04-08 MED ORDER — SODIUM CHLORIDE 0.9 % IV SOLN
Freq: Once | INTRAVENOUS | Status: AC
  Administered 2019-04-08: 11:00:00 via INTRAVENOUS
  Filled 2019-04-08: qty 1000

## 2019-04-08 MED ORDER — SODIUM CHLORIDE 0.9% FLUSH
10.0000 mL | Freq: Once | INTRAVENOUS | Status: AC | PRN
  Administered 2019-04-08: 13:00:00 10 mL

## 2019-04-08 NOTE — Progress Notes (Signed)
[place] [place], [place] [address]   CLINIC:  Medical Oncology/Hematology  PCP:  Doree Albee, MD [place] [address] [phone]   REASON FOR VISIT:  Follow-up for metastatic breast cancer.   BRIEF ONCOLOGIC HISTORY:  Oncology History  Metastatic breast cancer ([place])  07/02/2018 Initial Diagnosis   Metastatic breast cancer ([place])   09/18/2018 -  Chemotherapy   The patient had palonosetron (ALOXI) injection 0.25 mg, 0.25 mg, Intravenous,  Once, 5 of 5 cycles Administration: 0.25 mg (10/01/2018), 0.25 mg (10/09/2018), 0.25 mg (10/23/2018), 0.25 mg (10/30/2018), 0.25 mg (11/12/2018), 0.25 mg (12/03/2018), 0.25 mg (12/24/2018), 0.25 mg (01/22/2019), 0.25 mg (01/07/2019), 0.25 mg (02/04/2019), 0.25 mg (02/25/2019), 0.25 mg (03/25/2019) PACLitaxel (TAXOL) 120 mg in sodium chloride 0.9 % 250 mL chemo infusion (</= 45m/m2), 60 mg/m2 = 120 mg (100 % of original dose 60 mg/m2), Intravenous,  Once, 5 of 5 cycles Dose modification: 60 mg/m2 (original dose 60 mg/m2, Cycle 1, Reason: Change in LFTs), 80 mg/m2 (original dose 60 mg/m2, Cycle 2, Reason: Provider Judgment), 54 mg/m2 (90 % of original dose 60 mg/m2, Cycle 3, Reason: Other (see comments), Comment: hepatic dysfunction), 40 mg/m2 (66.7 % of original dose 60 mg/m2, Cycle 4, Reason: Other (see comments), Comment: neutropenia), 40 mg/m2 (original dose 60 mg/m2, Cycle 5, Reason: Other (see comments), Comment: liver dysfunction) Administration: 120 mg (09/18/2018), 120 mg (10/01/2018), 120 mg (10/23/2018), 120 mg (10/09/2018), 120 mg (10/30/2018), 162 mg (11/12/2018), 162 mg (12/03/2018), 108 mg (12/24/2018), 108 mg (01/22/2019), 108 mg (01/07/2019), 78 mg (02/04/2019), 78 mg (02/25/2019), 78 mg (03/25/2019) fosaprepitant (EMEND) 150 mg in sodium chloride 0.9 % 145 mL IVPB, , Intravenous,  Once, 1 of 1 cycle  for chemotherapy treatment.       CANCER STAGING: Cancer Staging No matching staging information was found  for the patient.   INTERVAL HISTORY:  Ms. [age]shmore89y.o. female seen for follow-up of metastatic breast cancer to the liver.  She is here for next treatment and toxicity assessment.  Last treatment was on 03/25/2019.  She complained of epigastric pain which is new.  Her usual pain is right upper quadrant.  Pain is also associated with nausea and vomiting.  Last vomiting was last week.  She is taking Zofran which did not help with the nausea.  She is taking Phenergan at this time.  She is also taking oxycodone 1 to 2 tablets/day for the abdominal pain.  No fevers or chills reported.  REVIEW OF SYSTEMS:  Review of Systems  Gastrointestinal: Positive for abdominal pain, constipation, nausea and vomiting.  Neurological: Positive for numbness.  All other systems reviewed and are negative.    PAST MEDICAL/SURGICAL HISTORY:  Past Medical History:  Diagnosis Date  . Anemia   . Anxiety   . Asthma   . Breast cancer ([place]    Left, 2017  . Complication of anesthesia   . Depression   . GERD (gastroesophageal reflux disease)   . Migraine   . OAB (overactive bladder)   . PONV (postoperative nausea and vomiting)   . Seasonal allergies    Past Surgical History:  Procedure Laterality Date  . ABDOMINAL HYSTERECTOMY     breast cancer  . ANKLE RECONSTRUCTION Right 1989  . BILIARY STENT PLACEMENT N/A 09/22/2018   Procedure: BILIARY STENT PLACEMENT;  Surgeon: RRogene Houston MD;  [facility];  Service: Endoscopy;  Laterality: N/A;  . BILIARY STENT PLACEMENT N/A 03/16/2019  Procedure: BILIARY STENT EXCHANGE;  Surgeon: Rogene Houston, MD;  Location: [facility];  Service: Endoscopy;  Laterality: N/A;  . BREAST SURGERY    . COLONOSCOPY WITH PROPOFOL N/A 08/06/2018   Dr. Oneida Alar: small internal hemorrhoids, moderate external hemorrhoids. rectal bleeding due to ?anal fissure vs hemorrhoids. nex tcs 10 years  . ERCP N/A 09/22/2018   Procedure: ENDOSCOPIC RETROGRADE  CHOLANGIOPANCREATOGRAPHY (ERCP);  Surgeon: Rogene Houston, MD;  Location: [facility];  Service: Endoscopy;  Laterality: N/A;  . ERCP N/A 03/16/2019   Procedure: ENDOSCOPIC RETROGRADE CHOLANGIOPANCREATOGRAPHY (ERCP);  Surgeon: Rogene Houston, MD;  Location: [facility];  Service: Endoscopy;  Laterality: N/A;  . FOREIGN BODY REMOVAL N/A 08/29/2017   from lip  . KNEE SURGERY Right 1989   bone spur  . MASTECTOMY  2017   bil mastectomies  . PORTACATH PLACEMENT Left 12/23/2018   Procedure: INSERTION PORT-A-CATH LEFT SUBCLAVIAN;  Surgeon: Aviva Signs, MD;  Location: [facility] ORS;  Service: General;  Laterality: Left;  . SINUSOTOMY       SOCIAL HISTORY:  Social History   Socioeconomic History  . Marital status: Single    Spouse name: Not on file  . Number of children: 1  . Years of education: 77  . Highest education level: Not on file  Occupational History  . Occupation: disabled  Social Needs  . Financial resource strain: Somewhat hard  . Food insecurity    Worry: Sometimes true    Inability: Sometimes true  . Transportation needs    Medical: No    Non-medical: No  Tobacco Use  . Smoking status: Never Smoker  . Smokeless tobacco: Never Used  Substance and Sexual Activity  . Alcohol use: No  . Drug use: No  . Sexual activity: Not Currently  Lifestyle  . Physical activity    Days per week: 0 days    Minutes per session: 0 min  . Stress: Rather much  Relationships  . Social Herbalist on phone: Once a week    Gets together: Once a week    Attends religious service: 1 to 4 times per year    Active member of club or organization: Yes    Attends meetings of clubs or organizations: Never    Relationship status: Never married  . Intimate partner violence    Fear of current or ex partner: No    Emotionally abused: No    Physically abused: No    Forced sexual activity: No  Other Topics Concern  . Not on file  Social History Narrative   Bachelors degree    Accounting   Lives with daughter [name] who has autism and diabetes   Likes to sew, quilt, crafts, crochet    FAMILY HISTORY:  Family History  Problem Relation Age of Onset  . Arthritis Mother   . COPD Mother   . Depression Mother   . Diabetes Mother   . Kidney disease Father   . Heart disease Father 7  . Drug abuse Father   . Hypertension Father   . Diabetes Daughter   . Hashimoto's thyroiditis Daughter   . Irritable bowel syndrome Daughter   . Autism spectrum disorder Daughter   . Early death Maternal Grandmother        drowned  . Early death Maternal Grandfather   . Heart disease Maternal Grandfather   . Heart disease Paternal Grandfather   . Breast cancer Maternal Aunt   . Breast cancer Cousin   .  Lung cancer Maternal Aunt   . Lung cancer Maternal Uncle   . Colon cancer Neg Hx     CURRENT MEDICATIONS:  Outpatient Encounter Medications as of 04/08/2019  Medication Sig  . escitalopram (LEXAPRO) 20 MG tablet Take 1 tablet (20 mg total) by mouth daily.  . furosemide (LASIX) 20 MG tablet TAKE 1 TABLET BY MOUTH EVERY DAY  . gabapentin (NEURONTIN) 300 MG capsule Take 1 capsule (300 mg total) by mouth at bedtime.  Marland Kitchen lubiprostone (AMITIZA) 8 MCG capsule Take 1 capsule (8 mcg total) by mouth 2 (two) times daily with a meal.  . magic mouthwash w/lidocaine SOLN Take 5 mLs by mouth 4 (four) times daily as needed for mouth pain.  . magnesium oxide (MAG-OX) 400 MG tablet TAKE 1 TABLET BY MOUTH EVERY DAY  . nystatin-triamcinolone (MYCOLOG II) cream Apply 1 application topically 2 (two) times daily.   Marland Kitchen oxyCODONE-acetaminophen (PERCOCET/ROXICET) 5-325 MG tablet Take 1 tablet by mouth every 4 (four) hours as needed for severe pain.  Marland Kitchen PACLitaxel (TAXOL IV) Inject into the vein every 14 (fourteen) days.   . pantoprazole (PROTONIX) 40 MG tablet TAKE 1 TABLET BY MOUTH EVERY DAY BEFORE BREAKFAST  . Polyvinyl Alcohol-Povidone PF (REFRESH) 1.4-0.6 % SOLN Place 1 drop into both eyes as  needed (for dry eyes).   . potassium chloride (KLOR-CON) 10 MEQ tablet Take 10 mEq by mouth 2 (two) times daily.  . prochlorperazine (COMPAZINE) 10 MG tablet Take 1 tablet (10 mg total) by mouth every 6 (six) hours as needed for nausea or vomiting.  . rizatriptan (MAXALT-MLT) 10 MG disintegrating tablet Take 10 mg by mouth every 2 (two) hours as needed for migraine.   Marland Kitchen scopolamine (TRANSDERM-SCOP) 1 MG/3DAYS Place 1 patch (1.5 mg total) onto the skin every 3 (three) days.  Marland Kitchen spironolactone (ALDACTONE) 25 MG tablet TAKE 1 TABLET BY MOUTH EVERY DAY  . temazepam (RESTORIL) 15 MG capsule TAKE 1 CAPSULE (15 MG TOTAL) BY MOUTH AT BEDTIME AS NEEDED FOR SLEEP. (Patient taking differently: Take 15 mg by mouth at bedtime. )  . traMADol (ULTRAM) 50 MG tablet Take 1 tablet (50 mg total) by mouth 2 (two) times daily as needed.   Facility-Administered Encounter Medications as of 04/08/2019  Medication  . sodium chloride flush (NS) 0.9 % injection 10 mL    ALLERGIES:  Allergies  Allergen Reactions  . Salagen [Pilocarpine] Other (See Comments)    Can cause liver failure   . Tape Itching and Other (See Comments)    Depending on the adhesive-blistering occurs  . Amoxicillin Hives and Other (See Comments)    Rash only DID THE REACTION INVOLVE: Swelling of the face/tongue/throat, SOB, or low BP? Sudden or severe rash/hives, skin peeling, or the inside of the mouth or nose?  Did it require medical treatment?  When did it last happen? If all above answers are "NO", may proceed with cephalosporin use.   . Caffeine Diarrhea, Nausea Only, Palpitations and Other (See Comments)    Headache  . Tetanus Toxoids Swelling and Other (See Comments)    Local reaction     PHYSICAL EXAM:  ECOG Performance status: 1 Blood pressure is 122/78.  Pulse rate is 120.  Respirate is 18.  Temperature 97.  Saturations are 96%.  Physical Exam Vitals signs reviewed.  Constitutional:      Appearance: Normal  appearance.  Cardiovascular:     Rate and Rhythm: Normal rate and regular rhythm.     Heart sounds: Normal heart sounds.  Pulmonary:     Effort: Pulmonary effort is normal.     Breath sounds: Normal breath sounds.  Abdominal:     General: There is no distension.     Palpations: Abdomen is soft. There is no mass.  Musculoskeletal:        General: No swelling.  Skin:    General: Skin is warm.  Neurological:     General: No focal deficit present.     Mental Status: She is alert and oriented to person, place, and time.  Psychiatric:        Mood and Affect: Mood normal.        Behavior: Behavior normal.    Epigastric tenderness present without mass.  LABORATORY DATA:  I have reviewed the labs as listed.  CBC    Component Value Date/Time   WBC 2.3 (L) 04/08/2019 0818   RBC 4.45 04/08/2019 0818   HGB 11.4 (L) 04/08/2019 0818   HCT 36.7 04/08/2019 0818   PLT 212 04/08/2019 0818   MCV 82.5 04/08/2019 0818   MCH 25.6 (L) 04/08/2019 0818   MCHC 31.1 04/08/2019 0818   RDW 18.6 (H) 04/08/2019 0818   LYMPHSABS 0.5 (L) 04/08/2019 0818   MONOABS 0.7 04/08/2019 0818   EOSABS 0.0 04/08/2019 0818   BASOSABS 0.0 04/08/2019 0818   CMP Latest Ref Rng & Units 04/08/2019 04/05/2019 03/25/2019  Glucose 70 - 99 mg/dL 102(H) 99 175(H)  BUN 6 - 20 mg/dL '8 8 7  ' Creatinine 0.44 - 1.00 mg/dL 0.58 0.56 0.66  Sodium 135 - 145 mmol/L 137 137 137  Potassium 3.5 - 5.1 mmol/L 3.0(L) 3.1(L) 2.9(L)  Chloride 98 - 111 mmol/L 101 101 101  CO2 22 - 32 mmol/L '24 25 26  ' Calcium 8.9 - 10.3 mg/dL 8.7(L) 9.0 8.6(L)  Total Protein 6.5 - 8.1 g/dL 6.4(L) 6.6 6.0(L)  Total Bilirubin 0.3 - 1.2 mg/dL 1.9(H) 1.6(H) 1.1  Alkaline Phos 38 - 126 U/L 305(H) 270(H) 238(H)  AST 15 - 41 U/L 109(H) 100(H) 100(H)  ALT 0 - 44 U/L 41 40 39       DIAGNOSTIC IMAGING:  I have independently reviewed the scans and discussed with the patient.   I have reviewed Venita Lick LPN's note and agree with the documentation.  I  personally performed a face-to-face visit, made revisions and my assessment and plan is as follows.    ASSESSMENT & PLAN:   Metastatic breast cancer ([place]) 1.  Metastatic breast cancer to the liver, ER/PR positive and HER-2 negative: -Abemaciclib and Faslodex from 07/02/2018 through 09/07/2018 with progression. -ERCP and plastic stent placement in the hepatic duct on 09/22/2018, changed to metal stent on 03/18/2019. -Paclitaxel started on 09/18/2018, last dose on 03/25/2019. -CT AP on 01/28/2019 showed stable hepatic metastatic disease. -She complains of new epigastric pain for the last 1 week.  She is also having nausea.  She vomited 1 time last week. -She is taking oxycodone 1 to 2 tablets/day for the abdominal pain. -Today her ANC is 1100.  Bilirubin is also elevated.  We will hold off on treatment.  She will be given IV Zofran, oral Ativan and IV fluids. -We will obtain a CT scan of the CAP to rule out any progression.  I will see her back after CT scans.  We might have to change therapy at this point.  She could not tolerate Xarelto in the past.  2.  Malignant ascites: -This is from hepatic metastatic disease.  She will continue spironolactone.  She  has not been taking Lasix. -Last paracentesis on 02/12/2019 with 2.3 L removed.  3.  Neuropathy: -She will continue gabapentin 300 mg at bedtime.  4.  Nausea/vomiting: -She has intermittent nausea associated with new onset epigastric pain.  She also has vomited last week. You should has been taking Zofran twice daily which is not helping.  She started taking Phenergan which has helped.  She is also wearing a scopolamine patch.  Total time spent is 25 minutes with more than 50% of the time spent face-to-face discussing labs, counseling and coordination of care.  Orders placed this encounter:  Orders Placed This Encounter  Procedures  . CT Abdomen Pelvis W Contrast  . CT Chest W Contrast  . CBC with Differential/Platelet  . Comprehensive  metabolic panel  . Bilirubin, direct      Derek Jack, MD [place] [phone]

## 2019-04-08 NOTE — Progress Notes (Signed)
Pt presents today for tx and f/u with Dr. [name]. MAR reviewed. Labs pending. Pt has complaints of pain today and rates her pain a 3 in the upper abdomen bilateral. Pt requesting Compazine script for nausea. Pt denies vomiting at this time.   HR elevated on arrival. 136. K+3.0/ TB 1.9/ ANC 1.1. Reported to [facility] LPN via message.   HR recheck 117  Treatment given today per MD orders. Tolerated infusion without adverse affects. Vital signs stable. No complaints at this time. Discharged from clinic via wheel chair.  F/U with [facility] as scheduled.

## 2019-04-08 NOTE — Progress Notes (Signed)
04/08/19  Holding Chemotherapy today.  Received order to give: house fluids, Lorazepam 0.5 mg oral x 1 dose, Maalox 30 ml orally x 1 dose, and ondansetron 8 mg IVPB x 1 dose.  Orders entered as stated above.  T.O. Dr Caroll Rancher RN/Grayling Schranz Ronnald Ramp, PharmD

## 2019-04-08 NOTE — Patient Instructions (Addendum)
[place] at [facility] Discharge Instructions  You were seen today by Dr. [name]. He went over your recent lab results. He will hold your treatment today. He will see you back in 2 weeks for labs and follow up.   Thank you for choosing [place] at [facility] to provide your oncology and hematology care.  To afford each patient quality time with our provider, please arrive at least 15 minutes before your scheduled appointment time.   If you have a lab appointment with the [facility] please come in thru the  Main Entrance and check in at the main information desk  You need to re-schedule your appointment should you arrive 10 or more minutes late.  We strive to give you quality time with our providers, and arriving late affects you and other patients whose appointments are after yours.  Also, if you no show three or more times for appointments you may be dismissed from the clinic at the providers discretion.     Again, thank you for choosing [facility].  Our hope is that these requests will decrease the amount of time that you wait before being seen by our physicians.       _____________________________________________________________  Should you have questions after your visit to [facility], please contact our office at [phone] between the hours of 8:00 a.m. and 4:30 p.m.  Voicemails left after 4:00 p.m. will not be returned until the following business day.  For prescription refill requests, have your pharmacy contact our office and allow 72 hours.    Cancer Center Support Programs:   > Cancer Support Group  2nd Tuesday of the month 1pm-2pm, Journey Room

## 2019-04-08 NOTE — Patient Instructions (Signed)
[facility] Discharge Instructions for Patients Receiving Chemotherapy  Today you received the following chemotherapy agents   To help prevent nausea and vomiting after your treatment, we encourage you to take your nausea medication   If you develop nausea and vomiting that is not controlled by your nausea medication, call the clinic.   BELOW ARE SYMPTOMS THAT SHOULD BE REPORTED IMMEDIATELY:  *FEVER GREATER THAN 100.5 F  *CHILLS WITH OR WITHOUT FEVER  NAUSEA AND VOMITING THAT IS NOT CONTROLLED WITH YOUR NAUSEA MEDICATION  *UNUSUAL SHORTNESS OF BREATH  *UNUSUAL BRUISING OR BLEEDING  TENDERNESS IN MOUTH AND THROAT WITH OR WITHOUT PRESENCE OF ULCERS  *URINARY PROBLEMS  *BOWEL PROBLEMS  UNUSUAL RASH Items with * indicate a potential emergency and should be followed up as soon as possible.  Feel free to call the clinic should you have any questions or concerns. The clinic phone number is [phone].  Please show the CHEMO ALERT CARD at check-in to the Emergency Department and triage nurse.   

## 2019-04-09 ENCOUNTER — Ambulatory Visit (HOSPITAL_COMMUNITY): Payer: 59

## 2019-04-09 ENCOUNTER — Encounter (HOSPITAL_COMMUNITY): Payer: Self-pay | Admitting: Hematology

## 2019-04-09 NOTE — Assessment & Plan Note (Signed)
1.  Metastatic breast cancer to the liver, ER/PR positive and HER-2 negative: -Abemaciclib and Faslodex from 07/02/2018 through 09/07/2018 with progression. -ERCP and plastic stent placement in the hepatic duct on 09/22/2018, changed to metal stent on 03/18/2019. -Paclitaxel started on 09/18/2018, last dose on 03/25/2019. -CT AP on 01/28/2019 showed stable hepatic metastatic disease. -She complains of new epigastric pain for the last 1 week.  She is also having nausea.  She vomited 1 time last week. -She is taking oxycodone 1 to 2 tablets/day for the abdominal pain. -Today her ANC is 1100.  Bilirubin is also elevated.  We will hold off on treatment.  She will be given IV Zofran, oral Ativan and IV fluids. -We will obtain a CT scan of the CAP to rule out any progression.  I will see her back after CT scans.  We might have to change therapy at this point.  She could not tolerate Xarelto in the past.  2.  Malignant ascites: -This is from hepatic metastatic disease.  She will continue spironolactone.  She has not been taking Lasix. -Last paracentesis on 02/12/2019 with 2.3 L removed.  3.  Neuropathy: -She will continue gabapentin 300 mg at bedtime.  4.  Nausea/vomiting: -She has intermittent nausea associated with new onset epigastric pain.  She also has vomited last week. You should has been taking Zofran twice daily which is not helping.  She started taking Phenergan which has helped.  She is also wearing a scopolamine patch. 

## 2019-04-10 ENCOUNTER — Encounter (HOSPITAL_COMMUNITY): Payer: Self-pay

## 2019-04-10 ENCOUNTER — Emergency Department (HOSPITAL_COMMUNITY): Payer: 59

## 2019-04-10 ENCOUNTER — Other Ambulatory Visit: Payer: Self-pay

## 2019-04-10 ENCOUNTER — Observation Stay (HOSPITAL_COMMUNITY)
Admission: EM | Admit: 2019-04-10 | Discharge: 2019-04-11 | Disposition: A | Discharge status: Home / Self Care | Payer: 59 | Attending: Internal Medicine | Admitting: Internal Medicine

## 2019-04-10 DIAGNOSIS — J9811 Atelectasis: Secondary | ICD-10-CM | POA: Diagnosis not present

## 2019-04-10 DIAGNOSIS — R1011 Right upper quadrant pain: Secondary | ICD-10-CM | POA: Diagnosis not present

## 2019-04-10 DIAGNOSIS — E43 Unspecified severe protein-calorie malnutrition: Secondary | ICD-10-CM | POA: Insufficient documentation

## 2019-04-10 DIAGNOSIS — I10 Essential (primary) hypertension: Secondary | ICD-10-CM | POA: Insufficient documentation

## 2019-04-10 DIAGNOSIS — R188 Other ascites: Secondary | ICD-10-CM | POA: Insufficient documentation

## 2019-04-10 DIAGNOSIS — J90 Pleural effusion, not elsewhere classified: Secondary | ICD-10-CM | POA: Insufficient documentation

## 2019-04-10 DIAGNOSIS — Z79899 Other long term (current) drug therapy: Secondary | ICD-10-CM | POA: Diagnosis not present

## 2019-04-10 DIAGNOSIS — F419 Anxiety disorder, unspecified: Secondary | ICD-10-CM | POA: Insufficient documentation

## 2019-04-10 DIAGNOSIS — E785 Hyperlipidemia, unspecified: Secondary | ICD-10-CM | POA: Insufficient documentation

## 2019-04-10 DIAGNOSIS — K219 Gastro-esophageal reflux disease without esophagitis: Secondary | ICD-10-CM | POA: Diagnosis not present

## 2019-04-10 DIAGNOSIS — R112 Nausea with vomiting, unspecified: Principal | ICD-10-CM | POA: Insufficient documentation

## 2019-04-10 DIAGNOSIS — Z20828 Contact with and (suspected) exposure to other viral communicable diseases: Secondary | ICD-10-CM | POA: Insufficient documentation

## 2019-04-10 DIAGNOSIS — E876 Hypokalemia: Secondary | ICD-10-CM | POA: Diagnosis not present

## 2019-04-10 DIAGNOSIS — F32A Depression, unspecified: Secondary | ICD-10-CM | POA: Diagnosis present

## 2019-04-10 DIAGNOSIS — F329 Major depressive disorder, single episode, unspecified: Secondary | ICD-10-CM | POA: Diagnosis not present

## 2019-04-10 DIAGNOSIS — Z5111 Encounter for antineoplastic chemotherapy: Secondary | ICD-10-CM | POA: Diagnosis not present

## 2019-04-10 DIAGNOSIS — N3281 Overactive bladder: Secondary | ICD-10-CM | POA: Diagnosis not present

## 2019-04-10 DIAGNOSIS — Z170 Estrogen receptor positive status [ER+]: Secondary | ICD-10-CM | POA: Diagnosis not present

## 2019-04-10 DIAGNOSIS — C50919 Malignant neoplasm of unspecified site of unspecified female breast: Secondary | ICD-10-CM | POA: Diagnosis present

## 2019-04-10 DIAGNOSIS — J45909 Unspecified asthma, uncomplicated: Secondary | ICD-10-CM | POA: Insufficient documentation

## 2019-04-10 DIAGNOSIS — E860 Dehydration: Secondary | ICD-10-CM | POA: Diagnosis present

## 2019-04-10 DIAGNOSIS — C50112 Malignant neoplasm of central portion of left female breast: Secondary | ICD-10-CM | POA: Diagnosis not present

## 2019-04-10 DIAGNOSIS — R079 Chest pain, unspecified: Secondary | ICD-10-CM | POA: Diagnosis not present

## 2019-04-10 LAB — CBC WITH DIFFERENTIAL/PLATELET
Abs Immature Granulocytes: 0.01 10*3/uL (ref 0.00–0.07)
Basophils Absolute: 0 10*3/uL (ref 0.0–0.1)
Basophils Relative: 1 %
Eosinophils Absolute: 0 10*3/uL (ref 0.0–0.5)
Eosinophils Relative: 1 %
HCT: 36.2 % (ref 36.0–46.0)
Hemoglobin: 11.3 g/dL — ABNORMAL LOW (ref 12.0–15.0)
Immature Granulocytes: 0 %
Lymphocytes Relative: 27 %
Lymphs Abs: 0.8 10*3/uL (ref 0.7–4.0)
MCH: 25.6 pg — ABNORMAL LOW (ref 26.0–34.0)
MCHC: 31.2 g/dL (ref 30.0–36.0)
MCV: 81.9 fL (ref 80.0–100.0)
Monocytes Absolute: 0.8 10*3/uL (ref 0.1–1.0)
Monocytes Relative: 28 %
Neutro Abs: 1.2 10*3/uL — ABNORMAL LOW (ref 1.7–7.7)
Neutrophils Relative %: 43 %
Platelets: 225 10*3/uL (ref 150–400)
RBC: 4.42 MIL/uL (ref 3.87–5.11)
RDW: 18.7 % — ABNORMAL HIGH (ref 11.5–15.5)
WBC: 2.8 10*3/uL — ABNORMAL LOW (ref 4.0–10.5)
nRBC: 0 % (ref 0.0–0.2)

## 2019-04-10 LAB — COMPREHENSIVE METABOLIC PANEL
ALT: 36 U/L (ref 0–44)
AST: 105 U/L — ABNORMAL HIGH (ref 15–41)
Albumin: 2.7 g/dL — ABNORMAL LOW (ref 3.5–5.0)
Alkaline Phosphatase: 293 U/L — ABNORMAL HIGH (ref 38–126)
Anion gap: 9 (ref 5–15)
BUN: 7 mg/dL (ref 6–20)
CO2: 24 mmol/L (ref 22–32)
Calcium: 8.4 mg/dL — ABNORMAL LOW (ref 8.9–10.3)
Chloride: 103 mmol/L (ref 98–111)
Creatinine, Ser: 0.52 mg/dL (ref 0.44–1.00)
GFR calc Af Amer: >60 mL/min (ref >60)
GFR calc non Af Amer: >60 mL/min (ref >60)
Glucose, Bld: 95 mg/dL (ref 70–99)
Potassium: 3 mmol/L — ABNORMAL LOW (ref 3.5–5.1)
Sodium: 136 mmol/L (ref 135–145)
Total Bilirubin: 1.6 mg/dL — ABNORMAL HIGH (ref 0.3–1.2)
Total Protein: 6.3 g/dL — ABNORMAL LOW (ref 6.5–8.1)

## 2019-04-10 LAB — URINALYSIS, ROUTINE W REFLEX MICROSCOPIC
Bilirubin Urine: NEGATIVE
Glucose, UA: NEGATIVE mg/dL
Hgb urine dipstick: NEGATIVE
Ketones, ur: 20 mg/dL — AB
Leukocytes,Ua: NEGATIVE
Nitrite: NEGATIVE
Protein, ur: NEGATIVE mg/dL
Specific Gravity, Urine: 1.025 (ref 1.005–1.030)
pH: 5 (ref 5.0–8.0)

## 2019-04-10 LAB — MAGNESIUM: Magnesium: 1.8 mg/dL (ref 1.7–2.4)

## 2019-04-10 LAB — SARS CORONAVIRUS 2 BY RT PCR (HOSPITAL ORDER, PERFORMED IN ~~LOC~~ HOSPITAL LAB): SARS Coronavirus 2: NEGATIVE

## 2019-04-10 LAB — LIPASE, BLOOD: Lipase: 14 U/L (ref 11–51)

## 2019-04-10 LAB — PHOSPHORUS: Phosphorus: 3.7 mg/dL (ref 2.5–4.6)

## 2019-04-10 LAB — TSH: TSH: 3.607 u[IU]/mL (ref 0.350–4.500)

## 2019-04-10 LAB — TROPONIN I (HIGH SENSITIVITY)
Troponin I (High Sensitivity): 5 ng/L (ref <18)
Troponin I (High Sensitivity): 5 ng/L (ref <18)

## 2019-04-10 IMAGING — CT CT ANGIO CHEST
2 of 6 series · 17 of 46 positions shown · IV contrast (omnipaque)
Comparison: Portable chest obtained earlier today.

CLINICAL DATA: Diffuse chest pain for the past 2 weeks, worse this
morning. Undergoing chemotherapy. Metastatic breast cancer.
Abdominal distension.

EXAM:
CT ANGIOGRAPHY CHEST
CT ABDOMEN AND PELVIS WITH CONTRAST
TECHNIQUE: Multidetector CT imaging of the chest was performed using the
standard protocol during bolus administration of intravenous
contrast. Multiplanar CT image reconstructions and MIPs were
obtained to evaluate the vascular anatomy. Multidetector CT imaging
of the abdomen and pelvis was performed using the standard protocol
during bolus administration of intravenous contrast.
CONTRAST:  100mL OMNIPAQUE IOHEXOL 350 MG/ML SOLN

[Series 3: pe axialthins · axial · 0.80mm/px · z∈[+978,+1298]mm · 14 of 352 slices shown]
[im 16/352  lung]
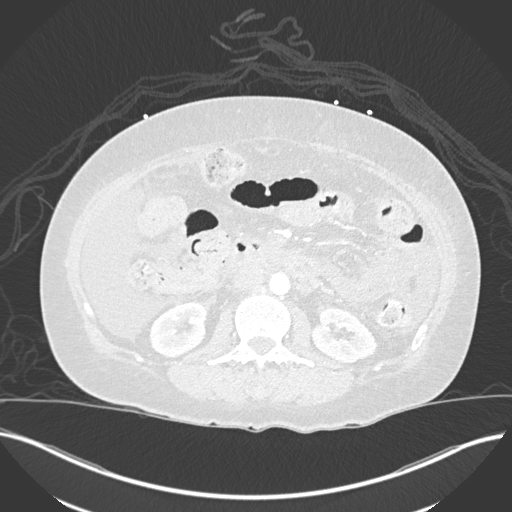
[im 46/352  soft-tissue]
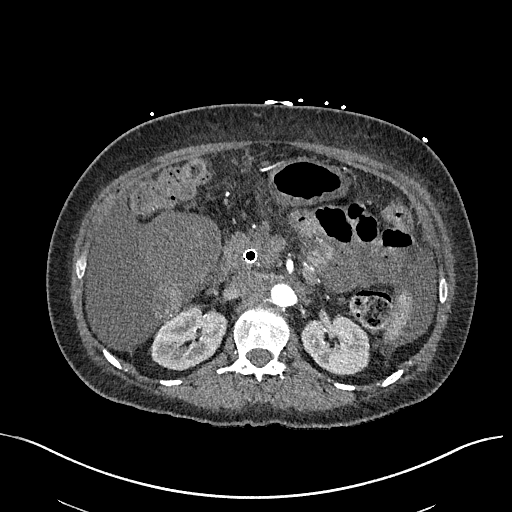
[im 62/352  lung]
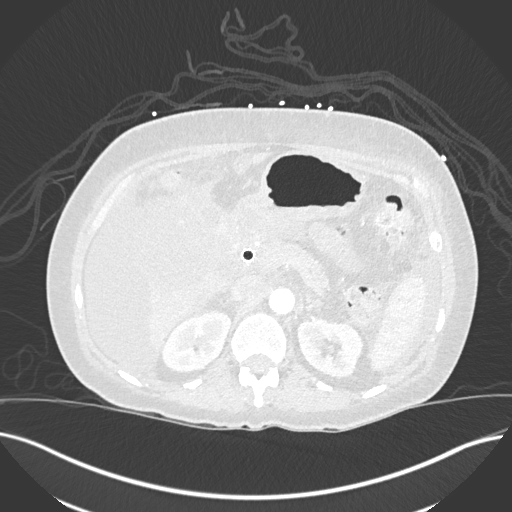
[im 92/352  soft-tissue]
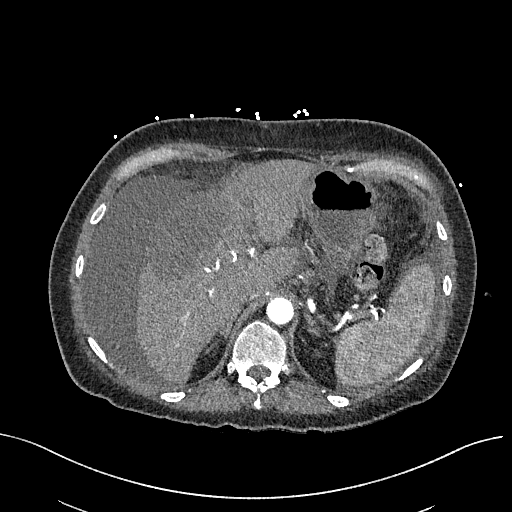
[im 123/352  lung]
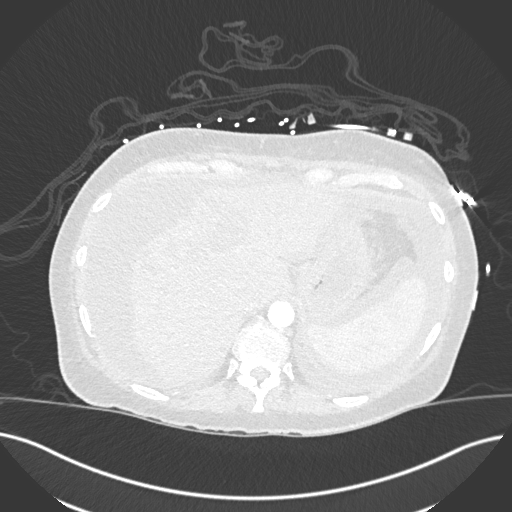
[im 138/352  soft-tissue]
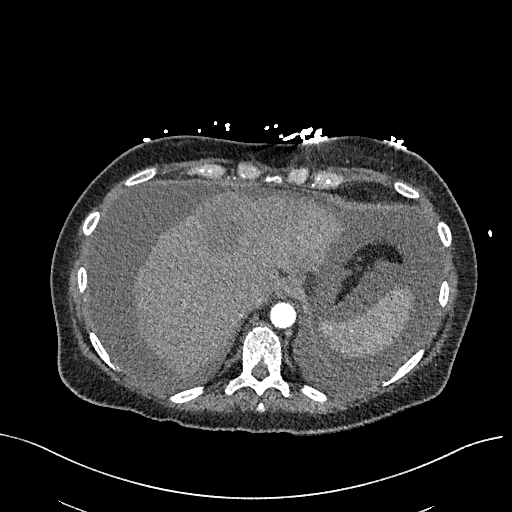
[im 168/352  lung]
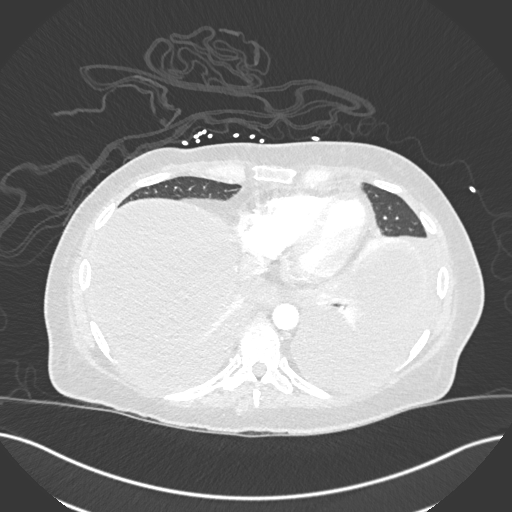
[im 184/352  soft-tissue]
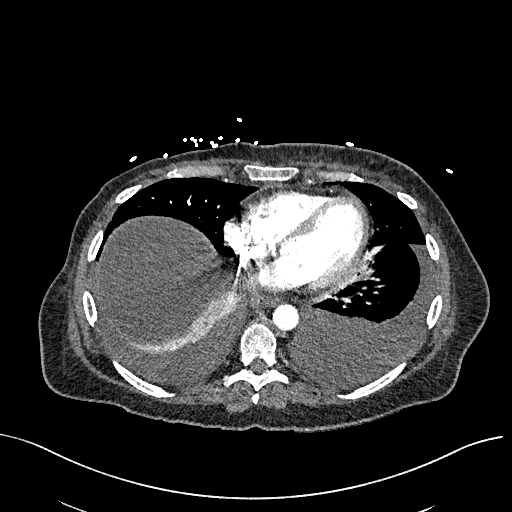
[im 214/352  lung]
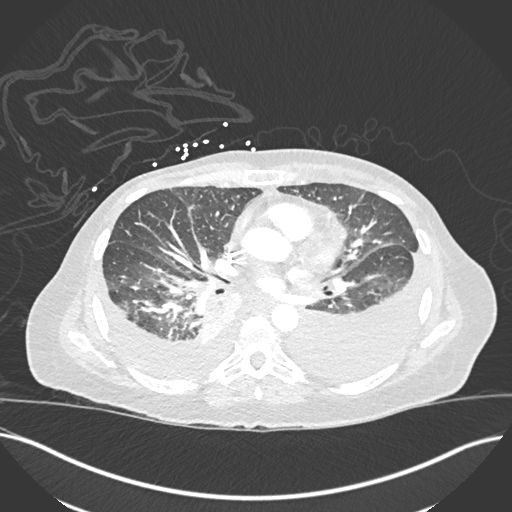
[im 229/352  soft-tissue]
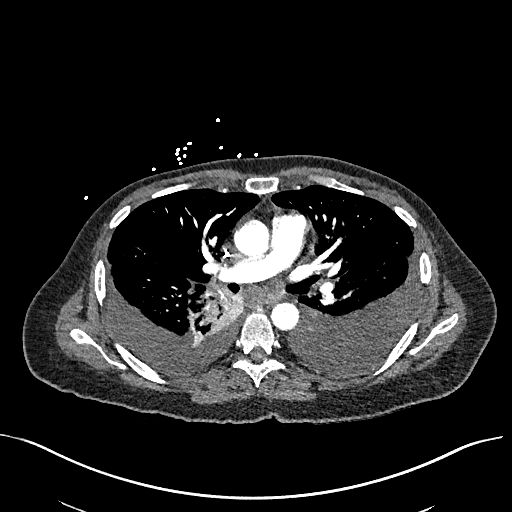
[im 260/352  lung]
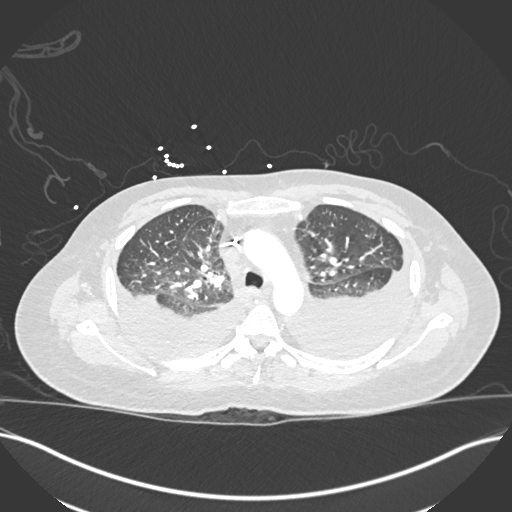
[im 290/352  soft-tissue]
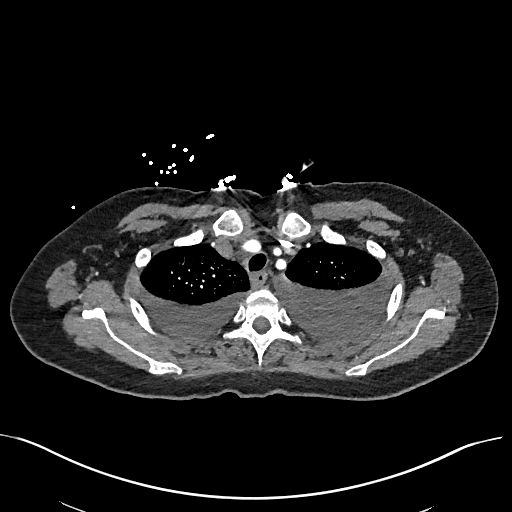
[im 306/352  lung]
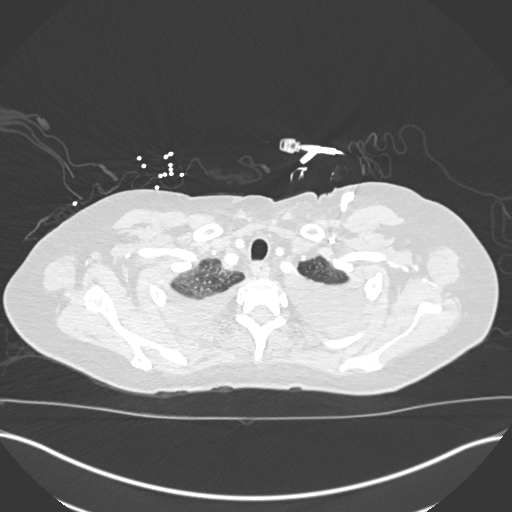
[im 336/352  soft-tissue]
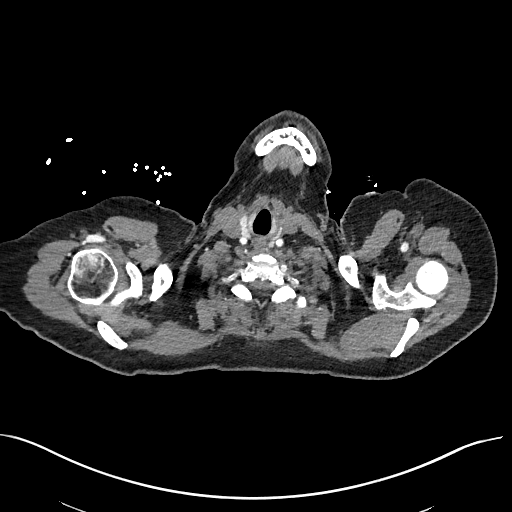

[Series 5: cor soft · coronal · 0.72mm/px · 3 of 150 slices shown]
[im 38/150  soft-tissue]
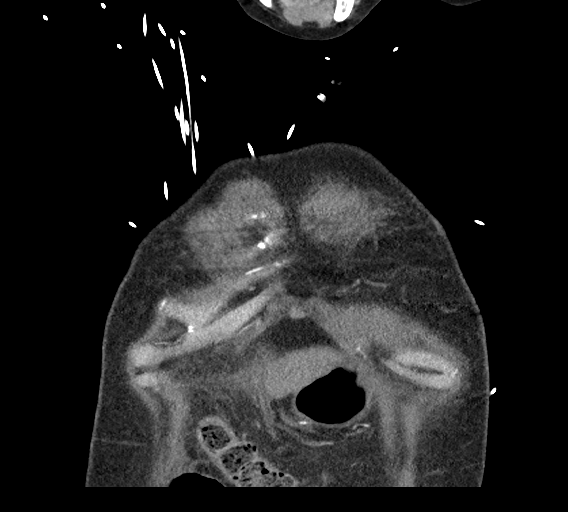
[im 75/150  soft-tissue]
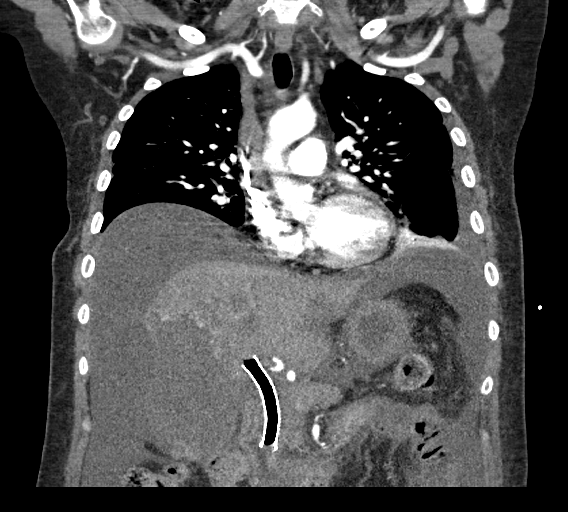
[im 112/150  soft-tissue]
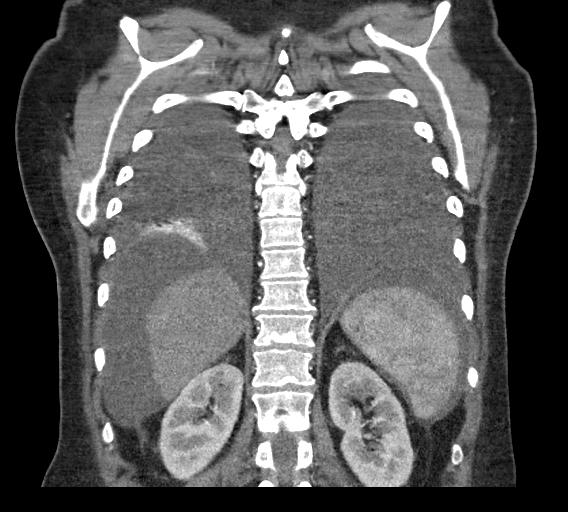

[17 of 46 positions shown; findings below may reference images not displayed]

Abdomen pelvis
radiograph dated [DATE]. Abdomen and pelvis CT dated [DATE].
Chest CTA dated [DATE].
FINDINGS: CTA CHEST FINDINGS

Cardiovascular: Satisfactory opacification of the pulmonary arteries
to the segmental level. No evidence of pulmonary embolism. Normal
heart size. No pericardial effusion.

Mediastinum/Nodes: No enlarged mediastinal, hilar, or axillary lymph
nodes. Thyroid gland, trachea, and esophagus demonstrate no
significant findings.

Lungs/Pleura: Moderate-sized bilateral pleural effusions, greater on
the left. These have both increased in amount. Bilateral lower lobe
compressive atelectasis, greater on the right. Increased prominence
of the interstitial markings in both lungs.

Musculoskeletal: Mild thoracic spine degenerative changes. No
evidence of bony metastatic disease.

Review of the MIP images confirms the above findings.

CT ABDOMEN and PELVIS FINDINGS

Hepatobiliary: Multiple low density liver masses are again
demonstrated. The largest is in the anterior right lobe, measuring
9.1 x 6.4 cm on image number 29 series 4. On [DATE], this
measured 11.2 x 6.9 cm in corresponding dimensions. There are
multiple new masses more superiorly. The largest measures 6.9 x
cm on image number 19 series 4.

Normal sized gallbladder with mild diffuse wall thickening and
enhancement, similar to the previous examination. A common duct
stent remains in place. Interval mild intrahepatic biliary ductal
dilatation in the lateral segment left lobe.

Pancreas: Moderate diffuse pancreatic atrophy.

Spleen: Normal in size without focal abnormality.

Adrenals/Urinary Tract: Adrenal glands are unremarkable. Kidneys are
normal, without renal calculi, focal lesion, or hydronephrosis.
Bladder is unremarkable.

Stomach/Bowel: Stomach is within normal limits. Appendix appears
normal. No evidence of bowel wall thickening, distention, or
inflammatory changes.

Vascular/Lymphatic: No significant vascular findings are present. No
enlarged abdominal or pelvic lymph nodes.

Reproductive: Status post hysterectomy. No adnexal masses.

Other: Moderate to large amount of ascites, significantly increased.

Musculoskeletal: Mild lumbar spine degenerative changes. No evidence
of bony metastatic disease.

Review of the MIP images confirms the above findings.
IMPRESSION: 1. No pulmonary emboli.
2. Interval increase in moderate-sized bilateral pleural effusions,
greater on the left.
3. Bilateral lower lobe compressive atelectasis, greater on the
right.
4. Increased prominence of the interstitial markings in both lungs,
compatible with interstitial pulmonary edema.
5. Progressive patent metastatic disease.
6. Interval mild intrahepatic biliary ductal dilatation in the
lateral segment left lobe of the liver compatible with partial
obstruction by the metastases.
7. Moderate to large amount of ascites, significantly increased.

## 2019-04-10 IMAGING — DX DG CHEST 1V PORT
1 series · 1 of 1 positions shown · non-contrast
Comparison: [DATE]

CLINICAL DATA: Shortness of breath. Diffuse chest pain.

EXAM:
PORTABLE CHEST 1 VIEW

[chest ap]
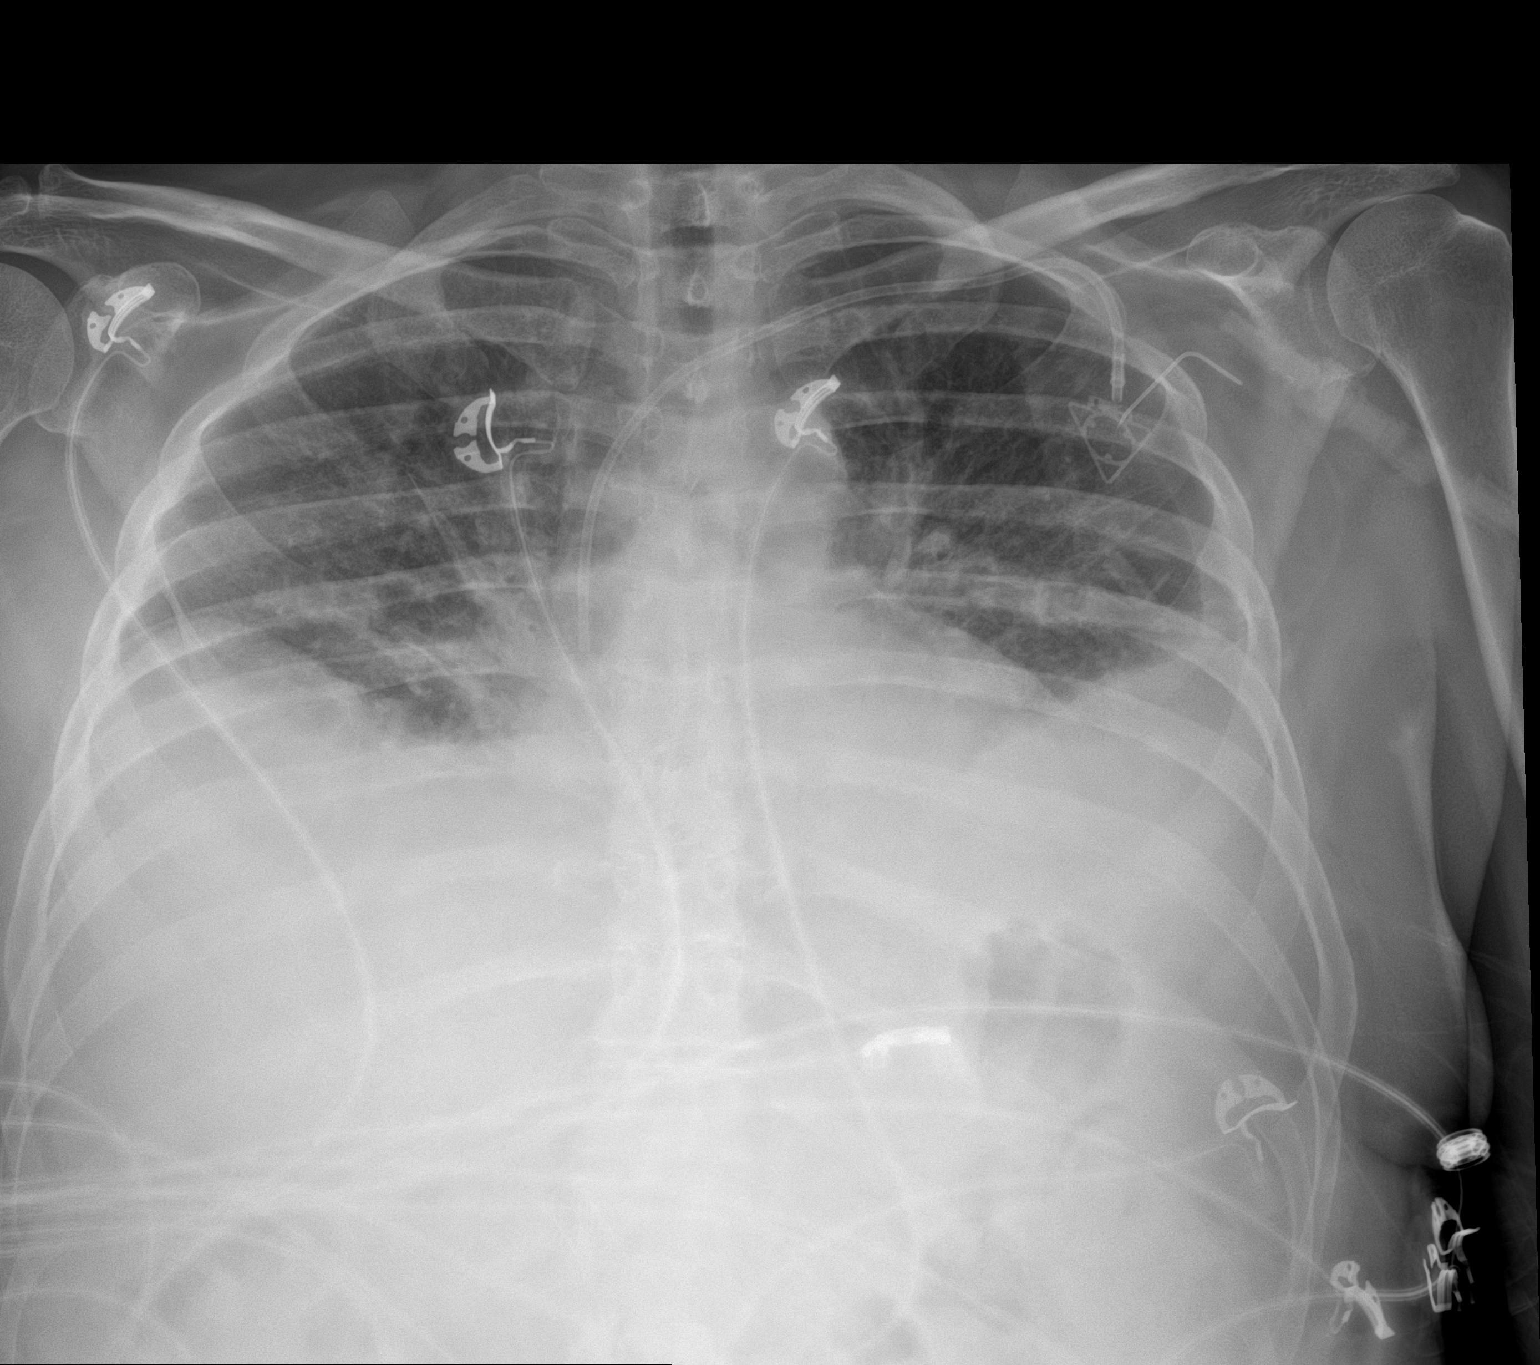

[1 of 1 positions shown; findings below may reference images not displayed]

FINDINGS: Poor inspiration. Normal sized heart. Bibasilar atelectasis. Small
left pleural effusion. Stable left subclavian porta catheter.
Unremarkable bones.
IMPRESSION: 1. Stable small left pleural effusion.
2. Poor inspiration with bibasilar atelectasis.

## 2019-04-10 IMAGING — CT CT ABD-PELV W/ CM
2 of 4 series · 15 of 46 positions shown, 17 images · IV contrast (omnipaque)
Comparison: Portable chest obtained earlier today.

CLINICAL DATA: Diffuse chest pain for the past 2 weeks, worse this
morning. Undergoing chemotherapy. Metastatic breast cancer.
Abdominal distension.

EXAM:
CT ANGIOGRAPHY CHEST
CT ABDOMEN AND PELVIS WITH CONTRAST
TECHNIQUE: Multidetector CT imaging of the chest was performed using the
standard protocol during bolus administration of intravenous
contrast. Multiplanar CT image reconstructions and MIPs were
obtained to evaluate the vascular anatomy. Multidetector CT imaging
of the abdomen and pelvis was performed using the standard protocol
during bolus administration of intravenous contrast.
CONTRAST:  100mL OMNIPAQUE IOHEXOL 350 MG/ML SOLN

[Series 4: abd/pel w/ · axial · 0.83mm/px · z∈[+694,+1144]mm · 12 of 100 slices shown, 14 images]
[im 5/100  soft-tissue]
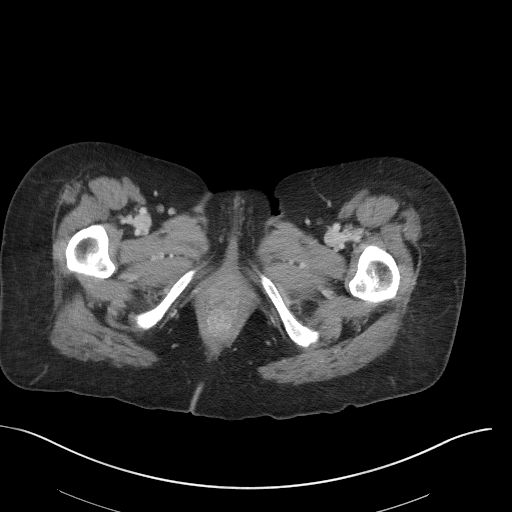
[im 5/100  bone]
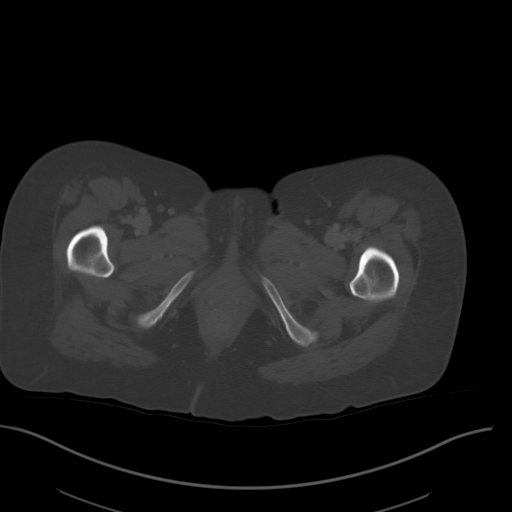
[im 15/100  soft-tissue]
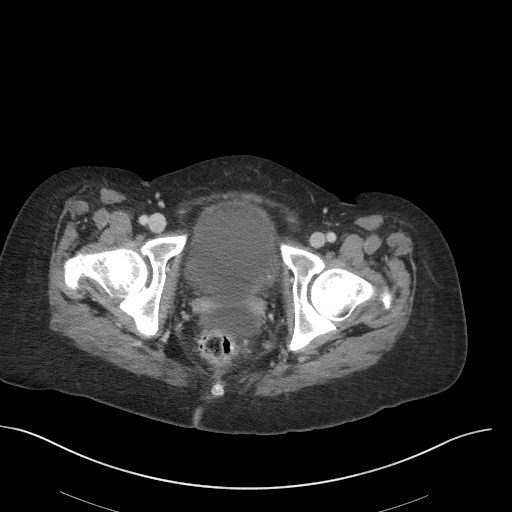
[im 24/100  soft-tissue]
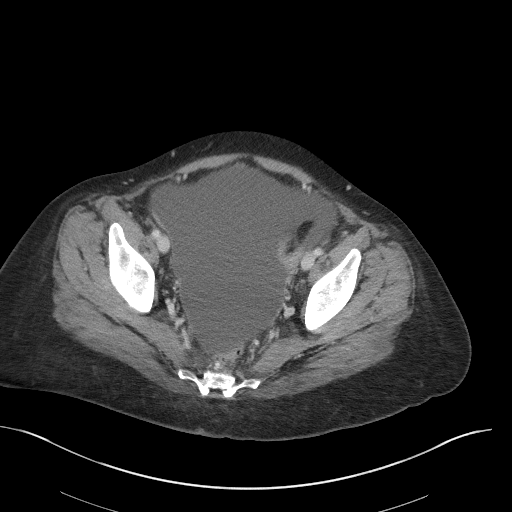
[im 29/100  soft-tissue]
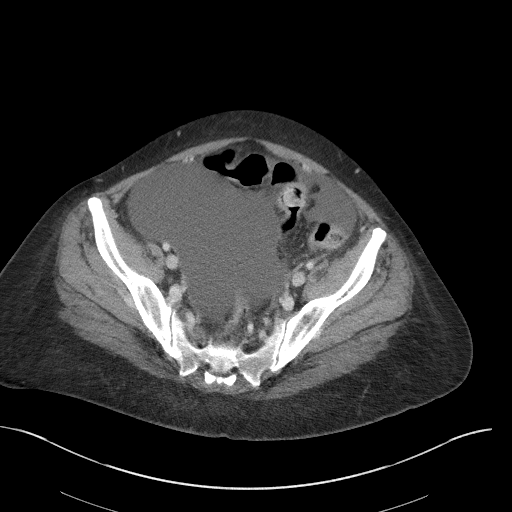
[im 38/100  soft-tissue]
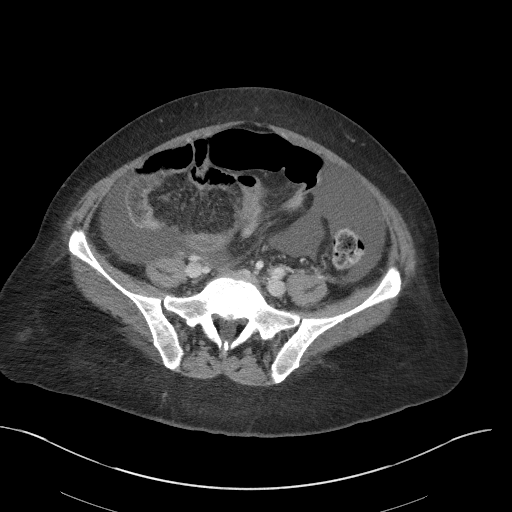
[im 48/100  soft-tissue]
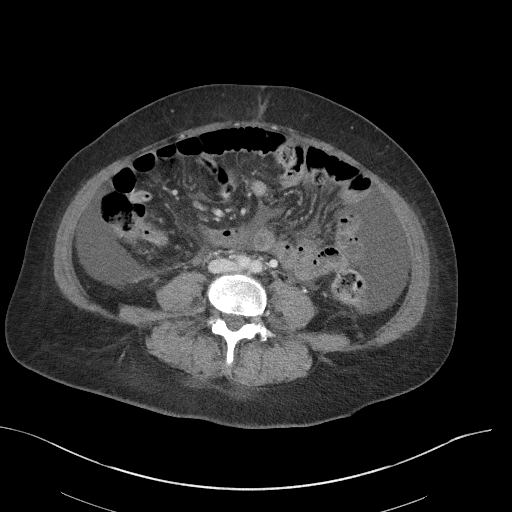
[im 52/100  soft-tissue]
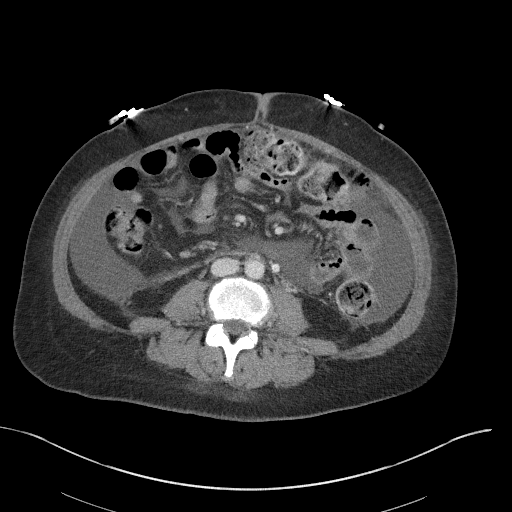
[im 62/100  soft-tissue]
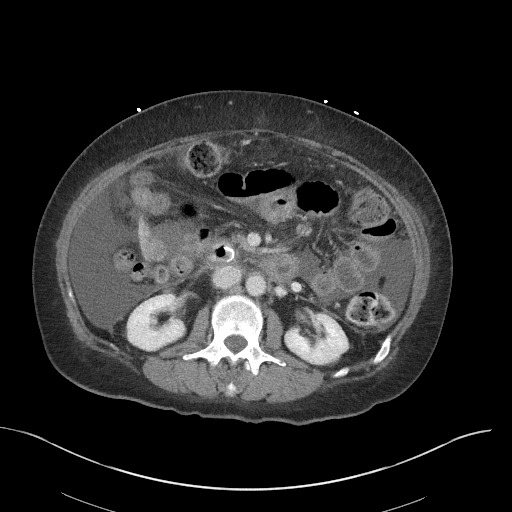
[im 71/100  soft-tissue]
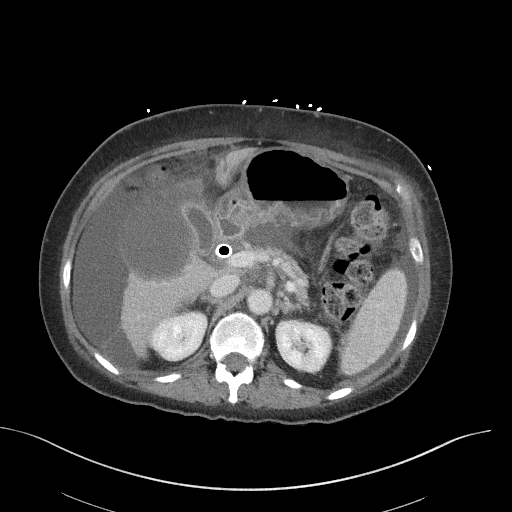
[im 71/100  bone]
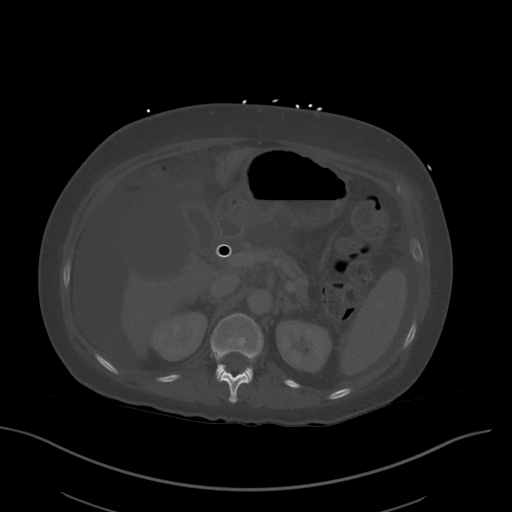
[im 76/100  soft-tissue]
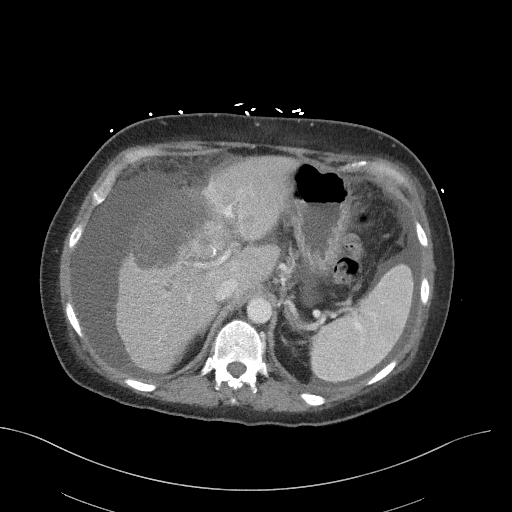
[im 85/100  soft-tissue]
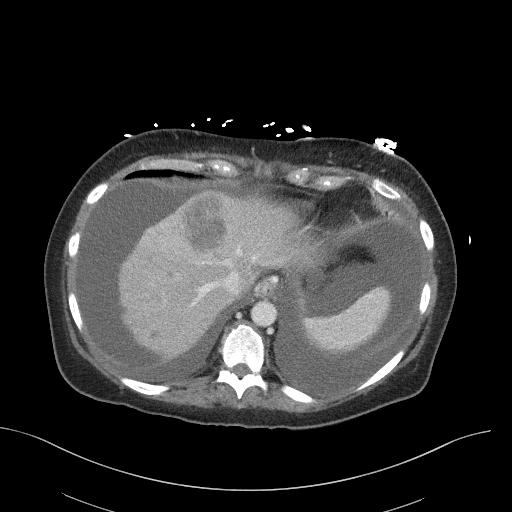
[im 95/100  soft-tissue]
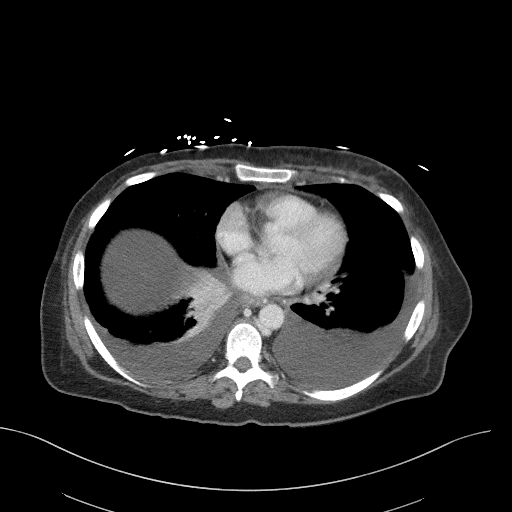

[Series 6: coronal · coronal · 0.80mm/px · 3 of 110 slices shown]
[im 37/110  soft-tissue]
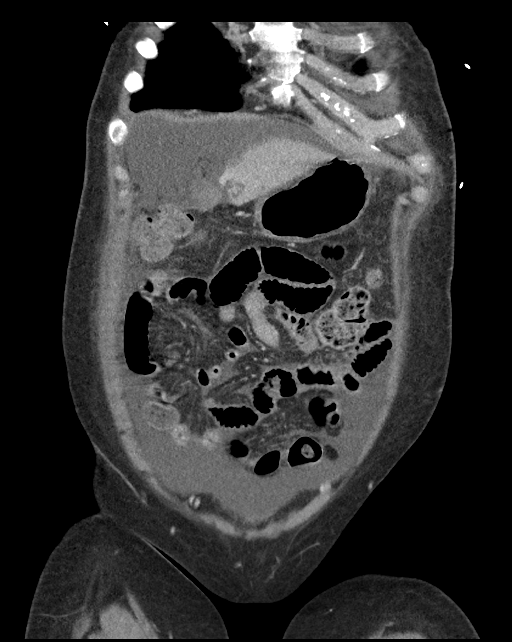
[im 49/110  soft-tissue]
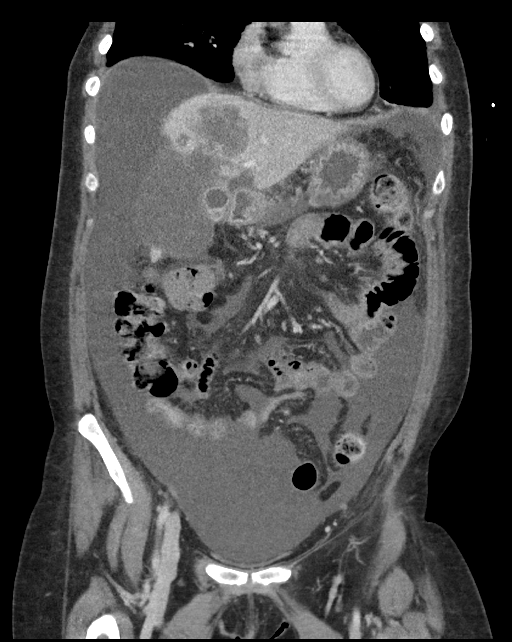
[im 61/110  soft-tissue]
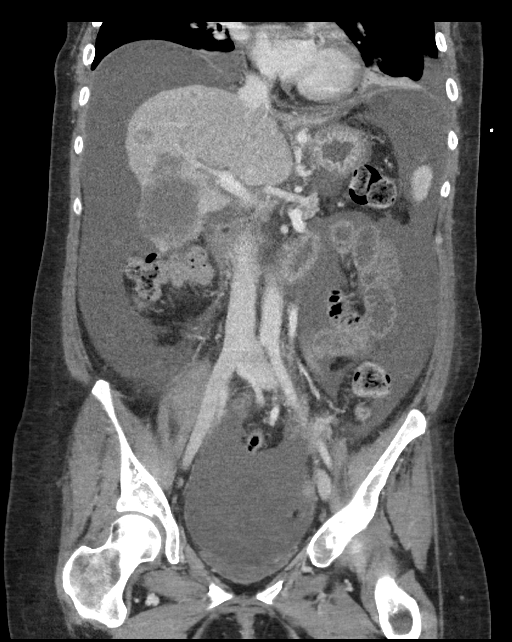

[15 of 46 positions shown; findings below may reference images not displayed]

Abdomen pelvis
radiograph dated [DATE]. Abdomen and pelvis CT dated [DATE].
Chest CTA dated [DATE].
FINDINGS: CTA CHEST FINDINGS

Cardiovascular: Satisfactory opacification of the pulmonary arteries
to the segmental level. No evidence of pulmonary embolism. Normal
heart size. No pericardial effusion.

Mediastinum/Nodes: No enlarged mediastinal, hilar, or axillary lymph
nodes. Thyroid gland, trachea, and esophagus demonstrate no
significant findings.

Lungs/Pleura: Moderate-sized bilateral pleural effusions, greater on
the left. These have both increased in amount. Bilateral lower lobe
compressive atelectasis, greater on the right. Increased prominence
of the interstitial markings in both lungs.

Musculoskeletal: Mild thoracic spine degenerative changes. No
evidence of bony metastatic disease.

Review of the MIP images confirms the above findings.

CT ABDOMEN and PELVIS FINDINGS

Hepatobiliary: Multiple low density liver masses are again
demonstrated. The largest is in the anterior right lobe, measuring
9.1 x 6.4 cm on image number 29 series 4. On [DATE], this
measured 11.2 x 6.9 cm in corresponding dimensions. There are
multiple new masses more superiorly. The largest measures 6.9 x
cm on image number 19 series 4.

Normal sized gallbladder with mild diffuse wall thickening and
enhancement, similar to the previous examination. A common duct
stent remains in place. Interval mild intrahepatic biliary ductal
dilatation in the lateral segment left lobe.

Pancreas: Moderate diffuse pancreatic atrophy.

Spleen: Normal in size without focal abnormality.

Adrenals/Urinary Tract: Adrenal glands are unremarkable. Kidneys are
normal, without renal calculi, focal lesion, or hydronephrosis.
Bladder is unremarkable.

Stomach/Bowel: Stomach is within normal limits. Appendix appears
normal. No evidence of bowel wall thickening, distention, or
inflammatory changes.

Vascular/Lymphatic: No significant vascular findings are present. No
enlarged abdominal or pelvic lymph nodes.

Reproductive: Status post hysterectomy. No adnexal masses.

Other: Moderate to large amount of ascites, significantly increased.

Musculoskeletal: Mild lumbar spine degenerative changes. No evidence
of bony metastatic disease.

Review of the MIP images confirms the above findings.
IMPRESSION: 1. No pulmonary emboli.
2. Interval increase in moderate-sized bilateral pleural effusions,
greater on the left.
3. Bilateral lower lobe compressive atelectasis, greater on the
right.
4. Increased prominence of the interstitial markings in both lungs,
compatible with interstitial pulmonary edema.
5. Progressive patent metastatic disease.
6. Interval mild intrahepatic biliary ductal dilatation in the
lateral segment left lobe of the liver compatible with partial
obstruction by the metastases.
7. Moderate to large amount of ascites, significantly increased.

## 2019-04-10 MED ORDER — METOCLOPRAMIDE HCL 5 MG/ML IJ SOLN
10.0000 mg | Freq: Once | INTRAMUSCULAR | Status: AC
  Administered 2019-04-10: 10 mg via INTRAVENOUS
  Filled 2019-04-10: qty 2

## 2019-04-10 MED ORDER — ONDANSETRON HCL 4 MG/2ML IJ SOLN
4.0000 mg | Freq: Once | INTRAMUSCULAR | Status: AC
  Administered 2019-04-10: 4 mg via INTRAVENOUS
  Filled 2019-04-10: qty 2

## 2019-04-10 MED ORDER — PANTOPRAZOLE SODIUM 40 MG PO TBEC
40.0000 mg | DELAYED_RELEASE_TABLET | Freq: Every day | ORAL | Status: DC
  Administered 2019-04-11: 40 mg via ORAL
  Filled 2019-04-10: qty 1

## 2019-04-10 MED ORDER — POTASSIUM CHLORIDE IN NACL 40-0.9 MEQ/L-% IV SOLN
INTRAVENOUS | Status: AC
  Administered 2019-04-10 – 2019-04-11 (×3): 100 mL/h via INTRAVENOUS
  Filled 2019-04-10 (×5): qty 1000

## 2019-04-10 MED ORDER — IOHEXOL 350 MG/ML SOLN
100.0000 mL | Freq: Once | INTRAVENOUS | Status: AC | PRN
  Administered 2019-04-10: 100 mL via INTRAVENOUS

## 2019-04-10 MED ORDER — SODIUM CHLORIDE 0.9 % IV BOLUS
1000.0000 mL | Freq: Once | INTRAVENOUS | Status: AC
  Administered 2019-04-10: 1000 mL via INTRAVENOUS

## 2019-04-10 MED ORDER — HYDROXYZINE HCL 50 MG/ML IM SOLN
25.0000 mg | Freq: Once | INTRAMUSCULAR | Status: AC
  Administered 2019-04-10: 25 mg via INTRAMUSCULAR
  Filled 2019-04-10: qty 1

## 2019-04-10 MED ORDER — SODIUM CHLORIDE 0.9 % IV SOLN: INTRAVENOUS | Status: DC

## 2019-04-10 MED ORDER — PROCHLORPERAZINE EDISYLATE 10 MG/2ML IJ SOLN
10.0000 mg | Freq: Four times a day (QID) | INTRAMUSCULAR | Status: DC | PRN
  Administered 2019-04-10 – 2019-04-11 (×4): 10 mg via INTRAVENOUS
  Filled 2019-04-10 (×5): qty 2

## 2019-04-10 MED ORDER — OXYCODONE-ACETAMINOPHEN 5-325 MG PO TABS
1.0000 | ORAL_TABLET | ORAL | Status: DC | PRN
  Administered 2019-04-10 – 2019-04-11 (×3): 1 via ORAL
  Filled 2019-04-10 (×3): qty 1

## 2019-04-10 MED ORDER — FENTANYL CITRATE (PF) 100 MCG/2ML IJ SOLN
50.0000 ug | INTRAMUSCULAR | Status: AC | PRN
  Administered 2019-04-10 (×3): 50 ug via INTRAVENOUS
  Filled 2019-04-10 (×3): qty 2

## 2019-04-10 MED ORDER — PROMETHAZINE HCL 25 MG/ML IJ SOLN
12.5000 mg | Freq: Once | INTRAMUSCULAR | Status: AC
  Administered 2019-04-10: 12.5 mg via INTRAVENOUS
  Filled 2019-04-10: qty 1

## 2019-04-10 MED ORDER — HEPARIN SODIUM (PORCINE) 5000 UNIT/ML IJ SOLN
5000.0000 [IU] | Freq: Three times a day (TID) | INTRAMUSCULAR | Status: DC
  Administered 2019-04-10 – 2019-04-11 (×4): 5000 [IU] via SUBCUTANEOUS
  Filled 2019-04-10 (×4): qty 1

## 2019-04-10 MED ORDER — ACETAMINOPHEN 325 MG PO TABS: 650.0000 mg | ORAL_TABLET | Freq: Four times a day (QID) | ORAL | Status: DC | PRN

## 2019-04-10 MED ORDER — GABAPENTIN 300 MG PO CAPS
300.0000 mg | ORAL_CAPSULE | Freq: Every day | ORAL | Status: DC
  Administered 2019-04-10: 300 mg via ORAL
  Filled 2019-04-10: qty 1

## 2019-04-10 MED ORDER — ACETAMINOPHEN 650 MG RE SUPP: 650.0000 mg | Freq: Four times a day (QID) | RECTAL | Status: DC | PRN

## 2019-04-10 MED ORDER — ESCITALOPRAM OXALATE 10 MG PO TABS
20.0000 mg | ORAL_TABLET | Freq: Every day | ORAL | Status: DC
  Administered 2019-04-11: 20 mg via ORAL
  Filled 2019-04-10: qty 2

## 2019-04-10 MED ORDER — LORAZEPAM 2 MG/ML IJ SOLN
0.5000 mg | Freq: Once | INTRAMUSCULAR | Status: AC
  Administered 2019-04-10: 0.5 mg via INTRAVENOUS
  Filled 2019-04-10: qty 1

## 2019-04-10 NOTE — ED Provider Notes (Signed)
Patient rechecked at 0800: Feeling slightly better.  Will continue IV fluids.   Nat Christen, MD 04/10/19 [phone]

## 2019-04-10 NOTE — ED Provider Notes (Signed)
Patient rechecked at 1045: She is still tachycardic and feels nauseated and weak.  Will admit.   Nat Christen, MD 04/10/19 1057

## 2019-04-10 NOTE — ED Notes (Signed)
ED Provider at bedside. 

## 2019-04-10 NOTE — H&P (Signed)
History and Physical    Pamela Long [id number] DOB: 07-29-1970 DOA: 04/10/2019  Referring MD/NP/PA: Dr. Lacinda Axon PCP: Doree Albee, MD  Patient coming from: Home  Chief Complaint: generalized weakness, chest pain, intractable nausea and vomiting.  HPI: Pamela Long is a 48 y.o. female with PMH significant for metastatic breast cancer, anxiety/depression, GERD, HTN and asthma; who presented to ED with complaints of generalized weakness and intractable nausea and vomiting. Patient symptoms have present for the last 4 days or so and worsening. Unable to keep anything down currently. Patient expressed pleuritic chest discomfort, mid chest, worse with vomiting and movement, radiated to her back and without any other associated or aggravated factors. No hematemesis, no fever, no dysuria, no hematuria, no focal weakness, no headaches, no blurred vision, no SOB and no recent contacts with positive COVID patients.  In the ED patient found tachycardic, hypokalemic and actively vomiting during evaluation. CT chest and abdomen demonstrated extensive metastatic disease and small unchanged pleural effusion; no bowel obstruction or other acute abnormality appreciated. Despite IVF's and antiemetics, she continue to be tachycardic and having nausea. TRH contacted to place in the hospital for further evaluation and management.  Past Medical/Surgical History: Past Medical History:  Diagnosis Date   Anemia    Anxiety    Asthma    Breast cancer ([place])    Left, 0000000   Complication of anesthesia    Depression    GERD (gastroesophageal reflux disease)    Migraine    OAB (overactive bladder)    PONV (postoperative nausea and vomiting)    Seasonal allergies     Past Surgical History:  Procedure Laterality Date   ABDOMINAL HYSTERECTOMY     breast cancer   ANKLE RECONSTRUCTION Right 1989   BILIARY STENT PLACEMENT N/A 09/22/2018   Procedure: BILIARY STENT PLACEMENT;  Surgeon:  Rogene Houston, MD;  [facility];  Service: Endoscopy;  Laterality: N/A;   BILIARY STENT PLACEMENT N/A 03/16/2019   Procedure: BILIARY STENT EXCHANGE;  Surgeon: Rogene Houston, MD;  Location: [facility];  Service: Endoscopy;  Laterality: N/A;   BREAST SURGERY     COLONOSCOPY WITH PROPOFOL N/A 08/06/2018   Dr. Oneida Alar: small internal hemorrhoids, moderate external hemorrhoids. rectal bleeding due to ?anal fissure vs hemorrhoids. nex tcs 10 years   ERCP N/A 09/22/2018   Procedure: ENDOSCOPIC RETROGRADE CHOLANGIOPANCREATOGRAPHY (ERCP);  Surgeon: Rogene Houston, MD;  Location: [facility];  Service: Endoscopy;  Laterality: N/A;   ERCP N/A 03/16/2019   Procedure: ENDOSCOPIC RETROGRADE CHOLANGIOPANCREATOGRAPHY (ERCP);  Surgeon: Rogene Houston, MD;  [facility]: [facility];  Service: Endoscopy;  Laterality: N/A;   FOREIGN BODY REMOVAL N/A 08/29/2017   from lip   KNEE SURGERY Right 1989   bone spur   MASTECTOMY  2017   bil mastectomies   PORTACATH PLACEMENT Left 12/23/2018   Procedure: INSERTION PORT-A-CATH LEFT SUBCLAVIAN;  Surgeon: Aviva Signs, MD;  Location: [facility];  Service: General;  Laterality: Left;   SINUSOTOMY      Social History:  reports that she has never smoked. She has never used smokeless tobacco. She reports that she does not drink alcohol or use drugs.  Allergies: Allergies  Allergen Reactions   Salagen [Pilocarpine] Other (See Comments)    Can cause liver failure    Tape Itching and Other (See Comments)    Depending on the adhesive-blistering occurs   Amoxicillin Hives and Other (See Comments)    Rash only DID THE REACTION  INVOLVE: Swelling of the face/tongue/throat, SOB, or low BP? Sudden or severe rash/hives, skin peeling, or the inside of the mouth or nose?  Did it require medical treatment?  When did it last happen? If all above answers are "NO", may proceed with cephalosporin use.    Caffeine Diarrhea, Nausea Only,  Palpitations and Other (See Comments)    Headache   Tetanus Toxoids Swelling and Other (See Comments)    Local reaction    Family History:  Family History  Problem Relation Age of Onset   Arthritis Mother    COPD Mother    Depression Mother    Diabetes Mother    Kidney disease Father    Heart disease Father 73   Drug abuse Father    Hypertension Father    Diabetes Daughter    Hashimoto's thyroiditis Daughter    Irritable bowel syndrome Daughter    Autism spectrum disorder Daughter    Early death Maternal Grandmother        drowned   Early death Maternal Grandfather    Heart disease Maternal Grandfather    Heart disease Paternal Grandfather    Breast cancer Maternal Aunt    Breast cancer Cousin    Lung cancer Maternal Aunt    Lung cancer Maternal Uncle    Colon cancer Neg Hx     Prior to Admission medications   Medication Sig Start Date End Date Taking? Authorizing Provider  escitalopram (LEXAPRO) 20 MG tablet Take 1 tablet (20 mg total) by mouth daily. 02/12/17  Yes Raylene Everts, MD  furosemide (LASIX) 20 MG tablet TAKE 1 TABLET BY MOUTH EVERY DAY 03/16/19  Yes Derek Jack, MD  gabapentin (NEURONTIN) 300 MG capsule Take 1 capsule (300 mg total) by mouth at bedtime. 01/07/19  Yes Derek Jack, MD  lubiprostone (AMITIZA) 8 MCG capsule Take 1 capsule (8 mcg total) by mouth 2 (two) times daily with a meal. 03/02/19  Yes Mahala Menghini, PA-C  magic mouthwash w/lidocaine SOLN Take 5 mLs by mouth 4 (four) times daily as needed for mouth pain. [id number]  Yes Delora Fuel, MD  magnesium oxide (MAG-OX) 400 MG tablet TAKE 1 TABLET BY MOUTH EVERY DAY 12/21/18  Yes Lockamy, Randi L, NP-C  nystatin-triamcinolone (MYCOLOG II) cream Apply 1 application topically 2 (two) times daily.  02/24/19  Yes [provider]  oxyCODONE-acetaminophen (PERCOCET/ROXICET) 5-325 MG tablet Take 1 tablet by mouth every 4 (four) hours as needed for severe pain.  03/25/19  Yes Derek Jack, MD  PACLitaxel (TAXOL IV) Inject into the vein every 14 (fourteen) days.    Yes [provider]  pantoprazole (PROTONIX) 40 MG tablet TAKE 1 TABLET BY MOUTH EVERY DAY BEFORE BREAKFAST 02/18/19  Yes Derek Jack, MD  potassium chloride (KLOR-CON) 10 MEQ tablet Take 10 mEq by mouth 2 (two) times daily.   Yes [provider]  prochlorperazine (COMPAZINE) 10 MG tablet Take 1 tablet (10 mg total) by mouth every 6 (six) hours as needed for nausea or vomiting. 04/08/19  Yes Derek Jack, MD  scopolamine (TRANSDERM-SCOP) 1 MG/3DAYS Place 1 patch (1.5 mg total) onto the skin every 3 (three) days. 01/18/19  Yes Derek Jack, MD  spironolactone (ALDACTONE) 25 MG tablet TAKE 1 TABLET BY MOUTH EVERY DAY 03/16/19  Yes Derek Jack, MD  temazepam (RESTORIL) 15 MG capsule TAKE 1 CAPSULE (15 MG TOTAL) BY MOUTH AT BEDTIME AS NEEDED FOR SLEEP. Patient taking differently: Take 15 mg by mouth at bedtime.  02/22/19  Yes Katragadda,  Dirk Dress, MD  traMADol (ULTRAM) 50 MG tablet Take 1 tablet (50 mg total) by mouth 2 (two) times daily as needed. 03/25/19  Yes Derek Jack, MD    Review of Systems:  Negative except as otherwise mentioned on HPI.   Physical Exam: Vitals:   04/10/19 1030 04/10/19 1100 04/10/19 1115 04/10/19 1130  BP: 118/79 115/79  95/64  Pulse:      Resp: 16 17 19  (!) 22  Temp:      TempSrc:      SpO2:      Weight:      Height:          Constitutional: in mild distress and discomfort due to ongoing nausea, weakness feeling and palpitations.  Eyes: PERRL, lids and conjunctivae normal, no icterus.  ENMT: Mucous membranes are dry. Posterior pharynx clear of any exudate or lesions. Patient with decrease skin turgor, pale and chronically ill on appearance.   Neck: normal, supple, no masses, no thyromegaly, no JVD Respiratory: clear to auscultation bilaterally, no wheezing, no crackles. Normal respiratory  effort. No accessory muscle use.  Cardiovascular: sinus tachycardia, no murmurs / rubs / gallops.Trace lower extremity edema. 2+ pedal pulses. No carotid bruits.  Abdomen: no tenderness, no masses palpated. No guarding and soft on palpation. Bowel sounds positive.  Musculoskeletal: no clubbing / cyanosis. No joint deformity upper and lower extremities. Good ROM, no contractures. Normal muscle tone.  Skin: no rashes, lesions, ulcers. No induration Neurologic: CN 2-12 grossly intact. Sensation intact, DTR normal. Strength 5/5 in all 4.  Psychiatric: Normal judgment and insight. Alert and oriented x 3. Normal mood.    Labs on Admission: I have personally reviewed the following labs and imaging studies  CBC: Recent Labs  Lab 04/05/19 1031 04/08/19 0818 04/10/19 0412  WBC 2.9* 2.3* 2.8*  NEUTROABS 1.9 1.1* 1.2*  HGB 11.2* 11.4* 11.3*  HCT 35.5* 36.7 36.2  MCV 82.4 82.5 81.9  PLT 220 212 [id number]   Basic Metabolic Panel: Recent Labs  Lab 04/05/19 1031 04/08/19 0818 04/10/19 0412 04/10/19 0630  NA 137 137 136  --   K 3.1* 3.0* 3.0*  --   CL 101 101 103  --   CO2 25 24 24   --   GLUCOSE 99 102* 95  --   BUN 8 8 7   --   CREATININE 0.56 0.58 0.52  --   CALCIUM 9.0 8.7* 8.4*  --   MG 1.5*  --   --  1.8   GFR: Estimated Creatinine Clearance: 85.7 mL/min (by C-G formula based on SCr of 0.52 mg/dL).   Liver Function Tests: Recent Labs  Lab 04/05/19 1031 04/08/19 0818 04/10/19 0412  AST 100* 109* 105*  ALT 40 41 36  ALKPHOS 270* 305* 293*  BILITOT 1.6* 1.9* 1.6*  PROT 6.6 6.4* 6.3*  ALBUMIN 2.9* 2.7* 2.7*   Recent Labs  Lab 04/10/19 0412  LIPASE 14   Thyroid Function Tests: Recent Labs    04/10/19 0630  TSH 3.607   Urine analysis:    Component Value Date/Time   COLORURINE AMBER (A) 04/10/2019 0430   APPEARANCEUR CLEAR 04/10/2019 0430   LABSPEC 1.025 04/10/2019 0430   PHURINE 5.0 04/10/2019 0430   GLUCOSEU NEGATIVE 04/10/2019 0430   HGBUR NEGATIVE 04/10/2019 0430    BILIRUBINUR NEGATIVE 04/10/2019 0430   KETONESUR 20 (A) 04/10/2019 0430   PROTEINUR NEGATIVE 04/10/2019 0430   NITRITE NEGATIVE 04/10/2019 0430   LEUKOCYTESUR NEGATIVE 04/10/2019 0430    Recent Results (from the  past 240 hour(s))  SARS Coronavirus 2 by RT PCR (hospital order, performed in [facility] hospital lab) Nasopharyngeal Nasopharyngeal Swab     Status: None   Collection Time: 04/10/19 11:21 AM   Specimen: Nasopharyngeal Swab  Result Value Ref Range Status   SARS Coronavirus 2 NEGATIVE NEGATIVE Final    Comment: (NOTE) If result is NEGATIVE SARS-CoV-2 target nucleic acids are NOT DETECTED. The SARS-CoV-2 RNA is generally detectable in upper and lower  respiratory specimens during the acute phase of infection. The lowest  concentration of SARS-CoV-2 viral copies this assay can detect is 250  copies / mL. A negative result does not preclude SARS-CoV-2 infection  and should not be used as the sole basis for treatment or other  patient management decisions.  A negative result may occur with  improper specimen collection / handling, submission of specimen other  than nasopharyngeal swab, presence of viral mutation(s) within the  areas targeted by this assay, and inadequate number of viral copies  (<250 copies / mL). A negative result must be combined with clinical  observations, patient history, and epidemiological information. If result is POSITIVE SARS-CoV-2 target nucleic acids are DETECTED. The SARS-CoV-2 RNA is generally detectable in upper and lower  respiratory specimens dur ing the acute phase of infection.  Positive  results are indicative of active infection with SARS-CoV-2.  Clinical  correlation with patient history and other diagnostic information is  necessary to determine patient infection status.  Positive results do  not rule out bacterial infection or co-infection with other viruses. If result is PRESUMPTIVE POSTIVE SARS-CoV-2 nucleic acids MAY BE PRESENT.    A presumptive positive result was obtained on the submitted specimen  and confirmed on repeat testing.  While 2019 novel coronavirus  (SARS-CoV-2) nucleic acids may be present in the submitted sample  additional confirmatory testing may be necessary for epidemiological  and / or clinical management purposes  to differentiate between  SARS-CoV-2 and other Sarbecovirus currently known to infect humans.  If clinically indicated additional testing with an alternate test  methodology [phone]) is advised. The SARS-CoV-2 RNA is generally  detectable in upper and lower respiratory sp ecimens during the acute  phase of infection. The expected result is Negative. Fact Sheet for Patients:  StrictlyIdeas.no Fact Sheet for Healthcare Providers: BankingDealers.co.za This test is not yet approved or cleared by the [place] FDA and has been authorized for detection and/or diagnosis of SARS-CoV-2 by FDA under an Emergency Use Authorization (EUA).  This EUA will remain in effect (meaning this test can be used) for the duration of the COVID-19 declaration under Section 564(b)(1) of the Act, 21 U.S.C. section 360bbb-3(b)(1), unless the authorization is terminated or revoked sooner. Performed at [facility], [address] [place], [place] [address]      Radiological Exams on Admission: Ct Angio Chest Pe W Or Wo Contrast  Result Date: 04/10/2019 CLINICAL DATA:  Diffuse chest pain for the past 2 weeks, worse this morning. Undergoing chemotherapy. Metastatic breast cancer. Abdominal distension. EXAM: CT ANGIOGRAPHY CHEST CT ABDOMEN AND PELVIS WITH CONTRAST TECHNIQUE: Multidetector CT imaging of the chest was performed using the standard protocol during bolus administration of intravenous contrast. Multiplanar CT image reconstructions and MIPs were obtained to evaluate the vascular anatomy. Multidetector CT imaging of the abdomen and pelvis was performed  using the standard protocol during bolus administration of intravenous contrast. CONTRAST:  163mL OMNIPAQUE IOHEXOL 350 MG/ML SOLN COMPARISON:  Portable chest obtained earlier today. Abdomen pelvis radiograph dated 03/16/2019.  Abdomen and pelvis CT dated 01/28/2019. Chest CTA dated 12/13/2018. FINDINGS: CTA CHEST FINDINGS Cardiovascular: Satisfactory opacification of the pulmonary arteries to the segmental level. No evidence of pulmonary embolism. Normal heart size. No pericardial effusion. Mediastinum/Nodes: No enlarged mediastinal, hilar, or axillary lymph nodes. Thyroid gland, trachea, and esophagus demonstrate no significant findings. Lungs/Pleura: Moderate-sized bilateral pleural effusions, greater on the left. These have both increased in amount. Bilateral lower lobe compressive atelectasis, greater on the right. Increased prominence of the interstitial markings in both lungs. Musculoskeletal: Mild thoracic spine degenerative changes. No evidence of bony metastatic disease. Review of the MIP images confirms the above findings. CT ABDOMEN and PELVIS FINDINGS Hepatobiliary: Multiple low density liver masses are again demonstrated. The largest is in the anterior right lobe, measuring 9.1 x 6.4 cm on image number 29 series 4. On 01/28/2019, this measured 11.2 x 6.9 cm in corresponding dimensions. There are multiple new masses more superiorly. The largest measures 6.9 x 5.6 cm on image number 19 series 4. Normal sized gallbladder with mild diffuse wall thickening and enhancement, similar to the previous examination. A common duct stent remains in place. Interval mild intrahepatic biliary ductal dilatation in the lateral segment left lobe. Pancreas: Moderate diffuse pancreatic atrophy. Spleen: Normal in size without focal abnormality. Adrenals/Urinary Tract: Adrenal glands are unremarkable. Kidneys are normal, without renal calculi, focal lesion, or hydronephrosis. Bladder is unremarkable. Stomach/Bowel: Stomach is  within normal limits. Appendix appears normal. No evidence of bowel wall thickening, distention, or inflammatory changes. Vascular/Lymphatic: No significant vascular findings are present. No enlarged abdominal or pelvic lymph nodes. Reproductive: Status post hysterectomy. No adnexal masses. Other: Moderate to large amount of ascites, significantly increased. Musculoskeletal: Mild lumbar spine degenerative changes. No evidence of bony metastatic disease. Review of the MIP images confirms the above findings. IMPRESSION: 1. No pulmonary emboli. 2. Interval increase in moderate-sized bilateral pleural effusions, greater on the left. 3. Bilateral lower lobe compressive atelectasis, greater on the right. 4. Increased prominence of the interstitial markings in both lungs, compatible with interstitial pulmonary edema. 5. Progressive patent metastatic disease. 6. Interval mild intrahepatic biliary ductal dilatation in the lateral segment left lobe of the liver compatible with partial obstruction by the metastases. 7. Moderate to large amount of ascites, significantly increased. Electronically Signed   By: Claudie Revering M.D.   On: 04/10/2019 06:39   Ct Abdomen Pelvis W Contrast  Result Date: 04/10/2019 CLINICAL DATA:  Diffuse chest pain for the past 2 weeks, worse this morning. Undergoing chemotherapy. Metastatic breast cancer. Abdominal distension. EXAM: CT ANGIOGRAPHY CHEST CT ABDOMEN AND PELVIS WITH CONTRAST TECHNIQUE: Multidetector CT imaging of the chest was performed using the standard protocol during bolus administration of intravenous contrast. Multiplanar CT image reconstructions and MIPs were obtained to evaluate the vascular anatomy. Multidetector CT imaging of the abdomen and pelvis was performed using the standard protocol during bolus administration of intravenous contrast. CONTRAST:  132mL OMNIPAQUE IOHEXOL 350 MG/ML SOLN COMPARISON:  Portable chest obtained earlier today. Abdomen pelvis radiograph dated  03/16/2019. Abdomen and pelvis CT dated 01/28/2019. Chest CTA dated 12/13/2018. FINDINGS: CTA CHEST FINDINGS Cardiovascular: Satisfactory opacification of the pulmonary arteries to the segmental level. No evidence of pulmonary embolism. Normal heart size. No pericardial effusion. Mediastinum/Nodes: No enlarged mediastinal, hilar, or axillary lymph nodes. Thyroid gland, trachea, and esophagus demonstrate no significant findings. Lungs/Pleura: Moderate-sized bilateral pleural effusions, greater on the left. These have both increased in amount. Bilateral lower lobe compressive atelectasis, greater on the right. Increased prominence of the interstitial markings  in both lungs. Musculoskeletal: Mild thoracic spine degenerative changes. No evidence of bony metastatic disease. Review of the MIP images confirms the above findings. CT ABDOMEN and PELVIS FINDINGS Hepatobiliary: Multiple low density liver masses are again demonstrated. The largest is in the anterior right lobe, measuring 9.1 x 6.4 cm on image number 29 series 4. On 01/28/2019, this measured 11.2 x 6.9 cm in corresponding dimensions. There are multiple new masses more superiorly. The largest measures 6.9 x 5.6 cm on image number 19 series 4. Normal sized gallbladder with mild diffuse wall thickening and enhancement, similar to the previous examination. A common duct stent remains in place. Interval mild intrahepatic biliary ductal dilatation in the lateral segment left lobe. Pancreas: Moderate diffuse pancreatic atrophy. Spleen: Normal in size without focal abnormality. Adrenals/Urinary Tract: Adrenal glands are unremarkable. Kidneys are normal, without renal calculi, focal lesion, or hydronephrosis. Bladder is unremarkable. Stomach/Bowel: Stomach is within normal limits. Appendix appears normal. No evidence of bowel wall thickening, distention, or inflammatory changes. Vascular/Lymphatic: No significant vascular findings are present. No enlarged abdominal or  pelvic lymph nodes. Reproductive: Status post hysterectomy. No adnexal masses. Other: Moderate to large amount of ascites, significantly increased. Musculoskeletal: Mild lumbar spine degenerative changes. No evidence of bony metastatic disease. Review of the MIP images confirms the above findings. IMPRESSION: 1. No pulmonary emboli. 2. Interval increase in moderate-sized bilateral pleural effusions, greater on the left. 3. Bilateral lower lobe compressive atelectasis, greater on the right. 4. Increased prominence of the interstitial markings in both lungs, compatible with interstitial pulmonary edema. 5. Progressive patent metastatic disease. 6. Interval mild intrahepatic biliary ductal dilatation in the lateral segment left lobe of the liver compatible with partial obstruction by the metastases. 7. Moderate to large amount of ascites, significantly increased. Electronically Signed   By: Claudie Revering M.D.   On: 04/10/2019 06:39   Dg Chest Port 1 View  Result Date: 04/10/2019 CLINICAL DATA:  Shortness of breath. Diffuse chest pain. EXAM: PORTABLE CHEST 1 VIEW COMPARISON:  01/28/2019 FINDINGS: Poor inspiration. Normal sized heart. Bibasilar atelectasis. Small left pleural effusion. Stable left subclavian porta catheter. Unremarkable bones. IMPRESSION: 1. Stable small left pleural effusion. 2. Poor inspiration with bibasilar atelectasis. Electronically Signed   By: Claudie Revering M.D.   On: 04/10/2019 05:49    EKG: Independently reviewed. Sinus tachycardia, no acute ischemic changes.   Assessment/Plan 1-Intractable nausea and vomiting -in the setting of chemotherapy use and most likely use of narcotics. -will place in observation -use reglan X 1 and PRN compazine -provide IVF's rsuscitation and follow response -full liquid diet ordered and will advance as tolerated.  2-Depression/anxiety -no SI or hallucinations -continue home antidepressants and anxiolytics   3-GERD (gastroesophageal reflux  disease)  -continue PPI  4-Metastatic breast cancer ([place]) -continue utpatient follow up with oncology service -most recent note revealed possible changes in therapeutic agents -continue PRN analgesics  5-Severe protein-calorie malnutrition ([place]) -start feeding supplements when able to tolerate PO properly  6-chest discomfort/tachycardia -pleuritic in nature -no acute ischemic changes on EKG -will check TSH -provide IVF's resuscitation -continue PPI and antiemetics -CTA neg for PE.  7-Dehydration/hypokalemia -will provide IVF's resuscitation -replete electrolytes and follow trend.   DVT prophylaxis: heparin  Code Status: Full Code Family Communication: no family at bedside  Disposition Plan: discharge back home once N/V controlled and patient able to maintain oral hydration. Consults called: none  Admission status: observation, telemetry, LOS < 2 midnights.   Time Spent: 70 minutes.  Barton Dubois MD [facility]  Pager [phone]   04/10/2019, 5:24 PM

## 2019-04-10 NOTE — ED Provider Notes (Signed)
[facility] EMERGENCY DEPARTMENT Provider Note   CSN: [id number] Arrival date & time: 04/10/19  0346     History   Chief Complaint Chief Complaint  Patient presents with  . Chest Pain    cancer pt    HPI Pamela Long is a 48 y.o. female.     Patient presents to the emergency department for evaluation of chest pain and shortness of breath.  Patient is currently being treated for metastatic breast cancer.  She reports that she has been having nausea and vomiting for the last 2 weeks or more.  She has not been able to eat or drink much because of the nausea and vomiting.  She has been using Phenergan without much relief.  Patient has been experiencing intermittent pain in the center of her chest during this time but it has worsened tonight.  Pain is in the center of the chest and radiates through into the back.  It is worsened when she lies on her side and also when she takes a deep breath.  She feels like she cannot get enough air and when she breathes.     Past Medical History:  Diagnosis Date  . Anemia   . Anxiety   . Asthma   . Breast cancer ([place])    Left, 2017  . Complication of anesthesia   . Depression   . GERD (gastroesophageal reflux disease)   . Migraine   . OAB (overactive bladder)   . PONV (postoperative nausea and vomiting)   . Seasonal allergies     Patient Active Problem List   Diagnosis Date Noted  . 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 RUQ abdominal pain 03/11/2019  . Colitis 01/28/2019  . Chemotherapy-induced neutropenia ([place])   . Dehydration   . Lactic acid acidosis   . Nausea vomiting and diarrhea   . Status post thoracentesis   . Palliative care by specialist   . Community acquired pneumonia of left lung   . Acute on chronic respiratory failure with hypoxia ([place]) 12/13/2018  . Sepsis due to undetermined organism ([place]) 12/13/2018  . Pleural effusion on left   . HCAP (healthcare-associated pneumonia) 12/12/2018  . Febrile neutropenia ([place])  12/12/2018  . Hypokalemia 12/12/2018  . Hyponatremia 12/12/2018  . Severe protein-calorie malnutrition ([place]) 12/12/2018  . Sepsis ([place]) 12/12/2018  . Obstructive jaundice 09/21/2018  . Goals of care, counseling/discussion 09/17/2018  . Abdominal pain 09/09/2018  . RUQ pain 07/08/2018  . Metastatic breast cancer ([place]) 07/02/2018  . Liver mass 06/27/2018  . GERD (gastroesophageal reflux disease) 04/02/2018  . Rectal bleeding 04/02/2018  . Constipation 04/02/2018  . Acquired absence of left breast 08/01/2017  . OAB (overactive bladder) 07/31/2017  . Vitamin D deficiency 02/26/2017  . Prediabetes 02/26/2017  . HLD (hyperlipidemia) 02/26/2017  . Asthma 02/12/2017  . Depression 02/12/2017  . Migraines 02/12/2017  . Arthralgia 02/12/2017  . Sleep-disordered breathing 02/12/2017  . Positive ANA (antinuclear antibody) 02/12/2017  . Angular cheilitis 02/12/2017  . Class 2 obesity due to excess calories without serious comorbidity with body mass index (BMI) of 35.0 to 35.9 in adult 02/12/2017  . Malignant neoplasm of central portion of left breast in female, estrogen receptor positive ([place]) 01/05/2017    Past Surgical History:  Procedure Laterality Date  . ABDOMINAL HYSTERECTOMY     breast cancer  . ANKLE RECONSTRUCTION Right 1989  . BILIARY STENT PLACEMENT N/A 09/22/2018   Procedure: BILIARY STENT PLACEMENT;  Surgeon: Rogene Houston, MD;  Location: [facility] ENDO  SUITE;  Service: Endoscopy;  Laterality: N/A;  . BILIARY STENT PLACEMENT N/A 03/16/2019   Procedure: BILIARY STENT EXCHANGE;  Surgeon: Rogene Houston, MD;  Location: [facility] ENDO SUITE;  Service: Endoscopy;  Laterality: N/A;  . BREAST SURGERY    . COLONOSCOPY WITH PROPOFOL N/A 08/06/2018   Dr. Oneida Alar: small internal hemorrhoids, moderate external hemorrhoids. rectal bleeding due to ?anal fissure vs hemorrhoids. nex tcs 10 years  . ERCP N/A 09/22/2018   Procedure: ENDOSCOPIC RETROGRADE CHOLANGIOPANCREATOGRAPHY (ERCP);  Surgeon: Rogene Houston, MD;  Location: [facility] ENDO SUITE;  Service: Endoscopy;  Laterality: N/A;  . ERCP N/A 03/16/2019   Procedure: ENDOSCOPIC RETROGRADE CHOLANGIOPANCREATOGRAPHY (ERCP);  Surgeon: Rogene Houston, MD;  Location: [facility] ENDO SUITE;  Service: Endoscopy;  Laterality: N/A;  . FOREIGN BODY REMOVAL N/A 08/29/2017   from lip  . KNEE SURGERY Right 1989   bone spur  . MASTECTOMY  2017   bil mastectomies  . PORTACATH PLACEMENT Left 12/23/2018   Procedure: INSERTION PORT-A-CATH LEFT SUBCLAVIAN;  Surgeon: Aviva Signs, MD;  Location: [facility] ORS;  Service: General;  Laterality: Left;  . SINUSOTOMY       OB History    Gravida  1   Para  1   Term  1   Preterm      AB      Living        SAB      TAB      Ectopic      Multiple      Live Births               Home Medications    Prior to Admission medications   Medication Sig Start Date End Date Taking? Authorizing Provider  escitalopram (LEXAPRO) 20 MG tablet Take 1 tablet (20 mg total) by mouth daily. 02/12/17   Raylene Everts, MD  furosemide (LASIX) 20 MG tablet TAKE 1 TABLET BY MOUTH EVERY DAY 03/16/19   Derek Jack, MD  gabapentin (NEURONTIN) 300 MG capsule Take 1 capsule (300 mg total) by mouth at bedtime. 01/07/19   Derek Jack, MD  lubiprostone (AMITIZA) 8 MCG capsule Take 1 capsule (8 mcg total) by mouth 2 (two) times daily with a meal. 03/02/19   Mahala Menghini, PA-C  magic mouthwash w/lidocaine SOLN Take 5 mLs by mouth 4 (four) times daily as needed for mouth pain. [id number]   Delora Fuel, MD  magnesium oxide (MAG-OX) 400 MG tablet TAKE 1 TABLET BY MOUTH EVERY DAY 12/21/18   Lockamy, Randi L, NP-C  nystatin-triamcinolone (MYCOLOG II) cream Apply 1 application topically 2 (two) times daily.  02/24/19   [provider]  oxyCODONE-acetaminophen (PERCOCET/ROXICET) 5-325 MG tablet Take 1 tablet by mouth every 4 (four) hours as needed for severe pain. 03/25/19   Derek Jack, MD  PACLitaxel (TAXOL IV)  Inject into the vein every 14 (fourteen) days.     [provider]  pantoprazole (PROTONIX) 40 MG tablet TAKE 1 TABLET BY MOUTH EVERY DAY BEFORE BREAKFAST 02/18/19   Derek Jack, MD  Polyvinyl Alcohol-Povidone PF (REFRESH) 1.4-0.6 % SOLN Place 1 drop into both eyes as needed (for dry eyes).     [provider]  potassium chloride (KLOR-CON) 10 MEQ tablet Take 10 mEq by mouth 2 (two) times daily.    [provider]  prochlorperazine (COMPAZINE) 10 MG tablet Take 1 tablet (10 mg total) by mouth every 6 (six) hours as needed for nausea or vomiting. 04/08/19   Derek Jack, MD  rizatriptan (MAXALT-MLT) 10 MG disintegrating tablet Take 10 mg by mouth every 2 (two) hours as needed for migraine.     [provider]  scopolamine (TRANSDERM-SCOP) 1 MG/3DAYS Place 1 patch (1.5 mg total) onto the skin every 3 (three) days. 01/18/19   Derek Jack, MD  spironolactone (ALDACTONE) 25 MG tablet TAKE 1 TABLET BY MOUTH EVERY DAY 03/16/19   Derek Jack, MD  temazepam (RESTORIL) 15 MG capsule TAKE 1 CAPSULE (15 MG TOTAL) BY MOUTH AT BEDTIME AS NEEDED FOR SLEEP. Patient taking differently: Take 15 mg by mouth at bedtime.  02/22/19   Derek Jack, MD  traMADol (ULTRAM) 50 MG tablet Take 1 tablet (50 mg total) by mouth 2 (two) times daily as needed. 03/25/19   Derek Jack, MD    Family History Family History  Problem Relation Age of Onset  . Arthritis Mother   . COPD Mother   . Depression Mother   . Diabetes Mother   . Kidney disease Father   . Heart disease Father 58  . Drug abuse Father   . Hypertension Father   . Diabetes Daughter   . Hashimoto's thyroiditis Daughter   . Irritable bowel syndrome Daughter   . Autism spectrum disorder Daughter   . Early death Maternal Grandmother        drowned  . Early death Maternal Grandfather   . Heart disease Maternal Grandfather   . Heart disease Paternal Grandfather   . Breast  cancer Maternal Aunt   . Breast cancer Cousin   . Lung cancer Maternal Aunt   . Lung cancer Maternal Uncle   . Colon cancer Neg Hx     Social History Social History   Tobacco Use  . Smoking status: Never Smoker  . Smokeless tobacco: Never Used  Substance Use Topics  . Alcohol use: No  . Drug use: No     Allergies   Salagen [pilocarpine], Tape, Amoxicillin, Caffeine, and Tetanus toxoids   Review of Systems Review of Systems  Respiratory: Positive for shortness of breath.   Cardiovascular: Positive for chest pain.  Gastrointestinal: Positive for nausea and vomiting.  All other systems reviewed and are negative.    Physical Exam Updated Vital Signs BP 98/66   Pulse (!) 119   Temp 98.1 F (36.7 C) (Oral)   Resp 20   Ht 5\' 5"  (1.651 m)   Wt 73.9 kg   SpO2 100%   BMI 27.12 kg/m   Physical Exam Vitals signs and nursing note reviewed.  Constitutional:      General: She is not in acute distress.    Appearance: Normal appearance. She is well-developed.  HENT:     Head: Normocephalic and atraumatic.     Right Ear: Hearing normal.     Left Ear: Hearing normal.     Nose: Nose normal.  Eyes:     Conjunctiva/sclera: Conjunctivae normal.     Pupils: Pupils are equal, round, and reactive to light.  Neck:     Musculoskeletal: Normal range of motion and neck supple.  Cardiovascular:     Rate and Rhythm: Regular rhythm. Tachycardia present.     Heart sounds: S1 normal and S2 normal. No murmur. No friction rub. No gallop.   Pulmonary:     Effort: Pulmonary effort is normal. No respiratory distress.     Breath sounds: Normal breath sounds.  Chest:     Chest wall: No tenderness.  Abdominal:     General: Bowel sounds are normal. There is  distension.     Tenderness: There is no abdominal tenderness. There is no guarding or rebound. Negative signs include Murphy's sign and McBurney's sign.     Hernia: No hernia is present.  Musculoskeletal: Normal range of motion.   Skin:    General: Skin is warm and dry.     Findings: No rash.  Neurological:     Mental Status: She is alert and oriented to person, place, and time.     GCS: GCS eye subscore is 4. GCS verbal subscore is 5. GCS motor subscore is 6.     Cranial Nerves: No cranial nerve deficit.     Sensory: No sensory deficit.     Coordination: Coordination normal.  Psychiatric:        Speech: Speech normal.        Behavior: Behavior normal.        Thought Content: Thought content normal.      ED Treatments / Results  Labs (all labs ordered are listed, but only abnormal results are displayed) Labs Reviewed  CBC WITH DIFFERENTIAL/PLATELET - Abnormal; Notable for the following components:      Result Value   WBC 2.8 (*)    Hemoglobin 11.3 (*)    MCH 25.6 (*)    RDW 18.7 (*)    Neutro Abs 1.2 (*)    All other components within normal limits  COMPREHENSIVE METABOLIC PANEL - Abnormal; Notable for the following components:   Potassium 3.0 (*)    Calcium 8.4 (*)    Total Protein 6.3 (*)    Albumin 2.7 (*)    AST 105 (*)    Alkaline Phosphatase 293 (*)    Total Bilirubin 1.6 (*)    All other components within normal limits  URINALYSIS, ROUTINE W REFLEX MICROSCOPIC - Abnormal; Notable for the following components:   Color, Urine AMBER (*)    Ketones, ur 20 (*)    All other components within normal limits  LIPASE, BLOOD  TROPONIN I (HIGH SENSITIVITY)  TROPONIN I (HIGH SENSITIVITY)    EKG None  Radiology Ct Angio Chest Pe W Or Wo Contrast  Result Date: 04/10/2019 CLINICAL DATA:  Diffuse chest pain for the past 2 weeks, worse this morning. Undergoing chemotherapy. Metastatic breast cancer. Abdominal distension. EXAM: CT ANGIOGRAPHY CHEST CT ABDOMEN AND PELVIS WITH CONTRAST TECHNIQUE: Multidetector CT imaging of the chest was performed using the standard protocol during bolus administration of intravenous contrast. Multiplanar CT image reconstructions and MIPs were obtained to evaluate the  vascular anatomy. Multidetector CT imaging of the abdomen and pelvis was performed using the standard protocol during bolus administration of intravenous contrast. CONTRAST:  139mL OMNIPAQUE IOHEXOL 350 MG/ML SOLN COMPARISON:  Portable chest obtained earlier today. Abdomen pelvis radiograph dated 03/16/2019. Abdomen and pelvis CT dated 01/28/2019. Chest CTA dated 12/13/2018. FINDINGS: CTA CHEST FINDINGS Cardiovascular: Satisfactory opacification of the pulmonary arteries to the segmental level. No evidence of pulmonary embolism. Normal heart size. No pericardial effusion. Mediastinum/Nodes: No enlarged mediastinal, hilar, or axillary lymph nodes. Thyroid gland, trachea, and esophagus demonstrate no significant findings. Lungs/Pleura: Moderate-sized bilateral pleural effusions, greater on the left. These have both increased in amount. Bilateral lower lobe compressive atelectasis, greater on the right. Increased prominence of the interstitial markings in both lungs. Musculoskeletal: Mild thoracic spine degenerative changes. No evidence of bony metastatic disease. Review of the MIP images confirms the above findings. CT ABDOMEN and PELVIS FINDINGS Hepatobiliary: Multiple low density liver masses are again demonstrated. The largest is in the anterior  right lobe, measuring 9.1 x 6.4 cm on image number 29 series 4. On 01/28/2019, this measured 11.2 x 6.9 cm in corresponding dimensions. There are multiple new masses more superiorly. The largest measures 6.9 x 5.6 cm on image number 19 series 4. Normal sized gallbladder with mild diffuse wall thickening and enhancement, similar to the previous examination. A common duct stent remains in place. Interval mild intrahepatic biliary ductal dilatation in the lateral segment left lobe. Pancreas: Moderate diffuse pancreatic atrophy. Spleen: Normal in size without focal abnormality. Adrenals/Urinary Tract: Adrenal glands are unremarkable. Kidneys are normal, without renal calculi,  focal lesion, or hydronephrosis. Bladder is unremarkable. Stomach/Bowel: Stomach is within normal limits. Appendix appears normal. No evidence of bowel wall thickening, distention, or inflammatory changes. Vascular/Lymphatic: No significant vascular findings are present. No enlarged abdominal or pelvic lymph nodes. Reproductive: Status post hysterectomy. No adnexal masses. Other: Moderate to large amount of ascites, significantly increased. Musculoskeletal: Mild lumbar spine degenerative changes. No evidence of bony metastatic disease. Review of the MIP images confirms the above findings. IMPRESSION: 1. No pulmonary emboli. 2. Interval increase in moderate-sized bilateral pleural effusions, greater on the left. 3. Bilateral lower lobe compressive atelectasis, greater on the right. 4. Increased prominence of the interstitial markings in both lungs, compatible with interstitial pulmonary edema. 5. Progressive patent metastatic disease. 6. Interval mild intrahepatic biliary ductal dilatation in the lateral segment left lobe of the liver compatible with partial obstruction by the metastases. 7. Moderate to large amount of ascites, significantly increased. Electronically Signed   By: Claudie Revering M.D.   On: 04/10/2019 06:39   Ct Abdomen Pelvis W Contrast  Result Date: 04/10/2019 CLINICAL DATA:  Diffuse chest pain for the past 2 weeks, worse this morning. Undergoing chemotherapy. Metastatic breast cancer. Abdominal distension. EXAM: CT ANGIOGRAPHY CHEST CT ABDOMEN AND PELVIS WITH CONTRAST TECHNIQUE: Multidetector CT imaging of the chest was performed using the standard protocol during bolus administration of intravenous contrast. Multiplanar CT image reconstructions and MIPs were obtained to evaluate the vascular anatomy. Multidetector CT imaging of the abdomen and pelvis was performed using the standard protocol during bolus administration of intravenous contrast. CONTRAST:  165mL OMNIPAQUE IOHEXOL 350 MG/ML SOLN  COMPARISON:  Portable chest obtained earlier today. Abdomen pelvis radiograph dated 03/16/2019. Abdomen and pelvis CT dated 01/28/2019. Chest CTA dated 12/13/2018. FINDINGS: CTA CHEST FINDINGS Cardiovascular: Satisfactory opacification of the pulmonary arteries to the segmental level. No evidence of pulmonary embolism. Normal heart size. No pericardial effusion. Mediastinum/Nodes: No enlarged mediastinal, hilar, or axillary lymph nodes. Thyroid gland, trachea, and esophagus demonstrate no significant findings. Lungs/Pleura: Moderate-sized bilateral pleural effusions, greater on the left. These have both increased in amount. Bilateral lower lobe compressive atelectasis, greater on the right. Increased prominence of the interstitial markings in both lungs. Musculoskeletal: Mild thoracic spine degenerative changes. No evidence of bony metastatic disease. Review of the MIP images confirms the above findings. CT ABDOMEN and PELVIS FINDINGS Hepatobiliary: Multiple low density liver masses are again demonstrated. The largest is in the anterior right lobe, measuring 9.1 x 6.4 cm on image number 29 series 4. On 01/28/2019, this measured 11.2 x 6.9 cm in corresponding dimensions. There are multiple new masses more superiorly. The largest measures 6.9 x 5.6 cm on image number 19 series 4. Normal sized gallbladder with mild diffuse wall thickening and enhancement, similar to the previous examination. A common duct stent remains in place. Interval mild intrahepatic biliary ductal dilatation in the lateral segment left lobe. Pancreas: Moderate diffuse pancreatic atrophy. Spleen:  Normal in size without focal abnormality. Adrenals/Urinary Tract: Adrenal glands are unremarkable. Kidneys are normal, without renal calculi, focal lesion, or hydronephrosis. Bladder is unremarkable. Stomach/Bowel: Stomach is within normal limits. Appendix appears normal. No evidence of bowel wall thickening, distention, or inflammatory changes.  Vascular/Lymphatic: No significant vascular findings are present. No enlarged abdominal or pelvic lymph nodes. Reproductive: Status post hysterectomy. No adnexal masses. Other: Moderate to large amount of ascites, significantly increased. Musculoskeletal: Mild lumbar spine degenerative changes. No evidence of bony metastatic disease. Review of the MIP images confirms the above findings. IMPRESSION: 1. No pulmonary emboli. 2. Interval increase in moderate-sized bilateral pleural effusions, greater on the left. 3. Bilateral lower lobe compressive atelectasis, greater on the right. 4. Increased prominence of the interstitial markings in both lungs, compatible with interstitial pulmonary edema. 5. Progressive patent metastatic disease. 6. Interval mild intrahepatic biliary ductal dilatation in the lateral segment left lobe of the liver compatible with partial obstruction by the metastases. 7. Moderate to large amount of ascites, significantly increased. Electronically Signed   By: Claudie Revering M.D.   On: 04/10/2019 06:39   Dg Chest Port 1 View  Result Date: 04/10/2019 CLINICAL DATA:  Shortness of breath. Diffuse chest pain. EXAM: PORTABLE CHEST 1 VIEW COMPARISON:  01/28/2019 FINDINGS: Poor inspiration. Normal sized heart. Bibasilar atelectasis. Small left pleural effusion. Stable left subclavian porta catheter. Unremarkable bones. IMPRESSION: 1. Stable small left pleural effusion. 2. Poor inspiration with bibasilar atelectasis. Electronically Signed   By: Claudie Revering M.D.   On: 04/10/2019 05:49    Procedures Procedures (including critical care time)  Medications Ordered in ED Medications  sodium chloride 0.9 % bolus 1,000 mL (has no administration in time range)  fentaNYL (SUBLIMAZE) injection 50 mcg (has no administration in time range)  hydrOXYzine (VISTARIL) injection 25 mg (has no administration in time range)  ondansetron (ZOFRAN) injection 4 mg (4 mg Intravenous Given 04/10/19 0439)   metoCLOPramide (REGLAN) injection 10 mg (10 mg Intravenous Given 04/10/19 0438)  LORazepam (ATIVAN) injection 0.5 mg (0.5 mg Intravenous Given 04/10/19 0438)  sodium chloride 0.9 % bolus 1,000 mL (1,000 mLs Intravenous New Bag/Given 04/10/19 0439)  iohexol (OMNIPAQUE) 350 MG/ML injection 100 mL (100 mLs Intravenous Contrast Given 04/10/19 0611)     Initial Impression / Assessment and Plan / ED Course  I have reviewed the triage vital signs and the nursing notes.  Pertinent labs & imaging results that were available during my care of the patient were reviewed by me and considered in my medical decision making (see chart for details).        Patient presents to the emergency department for evaluation of nausea, vomiting and chest pain.  She has a history of metastatic breast cancer, currently being treated.  For the last 2 weeks or so she has had nausea and vomiting with pains in the chest.  She has had shortness of breath as well.  Based on her cancer history, she was felt to be at risk for PE.  Her initial chest x-ray did show low lung volumes, possible effusions.  Lab work revealed mild leukopenia but no neutropenia.  She is not febrile.  Remainder of her lab work was at her normal baseline.  Patient underwent CT angiography of chest to rule out PE.  There is no PE seen.  She has progression of her cancer.  There is bilateral pleural effusion present.  Patient also had CT of abdomen to rule out obstruction and worsening cancer burden.  She did  have some abdominal distention, this was explained by the ascites seen on the CT.  Abdominal exam is benign, I do not feel she has any likelihood of SBP.  There are no inflammatory changes or other signs of acute surgical intra-abdominal process.  Patient has had significant improvement of her nausea and is now tolerating oral intake.  At this point she has had approximately 750 mL of IV fluids.  She still has some mild tachycardia, likely multifactorial  including dehydration as well as pain.  Will administer pain medication and an additional liter of fluid.  I do not know if the patient will require hospitalization at this time.  If her vital signs improved and she is tolerating oral intake, it is reasonable to have her follow-up with her oncologist as an outpatient.  Will sign to oncoming ER physician to follow her progression with further treatment.  Final Clinical Impressions(s) / ED Diagnoses   Final diagnoses:  Non-intractable vomiting with nausea, unspecified vomiting type  Metastatic breast cancer [facility])    ED Discharge Orders    None       Orpah Greek, MD 04/10/19 [phone]

## 2019-04-10 NOTE — ED Triage Notes (Signed)
Pt reports her "whole chest hurts" x 2 weeks worse this am. Pt receives chemo here at [facility].

## 2019-04-11 DIAGNOSIS — K219 Gastro-esophageal reflux disease without esophagitis: Secondary | ICD-10-CM | POA: Diagnosis not present

## 2019-04-11 DIAGNOSIS — F329 Major depressive disorder, single episode, unspecified: Secondary | ICD-10-CM | POA: Diagnosis not present

## 2019-04-11 DIAGNOSIS — R112 Nausea with vomiting, unspecified: Secondary | ICD-10-CM | POA: Diagnosis not present

## 2019-04-11 DIAGNOSIS — E860 Dehydration: Secondary | ICD-10-CM | POA: Diagnosis not present

## 2019-04-11 LAB — CBC
HCT: 32 % — ABNORMAL LOW (ref 36.0–46.0)
Hemoglobin: 10.1 g/dL — ABNORMAL LOW (ref 12.0–15.0)
MCH: 26.4 pg (ref 26.0–34.0)
MCHC: 31.6 g/dL (ref 30.0–36.0)
MCV: 83.6 fL (ref 80.0–100.0)
Platelets: 177 10*3/uL (ref 150–400)
RBC: 3.83 MIL/uL — ABNORMAL LOW (ref 3.87–5.11)
RDW: 19.2 % — ABNORMAL HIGH (ref 11.5–15.5)
WBC: 2.6 10*3/uL — ABNORMAL LOW (ref 4.0–10.5)
nRBC: 0 % (ref 0.0–0.2)

## 2019-04-11 LAB — BASIC METABOLIC PANEL
Anion gap: 8 (ref 5–15)
BUN: 6 mg/dL (ref 6–20)
CO2: 22 mmol/L (ref 22–32)
Calcium: 8.1 mg/dL — ABNORMAL LOW (ref 8.9–10.3)
Chloride: 108 mmol/L (ref 98–111)
Creatinine, Ser: 0.46 mg/dL (ref 0.44–1.00)
GFR calc Af Amer: >60 mL/min (ref >60)
GFR calc non Af Amer: >60 mL/min (ref >60)
Glucose, Bld: 72 mg/dL (ref 70–99)
Potassium: 3.7 mmol/L (ref 3.5–5.1)
Sodium: 138 mmol/L (ref 135–145)

## 2019-04-11 MED ORDER — ENSURE ENLIVE PO LIQD
237.0000 mL | Freq: Two times a day (BID) | ORAL | Status: DC
  Administered 2019-04-11: 237 mL via ORAL

## 2019-04-11 MED ORDER — HEPARIN SOD (PORK) LOCK FLUSH 100 UNIT/ML IV SOLN
500.0000 [IU] | Freq: Once | INTRAVENOUS | Status: AC
  Administered 2019-04-11: 500 [IU] via INTRAVENOUS
  Filled 2019-04-11: qty 5

## 2019-04-11 MED ORDER — PROMETHAZINE HCL 25 MG PO TABS: 25.0000 mg | ORAL_TABLET | Freq: Three times a day (TID) | ORAL | 0 refills | Status: DC | PRN

## 2019-04-11 MED ORDER — METOPROLOL TARTRATE 25 MG PO TABS: 12.5000 mg | ORAL_TABLET | Freq: Two times a day (BID) | ORAL | 1 refills | Status: DC

## 2019-04-11 MED ORDER — CHLORHEXIDINE GLUCONATE CLOTH 2 % EX PADS: 6.0000 | MEDICATED_PAD | Freq: Every day | CUTANEOUS | Status: DC

## 2019-04-11 NOTE — Progress Notes (Signed)
Paged Dr. Dyann Kief and there was a mixup about discharge. Discharge orders placed, patient given paperwork and IV access discontinued. Patient is going home and is driving herself.

## 2019-04-11 NOTE — Discharge Summary (Signed)
Physician Discharge Summary  Pamela Long [id number] DOB: 1971-06-01 DOA: 04/10/2019  PCP: Doree Albee, MD  Admit date: 04/10/2019 Discharge date: 04/11/2019  Time spent: 35 minutes  Recommendations for Outpatient Follow-up:  Repeat basic metabolic panel to follow electrolytes and renal function Reassess blood pressure and further adjust antihypertensive regimen as needed  Discharge Diagnoses:  Principal Problem:   Intractable nausea and vomiting Active Problems:   Depression   GERD (gastroesophageal reflux disease)   Metastatic breast cancer (HCC)   Severe protein-calorie malnutrition ([place])   Dehydration   RUQ abdominal pain   Discharge Condition: Stable and improved.  Discharged home with instruction to follow-up with PCP and oncology service as an outpatient.  Diet recommendation: Low-sodium/heart healthy diet.  Filed Weights   04/10/19 0359 04/11/19 0600  Weight: 73.9 kg 80.1 kg    History of present illness:  48 y.o. female with PMH significant for metastatic breast cancer, anxiety/depression, GERD, HTN and asthma; who presented to ED with complaints of generalized weakness and intractable nausea and vomiting. Patient symptoms have present for the last 4 days or so and worsening. Unable to keep anything down currently. Patient expressed pleuritic chest discomfort, mid chest, worse with vomiting and movement, radiated to her back and without any other associated or aggravated factors. No hematemesis, no fever, no dysuria, no hematuria, no focal weakness, no headaches, no blurred vision, no SOB and no recent contacts with positive COVID patients.  In the ED patient found tachycardic, hypokalemic and actively vomiting during evaluation. CT chest and abdomen demonstrated extensive metastatic disease and small unchanged pleural effusion; no bowel obstruction or other acute abnormality appreciated. Despite IVF's and antiemetics, she continue to be tachycardic and having  nausea. TRH contacted to place in the hospital for further evaluation and management.   Hospital Course:  1-Intractable nausea and vomiting -Multifactorial, slightly associated with chemotherapy treatment, ongoing metastatic malignancy and use of narcotics medications. -Patient was aggressively fluid resuscitated and symptoms improved with the use of antiemetics -Patient having discharge on Compazine and Phenergan -Advised to keep yourself well-hydrated and to follow-up with oncology service and PCP as an outpatient.  2-dehydration/hypokalemia -Resolved after fluid resuscitation and electrolyte repletion -Repeat basic metabolic panel follow-up visit to reassess trend.  3-depression/anxiety  -Overall stable; despite flat affect. -No suicidal ideation or hallucinations -Continue home antidepressant and anxiolytics management.  4-gastroesophageal disease -Continue PPI.  5-metastatic breast cancer -Continue outpatient follow-up with oncology service -Continue as needed analgesics.  6-chest discomfort/tachycardia -Pleuritic in nature -No acute ischemic changes on EKG or telemetry -TSH within normal limits -Significantly improved with fluid resuscitation -CTA negative for PE -Continue the use of PPI.  7-hypertension -Stable and rising -Patient has been discharged on metoprolol. -Reassess blood pressure at follow-up visit and further adjust antihypertensive regimen as required.  Procedures:  See below for x-ray reports.  Consultations:  None  Discharge Exam: Vitals:   04/11/19 1000 04/11/19 1134  BP:    Pulse:    Resp: (!) 23   Temp:  98.2 F (36.8 C)  SpO2:      General: Afebrile, no chest pain, no further vomiting episodes, stable abdominal discomfort.  Patient is still experiencing intermittent nausea and decreased appetite. Cardiovascular: Sinus rhythm; S1 and S2, no rubs, no gallops, no JVD. Respiratory: Clear to auscultation bilaterally Abdomen: Soft, no  guarding; diffuse vague tenderness on deep palpation, positive bowel sounds. Extremities: No cyanosis or clubbing.  Discharge Instructions    Allergies as of 04/11/2019  Reactions   Salagen [pilocarpine] Other (See Comments)   Can cause liver failure    Tape Itching, Other (See Comments)   Depending on the adhesive-blistering occurs   Amoxicillin Hives, Other (See Comments)   Rash only DID THE REACTION INVOLVE: Swelling of the face/tongue/throat, SOB, or low BP? Sudden or severe rash/hives, skin peeling, or the inside of the mouth or nose?  Did it require medical treatment?  When did it last happen? If all above answers are "NO", may proceed with cephalosporin use.   Caffeine Diarrhea, Nausea Only, Palpitations, Other (See Comments)   Headache   Tetanus Toxoids Swelling, Other (See Comments)   Local reaction      Medication List    STOP taking these medications   furosemide 20 MG tablet Commonly known as: LASIX   magic mouthwash w/lidocaine Soln   spironolactone 25 MG tablet Commonly known as: ALDACTONE   traMADol 50 MG tablet Commonly known as: ULTRAM     TAKE these medications   escitalopram 20 MG tablet Commonly known as: LEXAPRO Take 1 tablet (20 mg total) by mouth daily.   gabapentin 300 MG capsule Commonly known as: NEURONTIN Take 1 capsule (300 mg total) by mouth at bedtime.   lubiprostone 8 MCG capsule Commonly known as: Amitiza Take 1 capsule (8 mcg total) by mouth 2 (two) times daily with a meal.   magnesium oxide 400 MG tablet Commonly known as: MAG-OX TAKE 1 TABLET BY MOUTH EVERY DAY   metoprolol tartrate 25 MG tablet Commonly known as: LOPRESSOR Take 0.5 tablets (12.5 mg total) by mouth 2 (two) times daily.   nystatin-triamcinolone cream Commonly known as: MYCOLOG II Apply 1 application topically 2 (two) times daily.   oxyCODONE-acetaminophen 5-325 MG tablet Commonly known as: PERCOCET/ROXICET Take 1 tablet by mouth every 4  (four) hours as needed for severe pain.   pantoprazole 40 MG tablet Commonly known as: PROTONIX TAKE 1 TABLET BY MOUTH EVERY DAY BEFORE BREAKFAST   potassium chloride 10 MEQ tablet Commonly known as: KLOR-CON Take 10 mEq by mouth 2 (two) times daily.   prochlorperazine 10 MG tablet Commonly known as: COMPAZINE Take 1 tablet (10 mg total) by mouth every 6 (six) hours as needed for nausea or vomiting.   promethazine 25 MG tablet Commonly known as: PHENERGAN Take 1 tablet (25 mg total) by mouth every 8 (eight) hours as needed for refractory nausea / vomiting.   scopolamine 1 MG/3DAYS Commonly known as: TRANSDERM-SCOP Place 1 patch (1.5 mg total) onto the skin every 3 (three) days.   TAXOL IV Inject into the vein every 14 (fourteen) days.   temazepam 15 MG capsule Commonly known as: RESTORIL TAKE 1 CAPSULE (15 MG TOTAL) BY MOUTH AT BEDTIME AS NEEDED FOR SLEEP. What changed: when to take this      Allergies  Allergen Reactions  . Salagen [Pilocarpine] Other (See Comments)    Can cause liver failure   . Tape Itching and Other (See Comments)    Depending on the adhesive-blistering occurs  . Amoxicillin Hives and Other (See Comments)    Rash only DID THE REACTION INVOLVE: Swelling of the face/tongue/throat, SOB, or low BP? Sudden or severe rash/hives, skin peeling, or the inside of the mouth or nose?  Did it require medical treatment?  When did it last happen? If all above answers are "NO", may proceed with cephalosporin use.   . Caffeine Diarrhea, Nausea Only, Palpitations and Other (See Comments)    Headache  . Tetanus Toxoids  Swelling and Other (See Comments)    Local reaction   Follow-up Information    Gosrani, Nimish C, MD. Schedule an appointment as soon as possible for a visit in 10 day(s).   Specialty: Internal Medicine Contact information: [place] [address] [phone]        Satira Sark, MD .   Specialty:  Cardiology Contact information: [address] [place] [address] [phone]           The results of significant diagnostics from this hospitalization (including imaging, microbiology, ancillary and laboratory) are listed below for reference.    Significant Diagnostic Studies: Dg Abd 1 View  Result Date: 03/16/2019 CLINICAL DATA:  Biliary stent placement today. History of breast cancer. EXAM: ABDOMEN - 1 VIEW COMPARISON:  01/27/2019 FINDINGS: New biliary stent in adequate position over the mid to right upper abdomen. Bowel gas pattern is nonobstructive. Remainder of the exam is unchanged. IMPRESSION: Nonobstructive bowel gas pattern. Biliary stent over the right mid to upper abdomen. Electronically Signed   By: Marin Olp M.D.   On: 03/16/2019 16:11   Ct Angio Chest Pe W Or Wo Contrast  Result Date: 04/10/2019 CLINICAL DATA:  Diffuse chest pain for the past 2 weeks, worse this morning. Undergoing chemotherapy. Metastatic breast cancer. Abdominal distension. EXAM: CT ANGIOGRAPHY CHEST CT ABDOMEN AND PELVIS WITH CONTRAST TECHNIQUE: Multidetector CT imaging of the chest was performed using the standard protocol during bolus administration of intravenous contrast. Multiplanar CT image reconstructions and MIPs were obtained to evaluate the vascular anatomy. Multidetector CT imaging of the abdomen and pelvis was performed using the standard protocol during bolus administration of intravenous contrast. CONTRAST:  163mL OMNIPAQUE IOHEXOL 350 MG/ML SOLN COMPARISON:  Portable chest obtained earlier today. Abdomen pelvis radiograph dated 03/16/2019. Abdomen and pelvis CT dated 01/28/2019. Chest CTA dated 12/13/2018. FINDINGS: CTA CHEST FINDINGS Cardiovascular: Satisfactory opacification of the pulmonary arteries to the segmental level. No evidence of pulmonary embolism. Normal heart size. No pericardial effusion. Mediastinum/Nodes: No enlarged mediastinal, hilar, or axillary lymph nodes. Thyroid  gland, trachea, and esophagus demonstrate no significant findings. Lungs/Pleura: Moderate-sized bilateral pleural effusions, greater on the left. These have both increased in amount. Bilateral lower lobe compressive atelectasis, greater on the right. Increased prominence of the interstitial markings in both lungs. Musculoskeletal: Mild thoracic spine degenerative changes. No evidence of bony metastatic disease. Review of the MIP images confirms the above findings. CT ABDOMEN and PELVIS FINDINGS Hepatobiliary: Multiple low density liver masses are again demonstrated. The largest is in the anterior right lobe, measuring 9.1 x 6.4 cm on image number 29 series 4. On 01/28/2019, this measured 11.2 x 6.9 cm in corresponding dimensions. There are multiple new masses more superiorly. The largest measures 6.9 x 5.6 cm on image number 19 series 4. Normal sized gallbladder with mild diffuse wall thickening and enhancement, similar to the previous examination. A common duct stent remains in place. Interval mild intrahepatic biliary ductal dilatation in the lateral segment left lobe. Pancreas: Moderate diffuse pancreatic atrophy. Spleen: Normal in size without focal abnormality. Adrenals/Urinary Tract: Adrenal glands are unremarkable. Kidneys are normal, without renal calculi, focal lesion, or hydronephrosis. Bladder is unremarkable. Stomach/Bowel: Stomach is within normal limits. Appendix appears normal. No evidence of bowel wall thickening, distention, or inflammatory changes. Vascular/Lymphatic: No significant vascular findings are present. No enlarged abdominal or pelvic lymph nodes. Reproductive: Status post hysterectomy. No adnexal masses. Other: Moderate to large amount of ascites, significantly increased. Musculoskeletal: Mild lumbar spine degenerative  changes. No evidence of bony metastatic disease. Review of the MIP images confirms the above findings. IMPRESSION: 1. No pulmonary emboli. 2. Interval increase in  moderate-sized bilateral pleural effusions, greater on the left. 3. Bilateral lower lobe compressive atelectasis, greater on the right. 4. Increased prominence of the interstitial markings in both lungs, compatible with interstitial pulmonary edema. 5. Progressive patent metastatic disease. 6. Interval mild intrahepatic biliary ductal dilatation in the lateral segment left lobe of the liver compatible with partial obstruction by the metastases. 7. Moderate to large amount of ascites, significantly increased. Electronically Signed   By: Claudie Revering M.D.   On: 04/10/2019 06:39   Ct Abdomen Pelvis W Contrast  Result Date: 04/10/2019 CLINICAL DATA:  Diffuse chest pain for the past 2 weeks, worse this morning. Undergoing chemotherapy. Metastatic breast cancer. Abdominal distension. EXAM: CT ANGIOGRAPHY CHEST CT ABDOMEN AND PELVIS WITH CONTRAST TECHNIQUE: Multidetector CT imaging of the chest was performed using the standard protocol during bolus administration of intravenous contrast. Multiplanar CT image reconstructions and MIPs were obtained to evaluate the vascular anatomy. Multidetector CT imaging of the abdomen and pelvis was performed using the standard protocol during bolus administration of intravenous contrast. CONTRAST:  123mL OMNIPAQUE IOHEXOL 350 MG/ML SOLN COMPARISON:  Portable chest obtained earlier today. Abdomen pelvis radiograph dated 03/16/2019. Abdomen and pelvis CT dated 01/28/2019. Chest CTA dated 12/13/2018. FINDINGS: CTA CHEST FINDINGS Cardiovascular: Satisfactory opacification of the pulmonary arteries to the segmental level. No evidence of pulmonary embolism. Normal heart size. No pericardial effusion. Mediastinum/Nodes: No enlarged mediastinal, hilar, or axillary lymph nodes. Thyroid gland, trachea, and esophagus demonstrate no significant findings. Lungs/Pleura: Moderate-sized bilateral pleural effusions, greater on the left. These have both increased in amount. Bilateral lower lobe  compressive atelectasis, greater on the right. Increased prominence of the interstitial markings in both lungs. Musculoskeletal: Mild thoracic spine degenerative changes. No evidence of bony metastatic disease. Review of the MIP images confirms the above findings. CT ABDOMEN and PELVIS FINDINGS Hepatobiliary: Multiple low density liver masses are again demonstrated. The largest is in the anterior right lobe, measuring 9.1 x 6.4 cm on image number 29 series 4. On 01/28/2019, this measured 11.2 x 6.9 cm in corresponding dimensions. There are multiple new masses more superiorly. The largest measures 6.9 x 5.6 cm on image number 19 series 4. Normal sized gallbladder with mild diffuse wall thickening and enhancement, similar to the previous examination. A common duct stent remains in place. Interval mild intrahepatic biliary ductal dilatation in the lateral segment left lobe. Pancreas: Moderate diffuse pancreatic atrophy. Spleen: Normal in size without focal abnormality. Adrenals/Urinary Tract: Adrenal glands are unremarkable. Kidneys are normal, without renal calculi, focal lesion, or hydronephrosis. Bladder is unremarkable. Stomach/Bowel: Stomach is within normal limits. Appendix appears normal. No evidence of bowel wall thickening, distention, or inflammatory changes. Vascular/Lymphatic: No significant vascular findings are present. No enlarged abdominal or pelvic lymph nodes. Reproductive: Status post hysterectomy. No adnexal masses. Other: Moderate to large amount of ascites, significantly increased. Musculoskeletal: Mild lumbar spine degenerative changes. No evidence of bony metastatic disease. Review of the MIP images confirms the above findings. IMPRESSION: 1. No pulmonary emboli. 2. Interval increase in moderate-sized bilateral pleural effusions, greater on the left. 3. Bilateral lower lobe compressive atelectasis, greater on the right. 4. Increased prominence of the interstitial markings in both lungs,  compatible with interstitial pulmonary edema. 5. Progressive patent metastatic disease. 6. Interval mild intrahepatic biliary ductal dilatation in the lateral segment left lobe of the liver compatible with partial obstruction  by the metastases. 7. Moderate to large amount of ascites, significantly increased. Electronically Signed   By: Claudie Revering M.D.   On: 04/10/2019 06:39   Dg Chest Port 1 View  Result Date: 04/10/2019 CLINICAL DATA:  Shortness of breath. Diffuse chest pain. EXAM: PORTABLE CHEST 1 VIEW COMPARISON:  01/28/2019 FINDINGS: Poor inspiration. Normal sized heart. Bibasilar atelectasis. Small left pleural effusion. Stable left subclavian porta catheter. Unremarkable bones. IMPRESSION: 1. Stable small left pleural effusion. 2. Poor inspiration with bibasilar atelectasis. Electronically Signed   By: Claudie Revering M.D.   On: 04/10/2019 05:49   Dg Ercp  Result Date: 03/16/2019 CLINICAL DATA:  ERCP.  Bile duct stricture/stent EXAM: ERCP TECHNIQUE: Multiple spot images obtained with the fluoroscopic device and submitted for interpretation post-procedure. FLUOROSCOPY TIME:  Fluoroscopy Time:  2 minutes and 55 seconds Number of Acquired Spot Images: 6 COMPARISON:  April fourteenth 2020 FINDINGS: Initial images demonstrate a plastic biliary stent. There is subsequent cannulation of the common bile duct with the junction of contrast. Final images demonstrate deployment of a metallic biliary stent. IMPRESSION: ERCP with stent exchange as detailed above. These images were submitted for radiologic interpretation only. Please see the procedural report for the amount of contrast and the fluoroscopy time utilized. Electronically Signed   By: Constance Holster M.D.   On: 03/16/2019 12:18    Microbiology: Recent Results (from the past 240 hour(s))  SARS Coronavirus 2 by RT PCR (hospital order, performed in [facility] hospital lab) Nasopharyngeal Nasopharyngeal Swab     Status: None   Collection Time:  04/10/19 11:21 AM   Specimen: Nasopharyngeal Swab  Result Value Ref Range Status   SARS Coronavirus 2 NEGATIVE NEGATIVE Final    Comment: (NOTE) If result is NEGATIVE SARS-CoV-2 target nucleic acids are NOT DETECTED. The SARS-CoV-2 RNA is generally detectable in upper and lower  respiratory specimens during the acute phase of infection. The lowest  concentration of SARS-CoV-2 viral copies this assay can detect is 250  copies / mL. A negative result does not preclude SARS-CoV-2 infection  and should not be used as the sole basis for treatment or other  patient management decisions.  A negative result may occur with  improper specimen collection / handling, submission of specimen other  than nasopharyngeal swab, presence of viral mutation(s) within the  areas targeted by this assay, and inadequate number of viral copies  (<250 copies / mL). A negative result must be combined with clinical  observations, patient history, and epidemiological information. If result is POSITIVE SARS-CoV-2 target nucleic acids are DETECTED. The SARS-CoV-2 RNA is generally detectable in upper and lower  respiratory specimens dur ing the acute phase of infection.  Positive  results are indicative of active infection with SARS-CoV-2.  Clinical  correlation with patient history and other diagnostic information is  necessary to determine patient infection status.  Positive results do  not rule out bacterial infection or co-infection with other viruses. If result is PRESUMPTIVE POSTIVE SARS-CoV-2 nucleic acids MAY BE PRESENT.   A presumptive positive result was obtained on the submitted specimen  and confirmed on repeat testing.  While 2019 novel coronavirus  (SARS-CoV-2) nucleic acids may be present in the submitted sample  additional confirmatory testing may be necessary for epidemiological  and / or clinical management purposes  to differentiate between  SARS-CoV-2 and other Sarbecovirus currently known to  infect humans.  If clinically indicated additional testing with an alternate test  methodology [phone]) is advised. The SARS-CoV-2 RNA is  generally  detectable in upper and lower respiratory sp ecimens during the acute  phase of infection. The expected result is Negative. Fact Sheet for Patients:  StrictlyIdeas.no Fact Sheet for Healthcare Providers: BankingDealers.co.za This test is not yet approved or cleared by the [place] FDA and has been authorized for detection and/or diagnosis of SARS-CoV-2 by FDA under an Emergency Use Authorization (EUA).  This EUA will remain in effect (meaning this test can be used) for the duration of the COVID-19 declaration under Section 564(b)(1) of the Act, 21 U.S.C. section 360bbb-3(b)(1), unless the authorization is terminated or revoked sooner. Performed at [facility], [address], [place], [place] [address]      Labs: Basic Metabolic Panel: Recent Labs  Lab 04/05/19 1031 04/08/19 0818 04/10/19 0412 04/10/19 0630 04/10/19 1747 04/11/19 0545  NA 137 137 136  --   --  138  K 3.1* 3.0* 3.0*  --   --  3.7  CL 101 101 103  --   --  108  CO2 25 24 24   --   --  22  GLUCOSE 99 102* 95  --   --  72  BUN 8 8 7   --   --  6  CREATININE 0.56 0.58 0.52  --   --  0.46  CALCIUM 9.0 8.7* 8.4*  --   --  8.1*  MG 1.5*  --   --  1.8  --   --   PHOS  --   --   --   --  3.7  --    Liver Function Tests: Recent Labs  Lab 04/05/19 1031 04/08/19 0818 04/10/19 0412  AST 100* 109* 105*  ALT 40 41 36  ALKPHOS 270* 305* 293*  BILITOT 1.6* 1.9* 1.6*  PROT 6.6 6.4* 6.3*  ALBUMIN 2.9* 2.7* 2.7*   Recent Labs  Lab 04/10/19 0412  LIPASE 14   CBC: Recent Labs  Lab 04/05/19 1031 04/08/19 0818 04/10/19 0412 04/11/19 0545  WBC 2.9* 2.3* 2.8* 2.6*  NEUTROABS 1.9 1.1* 1.2*  --   HGB 11.2* 11.4* 11.3* 10.1*  HCT 35.5* 36.7 36.2 32.0*  MCV 82.4 82.5 81.9 83.6  PLT 220 212 225 177    BNP (last  3 results) Recent Labs    12/13/18 1115  BNP 97.0   Signed:  Barton Dubois MD.  [facility] 04/11/2019, 2:33 PM

## 2019-04-12 ENCOUNTER — Telehealth (HOSPITAL_COMMUNITY): Payer: Self-pay

## 2019-04-12 ENCOUNTER — Ambulatory Visit (HOSPITAL_COMMUNITY): Payer: 59 | Admitting: Physical Therapy

## 2019-04-12 ENCOUNTER — Other Ambulatory Visit (HOSPITAL_COMMUNITY): Payer: 59

## 2019-04-12 ENCOUNTER — Telehealth (HOSPITAL_COMMUNITY): Payer: Self-pay | Admitting: Physical Therapy

## 2019-04-12 ENCOUNTER — Ambulatory Visit (HOSPITAL_COMMUNITY): Payer: 59 | Admitting: Hematology

## 2019-04-12 NOTE — Telephone Encounter (Signed)
pt just got out of the hospital wants to cancel both appts for today and friday

## 2019-04-13 ENCOUNTER — Other Ambulatory Visit (HOSPITAL_COMMUNITY): Payer: Self-pay | Admitting: Hematology

## 2019-04-13 ENCOUNTER — Telehealth (HOSPITAL_COMMUNITY): Payer: Self-pay | Admitting: *Deleted

## 2019-04-13 DIAGNOSIS — C50919 Malignant neoplasm of unspecified site of unspecified female breast: Secondary | ICD-10-CM

## 2019-04-13 DIAGNOSIS — R188 Other ascites: Secondary | ICD-10-CM

## 2019-04-13 NOTE — Telephone Encounter (Signed)
Pt called into to clinic stating she was discharged from hospital on Sunday. Pt stated she having trouble breathing and needs fluid drawn off her. Pamela Long made aware an order was placed for a paracentesis. Scheduler called pt to let her know  appointment time.

## 2019-04-14 ENCOUNTER — Ambulatory Visit (HOSPITAL_COMMUNITY): Payer: 59

## 2019-04-15 ENCOUNTER — Inpatient Hospital Stay (HOSPITAL_COMMUNITY): Payer: 59 | Attending: Hematology

## 2019-04-15 ENCOUNTER — Other Ambulatory Visit: Payer: Self-pay

## 2019-04-15 ENCOUNTER — Telehealth (HOSPITAL_COMMUNITY): Payer: Self-pay | Admitting: *Deleted

## 2019-04-15 VITALS — BP 138/80 | HR 114 | Temp 97.8°F | Resp 18

## 2019-04-15 DIAGNOSIS — C50919 Malignant neoplasm of unspecified site of unspecified female breast: Secondary | ICD-10-CM | POA: Insufficient documentation

## 2019-04-15 DIAGNOSIS — R112 Nausea with vomiting, unspecified: Secondary | ICD-10-CM

## 2019-04-15 DIAGNOSIS — R110 Nausea: Secondary | ICD-10-CM | POA: Diagnosis not present

## 2019-04-15 DIAGNOSIS — E860 Dehydration: Secondary | ICD-10-CM | POA: Diagnosis not present

## 2019-04-15 MED ORDER — SODIUM CHLORIDE 0.9% FLUSH
10.0000 mL | INTRAVENOUS | Status: DC | PRN
  Administered 2019-04-15: 10 mL via INTRAVENOUS
  Filled 2019-04-15: qty 10

## 2019-04-15 MED ORDER — SODIUM CHLORIDE 0.9 % IV SOLN
8.0000 mg | Freq: Once | INTRAVENOUS | Status: AC
  Administered 2019-04-15: 8 mg via INTRAVENOUS
  Filled 2019-04-15: qty 4

## 2019-04-15 MED ORDER — PROMETHAZINE HCL 25 MG PO TABS: 25.0000 mg | ORAL_TABLET | Freq: Three times a day (TID) | ORAL | 0 refills | Status: DC | PRN

## 2019-04-15 MED ORDER — HEPARIN SOD (PORK) LOCK FLUSH 100 UNIT/ML IV SOLN
500.0000 [IU] | Freq: Once | INTRAVENOUS | Status: AC
  Administered 2019-04-15: 500 [IU] via INTRAVENOUS

## 2019-04-15 MED ORDER — SODIUM CHLORIDE 0.9 % IV SOLN
Freq: Once | INTRAVENOUS | Status: AC
  Administered 2019-04-15: 11:00:00 via INTRAVENOUS

## 2019-04-15 NOTE — Telephone Encounter (Signed)
Pt called into clinic stating her nausea and vomiting medications are not working. Reynolds Bowl, NP made aware and stated for the pt come in today for IV Zofran and IVF. Called pt back to let her know and pt verbalized understanding.

## 2019-04-15 NOTE — Progress Notes (Signed)
To treatment area for hydration and nausea medications.  No complaints voiced.  No s/s of distress noted.  No active vomiting at this time.    1145-Patient complains of abdominal pain after administration of Zofran.  Patient stated her abdomen  started hurting after this medication.  After further questioning the patient has problems with constipation and her last bowel movement was two days ago with little relief with the enema she used.  Patient described the bowel movement as firm. She has not been taking any stool softeners with her history of constipation due to nausea.  She has also not been taking her nausea medications due to her nausea. Abdomen palpated and felt soft/firm and complains of pain with touch. Also, stated the pain radiated to her back when palpated.  The pain also feels like it is pushing into her ribs per the patient.  Reviewed with the pharmacist and Harriet Pho, NP.  Instructed to tell the patient to use her lactulose at home and nausea medications.  No further instructions received.  No s/s of distress noted.   1300-Patient verbalized some relief with her abdominal pain.  She is scheduled for paracentesis tomorrow morning.  Instructed the patient to call us if she needs more hydration tomorrow for her nausea.  Reviewed with the patient how to use her nausea medications and lactulose and the importance of water with the lactulose.  Phenergan prescription refilled and sent to her pharmacy. Patient verbalized understanding.  No s/s of distress noted.   Patient tolerated hydration.  Port site clean and dry with good blood return noted before and after hydration.  No bruising or swelling noted with port.  Band aid applied.  VSS with discharge and left by wheelchair with no s/s of distress noted.

## 2019-04-16 ENCOUNTER — Ambulatory Visit (HOSPITAL_COMMUNITY): Payer: 59

## 2019-04-16 ENCOUNTER — Telehealth (HOSPITAL_COMMUNITY): Payer: Self-pay

## 2019-04-16 ENCOUNTER — Ambulatory Visit (HOSPITAL_COMMUNITY)
Admission: RE | Admit: 2019-04-16 | Discharge: 2019-04-16 | Disposition: A | Discharge status: Home / Self Care | Payer: 59 | Source: Ambulatory Visit | Attending: Hematology | Admitting: Hematology

## 2019-04-16 DIAGNOSIS — C50919 Malignant neoplasm of unspecified site of unspecified female breast: Secondary | ICD-10-CM | POA: Insufficient documentation

## 2019-04-16 IMAGING — US US PARACENTESIS
1 series · 3 of 3 positions shown · non-contrast
Comparison: none

INDICATION: Metastatic breast cancer, ascites

[Series 1: us paracentesis · 3 of 3 slices shown]
[im 1/3]
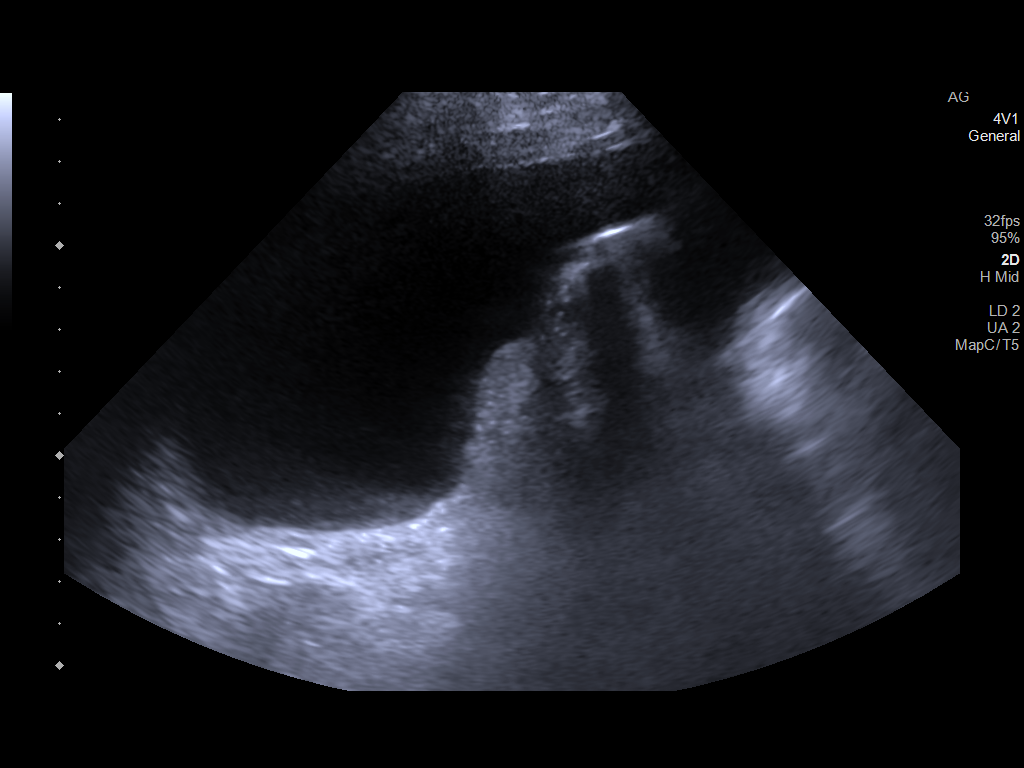
[im 2/3]
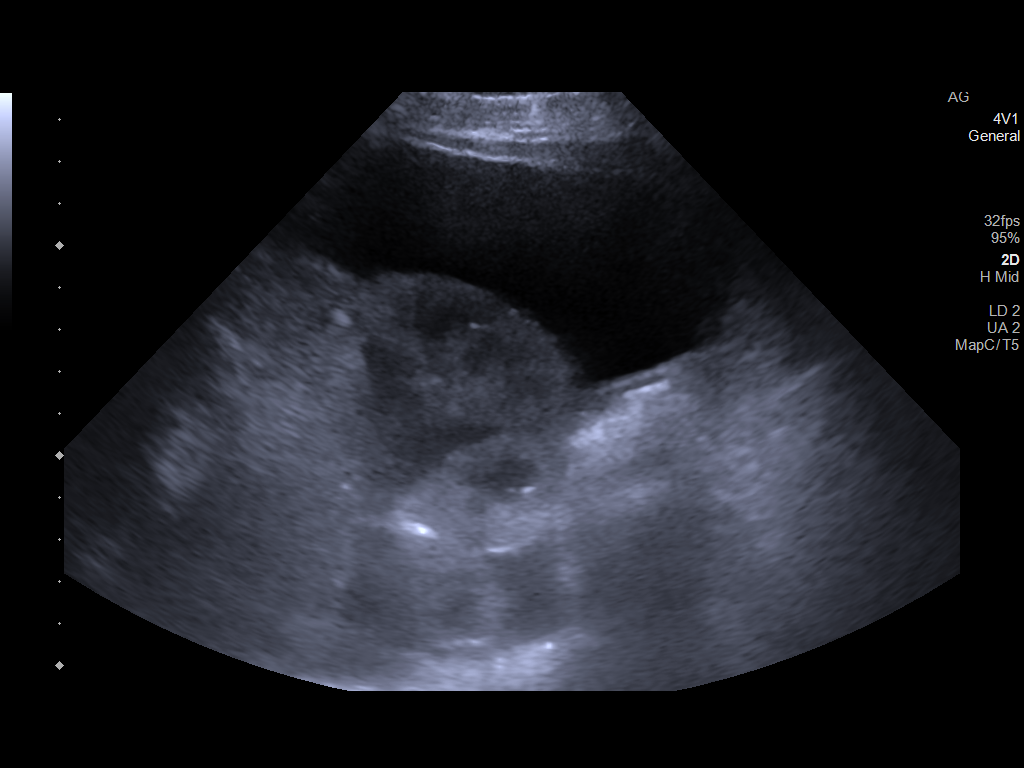
[im 3/3]
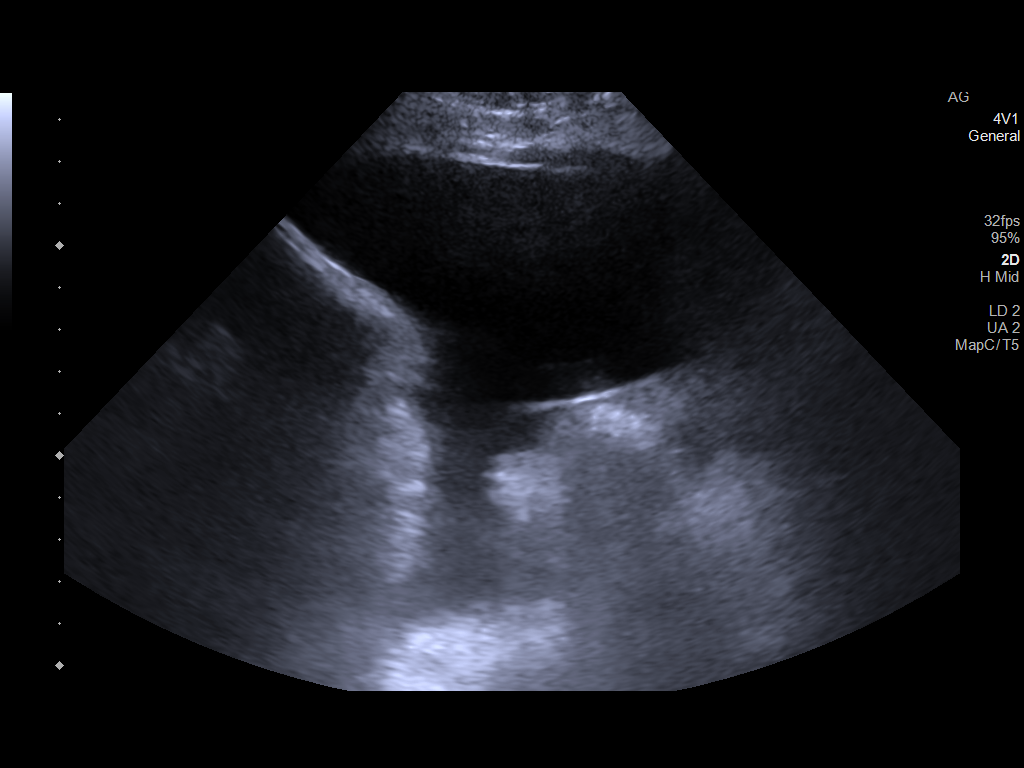

[3 of 3 positions shown; findings below may reference images not displayed]

EXAM:
ULTRASOUND GUIDED THERAPEUTIC PARACENTESIS

MEDICATIONS:
None

COMPLICATIONS:
None immediate

PROCEDURE:
Informed written consent was obtained from the patient after a
discussion of the risks, benefits and alternatives to treatment. A
timeout was performed prior to the initiation of the procedure.

Initial ultrasound scanning demonstrates a large amount of ascites
within the LEFT lower abdominal quadrant. The right lower abdomen
was prepped and draped in the usual sterile fashion. 1% lidocaine
was used for local anesthesia.

Following this, a 5 French Yueh catheter was introduced. An
ultrasound image was saved for documentation purposes. The
paracentesis was performed. The catheter was removed and a dressing
was applied. The patient tolerated the procedure well without
immediate post procedural complication.
FINDINGS: A total of approximately 4.9 L of yellow ascitic fluid was removed.
IMPRESSION: Successful ultrasound-guided paracentesis yielding 4.9 liters of
peritoneal fluid.

## 2019-04-16 NOTE — Procedures (Signed)
PreOperative Dx: Metastatic breast cancer, ascites Postoperative Dx: Metastatic breast cancer, ascites Procedure:   US guided paracentesis Radiologist:  Thornton Papas Anesthesia:  10 ml of1% lidocaine Specimen:  4.9 L of yellow ascitic fluid EBL:   < 1 ml Complications: None

## 2019-04-16 NOTE — Telephone Encounter (Signed)
cx appt per phone tree report

## 2019-04-17 ENCOUNTER — Emergency Department (HOSPITAL_COMMUNITY)
Admission: EM | Admit: 2019-04-17 | Discharge: 2019-04-17 | Disposition: A | Discharge status: Home / Self Care | Payer: 59 | Attending: Emergency Medicine | Admitting: Emergency Medicine

## 2019-04-17 ENCOUNTER — Other Ambulatory Visit: Payer: Self-pay

## 2019-04-17 ENCOUNTER — Encounter (HOSPITAL_COMMUNITY): Payer: Self-pay | Admitting: Emergency Medicine

## 2019-04-17 DIAGNOSIS — R1110 Vomiting, unspecified: Secondary | ICD-10-CM | POA: Diagnosis present

## 2019-04-17 DIAGNOSIS — Z5321 Procedure and treatment not carried out due to patient leaving prior to being seen by health care provider: Secondary | ICD-10-CM | POA: Insufficient documentation

## 2019-04-17 LAB — URINALYSIS, ROUTINE W REFLEX MICROSCOPIC
Bacteria, UA: NONE SEEN
Glucose, UA: NEGATIVE mg/dL
Hgb urine dipstick: NEGATIVE
Ketones, ur: 20 mg/dL — AB
Leukocytes,Ua: NEGATIVE
Nitrite: NEGATIVE
Protein, ur: 30 mg/dL — AB
Specific Gravity, Urine: 1.023 (ref 1.005–1.030)
pH: 5 (ref 5.0–8.0)

## 2019-04-17 NOTE — ED Triage Notes (Signed)
Chemo pt, last chemo 3 weeks ago.  C/o N/V for one month.  Taking phenergan compazine around the clock but not helping.  Vomited 2-3 times in last 24 hours.  Denies diarrhea.  C/o of cough (non-productive) for 7 days.

## 2019-04-19 ENCOUNTER — Other Ambulatory Visit: Payer: Self-pay

## 2019-04-19 ENCOUNTER — Other Ambulatory Visit (HOSPITAL_COMMUNITY): Payer: Self-pay | Admitting: Surgery

## 2019-04-19 ENCOUNTER — Telehealth (HOSPITAL_COMMUNITY): Payer: Self-pay | Admitting: Surgery

## 2019-04-19 ENCOUNTER — Inpatient Hospital Stay (HOSPITAL_COMMUNITY)
Admission: EM | Admit: 2019-04-19 | Discharge: 2019-05-04 | DRG: 193 | Disposition: A | Discharge status: Hospice | Payer: 59 | Attending: Family Medicine | Admitting: Family Medicine

## 2019-04-19 ENCOUNTER — Encounter (HOSPITAL_COMMUNITY): Payer: Self-pay

## 2019-04-19 ENCOUNTER — Emergency Department (HOSPITAL_COMMUNITY): Payer: 59

## 2019-04-19 ENCOUNTER — Other Ambulatory Visit (HOSPITAL_COMMUNITY): Payer: Self-pay | Admitting: Nurse Practitioner

## 2019-04-19 DIAGNOSIS — Z90710 Acquired absence of both cervix and uterus: Secondary | ICD-10-CM

## 2019-04-19 DIAGNOSIS — Z887 Allergy status to serum and vaccine status: Secondary | ICD-10-CM

## 2019-04-19 DIAGNOSIS — K2270 Barrett's esophagus without dysplasia: Secondary | ICD-10-CM | POA: Diagnosis present

## 2019-04-19 DIAGNOSIS — E43 Unspecified severe protein-calorie malnutrition: Secondary | ICD-10-CM | POA: Diagnosis present

## 2019-04-19 DIAGNOSIS — Z515 Encounter for palliative care: Secondary | ICD-10-CM | POA: Diagnosis present

## 2019-04-19 DIAGNOSIS — Z170 Estrogen receptor positive status [ER+]: Secondary | ICD-10-CM

## 2019-04-19 DIAGNOSIS — Z9889 Other specified postprocedural states: Secondary | ICD-10-CM

## 2019-04-19 DIAGNOSIS — Z20828 Contact with and (suspected) exposure to other viral communicable diseases: Secondary | ICD-10-CM | POA: Diagnosis present

## 2019-04-19 DIAGNOSIS — C50912 Malignant neoplasm of unspecified site of left female breast: Secondary | ICD-10-CM | POA: Diagnosis present

## 2019-04-19 DIAGNOSIS — Z803 Family history of malignant neoplasm of breast: Secondary | ICD-10-CM

## 2019-04-19 DIAGNOSIS — C787 Secondary malignant neoplasm of liver and intrahepatic bile duct: Secondary | ICD-10-CM | POA: Diagnosis present

## 2019-04-19 DIAGNOSIS — R945 Abnormal results of liver function studies: Secondary | ICD-10-CM | POA: Diagnosis not present

## 2019-04-19 DIAGNOSIS — J90 Pleural effusion, not elsewhere classified: Secondary | ICD-10-CM | POA: Diagnosis not present

## 2019-04-19 DIAGNOSIS — Z66 Do not resuscitate: Secondary | ICD-10-CM | POA: Diagnosis not present

## 2019-04-19 DIAGNOSIS — R17 Unspecified jaundice: Secondary | ICD-10-CM | POA: Diagnosis not present

## 2019-04-19 DIAGNOSIS — F329 Major depressive disorder, single episode, unspecified: Secondary | ICD-10-CM | POA: Diagnosis present

## 2019-04-19 DIAGNOSIS — D849 Immunodeficiency, unspecified: Secondary | ICD-10-CM | POA: Diagnosis present

## 2019-04-19 DIAGNOSIS — R000 Tachycardia, unspecified: Secondary | ICD-10-CM | POA: Diagnosis present

## 2019-04-19 DIAGNOSIS — Z9109 Other allergy status, other than to drugs and biological substances: Secondary | ICD-10-CM

## 2019-04-19 DIAGNOSIS — J45909 Unspecified asthma, uncomplicated: Secondary | ICD-10-CM | POA: Diagnosis present

## 2019-04-19 DIAGNOSIS — J9811 Atelectasis: Secondary | ICD-10-CM | POA: Diagnosis present

## 2019-04-19 DIAGNOSIS — J189 Pneumonia, unspecified organism: Secondary | ICD-10-CM | POA: Diagnosis present

## 2019-04-19 DIAGNOSIS — E871 Hypo-osmolality and hyponatremia: Secondary | ICD-10-CM | POA: Diagnosis present

## 2019-04-19 DIAGNOSIS — R0602 Shortness of breath: Secondary | ICD-10-CM

## 2019-04-19 DIAGNOSIS — R748 Abnormal levels of other serum enzymes: Secondary | ICD-10-CM | POA: Diagnosis not present

## 2019-04-19 DIAGNOSIS — G43909 Migraine, unspecified, not intractable, without status migrainosus: Secondary | ICD-10-CM | POA: Diagnosis present

## 2019-04-19 DIAGNOSIS — Z825 Family history of asthma and other chronic lower respiratory diseases: Secondary | ICD-10-CM

## 2019-04-19 DIAGNOSIS — K766 Portal hypertension: Secondary | ICD-10-CM | POA: Diagnosis present

## 2019-04-19 DIAGNOSIS — K831 Obstruction of bile duct: Secondary | ICD-10-CM

## 2019-04-19 DIAGNOSIS — Z888 Allergy status to other drugs, medicaments and biological substances status: Secondary | ICD-10-CM

## 2019-04-19 DIAGNOSIS — Z6827 Body mass index (BMI) 27.0-27.9, adult: Secondary | ICD-10-CM

## 2019-04-19 DIAGNOSIS — K228 Other specified diseases of esophagus: Secondary | ICD-10-CM | POA: Diagnosis not present

## 2019-04-19 DIAGNOSIS — R52 Pain, unspecified: Secondary | ICD-10-CM

## 2019-04-19 DIAGNOSIS — K219 Gastro-esophageal reflux disease without esophagitis: Secondary | ICD-10-CM | POA: Diagnosis present

## 2019-04-19 DIAGNOSIS — K3189 Other diseases of stomach and duodenum: Secondary | ICD-10-CM | POA: Diagnosis present

## 2019-04-19 DIAGNOSIS — Z8249 Family history of ischemic heart disease and other diseases of the circulatory system: Secondary | ICD-10-CM

## 2019-04-19 DIAGNOSIS — R0603 Acute respiratory distress: Secondary | ICD-10-CM | POA: Diagnosis not present

## 2019-04-19 DIAGNOSIS — R918 Other nonspecific abnormal finding of lung field: Secondary | ICD-10-CM | POA: Diagnosis present

## 2019-04-19 DIAGNOSIS — K7200 Acute and subacute hepatic failure without coma: Secondary | ICD-10-CM | POA: Diagnosis present

## 2019-04-19 DIAGNOSIS — Z79899 Other long term (current) drug therapy: Secondary | ICD-10-CM

## 2019-04-19 DIAGNOSIS — J181 Lobar pneumonia, unspecified organism: Secondary | ICD-10-CM | POA: Diagnosis not present

## 2019-04-19 DIAGNOSIS — J9601 Acute respiratory failure with hypoxia: Secondary | ICD-10-CM | POA: Diagnosis present

## 2019-04-19 DIAGNOSIS — Z7189 Other specified counseling: Secondary | ICD-10-CM | POA: Diagnosis not present

## 2019-04-19 DIAGNOSIS — D649 Anemia, unspecified: Secondary | ICD-10-CM | POA: Diagnosis present

## 2019-04-19 DIAGNOSIS — C50919 Malignant neoplasm of unspecified site of unspecified female breast: Secondary | ICD-10-CM | POA: Diagnosis present

## 2019-04-19 DIAGNOSIS — Z818 Family history of other mental and behavioral disorders: Secondary | ICD-10-CM

## 2019-04-19 DIAGNOSIS — R7989 Other specified abnormal findings of blood chemistry: Secondary | ICD-10-CM | POA: Diagnosis present

## 2019-04-19 DIAGNOSIS — R180 Malignant ascites: Secondary | ICD-10-CM | POA: Diagnosis present

## 2019-04-19 DIAGNOSIS — E876 Hypokalemia: Secondary | ICD-10-CM | POA: Diagnosis present

## 2019-04-19 DIAGNOSIS — J910 Malignant pleural effusion: Secondary | ICD-10-CM | POA: Diagnosis present

## 2019-04-19 DIAGNOSIS — F32A Depression, unspecified: Secondary | ICD-10-CM | POA: Diagnosis present

## 2019-04-19 DIAGNOSIS — E860 Dehydration: Secondary | ICD-10-CM | POA: Diagnosis present

## 2019-04-19 DIAGNOSIS — K7682 Hepatic encephalopathy: Secondary | ICD-10-CM | POA: Diagnosis present

## 2019-04-19 DIAGNOSIS — Z880 Allergy status to penicillin: Secondary | ICD-10-CM

## 2019-04-19 DIAGNOSIS — F419 Anxiety disorder, unspecified: Secondary | ICD-10-CM | POA: Diagnosis present

## 2019-04-19 DIAGNOSIS — G893 Neoplasm related pain (acute) (chronic): Secondary | ICD-10-CM | POA: Diagnosis present

## 2019-04-19 DIAGNOSIS — R112 Nausea with vomiting, unspecified: Secondary | ICD-10-CM | POA: Diagnosis not present

## 2019-04-19 DIAGNOSIS — Z9013 Acquired absence of bilateral breasts and nipples: Secondary | ICD-10-CM

## 2019-04-19 DIAGNOSIS — I10 Essential (primary) hypertension: Secondary | ICD-10-CM | POA: Diagnosis present

## 2019-04-19 DIAGNOSIS — N3281 Overactive bladder: Secondary | ICD-10-CM | POA: Diagnosis present

## 2019-04-19 DIAGNOSIS — R188 Other ascites: Secondary | ICD-10-CM | POA: Diagnosis not present

## 2019-04-19 DIAGNOSIS — K5900 Constipation, unspecified: Secondary | ICD-10-CM | POA: Diagnosis present

## 2019-04-19 DIAGNOSIS — Z09 Encounter for follow-up examination after completed treatment for conditions other than malignant neoplasm: Secondary | ICD-10-CM

## 2019-04-19 DIAGNOSIS — Z801 Family history of malignant neoplasm of trachea, bronchus and lung: Secondary | ICD-10-CM

## 2019-04-19 LAB — COMPREHENSIVE METABOLIC PANEL
ALT: 62 U/L — ABNORMAL HIGH (ref 0–44)
AST: 170 U/L — ABNORMAL HIGH (ref 15–41)
Albumin: 2.4 g/dL — ABNORMAL LOW (ref 3.5–5.0)
Alkaline Phosphatase: 449 U/L — ABNORMAL HIGH (ref 38–126)
Anion gap: 10 (ref 5–15)
BUN: 7 mg/dL (ref 6–20)
CO2: 25 mmol/L (ref 22–32)
Calcium: 8.3 mg/dL — ABNORMAL LOW (ref 8.9–10.3)
Chloride: 96 mmol/L — ABNORMAL LOW (ref 98–111)
Creatinine, Ser: 0.52 mg/dL (ref 0.44–1.00)
GFR calc Af Amer: >60 mL/min (ref >60)
GFR calc non Af Amer: >60 mL/min (ref >60)
Glucose, Bld: 112 mg/dL — ABNORMAL HIGH (ref 70–99)
Potassium: 2.9 mmol/L — ABNORMAL LOW (ref 3.5–5.1)
Sodium: 131 mmol/L — ABNORMAL LOW (ref 135–145)
Total Bilirubin: 4.3 mg/dL — ABNORMAL HIGH (ref 0.3–1.2)
Total Protein: 6 g/dL — ABNORMAL LOW (ref 6.5–8.1)

## 2019-04-19 LAB — CBC WITH DIFFERENTIAL/PLATELET
Abs Immature Granulocytes: 0.03 10*3/uL (ref 0.00–0.07)
Basophils Absolute: 0.1 10*3/uL (ref 0.0–0.1)
Basophils Relative: 1 %
Eosinophils Absolute: 0.1 10*3/uL (ref 0.0–0.5)
Eosinophils Relative: 1 %
HCT: 35.4 % — ABNORMAL LOW (ref 36.0–46.0)
Hemoglobin: 11.6 g/dL — ABNORMAL LOW (ref 12.0–15.0)
Immature Granulocytes: 0 %
Lymphocytes Relative: 7 %
Lymphs Abs: 0.7 10*3/uL (ref 0.7–4.0)
MCH: 26.3 pg (ref 26.0–34.0)
MCHC: 32.8 g/dL (ref 30.0–36.0)
MCV: 80.3 fL (ref 80.0–100.0)
Monocytes Absolute: 1.1 10*3/uL — ABNORMAL HIGH (ref 0.1–1.0)
Monocytes Relative: 11 %
Neutro Abs: 8.2 10*3/uL — ABNORMAL HIGH (ref 1.7–7.7)
Neutrophils Relative %: 80 %
Platelets: 183 10*3/uL (ref 150–400)
RBC: 4.41 MIL/uL (ref 3.87–5.11)
RDW: 21.1 % — ABNORMAL HIGH (ref 11.5–15.5)
WBC: 10.1 10*3/uL (ref 4.0–10.5)
nRBC: 0 % (ref 0.0–0.2)

## 2019-04-19 LAB — TROPONIN I (HIGH SENSITIVITY)
Troponin I (High Sensitivity): 8 ng/L (ref <18)
Troponin I (High Sensitivity): 8 ng/L (ref <18)

## 2019-04-19 LAB — MAGNESIUM: Magnesium: 1.5 mg/dL — ABNORMAL LOW (ref 1.7–2.4)

## 2019-04-19 LAB — BRAIN NATRIURETIC PEPTIDE: B Natriuretic Peptide: 38 pg/mL (ref 0.0–100.0)

## 2019-04-19 LAB — LIPASE, BLOOD: Lipase: 15 U/L (ref 11–51)

## 2019-04-19 LAB — LACTIC ACID, PLASMA
Lactic Acid, Venous: 2 mmol/L (ref 0.5–1.9)
Lactic Acid, Venous: 2.2 mmol/L (ref 0.5–1.9)

## 2019-04-19 IMAGING — DX DG CHEST 1V PORT
1 series · 1 of 1 positions shown · non-contrast
Comparison: [DATE] and earlier.

CLINICAL DATA: 48-year-old female with shortness of breath for 1
week. Productive cough. Undergoing chemotherapy for breast cancer.

EXAM:
PORTABLE CHEST 1 VIEW

[chest ap]
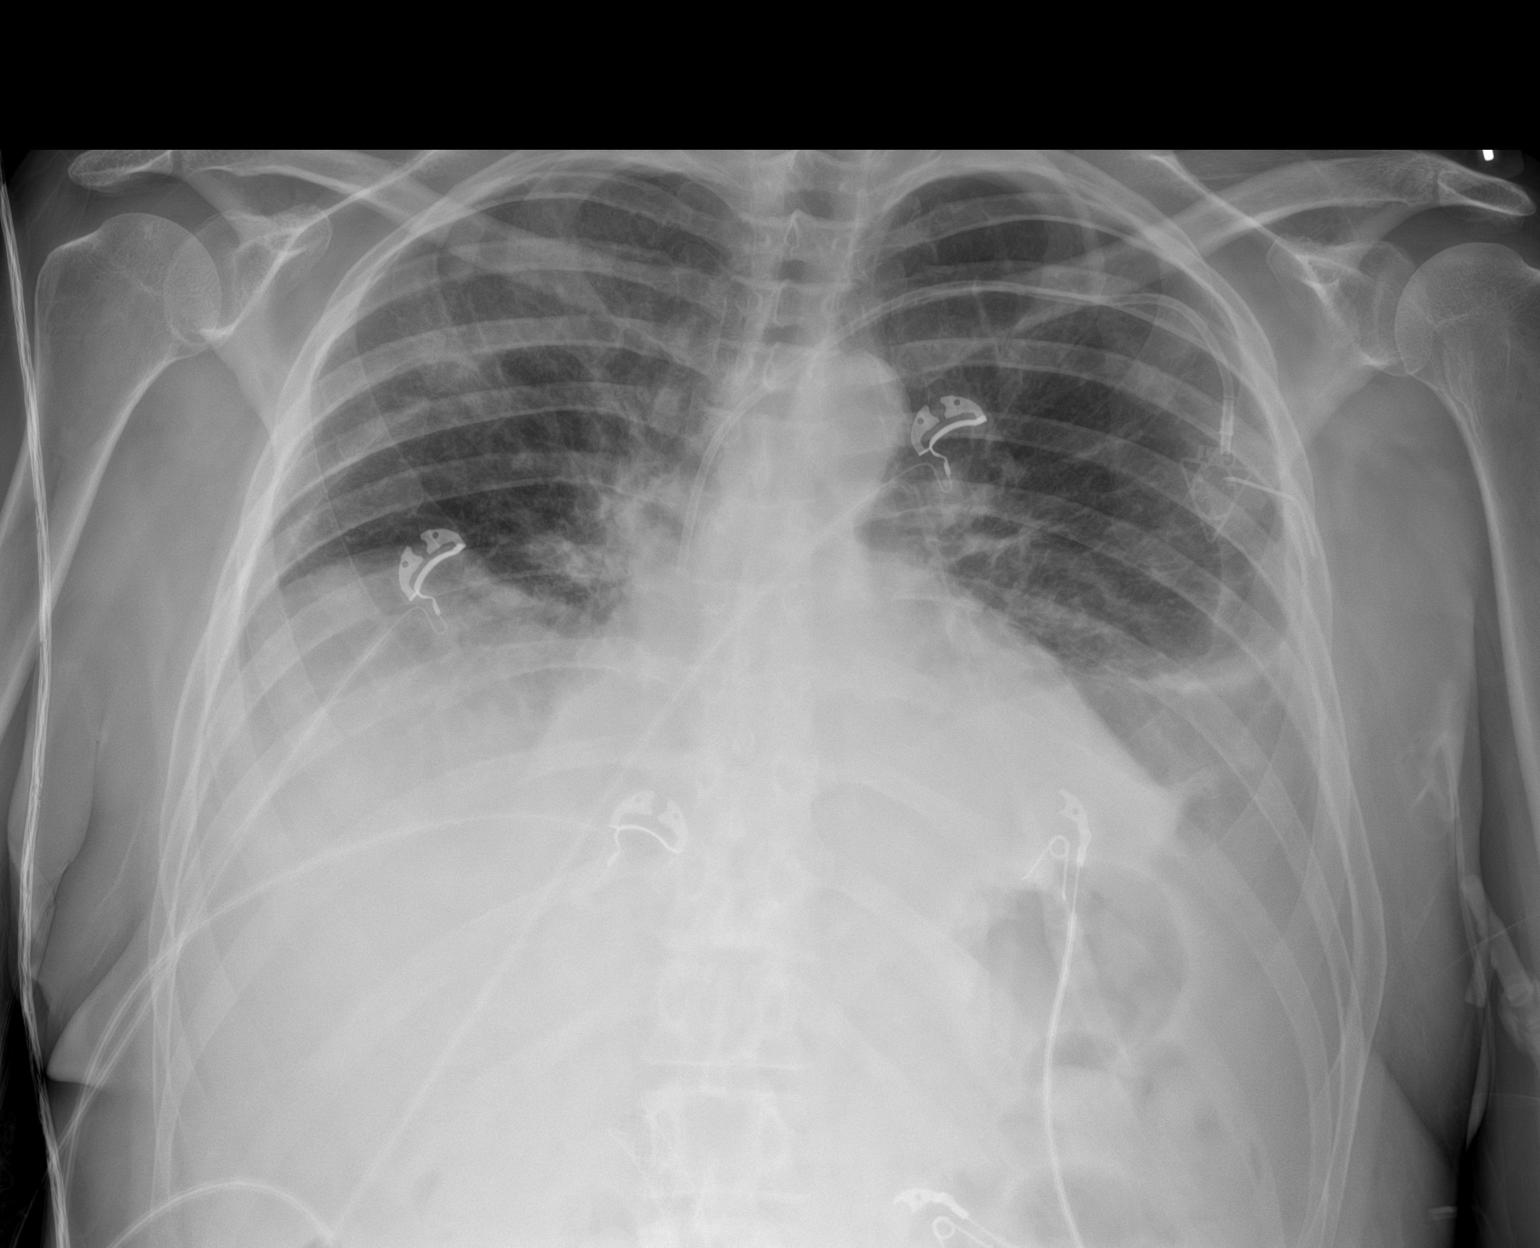

[1 of 1 positions shown; findings below may reference images not displayed]

FINDINGS: Portable AP upright view at [55] hours. Stable left chest
Port-A-Cath. Continued bibasilar opacity which on CT the same day as
prior was related to moderate to large bilateral pleural effusions.
No superimposed pneumothorax. Pulmonary vascularity has decreased.
Mild nonspecific patchy right upper lobe opacity now suspected at
the anterior 2nd rib level. No other confluent opacity. Stable
cardiac size and mediastinal contours. Stable visible bowel gas
pattern. Ascites present before. Metal CBD stent redemonstrated.
Stable visualized osseous structures.
IMPRESSION: 1. Suspect ongoing bilateral pleural effusions although pulmonary
vascularity has decreased from prior.
2. Patchy nonspecific right upper lobe opacity. Pulmonary infection
is possible.
3. Stable radiographic appearance of the upper abdomen.

## 2019-04-19 MED ORDER — LEVOFLOXACIN IN D5W 750 MG/150ML IV SOLN
750.0000 mg | Freq: Once | INTRAVENOUS | Status: DC
  Filled 2019-04-19: qty 150

## 2019-04-19 MED ORDER — POLYETHYLENE GLYCOL 3350 17 G PO PACK
17.0000 g | PACK | Freq: Every day | ORAL | Status: DC | PRN
  Administered 2019-04-22: 17 g via ORAL
  Filled 2019-04-19 (×2): qty 1

## 2019-04-19 MED ORDER — POTASSIUM CHLORIDE IN NACL 40-0.9 MEQ/L-% IV SOLN
INTRAVENOUS | Status: AC
  Administered 2019-04-19: 75 mL/h via INTRAVENOUS

## 2019-04-19 MED ORDER — LEVOFLOXACIN IN D5W 750 MG/150ML IV SOLN
750.0000 mg | Freq: Once | INTRAVENOUS | Status: AC
  Administered 2019-04-19: 750 mg via INTRAVENOUS

## 2019-04-19 MED ORDER — FUROSEMIDE 10 MG/ML IJ SOLN
40.0000 mg | INTRAMUSCULAR | Status: AC
  Administered 2019-04-19: 40 mg via INTRAVENOUS
  Filled 2019-04-19: qty 4

## 2019-04-19 MED ORDER — ACETAMINOPHEN 325 MG PO TABS
650.0000 mg | ORAL_TABLET | Freq: Four times a day (QID) | ORAL | Status: DC | PRN
  Administered 2019-04-21: 650 mg via ORAL
  Filled 2019-04-19: qty 2

## 2019-04-19 MED ORDER — OXYCODONE-ACETAMINOPHEN 5-325 MG PO TABS: 1.0000 | ORAL_TABLET | ORAL | 0 refills | Status: DC | PRN

## 2019-04-19 MED ORDER — MAGNESIUM SULFATE 2 GM/50ML IV SOLN
2.0000 g | Freq: Once | INTRAVENOUS | Status: AC
  Administered 2019-04-19: 2 g via INTRAVENOUS
  Filled 2019-04-19: qty 50

## 2019-04-19 MED ORDER — SODIUM CHLORIDE 0.9 % IV BOLUS
500.0000 mL | Freq: Once | INTRAVENOUS | Status: AC
  Administered 2019-04-19: 500 mL via INTRAVENOUS

## 2019-04-19 MED ORDER — METOPROLOL TARTRATE 25 MG PO TABS: 12.5000 mg | ORAL_TABLET | Freq: Two times a day (BID) | ORAL | Status: DC

## 2019-04-19 MED ORDER — ONDANSETRON HCL 4 MG PO TABS: 4.0000 mg | ORAL_TABLET | Freq: Four times a day (QID) | ORAL | Status: DC | PRN

## 2019-04-19 MED ORDER — MAGNESIUM SULFATE 2 GM/50ML IV SOLN
2.0000 g | INTRAVENOUS | Status: AC
  Administered 2019-04-19: 2 g via INTRAVENOUS
  Filled 2019-04-19: qty 50

## 2019-04-19 MED ORDER — POTASSIUM CHLORIDE 10 MEQ/100ML IV SOLN
10.0000 meq | Freq: Once | INTRAVENOUS | Status: AC
  Administered 2019-04-19: 10 meq via INTRAVENOUS
  Filled 2019-04-19: qty 100

## 2019-04-19 MED ORDER — ENSURE ENLIVE PO LIQD: 237.0000 mL | Freq: Two times a day (BID) | ORAL | Status: DC

## 2019-04-19 MED ORDER — POTASSIUM CHLORIDE CRYS ER 20 MEQ PO TBCR
40.0000 meq | EXTENDED_RELEASE_TABLET | Freq: Once | ORAL | Status: AC
  Administered 2019-04-19: 40 meq via ORAL
  Filled 2019-04-19: qty 2

## 2019-04-19 MED ORDER — OXYCODONE-ACETAMINOPHEN 5-325 MG PO TABS
1.0000 | ORAL_TABLET | Freq: Four times a day (QID) | ORAL | Status: DC | PRN
  Administered 2019-04-19 – 2019-04-27 (×5): 1 via ORAL
  Filled 2019-04-19 (×7): qty 1

## 2019-04-19 MED ORDER — ONDANSETRON HCL 4 MG/2ML IJ SOLN
4.0000 mg | Freq: Four times a day (QID) | INTRAMUSCULAR | Status: DC | PRN
  Administered 2019-04-21 – 2019-04-22 (×2): 4 mg via INTRAVENOUS
  Filled 2019-04-19 (×2): qty 2

## 2019-04-19 MED ORDER — ENOXAPARIN SODIUM 40 MG/0.4ML ~~LOC~~ SOLN
40.0000 mg | SUBCUTANEOUS | Status: DC
  Administered 2019-04-19 – 2019-04-26 (×8): 40 mg via SUBCUTANEOUS
  Filled 2019-04-19 (×8): qty 0.4

## 2019-04-19 MED ORDER — TEMAZEPAM 15 MG PO CAPS
15.0000 mg | ORAL_CAPSULE | Freq: Every evening | ORAL | Status: DC | PRN
  Administered 2019-04-20 – 2019-04-23 (×4): 15 mg via ORAL
  Filled 2019-04-19 (×5): qty 1

## 2019-04-19 MED ORDER — ACETAMINOPHEN 650 MG RE SUPP: 650.0000 mg | Freq: Four times a day (QID) | RECTAL | Status: DC | PRN

## 2019-04-19 MED ORDER — GUAIFENESIN-DM 100-10 MG/5ML PO SYRP
10.0000 mL | ORAL_SOLUTION | Freq: Three times a day (TID) | ORAL | Status: AC
  Administered 2019-04-19 – 2019-04-21 (×5): 10 mL via ORAL
  Filled 2019-04-19 (×5): qty 10

## 2019-04-19 MED ORDER — PANTOPRAZOLE SODIUM 40 MG PO TBEC
40.0000 mg | DELAYED_RELEASE_TABLET | Freq: Every morning | ORAL | Status: DC
  Administered 2019-04-20 – 2019-04-24 (×5): 40 mg via ORAL
  Filled 2019-04-19 (×5): qty 1

## 2019-04-19 MED ORDER — LEVOFLOXACIN IN D5W 750 MG/150ML IV SOLN
750.0000 mg | INTRAVENOUS | Status: AC
  Administered 2019-04-20 – 2019-04-24 (×5): 750 mg via INTRAVENOUS
  Filled 2019-04-19 (×5): qty 150

## 2019-04-19 MED ORDER — ALBUTEROL SULFATE (2.5 MG/3ML) 0.083% IN NEBU: 3.0000 mL | INHALATION_SOLUTION | RESPIRATORY_TRACT | Status: DC | PRN

## 2019-04-19 NOTE — H&P (Addendum)
History and Physical    Pamela Long [id number] DOB: 08-Jan-1971 DOA: 04/19/2019  PCP: Doree Albee, MD   Patient coming from: home  I have personally briefly reviewed patient's old medical records in [place]  Chief Complaint: Shortness of breath, cough  HPI: Pamela Long is a 48 y.o. female with medical history significant for metastatic breast cancer, depression, asthma.  Patient presented to the ED with complaints of worsening cough productive of whitish sputum and difficulty breathing over the past 1 week.  She reports persistent pleuritic chest pain from recent hospitalization and is worse with coughing and difficulty breathing.  No known Covid positive contacts.  Chronic mild unchanged lower extremity swelling slightly worse on the left.  Reports poor p.o. intake with nausea and occasional vomiting.  No loose stools.  Recent hospitalization 10/31 - 11/1, for intractable nausea and vomiting with generalized weakness, thought secondary to chemo treatment and ongoing metastatic malignancy.  Had CTA chest done for pleuritic chest discomfort and tachycardia which was negative for pulmonary embolism.  ED Course: Tachycardic to 119, O2 sats 89% on room air.  Covid test ordered and pending.  WBC 10.1, potassium 2.9.  Portable chest x-ray ongoing bilateral pleural effusion suspected, with reduced pulmonary vascularity, patchy right upper lobe opacity possibly pulmonary infection.  IV Levaquin, IV Lasix 40 mg x 1.  Hospitalist to admit for further management.  Review of Systems: As per HPI all other systems reviewed and negative.  Past Medical History:  Diagnosis Date  . Anemia   . Anxiety   . Asthma   . Breast cancer ([place])    Left, 2017  . Complication of anesthesia   . Depression   . GERD (gastroesophageal reflux disease)   . Migraine   . OAB (overactive bladder)   . PONV (postoperative nausea and vomiting)   . Seasonal allergies     Past Surgical History:   Procedure Laterality Date  . ABDOMINAL HYSTERECTOMY     breast cancer  . ANKLE RECONSTRUCTION Right 1989  . BILIARY STENT PLACEMENT N/A 09/22/2018   Procedure: BILIARY STENT PLACEMENT;  Surgeon: Rogene Houston, MD;  [facility];  Service: Endoscopy;  Laterality: N/A;  . BILIARY STENT PLACEMENT N/A 03/16/2019   Procedure: BILIARY STENT EXCHANGE;  Surgeon: Rogene Houston, MD;  Location: [facility];  Service: Endoscopy;  Laterality: N/A;  . BREAST SURGERY    . COLONOSCOPY WITH PROPOFOL N/A 08/06/2018   Dr. Oneida Alar: small internal hemorrhoids, moderate external hemorrhoids. rectal bleeding due to ?anal fissure vs hemorrhoids. nex tcs 10 years  . ERCP N/A 09/22/2018   Procedure: ENDOSCOPIC RETROGRADE CHOLANGIOPANCREATOGRAPHY (ERCP);  Surgeon: Rogene Houston, MD;  [facility];  Service: Endoscopy;  Laterality: N/A;  . ERCP N/A 03/16/2019   Procedure: ENDOSCOPIC RETROGRADE CHOLANGIOPANCREATOGRAPHY (ERCP);  Surgeon: Rogene Houston, MD;  [facility];  Service: Endoscopy;  Laterality: N/A;  . FOREIGN BODY REMOVAL N/A 08/29/2017   from lip  . KNEE SURGERY Right 1989   bone spur  . MASTECTOMY  2017   bil mastectomies  . PORTACATH PLACEMENT Left 12/23/2018   Procedure: INSERTION PORT-A-CATH LEFT SUBCLAVIAN;  Surgeon: Aviva Signs, MD;  Location: [facility];  Service: General;  Laterality: Left;  . SINUSOTOMY       reports that she has never smoked. She has never used smokeless tobacco. She reports that she does not drink alcohol or use drugs.  Allergies  Allergen Reactions  . Salagen [  Pilocarpine] Other (See Comments)    Can cause liver failure   . Tape Itching and Other (See Comments)    Depending on the adhesive-blistering occurs  . Amoxicillin Hives and Other (See Comments)    Rash only DID THE REACTION INVOLVE: Swelling of the face/tongue/throat, SOB, or low BP? Sudden or severe rash/hives, skin peeling, or the inside of the mouth or nose?  Did it  require medical treatment?  When did it last happen? If all above answers are "NO", may proceed with cephalosporin use.   . Caffeine Diarrhea, Nausea Only, Palpitations and Other (See Comments)    Headache  . Tetanus Toxoids Swelling and Other (See Comments)    Local reaction    Family History  Problem Relation Age of Onset  . Arthritis Mother   . COPD Mother   . Depression Mother   . Diabetes Mother   . Kidney disease Father   . Heart disease Father 68  . Drug abuse Father   . Hypertension Father   . Diabetes Daughter   . Hashimoto's thyroiditis Daughter   . Irritable bowel syndrome Daughter   . Autism spectrum disorder Daughter   . Early death Maternal Grandmother        drowned  . Early death Maternal Grandfather   . Heart disease Maternal Grandfather   . Heart disease Paternal Grandfather   . Breast cancer Maternal Aunt   . Breast cancer Cousin   . Lung cancer Maternal Aunt   . Lung cancer Maternal Uncle   . Colon cancer Neg Hx     Prior to Admission medications   Medication Sig Start Date End Date Taking? Authorizing Provider  escitalopram (LEXAPRO) 20 MG tablet Take 1 tablet (20 mg total) by mouth daily. 02/12/17  Yes Raylene Everts, MD  furosemide (LASIX) 20 MG tablet TAKE 1 TABLET BY MOUTH EVERY DAY Patient taking differently: Take 20 mg by mouth daily.  04/13/19  Yes Lockamy, Randi L, NP-C  gabapentin (NEURONTIN) 300 MG capsule Take 1 capsule (300 mg total) by mouth at bedtime. 01/07/19  Yes Derek Jack, MD  lubiprostone (AMITIZA) 8 MCG capsule Take 1 capsule (8 mcg total) by mouth 2 (two) times daily with a meal. 03/02/19  Yes Mahala Menghini, PA-C  magnesium oxide (MAG-OX) 400 MG tablet TAKE 1 TABLET BY MOUTH EVERY DAY 12/21/18  Yes Lockamy, Randi L, NP-C  metoprolol tartrate (LOPRESSOR) 25 MG tablet Take 0.5 tablets (12.5 mg total) by mouth 2 (two) times daily. 04/11/19 04/10/20 Yes Barton Dubois, MD  nystatin-triamcinolone [facility] II) cream Apply  1 application topically 2 (two) times daily.  02/24/19  Yes [provider]  oxyCODONE-acetaminophen (PERCOCET/ROXICET) 5-325 MG tablet Take 1 tablet by mouth every 4 (four) hours as needed for severe pain. 04/19/19  Yes Lockamy, Randi L, NP-C  PACLitaxel (TAXOL IV) Inject into the vein every 14 (fourteen) days.    Yes [provider]  pantoprazole (PROTONIX) 40 MG tablet TAKE 1 TABLET BY MOUTH EVERY DAY BEFORE BREAKFAST Patient taking differently: Take 40 mg by mouth every morning.  02/18/19  Yes Derek Jack, MD  potassium chloride (KLOR-CON) 10 MEQ tablet Take 10 mEq by mouth 2 (two) times daily.   Yes [provider]  prochlorperazine (COMPAZINE) 10 MG tablet Take 1 tablet (10 mg total) by mouth every 6 (six) hours as needed for nausea or vomiting. 04/08/19  Yes Derek Jack, MD  promethazine (PHENERGAN) 25 MG tablet Take 1 tablet (25 mg total) by mouth  every 8 (eight) hours as needed for refractory nausea / vomiting. 04/15/19  Yes Derek Jack, MD  scopolamine (TRANSDERM-SCOP) 1 MG/3DAYS Place 1 patch (1.5 mg total) onto the skin every 3 (three) days. 01/18/19  Yes Derek Jack, MD  spironolactone (ALDACTONE) 25 MG tablet TAKE 1 TABLET BY MOUTH EVERY DAY Patient taking differently: Take 25 mg by mouth daily.  04/13/19  Yes Lockamy, Randi L, NP-C  temazepam (RESTORIL) 15 MG capsule TAKE 1 CAPSULE (15 MG TOTAL) BY MOUTH AT BEDTIME AS NEEDED FOR SLEEP. Patient taking differently: Take 15 mg by mouth at bedtime.  02/22/19  Yes Derek Jack, MD    Physical Exam: Vitals:   04/19/19 1530 04/19/19 1554 04/19/19 1600 04/19/19 1615  BP: 137/85  131/86   Pulse:  (!) 114 (!) 119 (!) 111  Resp:  14 17 19   Temp:      TempSrc:      SpO2:  98% 92% 96%  Weight:      Height:        Constitutional: NAD, calm, comfortable, chronically ill-appearing Vitals:   04/19/19 1530 04/19/19 1554 04/19/19 1600 04/19/19 1615  BP: 137/85  131/86    Pulse:  (!) 114 (!) 119 (!) 111  Resp:  14 17 19   Temp:      TempSrc:      SpO2:  98% 92% 96%  Weight:      Height:       Eyes: PERRL, lids and conjunctivae normal ENMT: Mucous membranes are moist. Posterior pharynx clear of any exudate or lesions.Normal dentition.  Neck: normal, supple, no masses, no thyromegaly Respiratory: clear to auscultation bilaterally, no wheezing, no crackles. Normal respiratory effort. No accessory muscle use.  Cardiovascular: Tachycardic but regular rate and rhythm, no murmurs / rubs / gallops.  Trace bilateral lower extremity edema L >> R. . 2+ pedal pulses. Abdomen: no tenderness, no masses palpated. No hepatosplenomegaly. Bowel sounds positive.  Musculoskeletal: no clubbing / cyanosis. No joint deformity upper and lower extremities. Good ROM, no contractures. Normal muscle tone.  Skin: no rashes, lesions, ulcers. No induration Neurologic: CN 2-12 grossly intact. Strength 5/5 in all 4.  Psychiatric: Normal judgment and insight. Alert and oriented x 3. Normal mood.   Labs on Admission: I have personally reviewed following labs and imaging studies  CBC: Recent Labs  Lab 04/19/19 1533  WBC 10.1  NEUTROABS 8.2*  HGB 11.6*  HCT 35.4*  MCV 80.3  PLT [id number]   Basic Metabolic Panel: Recent Labs  Lab 04/19/19 1533  NA 131*  K 2.9*  CL 96*  CO2 25  GLUCOSE 112*  BUN 7  CREATININE 0.52  CALCIUM 8.3*   Liver Function Tests: Recent Labs  Lab 04/19/19 1533  AST 170*  ALT 62*  ALKPHOS 449*  BILITOT 4.3*  PROT 6.0*  ALBUMIN 2.4*   Recent Labs  Lab 04/19/19 1533  LIPASE 15    Urine analysis:    Component Value Date/Time   COLORURINE AMBER (A) 04/17/2019 1321   APPEARANCEUR CLEAR 04/17/2019 1321   LABSPEC 1.023 04/17/2019 1321   PHURINE 5.0 04/17/2019 1321   GLUCOSEU NEGATIVE 04/17/2019 1321   HGBUR NEGATIVE 04/17/2019 1321   BILIRUBINUR SMALL (A) 04/17/2019 1321   KETONESUR 20 (A) 04/17/2019 1321   PROTEINUR 30 (A) 04/17/2019 1321    NITRITE NEGATIVE 04/17/2019 1321   LEUKOCYTESUR NEGATIVE 04/17/2019 1321    Radiological Exams on Admission: Dg Chest Port 1 View  Result Date: 04/19/2019 CLINICAL DATA:  48 year old female  with shortness of breath for 1 week. Productive cough. Undergoing chemotherapy for breast cancer. EXAM: PORTABLE CHEST 1 VIEW COMPARISON:  04/10/2019 and earlier. FINDINGS: Portable AP upright view at 1620 hours. Stable left chest Port-A-Cath. Continued bibasilar opacity which on CT the same day as prior was related to moderate to large bilateral pleural effusions. No superimposed pneumothorax. Pulmonary vascularity has decreased. Mild nonspecific patchy right upper lobe opacity now suspected at the anterior 2nd rib level. No other confluent opacity. Stable cardiac size and mediastinal contours. Stable visible bowel gas pattern. Ascites present before. Metal CBD stent redemonstrated. Stable visualized osseous structures. IMPRESSION: 1. Suspect ongoing bilateral pleural effusions although pulmonary vascularity has decreased from prior. 2. Patchy nonspecific right upper lobe opacity. Pulmonary infection is possible. 3. Stable radiographic appearance of the upper abdomen. Electronically Signed   By: Genevie Ann M.D.   On: 04/19/2019 16:41    EKG: Independently reviewed.  Sinus tachycardia rate 119.  QTc 462.  No significant changes compared to prior EKG.  Assessment/Plan Principal Problem:   Pneumonia Active Problems:   Asthma   Depression   Metastatic breast cancer ([place])  Pneumonia with acute respiratory failure-productive cough, dyspnea, O2 sats 88% on room air, tachycardia is chronic.  WBC 10.1.  Afebrile.  She otherwise rules out for sepsis, lactic acid 2 likely from dehydration-poor p.o. intake.  Recent negative Covid test 10/31.  BNP unremarkable at 38. Weight is at baseline. Portable chest x-ray is-ongoing bilateral pleural effusion suspected, decreased creased pulmonary vascularity, patchy right upper lobe  opacity possibly infection.  Recent hospitalization for just 1 day.  Not meeting HCAP criteria, but immunocompromised.   - Recent CTA for chest pain and tachycardia negative for PE, Hs Troponin negative x 2, EKG unchanged -Will continue IV Levaquin started in ED -CBC, BMP a.m. -Follow-up COVID-19 test -Mucolytics, supplemental O2 -As needed albuterol inhaler -40 mg IV Lasix given in ED, will hold off on further Lasix dose  - 500 mill bolus N/s, cont n/s + 40KCL 75cc/hr x 12 hrs. -Trend lactic acid  Elevated liver enzymes-chronically elevated but trending up, AST 170, ALT 62, ALP 449, bilirubin 4.3.  Per Dr. Tomie China notes 10/30 - ERCP and plastic stent placement in the hepatic duct on 09/22/2018, changed to metal stent on 03/18/2019.  Recent ultrasound-guided paracentesis 11/6, 4.9 L of yellow ascitic fluid removed, from hepatic metastatic disease. -Repeat CMP in a.m. -Will consult oncology in-house -Hold home spironolactone and Lasix for now.  Metastatic breast cancer-follows with oncologist Dr. [name].  Last chemotherapy with paclitaxel 03/25/2019, currently on hold due to elevated bilirubin. -Oncology consult in a.m. -Resume home narcotics  Electrolyte abnormalities- potassium 2.9, sodium 131.  Likely from Lasix, poor p.o. intake, nausea -Replete -Check magnesium-1.5, replete  Asthma history- Stable - PRN alb  Hx of depression -  -Resume home PRN temazepam nightly  Tachycardia -chronic and persistent -Reports she was recently started on a new medication for her fast heart rate, which she has not started, likely metoprolol, will hold 12.5 mg for now  DVT prophylaxis: Lovenox Code Status: Full code Family Communication: None at bedside Disposition Plan: > 2 days Consults called: Oncology Admission status: Inpt, tele I certify that at the point of admission it is my clinical judgment that the patient will require inpatient hospital care spanning beyond 2 midnights from  the point of admission due to high intensity of service, high risk for further deterioration and high frequency of surveillance required. The following factors support the patient status of  inpatient: Community acquired pneumonia in immunocompromised requiring IV antibiotics.   Bethena Roys MD [facility]  04/19/2019, 7:13 PM

## 2019-04-19 NOTE — ED Triage Notes (Signed)
Pt brought to ED via [place] for SOB x 1 week. Pt currently getting chemo for breast CA. Pt with productive cough, denies fever.

## 2019-04-19 NOTE — Telephone Encounter (Signed)
Pt left a voicemail stating that she started coughing this weekend, and the cough became so severe that she has started choking, vomiting, and having trouble breathing.  The pt also mentioned that she had a recent chest CT.  I sent a message to Francene Finders and she is currently addressing the pt's needs.

## 2019-04-19 NOTE — ED Provider Notes (Signed)
[facility] EMERGENCY DEPARTMENT Provider Note   CSN: [id number] Arrival date & time: 04/19/19  1510     History   Chief Complaint Chief Complaint  Patient presents with  . Shortness of Breath    HPI Pamela Long is a 48 y.o. female.     HPI  This patient is an ill-appearing 48 year old female, she has a known history of metastatic breast cancer to her liver, she has a history of recent admission to the hospital during which time she was dehydrated and underwent recent paracentesis.  She was discharged on November 1, she had ultrasound-guided paracentesis on 6 November.  Of note the patient also had an x-ray that was performed on October 31 which showed a small stable left pleural effusion.  She reports that over the last several days she has had increasing amounts of shortness of breath which is just not seem to get any better.  She called the paramedics today because of worsening shortness of breath.  She was found to be hypoxic and very tachycardic up to 120 bpm with oxygen levels around 85%.  She is not on home oxygen.  She reports chronic bilateral lower extremity edema.  She is followed by oncology.  Interventions:  ERCP with hepatic duct stent placement most recently March 18, 2019 Malignant ascites, recurrent paracentesis Was given Zofran as well as Phenergan as well as a scopolamine patch for persistent nausea vomiting  She reports dehydrated mucous membranes, she reports dark-colored urine, she reports progressive weakness  04/10/19 - PE protocol CT IMPRESSION: 1. No pulmonary emboli. 2. Interval increase in moderate-sized bilateral pleural effusions, greater on the left. 3. Bilateral lower lobe compressive atelectasis, greater on the right. 4. Increased prominence of the interstitial markings in both lungs, compatible with interstitial pulmonary edema. 5. Progressive patent metastatic disease. 6. Interval mild intrahepatic biliary ductal dilatation in the  lateral segment left lobe of the liver compatible with partial obstruction by the metastases. 7. Moderate to large amount of ascites, significantly increased.  Past Medical History:  Diagnosis Date  . Anemia   . Anxiety   . Asthma   . Breast cancer ([place])    Left, 2017  . Complication of anesthesia   . Depression   . GERD (gastroesophageal reflux disease)   . Migraine   . OAB (overactive bladder)   . PONV (postoperative nausea and vomiting)   . Seasonal allergies     Patient Active Problem List   Diagnosis Date Noted  . Intractable nausea and vomiting 04/10/2019  . 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 RUQ abdominal pain 03/11/2019  . Colitis 01/28/2019  . Chemotherapy-induced neutropenia ([place])   . Dehydration   . Lactic acid acidosis   . Nausea vomiting and diarrhea   . Status post thoracentesis   . Palliative care by specialist   . Community acquired pneumonia of left lung   . Acute on chronic respiratory failure with hypoxia ([place]) 12/13/2018  . Sepsis due to undetermined organism ([place]) 12/13/2018  . Pleural effusion on left   . HCAP (healthcare-associated pneumonia) 12/12/2018  . Febrile neutropenia ([place]) 12/12/2018  . Hypokalemia 12/12/2018  . Hyponatremia 12/12/2018  . Severe protein-calorie malnutrition ([place]) 12/12/2018  . Sepsis ([place]) 12/12/2018  . Obstructive jaundice 09/21/2018  . Goals of care, counseling/discussion 09/17/2018  . Abdominal pain 09/09/2018  . RUQ pain 07/08/2018  . Metastatic breast cancer ([employer] [facility] [employer]) 07/02/2018  . Liver mass 06/27/2018  . GERD (gastroesophageal reflux disease) 04/02/2018  . Rectal bleeding 04/02/2018  .  Constipation 04/02/2018  . Acquired absence of left breast 08/01/2017  . OAB (overactive bladder) 07/31/2017  . Vitamin D deficiency 02/26/2017  . Prediabetes 02/26/2017  . HLD (hyperlipidemia) 02/26/2017  . Asthma 02/12/2017  . Depression 02/12/2017  . Migraines 02/12/2017  . Arthralgia 02/12/2017  . Sleep-disordered  breathing 02/12/2017  . Positive ANA (antinuclear antibody) 02/12/2017  . Angular cheilitis 02/12/2017  . Class 2 obesity due to excess calories without serious comorbidity with body mass index (BMI) of 35.0 to 35.9 in adult 02/12/2017  . Malignant neoplasm of central portion of left breast in female, estrogen receptor positive ([place]) 01/05/2017    Past Surgical History:  Procedure Laterality Date  . ABDOMINAL HYSTERECTOMY     breast cancer  . ANKLE RECONSTRUCTION Right 1989  . BILIARY STENT PLACEMENT N/A 09/22/2018   Procedure: BILIARY STENT PLACEMENT;  Surgeon: Rogene Houston, MD;  Location: [facility] SU[facility];  Service: Endoscopy;  Laterality: N/A;  . BILIARY STENT PLACEMENT N/A 03/16/2019   Procedure: BILIARY STENT EXCHANGE;  Surgeon: Rogene Houston, MD;  Location: [facility] ENDO SUITE;  Service: Endoscopy;  Laterality: N/A;  . BREAST SURGERY    . COLONOSCOPY WITH PROPOFOL N/A 08/06/2018   Dr. Oneida Alar: small internal hemorrhoids, moderate external hemorrhoids. rectal bleeding due to ?anal fissure vs hemorrhoids. nex tcs 10 years  . ERCP N/A 09/22/2018   Procedure: ENDOSCOPIC RETROGRADE CHOLANGIOPANCREATOGRAPHY (ERCP);  Surgeon: Rogene Houston, MD;  Location: [facility] END[facility];  Service: Endoscopy;  Laterality: N/A;  . ERCP N/A 03/16/2019   Procedure: ENDOSCOPIC RETROGRADE CHOLANGIOPANCREATOGRAPHY (ERCP);  Surgeon: Rogene Houston, MD;  Location: [facility];  Service: Endoscopy;  Laterality: N/A;  . FOREIGN BODY REMOVAL N/A 08/29/2017   from lip  . KNEE SURGERY Right 1989   bone spur  . MASTECTOMY  2017   bil mastectomies  . PORTACATH PLACEMENT Left 12/23/2018   Procedure: INSERTION PORT-A-CATH LEFT SUBCLAVIAN;  Surgeon: Aviva Signs, MD;  Location: [facility] ORS;  Service: General;  Laterality: Left;  . SINUSOTOMY       OB History    Gravida  1   Para  1   Term  1   Preterm      AB      Living        SAB      TAB      Ectopic      Multiple      Live Births                Home Medications    Prior to Admission medications   Medication Sig Start Date End Date Taking? Authorizing Provider  escitalopram (LEXAPRO) 20 MG tablet Take 1 tablet (20 mg total) by mouth daily. 02/12/17   Raylene Everts, MD  furosemide (LASIX) 20 MG tablet TAKE 1 TABLET BY MOUTH EVERY DAY 04/13/19   Lockamy, Randi L, NP-C  gabapentin (NEURONTIN) 300 MG capsule Take 1 capsule (300 mg total) by mouth at bedtime. 01/07/19   Derek Jack, MD  lubiprostone (AMITIZA) 8 MCG capsule Take 1 capsule (8 mcg total) by mouth 2 (two) times daily with a meal. 03/02/19   Mahala Menghini, PA-C  magnesium oxide (MAG-OX) 400 MG tablet TAKE 1 TABLET BY MOUTH EVERY DAY 12/21/18   Lockamy, Randi L, NP-C  metoprolol tartrate (LOPRESSOR) 25 MG tablet Take 0.5 tablets (12.5 mg total) by mouth 2 (two) times daily. 04/11/19 04/10/20  Barton Dubois, MD  nystatin-triamcinolone [facility] II) cream Apply 1 application topically  2 (two) times daily.  02/24/19   [provider]  oxyCODONE-acetaminophen (PERCOCET/ROXICET) 5-325 MG tablet Take 1 tablet by mouth every 4 (four) hours as needed for severe pain. 04/19/19   Lockamy, Randi L, NP-C  PACLitaxel (TAXOL IV) Inject into the vein every 14 (fourteen) days.     [provider]  pantoprazole (PROTONIX) 40 MG tablet TAKE 1 TABLET BY MOUTH EVERY DAY BEFORE BREAKFAST 02/18/19   Derek Jack, MD  potassium chloride (KLOR-CON) 10 MEQ tablet Take 10 mEq by mouth 2 (two) times daily.    [provider]  prochlorperazine (COMPAZINE) 10 MG tablet Take 1 tablet (10 mg total) by mouth every 6 (six) hours as needed for nausea or vomiting. 04/08/19   Derek Jack, MD  promethazine (PHENERGAN) 25 MG tablet Take 1 tablet (25 mg total) by mouth every 8 (eight) hours as needed for refractory nausea / vomiting. 04/15/19   Derek Jack, MD  scopolamine (TRANSDERM-SCOP) 1 MG/3DAYS Place 1 patch (1.5 mg total) onto the skin every 3 (three) days.  01/18/19   Derek Jack, MD  spironolactone (ALDACTONE) 25 MG tablet TAKE 1 TABLET BY MOUTH EVERY DAY 04/13/19   Lockamy, Randi L, NP-C  temazepam (RESTORIL) 15 MG capsule TAKE 1 CAPSULE (15 MG TOTAL) BY MOUTH AT BEDTIME AS NEEDED FOR SLEEP. Patient taking differently: Take 15 mg by mouth at bedtime.  02/22/19   Derek Jack, MD    Family History Family History  Problem Relation Age of Onset  . Arthritis Mother   . COPD Mother   . Depression Mother   . Diabetes Mother   . Kidney disease Father   . Heart disease Father 80  . Drug abuse Father   . Hypertension Father   . Diabetes Daughter   . Hashimoto's thyroiditis Daughter   . Irritable bowel syndrome Daughter   . Autism spectrum disorder Daughter   . Early death Maternal Grandmother        drowned  . Early death Maternal Grandfather   . Heart disease Maternal Grandfather   . Heart disease Paternal Grandfather   . Breast cancer Maternal Aunt   . Breast cancer Cousin   . Lung cancer Maternal Aunt   . Lung cancer Maternal Uncle   . Colon cancer Neg Hx     Social History Social History   Tobacco Use  . Smoking status: Never Smoker  . Smokeless tobacco: Never Used  Substance Use Topics  . Alcohol use: No  . Drug use: No     Allergies   Salagen [pilocarpine], Tape, Amoxicillin, Caffeine, and Tetanus toxoids   Review of Systems Review of Systems  All other systems reviewed and are negative.    Physical Exam Updated Vital Signs There were no vitals taken for this visit.  Physical Exam Vitals signs and nursing note reviewed.  Constitutional:      General: She is not in acute distress.    Appearance: She is ill-appearing.  HENT:     Head: Normocephalic and atraumatic.     Mouth/Throat:     Pharynx: No oropharyngeal exudate.  Eyes:     General: No scleral icterus.       Right eye: No discharge.        Left eye: No discharge.     Conjunctiva/sclera: Conjunctivae normal.     Pupils: Pupils are  equal, round, and reactive to light.  Neck:     Musculoskeletal: Normal range of motion and neck supple.  Thyroid: No thyromegaly.     Vascular: No JVD.  Cardiovascular:     Rate and Rhythm: Regular rhythm. Tachycardia present.     Heart sounds: Normal heart sounds. No murmur. No friction rub. No gallop.   Pulmonary:     Effort: Respiratory distress present.     Breath sounds: No wheezing or rales.     Comments: Decreased breath sounds at the bases bilaterally, dullness to percussion from the mid lung down both sides Abdominal:     General: Bowel sounds are normal. There is no distension.     Palpations: Abdomen is soft. There is no mass.     Tenderness: There is abdominal tenderness (  Mild swelling with mild tenderness, no guarding or peritoneal signs).  Musculoskeletal: Normal range of motion.        General: No tenderness.     Right lower leg: Edema present.     Left lower leg: Edema present.  Lymphadenopathy:     Cervical: No cervical adenopathy.  Skin:    General: Skin is warm and dry.     Findings: No erythema or rash.  Neurological:     Mental Status: She is alert.     Coordination: Coordination normal.     Comments: Awake alert and following commands, she appears ill generally but is able to perform everything I asked her to do.  There is no focality to her generalized weakness  Psychiatric:        Behavior: Behavior normal.      ED Treatments / Results  Labs (all labs ordered are listed, but only abnormal results are displayed) Labs Reviewed  CBC WITH DIFFERENTIAL/PLATELET - Abnormal; Notable for the following components:      Result Value   Hemoglobin 11.6 (*)    HCT 35.4 (*)    RDW 21.1 (*)    Neutro Abs 8.2 (*)    Monocytes Absolute 1.1 (*)    All other components within normal limits  COMPREHENSIVE METABOLIC PANEL - Abnormal; Notable for the following components:   Sodium 131 (*)    Potassium 2.9 (*)    Chloride 96 (*)    Glucose, Bld 112 (*)     Calcium 8.3 (*)    Total Protein 6.0 (*)    Albumin 2.4 (*)    AST 170 (*)    ALT 62 (*)    Alkaline Phosphatase 449 (*)    Total Bilirubin 4.3 (*)    All other components within normal limits  SARS CORONAVIRUS 2 (TAT 6-24 HRS)  LIPASE, BLOOD  BRAIN NATRIURETIC PEPTIDE  LACTIC ACID, PLASMA  MAGNESIUM  TROPONIN I (HIGH SENSITIVITY)  TROPONIN I (HIGH SENSITIVITY)     Radiology Dg Chest Port 1 View  Result Date: 04/19/2019 CLINICAL DATA:  48 year old female with shortness of breath for 1 week. Productive cough. Undergoing chemotherapy for breast cancer. EXAM: PORTABLE CHEST 1 VIEW COMPARISON:  04/10/2019 and earlier. FINDINGS: Portable AP upright view at 1620 hours. Stable left chest Port-A-Cath. Continued bibasilar opacity which on CT the same day as prior was related to moderate to large bilateral pleural effusions. No superimposed pneumothorax. Pulmonary vascularity has decreased. Mild nonspecific patchy right upper lobe opacity now suspected at the anterior 2nd rib level. No other confluent opacity. Stable cardiac size and mediastinal contours. Stable visible bowel gas pattern. Ascites present before. Metal CBD stent redemonstrated. Stable visualized osseous structures. IMPRESSION: 1. Suspect ongoing bilateral pleural effusions although pulmonary vascularity has decreased from prior. 2. Patchy nonspecific right  upper lobe opacity. Pulmonary infection is possible. 3. Stable radiographic appearance of the upper abdomen. Electronically Signed   By: Genevie Ann M.D.   On: 04/19/2019 16:41    Procedures .Critical Care Performed by: Noemi Chapel, MD Authorized by: Noemi Chapel, MD   Critical care provider statement:    Critical care time (minutes):  35   Critical care time was exclusive of:  Separately billable procedures and treating other patients and teaching time   Critical care was necessary to treat or prevent imminent or life-threatening deterioration of the following conditions:   Respiratory failure   Critical care was time spent personally by me on the following activities:  Blood draw for specimens, development of treatment plan with patient or surrogate, discussions with consultants, evaluation of patient's response to treatment, examination of patient, obtaining history from patient or surrogate, ordering and performing treatments and interventions, ordering and review of laboratory studies, ordering and review of radiographic studies, pulse oximetry, re-evaluation of patient's condition and review of old charts   (including critical care time)  Medications Ordered in ED Medications  levofloxacin (LEVAQUIN) IVPB 750 mg (has no administration in time range)  potassium chloride 10 mEq in 100 mL IVPB (has no administration in time range)  magnesium sulfate IVPB 2 g 50 mL (2 g Intravenous New Bag/Given 04/19/19 1728)  oxyCODONE-acetaminophen (PERCOCET/ROXICET) 5-325 MG per tablet 1 tablet (1 tablet Oral Given 04/19/19 1734)  furosemide (LASIX) injection 40 mg (40 mg Intravenous Given 04/19/19 1724)     Initial Impression / Assessment and Plan / ED Course  I have reviewed the triage vital signs and the nursing notes.  Pertinent labs & imaging results that were available during my care of the patient were reviewed by me and considered in my medical decision making (see chart for details).  Clinical Course as of Apr 18 1737  Mon Apr 19, 2019  1649 X-ray does reveal that the patient has a patchy right upper lobe process, this could be related to an infiltrate.  This was not seen on prior CT scan   [BM]  1658 Additionally the bilirubin is trending up now to 4.3, there was known liver metastases which seem to be causing some biliary obstruction, this is likely worsening.  She is hyponatremic, hypokalemic and requiring antibiotics for possible new pneumonia.   [BM]  [id number] Paged hospitalist for admission   [BM]    Clinical Course User Index [BM] Noemi Chapel, MD        This patient is ill-appearing, she appears to have some degree of fluid overload, she does not have tight ascites and I do not think she needs paracentesis.  She may have increasing pleural effusions or other pulmonary pathology causing her hypoxia which is 86% on room air, she will need a chest x-ray and potentially work-up for CT pulmonary embolism or pleural effusions.  I suspect she will need to be admitted to the hospital.  EKG performed on April 19, 2019 at 4:37 PM shows sinus tachycardia with a rate of 119, there is rightward axis, poor R wave progression, low voltage, nonspecific T wave abnormalities, overall unchanged from prior EKG  Critically ill.  Final Clinical Impressions(s) / ED Diagnoses   Final diagnoses:  Respiratory distress  Hyponatremia  Hypokalemia  Pulmonary infiltrate    ED Discharge Orders    None       Noemi Chapel, MD 04/19/19 1740

## 2019-04-19 NOTE — Progress Notes (Signed)
CRITICAL VALUE ALERT  Critical Value:  Lactic Acid 2.2  Date & Time Notied:  04/19/2019 2205  Provider Notified: Dr. Olevia Bowens  Orders Received/Actions taken: see new orders.

## 2019-04-19 NOTE — Plan of Care (Signed)
  Problem: Activity: Goal: Ability to tolerate increased activity will improve Outcome: Progressing   Problem: Clinical Measurements: Goal: Ability to maintain a body temperature in the normal range will improve Outcome: Progressing   Problem: Respiratory: Goal: Ability to maintain adequate ventilation will improve Outcome: Progressing Goal: Ability to maintain a clear airway will improve Outcome: Progressing   

## 2019-04-19 NOTE — ED Notes (Signed)
CRITICAL VALUE ALERT  Critical Value:  Lactic acid 2.0  Date & Time Notied:  1755  Provider Notified: miller  Orders Received/Actions taken:

## 2019-04-20 ENCOUNTER — Other Ambulatory Visit: Payer: Self-pay

## 2019-04-20 LAB — CBC
HCT: 33.5 % — ABNORMAL LOW (ref 36.0–46.0)
Hemoglobin: 10.7 g/dL — ABNORMAL LOW (ref 12.0–15.0)
MCH: 25.8 pg — ABNORMAL LOW (ref 26.0–34.0)
MCHC: 31.9 g/dL (ref 30.0–36.0)
MCV: 80.9 fL (ref 80.0–100.0)
Platelets: 187 10*3/uL (ref 150–400)
RBC: 4.14 MIL/uL (ref 3.87–5.11)
RDW: 20.9 % — ABNORMAL HIGH (ref 11.5–15.5)
WBC: 9.3 10*3/uL (ref 4.0–10.5)
nRBC: 0 % (ref 0.0–0.2)

## 2019-04-20 LAB — SARS CORONAVIRUS 2 (TAT 6-24 HRS): SARS Coronavirus 2: NEGATIVE

## 2019-04-20 LAB — HEPATIC FUNCTION PANEL
ALT: 52 U/L — ABNORMAL HIGH (ref 0–44)
AST: 144 U/L — ABNORMAL HIGH (ref 15–41)
Albumin: 2.1 g/dL — ABNORMAL LOW (ref 3.5–5.0)
Alkaline Phosphatase: 393 U/L — ABNORMAL HIGH (ref 38–126)
Bilirubin, Direct: 1.9 mg/dL — ABNORMAL HIGH (ref 0.0–0.2)
Indirect Bilirubin: 1.8 mg/dL — ABNORMAL HIGH (ref 0.3–0.9)
Total Bilirubin: 3.7 mg/dL — ABNORMAL HIGH (ref 0.3–1.2)
Total Protein: 5.6 g/dL — ABNORMAL LOW (ref 6.5–8.1)

## 2019-04-20 LAB — BASIC METABOLIC PANEL
Anion gap: 11 (ref 5–15)
BUN: 7 mg/dL (ref 6–20)
CO2: 27 mmol/L (ref 22–32)
Calcium: 8.1 mg/dL — ABNORMAL LOW (ref 8.9–10.3)
Chloride: 97 mmol/L — ABNORMAL LOW (ref 98–111)
Creatinine, Ser: 0.53 mg/dL (ref 0.44–1.00)
GFR calc Af Amer: >60 mL/min (ref >60)
GFR calc non Af Amer: >60 mL/min (ref >60)
Glucose, Bld: 85 mg/dL (ref 70–99)
Potassium: 3.1 mmol/L — ABNORMAL LOW (ref 3.5–5.1)
Sodium: 135 mmol/L (ref 135–145)

## 2019-04-20 LAB — MAGNESIUM: Magnesium: 2.1 mg/dL (ref 1.7–2.4)

## 2019-04-20 MED ORDER — LUBIPROSTONE 8 MCG PO CAPS
8.0000 ug | ORAL_CAPSULE | Freq: Two times a day (BID) | ORAL | Status: DC
  Administered 2019-04-20 – 2019-05-01 (×18): 8 ug via ORAL
  Filled 2019-04-20 (×26): qty 1

## 2019-04-20 MED ORDER — METOPROLOL TARTRATE 25 MG PO TABS
12.5000 mg | ORAL_TABLET | Freq: Two times a day (BID) | ORAL | Status: DC
  Administered 2019-04-20 – 2019-05-03 (×21): 12.5 mg via ORAL
  Filled 2019-04-20 (×26): qty 1

## 2019-04-20 MED ORDER — GABAPENTIN 300 MG PO CAPS
300.0000 mg | ORAL_CAPSULE | Freq: Every day | ORAL | Status: DC
  Administered 2019-04-20 – 2019-05-02 (×10): 300 mg via ORAL
  Filled 2019-04-20 (×13): qty 1

## 2019-04-20 MED ORDER — BOOST / RESOURCE BREEZE PO LIQD CUSTOM
1.0000 | Freq: Three times a day (TID) | ORAL | Status: DC
  Administered 2019-04-20 – 2019-05-03 (×11): 1 via ORAL

## 2019-04-20 MED ORDER — LACTATED RINGERS IV SOLN
INTRAVENOUS | Status: DC
  Administered 2019-04-20: 14:00:00 via INTRAVENOUS

## 2019-04-20 MED ORDER — ALUM & MAG HYDROXIDE-SIMETH 200-200-20 MG/5ML PO SUSP
30.0000 mL | ORAL | Status: DC | PRN
  Administered 2019-04-20 – 2019-05-01 (×6): 30 mL via ORAL
  Filled 2019-04-20 (×7): qty 30

## 2019-04-20 MED ORDER — MORPHINE SULFATE (PF) 2 MG/ML IV SOLN
2.0000 mg | INTRAVENOUS | Status: DC | PRN
  Administered 2019-04-20 – 2019-05-03 (×43): 2 mg via INTRAVENOUS
  Filled 2019-04-20 (×47): qty 1

## 2019-04-20 MED ORDER — POTASSIUM CHLORIDE CRYS ER 20 MEQ PO TBCR
40.0000 meq | EXTENDED_RELEASE_TABLET | Freq: Two times a day (BID) | ORAL | Status: AC
  Administered 2019-04-20 (×2): 40 meq via ORAL
  Filled 2019-04-20 (×2): qty 2

## 2019-04-20 NOTE — Plan of Care (Signed)
  Problem: Activity: Goal: Ability to tolerate increased activity will improve Outcome: Progressing   Problem: Clinical Measurements: Goal: Ability to maintain a body temperature in the normal range will improve Outcome: Progressing   Problem: Respiratory: Goal: Ability to maintain adequate ventilation will improve Outcome: Progressing Goal: Ability to maintain a clear airway will improve Outcome: Progressing   

## 2019-04-20 NOTE — Progress Notes (Signed)
PROGRESS NOTE    Pamela Long  [id number] DOB: [birth year] DOA: 04/19/2019 PCP: Doree Albee, MD   Brief Narrative:  Per HPI: Pamela Long is a 48 y.o. female with medical history significant for metastatic breast cancer, depression, asthma.  Patient presented to the ED with complaints of worsening cough productive of whitish sputum and difficulty breathing over the past 1 week.  She reports persistent pleuritic chest pain from recent hospitalization and is worse with coughing and difficulty breathing.  No known Covid positive contacts.  Chronic mild unchanged lower extremity swelling slightly worse on the left.  Reports poor p.o. intake with nausea and occasional vomiting.  No loose stools.  Recent hospitalization 10/31 - 11/1, for intractable nausea and vomiting with generalized weakness, thought secondary to chemo treatment and ongoing metastatic malignancy.  Had CTA chest done for pleuritic chest discomfort and tachycardia which was negative for pulmonary embolism.  11/10: Patient was admitted with acute hypoxemic respiratory failure secondary to pneumonia and has been started on IV Levaquin.  She is noted to have metastatic breast cancer with metastatic lesions to her liver and elevated liver enzymes.  She has had prior ERCP with stent.  Assessment & Plan:   Principal Problem:   Pneumonia Active Problems:   Asthma   Depression   Metastatic breast cancer ([place])   Acute hypoxemic respiratory failure secondary to community-acquired pneumonia -Continue on Levaquin as ordered and follow-up repeat labs and monitor cultures -Mucolytic's and albuterol as needed for symptomatic management -Lactic acid remains elevated, will trend in a.m. and maintain on IV fluid-time-limited -Covid testing negative -Wean oxygen as tolerated  Transaminitis -This is a chronic issue related to hepatic metastatic disease -Continue to trend -No active GI issues currently noted  Metastatic  breast cancer -Follow-up with Dr. [name] in outpatient setting -Currently on chemotherapy with paclitaxel 03/25/2019-currently on hold due to elevated bilirubin -Consult oncology as needed  Hypokalemia -Continue repletion and monitor labs in a.m.  History of asthma-stable -As needed breathing treatments  History of depression -Temazepam as needed  Ongoing tachycardia-chronic -Recent PE evaluation with CT angiogram which was negative for PE -EKG on this admission unchanged -Resume home metoprolol 12.5 mg twice daily for now as blood pressures are stable and she is noted to be tachycardic   DVT prophylaxis: Lovenox Code Status: Full Family Communication: None at bedside Disposition Plan: Continue pneumonia treatment and trend labs.  Anticipate discharge with home with home health and follow-up with oncology in the outpatient setting.   Consultants:   None, canceled consultation to oncology  Procedures:   None  Antimicrobials:  Anti-infectives (From admission, onward)   Start     Dose/Rate Route Frequency Ordered Stop   04/20/19 1800  levofloxacin (LEVAQUIN) IVPB 750 mg     750 mg 100 mL/hr over 90 Minutes Intravenous Every 24 hours 04/19/19 1856     04/19/19 1845  levofloxacin (LEVAQUIN) IVPB 750 mg     750 mg 100 mL/hr over 90 Minutes Intravenous  Once 04/19/19 1843 04/19/19 2208   04/19/19 1700  levofloxacin (LEVAQUIN) IVPB 750 mg  Status:  Discontinued     750 mg 100 mL/hr over 90 Minutes Intravenous  Once 04/19/19 1649 04/19/19 2104       Subjective: Patient seen and evaluated today with no new acute complaints or concerns. No acute concerns or events noted overnight.  She continues to have ongoing cough and shortness of breath  Objective: Vitals:   04/19/19 1852 04/19/19 2111 04/20/19 [id number]  04/20/19 1200  BP: (!) 124/94 (!) 138/93 124/61 127/79  Pulse: (!) 116 (!) 121 (!) 116 (!) 110  Resp: (!) 22 16 16 20   Temp: 98.4 F (36.9 C) 98.2 F (36.8 C)  98.2 F (36.8 C) 98.4 F (36.9 C)  TempSrc: Oral Oral Oral Oral  SpO2: 97% 94% 96% 95%  Weight:      Height:        Intake/Output Summary (Last 24 hours) at 04/20/2019 1313 Last data filed at 04/20/2019 0300 Gross per 24 hour  Intake 656.23 ml  Output -  Net 656.23 ml   Filed Weights   04/19/19 1521  Weight: 73.9 kg    Examination:  General exam: Appears calm and comfortable  Respiratory system: Clear to auscultation. Respiratory effort normal.  Currently on 2 L nasal cannula oxygen. Cardiovascular system: S1 & S2 heard, RRR. No JVD, murmurs, rubs, gallops or clicks. No pedal edema. Gastrointestinal system: Abdomen is nondistended, soft and nontender. No organomegaly or masses felt. Normal bowel sounds heard. Central nervous system: Alert and oriented. No focal neurological deficits. Extremities: Symmetric 5 x 5 power. Skin: No rashes, lesions or ulcers Psychiatry: Judgement and insight appear normal. Mood & affect appropriate.     Data Reviewed: I have personally reviewed following labs and imaging studies  CBC: Recent Labs  Lab 04/19/19 1533 04/20/19 0444  WBC 10.1 9.3  NEUTROABS 8.2*  --   HGB 11.6* 10.7*  HCT 35.4* 33.5*  MCV 80.3 80.9  PLT 183 [id number]   Basic Metabolic Panel: Recent Labs  Lab 04/19/19 1533 04/20/19 0444  NA 131* 135  K 2.9* 3.1*  CL 96* 97*  CO2 25 27  GLUCOSE 112* 85  BUN 7 7  CREATININE 0.52 0.53  CALCIUM 8.3* 8.1*  MG 1.5* 2.1   GFR: Estimated Creatinine Clearance: 84.7 mL/min (by C-G formula based on SCr of 0.53 mg/dL). Liver Function Tests: Recent Labs  Lab 04/19/19 1533 04/20/19 0444  AST 170* 144*  ALT 62* 52*  ALKPHOS 449* 393*  BILITOT 4.3* 3.7*  PROT 6.0* 5.6*  ALBUMIN 2.4* 2.1*   Recent Labs  Lab 04/19/19 1533  LIPASE 15   No results for input(s): AMMONIA in the last 168 hours. Coagulation Profile: No results for input(s): INR, PROTIME in the last 168 hours. Cardiac Enzymes: No results for input(s):  CKTOTAL, CKMB, CKMBINDEX, TROPONINI in the last 168 hours. BNP (last 3 results) No results for input(s): PROBNP in the last 8760 hours. HbA1C: No results for input(s): HGBA1C in the last 72 hours. CBG: No results for input(s): GLUCAP in the last 168 hours. Lipid Profile: No results for input(s): CHOL, HDL, LDLCALC, TRIG, CHOLHDL, LDLDIRECT in the last 72 hours. Thyroid Function Tests: No results for input(s): TSH, T4TOTAL, FREET4, T3FREE, THYROIDAB in the last 72 hours. Anemia Panel: No results for input(s): VITAMINB12, FOLATE, FERRITIN, TIBC, IRON, RETICCTPCT in the last 72 hours. Sepsis Labs: Recent Labs  Lab 04/19/19 1708 04/19/19 2042  LATICACIDVEN 2.0* 2.2*    Recent Results (from the past 240 hour(s))  SARS CORONAVIRUS 2 (TAT 6-24 HRS) Nasopharyngeal Nasopharyngeal Swab     Status: None   Collection Time: 04/19/19  4:51 PM   Specimen: Nasopharyngeal Swab  Result Value Ref Range Status   SARS Coronavirus 2 NEGATIVE NEGATIVE Final    Comment: (NOTE) SARS-CoV-2 target nucleic acids are NOT DETECTED. The SARS-CoV-2 RNA is generally detectable in upper and lower respiratory specimens during the acute phase of infection. Negative results do not  preclude SARS-CoV-2 infection, do not rule out co-infections with other pathogens, and should not be used as the sole basis for treatment or other patient management decisions. Negative results must be combined with clinical observations, patient history, and epidemiological information. The expected result is Negative. Fact Sheet for Patients: SugarRoll.be Fact Sheet for Healthcare Providers: https://www.woods-mathews.com/ This test is not yet approved or cleared by the [place] FDA and  has been authorized for detection and/or diagnosis of SARS-CoV-2 by FDA under an Emergency Use Authorization (EUA). This EUA will remain  in effect (meaning this test can be used) for the duration of the  COVID-19 declaration under Section 56 4(b)(1) of the Act, 21 U.S.C. section 360bbb-3(b)(1), unless the authorization is terminated or revoked sooner. Performed at [facility], [place] [address], [place], [place] [address]          Radiology Studies: Dg Chest [place] 1 View  Result Date: 04/19/2019 CLINICAL DATA:  48 year old female with shortness of breath for 1 week. Productive cough. Undergoing chemotherapy for breast cancer. EXAM: PORTABLE CHEST 1 VIEW COMPARISON:  04/10/2019 and earlier. FINDINGS: Portable AP upright view at 1620 hours. Stable left chest Port-A-Cath. Continued bibasilar opacity which on CT the same day as prior was related to moderate to large bilateral pleural effusions. No superimposed pneumothorax. Pulmonary vascularity has decreased. Mild nonspecific patchy right upper lobe opacity now suspected at the anterior 2nd rib level. No other confluent opacity. Stable cardiac size and mediastinal contours. Stable visible bowel gas pattern. Ascites present before. Metal CBD stent redemonstrated. Stable visualized osseous structures. IMPRESSION: 1. Suspect ongoing bilateral pleural effusions although pulmonary vascularity has decreased from prior. 2. Patchy nonspecific right upper lobe opacity. Pulmonary infection is possible. 3. Stable radiographic appearance of the upper abdomen. Electronically Signed   By: Genevie Ann M.D.   On: 04/19/2019 16:41        Scheduled Meds: . enoxaparin (LOVENOX) injection  40 mg Subcutaneous Q24H  . feeding supplement  1 Container Oral TID BM  . gabapentin  300 mg Oral QHS  . guaiFENesin-dextromethorphan  10 mL Oral Q8H  . lubiprostone  8 mcg Oral BID WC  . pantoprazole  40 mg Oral q morning - 10a  . potassium chloride  40 mEq Oral BID   Continuous Infusions: . levofloxacin (LEVAQUIN) IV       LOS: 1 day    Time spent: 30 minutes    Dajohn Ellender Darleen Crocker, DO [facility] Pager [phone]  If 7PM-7AM, please contact  night-coverage [email or web address] Password [email or web address] 04/20/2019, 1:13 PM

## 2019-04-20 NOTE — Progress Notes (Signed)
Initial Nutrition Assessment  DOCUMENTATION CODES:   Severe malnutrition in context of chronic illness  INTERVENTION:  -Provided "Tips for Managing Nausea and Vomiting" handout from AND -Educated on meal frequency and maximizing calorie/protein intake -Boost Breeze po TID, each supplement provides 250 kcal and 9 grams of protein  -Provided coupons for Boost supplements -D/c Ensure (pt does not like)  NUTRITION DIAGNOSIS:   Severe Malnutrition related to cancer and cancer related treatments(breast cancer with mets to liver; last chemotherapy 10/15) as evidenced by percent weight loss, moderate fat depletion, severe fat depletion, moderate muscle depletion, severe muscle depletion, edema, energy intake < or equal to 75% for > or equal to 1 month, per patient/family report.  GOAL:   Patient will meet greater than or equal to 90% of their needs  MONITOR:   PO intake, Supplement acceptance, I & O's, Labs, Weight trends  REASON FOR ASSESSMENT:   Malnutrition Screening Tool    ASSESSMENT:  48 year old female with past medical history significant for metastatic breast cancer s/p bilateral mastectomy, depression, asthma, GERD, recent hospitalization 10/31-11/1 for n/v with weakness thought secondary to chemo treatment who presented to ED with complaints of worsening cough productive of whitish sputum and difficulty breathing over the past week. She reports persistent pleuritic chest pain from recent hospitalization, unchanged lower extremity swelling and poor po intake with nausea and occasional vomiting. Portable CXR showed suspected ongoing bilateral pleural effusion  Patient admitted for pneumonia with acute respiratory failure.   11/6 paracentesis for malignant ascites - 4.9 L removed 10/15 Paclitaxel (Day 1;Cycle 5) - breast cancer; mets to liver Patient reports improvements to cough and decreased SOB symptoms today at visit. She endorses eggs, bacon, and half of her pancakes stating  that adding syrup was not a good idea and did not care for the sweet taste. When at home patient reports that she has experienced decreased appetite/intake for the past 6 months related to nausea; recalls taking a bite of a hot dog last week and immediately spitting it out. RD educated patient on consuming meals that were easy to digest, avoiding greasy, high fat foods. Suggested room temperature or cold foods (cold cereals, cottage cheese with soft fruit, hard boiled eggs, sandwiches, plain noodles, rice, potatoes) and provided "Tips for Nausea and Vomiting" educational handout. Encouraged use of nutrition supplement, patient reports that she does not like the after taste of Ensure/Boost. Patient amenable to trying Boost Gwyneth Revels, RD provided patient with coupons.   Patient endorses continued weight losses, recalling UBW 220 lbs prior to cancer diagnosis; 175-180 lbs more recently. Per review of weight history, patient has lost 23.5 lbs (12.6%) in the past 3 months which is severe for time frame. On 8/24 patient wt 84.6 kg (186.1 lbs), 9/22 wt 75.8 kg (166.8 lb), 10/26 wt 74.3 kg (163.5 lb)  Medications reviewed and include: Protonix, KCl 40 mEq tablet Levaquin Labs: K 3.1 (L), Ca corrects for low albumin 9.6 11/9 hyponatremia - resolved  NUTRITION - FOCUSED PHYSICAL EXAM: Severe fat depletion to orbital region; moderate fat depletion to upper arm; buccal regions. Moderate muscle depletions to temporal, clavicle and acromion bone regions, severe muscle depletions to clavicle and dorsal hand.    Diet Order:   Diet Order            Diet regular Room service appropriate? Yes; Fluid consistency: Thin  Diet effective now              EDUCATION NEEDS:   Education needs have  been addressed  Skin:  Skin Assessment: Reviewed RN Assessment(incision;closed left;abdomen)  Last BM:  Unknown  Height:   Ht Readings from Last 1 Encounters:  04/19/19 5\' 5"  (1.651 m)    Weight:   Wt Readings from  Last 1 Encounters:  04/19/19 73.9 kg    Ideal Body Weight:  56.8 kg  BMI:  Body mass index is 27.12 kg/m.  Estimated Nutritional Needs:   Kcal:  2215-2365  Protein:  111-119  Fluid:  >/= 2.2 L/day  Lajuan Lines, RD, LDN Clinical Nutrition Office [phone] After Hours/Weekend Pager: [phone]

## 2019-04-20 NOTE — TOC Initial Note (Signed)
Transition of Care [facility]) - Initial/Assessment Note    Patient Details  Name: Pamela Long MRN: [id number] Date of Birth: 1970-12-23  [facility] of [facility]) CM/SW Contact:    Shade Flood, LCSW Phone Number: 04/20/2019, 12:01 PM  Clinical Narrative:                 Patient admitted with pneumonia. Patient has high readmission score. Patient is seeing oncology and she lives at home with her daughter.  Patient has had HH from [facility] previously.   TOC will follow and assist as needed.  Expected Discharge Plan: [place] Barriers to Discharge: Continued Medical Work up   Patient Goals and CMS Choice        Expected Discharge Plan and Services Expected Discharge Plan: [place] In-house Referral: Clinical Social Work     Living arrangements for the past 2 months: [place]                                      Prior Living Arrangements/Services Living arrangements for the past 2 months: Single Family Home Lives with:: Adult Children Patient language and need for interpreter reviewed:: Yes Do you feel safe going back to the place where you live?: Yes      Need for Family Participation in Patient Care: Yes (Comment) Care giver support system in place?: Yes (comment) Current home services: DME Criminal Activity/Legal Involvement Pertinent to Current Situation/Hospitalization: No - Comment as needed  Activities of Daily Living Home Assistive Devices/Equipment: Eyeglasses, Environmental consultant (specify type), Cane (specify quad or straight), Shower chair with back ADL Screening (condition at time of admission) Patient's cognitive ability adequate to safely complete daily activities?: Yes Is the patient deaf or have difficulty hearing?: No Does the patient have difficulty seeing, even when wearing glasses/contacts?: Yes Does the patient have difficulty concentrating, remembering, or making decisions?: Yes(Due to chemo as reported  by patient) Patient able to express need for assistance with ADLs?: Yes Does the patient have difficulty dressing or bathing?: No Independently performs ADLs?: Yes (appropriate for developmental age) Does the patient have difficulty walking or climbing stairs?: Yes Weakness of Legs: Both Weakness of Arms/Hands: None  Permission Sought/Granted                  Emotional Assessment Appearance:: Appears stated age     Orientation: : Oriented to Self, Oriented to Place, Oriented to  Time, Oriented to Situation Alcohol / Substance Use: Not Applicable Psych Involvement: No (comment)  Admission diagnosis:  Hypokalemia [E87.6] Hyponatremia [E87.1] Pulmonary infiltrate [R91.8] Respiratory distress [R06.03] Patient Active Problem List   Diagnosis Date Noted  . Pneumonia 04/19/2019  . Intractable nausea and vomiting 04/10/2019  . 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 RUQ abdominal pain 03/11/2019  . Colitis 01/28/2019  . Chemotherapy-induced neutropenia ([place])   . Dehydration   . Lactic acid acidosis   . Nausea vomiting and diarrhea   . Status post thoracentesis   . Palliative care by specialist   . Community acquired pneumonia of left lung   . Acute on chronic respiratory failure with hypoxia ([place]) 12/13/2018  . Sepsis due to undetermined organism ([place]) 12/13/2018  . Pleural effusion on left   . HCAP (healthcare-associated pneumonia) 12/12/2018  . Febrile neutropenia ([place]) 12/12/2018  . Hypokalemia 12/12/2018  . Hyponatremia 12/12/2018  . Severe protein-calorie malnutrition ([place]) 12/12/2018  .  Sepsis ([place]) 12/12/2018  . Obstructive jaundice 09/21/2018  . Goals of care, counseling/discussion 09/17/2018  . Abdominal pain 09/09/2018  . RUQ pain 07/08/2018  . Metastatic breast cancer ([place]) 07/02/2018  . Liver mass 06/27/2018  . GERD (gastroesophageal reflux disease) 04/02/2018  . Rectal bleeding 04/02/2018  . Constipation 04/02/2018  . Acquired absence of left breast  08/01/2017  . OAB (overactive bladder) 07/31/2017  . Vitamin D deficiency 02/26/2017  . Prediabetes 02/26/2017  . HLD (hyperlipidemia) 02/26/2017  . Asthma 02/12/2017  . Depression 02/12/2017  . Migraines 02/12/2017  . Arthralgia 02/12/2017  . Sleep-disordered breathing 02/12/2017  . Positive ANA (antinuclear antibody) 02/12/2017  . Angular cheilitis 02/12/2017  . Class 2 obesity due to excess calories without serious comorbidity with body mass index (BMI) of 35.0 to 35.9 in adult 02/12/2017  . Malignant neoplasm of central portion of left breast in female, estrogen receptor positive ([place]) 01/05/2017   PCP:  Doree Albee, MD Pharmacy:   [id number] - [place], [address] [place] [address] Phone: [phone] Fax: [phone]     Social Determinants of Health (SDOH) Interventions    Readmission Risk Interventions Readmission Risk Prevention Plan 04/20/2019 02/01/2019 12/16/2018  Transportation Screening Complete Complete -  PCP or Specialist Appt within 3-5 Days - - Complete  HRI or Home Care Consult - - Complete  Social Work Consult for [place] Planning/Counseling - - Complete  Palliative Care Screening - - Complete  Medication Review Press photographer) Complete Complete Complete  PCP or Specialist appointment within 3-5 days of discharge - Not Complete -  [place] or [place] - Complete -  [place] or Home Care Consult Pt Refusal Comments - - -  SW Recovery Care/Counseling Consult - Complete -  Palliative Care Screening - Not Complete -  [place] Not Applicable Not Complete -

## 2019-04-21 ENCOUNTER — Inpatient Hospital Stay (HOSPITAL_COMMUNITY): Payer: 59

## 2019-04-21 ENCOUNTER — Ambulatory Visit (HOSPITAL_COMMUNITY): Payer: 59

## 2019-04-21 DIAGNOSIS — J910 Malignant pleural effusion: Secondary | ICD-10-CM | POA: Diagnosis present

## 2019-04-21 DIAGNOSIS — R188 Other ascites: Secondary | ICD-10-CM | POA: Diagnosis present

## 2019-04-21 DIAGNOSIS — J189 Pneumonia, unspecified organism: Principal | ICD-10-CM

## 2019-04-21 DIAGNOSIS — R180 Malignant ascites: Secondary | ICD-10-CM

## 2019-04-21 DIAGNOSIS — E871 Hypo-osmolality and hyponatremia: Secondary | ICD-10-CM

## 2019-04-21 DIAGNOSIS — R0603 Acute respiratory distress: Secondary | ICD-10-CM | POA: Diagnosis present

## 2019-04-21 DIAGNOSIS — J45909 Unspecified asthma, uncomplicated: Secondary | ICD-10-CM

## 2019-04-21 DIAGNOSIS — E876 Hypokalemia: Secondary | ICD-10-CM

## 2019-04-21 DIAGNOSIS — J181 Lobar pneumonia, unspecified organism: Secondary | ICD-10-CM

## 2019-04-21 DIAGNOSIS — F329 Major depressive disorder, single episode, unspecified: Secondary | ICD-10-CM

## 2019-04-21 DIAGNOSIS — C50919 Malignant neoplasm of unspecified site of unspecified female breast: Secondary | ICD-10-CM

## 2019-04-21 LAB — COMPREHENSIVE METABOLIC PANEL
ALT: 44 U/L (ref 0–44)
AST: 126 U/L — ABNORMAL HIGH (ref 15–41)
Albumin: 2 g/dL — ABNORMAL LOW (ref 3.5–5.0)
Alkaline Phosphatase: 383 U/L — ABNORMAL HIGH (ref 38–126)
Anion gap: 8 (ref 5–15)
BUN: 9 mg/dL (ref 6–20)
CO2: 27 mmol/L (ref 22–32)
Calcium: 8.3 mg/dL — ABNORMAL LOW (ref 8.9–10.3)
Chloride: 100 mmol/L (ref 98–111)
Creatinine, Ser: 0.52 mg/dL (ref 0.44–1.00)
GFR calc Af Amer: >60 mL/min (ref >60)
GFR calc non Af Amer: >60 mL/min (ref >60)
Glucose, Bld: 98 mg/dL (ref 70–99)
Potassium: 4 mmol/L (ref 3.5–5.1)
Sodium: 135 mmol/L (ref 135–145)
Total Bilirubin: 3.1 mg/dL — ABNORMAL HIGH (ref 0.3–1.2)
Total Protein: 5 g/dL — ABNORMAL LOW (ref 6.5–8.1)

## 2019-04-21 LAB — PROTEIN, PLEURAL OR PERITONEAL FLUID: Total protein, fluid: <3 g/dL

## 2019-04-21 LAB — BODY FLUID CELL COUNT WITH DIFFERENTIAL
Eos, Fluid: 0 %
Lymphs, Fluid: 18 %
Monocyte-Macrophage-Serous Fluid: 58 % (ref 50–90)
Neutrophil Count, Fluid: 24 % (ref 0–25)
Other Cells, Fluid: 1 %
Total Nucleated Cell Count, Fluid: 541 cu mm (ref 0–1000)

## 2019-04-21 LAB — GRAM STAIN: Gram Stain: NONE SEEN

## 2019-04-21 LAB — CBC
HCT: 34.1 % — ABNORMAL LOW (ref 36.0–46.0)
Hemoglobin: 10.8 g/dL — ABNORMAL LOW (ref 12.0–15.0)
MCH: 25.8 pg — ABNORMAL LOW (ref 26.0–34.0)
MCHC: 31.7 g/dL (ref 30.0–36.0)
MCV: 81.6 fL (ref 80.0–100.0)
Platelets: 190 10*3/uL (ref 150–400)
RBC: 4.18 MIL/uL (ref 3.87–5.11)
RDW: 21.8 % — ABNORMAL HIGH (ref 11.5–15.5)
WBC: 9.8 10*3/uL (ref 4.0–10.5)
nRBC: 0 % (ref 0.0–0.2)

## 2019-04-21 LAB — LACTATE DEHYDROGENASE, PLEURAL OR PERITONEAL FLUID: LD, Fluid: 316 U/L — ABNORMAL HIGH (ref 3–23)

## 2019-04-21 LAB — MAGNESIUM: Magnesium: 1.8 mg/dL (ref 1.7–2.4)

## 2019-04-21 LAB — LACTIC ACID, PLASMA: Lactic Acid, Venous: 1.6 mmol/L (ref 0.5–1.9)

## 2019-04-21 LAB — GLUCOSE, PLEURAL OR PERITONEAL FLUID: Glucose, Fluid: 115 mg/dL

## 2019-04-21 IMAGING — DX DG CHEST 1V
1 series · 1 of 1 positions shown · non-contrast
Comparison: [DATE]

CLINICAL DATA: LEFT pleural effusion post thoracentesis

EXAM:
CHEST  1 VIEW

[chest pa]
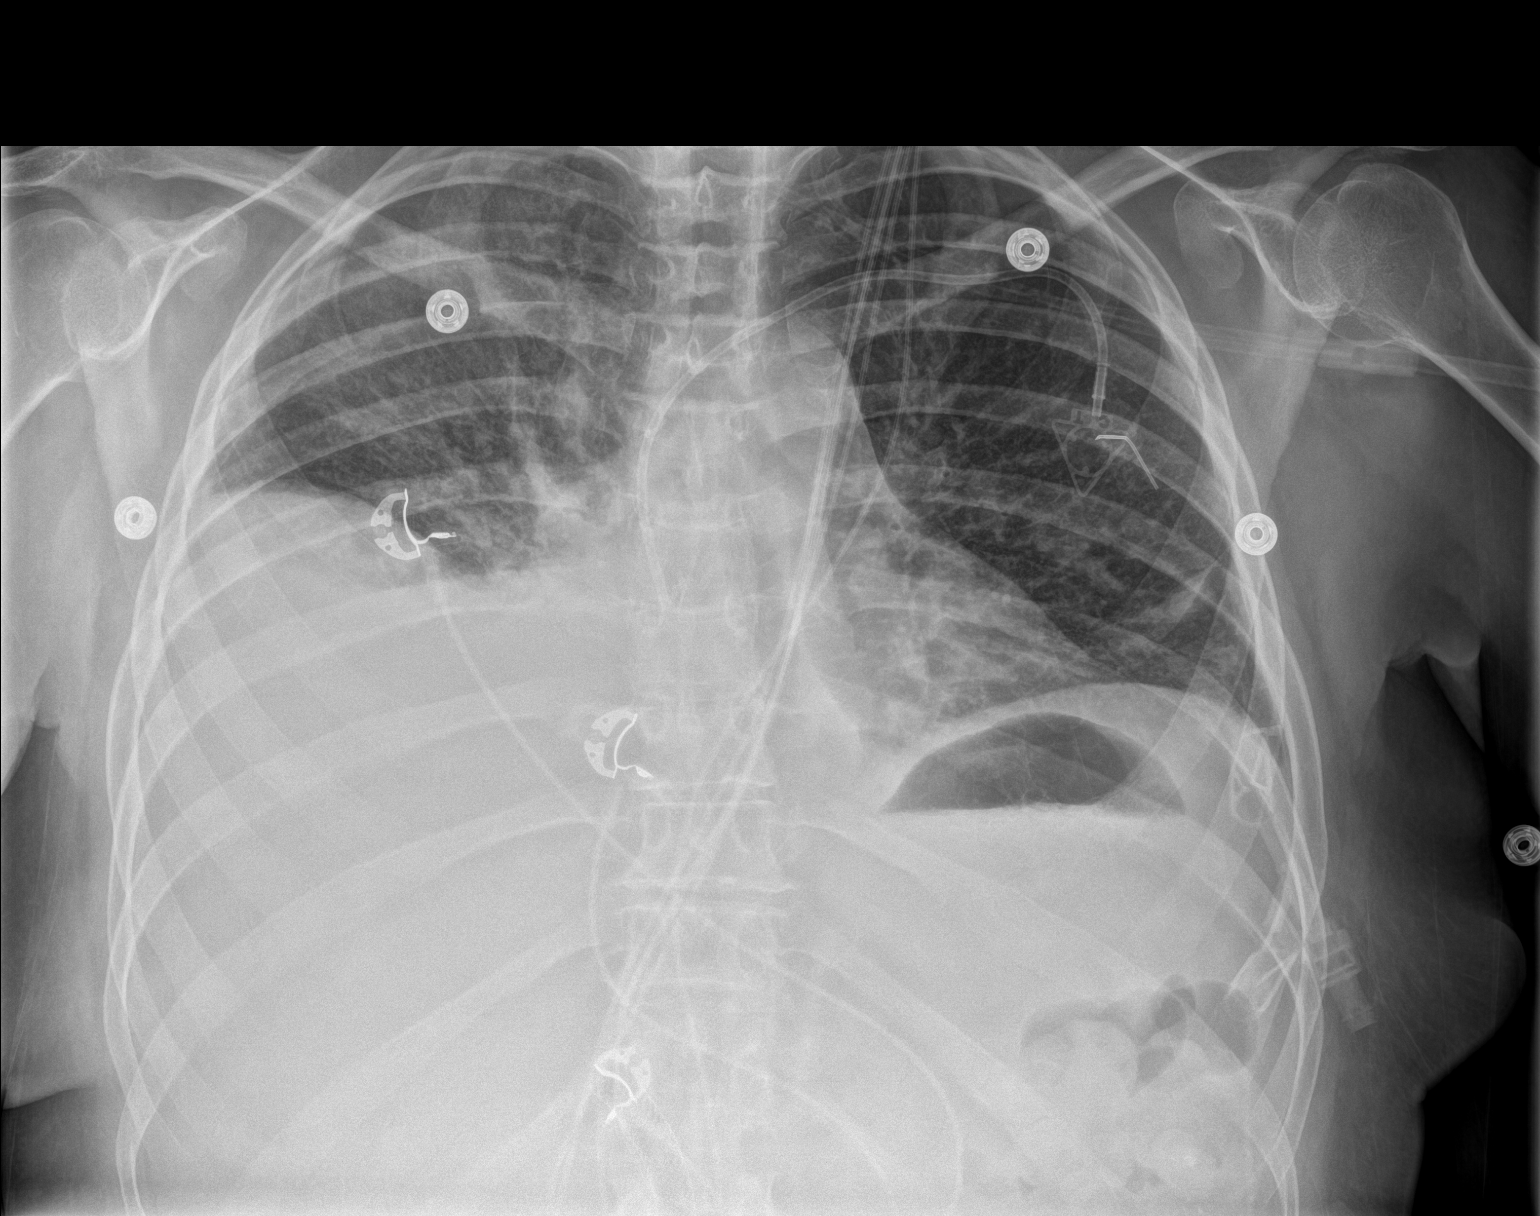

[1 of 1 positions shown; findings below may reference images not displayed]

FINDINGS: LEFT subclavian Port-A-Cath with tip projecting over cavoatrial
junction.

Stable heart size.

Chronic elevation of RIGHT diaphragm.

Bibasilar atelectasis accentuated by expiratory technique.

No significant residual LEFT pleural effusion identified.

No pneumothorax post thoracentesis.
IMPRESSION: No pneumothorax post LEFT thoracentesis.

## 2019-04-21 IMAGING — US US THORACENTESIS ASP PLEURAL SPACE W/IMG GUIDE
1 series · 2 of 2 positions shown · non-contrast
Comparison: none

INDICATION: LEFT pleural effusion, breast cancer

[Series 1: us thoracentesis asp pleural space w/img guide · 2 of 2 slices shown]
[im 1/2]
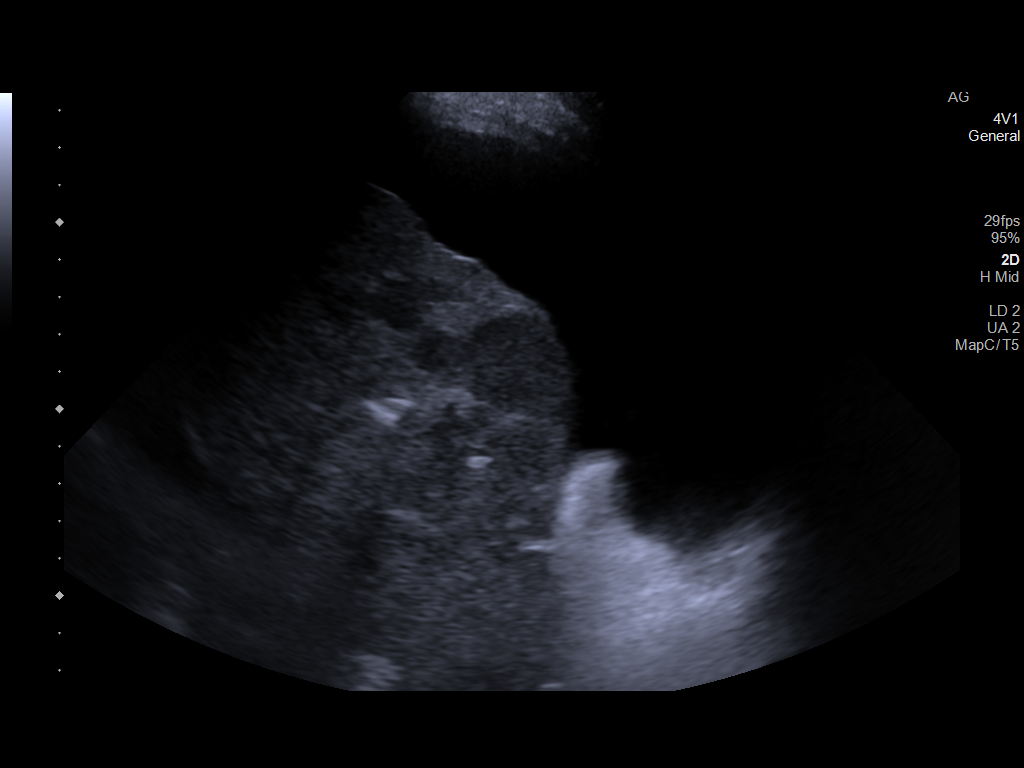
[im 2/2]
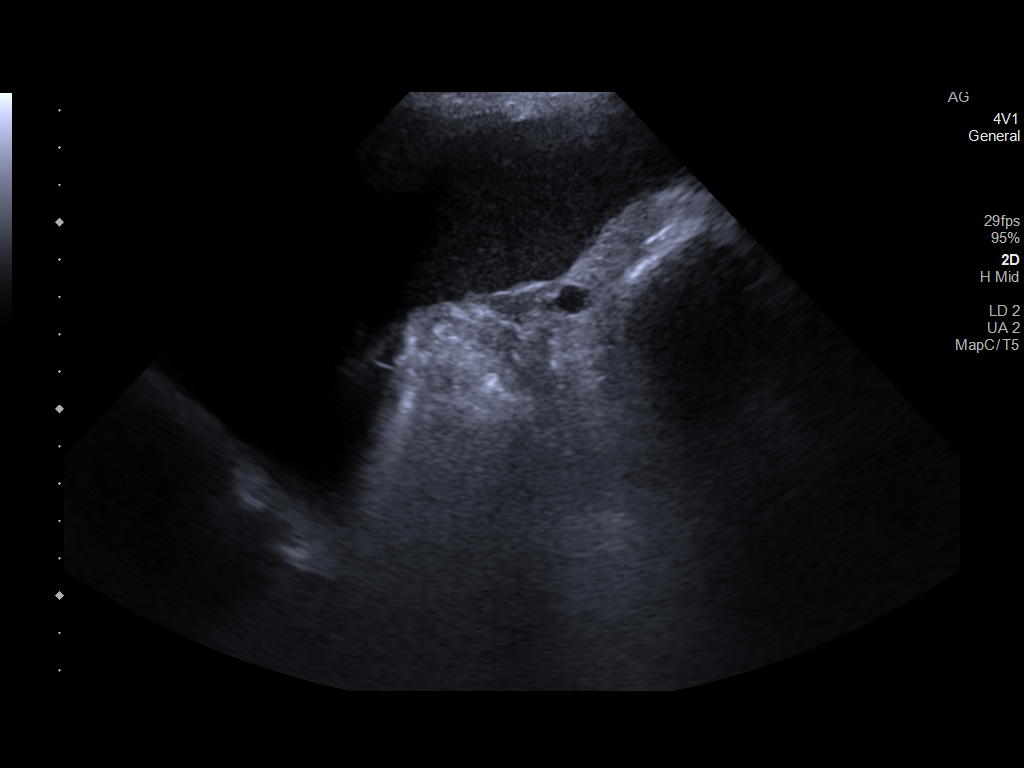

[2 of 2 positions shown; findings below may reference images not displayed]

EXAM:
ULTRASOUND GUIDED DIAGNOSTIC AND THERAPEUTIC THORACENTESIS

MEDICATIONS:
None.

COMPLICATIONS:
None immediate.

PROCEDURE:
Procedure, benefits, and risks of procedure were discussed with
patient.

Written informed consent for procedure was obtained.

Time out protocol followed.

Pleural effusion localized by ultrasound at the posterior LEFT
hemithorax.

Skin prepped and draped in usual sterile fashion.

Skin and soft tissues anesthetized with 10 mL of 1% lidocaine.

8 French thoracentesis catheter placed into the LEFT pleural space.

980 mL of yellow LEFT pleural fluid aspirated by syringe pump.

Procedure tolerated well by patient without immediate complication.
FINDINGS: A total of approximately 980 mL of LEFT pleural fluid was removed.

Samples were sent to the laboratory as requested by the clinical
team.
IMPRESSION: Successful ultrasound guided LEFT thoracentesis yielding 980 mL of
pleural fluid.

## 2019-04-21 MED ORDER — GUAIFENESIN-DM 100-10 MG/5ML PO SYRP
10.0000 mL | ORAL_SOLUTION | ORAL | Status: DC | PRN
  Administered 2019-04-22 – 2019-05-02 (×21): 10 mL via ORAL
  Filled 2019-04-21 (×26): qty 10

## 2019-04-21 MED ORDER — CHLORHEXIDINE GLUCONATE CLOTH 2 % EX PADS
6.0000 | MEDICATED_PAD | Freq: Every day | CUTANEOUS | Status: DC
  Administered 2019-04-21 – 2019-05-04 (×14): 6 via TOPICAL

## 2019-04-21 MED ORDER — MAGNESIUM SULFATE 2 GM/50ML IV SOLN
2.0000 g | Freq: Once | INTRAVENOUS | Status: AC
  Administered 2019-04-21: 2 g via INTRAVENOUS
  Filled 2019-04-21: qty 50

## 2019-04-21 NOTE — Consult Note (Signed)
[facility] Consultation Oncology  Name: Pamela Long      MRN: [id number]    Location: A305/A305-01  Date: 04/21/2019 Time:8:00 AM   REFERRING PHYSICIAN: Dr. Manuella Ghazi  REASON FOR CONSULT: Metastatic breast cancer to the liver   DIAGNOSIS: Pneumonia  HISTORY OF PRESENT ILLNESS: Pamela Long is a 48 year old very pleasant white female known to me from outpatient office visits.  She had metastatic breast cancer to the liver, ER/PR positive and HER-2 negative diagnosed in January 2020, treated with Abemaciclib and Faslodex from 07/02/2018 through 09/07/2018 with progression.  She received chemotherapy with paclitaxel from 09/18/2018 through 02/25/2019.  She had a metal stent placement on 03/18/2019 in the hepatic duct.  She also developed ascites from hepatic metastatic disease.  She did have intermittent paracentesis done.  Lately she has not been able to take Lasix and spironolactone secondary to nausea.  She presented on 04/19/2019 with shortness of breath and cough.  She also reported pleuritic chest pain.  She was started on IV Levaquin.  She reports cough is slightly improved but she still has some cough when she takes deep breaths.  Chest x-ray on 04/19/2019 showed patchy nonspecific right upper lobe opacity with possible infection.  While in the hospital, her total bilirubin proved from 4.3-3.1.  Her liver enzymes are also improving.  Nausea is under better control.  She does report some blurring of vision but denies any focal weakness.  PAST MEDICAL HISTORY:   Past Medical History:  Diagnosis Date  . Anemia   . Anxiety   . Asthma   . Breast cancer ([place])    Left, 2017  . Complication of anesthesia   . Depression   . GERD (gastroesophageal reflux disease)   . Migraine   . OAB (overactive bladder)   . PONV (postoperative nausea and vomiting)   . Seasonal allergies     ALLERGIES: Allergies  Allergen Reactions  . Salagen [Pilocarpine] Other (See Comments)    Can cause liver failure    . Tape Itching and Other (See Comments)    Depending on the adhesive-blistering occurs  . Amoxicillin Hives and Other (See Comments)    Rash only DID THE REACTION INVOLVE: Swelling of the face/tongue/throat, SOB, or low BP? Sudden or severe rash/hives, skin peeling, or the inside of the mouth or nose?  Did it require medical treatment?  When did it last happen? If all above answers are "NO", may proceed with cephalosporin use.   . Caffeine Diarrhea, Nausea Only, Palpitations and Other (See Comments)    Headache  . Tetanus Toxoids Swelling and Other (See Comments)    Local reaction      MEDICATIONS: I have reviewed the patient's current medications.     PAST SURGICAL HISTORY Past Surgical History:  Procedure Laterality Date  . ABDOMINAL HYSTERECTOMY     breast cancer  . ANKLE RECONSTRUCTION Right 1989  . BILIARY STENT PLACEMENT N/A 09/22/2018   Procedure: BILIARY STENT PLACEMENT;  Surgeon: Rogene Houston, MD;  [facility];  Service: Endoscopy;  Laterality: N/A;  . BILIARY STENT PLACEMENT N/A 03/16/2019   Procedure: BILIARY STENT EXCHANGE;  Surgeon: Rogene Houston, MD;  Location: [facility];  Service: Endoscopy;  Laterality: N/A;  . BREAST SURGERY    . COLONOSCOPY WITH PROPOFOL N/A 08/06/2018   Dr. Oneida Alar: small internal hemorrhoids, moderate external hemorrhoids. rectal bleeding due to ?anal fissure vs hemorrhoids. nex tcs 10 years  . ERCP N/A 09/22/2018   Procedure: ENDOSCOPIC RETROGRADE  CHOLANGIOPANCREATOGRAPHY (ERCP);  Surgeon: Rogene Houston, MD;  Location[facility];  Service: Endoscopy;  Laterality: N/A;  . ERCP N/A 03/16/2019   Procedure: ENDOSCOPIC RETROGRADE CHOLANGIOPANCREATOGRAPHY (ERCP);  Surgeon: Rogene Houston, MD;  Location[facility];  Service: Endoscopy;  Laterality: N/A;  . FOREIGN BODY REMOVAL N/A 08/29/2017   from lip  . KNEE SURGERY Right 1989   bone spur  . MASTECTOMY  2017   bil mastectomies  . PORTACATH PLACEMENT Left  12/23/2018   Procedure: INSERTION PORT-A-CATH LEFT SUBCLAVIAN;  Surgeon: Aviva Signs, MD;  Location: [facility] ORS;  Service: General;  Laterality: Left;  . SINUSOTOMY      FAMILY HISTORY: Family History  Problem Relation Age of Onset  . Arthritis Mother   . COPD Mother   . Depression Mother   . Diabetes Mother   . Kidney disease Father   . Heart disease Father 5  . Drug abuse Father   . Hypertension Father   . Diabetes Daughter   . Hashimoto's thyroiditis Daughter   . Irritable bowel syndrome Daughter   . Autism spectrum disorder Daughter   . Early death Maternal Grandmother        drowned  . Early death Maternal Grandfather   . Heart disease Maternal Grandfather   . Heart disease Paternal Grandfather   . Breast cancer Maternal Aunt   . Breast cancer Cousin   . Lung cancer Maternal Aunt   . Lung cancer Maternal Uncle   . Colon cancer Neg Hx     SOCIAL HISTORY:  reports that she has never smoked. She has never used smokeless tobacco. She reports that she does not drink alcohol or use drugs.  PERFORMANCE STATUS: The patient's performance status is 2 - Symptomatic, <50% confined to bed  PHYSICAL EXAM: Most Recent Vital Signs: Blood pressure 95/64, pulse 81, temperature 98.5 F (36.9 C), temperature source Oral, resp. rate 16, height '5\' 5"'  (1.651 m), weight 163 lb (73.9 kg), SpO2 91 %. BP 95/64 (BP Location: Right Arm)   Pulse 81   Temp 98.5 F (36.9 C) (Oral)   Resp 16   Ht '5\' 5"'  (1.651 m)   Wt 163 lb (73.9 kg)   SpO2 91%   BMI 27.12 kg/m  General appearance: alert, cooperative and appears stated age Head: Normocephalic, without obvious abnormality, atraumatic Neck: no adenopathy Lungs: Bilateral air entry present with decreased breath sounds at bases. Heart: regular rate and rhythm Abdomen: Soft, distended, no palpable masses. Extremities: extremities normal, atraumatic, no cyanosis or edema Skin: Skin color, texture, turgor normal. No rashes or lesions Lymph  nodes: Cervical, supraclavicular, and axillary nodes normal. Neurologic: Grossly normal  LABORATORY DATA:  Results for orders placed or performed during the hospital encounter of 04/19/19 (from the past 48 hour(s))  Brain natriuretic peptide     Status: None   Collection Time: 04/19/19  3:27 PM  Result Value Ref Range   B Natriuretic Peptide 38.0 0.0 - 100.0 pg/mL    Comment: Performed at [facility], [address] [place], [place] [address]  CBC with Differential     Status: Abnormal   Collection Time: 04/19/19  3:33 PM  Result Value Ref Range   WBC 10.1 4.0 - 10.5 K/uL   RBC 4.41 3.87 - 5.11 MIL/uL   Hemoglobin 11.6 (L) 12.0 - 15.0 g/dL   HCT 35.4 (L) 36.0 - 46.0 %   MCV 80.3 80.0 - 100.0 fL   MCH 26.3 26.0 - 34.0 pg  MCHC 32.8 30.0 - 36.0 g/dL   RDW 21.1 (H) 11.5 - 15.5 %   Platelets 183 150 - 400 K/uL   nRBC 0.0 0.0 - 0.2 %   Neutrophils Relative % 80 %   Neutro Abs 8.2 (H) 1.7 - 7.7 K/uL   Lymphocytes Relative 7 %   Lymphs Abs 0.7 0.7 - 4.0 K/uL   Monocytes Relative 11 %   Monocytes Absolute 1.1 (H) 0.1 - 1.0 K/uL   Eosinophils Relative 1 %   Eosinophils Absolute 0.1 0.0 - 0.5 K/uL   Basophils Relative 1 %   Basophils Absolute 0.1 0.0 - 0.1 K/uL   Immature Granulocytes 0 %   Abs Immature Granulocytes 0.03 0.00 - 0.07 K/uL    Comment: Performed at [facility], [address], [place], [place] [address]  CMP     Status: Abnormal   Collection Time: 04/19/19  3:33 PM  Result Value Ref Range   Sodium 131 (L) 135 - 145 mmol/L   Potassium 2.9 (L) 3.5 - 5.1 mmol/L   Chloride 96 (L) 98 - 111 mmol/L   CO2 25 22 - 32 mmol/L   Glucose, Bld 112 (H) 70 - 99 mg/dL   BUN 7 6 - 20 mg/dL   Creatinine, Ser 0.52 0.44 - 1.00 mg/dL   Calcium 8.3 (L) 8.9 - 10.3 mg/dL   Total Protein 6.0 (L) 6.5 - 8.1 g/dL   Albumin 2.4 (L) 3.5 - 5.0 g/dL   AST 170 (H) 15 - 41 U/L   ALT 62 (H) 0 - 44 U/L   Alkaline Phosphatase 449 (H) 38 - 126 U/L   Total Bilirubin 4.3 (H) 0.3 - 1.2 mg/dL   GFR  calc non Af Amer >60 >60 mL/min   GFR calc Af Amer >60 >60 mL/min   Anion gap 10 5 - 15    Comment: Performed at [facility], [address], [place], [place] [address]  Lipase     Status: None   Collection Time: 04/19/19  3:33 PM  Result Value Ref Range   Lipase 15 11 - 51 U/L    Comment: Performed at [facility], [address], [place], [place] [address]  Troponin I (High Sensitivity)     Status: None   Collection Time: 04/19/19  3:33 PM  Result Value Ref Range   Troponin I (High Sensitivity) 8 <18 ng/L    Comment: (NOTE) Elevated high sensitivity troponin I (hsTnI) values and significant  changes across serial measurements may suggest ACS but many other  chronic and acute conditions are known to elevate hsTnI results.  Refer to the "Links" section for chest pain algorithms and additional  guidance. Performed at [facility], [address] [place],  [address]   Magnesium     Status: Abnormal   Collection Time: 04/19/19  3:33 PM  Result Value Ref Range   Magnesium 1.5 (L) 1.7 - 2.4 mg/dL    Comment: Performed at [facility], [address] [place], [place] [address]  SARS CORONAVIRUS 2 (TAT 6-24 HRS) Nasopharyngeal Nasopharyngeal Swab     Status: None   Collection Time: 04/19/19  4:51 PM   Specimen: Nasopharyngeal Swab  Result Value Ref Range   SARS Coronavirus 2 NEGATIVE NEGATIVE    Comment: (NOTE) SARS-CoV-2 target nucleic acids are NOT DETECTED. The SARS-CoV-2 RNA is generally detectable in upper and lower respiratory specimens during the acute phase of infection. Negative results do not preclude SARS-CoV-2 infection, do not rule out co-infections with  other pathogens, and should not be used as the sole basis for treatment or other patient management decisions. Negative results must be combined with clinical observations, patient history, and epidemiological information. The expected result is Negative. Fact Sheet for  Patients: SugarRoll.be Fact Sheet for Healthcare Providers: https://www.woods-mathews.com/ This test is not yet approved or cleared by the [place] FDA and  has been authorized for detection and/or diagnosis of SARS-CoV-2 by FDA under an Emergency Use Authorization (EUA). This EUA will remain  in effect (meaning this test can be used) for the duration of the COVID-19 declaration under Section 56 4(b)(1) of the Act, 21 U.S.C. section 360bbb-3(b)(1), unless the authorization is terminated or revoked sooner. Performed at [facility], [place] [address] [place], [place] [address]   Troponin I (High Sensitivity)     Status: None   Collection Time: 04/19/19  5:08 PM  Result Value Ref Range   Troponin I (High Sensitivity) 8 <18 ng/L    Comment: (NOTE) Elevated high sensitivity troponin I (hsTnI) values and significant  changes across serial measurements may suggest ACS but many other  chronic and acute conditions are known to elevate hsTnI results.  Refer to the "Links" section for chest pain algorithms and additional  guidance. Performed at [facility], [address] [place], [place] [address]   Lactic acid     Status: Abnormal   Collection Time: 04/19/19  5:08 PM  Result Value Ref Range   Lactic Acid, Venous 2.0 (HH) 0.5 - 1.9 mmol/L    Comment: CRITICAL RESULT CALLED TO, READ BACK BY AND VERIFIED WITH: GENTRY,R ON 04/19/19 AT 1755 BY LOY,C Performed at [facility], [address], [place], [place] [address]   Lactic acid, plasma     Status: Abnormal   Collection Time: 04/19/19  8:42 PM  Result Value Ref Range   Lactic Acid, Venous 2.2 (HH) 0.5 - 1.9 mmol/L    Comment: CRITICAL RESULT CALLED TO, READ BACK BY AND VERIFIED WITH: CROWE,M AT 2159 ON 11.9.20 BY ISLEY,B Performed at [facility], [address], [place], [place] [address]   Basic metabolic panel     Status: Abnormal   Collection Time: 04/20/19  4:44 AM  Result Value  Ref Range   Sodium 135 135 - 145 mmol/L   Potassium 3.1 (L) 3.5 - 5.1 mmol/L   Chloride 97 (L) 98 - 111 mmol/L   CO2 27 22 - 32 mmol/L   Glucose, Bld 85 70 - 99 mg/dL   BUN 7 6 - 20 mg/dL   Creatinine, Ser 0.53 0.44 - 1.00 mg/dL   Calcium 8.1 (L) 8.9 - 10.3 mg/dL   GFR calc non Af Amer >60 >60 mL/min   GFR calc Af Amer >60 >60 mL/min   Anion gap 11 5 - 15    Comment: Performed at [facility], [address] [place],  [address]  CBC     Status: Abnormal   Collection Time: 04/20/19  4:44 AM  Result Value Ref Range   WBC 9.3 4.0 - 10.5 K/uL   RBC 4.14 3.87 - 5.11 MIL/uL   Hemoglobin 10.7 (L) 12.0 - 15.0 g/dL   HCT 33.5 (L) 36.0 - 46.0 %   MCV 80.9 80.0 - 100.0 fL   MCH 25.8 (L) 26.0 - 34.0 pg   MCHC 31.9 30.0 - 36.0 g/dL   RDW 20.9 (H) 11.5 - 15.5 %   Platelets 187 150 - 400 K/uL   nRBC 0.0 0.0 - 0.2 %  Comment: Performed at [facility], [address], [place], [place] [address]  Magnesium     Status: None   Collection Time: 04/20/19  4:44 AM  Result Value Ref Range   Magnesium 2.1 1.7 - 2.4 mg/dL    Comment: Performed at [facility], [address] [place], [place] [address]  Hepatic function panel     Status: Abnormal   Collection Time: 04/20/19  4:44 AM  Result Value Ref Range   Total Protein 5.6 (L) 6.5 - 8.1 g/dL   Albumin 2.1 (L) 3.5 - 5.0 g/dL   AST 144 (H) 15 - 41 U/L   ALT 52 (H) 0 - 44 U/L   Alkaline Phosphatase 393 (H) 38 - 126 U/L   Total Bilirubin 3.7 (H) 0.3 - 1.2 mg/dL   Bilirubin, Direct 1.9 (H) 0.0 - 0.2 mg/dL   Indirect Bilirubin 1.8 (H) 0.3 - 0.9 mg/dL    Comment: Performed at [facility], [address], [place], [place] [address]  [facility]     Status: Abnormal   Collection Time: 04/21/19  6:12 AM  Result Value Ref Range   WBC 9.8 4.0 - 10.5 K/uL   RBC 4.18 3.87 - 5.11 MIL/uL   Hemoglobin 10.8 (L) 12.0 - 15.0 g/dL   HCT 34.1 (L) 36.0 - 46.0 %   MCV 81.6 80.0 - 100.0 fL   MCH 25.8 (L) 26.0 - 34.0 pg   MCHC 31.7 30.0 - 36.0 g/dL   RDW 21.8  (H) 11.5 - 15.5 %   Platelets 190 150 - 400 K/uL   nRBC 0.0 0.0 - 0.2 %    Comment: Performed at [facility], [address] [place], [place] [address]  Comprehensive metabolic panel     Status: Abnormal   Collection Time: 04/21/19  6:12 AM  Result Value Ref Range   Sodium 135 135 - 145 mmol/L   Potassium 4.0 3.5 - 5.1 mmol/L    Comment: DELTA CHECK NOTED   Chloride 100 98 - 111 mmol/L   CO2 27 22 - 32 mmol/L   Glucose, Bld 98 70 - 99 mg/dL   BUN 9 6 - 20 mg/dL   Creatinine, Ser 0.52 0.44 - 1.00 mg/dL   Calcium 8.3 (L) 8.9 - 10.3 mg/dL   Total Protein 5.0 (L) 6.5 - 8.1 g/dL   Albumin 2.0 (L) 3.5 - 5.0 g/dL   AST 126 (H) 15 - 41 U/L   ALT 44 0 - 44 U/L   Alkaline Phosphatase 383 (H) 38 - 126 U/L   Total Bilirubin 3.1 (H) 0.3 - 1.2 mg/dL   GFR calc non Af Amer >60 >60 mL/min   GFR calc Af Amer >60 >60 mL/min   Anion gap 8 5 - 15    Comment: Performed at [facility], [address], [place], [place] [address]  Magnesium     Status: None   Collection Time: 04/21/19  6:12 AM  Result Value Ref Range   Magnesium 1.8 1.7 - 2.4 mg/dL    Comment: Performed at [facility], [address] [place], [place] [address]  Lactic acid, plasma     Status: None   Collection Time: 04/21/19  6:12 AM  Result Value Ref Range   Lactic Acid, Venous 1.6 0.5 - 1.9 mmol/L    Comment: Performed at [facility], [address], [place], [place] [address]      RADIOGRAPHY: Dg Chest Port 1 View  Result Date: 04/19/2019 CLINICAL DATA:  48 year old female with  shortness of breath for 1 week. Productive cough. Undergoing chemotherapy for breast cancer. EXAM: PORTABLE CHEST 1 VIEW COMPARISON:  04/10/2019 and earlier. FINDINGS: Portable AP upright view at 1620 hours. Stable left chest Port-A-Cath. Continued bibasilar opacity which on CT the same day as prior was related to moderate to large bilateral pleural effusions. No superimposed pneumothorax. Pulmonary vascularity has decreased. Mild nonspecific patchy  right upper lobe opacity now suspected at the anterior 2nd rib level. No other confluent opacity. Stable cardiac size and mediastinal contours. Stable visible bowel gas pattern. Ascites present before. Metal CBD stent redemonstrated. Stable visualized osseous structures. IMPRESSION: 1. Suspect ongoing bilateral pleural effusions although pulmonary vascularity has decreased from prior. 2. Patchy nonspecific right upper lobe opacity. Pulmonary infection is possible. 3. Stable radiographic appearance of the upper abdomen. Electronically Signed   By: Genevie Ann M.D.   On: 04/19/2019 16:41         ASSESSMENT and PLAN:  1.  Metastatic breast cancer to the liver, ER/PR positive and HER-2 negative: -She progressed on the most recent regimen of paclitaxel from 09/18/2018 through 02/25/2019. -Metal stent placement in the CBD on 03/18/2019. -I reviewed results of the most recent CT CAP from 04/10/2019 which showed interval increase in moderate-sized bilateral pleural effusions, left more than right.  There are multiple new masses in the liver more superiorly.  Moderate to large amount of ascites present. -We talked about various options at this time.  Once she is discharged from the hospital we will consider switching her chemotherapy to eribulin or gemcitabine or other agents based on her total bilirubin.  2.  Hypoxemic respiratory failure: -She has bilateral pleural effusions, left more than right.  She is currently on oxygen. -I would recommend a left-sided thoracentesis to improve her aeration and oxygenation.  This would also optimize her functional status when she is discharged home.  Please consider abdominal paracentesis if her breathing does not improve after thoracentesis.  3.  Elevated liver enzymes: -CT AP on 04/10/2019 showed mild intrahepatic biliary ductal dilatation in the lateral segment of the left lobe. -Her transaminases and bilirubin is improving.  Today bilirubin is 3.1.  We will closely  monitor it.  All questions were answered. The patient knows to call the clinic with any problems, questions or concerns. We can certainly see the patient much sooner if necessary.    Derek Jack

## 2019-04-21 NOTE — Plan of Care (Signed)
  Problem: Activity: °Goal: Ability to tolerate increased activity will improve °Outcome: Progressing °  °Problem: Clinical Measurements: °Goal: Ability to maintain a body temperature in the normal range will improve °Outcome: Progressing °  °Problem: Respiratory: °Goal: Ability to maintain adequate ventilation will improve °Outcome: Progressing °Goal: Ability to maintain a clear airway will improve °Outcome: Progressing °  °Problem: Education: °Goal: Knowledge of General Education information will improve °Description: Including pain rating scale, medication(s)/side effects and non-pharmacologic comfort measures °Outcome: Progressing °  °Problem: Clinical Measurements: °Goal: Ability to maintain clinical measurements within normal limits will improve °Outcome: Progressing °Goal: Will remain free from infection °Outcome: Progressing °Goal: Diagnostic test results will improve °Outcome: Progressing °Goal: Respiratory complications will improve °Outcome: Progressing °Goal: Cardiovascular complication will be avoided °Outcome: Progressing °  °

## 2019-04-21 NOTE — Progress Notes (Addendum)
PROGRESS NOTE    Pamela Long  [id number] DOB: [birth year] DOA: 04/19/2019 PCP: Doree Albee, MD    Brief Narrative:  48 year old female who presented with cough and dyspnea.  She does have significant past medical history for metastatic breast cancer, depression and asthma.  She reported worsening productive cough, associated with difficulty breathing for about a week.  Positive pleuritic chest pain.  On her initial physical examination her heart rate was 119, oxygen saturation 89% on room air, blood pressure 131/86, respiratory rate 19.  Her lungs were clear to auscultation bilaterally, heart S1-S2 present, tachycardic, abdomen soft, trace lower extremity edema more left than right SARS COVID-19 negative.  Chest radiograph with faint infiltrate right upper lobe, atelectasis left lower lobe with small pleural effusion.   Patient was admitted to the hospital with the working diagnosis of acute hypoxic respiratory failure due to community-acquired pneumonia, present on admission.  She has been responding well to antibiotic therapy with levofloxacin. Underwent left pleural effusion thoracentesis with 980 fluid obtained.   Assessment & Plan:   Principal Problem:   Pneumonia Active Problems:   Asthma   Depression   Metastatic breast cancer (HCC)   Respiratory distress   Malignant ascites   Malignant pleural effusion   1. Acute hypoxic respiratory failure due to right upper lobe community acquired pneumonia, complicated with left plural effusion. Patient continue to have dyspnea and pleuritic chest pain, oxymetry is 97% on 2 LPM per [place]. US guided thoracentesis to the left with 980 ml of yellow fluid (follow up on fluid analysis). Will continue antibiotic therapy with levofloxacin, and will follow chest film in am, if improved infiltrate possible symptoms mainly due to effusion and not infectious process. Follow on procalcitonin for further de-escalation of antibiotic therapy.  Continue bronchodilator therapy.  2. Metastatic breast cancer/ liver metastais. On chemotherapy as outpatient, will follow as outpatient. AST 126 with ALT 44, trending down, T bil at 3,1 and alkaline phos 383.   3. Hypokalemia and hypomagnesemia. Electrolytes have been corrected. Follow on renal panel in am.  K corrected to 4,0, mg today down to 1,8. Continue correction with Mag sulfate. Renal function stale with serum cr at 0.52.   4. HTN. Blood pressure with systolic in the [id number] will continue metoprolol to prevent rebound tachycardia.   5. Asthma. Stable with no signs of exacerbation.    DVT prophylaxis: enoxaparin   Code Status:  full Family Communication: no family at the bedside  Disposition Plan/ discharge barriers:  Pending clinical improvement.   Body mass index is 27.12 kg/m. Malnutrition Type:  Nutrition Problem: Severe Malnutrition Etiology: cancer and cancer related treatments(breast cancer with mets to liver; last chemotherapy 10/15)   Malnutrition Characteristics:  Signs/Symptoms: percent weight loss, moderate fat depletion, severe fat depletion, moderate muscle depletion, severe muscle depletion, edema, energy intake < or equal to 75% for > or equal to 1 month, per patient/family report Percent weight loss: 12.6 %(23.5 lbs x 3 months)   Nutrition Interventions:  Interventions: Boost Breeze, Education  RN Pressure Injury Documentation:     Consultants:   Oncology  Interventional radiology   Procedures:     Antimicrobials:       Subjective: Patient continue to have dyspnea, and pleuritic chest pain, no nausea or vomiting. She had paracentesis last week and left thoracentesis in the past as well. No cough. Very weak and deconditioned.   Objective: Vitals:   04/20/19 1500 04/20/19 2116 04/21/19 0512 04/21/19 1011  BP: (!) 149/84  98/65 95/64 96/67   Pulse: (!) 110 94 81 100  Resp: 20 16 16    Temp: 98.7 F (37.1 C) 98.1 F (36.7 C) 98.5 F (36.9  C)   TempSrc: Oral Oral Oral   SpO2: 94% 93% 91%   Weight:      Height:        Intake/Output Summary (Last 24 hours) at 04/21/2019 1257 Last data filed at 04/21/2019 1000 Gross per 24 hour  Intake 927 ml  Output -  Net 927 ml   Filed Weights   04/19/19 1521  Weight: 73.9 kg    Examination:   General: Not in pain or dyspnea, deconditioned and ill looking appearing  Neurology: Awake and alert, non focal  E ENT: mild pallor, no icterus, oral mucosa moist Cardiovascular: No JVD. S1-S2 present, rhythmic, no gallops, rubs, or murmurs. Positive lower extremity edema, more right than left.  Pulmonary: positive breath sounds bilaterally, decreased breath sounds at the left base, and dull to percussion. Gastrointestinal. Abdomen with, no organomegaly, non tender, no rebound or guarding Skin. No rashes Musculoskeletal: no joint deformities     Data Reviewed: I have personally reviewed following labs and imaging studies  CBC: Recent Labs  Lab 04/19/19 1533 04/20/19 0444 04/21/19 0612  WBC 10.1 9.3 9.8  NEUTROABS 8.2*  --   --   HGB 11.6* 10.7* 10.8*  HCT 35.4* 33.5* 34.1*  MCV 80.3 80.9 81.6  PLT 183 187 [id number]   Basic Metabolic Panel: Recent Labs  Lab 04/19/19 1533 04/20/19 0444 04/21/19 0612  NA 131* 135 135  K 2.9* 3.1* 4.0  CL 96* 97* 100  CO2 25 27 27   GLUCOSE 112* 85 98  BUN 7 7 9   CREATININE 0.52 0.53 0.52  CALCIUM 8.3* 8.1* 8.3*  MG 1.5* 2.1 1.8   GFR: Estimated Creatinine Clearance: 84.7 mL/min (by C-G formula based on SCr of 0.52 mg/dL). Liver Function Tests: Recent Labs  Lab 04/19/19 1533 04/20/19 0444 04/21/19 0612  AST 170* 144* 126*  ALT 62* 52* 44  ALKPHOS 449* 393* 383*  BILITOT 4.3* 3.7* 3.1*  PROT 6.0* 5.6* 5.0*  ALBUMIN 2.4* 2.1* 2.0*   Recent Labs  Lab 04/19/19 1533  LIPASE 15   No results for input(s): AMMONIA in the last 168 hours. Coagulation Profile: No results for input(s): INR, PROTIME in the last 168 hours. Cardiac  Enzymes: No results for input(s): CKTOTAL, CKMB, CKMBINDEX, TROPONINI in the last 168 hours. BNP (last 3 results) No results for input(s): PROBNP in the last 8760 hours. HbA1C: No results for input(s): HGBA1C in the last 72 hours. CBG: No results for input(s): GLUCAP in the last 168 hours. Lipid Profile: No results for input(s): CHOL, HDL, LDLCALC, TRIG, CHOLHDL, LDLDIRECT in the last 72 hours. Thyroid Function Tests: No results for input(s): TSH, T4TOTAL, FREET4, T3FREE, THYROIDAB in the last 72 hours. Anemia Panel: No results for input(s): VITAMINB12, FOLATE, FERRITIN, TIBC, IRON, RETICCTPCT in the last 72 hours.    Radiology Studies: I have reviewed all of the imaging during this hospital visit personally     Scheduled Meds: . Chlorhexidine Gluconate Cloth  6 each Topical Daily  . enoxaparin (LOVENOX) injection  40 mg Subcutaneous Q24H  . feeding supplement  1 Container Oral TID BM  . gabapentin  300 mg Oral QHS  . lubiprostone  8 mcg Oral BID WC  . metoprolol tartrate  12.5 mg Oral BID  . pantoprazole  40 mg Oral q morning - 10a   Continuous  Infusions: . levofloxacin (LEVAQUIN) IV Stopped (04/20/19 1854)     LOS: 2 days        Phylisha Dix Gerome Apley, MD

## 2019-04-21 NOTE — Procedures (Signed)
PreOperative Dx: Breast cancer, LEFT pleural effusion Postoperative Dx: Breast cancer, LEFT pleural effusion Procedure:   US guided LEFT thoracentesis Radiologist:  Thornton Papas Anesthesia:  10 ml of 1% lidocaine Specimen:  980 mL of yellow colored fluid EBL:   < 1 ml Complications: None

## 2019-04-21 NOTE — Progress Notes (Signed)
Thoracentesis completed. 980 cc amber fluid drained. Pt remained stable throughout procedure.

## 2019-04-22 ENCOUNTER — Inpatient Hospital Stay (HOSPITAL_COMMUNITY): Payer: 59

## 2019-04-22 ENCOUNTER — Ambulatory Visit (HOSPITAL_COMMUNITY): Payer: 59 | Admitting: Hematology

## 2019-04-22 ENCOUNTER — Ambulatory Visit (HOSPITAL_COMMUNITY): Payer: 59

## 2019-04-22 ENCOUNTER — Encounter (HOSPITAL_COMMUNITY): Payer: Self-pay | Admitting: Radiology

## 2019-04-22 ENCOUNTER — Other Ambulatory Visit (HOSPITAL_COMMUNITY): Payer: 59

## 2019-04-22 LAB — BASIC METABOLIC PANEL
Anion gap: 10 (ref 5–15)
BUN: 10 mg/dL (ref 6–20)
CO2: 25 mmol/L (ref 22–32)
Calcium: 8.5 mg/dL — ABNORMAL LOW (ref 8.9–10.3)
Chloride: 100 mmol/L (ref 98–111)
Creatinine, Ser: 0.57 mg/dL (ref 0.44–1.00)
GFR calc Af Amer: >60 mL/min (ref >60)
GFR calc non Af Amer: >60 mL/min (ref >60)
Glucose, Bld: 101 mg/dL — ABNORMAL HIGH (ref 70–99)
Potassium: 4 mmol/L (ref 3.5–5.1)
Sodium: 135 mmol/L (ref 135–145)

## 2019-04-22 LAB — CBC WITH DIFFERENTIAL/PLATELET
Abs Immature Granulocytes: 0.05 10*3/uL (ref 0.00–0.07)
Basophils Absolute: 0 10*3/uL (ref 0.0–0.1)
Basophils Relative: 0 %
Eosinophils Absolute: 0.1 10*3/uL (ref 0.0–0.5)
Eosinophils Relative: 1 %
HCT: 34.5 % — ABNORMAL LOW (ref 36.0–46.0)
Hemoglobin: 11.1 g/dL — ABNORMAL LOW (ref 12.0–15.0)
Immature Granulocytes: 1 %
Lymphocytes Relative: 8 %
Lymphs Abs: 0.8 10*3/uL (ref 0.7–4.0)
MCH: 26.2 pg (ref 26.0–34.0)
MCHC: 32.2 g/dL (ref 30.0–36.0)
MCV: 81.6 fL (ref 80.0–100.0)
Monocytes Absolute: 1 10*3/uL (ref 0.1–1.0)
Monocytes Relative: 11 %
Neutro Abs: 7.6 10*3/uL (ref 1.7–7.7)
Neutrophils Relative %: 79 %
Platelets: 212 10*3/uL (ref 150–400)
RBC: 4.23 MIL/uL (ref 3.87–5.11)
RDW: 21.8 % — ABNORMAL HIGH (ref 11.5–15.5)
WBC: 9.5 10*3/uL (ref 4.0–10.5)
nRBC: 0 % (ref 0.0–0.2)

## 2019-04-22 LAB — PH, BODY FLUID: pH, Body Fluid: 7.5

## 2019-04-22 LAB — MAGNESIUM: Magnesium: 1.9 mg/dL (ref 1.7–2.4)

## 2019-04-22 LAB — LACTATE DEHYDROGENASE: LDH: 662 U/L — ABNORMAL HIGH (ref 98–192)

## 2019-04-22 LAB — PROCALCITONIN: Procalcitonin: 0.17 ng/mL

## 2019-04-22 IMAGING — DX DG CHEST 2V
2 series · 2 of 2 positions shown · non-contrast
Comparison: [DATE]

CLINICAL DATA: Patient admitted for pneumonia.  Worsening cough.

EXAM:
CHEST - 2 VIEW

[chest lat]
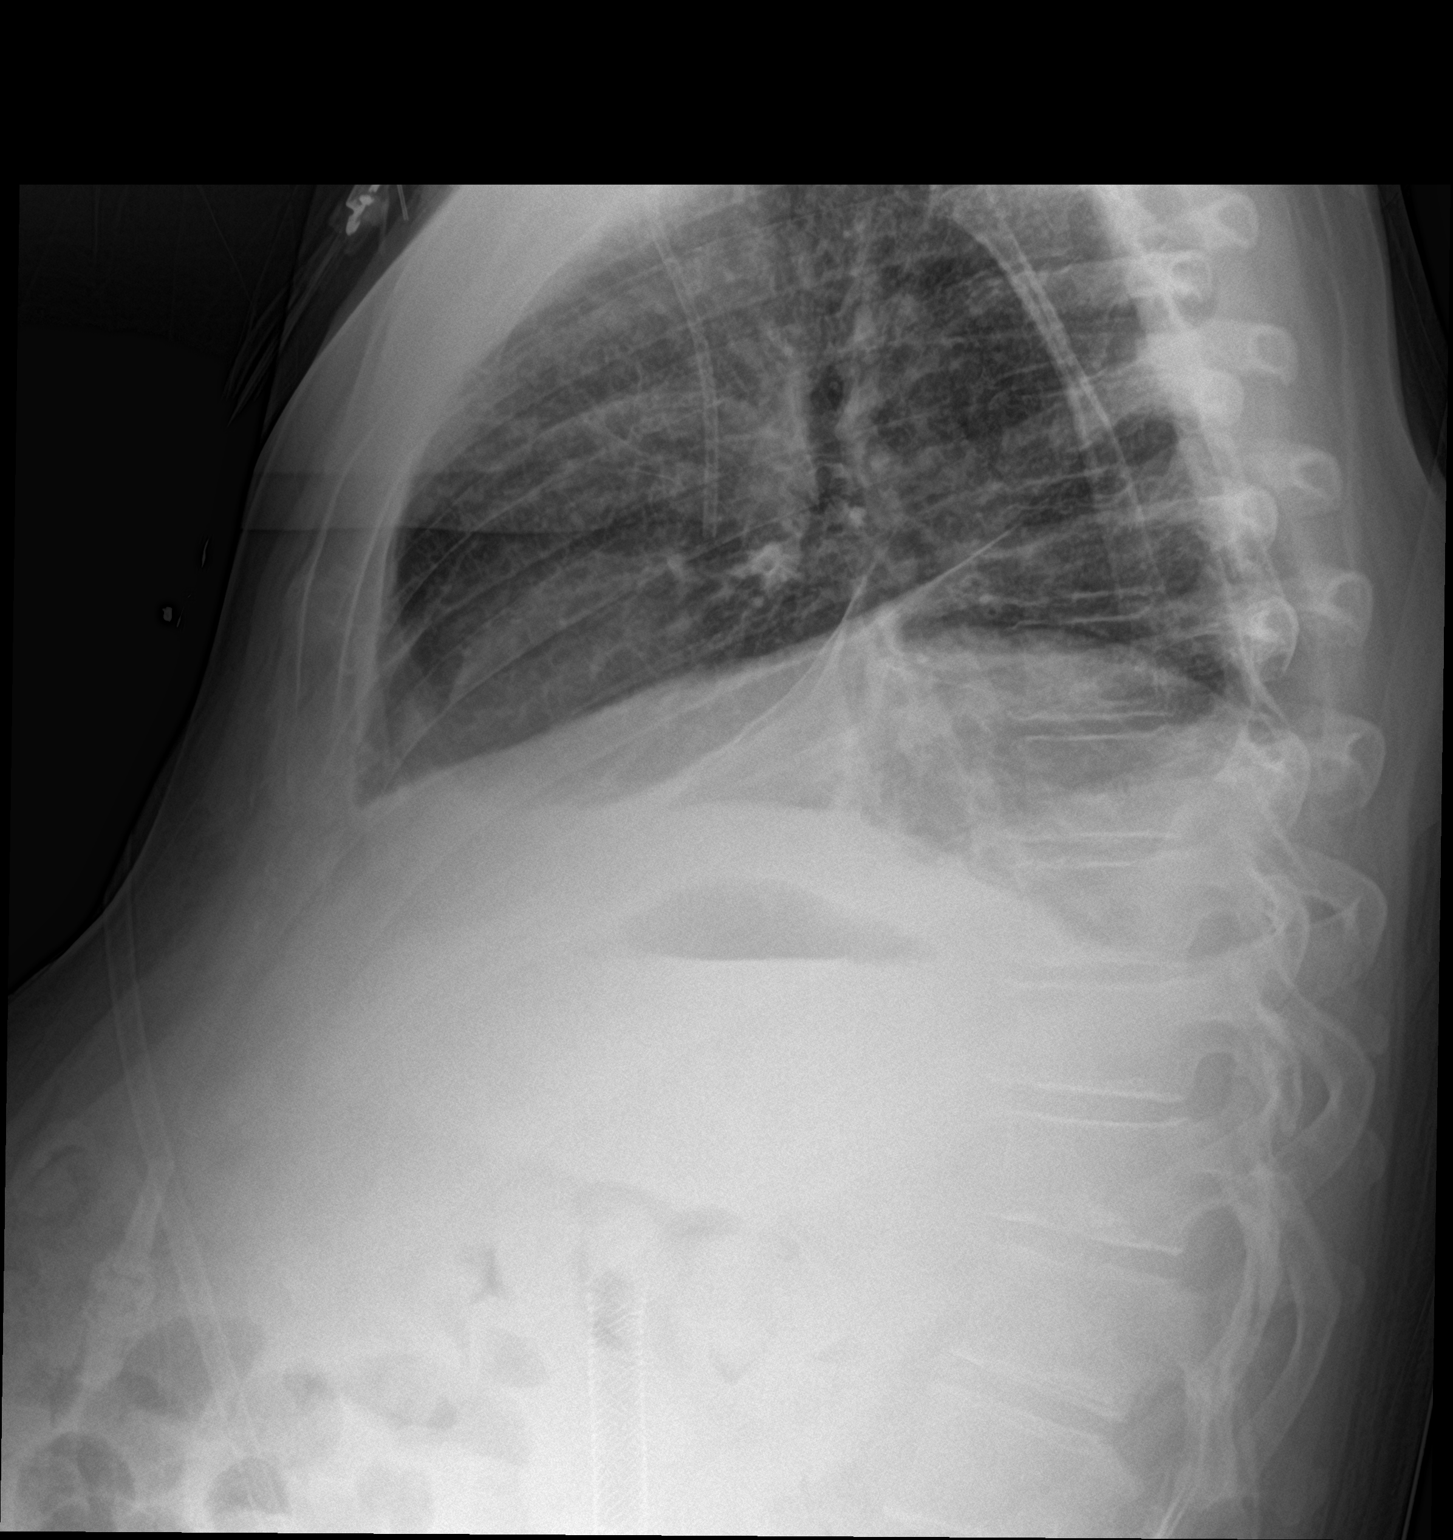

[chest ap]
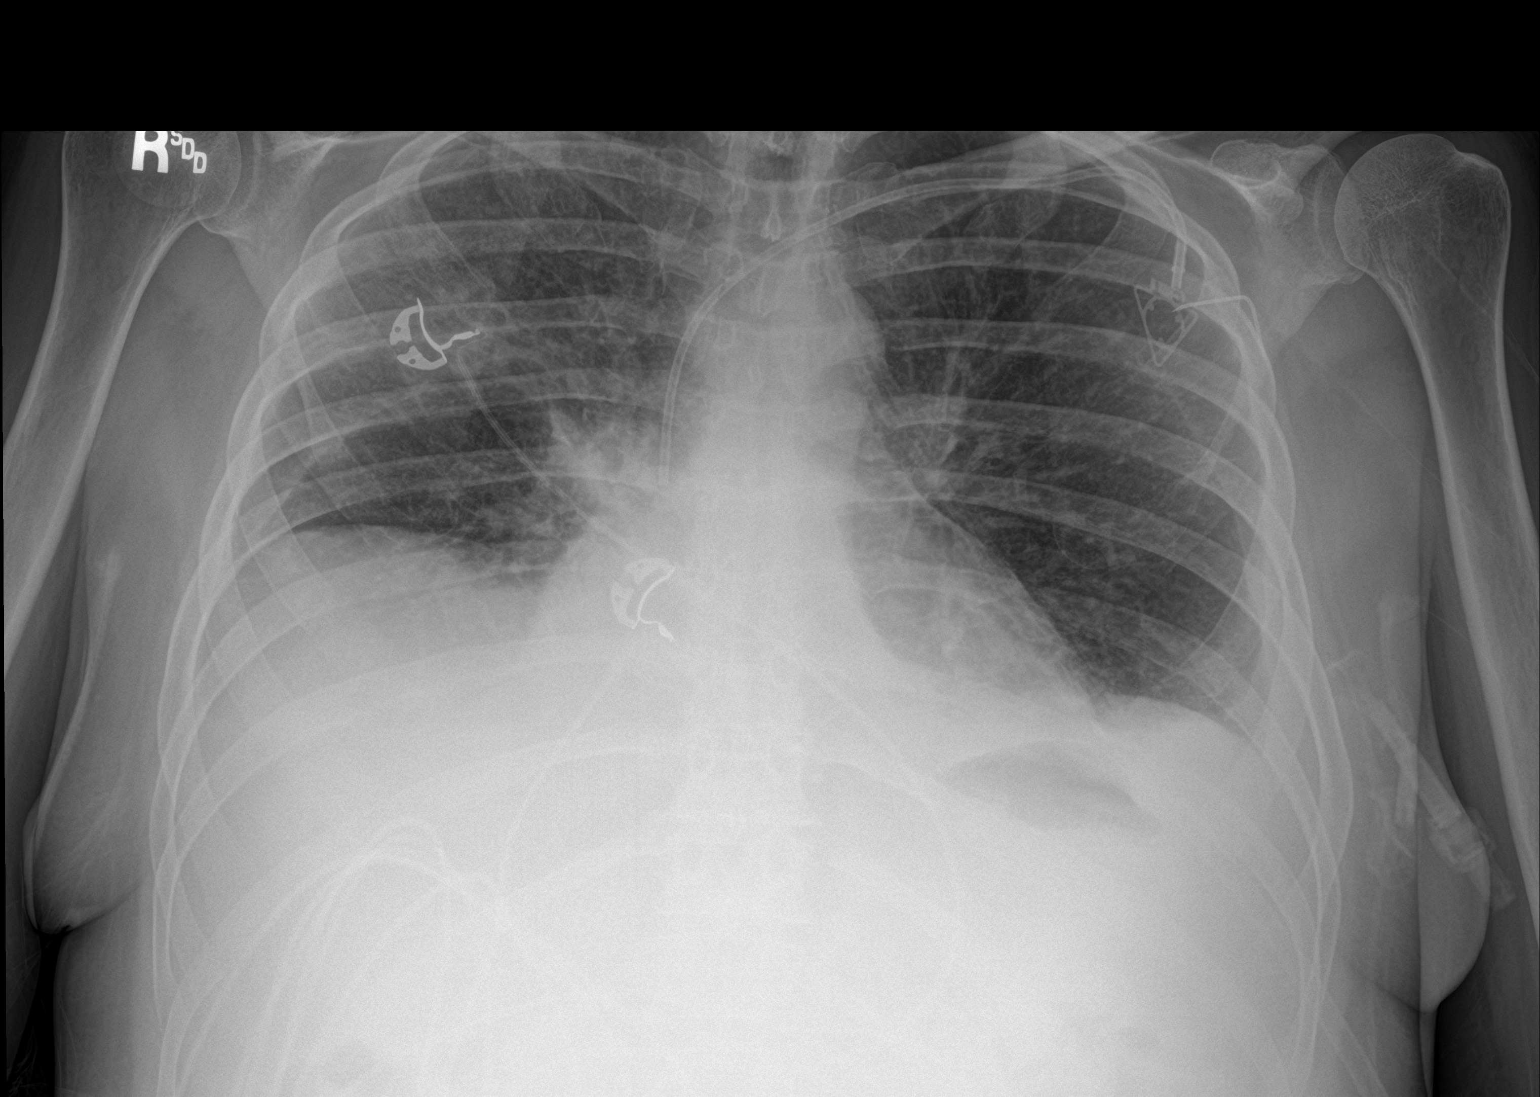

[2 of 2 positions shown; findings below may reference images not displayed]

FINDINGS: Increased interstitial markings, right greater than left. Probable
small effusions with atelectasis. Chronic elevation of the right
hemidiaphragm. Stable cardiomediastinal silhouette. Increased
density in the right hilar region is stable, possibly atelectasis.
The left Port-A-Cath is stable.
IMPRESSION: 1. Increased interstitial markings may represent atypical infection
versus asymmetric edema.
2. Increased density in the right hilum is stable, possibly
atelectasis. Recommend attention on follow-up.
3. Probable small effusions.

## 2019-04-22 MED ORDER — PROMETHAZINE HCL 25 MG/ML IJ SOLN
12.5000 mg | Freq: Four times a day (QID) | INTRAMUSCULAR | Status: DC | PRN
  Administered 2019-04-22 – 2019-04-27 (×12): 12.5 mg via INTRAVENOUS
  Filled 2019-04-22 (×13): qty 1

## 2019-04-22 NOTE — Progress Notes (Signed)
PROGRESS NOTE    Pamela Long  [id number] DOB: [birth year] DOA: 04/19/2019 PCP: Doree Albee, MD    Brief Narrative:  48 year old female who presented with cough and dyspnea.  She does have significant past medical history for metastatic breast cancer, depression and asthma.  She reported worsening productive cough, associated with difficulty breathing for about a week.  Positive pleuritic chest pain.  On her initial physical examination her heart rate was 119, oxygen saturation 89% on room air, blood pressure 131/86, respiratory rate 19.  Her lungs were clear to auscultation bilaterally, heart S1-S2 present, tachycardic, abdomen soft, trace lower extremity edema more left than right SARS COVID-19 negative.  Chest radiograph with faint infiltrate right upper lobe, atelectasis left lower lobe with small pleural effusion.   Patient was admitted to the hospital with the working diagnosis of acute hypoxic respiratory failure due to community-acquired pneumonia, present on admission.  She has been responding well to antibiotic therapy with levofloxacin. Underwent left pleural effusion thoracentesis with 980 fluid obtained.   Assessment & Plan:   Principal Problem:   Pneumonia Active Problems:   Asthma   Depression   Metastatic breast cancer (HCC)   Respiratory distress   Malignant ascites   Malignant pleural effusion   1. Acute hypoxic respiratory failure due to right upper lobe community acquired pneumonia, complicated with left plural effusion. Patient continue to have dyspnea and pleuritic chest pain, oxymetry is 97% on 2 LPM per [place]. US guided thoracentesis to the left with 980 ml of yellow fluid.  Fluid analysis indicates transudate of process. Will continue antibiotic therapy with levofloxacin for possible pneumonia. Continue bronchodilator therapy.  Since she is still short of breath, will request evaluation of right-sided pleural effusion  2. Metastatic breast cancer/  liver metastais. On chemotherapy as outpatient, will follow as outpatient. AST 126 with ALT 44, trending down, T bil at 3,1 and alkaline phos 383.   3. Hypokalemia and hypomagnesemia.  Replaced. Renal function stale with serum cr at 0.52.   4. HTN.  Blood pressure stable on metoprolol  5. Asthma. Stable with no signs of exacerbation.    DVT prophylaxis: enoxaparin   Code Status:  full Family Communication: no family at the bedside  Disposition Plan/ discharge barriers:  Pending clinical improvement.   Body mass index is 27.12 kg/m. Malnutrition Type:  Nutrition Problem: Severe Malnutrition Etiology: cancer and cancer related treatments(breast cancer with mets to liver; last chemotherapy 10/15)   Malnutrition Characteristics:  Signs/Symptoms: percent weight loss, moderate fat depletion, severe fat depletion, moderate muscle depletion, severe muscle depletion, edema, energy intake < or equal to 75% for > or equal to 1 month, per patient/family report Percent weight loss: 12.6 %(23.5 lbs x 3 months)   Nutrition Interventions:  Interventions: Boost Breeze, Education  RN Pressure Injury Documentation:     Consultants:   Oncology  Interventional radiology   Procedures:   11/6 paracentesis with removal of 4.9 L of fluid  11/11 left thoracentesis with removal of 980 mL of fluid  Antimicrobials:       Subjective: Still feels short of breath.  Feels her abdomen is more distended which may be contributing to her shortness of breath.  Has diffuse abdominal pain.  Continues to have nausea and vomiting.  Objective: Vitals:   04/21/19 2115 04/22/19 0515 04/22/19 0515 04/22/19 0956  BP: 99/69 (!) 115/59 (!) 115/59 112/70  Pulse: (!) 107 (!) 117 (!) 115 (!) 115  Resp: 16 18 18    Temp:  98.1 F (36.7 C) 98.2 F (36.8 C) 98.2 F (36.8 C) 98 F (36.7 C)  TempSrc: Oral Oral Oral Oral  SpO2: 95% 94% 96% 93%  Weight:      Height:        Intake/Output Summary (Last 24  hours) at 04/22/2019 2058 Last data filed at 04/22/2019 1300 Gross per 24 hour  Intake 480 ml  Output -  Net 480 ml   Filed Weights   04/19/19 1521  Weight: 73.9 kg    Examination:   General exam: Alert, awake, oriented x 3 Respiratory system: Clear to auscultation. Respiratory effort normal. Cardiovascular system:RRR. No murmurs, rubs, gallops. Gastrointestinal system: Abdomen is distended, soft and diffusely tender. No organomegaly or masses felt. Normal bowel sounds heard. Central nervous system: Alert and oriented. No focal neurological deficits. Extremities: No C/C/E, +pedal pulses Skin: No rashes, lesions or ulcers Psychiatry: Judgement and insight appear normal. Mood & affect appropriate.   Data Reviewed: I have personally reviewed following labs and imaging studies  CBC: Recent Labs  Lab 04/19/19 1533 04/20/19 0444 04/21/19 0612 04/22/19 0503  WBC 10.1 9.3 9.8 9.5  NEUTROABS 8.2*  --   --  7.6  HGB 11.6* 10.7* 10.8* 11.1*  HCT 35.4* 33.5* 34.1* 34.5*  MCV 80.3 80.9 81.6 81.6  PLT 183 187 190 [id number]   Basic Metabolic Panel: Recent Labs  Lab 04/19/19 1533 04/20/19 0444 04/21/19 0612 04/22/19 0503  NA 131* 135 135 135  K 2.9* 3.1* 4.0 4.0  CL 96* 97* 100 100  CO2 25 27 27 25   GLUCOSE 112* 85 98 101*  BUN 7 7 9 10   CREATININE 0.52 0.53 0.52 0.57  CALCIUM 8.3* 8.1* 8.3* 8.5*  MG 1.5* 2.1 1.8 1.9   GFR: Estimated Creatinine Clearance: 84.7 mL/min (by C-G formula based on SCr of 0.57 mg/dL). Liver Function Tests: Recent Labs  Lab 04/19/19 1533 04/20/19 0444 04/21/19 0612  AST 170* 144* 126*  ALT 62* 52* 44  ALKPHOS 449* 393* 383*  BILITOT 4.3* 3.7* 3.1*  PROT 6.0* 5.6* 5.0*  ALBUMIN 2.4* 2.1* 2.0*   Recent Labs  Lab 04/19/19 1533  LIPASE 15   No results for input(s): AMMONIA in the last 168 hours. Coagulation Profile: No results for input(s): INR, PROTIME in the last 168 hours. Cardiac Enzymes: No results for input(s): CKTOTAL, CKMB,  CKMBINDEX, TROPONINI in the last 168 hours. BNP (last 3 results) No results for input(s): PROBNP in the last 8760 hours. HbA1C: No results for input(s): HGBA1C in the last 72 hours. CBG: No results for input(s): GLUCAP in the last 168 hours. Lipid Profile: No results for input(s): CHOL, HDL, LDLCALC, TRIG, CHOLHDL, LDLDIRECT in the last 72 hours. Thyroid Function Tests: No results for input(s): TSH, T4TOTAL, FREET4, T3FREE, THYROIDAB in the last 72 hours. Anemia Panel: No results for input(s): VITAMINB12, FOLATE, FERRITIN, TIBC, IRON, RETICCTPCT in the last 72 hours.    Radiology Studies: I have reviewed all of the imaging during this hospital visit personally     Scheduled Meds: . Chlorhexidine Gluconate Cloth  6 each Topical Daily  . enoxaparin (LOVENOX) injection  40 mg Subcutaneous Q24H  . feeding supplement  1 Container Oral TID BM  . gabapentin  300 mg Oral QHS  . lubiprostone  8 mcg Oral BID WC  . metoprolol tartrate  12.5 mg Oral BID  . pantoprazole  40 mg Oral q morning - 10a   Continuous Infusions: . levofloxacin (LEVAQUIN) IV 750 mg (04/22/19 1818)  LOS: 3 days        Kathie Dike, MD

## 2019-04-23 ENCOUNTER — Inpatient Hospital Stay (HOSPITAL_COMMUNITY): Payer: 59

## 2019-04-23 ENCOUNTER — Encounter (HOSPITAL_COMMUNITY): Payer: Self-pay | Admitting: Cardiology

## 2019-04-23 LAB — COMPREHENSIVE METABOLIC PANEL
ALT: 47 U/L — ABNORMAL HIGH (ref 0–44)
AST: 185 U/L — ABNORMAL HIGH (ref 15–41)
Albumin: 2.1 g/dL — ABNORMAL LOW (ref 3.5–5.0)
Alkaline Phosphatase: 508 U/L — ABNORMAL HIGH (ref 38–126)
Anion gap: 10 (ref 5–15)
BUN: 9 mg/dL (ref 6–20)
CO2: 25 mmol/L (ref 22–32)
Calcium: 8.5 mg/dL — ABNORMAL LOW (ref 8.9–10.3)
Chloride: 99 mmol/L (ref 98–111)
Creatinine, Ser: 0.59 mg/dL (ref 0.44–1.00)
GFR calc Af Amer: >60 mL/min (ref >60)
GFR calc non Af Amer: >60 mL/min (ref >60)
Glucose, Bld: 105 mg/dL — ABNORMAL HIGH (ref 70–99)
Potassium: 3.8 mmol/L (ref 3.5–5.1)
Sodium: 134 mmol/L — ABNORMAL LOW (ref 135–145)
Total Bilirubin: 3.4 mg/dL — ABNORMAL HIGH (ref 0.3–1.2)
Total Protein: 6 g/dL — ABNORMAL LOW (ref 6.5–8.1)

## 2019-04-23 LAB — CBC
HCT: 36.1 % (ref 36.0–46.0)
Hemoglobin: 11.6 g/dL — ABNORMAL LOW (ref 12.0–15.0)
MCH: 25.8 pg — ABNORMAL LOW (ref 26.0–34.0)
MCHC: 32.1 g/dL (ref 30.0–36.0)
MCV: 80.2 fL (ref 80.0–100.0)
Platelets: 217 10*3/uL (ref 150–400)
RBC: 4.5 MIL/uL (ref 3.87–5.11)
RDW: 22.6 % — ABNORMAL HIGH (ref 11.5–15.5)
WBC: 10.1 10*3/uL (ref 4.0–10.5)
nRBC: 0 % (ref 0.0–0.2)

## 2019-04-23 LAB — PROCALCITONIN: Procalcitonin: 0.2 ng/mL

## 2019-04-23 IMAGING — US US CHEST/MEDIASTINUM
1 series · 3 of 3 positions shown · non-contrast
Comparison: Chest radiograph [DATE]

CLINICAL DATA: Shortness of breath, breast cancer, BILATERAL
pleural effusions, post LEFT thoracentesis 4 days ago, for RIGHT
thoracentesis

EXAM:
CHEST ULTRASOUND

[Series 1: us chest/mediastinum · 3 of 3 slices shown]
[im 1/3]
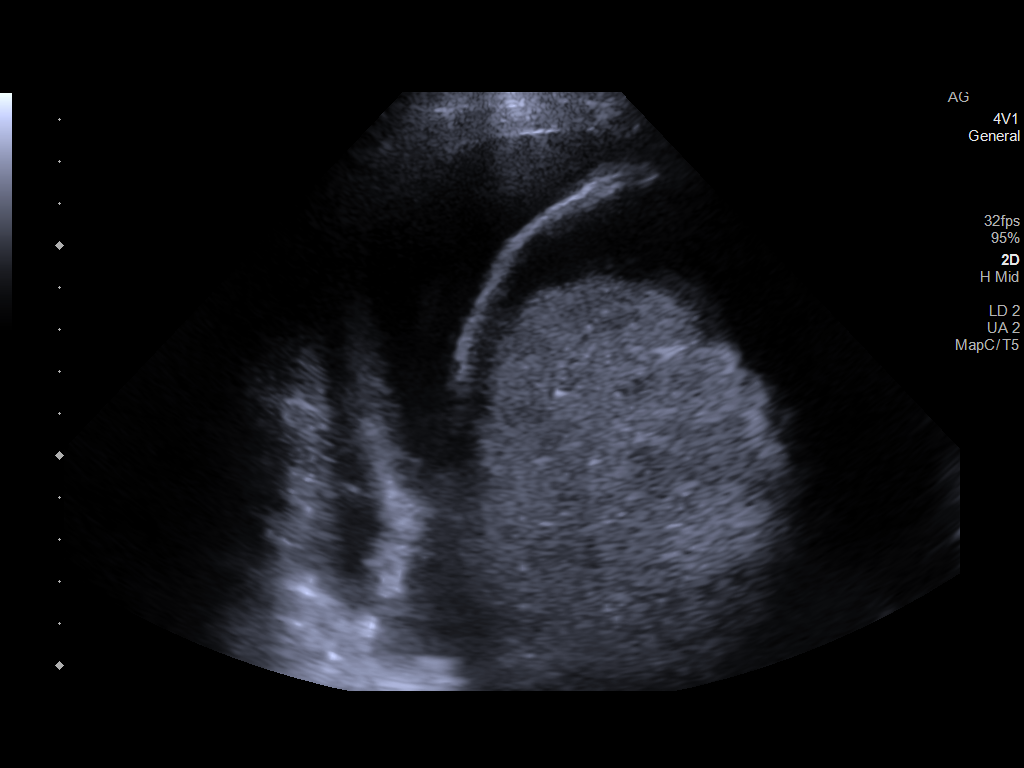
[im 2/3]
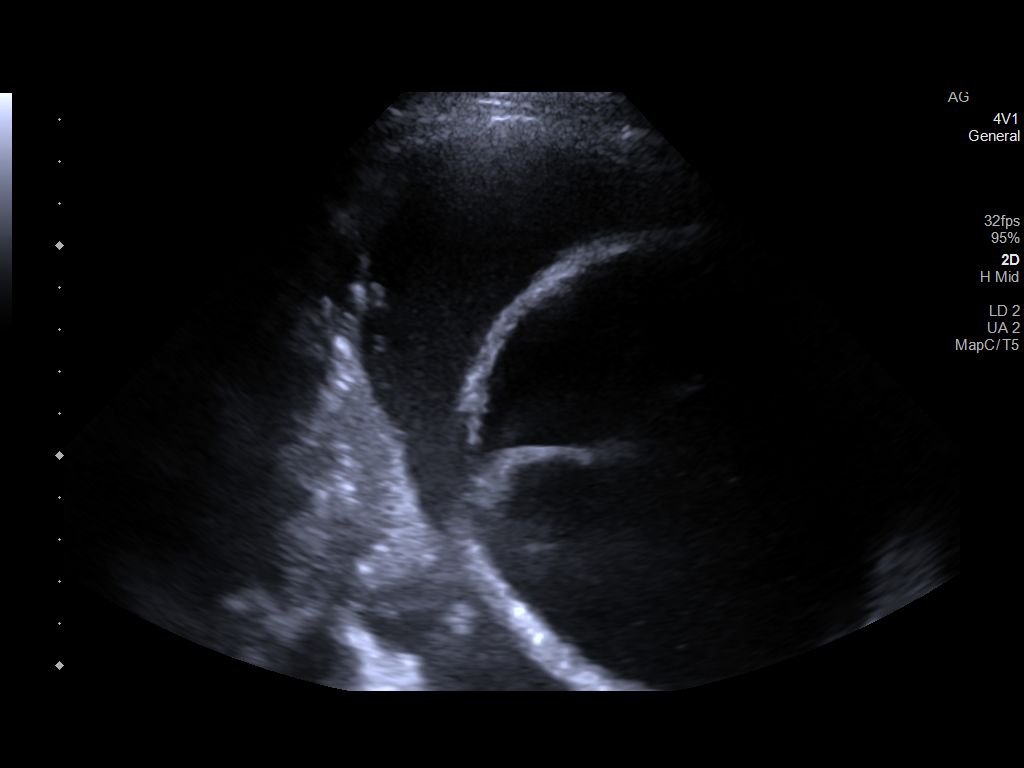
[im 3/3]
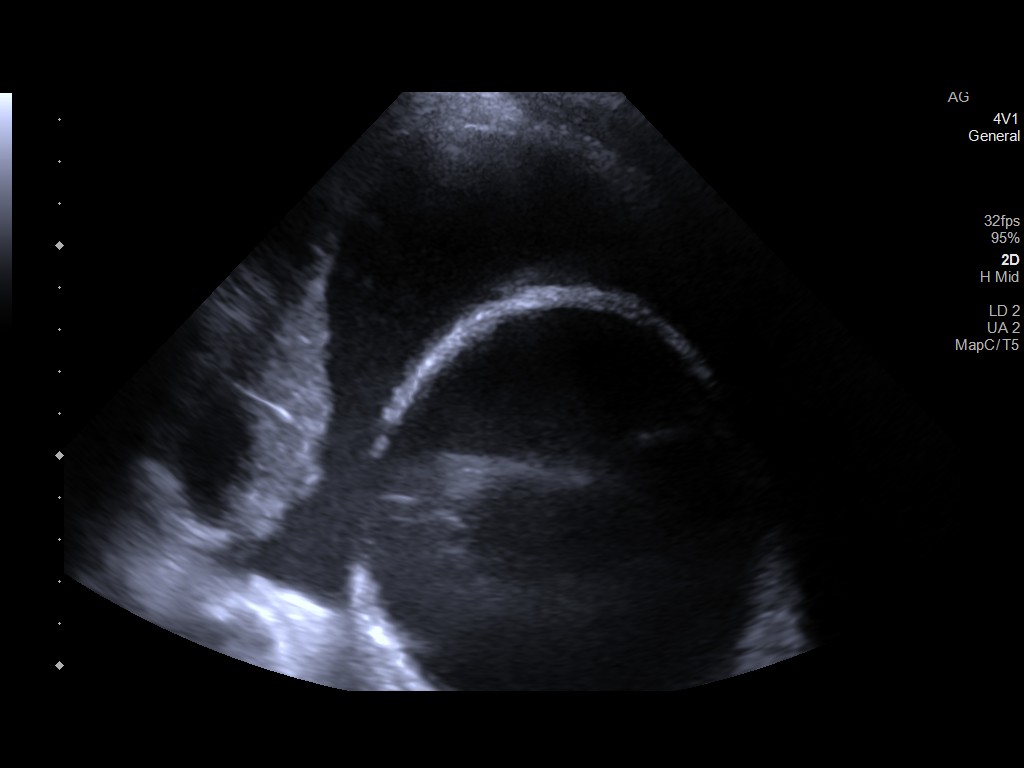

[3 of 3 positions shown; findings below may reference images not displayed]

FINDINGS: Small RIGHT pleural effusion identified.

Atelectasis of RIGHT lower lobe.

Significant elevation of RIGHT diaphragm due to significant ascites
in the abdomen.

Nodular liver with multiple hepatic metastases.
IMPRESSION: Small RIGHT pleural effusion is seen but more significant is
presence of large volume ascites in the abdomen, likely source of
patient's symptoms.

Discussed with Dr. BALABARCA and will proceed with paracentesis rather
than RIGHT thoracentesis.

## 2019-04-23 NOTE — Procedures (Signed)
PreOperative Dx: ascites Postoperative Dx: ascites Procedure:   US guided paracentesis Radiologist:  Thornton Papas Anesthesia:  10 ml of1% lidocaine Specimen:  4.7 L of yellow ascitic fluid EBL:   < 1 ml Complications: None

## 2019-04-23 NOTE — Sedation Documentation (Signed)
Removed 4.7 liters peritoneal fluid.  Pt tolerated well.

## 2019-04-23 NOTE — Sedation Documentation (Addendum)
Checking with Dr. Roderic Palau to do a paracentesis instead of a thoracentesis due to [place] results .

## 2019-04-23 NOTE — Progress Notes (Signed)
PROGRESS NOTE    Pamela Long  [id number] DOB: Dec 30, 1970 DOA: 04/19/2019 PCP: Doree Albee, MD    Brief Narrative:  48 year old female who presented with cough and dyspnea.  She does have significant past medical history for metastatic breast cancer, depression and asthma.  She reported worsening productive cough, associated with difficulty breathing for about a week.  Positive pleuritic chest pain.  On her initial physical examination her heart rate was 119, oxygen saturation 89% on room air, blood pressure 131/86, respiratory rate 19.  Her lungs were clear to auscultation bilaterally, heart S1-S2 present, tachycardic, abdomen soft, trace lower extremity edema more left than right SARS COVID-19 negative.  Chest radiograph with faint infiltrate right upper lobe, atelectasis left lower lobe with small pleural effusion.   Patient was admitted to the hospital with the working diagnosis of acute hypoxic respiratory failure due to community-acquired pneumonia, present on admission.  She has been responding well to antibiotic therapy with levofloxacin. Underwent left pleural effusion thoracentesis with 980 fluid obtained.   Assessment & Plan:   Principal Problem:   Pneumonia Active Problems:   Asthma   Depression   Metastatic breast cancer (HCC)   Respiratory distress   Malignant ascites   Malignant pleural effusion   1. Acute hypoxic respiratory failure due to right upper lobe community acquired pneumonia, complicated with left plural effusion. Patient complained of dyspnea and pleuritic chest pain, oximetry is 97% on 2 LPM per [place]. US guided thoracentesis to the left with 980 ml of yellow fluid.  Fluid analysis indicates transudate of process. Will continue antibiotic therapy with levofloxacin for possible pneumonia. Continue bronchodilator therapy.  Right-sided pleural effusion was evaluated and was not felt to be significant enough to attempt thoracentesis.  It was noted that  she has significant ascites and underwent paracentesis which will likely improve her shortness of breath.  2. Metastatic breast cancer/ liver metastais/elevated LFTs. On chemotherapy as outpatient, will follow as outpatient.  LFTs were elevated on admission.  She recently underwent stent placement by Dr. Laural Golden in 03/2019.  Since LFTs are trending back up, will consult gastroenterology for further input.  3. Hypokalemia and hypomagnesemia.  Replaced. Renal function stale with serum cr at 0.5.   4. HTN.  Blood pressure stable on metoprolol  5. Asthma. Stable with no signs of exacerbation.   6.  Ascites.  Likely related to underlying liver disease.  Patient has undergone paracentesis x2 during her hospital stay.   DVT prophylaxis: enoxaparin   Code Status:  full Family Communication: no family at the bedside  Disposition Plan/ discharge barriers:  Pending clinical improvement.   Body mass index is 27.12 kg/m. Malnutrition Type:  Nutrition Problem: Severe Malnutrition Etiology: cancer and cancer related treatments(breast cancer with mets to liver; last chemotherapy 10/15)   Malnutrition Characteristics:  Signs/Symptoms: percent weight loss, moderate fat depletion, severe fat depletion, moderate muscle depletion, severe muscle depletion, edema, energy intake < or equal to 75% for > or equal to 1 month, per patient/family report Percent weight loss: 12.6 %(23.5 lbs x 3 months)   Nutrition Interventions:  Interventions: Boost Breeze, Education  RN Pressure Injury Documentation:     Consultants:   Oncology  Interventional radiology   Gastroenterology  Procedures:   11/6 paracentesis with removal of 4.9 L of fluid  11/11 left thoracentesis with removal of 980 mL of fluid  11/13 paracentesis with removal of 4.7 L of fluid  Antimicrobials:       Subjective: Complains of persistent  nausea and vomiting.  Difficult to keep anything down.  Overall shortness of breath  is better after paracentesis.  Objective: Vitals:   04/23/19 1316 04/23/19 1340 04/23/19 1358 04/23/19 1427  BP: 100/82 107/65 107/65 (!) 107/53  Pulse: 95 97 95 (!) 101  Resp: 18 16 18 18   Temp:    98 F (36.7 C)  TempSrc:      SpO2: 100% 100% 100% 100%  Weight:      Height:       No intake or output data in the 24 hours ending 04/23/19 1927 Filed Weights   04/19/19 1521  Weight: 73.9 kg    Examination:   General exam: Alert, awake, oriented x 3 Respiratory system: Clear to auscultation. Respiratory effort normal. Cardiovascular system:RRR. No murmurs, rubs, gallops. Gastrointestinal system: Abdomen is distended, soft and diffusely tender. No organomegaly or masses felt. Normal bowel sounds heard. Central nervous system: Alert and oriented. No focal neurological deficits. Extremities: No C/C/E, +pedal pulses Skin: No rashes, lesions or ulcers Psychiatry: Judgement and insight appear normal. Mood & affect appropriate.   Data Reviewed: I have personally reviewed following labs and imaging studies  CBC: Recent Labs  Lab 04/19/19 1533 04/20/19 0444 04/21/19 0612 04/22/19 0503 04/23/19 0420  WBC 10.1 9.3 9.8 9.5 10.1  NEUTROABS 8.2*  --   --  7.6  --   HGB 11.6* 10.7* 10.8* 11.1* 11.6*  HCT 35.4* 33.5* 34.1* 34.5* 36.1  MCV 80.3 80.9 81.6 81.6 80.2  PLT 183 187 190 212 [id number]   Basic Metabolic Panel: Recent Labs  Lab 04/19/19 1533 04/20/19 0444 04/21/19 0612 04/22/19 0503 04/23/19 0420  NA 131* 135 135 135 134*  K 2.9* 3.1* 4.0 4.0 3.8  CL 96* 97* 100 100 99  CO2 25 27 27 25 25   GLUCOSE 112* 85 98 101* 105*  BUN 7 7 9 10 9   CREATININE 0.52 0.53 0.52 0.57 0.59  CALCIUM 8.3* 8.1* 8.3* 8.5* 8.5*  MG 1.5* 2.1 1.8 1.9  --    GFR: Estimated Creatinine Clearance: 84.7 mL/min (by C-G formula based on SCr of 0.59 mg/dL). Liver Function Tests: Recent Labs  Lab 04/19/19 1533 04/20/19 0444 04/21/19 0612 04/23/19 0420  AST 170* 144* 126* 185*  ALT 62* 52* 44  47*  ALKPHOS 449* 393* 383* 508*  BILITOT 4.3* 3.7* 3.1* 3.4*  PROT 6.0* 5.6* 5.0* 6.0*  ALBUMIN 2.4* 2.1* 2.0* 2.1*   Recent Labs  Lab 04/19/19 1533  LIPASE 15   No results for input(s): AMMONIA in the last 168 hours. Coagulation Profile: No results for input(s): INR, PROTIME in the last 168 hours. Cardiac Enzymes: No results for input(s): CKTOTAL, CKMB, CKMBINDEX, TROPONINI in the last 168 hours. BNP (last 3 results) No results for input(s): PROBNP in the last 8760 hours. HbA1C: No results for input(s): HGBA1C in the last 72 hours. CBG: No results for input(s): GLUCAP in the last 168 hours. Lipid Profile: No results for input(s): CHOL, HDL, LDLCALC, TRIG, CHOLHDL, LDLDIRECT in the last 72 hours. Thyroid Function Tests: No results for input(s): TSH, T4TOTAL, FREET4, T3FREE, THYROIDAB in the last 72 hours. Anemia Panel: No results for input(s): VITAMINB12, FOLATE, FERRITIN, TIBC, IRON, RETICCTPCT in the last 72 hours.    Radiology Studies: I have reviewed all of the imaging during this hospital visit personally     Scheduled Meds:  Chlorhexidine Gluconate Cloth  6 each Topical Daily   enoxaparin (LOVENOX) injection  40 mg Subcutaneous Q24H   feeding supplement  1 Container Oral TID BM   gabapentin  300 mg Oral QHS   lubiprostone  8 mcg Oral BID WC   metoprolol tartrate  12.5 mg Oral BID   pantoprazole  40 mg Oral q morning - 10a   Continuous Infusions:  levofloxacin (LEVAQUIN) IV 750 mg (04/23/19 1826)     LOS: 4 days        Kathie Dike, MD

## 2019-04-24 DIAGNOSIS — R180 Malignant ascites: Secondary | ICD-10-CM

## 2019-04-24 DIAGNOSIS — J189 Pneumonia, unspecified organism: Secondary | ICD-10-CM

## 2019-04-24 DIAGNOSIS — J910 Malignant pleural effusion: Secondary | ICD-10-CM

## 2019-04-24 DIAGNOSIS — R112 Nausea with vomiting, unspecified: Secondary | ICD-10-CM

## 2019-04-24 DIAGNOSIS — C50919 Malignant neoplasm of unspecified site of unspecified female breast: Secondary | ICD-10-CM

## 2019-04-24 DIAGNOSIS — R945 Abnormal results of liver function studies: Secondary | ICD-10-CM

## 2019-04-24 LAB — COMPREHENSIVE METABOLIC PANEL
ALT: 41 U/L (ref 0–44)
AST: 152 U/L — ABNORMAL HIGH (ref 15–41)
Albumin: 1.9 g/dL — ABNORMAL LOW (ref 3.5–5.0)
Alkaline Phosphatase: 458 U/L — ABNORMAL HIGH (ref 38–126)
Anion gap: 10 (ref 5–15)
BUN: 10 mg/dL (ref 6–20)
CO2: 26 mmol/L (ref 22–32)
Calcium: 8.5 mg/dL — ABNORMAL LOW (ref 8.9–10.3)
Chloride: 98 mmol/L (ref 98–111)
Creatinine, Ser: 0.47 mg/dL (ref 0.44–1.00)
GFR calc Af Amer: >60 mL/min (ref >60)
GFR calc non Af Amer: >60 mL/min (ref >60)
Glucose, Bld: 78 mg/dL (ref 70–99)
Potassium: 3.7 mmol/L (ref 3.5–5.1)
Sodium: 134 mmol/L — ABNORMAL LOW (ref 135–145)
Total Bilirubin: 4 mg/dL — ABNORMAL HIGH (ref 0.3–1.2)
Total Protein: 5.4 g/dL — ABNORMAL LOW (ref 6.5–8.1)

## 2019-04-24 MED ORDER — METOCLOPRAMIDE HCL 5 MG/ML IJ SOLN
5.0000 mg | Freq: Three times a day (TID) | INTRAMUSCULAR | Status: DC
  Administered 2019-04-24 – 2019-04-25 (×2): 5 mg via INTRAVENOUS
  Filled 2019-04-24 (×3): qty 2

## 2019-04-24 MED ORDER — METOCLOPRAMIDE HCL 5 MG/ML IJ SOLN
10.0000 mg | Freq: Once | INTRAMUSCULAR | Status: AC
  Administered 2019-04-24: 23:00:00 10 mg via INTRAVENOUS
  Filled 2019-04-24: qty 2

## 2019-04-24 MED ORDER — PANTOPRAZOLE SODIUM 40 MG IV SOLR
40.0000 mg | INTRAVENOUS | Status: DC
  Administered 2019-04-25 – 2019-04-26 (×2): 40 mg via INTRAVENOUS
  Filled 2019-04-24 (×2): qty 40

## 2019-04-24 NOTE — Consult Note (Signed)
Referring Provider: Kathie Dike, MD Primary Care Physician:  Doree Albee, MD Primary Gastroenterologist:  Dr. 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  HPI:   Patient is 48 year old Caucasian female who was diagnosed with breast carcinoma about about 3 years ago treated with chemotherapy followed by lumpectomy(margins not clear) and mastectomy and she remains on antiestrogen therapy.  She presented in December with right upper quadrant abdominal pain.  CT revealed multiple liver masses.  Liver biopsy in January 2020 confirmed metastatic disease.  He has been receiving chemotherapy under supervision of Dr. [name].  She presented in April with obstructive jaundice.  Imaging studies including MRCP revealed widespread liver mass but she also had dilated biliary system with complete obstruction felt to be due to metastatic disease.  She underwent ERCP on 09/22/2018 with placement of plastic biliary stent.  Her jaundice resolved and she was able to proceed with chemotherapy and seem to respond.  She had plastic stent removed on 03/11/2019 and fully covered Wallstent was placed.  Patient says she did fine for about 2 weeks and then she has been having intermittent nausea vomiting and epigastric pain.  She required paracenteses on 12/24/2018, February 12, 2019 and again on 04/16/2019.  She underwent left thoracenteses on 12/14/2018.  He presented to emergency room on 04/19/2019 with shortness of breath of over a week as well as cough but no fever or chills.  She has continued to experience intermittent nausea and vomiting.  She also has been experiencing frequent heartburn but she says since she has been hospitalized heartburn has been mild.  Last film on admission revealed bilateral pleural effusions and patchy opacity involving right upper lobe.  She underwent left  thoracenteses 3 days ago removal of 9 8 0 mL of fluid and she underwent abdominal paracentesis yesterday with removal of 4.7 L of fluid.  She was begun on Levaquin. He continues to have nausea and vomiting.  She says cough has decreased.  She continues to feel weak.  She gets short of breath when she walks to the bathroom which is only with assistance.  She vomited after eating a few bites of her breakfast.  She says it was mainly liquid with few small food particles.  No hematemesis melena or rectal bleeding.  She has been constipated.  Last bowel movement was 4 days ago.  She says she is losing muscle mass.  She is having intermittent pain in epigastric region as well as right upper and lower quadrant.  Patient is single.  She lives with a daughter who is 27 years old. She has never smoked cigarettes or drank alcohol. Family history is positive for breast carcinoma in maternal aunts, first cousin on other side.  She states her great aunt on mother side also had breast cancer and she has a first cousin on mother side with cervical cancer. 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  She says when she was diagnosed with breast carcinoma she was essentially pushed out. Her mother is 53 years old and has COPD.  Father died of CVA at age 36.  She has a brother but she has no contact with him.     Past Medical History:  Diagnosis Date  . Anemia   . Anxiety   . Asthma   . Breast cancer ([place])    Left,  0000000  . Complication of anesthesia   . Depression   . GERD (gastroesophageal reflux disease)   . Migraine   . OAB (overactive bladder)   . PONV (postoperative nausea and vomiting)   . Seasonal allergies     Past Surgical History:  Procedure Laterality Date  . ABDOMINAL HYSTERECTOMY     breast cancer  . ANKLE RECONSTRUCTION Right 1989  . BILIARY STENT PLACEMENT N/A 09/22/2018   Procedure: BILIARY STENT PLACEMENT;  Surgeon: Rogene Houston, MD;  [facility];  Service: Endoscopy;  Laterality: N/A;  . BILIARY STENT PLACEMENT N/A 03/16/2019   Procedure: BILIARY STENT EXCHANGE;  Surgeon: Rogene Houston, MD;  Location: [facility];  Service: Endoscopy;  Laterality: N/A;  . BREAST SURGERY    . COLONOSCOPY WITH PROPOFOL N/A 08/06/2018   Dr. Oneida Alar: small internal hemorrhoids, moderate external hemorrhoids. rectal bleeding due to ?anal fissure vs hemorrhoids. nex tcs 10 years  . ERCP N/A 09/22/2018   Procedure: ENDOSCOPIC RETROGRADE CHOLANGIOPANCREATOGRAPHY (ERCP);  Surgeon: Rogene Houston, MD;  [facility];  Service: Endoscopy;  Laterality: N/A;  . ERCP N/A 03/16/2019   Procedure: ENDOSCOPIC RETROGRADE CHOLANGIOPANCREATOGRAPHY (ERCP);  Surgeon: Rogene Houston, MD;  [facility];  Service: Endoscopy;  Laterality: N/A;  . FOREIGN BODY REMOVAL N/A 08/29/2017   from lip  . KNEE SURGERY Right 1989   bone spur  . MASTECTOMY  2017   bil mastectomies  . PORTACATH PLACEMENT Left 12/23/2018   Procedure: INSERTION PORT-A-CATH LEFT SUBCLAVIAN;  Surgeon: Aviva Signs, MD;  Location: [facility];  Service: General;  Laterality: Left;  . SINUSOTOMY      Prior to Admission medications   Medication Sig Start Date End Date Taking? Authorizing Provider  escitalopram (LEXAPRO) 20 MG tablet Take 1 tablet (20 mg total) by mouth daily. 02/12/17  Yes Raylene Everts, MD  furosemide (LASIX) 20 MG tablet TAKE 1 TABLET BY MOUTH EVERY DAY Patient taking differently: Take 20 mg by mouth daily.  04/13/19  Yes Lockamy, Randi L, NP-C  gabapentin (NEURONTIN) 300 MG capsule Take 1 capsule (300 mg total) by mouth at bedtime. 01/07/19  Yes Derek Jack, MD  lubiprostone (AMITIZA) 8 MCG capsule Take 1 capsule (8 mcg total) by mouth 2 (two) times daily with a meal. 03/02/19  Yes Mahala Menghini, PA-C  magnesium oxide (MAG-OX) 400 MG tablet TAKE 1 TABLET BY MOUTH EVERY DAY 12/21/18  Yes Lockamy, Randi L, NP-C  metoprolol tartrate (LOPRESSOR) 25 MG tablet  Take 0.5 tablets (12.5 mg total) by mouth 2 (two) times daily. 04/11/19 04/10/20 Yes Barton Dubois, MD  nystatin-triamcinolone [facility] II) cream Apply 1 application topically 2 (two) times daily.  02/24/19  Yes [provider]  oxyCODONE-acetaminophen (PERCOCET/ROXICET) 5-325 MG tablet Take 1 tablet by mouth every 4 (four) hours as needed for severe pain. 04/19/19  Yes Lockamy, Randi L, NP-C  PACLitaxel (TAXOL IV) Inject into the vein every 14 (fourteen) days.    Yes [provider]  pantoprazole (PROTONIX) 40 MG tablet TAKE 1 TABLET BY MOUTH EVERY DAY BEFORE BREAKFAST Patient taking differently: Take 40 mg by mouth every morning.  02/18/19  Yes Derek Jack, MD  potassium chloride (KLOR-CON) 10 MEQ tablet Take 10 mEq by mouth 2 (two) times daily.   Yes [provider]  prochlorperazine (COMPAZINE) 10 MG tablet Take 1 tablet (10 mg total) by mouth every 6 (six) hours as needed for nausea or vomiting. 04/08/19  Yes Derek Jack,  MD  promethazine (PHENERGAN) 25 MG tablet Take 1 tablet (25 mg total) by mouth every 8 (eight) hours as needed for refractory nausea / vomiting. 04/15/19  Yes Derek Jack, MD  scopolamine (TRANSDERM-SCOP) 1 MG/3DAYS Place 1 patch (1.5 mg total) onto the skin every 3 (three) days. 01/18/19  Yes Derek Jack, MD  spironolactone (ALDACTONE) 25 MG tablet TAKE 1 TABLET BY MOUTH EVERY DAY Patient taking differently: Take 25 mg by mouth daily.  04/13/19  Yes Lockamy, Randi L, NP-C  temazepam (RESTORIL) 15 MG capsule TAKE 1 CAPSULE (15 MG TOTAL) BY MOUTH AT BEDTIME AS NEEDED FOR SLEEP. Patient taking differently: Take 15 mg by mouth at bedtime.  02/22/19  Yes Derek Jack, MD    Current Facility-Administered Medications  Medication Dose Route Frequency Provider Last Rate Last Dose  . acetaminophen (TYLENOL) tablet 650 mg  650 mg Oral Q6H PRN Emokpae, Ejiroghene E, MD   650 mg at 04/21/19 0522   Or  . acetaminophen  (TYLENOL) suppository 650 mg  650 mg Rectal Q6H PRN Emokpae, Ejiroghene E, MD      . albuterol (PROVENTIL) (2.5 MG/3ML) 0.083% nebulizer solution 3 mL  3 mL Inhalation Q4H PRN Emokpae, Ejiroghene E, MD      . alum & mag hydroxide-simeth (MAALOX/MYLANTA) 200-200-20 MG/5ML suspension 30 mL  30 mL Oral Q4H PRN Manuella Ghazi, Pratik D, DO   30 mL at 04/22/19 1133  . Chlorhexidine Gluconate Cloth 2 % PADS 6 each  6 each Topical Daily Arrien, Jimmy Picket, MD   6 each at 04/24/19 0920  . enoxaparin (LOVENOX) injection 40 mg  40 mg Subcutaneous Q24H Emokpae, Ejiroghene E, MD   40 mg at 04/23/19 2247  . feeding supplement (BOOST / RESOURCE BREEZE) liquid 1 Container  1 Container Oral TID BM Manuella Ghazi, Pratik D, DO   1 Container at 04/24/19 0920  . gabapentin (NEURONTIN) capsule 300 mg  300 mg Oral QHS Shah, Pratik D, DO   300 mg at 04/23/19 2245  . guaiFENesin-dextromethorphan (ROBITUSSIN DM) 100-10 MG/5ML syrup 10 mL  10 mL Oral Q4H PRN Arrien, Jimmy Picket, MD   10 mL at 04/22/19 1355  . levofloxacin (LEVAQUIN) IVPB 750 mg  750 mg Intravenous Q24H Emokpae, Ejiroghene E, MD 100 mL/hr at 04/23/19 1826 750 mg at 04/23/19 1826  . lubiprostone (AMITIZA) capsule 8 mcg  8 mcg Oral BID WC Manuella Ghazi, Pratik D, DO   8 mcg at 04/24/19 0920  . metoCLOPramide (REGLAN) injection 5 mg  5 mg Intravenous TID AC Oreoluwa Aigner U, MD      . metoprolol tartrate (LOPRESSOR) tablet 12.5 mg  12.5 mg Oral BID Manuella Ghazi, Pratik D, DO   12.5 mg at 04/24/19 [id number]  . morphine 2 MG/ML injection 2 mg  2 mg Intravenous Q1H PRN Manuella Ghazi, Pratik D, DO   2 mg at 04/24/19 1048  . oxyCODONE-acetaminophen (PERCOCET/ROXICET) 5-325 MG per tablet 1 tablet  1 tablet Oral Q6H PRN Emokpae, Ejiroghene E, MD   1 tablet at 04/22/19 1355  . pantoprazole (PROTONIX) injection 40 mg  40 mg Intravenous Q24H Skyleen Bentley U, MD      . polyethylene glycol (MIRALAX / GLYCOLAX) packet 17 g  17 g Oral Daily PRN Emokpae, Ejiroghene E, MD   17 g at 04/22/19 1134  . promethazine  (PHENERGAN) injection 12.5 mg  12.5 mg Intravenous Q6H PRN Kathie Dike, MD   12.5 mg at 04/24/19 1034  . temazepam (RESTORIL) capsule 15 mg  15 mg Oral QHS PRN  Bethena Roys, MD   15 mg at 04/23/19 2245   Facility-Administered Medications Ordered in Other Encounters  Medication Dose Route Frequency Provider Last Rate Last Dose  . sodium chloride flush (NS) 0.9 % injection 10 mL  10 mL Intracatheter PRN Derek Jack, MD        Allergies as of 04/19/2019 - Review Complete 04/19/2019  Allergen Reaction Noted  . Salagen [pilocarpine] Other (See Comments) 09/21/2018  . Tape Itching and Other (See Comments) 06/17/2018  . Amoxicillin Hives and Other (See Comments) 02/12/2017  . Caffeine Diarrhea, Nausea Only, Palpitations, and Other (See Comments) 01/16/2017  . Tetanus toxoids Swelling and Other (See Comments) 02/12/2017    Family History  Problem Relation Age of Onset  . Arthritis Mother   . COPD Mother   . Depression Mother   . Diabetes Mother   . Kidney disease Father   . Heart disease Father 21  . Drug abuse Father   . Hypertension Father   . Diabetes Daughter   . Hashimoto's thyroiditis Daughter   . Irritable bowel syndrome Daughter   . Autism spectrum disorder Daughter   . Early death Maternal Grandmother        drowned  . Early death Maternal Grandfather   . Heart disease Maternal Grandfather   . Heart disease Paternal Grandfather   . Breast cancer Maternal Aunt   . Breast cancer Cousin   . Lung cancer Maternal Aunt   . Lung cancer Maternal Uncle   . Colon cancer Neg Hx     Social History   Socioeconomic History  . Marital status: Single    Spouse name: Not on file  . Number of children: 1  . Years of education: 34  . Highest education level: Not on file  Occupational History  . Occupation: disabled  Social Needs  . Financial resource strain: Somewhat hard  . Food insecurity    Worry: Sometimes true    Inability: Sometimes true  .  Transportation needs    Medical: No    Non-medical: No  Tobacco Use  . Smoking status: Never Smoker  . Smokeless tobacco: Never Used  Substance and Sexual Activity  . Alcohol use: No  . Drug use: No  . Sexual activity: Not Currently  Lifestyle  . Physical activity    Days per week: 0 days    Minutes per session: 0 min  . Stress: Rather much  Relationships  . Social Herbalist on phone: Once a week    Gets together: Once a week    Attends religious service: 1 to 4 times per year    Active member of club or organization: Yes    Attends meetings of clubs or organizations: Never    Relationship status: Never married  . Intimate partner violence    Fear of current or ex partner: No    Emotionally abused: No    Physically abused: No    Forced sexual activity: No  Other Topics Concern  . Not on file  Social History Narrative   Bachelors degree   Accounting   Lives with daughter [name] who has autism and diabetes   Likes to sew, quilt, crafts, crochet    Review of Systems: See HPI, otherwise normal ROS  Physical Exam: Temp:  [98 F (36.7 C)] 98 F (36.7 C) (11/13 1427) Pulse Rate:  [95-109] 103 (11/14 0919) Resp:  [16-18] 18 (11/13 1427) BP: (88-110)/(43-82) 110/43 (11/14 0919) SpO2:  [96 %-100 %]  99 % (11/14 0419) Last BM Date: 04/17/19  Patient is alert and in no acute distress but she appears to be sick. Conjunctiva is pink.  Sclera is mildly icteric. Oropharyngeal mucosa is normal.  Dentition in satisfactory condition. Neck without thyromegaly lymphadenopathy or masses. Examination of chest reveals PowerPort in left pectoral region.  Cardiac exam with regular rhythm normal S1 and S2.  No murmur gallop noted. Auscultation of lungs reveal vesicular breath sounds which are diminished at both bases. Abdomen is symmetrical.  Bowel sounds are normal.  She has mild tenderness in epigastrium right upper and right lower quadrant.  No organomegaly or masses. Has  trace edema around ankles. Nails involving all fingers and thumb course and irregular.  Lab Results: Recent Labs    04/22/19 0503 04/23/19 0420  WBC 9.5 10.1  HGB 11.1* 11.6*  HCT 34.5* 36.1  PLT 212 217   BMET Recent Labs    04/22/19 0503 04/23/19 0420 04/24/19 0650  NA 135 134* 134*  K 4.0 3.8 3.7  CL 100 99 98  CO2 25 25 26   GLUCOSE 101* 105* 78  BUN 10 9 10   CREATININE 0.57 0.59 0.47  CALCIUM 8.5* 8.5* 8.5*   LFT Recent Labs    04/24/19 0650  PROT 5.4*  ALBUMIN 1.9*  AST 152*  ALT 41  ALKPHOS 458*  BILITOT 4.0*    Studies/Results: Dg Chest 2 View  Result Date: 04/22/2019 CLINICAL DATA:  Patient admitted for pneumonia.  Worsening cough. EXAM: CHEST - 2 VIEW COMPARISON:  April 21, 2019 FINDINGS: Increased interstitial markings, right greater than left. Probable small effusions with atelectasis. Chronic elevation of the right hemidiaphragm. Stable cardiomediastinal silhouette. Increased density in the right hilar region is stable, possibly atelectasis. The left Port-A-Cath is stable. IMPRESSION: 1. Increased interstitial markings may represent atypical infection versus asymmetric edema. 2. Increased density in the right hilum is stable, possibly atelectasis. Recommend attention on follow-up. 3. Probable small effusions. Electronically Signed   By: Dorise Bullion III M.D   On: 04/22/2019 18:21   [place] Chest (pleural Effusion)  Result Date: 04/23/2019 CLINICAL DATA:  Shortness of breath, breast cancer, BILATERAL pleural effusions, post LEFT thoracentesis 4 days ago, for RIGHT thoracentesis EXAM: CHEST ULTRASOUND COMPARISON:  Chest radiograph 04/22/2019 FINDINGS: Small RIGHT pleural effusion identified. Atelectasis of RIGHT lower lobe. Significant elevation of RIGHT diaphragm due to significant ascites in the abdomen. Nodular liver with multiple hepatic metastases. IMPRESSION: Small RIGHT pleural effusion is seen but more significant is presence of large volume ascites  in the abdomen, likely source of patient's symptoms. Discussed with Dr. Roderic Palau and will proceed with paracentesis rather than RIGHT thoracentesis. Electronically Signed   By: Lavonia Dana M.D.   On: 04/23/2019 14:01   US Paracentesis  Result Date: 04/23/2019 INDICATION: Ascites, breast cancer, shortness of breath EXAM: ULTRASOUND GUIDED  PARACENTESIS MEDICATIONS: None COMPLICATIONS: None immediate PROCEDURE: Informed written consent was obtained from the patient after a discussion of the risks, benefits and alternatives to treatment. A timeout was performed prior to the initiation of the procedure. Initial ultrasound scanning demonstrates a large amount of ascites within the right lower abdominal quadrant. The right lower abdomen was prepped and draped in the usual sterile fashion. 1% lidocaine was used for local anesthesia. Following this, a 5 [place] Yueh catheter was introduced. An ultrasound image was saved for documentation purposes. The paracentesis was performed. The catheter was removed and a dressing was applied. The patient tolerated the procedure well without immediate post procedural  complication. Patient received post-procedure intravenous albumin; see nursing notes for details. FINDINGS: A total of approximately 4.7 L of yellow ascitic fluid was removed. IMPRESSION: Successful ultrasound-guided paracentesis yielding 4.7 liters of peritoneal fluid. Electronically Signed   By: Lavonia Dana M.D.   On: 04/23/2019 14:33   Abdominal pelvic CT from 04/10/2019 reviewed.  Biliary stent is in good position.  There is subtle intrahepatic biliary dilation to lateral segment and left lobe.  Multiple low-density liver masses noted largest 1 of which measured 9.1 x 6.4 cm.  She has mild gallbladder wall thickening and enhancement and moderate to large amount of ascites.  Assessment;  Patient is an unfortunate 48 year old Caucasian female with metastatic breast carcinoma who underwent biliary stenting back in  April 2020 for biliary obstruction with resolution of her jaundice.  She has known extensive hepatic disease.  She underwent stent change 6 weeks ago when plastic stent was replaced by fully covered Wallstent.  Bilirubin and transaminases are normal about 4 weeks ago.  She now presents with nausea vomiting abdominal pain as well as cough.  She has developed bilateral pleural effusions as well as significant ascites.  She had abdominal paracenteses on 04/16/2019 and again yesterday.  She also had left thoracenteses with removal of 1 L of fluid 3 days ago.  Patient is on IV Levaquin and she remains with cough exertional dyspnea and profound weakness. Her LFTs have not significantly changed since hospitalization. Her GI issues can be summarized as follows.  #1.  Abnormal LFTs.  Review of CT from 2 weeks ago revealed Wallstent to be in good position.  Patient's bilirubin is not rapidly climbing.  Therefore I doubt that this stent has completely occluded which can occur given her condition.  It is possible that abnormal LFTs are secondary to extensive hepatic metastatic disease.  I do not see a pneumobilia which is bothersome.  She will need to be further evaluated with MRCP to determine whether she would benefit from another intervention i.e. ERCP with stent change.  If stent has to be changed I may consider uncovered stent or 2 plastic stents.  #2. Gallbladder wall thickening possibly due to hypoalbuminemia.  She could also have portal hypertension in addition to metastatic disease.  I do not believe she has acute cholecystitis..  #3.  Nausea and vomiting.  It remains to be seen if nausea and vomiting is due to widespread metastatic disease or biliary stent occlusion or secondary to GERD or narcotics.  #3.  Metastatic breast carcinoma.  It appears she is not responding to treatment anymore.  Dr. [name] is planning to change chemotherapy once she has improved and stable to proceed with  chemotherapy.  Recommendations;  Change pantoprazole to IV route since she is having intermittent vomiting and may be losing this medication Metoclopramide 5 mg IV before each meal. MRCP to be performed on 04/26/2019.  Please note patient would prefer not to go to [facility] to get MRCP today.   LOS: 5 days   Pamela Long  04/24/2019, 12:36 PM

## 2019-04-24 NOTE — Plan of Care (Signed)
Ambulates in room with standby assist, generalized weakness. Calls for assist and waits for help before getting up.

## 2019-04-24 NOTE — Progress Notes (Signed)
Patient called out to nurses desk complaining of nausea, given PRN phenergan as ordered. Patient was repositioned, no other needs at this time.

## 2019-04-24 NOTE — Progress Notes (Signed)
PROGRESS NOTE    Pamela Long  [id number] DOB: 1970/08/21 DOA: 04/19/2019 PCP: Doree Albee, MD    Brief Narrative:  48 year old female who presented with cough and dyspnea.  She does have significant past medical history for metastatic breast cancer, depression and asthma.  She reported worsening productive cough, associated with difficulty breathing for about a week.  Positive pleuritic chest pain.  On her initial physical examination her heart rate was 119, oxygen saturation 89% on room air, blood pressure 131/86, respiratory rate 19.  Her lungs were clear to auscultation bilaterally, heart S1-S2 present, tachycardic, abdomen soft, trace lower extremity edema more left than right SARS COVID-19 negative.  Chest radiograph with faint infiltrate right upper lobe, atelectasis left lower lobe with small pleural effusion.   Patient was admitted to the hospital with the working diagnosis of acute hypoxic respiratory failure due to community-acquired pneumonia, present on admission.  She has been responding well to antibiotic therapy with levofloxacin. Underwent left pleural effusion thoracentesis with 980 fluid obtained.   Assessment & Plan:   Principal Problem:   Pneumonia Active Problems:   Asthma   Depression   Metastatic breast cancer (HCC)   Respiratory distress   Malignant ascites   Malignant pleural effusion   1. Acute hypoxic respiratory failure due to right upper lobe community acquired pneumonia, complicated with left plural effusion. Patient complained of dyspnea and pleuritic chest pain, oximetry is 97% on 2 LPM per [place]. US guided thoracentesis to the left with 980 ml of yellow fluid.  Fluid analysis indicated transudate of process.  She is completed 5 days of intravenous levofloxacin for community-acquired pneumonia.  Will discontinue further antibiotics and she is now afebrile and overall respiratory status appears to be better.. Continue bronchodilator therapy.   Right-sided pleural effusion was evaluated and was not felt to be significant enough to attempt thoracentesis.  It was noted that she has significant ascites and underwent paracentesis which will likely improve her shortness of breath.  Overall respiratory status appears to be improving now that she is on room air.  2. Metastatic breast cancer/ liver metastais/elevated LFTs. On chemotherapy as outpatient, will follow as outpatient.  LFTs were elevated on admission.  She recently underwent stent placement by Dr. Laural Golden in 03/2019.  GI consulted, appreciate input.  Started on Reglan for nausea and vomiting.  May need to have MRCP done on Monday if symptoms are persisting.  3. Hypokalemia and hypomagnesemia.  Replaced. Renal function stale with serum cr at 0.5.   4. HTN.  Blood pressure stable on metoprolol  5. Asthma. Stable with no signs of exacerbation.   6.  Ascites.  Likely related to underlying liver disease.  Patient has undergone paracentesis x2 during her hospital stay.   DVT prophylaxis: enoxaparin   Code Status:  full Family Communication: no family at the bedside  Disposition Plan/ discharge barriers:  Pending clinical improvement.   Body mass index is 27.12 kg/m. Malnutrition Type:  Nutrition Problem: Severe Malnutrition Etiology: cancer and cancer related treatments(breast cancer with mets to liver; last chemotherapy 10/15)   Malnutrition Characteristics:  Signs/Symptoms: percent weight loss, moderate fat depletion, severe fat depletion, moderate muscle depletion, severe muscle depletion, edema, energy intake < or equal to 75% for > or equal to 1 month, per patient/family report Percent weight loss: 12.6 %(23.5 lbs x 3 months)   Nutrition Interventions:  Interventions: Boost Breeze, Education  RN Pressure Injury Documentation:     Consultants:   Oncology  Interventional radiology  Gastroenterology  Procedures:   11/6 paracentesis with removal of 4.9 L of  fluid  11/11 left thoracentesis with removal of 980 mL of fluid  11/13 paracentesis with removal of 4.7 L of fluid  Antimicrobials:   Levofloxacin 11/10 > 11/14   Subjective: Continues to have nausea and difficulty tolerating po intake. Shortness of breath is better  Objective: Vitals:   04/24/19 0425 04/24/19 0700 04/24/19 0919 04/24/19 1445  BP: (!) 102/43 (!) 101/56 (!) 110/43 107/69  Pulse:   (!) 103 (!) 117  Resp:    20  Temp:    98.3 F (36.8 C)  TempSrc:    Oral  SpO2:    95%  Weight:      Height:        Intake/Output Summary (Last 24 hours) at 04/24/2019 1852 Last data filed at 04/24/2019 1814 Gross per 24 hour  Intake 840 ml  Output -  Net 840 ml   Filed Weights   04/19/19 1521  Weight: 73.9 kg    Examination:   General exam: Alert, awake, oriented x 3 Respiratory system: Clear to auscultation. Respiratory effort normal. Cardiovascular system:RRR. No murmurs, rubs, gallops. Gastrointestinal system: Abdomen is nondistended, soft and nontender. No organomegaly or masses felt. Normal bowel sounds heard. Central nervous system: Alert and oriented. No focal neurological deficits. Extremities: No C/C/E, +pedal pulses Skin: No rashes, lesions or ulcers Psychiatry: Judgement and insight appear normal. Mood & affect appropriate.    Data Reviewed: I have personally reviewed following labs and imaging studies  CBC: Recent Labs  Lab 04/19/19 1533 04/20/19 0444 04/21/19 0612 04/22/19 0503 04/23/19 0420  WBC 10.1 9.3 9.8 9.5 10.1  NEUTROABS 8.2*  --   --  7.6  --   HGB 11.6* 10.7* 10.8* 11.1* 11.6*  HCT 35.4* 33.5* 34.1* 34.5* 36.1  MCV 80.3 80.9 81.6 81.6 80.2  PLT 183 187 190 212 [id number]   Basic Metabolic Panel: Recent Labs  Lab 04/19/19 1533 04/20/19 0444 04/21/19 0612 04/22/19 0503 04/23/19 0420 04/24/19 0650  NA 131* 135 135 135 134* 134*  K 2.9* 3.1* 4.0 4.0 3.8 3.7  CL 96* 97* 100 100 99 98  CO2 25 27 27 25 25 26   GLUCOSE 112* 85 98 101*  105* 78  BUN 7 7 9 10 9 10   CREATININE 0.52 0.53 0.52 0.57 0.59 0.47  CALCIUM 8.3* 8.1* 8.3* 8.5* 8.5* 8.5*  MG 1.5* 2.1 1.8 1.9  --   --    GFR: Estimated Creatinine Clearance: 84.7 mL/min (by C-G formula based on SCr of 0.47 mg/dL). Liver Function Tests: Recent Labs  Lab 04/19/19 1533 04/20/19 0444 04/21/19 0612 04/23/19 0420 04/24/19 0650  AST 170* 144* 126* 185* 152*  ALT 62* 52* 44 47* 41  ALKPHOS 449* 393* 383* 508* 458*  BILITOT 4.3* 3.7* 3.1* 3.4* 4.0*  PROT 6.0* 5.6* 5.0* 6.0* 5.4*  ALBUMIN 2.4* 2.1* 2.0* 2.1* 1.9*   Recent Labs  Lab 04/19/19 1533  LIPASE 15   No results for input(s): AMMONIA in the last 168 hours. Coagulation Profile: No results for input(s): INR, PROTIME in the last 168 hours. Cardiac Enzymes: No results for input(s): CKTOTAL, CKMB, CKMBINDEX, TROPONINI in the last 168 hours. BNP (last 3 results) No results for input(s): PROBNP in the last 8760 hours. HbA1C: No results for input(s): HGBA1C in the last 72 hours. CBG: No results for input(s): GLUCAP in the last 168 hours. Lipid Profile: No results for input(s): CHOL, HDL, LDLCALC, TRIG, CHOLHDL, LDLDIRECT  in the last 72 hours. Thyroid Function Tests: No results for input(s): TSH, T4TOTAL, FREET4, T3FREE, THYROIDAB in the last 72 hours. Anemia Panel: No results for input(s): VITAMINB12, FOLATE, FERRITIN, TIBC, IRON, RETICCTPCT in the last 72 hours.    Radiology Studies: I have reviewed all of the imaging during this hospital visit personally     Scheduled Meds: . Chlorhexidine Gluconate Cloth  6 each Topical Daily  . enoxaparin (LOVENOX) injection  40 mg Subcutaneous Q24H  . feeding supplement  1 Container Oral TID BM  . gabapentin  300 mg Oral QHS  . lubiprostone  8 mcg Oral BID WC  . metoCLOPramide (REGLAN) injection  5 mg Intravenous TID AC  . metoprolol tartrate  12.5 mg Oral BID  . pantoprazole (PROTONIX) IV  40 mg Intravenous Q24H   Continuous Infusions: . levofloxacin  (LEVAQUIN) IV 750 mg (04/24/19 1747)     LOS: 5 days        Kathie Dike, MD

## 2019-04-25 LAB — HEPATIC FUNCTION PANEL
ALT: 47 U/L — ABNORMAL HIGH (ref 0–44)
AST: 168 U/L — ABNORMAL HIGH (ref 15–41)
Albumin: 1.9 g/dL — ABNORMAL LOW (ref 3.5–5.0)
Alkaline Phosphatase: 511 U/L — ABNORMAL HIGH (ref 38–126)
Bilirubin, Direct: 2.3 mg/dL — ABNORMAL HIGH (ref 0.0–0.2)
Indirect Bilirubin: 2 mg/dL — ABNORMAL HIGH (ref 0.3–0.9)
Total Bilirubin: 4.3 mg/dL — ABNORMAL HIGH (ref 0.3–1.2)
Total Protein: 5.4 g/dL — ABNORMAL LOW (ref 6.5–8.1)

## 2019-04-25 MED ORDER — METOCLOPRAMIDE HCL 5 MG/ML IJ SOLN
10.0000 mg | Freq: Three times a day (TID) | INTRAMUSCULAR | Status: DC
  Administered 2019-04-25 – 2019-05-01 (×17): 10 mg via INTRAVENOUS
  Filled 2019-04-25 (×18): qty 2

## 2019-04-25 MED ORDER — BISACODYL 10 MG RE SUPP
10.0000 mg | Freq: Once | RECTAL | Status: AC
  Administered 2019-04-25: 09:00:00 10 mg via RECTAL
  Filled 2019-04-25: qty 1

## 2019-04-25 MED ORDER — ALBUMIN HUMAN 25 % IV SOLN
25.0000 g | Freq: Four times a day (QID) | INTRAVENOUS | Status: AC
  Administered 2019-04-25 – 2019-04-26 (×4): 25 g via INTRAVENOUS
  Filled 2019-04-25 (×4): qty 100

## 2019-04-25 NOTE — Progress Notes (Signed)
Subjective:  Patient does not feel any better.  She has less cough as long as she is in propped up position when she sleeps.  She does not have shortness of breath at rest.  She has not had a bowel movement in last 5 days.  She was just given a suppository and she is hoping she will have results.  Her appetite remains poor.  She continues to vomit.  She vomited after each meal yesterday.  Last evening she vomited fluid and food.  No hematemesis.  Pain across upper abdomen is intermittent and about the same.  Objective: Blood pressure 104/63, pulse 92, temperature 98.3 F (36.8 C), temperature source Oral, resp. rate 18, height 5' 5" (1.651 m), weight 73.9 kg, SpO2 96 %. Patient is alert and does not appear to be in acute distress Auscultation of lungs reveal vesicular breath sounds bilaterally. Abdomen is symmetrical.  Bowel sounds are normal.  On palpation abdomen is soft.  She has mild tenderness across upper abdomen which is may be slightly more pronounced in mid epigastric region.  No organomegaly or masses. No calf tenderness noted.  LE edema has increased since yesterday and it is more involving left foot and leg.  Labs/studies Results:  CBC Latest Ref Rng & Units 04/23/2019 04/22/2019 04/21/2019  WBC 4.0 - 10.5 K/uL 10.1 9.5 9.8  Hemoglobin 12.0 - 15.0 g/dL 11.6(L) 11.1(L) 10.8(L)  Hematocrit 36.0 - 46.0 % 36.1 34.5(L) 34.1(L)  Platelets 150 - 400 K/uL 217 212 190    CMP Latest Ref Rng & Units 04/25/2019 04/24/2019 04/23/2019  Glucose 70 - 99 mg/dL - 78 105(H)  BUN 6 - 20 mg/dL - 10 9  Creatinine 0.44 - 1.00 mg/dL - 0.47 0.59  Sodium 135 - 145 mmol/L - 134(L) 134(L)  Potassium 3.5 - 5.1 mmol/L - 3.7 3.8  Chloride 98 - 111 mmol/L - 98 99  CO2 22 - 32 mmol/L - 26 25  Calcium 8.9 - 10.3 mg/dL - 8.5(L) 8.5(L)  Total Protein 6.5 - 8.1 g/dL 5.4(L) 5.4(L) 6.0(L)  Total Bilirubin 0.3 - 1.2 mg/dL 4.3(H) 4.0(H) 3.4(H)  Alkaline Phos 38 - 126 U/L 511(H) 458(H) 508(H)  AST 15 - 41 U/L  168(H) 152(H) 185(H)  ALT 0 - 44 U/L 47(H) 41 47(H)    Hepatic Function Latest Ref Rng & Units 04/25/2019 04/24/2019 04/23/2019  Total Protein 6.5 - 8.1 g/dL 5.4(L) 5.4(L) 6.0(L)  Albumin 3.5 - 5.0 g/dL 1.9(L) 1.9(L) 2.1(L)  AST 15 - 41 U/L 168(H) 152(H) 185(H)  ALT 0 - 44 U/L 47(H) 41 47(H)  Alk Phosphatase 38 - 126 U/L 511(H) 458(H) 508(H)  Total Bilirubin 0.3 - 1.2 mg/dL 4.3(H) 4.0(H) 3.4(H)  Bilirubin, Direct 0.0 - 0.2 mg/dL 2.3(H) - -      Assessment:  #1.  Abnormal LFTs.  Patient remains with elevated bilirubin alkaline phosphatase and mildly elevated transaminases.  Bilirubin and alkaline phosphatase have been gradually climbing.  Patient has extensive metastatic disease to her liver and she also has biliary obstruction dating back to April 2020 when she was initially stented.  She had fully covered Wallstent 6 weeks ago.  Based on CT of about 2 weeks ago stent appears to be in good position but it may have migrated or occluded.  It is also possible that abnormal LFTs are secondary to widespread liver mets.  #2.  Nausea and vomiting.  She has not responded to low-dose metoclopramide.  She is not having any side effects.  Therefore will increase dose.  #  3.  Bilateral pleural effusions and ascites.  She has undergone left thoracenteses and abdominal paracenteses.  Dr. Roderic Palau feels effusion may be related to right upper lobe community-acquired pneumonia but I am concerned she may also have pleural and peritoneal mets.  She received albumin infusion this morning.  #4.  Metastatic breast carcinoma.  She has been on chemotherapy under supervision of Dr. [name].  She was responding to therapy and now the concern is that she is not.   Recommendations  Increase metoclopramide to 10 mg IV before each meal. Change diet to full liquids. MRCP in a.m. Repeat LFTs in a.m.

## 2019-04-25 NOTE — Progress Notes (Signed)
PROGRESS NOTE    Pamela Long  [id number] DOB: 04/21/1971 DOA: 04/19/2019 PCP: Doree Albee, MD    Brief Narrative:  48 year old female who presented with cough and dyspnea.  She does have significant past medical history for metastatic breast cancer, depression and asthma.  She reported worsening productive cough, associated with difficulty breathing for about a week.  Positive pleuritic chest pain.  On her initial physical examination her heart rate was 119, oxygen saturation 89% on room air, blood pressure 131/86, respiratory rate 19.  Her lungs were clear to auscultation bilaterally, heart S1-S2 present, tachycardic, abdomen soft, trace lower extremity edema more left than right SARS COVID-19 negative.  Chest radiograph with faint infiltrate right upper lobe, atelectasis left lower lobe with small pleural effusion.   Patient was admitted to the hospital with the working diagnosis of acute hypoxic respiratory failure due to community-acquired pneumonia, present on admission.  She has been responding well to antibiotic therapy with levofloxacin. Underwent left pleural effusion thoracentesis with 980 fluid obtained.   Assessment & Plan:   Principal Problem:   Pneumonia Active Problems:   Asthma   Depression   Metastatic breast cancer (HCC)   Respiratory distress   Malignant ascites   Malignant pleural effusion   1. Acute hypoxic respiratory failure due to right upper lobe community acquired pneumonia, complicated with left plural effusion. Patient complained of dyspnea and pleuritic chest pain, oximetry is 97% on 2 LPM per [place]. US guided thoracentesis to the left with 980 ml of yellow fluid.  Fluid analysis indicated transudate of process.  She is completed 5 days of intravenous levofloxacin for community-acquired pneumonia.  Will discontinue further antibiotics and she is now afebrile and overall respiratory status appears to be better.. Continue bronchodilator therapy.   Right-sided pleural effusion was evaluated and was not felt to be significant enough to attempt thoracentesis.  It was noted that she has significant ascites and she underwent paracentesis which had improved her shortness of breath. I suspect her ascites and pleural effusions are due to her hypoalbuminemia and low oncotic pressures. She has been started on albumin infusion  2. Metastatic breast cancer/ liver metastais/elevated LFTs. On chemotherapy as outpatient, will follow as outpatient.  LFTs were elevated on admission.  She recently underwent stent placement by Dr. Laural Golden in 03/2019.  GI consulted, appreciate input.  Started on Reglan for nausea and vomiting.  May need to have MRCP done on Monday if symptoms are persisting.  3. Hypokalemia and hypomagnesemia.  Replaced. Renal function stable with serum cr at 0.5.   4. HTN.  Blood pressure stable on metoprolol  5. Asthma. Stable with no signs of exacerbation.   6.  Ascites.  Likely related to underlying liver disease/hypoalbuminemia.  Patient has undergone paracentesis x2 during her hospital stay.   DVT prophylaxis: enoxaparin   Code Status:  full Family Communication: no family at the bedside  Disposition Plan/ discharge barriers:  Pending clinical improvement.   Body mass index is 27.12 kg/m. Malnutrition Type:  Nutrition Problem: Severe Malnutrition Etiology: cancer and cancer related treatments(breast cancer with mets to liver; last chemotherapy 10/15)   Malnutrition Characteristics:  Signs/Symptoms: percent weight loss, moderate fat depletion, severe fat depletion, moderate muscle depletion, severe muscle depletion, edema, energy intake < or equal to 75% for > or equal to 1 month, per patient/family report Percent weight loss: 12.6 %(23.5 lbs x 3 months)   Nutrition Interventions:  Interventions: Boost Breeze, Education  RN Pressure Injury Documentation:  Consultants:   Oncology  Interventional radiology    Gastroenterology  Procedures:   11/6 paracentesis with removal of 4.9 L of fluid  11/11 left thoracentesis with removal of 980 mL of fluid  11/13 paracentesis with removal of 4.7 L of fluid  Antimicrobials:   Levofloxacin 11/10 > 11/14   Subjective: Nausea and vomiting persist.  Unable to anything down by mouth.  Received suppository this morning had a small bowel movement.  Objective: Vitals:   04/25/19 0900 04/25/19 1457 04/25/19 1550 04/25/19 1555  BP:  103/66    Pulse: 92 (!) 106  (!) 103  Resp:  16    Temp:  98.5 F (36.9 C)    TempSrc:  Oral    SpO2:  (!) 87% (!) 88% 94%  Weight:      Height:        Intake/Output Summary (Last 24 hours) at 04/25/2019 1821 Last data filed at 04/25/2019 1748 Gross per 24 hour  Intake 894.19 ml  Output -  Net 894.19 ml   Filed Weights   04/19/19 1521  Weight: 73.9 kg    Examination:   General exam: Alert, awake, oriented x 3 Respiratory system: Clear to auscultation. Respiratory effort normal. Cardiovascular system:RRR. No murmurs, rubs, gallops. Gastrointestinal system: Abdomen is mildly distended distended, soft and nontender. No organomegaly or masses felt. Normal bowel sounds heard. Central nervous system: Alert and oriented. No focal neurological deficits. Extremities: 1+ edema bilaterally Skin: No rashes, lesions or ulcers Psychiatry: Judgement and insight appear normal. Mood & affect appropriate.    Data Reviewed: I have personally reviewed following labs and imaging studies  CBC: Recent Labs  Lab 04/19/19 1533 04/20/19 0444 04/21/19 0612 04/22/19 0503 04/23/19 0420  WBC 10.1 9.3 9.8 9.5 10.1  NEUTROABS 8.2*  --   --  7.6  --   HGB 11.6* 10.7* 10.8* 11.1* 11.6*  HCT 35.4* 33.5* 34.1* 34.5* 36.1  MCV 80.3 80.9 81.6 81.6 80.2  PLT 183 187 190 212 [id number]   Basic Metabolic Panel: Recent Labs  Lab 04/19/19 1533 04/20/19 0444 04/21/19 0612 04/22/19 0503 04/23/19 0420 04/24/19 0650  NA 131* 135 135 135  134* 134*  K 2.9* 3.1* 4.0 4.0 3.8 3.7  CL 96* 97* 100 100 99 98  CO2 25 27 27 25 25 26   GLUCOSE 112* 85 98 101* 105* 78  BUN 7 7 9 10 9 10   CREATININE 0.52 0.53 0.52 0.57 0.59 0.47  CALCIUM 8.3* 8.1* 8.3* 8.5* 8.5* 8.5*  MG 1.5* 2.1 1.8 1.9  --   --    GFR: Estimated Creatinine Clearance: 84.7 mL/min (by C-G formula based on SCr of 0.47 mg/dL). Liver Function Tests: Recent Labs  Lab 04/20/19 0444 04/21/19 0612 04/23/19 0420 04/24/19 0650 04/25/19 0631  AST 144* 126* 185* 152* 168*  ALT 52* 44 47* 41 47*  ALKPHOS 393* 383* 508* 458* 511*  BILITOT 3.7* 3.1* 3.4* 4.0* 4.3*  PROT 5.6* 5.0* 6.0* 5.4* 5.4*  ALBUMIN 2.1* 2.0* 2.1* 1.9* 1.9*   Recent Labs  Lab 04/19/19 1533  LIPASE 15   No results for input(s): AMMONIA in the last 168 hours. Coagulation Profile: No results for input(s): INR, PROTIME in the last 168 hours. Cardiac Enzymes: No results for input(s): CKTOTAL, CKMB, CKMBINDEX, TROPONINI in the last 168 hours. BNP (last 3 results) No results for input(s): PROBNP in the last 8760 hours. HbA1C: No results for input(s): HGBA1C in the last 72 hours. CBG: No results for input(s): GLUCAP in  the last 168 hours. Lipid Profile: No results for input(s): CHOL, HDL, LDLCALC, TRIG, CHOLHDL, LDLDIRECT in the last 72 hours. Thyroid Function Tests: No results for input(s): TSH, T4TOTAL, FREET4, T3FREE, THYROIDAB in the last 72 hours. Anemia Panel: No results for input(s): VITAMINB12, FOLATE, FERRITIN, TIBC, IRON, RETICCTPCT in the last 72 hours.    Radiology Studies: I have reviewed all of the imaging during this hospital visit personally     Scheduled Meds: . Chlorhexidine Gluconate Cloth  6 each Topical Daily  . enoxaparin (LOVENOX) injection  40 mg Subcutaneous Q24H  . feeding supplement  1 Container Oral TID BM  . gabapentin  300 mg Oral QHS  . lubiprostone  8 mcg Oral BID WC  . metoCLOPramide (REGLAN) injection  10 mg Intravenous TID AC  . metoprolol tartrate   12.5 mg Oral BID  . pantoprazole (PROTONIX) IV  40 mg Intravenous Q24H   Continuous Infusions: . albumin human 25 g (04/25/19 1406)     LOS: 6 days        Kathie Dike, MD

## 2019-04-26 ENCOUNTER — Inpatient Hospital Stay (HOSPITAL_COMMUNITY): Payer: 59

## 2019-04-26 DIAGNOSIS — J90 Pleural effusion, not elsewhere classified: Secondary | ICD-10-CM

## 2019-04-26 LAB — URINALYSIS, ROUTINE W REFLEX MICROSCOPIC
Glucose, UA: NEGATIVE mg/dL
Hgb urine dipstick: NEGATIVE
Ketones, ur: NEGATIVE mg/dL
Nitrite: NEGATIVE
Protein, ur: 30 mg/dL — AB
Specific Gravity, Urine: 1.028 (ref 1.005–1.030)
pH: 5 (ref 5.0–8.0)

## 2019-04-26 LAB — CULTURE, BODY FLUID W GRAM STAIN -BOTTLE: Culture: NO GROWTH

## 2019-04-26 LAB — HEPATIC FUNCTION PANEL
ALT: 35 U/L (ref 0–44)
AST: 124 U/L — ABNORMAL HIGH (ref 15–41)
Albumin: 3.3 g/dL — ABNORMAL LOW (ref 3.5–5.0)
Alkaline Phosphatase: 365 U/L — ABNORMAL HIGH (ref 38–126)
Bilirubin, Direct: 2.9 mg/dL — ABNORMAL HIGH (ref 0.0–0.2)
Indirect Bilirubin: 2.6 mg/dL — ABNORMAL HIGH (ref 0.3–0.9)
Total Bilirubin: 5.5 mg/dL — ABNORMAL HIGH (ref 0.3–1.2)
Total Protein: 5.7 g/dL — ABNORMAL LOW (ref 6.5–8.1)

## 2019-04-26 IMAGING — DX DG CHEST 2V
2 series · 2 of 2 positions shown · non-contrast
Comparison: [DATE]

CLINICAL DATA: Short of breath.  History of breast cancer.

EXAM:
CHEST - 2 VIEW

[chest pa]
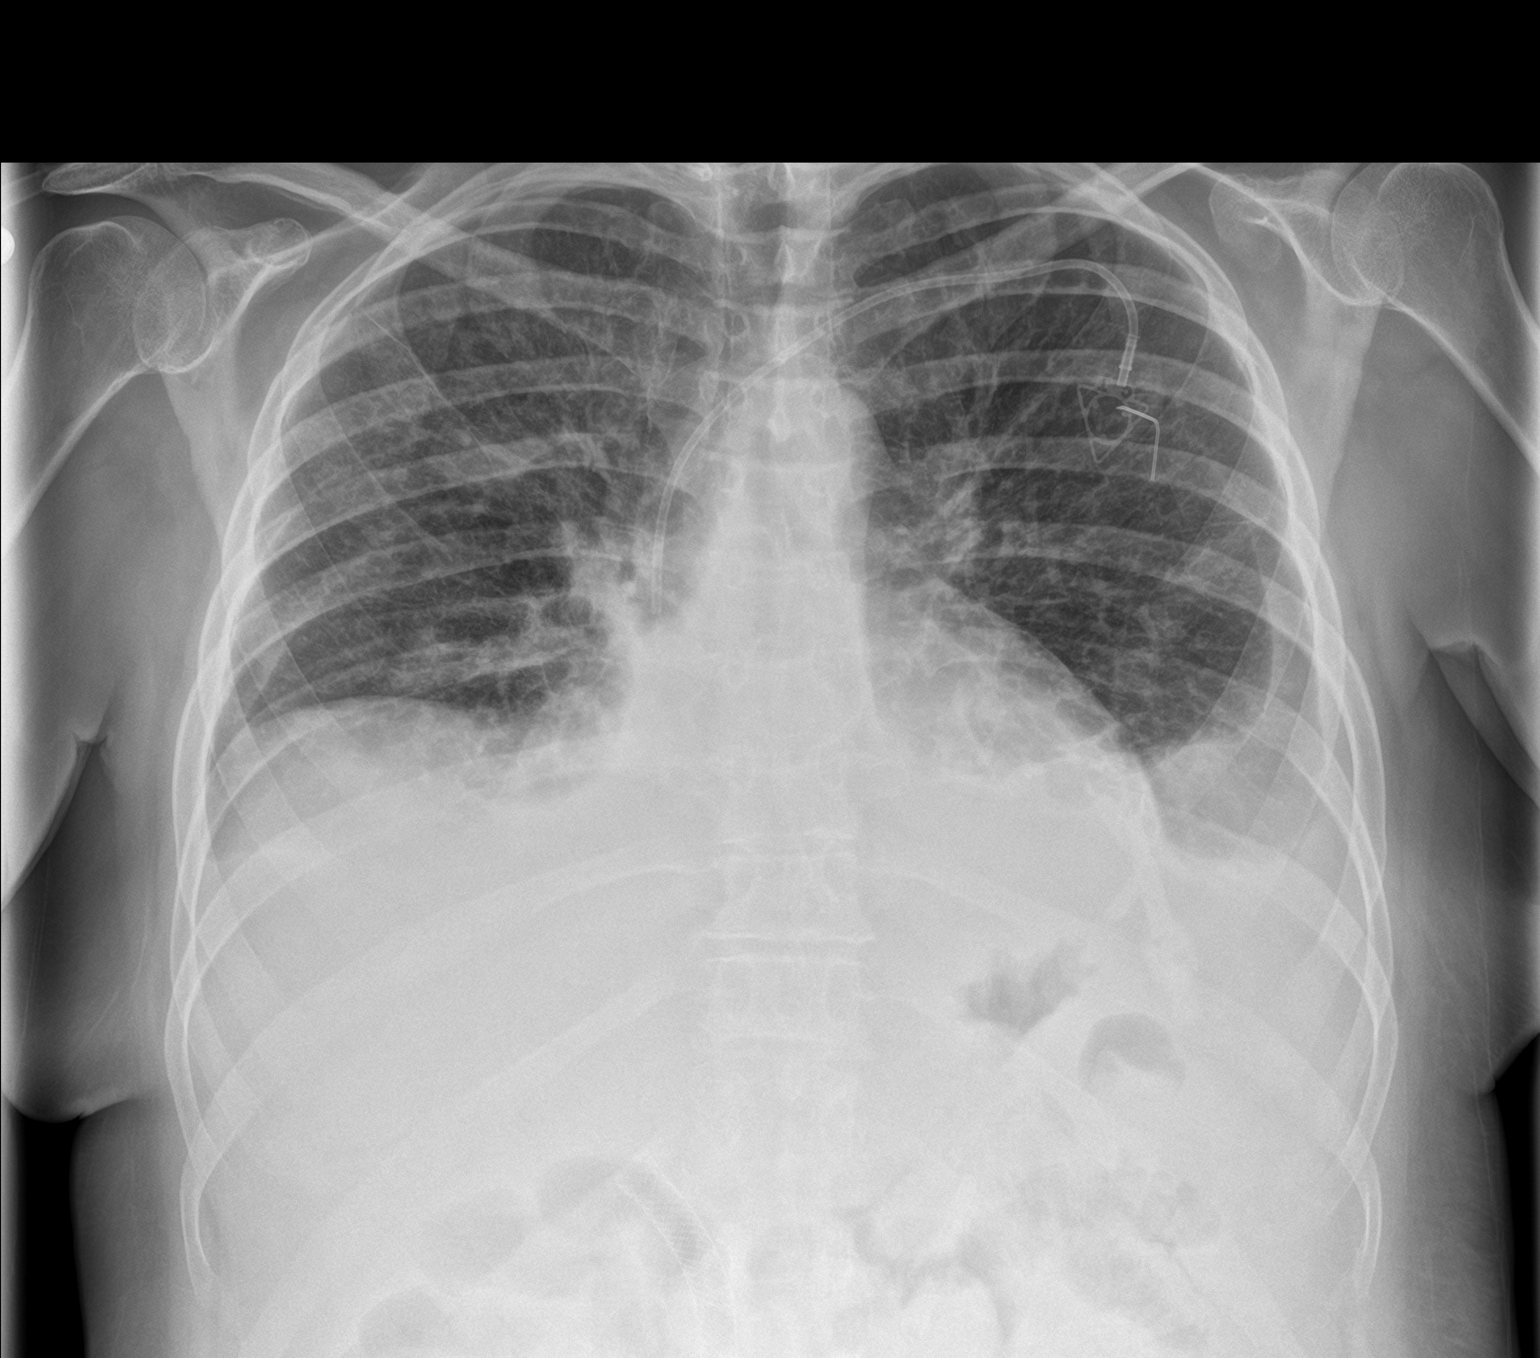

[chest lat]
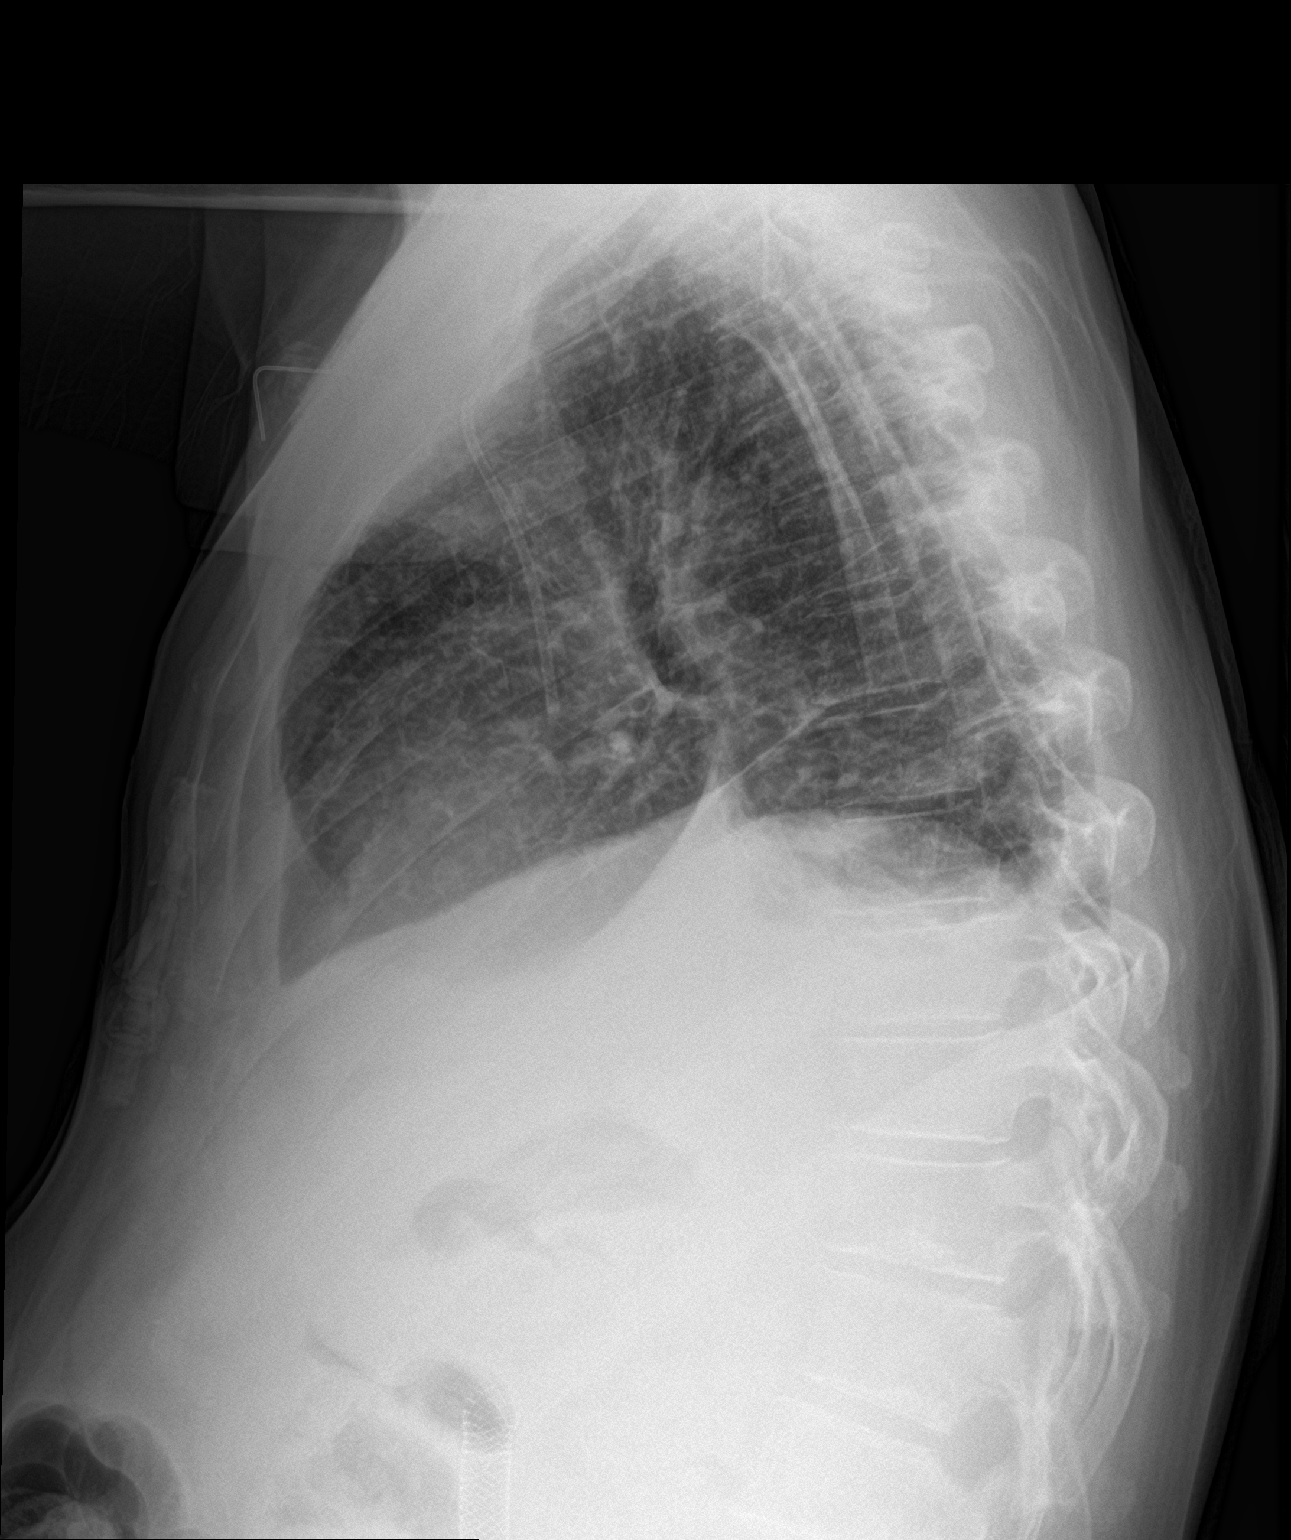

[2 of 2 positions shown; findings below may reference images not displayed]

FINDINGS: Small left pleural effusion and left lower lobe airspace disease
unchanged. Right lower lobe mild airspace disease also unchanged.
Mild right upper lobe airspace disease unchanged.

Port-A-Cath tip SVC unchanged.
IMPRESSION: Bilateral airspace disease unchanged. Small left effusion unchanged.

## 2019-04-26 IMAGING — MR MR 3D RECON AT SCANNER
11 series · 15 of 16 positions shown · non-contrast
Comparison: [DATE] MRI abdomen.  [DATE] CT abdomen/pelvis.

CLINICAL DATA: Inpatient. Metastatic breast cancer to the liver
with history of biliary obstruction requiring CBD stent. Patient now
with worsening diffuse chest pain and abdominal distention for 2
weeks with ongoing chemotherapy.

EXAM:
MRI ABDOMEN WITHOUT CONTRAST  (INCLUDING MRCP)
TECHNIQUE: Multiplanar multisequence MR imaging of the abdomen was performed.
Heavily T2-weighted images of the biliary and pancreatic ducts were
obtained, and three-dimensional MRCP images were rendered by post
processing.

[Series 3: GRE · coronal · 5.0mm · 1.12mm/px · 1 of 36 slices shown]
[im 1/36]
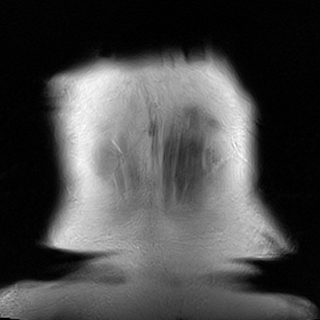

[Series 4: T2 · axial · 5.0mm · 1.19mm/px · 1 of 50 slices shown (1 of 2)]
[im 1/50]
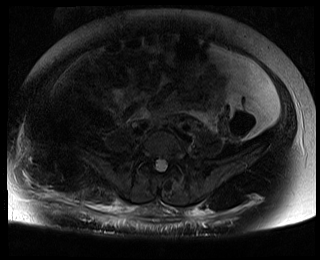

[Series 5: ax dual echo_in · axial · 4.0mm · 0.59mm/px · 1 of 64 slices shown]
[im 1/64]
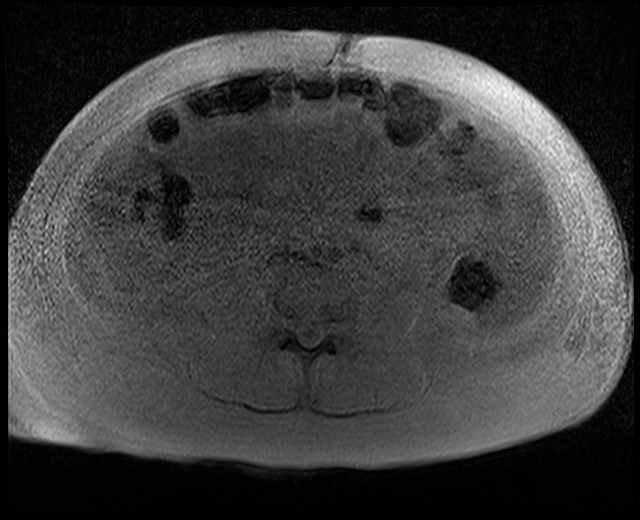

[Series 6: ax dual echo_opp · axial · 4.0mm · 0.59mm/px · 1 of 64 slices shown]
[im 1/64]
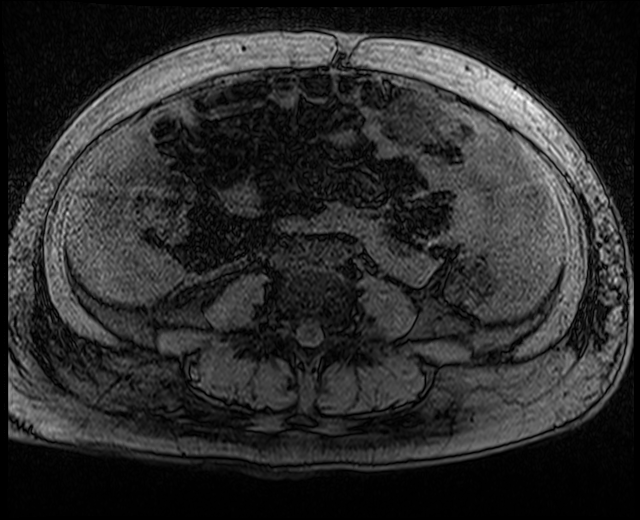

[Series 9: T1 fat-sat · axial · 3.7mm · 0.62mm/px · 1 of 80 slices shown (1 of 2)]
[im 1/80]
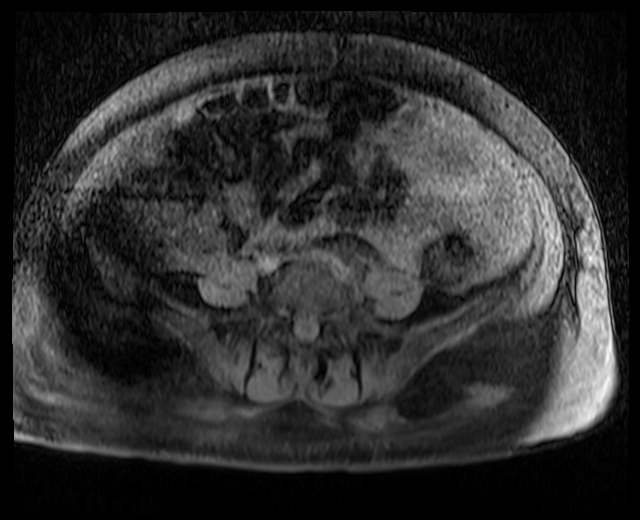

[Series 10: T1 fat-sat · coronal · 2.5mm · 1.19mm/px · 2 of 96 slices shown (2 of 2)]
[im 1/96]
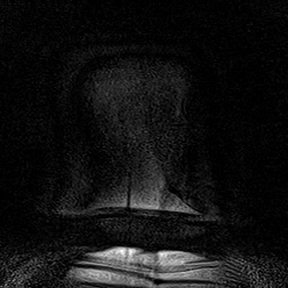
[im 96/96]
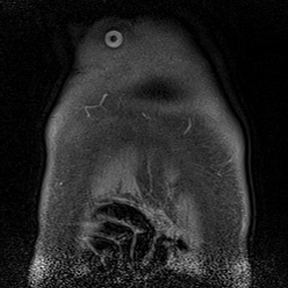

[Series 11: MRCP · coronal · 1.0mm · 0.42mm/px · 3 of 192 slices shown]
[im 64/192]
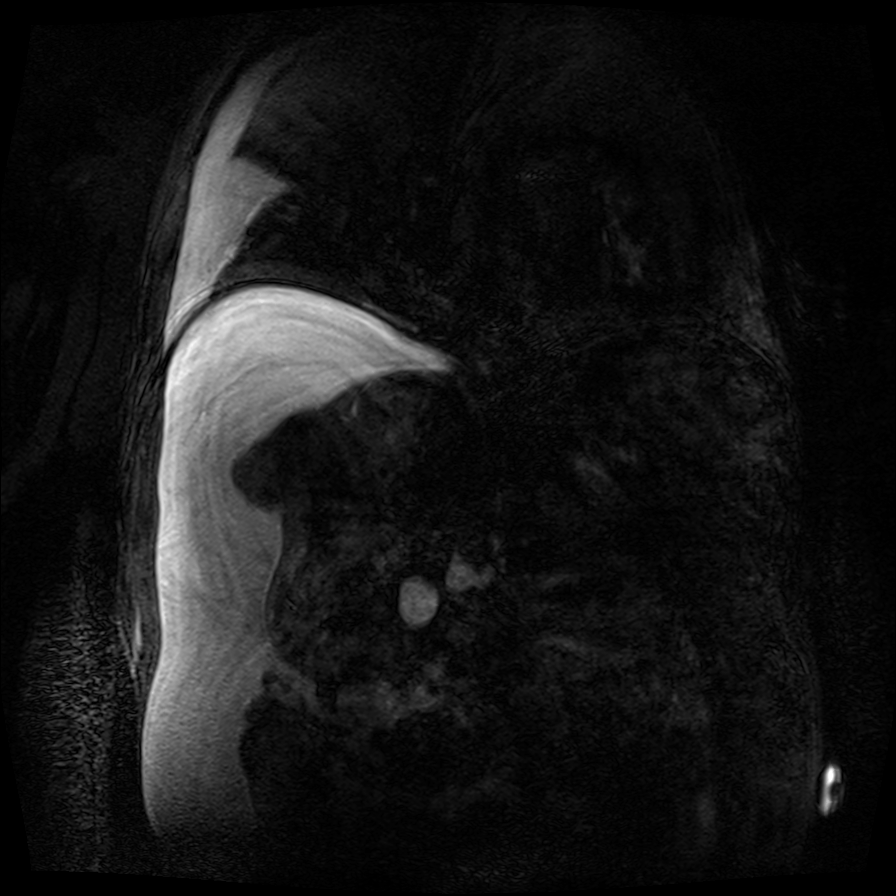
[im 128/192]
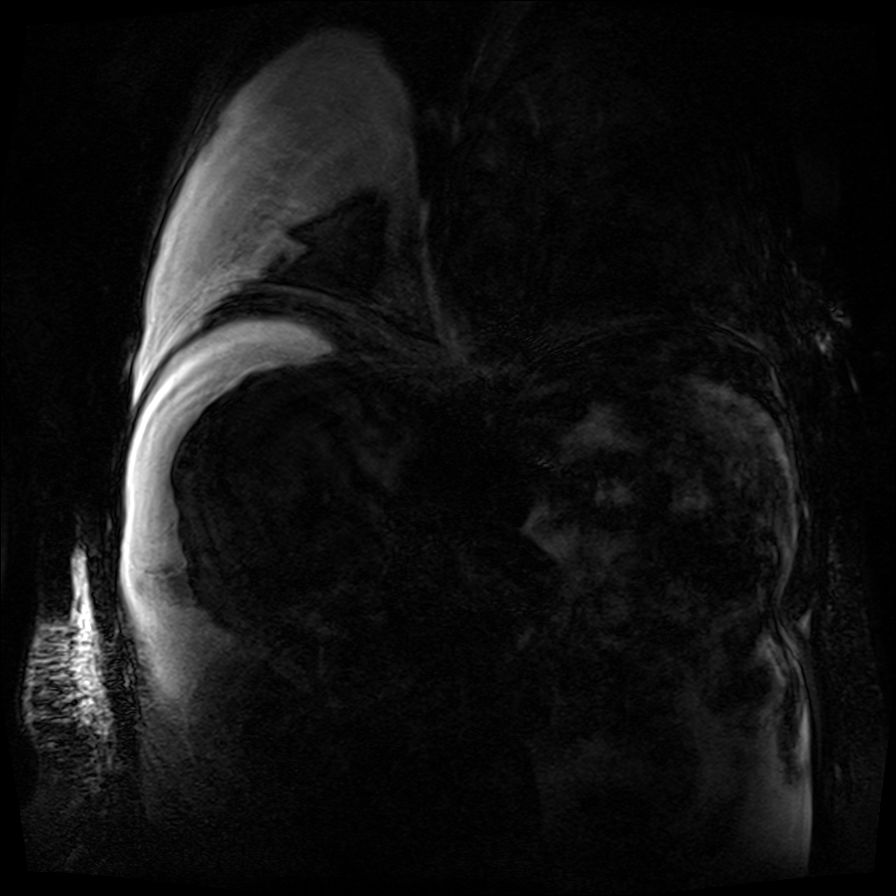
[im 192/192]
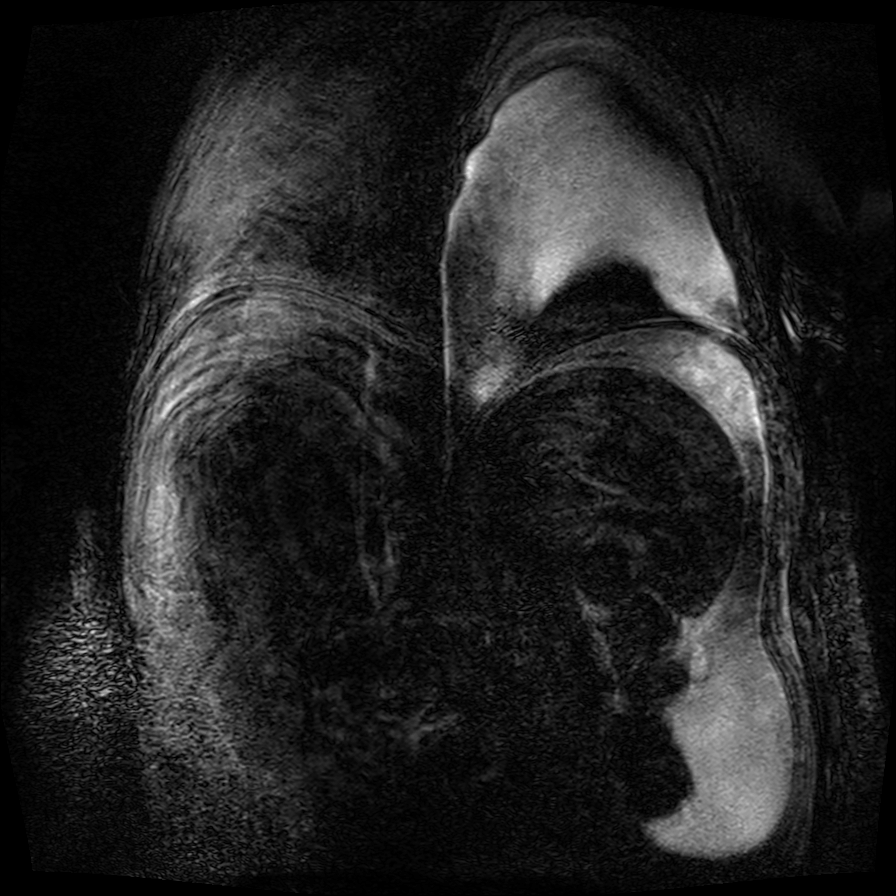

[Series 13: DWI · axial · 5.0mm · 0.99mm/px · z∈[-171,+63]mm · 2 of 80 slices shown]
[im 1/80]
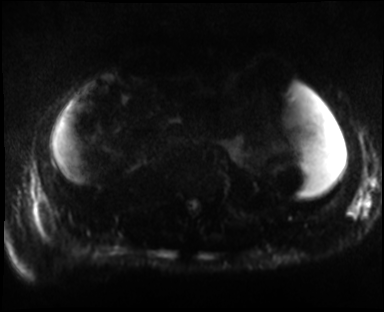
[im 80/80]
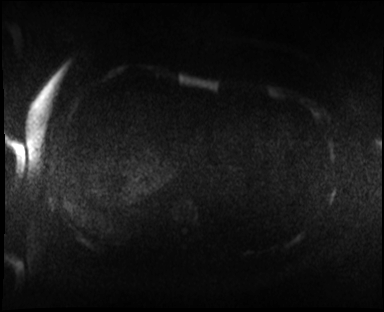

[Series 14: ax dwi_adc · axial · 5.0mm · 0.99mm/px · 1 of 40 slices shown]
[im 1/40]
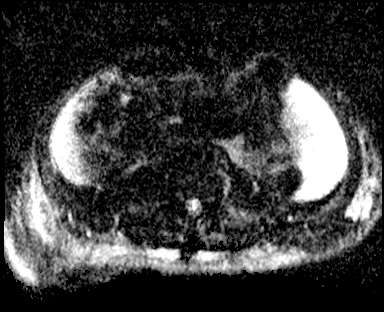

[Series 15: T2 · axial · 5.0mm · 1.48mm/px · 1 of 40 slices shown (2 of 2)]
[im 1/40]
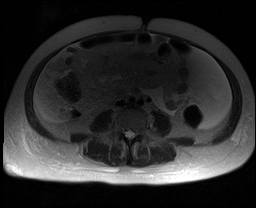

[Series 100: <mip range> · coronal · 0.33mm/px · 1 of 14 slices shown]
[im 1/14]
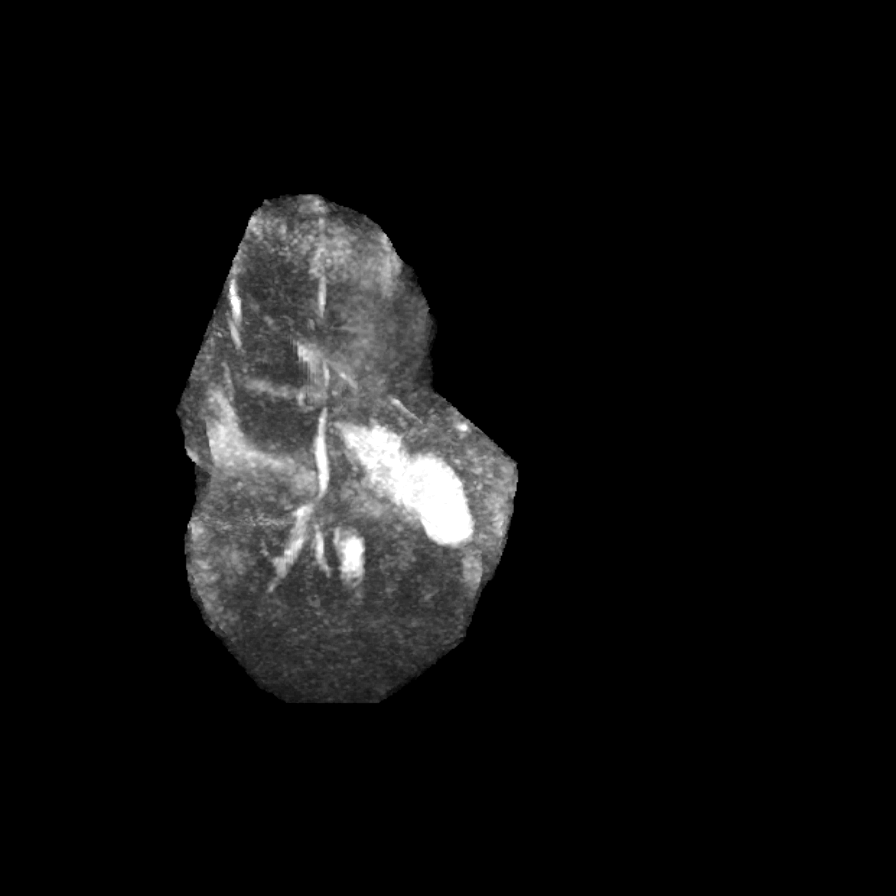

[15 of 16 positions shown; findings below may reference images not displayed]

FINDINGS: Lower chest: Small to moderate dependent bilateral pleural
effusions, increased on the right and similar on the left since
[DATE] CT study.

Hepatobiliary: Liver is enlarged by numerous (at least 10) similar
liver masses scattered throughout the liver, all with signal
intensity similar to the spleen (mildly T2 hyperintense and T1
hypointense), compatible with liver metastases. Representative liver
masses as follows:

-segment 4b/5 inferior liver 9.5 x 6.8 cm mass (series 9/image 43),
previously 9.6 x 6.9 cm on [DATE] CT using similar measurement
technique, not substantially changed

-segment 4A left liver lobe 8.1 x 6.6 cm mass (series 9/image 30),
previously 7.2 x 6.1 cm on [DATE] CT using similar measurement
technique, mildly increased

-segment 8 right liver dome 1.9 x 1.9 cm mass (series 9/ image 21),
previously 1.5 x 1.2 cm, mildly increased

-far inferior right liver 2.8 x 2.4 cm mass (series 9/image 50),
previously 2.5 x 2.1 cm, mildly increased

No hepatic steatosis. Decompressed gallbladder. No definite
gallbladder wall thickening. Small amount layering gallbladder
sludge with no convincing gallstones. Well-positioned CBD stent in
stable position with the distal tip in the duodenal lumen just
beyond the ampulla. Mild intrahepatic biliary ductal dilatation
throughout segments 2, 3 and 4A of the left liver lobe and minimal
intrahepatic biliary ductal dilatation in the inferior central right
liver lobe, not substantially changed since [DATE] CT. Common
bile duct diameter 10 mm. Gas and small amount of layering fluid
noted in CBD stent.

Pancreas: No pancreatic mass or duct dilation.  No pancreas divisum.

Spleen: Normal size. No mass.

Adrenals/Urinary Tract: Normal adrenals. No hydronephrosis. Normal
kidneys with no renal mass.

Stomach/Bowel: Normal non-distended stomach. Visualized small and
large bowel is normal caliber, with no bowel wall thickening.

Vascular/Lymphatic: Normal caliber abdominal aorta. No
pathologically enlarged lymph nodes in the abdomen.

Other: Moderate to large volume abdominal ascites, mildly increased.

Musculoskeletal: No aggressive appearing focal osseous lesions.
IMPRESSION: 1. Mild progression of hepatic metastatic disease since [DATE]
CT as detailed.
2. Stable well-positioned CBD stent. Mild intrahepatic biliary
ductal dilatation, most prominent in the left liver lobe, not
substantially changed since [DATE] CT.
3. Moderate to large volume abdominal ascites, mildly increased.
4. Small to moderate dependent bilateral pleural effusions,
increased on the right and stable on the left.

## 2019-04-26 MED ORDER — PANTOPRAZOLE SODIUM 40 MG IV SOLR
40.0000 mg | Freq: Two times a day (BID) | INTRAVENOUS | Status: DC
  Administered 2019-04-27 – 2019-05-01 (×9): 40 mg via INTRAVENOUS
  Filled 2019-04-26 (×9): qty 40

## 2019-04-26 NOTE — Progress Notes (Signed)
Subjective:  Patient does not feel any better.  She is having more heartburn today.  She is still having nausea and vomiting.  Abdominal pain comes and goes.  She still has cough when she lies flat.  She feels better when she is sitting upright in bed.  Objective: Blood pressure 99/68, pulse 94, temperature 98.6 F (37 C), resp. rate 18, height '5\' 5"'$  (1.651 m), weight 73.9 kg, SpO2 94 %. Patient is alert and appears to be chronically ill. Cardiac exam with regular rhythm normal S1 and S2.  No murmur gallop noted Auscultation of lungs reveal vesicular breath sounds bilaterally. Abdomen is full soft with mild tenderness across upper abdomen more so in epigastric region. 1-2+ pitting edema involving both legs.  Labs/studies Results:  CBC Latest Ref Rng & Units 04/23/2019 04/22/2019 04/21/2019  WBC 4.0 - 10.5 K/uL 10.1 9.5 9.8  Hemoglobin 12.0 - 15.0 g/dL 11.6(L) 11.1(L) 10.8(L)  Hematocrit 36.0 - 46.0 % 36.1 34.5(L) 34.1(L)  Platelets 150 - 400 K/uL 217 212 190    CMP Latest Ref Rng & Units 04/26/2019 04/25/2019 04/24/2019  Glucose 70 - 99 mg/dL - - 78  BUN 6 - 20 mg/dL - - 10  Creatinine 0.44 - 1.00 mg/dL - - 0.47  Sodium 135 - 145 mmol/L - - 134(L)  Potassium 3.5 - 5.1 mmol/L - - 3.7  Chloride 98 - 111 mmol/L - - 98  CO2 22 - 32 mmol/L - - 26  Calcium 8.9 - 10.3 mg/dL - - 8.5(L)  Total Protein 6.5 - 8.1 g/dL 5.7(L) 5.4(L) 5.4(L)  Total Bilirubin 0.3 - 1.2 mg/dL 5.5(H) 4.3(H) 4.0(H)  Alkaline Phos 38 - 126 U/L 365(H) 511(H) 458(H)  AST 15 - 41 U/L 124(H) 168(H) 152(H)  ALT 0 - 44 U/L 35 47(H) 41    Hepatic Function Latest Ref Rng & Units 04/26/2019 04/25/2019 04/24/2019  Total Protein 6.5 - 8.1 g/dL 5.7(L) 5.4(L) 5.4(L)  Albumin 3.5 - 5.0 g/dL 3.3(L) 1.9(L) 1.9(L)  AST 15 - 41 U/L 124(H) 168(H) 152(H)  ALT 0 - 44 U/L 35 47(H) 41  Alk Phosphatase 38 - 126 U/L 365(H) 511(H) 458(H)  Total Bilirubin 0.3 - 1.2 mg/dL 5.5(H) 4.3(H) 4.0(H)  Bilirubin, Direct 0.0 - 0.2 mg/dL 2.9(H)  2.3(H) -    MRCP images reviewed with Dr. Polly Cobia and Dr. Thornton Papas Findings as below.  1. Mild progression of hepatic metastatic disease since 04/10/2019 CT as detailed. 2. Stable well-positioned CBD stent. Mild intrahepatic biliary ductal dilatation, most prominent in the left liver lobe, not substantially changed since 04/10/2019 CT. 3. Moderate to large volume abdominal ascites, mildly increased. 4. Small to moderate dependent bilateral pleural effusions, increased on the right and stable on the left.   Assessment:  #1.  Abnormal LFTs.  Bilirubin keeps going up.  It is about 60% direct and rest indirect.  Transaminases and alkaline phosphatase are down today.  It appears that the tumor is extending to proximal and of Wallstent.  There is no intrahepatic biliary dilation except mild dilation in the left lobe.  I do not believe she has complete occlusion of biliary stent but she may have some degree of obstruction due to liver mets.  Liver mets also account for elevated alkaline phosphatase transaminases and bilirubin.  Since she has widespread hepatic metastatic disease changing her stent or putting longer stents will make any difference.  I would like for patient to think about it before final decision made.  #2.  Site he is has increased  significantly on the study.  She would benefit from abdominal tap.  #3.  Metastatic breast carcinoma.  Chemotherapy on hold for now.  #4.  Nausea and vomiting secondary to metastatic disease and she may also have an element of gastroparesis.  #5. GERD.  Heartburn not well controlled with single PPI dose.   Recommendations  We will repeat LFTs in a.m. and then determine if biliary stent should be changed. Increase pantoprazole to 40 mg IV every 12.

## 2019-04-26 NOTE — Consult Note (Signed)
[facility] Oncology Progress Note  Name: Pamela Long      MRN: [id number]    Location: A305/A305-01  Date: 04/26/2019 Time:7:08 PM   Subjective: Interval History:Pamela Long is seen today for follow-up.  She is lying in bed and feeling drowsy as she received antinausea medication.  She underwent abdominal paracentesis and left-sided thoracentesis with 90 mL of yellow fluid removed.  She reported slight improvement in her shortness of breath.  However she became nauseous when she went for a walk down the hall.  She does continue to have intermittent nausea episodes.  As result she is not able to eat much.  Objective: Vital signs in last 24 hours: Temp:  [97.8 F (36.6 C)-98.6 F (37 C)] 98.6 F (37 C) (11/16 1554) Pulse Rate:  [89-94] 94 (11/16 1554) Resp:  [16-20] 18 (11/16 1554) BP: (91-99)/(56-68) 99/68 (11/16 1554) SpO2:  [94 %-96 %] 94 % (11/16 1554)    Intake/Output from previous day: 11/15 0800 - 11/16 0759 In: 1089 [P.O.:720] Out: -     Intake/Output this shift: Total I/O In: 480 [P.O.:480] Out: -    PHYSICAL EXAM: BP 99/68 (BP Location: Right Arm)   Pulse 94   Temp 98.6 F (37 C)   Resp 18   Ht '5\' 5"'  (1.651 m)   Wt 163 lb (73.9 kg)   SpO2 94%   BMI 27.12 kg/m  Lungs: clear to auscultation bilaterally Heart: regular rate and rhythm Abdomen: Soft, distended with no pain. Extremities: extremities normal, atraumatic, no cyanosis or edema Skin: Skin color, texture, turgor normal. No rashes or lesions Lymph nodes: Cervical, supraclavicular, and axillary nodes normal. Neurologic: Grossly normal   Studies/Results: Results for orders placed or performed during the hospital encounter of 04/19/19 (from the past 48 hour(s))  Hepatic function panel     Status: Abnormal   Collection Time: 04/25/19  6:31 AM  Result Value Ref Range   Total Protein 5.4 (L) 6.5 - 8.1 g/dL   Albumin 1.9 (L) 3.5 - 5.0 g/dL   AST 168 (H) 15 - 41 U/L   ALT 47 (H) 0 - 44  U/L   Alkaline Phosphatase 511 (H) 38 - 126 U/L   Total Bilirubin 4.3 (H) 0.3 - 1.2 mg/dL   Bilirubin, Direct 2.3 (H) 0.0 - 0.2 mg/dL   Indirect Bilirubin 2.0 (H) 0.3 - 0.9 mg/dL    Comment: Performed at [facility], [address] [place], [place] [address]  Hepatic function panel     Status: Abnormal   Collection Time: 04/26/19  4:47 AM  Result Value Ref Range   Total Protein 5.7 (L) 6.5 - 8.1 g/dL   Albumin 3.3 (L) 3.5 - 5.0 g/dL   AST 124 (H) 15 - 41 U/L   ALT 35 0 - 44 U/L   Alkaline Phosphatase 365 (H) 38 - 126 U/L   Total Bilirubin 5.5 (H) 0.3 - 1.2 mg/dL   Bilirubin, Direct 2.9 (H) 0.0 - 0.2 mg/dL   Indirect Bilirubin 2.6 (H) 0.3 - 0.9 mg/dL    Comment: Performed at [facility], [address], [place], [place] [address]  Urinalysis, Routine w reflex microscopic     Status: Abnormal   Collection Time: 04/26/19 10:14 AM  Result Value Ref Range   Color, Urine AMBER (A) YELLOW    Comment: BIOCHEMICALS MAY BE AFFECTED BY COLOR   APPearance HAZY (A) CLEAR   Specific Gravity, Urine 1.028 1.005 - 1.030   pH 5.0 5.0 -  8.0   Glucose, UA NEGATIVE NEGATIVE mg/dL   Hgb urine dipstick NEGATIVE NEGATIVE   Bilirubin Urine MODERATE (A) NEGATIVE   Ketones, ur NEGATIVE NEGATIVE mg/dL   Protein, ur 30 (A) NEGATIVE mg/dL   Nitrite NEGATIVE NEGATIVE   Leukocytes,Ua TRACE (A) NEGATIVE   RBC / HPF 0-5 0 - 5 RBC/hpf   WBC, UA 6-10 0 - 5 WBC/hpf   Bacteria, UA RARE (A) NONE SEEN   Squamous Epithelial / LPF 0-5 0 - 5   Mucus PRESENT     Comment: Performed at [facility], [address], [place],  [address]   Dg Chest 2 View  Result Date: 04/26/2019 CLINICAL DATA:  Short of breath.  History of breast cancer. EXAM: CHEST - 2 VIEW COMPARISON:  04/22/2019 FINDINGS: Small left pleural effusion and left lower lobe airspace disease unchanged. Right lower lobe mild airspace disease also unchanged. Mild right upper lobe airspace disease unchanged. Port-A-Cath tip SVC unchanged. IMPRESSION:  Bilateral airspace disease unchanged. Small left effusion unchanged. Electronically Signed   By: Franchot Gallo M.D.   On: 04/26/2019 09:04   Mr Abdomen Mrcp Wo Contrast  Result Date: 04/26/2019 CLINICAL DATA:  Inpatient. Metastatic breast cancer to the liver with history of biliary obstruction requiring CBD stent. Patient now with worsening diffuse chest pain and abdominal distention for 2 weeks with ongoing chemotherapy. EXAM: MRI ABDOMEN WITHOUT CONTRAST  (INCLUDING MRCP) TECHNIQUE: Multiplanar multisequence MR imaging of the abdomen was performed. Heavily T2-weighted images of the biliary and pancreatic ducts were obtained, and three-dimensional MRCP images were rendered by post processing. COMPARISON:  09/18/2018 MRI abdomen.  04/10/2019 CT abdomen/pelvis. FINDINGS: Lower chest: Small to moderate dependent bilateral pleural effusions, increased on the right and similar on the left since 04/10/2019 CT study. Hepatobiliary: Liver is enlarged by numerous (at least 10) similar liver masses scattered throughout the liver, all with signal intensity similar to the spleen (mildly T2 hyperintense and T1 hypointense), compatible with liver metastases. Representative liver masses as follows: -segment 4b/5 inferior liver 9.5 x 6.8 cm mass (series 9/image 43), previously 9.6 x 6.9 cm on 04/10/2019 CT using similar measurement technique, not substantially changed -segment 4A left liver lobe 8.1 x 6.6 cm mass (series 9/image 30), previously 7.2 x 6.1 cm on 04/10/2019 CT using similar measurement technique, mildly increased -segment 8 right liver dome 1.9 x 1.9 cm mass (series 9/ image 21), previously 1.5 x 1.2 cm, mildly increased -far inferior right liver 2.8 x 2.4 cm mass (series 9/image 50), previously 2.5 x 2.1 cm, mildly increased No hepatic steatosis. Decompressed gallbladder. No definite gallbladder wall thickening. Small amount layering gallbladder sludge with no convincing gallstones. Well-positioned CBD stent  in stable position with the distal tip in the duodenal lumen just beyond the ampulla. Mild intrahepatic biliary ductal dilatation throughout segments 2, 3 and 4A of the left liver lobe and minimal intrahepatic biliary ductal dilatation in the inferior central right liver lobe, not substantially changed since 04/10/2019 CT. Common bile duct diameter 10 mm. Gas and small amount of layering fluid noted in CBD stent. Pancreas: No pancreatic mass or duct dilation.  No pancreas divisum. Spleen: Normal size. No mass. Adrenals/Urinary Tract: Normal adrenals. No hydronephrosis. Normal kidneys with no renal mass. Stomach/Bowel: Normal non-distended stomach. Visualized small and large bowel is normal caliber, with no bowel wall thickening. Vascular/Lymphatic: Normal caliber abdominal aorta. No pathologically enlarged lymph nodes in the abdomen. Other: Moderate to large volume abdominal ascites, mildly increased. Musculoskeletal: No aggressive appearing focal osseous  lesions. IMPRESSION: 1. Mild progression of hepatic metastatic disease since 04/10/2019 CT as detailed. 2. Stable well-positioned CBD stent. Mild intrahepatic biliary ductal dilatation, most prominent in the left liver lobe, not substantially changed since 04/10/2019 CT. 3. Moderate to large volume abdominal ascites, mildly increased. 4. Small to moderate dependent bilateral pleural effusions, increased on the right and stable on the left. Electronically Signed   By: Ilona Sorrel M.D.   On: 04/26/2019 10:00   Mr 3d Recon At Scanner  Result Date: 04/26/2019 CLINICAL DATA:  Inpatient. Metastatic breast cancer to the liver with history of biliary obstruction requiring CBD stent. Patient now with worsening diffuse chest pain and abdominal distention for 2 weeks with ongoing chemotherapy. EXAM: MRI ABDOMEN WITHOUT CONTRAST  (INCLUDING MRCP) TECHNIQUE: Multiplanar multisequence MR imaging of the abdomen was performed. Heavily T2-weighted images of the biliary and  pancreatic ducts were obtained, and three-dimensional MRCP images were rendered by post processing. COMPARISON:  09/18/2018 MRI abdomen.  04/10/2019 CT abdomen/pelvis. FINDINGS: Lower chest: Small to moderate dependent bilateral pleural effusions, increased on the right and similar on the left since 04/10/2019 CT study. Hepatobiliary: Liver is enlarged by numerous (at least 10) similar liver masses scattered throughout the liver, all with signal intensity similar to the spleen (mildly T2 hyperintense and T1 hypointense), compatible with liver metastases. Representative liver masses as follows: -segment 4b/5 inferior liver 9.5 x 6.8 cm mass (series 9/image 43), previously 9.6 x 6.9 cm on 04/10/2019 CT using similar measurement technique, not substantially changed -segment 4A left liver lobe 8.1 x 6.6 cm mass (series 9/image 30), previously 7.2 x 6.1 cm on 04/10/2019 CT using similar measurement technique, mildly increased -segment 8 right liver dome 1.9 x 1.9 cm mass (series 9/ image 21), previously 1.5 x 1.2 cm, mildly increased -far inferior right liver 2.8 x 2.4 cm mass (series 9/image 50), previously 2.5 x 2.1 cm, mildly increased No hepatic steatosis. Decompressed gallbladder. No definite gallbladder wall thickening. Small amount layering gallbladder sludge with no convincing gallstones. Well-positioned CBD stent in stable position with the distal tip in the duodenal lumen just beyond the ampulla. Mild intrahepatic biliary ductal dilatation throughout segments 2, 3 and 4A of the left liver lobe and minimal intrahepatic biliary ductal dilatation in the inferior central right liver lobe, not substantially changed since 04/10/2019 CT. Common bile duct diameter 10 mm. Gas and small amount of layering fluid noted in CBD stent. Pancreas: No pancreatic mass or duct dilation.  No pancreas divisum. Spleen: Normal size. No mass. Adrenals/Urinary Tract: Normal adrenals. No hydronephrosis. Normal kidneys with no renal mass.  Stomach/Bowel: Normal non-distended stomach. Visualized small and large bowel is normal caliber, with no bowel wall thickening. Vascular/Lymphatic: Normal caliber abdominal aorta. No pathologically enlarged lymph nodes in the abdomen. Other: Moderate to large volume abdominal ascites, mildly increased. Musculoskeletal: No aggressive appearing focal osseous lesions. IMPRESSION: 1. Mild progression of hepatic metastatic disease since 04/10/2019 CT as detailed. 2. Stable well-positioned CBD stent. Mild intrahepatic biliary ductal dilatation, most prominent in the left liver lobe, not substantially changed since 04/10/2019 CT. 3. Moderate to large volume abdominal ascites, mildly increased. 4. Small to moderate dependent bilateral pleural effusions, increased on the right and stable on the left. Electronically Signed   By: Ilona Sorrel M.D.   On: 04/26/2019 10:00     MEDICATIONS: I have reviewed the patient's current medications.     Assessment/Plan:  1.  Metastatic breast cancer to the liver, ER/PR positive and HER-2 negative: -Progression on paclitaxel  from 09/18/2018 through 02/25/2019. -CT CAP on 04/10/2019 showed interval increase in moderate-sized bilateral pleural effusions, left more than right.  Multiple new masses in the liver more superiorly.  Moderate to large amount of ascites present. -Metal stent placement in CBD on 03/18/2019. -She had left thoracentesis and abdominal paracentesis during this hospitalization. -Her total bilirubin is between 4 and 5. -Bilirubin has been trending up.  MRCP on 04/26/2019 showed stable well-positioned CBD stent.  Mild intrahepatic biliary ductal dilatation, most prominent on the left liver lobe. -I had talked to Dr. Laural Golden.  He did not believe that there is complete occlusion of biliary stent but may have some degree of obstruction due to liver metastasis.  He would like to discuss with the patient. -If stent placement is deemed risky, we will consider  palliative chemotherapy upon discharge.  2.  Intractable nausea: -This is from underlying malignancy. -She is on Reglan 10 mg IV 3 times daily AC.  She is also receiving Protonix 40 mg IV daily. -She is on Phenergan 12.5 mg IV every 6 hours as needed.  All questions were answered. The patient knows to call the clinic with any problems, questions or concerns. We can certainly see the patient much sooner if necessary.     Derek Jack

## 2019-04-26 NOTE — Progress Notes (Signed)
PROGRESS NOTE    ALYZAE DECRESCENZO  [id number] DOB: Oct 22, 1970 DOA: 04/19/2019 PCP: Doree Albee, MD    Brief Narrative:  48 year old female who presented with cough and dyspnea.  She does have significant past medical history for metastatic breast cancer, depression and asthma.  She reported worsening productive cough, associated with difficulty breathing for about a week.  Positive pleuritic chest pain.  On her initial physical examination her heart rate was 119, oxygen saturation 89% on room air, blood pressure 131/86, respiratory rate 19.  Her lungs were clear to auscultation bilaterally, heart S1-S2 present, tachycardic, abdomen soft, trace lower extremity edema more left than right SARS COVID-19 negative.  Chest radiograph with faint infiltrate right upper lobe, atelectasis left lower lobe with small pleural effusion.   Patient was admitted to the hospital with the working diagnosis of acute hypoxic respiratory failure due to community-acquired pneumonia, present on admission.  She has been responding well to antibiotic therapy with levofloxacin. Underwent left pleural effusion thoracentesis with 980 fluid obtained.   Assessment & Plan:   Principal Problem:   Pneumonia Active Problems:   Asthma   Depression   Metastatic breast cancer (HCC)   Pleural effusion   Respiratory distress   Ascites   Malignant pleural effusion   1. Acute hypoxic respiratory failure due to right upper lobe community acquired pneumonia, complicated with left plural effusion. Patient complained of dyspnea and pleuritic chest pain, oximetry is 97% on 2 LPM per [place]. US guided thoracentesis to the left with 980 ml of yellow fluid.  Fluid analysis indicated transudative process.  She completed 5 days of intravenous levofloxacin for community-acquired pneumonia and does not have signs of further respiratory infection. Continue bronchodilator therapy.  Right-sided pleural effusion was evaluated and was not  felt to be significant enough to attempt thoracentesis.  It was noted that she has significant ascites and she underwent paracentesis which had improved her shortness of breath. I suspect her ascites and pleural effusions are due to her hypoalbuminemia and low oncotic pressures. She also received albumin infusions  2. Metastatic breast cancer/ liver metastais/elevated LFTs. On chemotherapy as outpatient, will follow as outpatient.  LFTs were elevated on admission.  She recently underwent stent placement by Dr. Laural Golden in 03/2019.  GI consulted, appreciate input.  Started on Reglan for nausea and vomiting.  MRCP performed today showed the biliary stent may be partially obstructed by liver mets.  It does not appear to be completely obstructed.  LFTs have been relatively stable.  Dr. Laural Golden will discuss with patient regarding the possibility of changing stent.  Discussed with Dr. [name] and will consult palliative care to help address goals of care and CODE STATUS.  3. Hypokalemia and hypomagnesemia.  Replaced. Renal function stable with serum cr at 0.5.   4. HTN.  Blood pressure stable on metoprolol  5. Asthma. Stable with no signs of exacerbation.   6.  Ascites.  Likely related to underlying liver disease/hypoalbuminemia.  Patient has undergone paracentesis x2 during her hospital stay.  On MRCP today, moderate to large volume ascites.  Will request repeat paracentesis in a.m.   DVT prophylaxis: enoxaparin   Code Status:  full Family Communication: no family at the bedside  Disposition Plan/ discharge barriers:  Pending clinical improvement.   Body mass index is 27.12 kg/m. Malnutrition Type:  Nutrition Problem: Severe Malnutrition Etiology: cancer and cancer related treatments(breast cancer with mets to liver; last chemotherapy 10/15)   Malnutrition Characteristics:  Signs/Symptoms: percent weight loss, moderate  fat depletion, severe fat depletion, moderate muscle depletion, severe  muscle depletion, edema, energy intake < or equal to 75% for > or equal to 1 month, per patient/family report Percent weight loss: 12.6 %(23.5 lbs x 3 months)   Nutrition Interventions:  Interventions: [place], Education  RN Pressure Injury Documentation:     Consultants:   Oncology  Interventional radiology   Gastroenterology  Procedures:   11/6 paracentesis with removal of 4.9 L of fluid  11/11 left thoracentesis with removal of 980 mL of fluid  11/13 paracentesis with removal of 4.7 L of fluid  Antimicrobials:   Levofloxacin 11/10 > 11/14   Subjective: Continues to have nausea and vomiting.  She did throw up once today feels generally weak.  Objective: Vitals:   04/25/19 2306 04/25/19 2308 04/26/19 0552 04/26/19 1554  BP: (!) 91/58 91/61 (!) 94/56 99/68  Pulse: 89  93 94  Resp: 16  20 18   Temp: 98.2 F (36.8 C)  97.8 F (36.6 C) 98.6 F (37 C)  TempSrc: Oral  Oral   SpO2: 95%  96% 94%  Weight:      Height:        Intake/Output Summary (Last 24 hours) at 04/26/2019 1939 Last data filed at 04/26/2019 1841 Gross per 24 hour  Intake 674.83 ml  Output -  Net 674.83 ml   Filed Weights   04/19/19 1521  Weight: 73.9 kg    Examination:   General exam: Alert, awake, oriented x 3 Respiratory system: Diminished breath sounds at bases. Respiratory effort normal. Cardiovascular system:RRR. No murmurs, rubs, gallops. Gastrointestinal system: Abdomen is distended, soft and nontender. No organomegaly or masses felt. Normal bowel sounds heard. Central nervous system: Alert and oriented. No focal neurological deficits. Extremities: 1+ edema bilaterally Skin: No rashes, lesions or ulcers Psychiatry: Judgement and insight appear normal. Mood & affect appropriate.     Data Reviewed: I have personally reviewed following labs and imaging studies  CBC: Recent Labs  Lab 04/20/19 0444 04/21/19 0612 04/22/19 0503 04/23/19 0420  WBC 9.3 9.8 9.5 10.1   NEUTROABS  --   --  7.6  --   HGB 10.7* 10.8* 11.1* 11.6*  HCT 33.5* 34.1* 34.5* 36.1  MCV 80.9 81.6 81.6 80.2  PLT 187 190 212 [id number]   Basic Metabolic Panel: Recent Labs  Lab 04/20/19 0444 04/21/19 0612 04/22/19 0503 04/23/19 0420 04/24/19 0650  NA 135 135 135 134* 134*  K 3.1* 4.0 4.0 3.8 3.7  CL 97* 100 100 99 98  CO2 27 27 25 25 26   GLUCOSE 85 98 101* 105* 78  BUN 7 9 10 9 10   CREATININE 0.53 0.52 0.57 0.59 0.47  CALCIUM 8.1* 8.3* 8.5* 8.5* 8.5*  MG 2.1 1.8 1.9  --   --    GFR: Estimated Creatinine Clearance: 84.7 mL/min (by C-G formula based on SCr of 0.47 mg/dL). Liver Function Tests: Recent Labs  Lab 04/21/19 0612 04/23/19 0420 04/24/19 0650 04/25/19 0631 04/26/19 0447  AST 126* 185* 152* 168* 124*  ALT 44 47* 41 47* 35  ALKPHOS 383* 508* 458* 511* 365*  BILITOT 3.1* 3.4* 4.0* 4.3* 5.5*  PROT 5.0* 6.0* 5.4* 5.4* 5.7*  ALBUMIN 2.0* 2.1* 1.9* 1.9* 3.3*   No results for input(s): LIPASE, AMYLASE in the last 168 hours. No results for input(s): AMMONIA in the last 168 hours. Coagulation Profile: No results for input(s): INR, PROTIME in the last 168 hours. Cardiac Enzymes: No results for input(s): CKTOTAL, CKMB, CKMBINDEX, TROPONINI  in the last 168 hours. BNP (last 3 results) No results for input(s): PROBNP in the last 8760 hours. HbA1C: No results for input(s): HGBA1C in the last 72 hours. CBG: No results for input(s): GLUCAP in the last 168 hours. Lipid Profile: No results for input(s): CHOL, HDL, LDLCALC, TRIG, CHOLHDL, LDLDIRECT in the last 72 hours. Thyroid Function Tests: No results for input(s): TSH, T4TOTAL, FREET4, T3FREE, THYROIDAB in the last 72 hours. Anemia Panel: No results for input(s): VITAMINB12, FOLATE, FERRITIN, TIBC, IRON, RETICCTPCT in the last 72 hours.    Radiology Studies: I have reviewed all of the imaging during this hospital visit personally     Scheduled Meds: . Chlorhexidine Gluconate Cloth  6 each Topical Daily  .  enoxaparin (LOVENOX) injection  40 mg Subcutaneous Q24H  . feeding supplement  1 Container Oral TID BM  . gabapentin  300 mg Oral QHS  . lubiprostone  8 mcg Oral BID WC  . metoCLOPramide (REGLAN) injection  10 mg Intravenous TID AC  . metoprolol tartrate  12.5 mg Oral BID  . pantoprazole (PROTONIX) IV  40 mg Intravenous Q24H   Continuous Infusions:    LOS: 7 days    Kathie Dike, MD

## 2019-04-27 ENCOUNTER — Encounter (HOSPITAL_COMMUNITY): Payer: Self-pay | Admitting: Anesthesiology

## 2019-04-27 ENCOUNTER — Inpatient Hospital Stay (HOSPITAL_COMMUNITY): Payer: 59

## 2019-04-27 ENCOUNTER — Encounter (HOSPITAL_COMMUNITY): Payer: Self-pay

## 2019-04-27 DIAGNOSIS — Z7189 Other specified counseling: Secondary | ICD-10-CM

## 2019-04-27 DIAGNOSIS — Z515 Encounter for palliative care: Secondary | ICD-10-CM

## 2019-04-27 LAB — COMPREHENSIVE METABOLIC PANEL
ALT: 50 U/L — ABNORMAL HIGH (ref 0–44)
AST: 192 U/L — ABNORMAL HIGH (ref 15–41)
Albumin: 3 g/dL — ABNORMAL LOW (ref 3.5–5.0)
Alkaline Phosphatase: 462 U/L — ABNORMAL HIGH (ref 38–126)
Anion gap: 10 (ref 5–15)
BUN: 9 mg/dL (ref 6–20)
CO2: 25 mmol/L (ref 22–32)
Calcium: 8.7 mg/dL — ABNORMAL LOW (ref 8.9–10.3)
Chloride: 99 mmol/L (ref 98–111)
Creatinine, Ser: 0.51 mg/dL (ref 0.44–1.00)
GFR calc Af Amer: >60 mL/min (ref >60)
GFR calc non Af Amer: >60 mL/min (ref >60)
Glucose, Bld: 93 mg/dL (ref 70–99)
Potassium: 3.4 mmol/L — ABNORMAL LOW (ref 3.5–5.1)
Sodium: 134 mmol/L — ABNORMAL LOW (ref 135–145)
Total Bilirubin: 7.7 mg/dL — ABNORMAL HIGH (ref 0.3–1.2)
Total Protein: 5.7 g/dL — ABNORMAL LOW (ref 6.5–8.1)

## 2019-04-27 IMAGING — US US PARACENTESIS
1 series · 1 of 1 positions shown · non-contrast
Comparison: none

INDICATION: Breast cancer, ascites

[Series 1: us paracentesis · 1 of 1 slices shown]
[im 1/1]
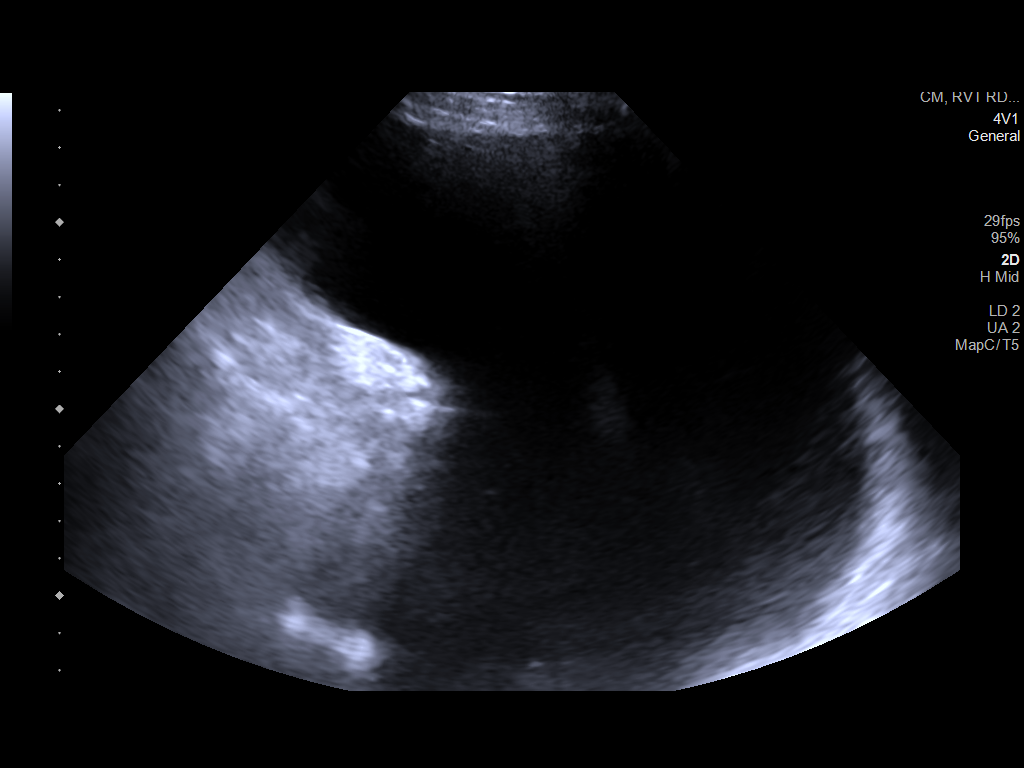

[1 of 1 positions shown; findings below may reference images not displayed]

EXAM:
ULTRASOUND GUIDED DIAGNOSTIC AND THERAPEUTIC PARACENTESIS

MEDICATIONS:
None

COMPLICATIONS:
None immediate

PROCEDURE:
Informed written consent was obtained from the patient after a
discussion of the risks, benefits and alternatives to treatment. A
timeout was performed prior to the initiation of the procedure.

Initial ultrasound scanning demonstrates a large amount of ascites
within the right lower abdominal quadrant. The right lower abdomen
was prepped and draped in the usual sterile fashion. 1% lidocaine
was used for local anesthesia.

Following this, a 5 French Yueh catheter was introduced. An
ultrasound image was saved for documentation purposes. The
paracentesis was performed. The catheter was removed and a dressing
was applied. The patient tolerated the procedure well without
immediate post procedural complication.
Patient received post-procedure intravenous albumin; see nursing
notes for details.
FINDINGS: A total of approximately 3.4 L of yellow ascitic fluid was removed.
Samples were sent to the laboratory as requested by the clinical
team.
IMPRESSION: Successful ultrasound-guided paracentesis yielding 3.4 liters of
peritoneal fluid.

## 2019-04-27 NOTE — Progress Notes (Signed)
Subjective:  Patient says she had better night.  She states if she is able to control her nausea she feels better.  Her cough is about the same.  She has cough if she takes a deep breath or lies flat.  She denies abdominal pain.  She feels abdomen is more distended yesterday.  She notes that she is to have abdominal tap today.   Objective: Blood pressure 101/69, pulse 96, temperature 98.7 F (37.1 C), temperature source Oral, resp. rate 20, height _0  (1.651 m), weight 73.9 kg, SpO2 94 %. Patient is alert and in no acute distress. Sclera is icteric. Heart exam with regular rhythm normal S1 and S2.  No murmur gallop noted. Auscultation lungs reveal vesicular breath sounds bilaterally. Abdomen is full.  Bowel sounds are normal.  On palpation it is soft.  She has mild tenderness in mid epigastric region.  No organomegaly or masses. LE edema is unchanged.'s about 1+.  Labs/studies Results:  CBC Latest Ref Rng & Units 04/23/2019 04/22/2019 04/21/2019  WBC 4.0 - 10.5 K/uL 10.1 9.5 9.8  Hemoglobin 12.0 - 15.0 g/dL 11.6(L) 11.1(L) 10.8(L)  Hematocrit 36.0 - 46.0 % 36.1 34.5(L) 34.1(L)  Platelets 150 - 400 K/uL 217 212 190    CMP Latest Ref Rng & Units 04/27/2019 04/26/2019 04/25/2019  Glucose 70 - 99 mg/dL 93 - -  BUN 6 - 20 mg/dL 9 - -  Creatinine 0.44 - 1.00 mg/dL 0.51 - -  Sodium 135 - 145 mmol/L 134(L) - -  Potassium 3.5 - 5.1 mmol/L 3.4(L) - -  Chloride 98 - 111 mmol/L 99 - -  CO2 22 - 32 mmol/L 25 - -  Calcium 8.9 - 10.3 mg/dL 8.7(L) - -  Total Protein 6.5 - 8.1 g/dL 5.7(L) 5.7(L) 5.4(L)  Total Bilirubin 0.3 - 1.2 mg/dL 7.7(H) 5.5(H) 4.3(H)  Alkaline Phos 38 - 126 U/L 462(H) 365(H) 511(H)  AST 15 - 41 U/L 192(H) 124(H) 168(H)  ALT 0 - 44 U/L 50(H) 35 47(H)    Hepatic Function Latest Ref Rng & Units 04/27/2019 04/26/2019 04/25/2019  Total Protein 6.5 - 8.1 g/dL 5.7(L) 5.7(L) 5.4(L)  Albumin 3.5 - 5.0 g/dL 3.0(L) 3.3(L) 1.9(L)  AST 15 - 41 U/L 192(H) 124(H) 168(H)  ALT 0 -  44 U/L 50(H) 35 47(H)  Alk Phosphatase 38 - 126 U/L 462(H) 365(H) 511(H)  Total Bilirubin 0.3 - 1.2 mg/dL 7.7(H) 5.5(H) 4.3(H)  Bilirubin, Direct 0.0 - 0.2 mg/dL - 2.9(H) 2.3(H)     Assessment:  #1.  Abnormal LFTs.  Bilirubin is going up.  She had MRCP yesterday.  It appears the proximal end of Wallstent is occluded by tumor.  However she does not have significant intrahepatic biliary dilation except in the left lobe.  Since bilirubin keeps rising and AP and transaminases remain elevated it would be reasonable to proceed with ERCP.  Since she does not have significant intrahepatic biliary dilation I may decide to stent either side with a plastic stent.  I may consider removing Wallstent as well.  I am still concerned if hepatic metastatic disease playing a role in abnormal LFTs as well. I would like to get Dr. Blythe Stanford input regarding her respiratory status before ERCP scheduled.  #2.  Right upper lobe pneumonia.  Patient is on levofloxacin.  She does not have dyspnea at rest.  #3.  Metastatic breast carcinoma with extensive liver involvement.  #4.  Ascites.  Suspect ascites secondary to metastatic breast carcinoma.  My other concern would be cirrhosis.  #  5.  Left pleural effusion.  She had thoracenteses on 04/21/2019.  Cytology is positive for malignant cells.   Recommendations  Abdominal paracenteses as planned. ERCP with stent change on 04/28/2019 unless Dr. Roderic Palau feels she is not stable from respiratory standpoint. Will hold enoxaparin this evening.

## 2019-04-27 NOTE — Progress Notes (Signed)
Paracentesis complete no signs of distress. 3.4L ascites removed.

## 2019-04-27 NOTE — Consult Note (Signed)
Consultation Note Date: 04/27/2019   Patient Name: Pamela Long  DOB: September 09, 1970  MRN: [id number]  Age / Sex: 48 y.o., female  PCP: Doree Albee, MD Referring Physician: Kathie Dike, MD  Reason for Consultation: Establishing goals of care  HPI/Patient Profile: 48 y.o. female  with past medical history of metastatic breast cancer, asthma, depression, anxietyadmitted on 04/19/2019 with SOB, cough d/t community acquired pneumonia. S/P left thoracentesis 04/21/19 cytology positive for malignant cells. Chemotherapy on hold s/t elevated LFTs last given 02/25/19 but continued progression on therapy. Now with worsening ascites and GI consulting for likely ERCP and potential stenting since bilirubin continues to increase. S/P paracentesis 04/23/19 4.7L and 04/16/19 4.9L.   Clinical Assessment and Goals of Care: I met today with Pamela Long. No family at bedside. Pamela Long is feeling pain and nausea today. She is very open with our discussion today. Pamela Long has clearly had a lot on her mind regarding her prognosis and decline and asks very good questions. She is clear that her motivation and main concern is for her 11 yo daughter. They have some friends/acquantainces but not much support and no family involved. Mainly just she and her daughter. She is hopeful to have some treatment options in hopes to have more time with her daughter and to support and prepare her daughter. She tells me that she is not sure that she would desire further treatment if it were just her and not for her daughter.   Pamela Long also asks about hospice and we discuss palliative vs hospice services. She is open to both and what is most appropriate for her at any given time. Other than her daughter she is most concerned about suffering and the unknown of what comes next. We discussed palliative and hospice to support her symptoms and QOL and help support both  her and her daughter in trying to be prepared and what to expect.   We discussed Living Will and I left copy in her room to review. She inquires about further wills and I provided information regarding holographic will (handwritten and signed). I also provided information regarding grief/bereavement counseling (via hospice) for she and her daughter and encouraged her to connect her daughter with these services sooner than later. She has been talking to her daughter about her progression. We further discussed code status in regards to what Pamela Long wishes to go through and also concern of decisions that could face her daughter in this situation. Pamela Long will continue to think about these decisions and I will follow up with her tomorrow. She was becoming sleepy from antiemetic.    I will follow up tomorrow.   Primary Decision Maker PATIENT  Code Status/Advance Care Planning:  Full code - considering her options   Symptom Management:   Pain:   Gabapentin 300 mg qhs.   Oxycodone-acetaminophen 5-325 mg every 6 hours prn.   Morphine 2 mg every hour prn.   No changes until after procedures the next couple days.   Nausea: Phenergan 12.5 mg every 6 hours prn.  Bowel Regimen: LBM 11/16. Miralax daily prn.   Palliative Prophylaxis:   Bowel Regimen, Delirium Protocol and Frequent Pain Assessment  Psycho-social/Spiritual:   Desire for further Chaplaincy support:yes  Additional Recommendations: Caregiving  Support/Resources, Education on Hospice and Grief/Bereavement Support  Prognosis:   < 6 months very likely  Discharge Planning: To Be Determined      Primary Diagnoses: Present on Admission: . Pneumonia . Asthma . Depression . Metastatic breast cancer ([place])   I have reviewed the medical record, interviewed the patient and family, and examined the patient. The following aspects are pertinent.  Past Medical History:  Diagnosis Date  . Anemia   . Anxiety   . Asthma   .  Breast cancer ([place])    Left, 2017  . Complication of anesthesia   . Depression   . GERD (gastroesophageal reflux disease)   . Migraine   . OAB (overactive bladder)   . PONV (postoperative nausea and vomiting)   . Seasonal allergies    Social History   Socioeconomic History  . Marital status: Single    Spouse name: Not on file  . Number of children: 1  . Years of education: 44  . Highest education level: Not on file  Occupational History  . Occupation: disabled  Social Needs  . Financial resource strain: Somewhat hard  . Food insecurity    Worry: Sometimes true    Inability: Sometimes true  . Transportation needs    Medical: No    Non-medical: No  Tobacco Use  . Smoking status: Never Smoker  . Smokeless tobacco: Never Used  Substance and Sexual Activity  . Alcohol use: No  . Drug use: No  . Sexual activity: Not Currently  Lifestyle  . Physical activity    Days per week: 0 days    Minutes per session: 0 min  . Stress: Rather much  Relationships  . Social Herbalist on phone: Once a week    Gets together: Once a week    Attends religious service: 1 to 4 times per year    Active member of club or organization: Yes    Attends meetings of clubs or organizations: Never    Relationship status: Never married  Other Topics Concern  . Not on file  Social History Narrative   Bachelors degree   Accounting   Lives with daughter [name] who has autism and diabetes   Likes to sew, quilt, crafts, crochet   Family History  Problem Relation Age of Onset  . Arthritis Mother   . COPD Mother   . Depression Mother   . Diabetes Mother   . Kidney disease Father   . Heart disease Father 78  . Drug abuse Father   . Hypertension Father   . Diabetes Daughter   . Hashimoto's thyroiditis Daughter   . Irritable bowel syndrome Daughter   . Autism spectrum disorder Daughter   . Early death Maternal Grandmother        drowned  . Early death Maternal Grandfather   .  Heart disease Maternal Grandfather   . Heart disease Paternal Grandfather   . Breast cancer Maternal Aunt   . Breast cancer Cousin   . Lung cancer Maternal Aunt   . Lung cancer Maternal Uncle   . Colon cancer Neg Hx    Scheduled Meds: . Chlorhexidine Gluconate Cloth  6 each Topical Daily  . enoxaparin (LOVENOX) injection  40 mg Subcutaneous Q24H  . feeding supplement  1 Container  Oral TID BM  . gabapentin  300 mg Oral QHS  . lubiprostone  8 mcg Oral BID WC  . metoCLOPramide (REGLAN) injection  10 mg Intravenous TID AC  . metoprolol tartrate  12.5 mg Oral BID  . pantoprazole (PROTONIX) IV  40 mg Intravenous Q12H   Continuous Infusions: PRN Meds:.acetaminophen **OR** acetaminophen, albuterol, alum & mag hydroxide-simeth, guaiFENesin-dextromethorphan, morphine injection, oxyCODONE-acetaminophen, polyethylene glycol, promethazine, temazepam Allergies  Allergen Reactions  . Salagen [Pilocarpine] Other (See Comments)    Can cause liver failure   . Tape Itching and Other (See Comments)    Depending on the adhesive-blistering occurs  . Amoxicillin Hives and Other (See Comments)    Rash only DID THE REACTION INVOLVE: Swelling of the face/tongue/throat, SOB, or low BP? Sudden or severe rash/hives, skin peeling, or the inside of the mouth or nose?  Did it require medical treatment?  When did it last happen? If all above answers are "NO", may proceed with cephalosporin use.   . Caffeine Diarrhea, Nausea Only, Palpitations and Other (See Comments)    Headache  . Tetanus Toxoids Swelling and Other (See Comments)    Local reaction   Review of Systems  Constitutional: Positive for activity change, appetite change and fatigue.  Respiratory: Positive for shortness of breath.   Gastrointestinal: Positive for abdominal pain and nausea.    Physical Exam Vitals signs and nursing note reviewed.  Constitutional:      General: She is not in acute distress.    Appearance: She is  ill-appearing.  Cardiovascular:     Rate and Rhythm: Normal rate.  Pulmonary:     Effort: Pulmonary effort is normal. No tachypnea, accessory muscle usage or respiratory distress.  Abdominal:     General: There is distension.  Skin:    Coloration: Skin is pale.  Neurological:     Mental Status: She is alert and oriented to person, place, and time.  Psychiatric:     Comments: Flat affect     Vital Signs: BP 101/69 (BP Location: Right Arm)   Pulse 96   Temp 98.7 F (37.1 C) (Oral)   Resp 20   Ht 5' 5" (1.651 m)   Wt 73.9 kg   SpO2 94%   BMI 27.12 kg/m  Pain Scale: 0-10 POSS *See Group Information*: 1-Acceptable,Awake and alert Pain Score: 0-No pain   SpO2: SpO2: 94 % O2 Device:SpO2: 94 % O2 Flow Rate: .O2 Flow Rate (L/min): 2 L/min  IO: Intake/output summary:   Intake/Output Summary (Last 24 hours) at 04/27/2019 4128 Last data filed at 04/27/2019 0900 Gross per 24 hour  Intake 480 ml  Output 100 ml  Net 380 ml    LBM: Last BM Date: 04/26/19 Baseline Weight: Weight: 73.9 kg Most recent weight: Weight: 73.9 kg     Palliative Assessment/Data:     Time In: 1000 Time Out: 1100 Time Total: 60 min Greater than 50%  of this time was spent counseling and coordinating care related to the above assessment and plan.  Signed by: Vinie Sill, NP Palliative Medicine Team Pager # [phone] (M-F 8a-5p) Team Phone # [phone] (Nights/Weekends)

## 2019-04-27 NOTE — Consult Note (Signed)
[facility] Oncology Progress Note  Name: Pamela Long      MRN: [id number]    Location: A305/A305-01  Date: 04/27/2019 Time:5:46 PM   Subjective: Interval History:Alissia S Deerman was seen on rounds today.  She is lying in bed and appears to be no acute distress.  She had abdominal paracentesis done today.  She still feeling nauseous, which got worse after the procedure.  She has not eaten her dinner yet.  She does not report any difficulty breathing.  No vomiting reported.  No new pains reported.  Objective: Vital signs in last 24 hours: Temp:  [98.6 F (37 C)-98.7 F (37.1 C)] 98.7 F (37.1 C) (11/17 0636) Pulse Rate:  [92-100] 95 (11/17 1409) Resp:  [18-20] 18 (11/17 1409) BP: (93-102)/(64-78) 98/67 (11/17 1409) SpO2:  [92 %-99 %] 99 % (11/17 1409)    Intake/Output from previous day: 11/16 0800 - 11/17 0759 In: 480 [P.O.:480] Out: 100 [Urine:100]    Intake/Output this shift: Total I/O In: 480 [P.O.:480] Out: -    PHYSICAL EXAM: BP 98/67 (BP Location: Right Arm)   Pulse 95   Temp 98.7 F (37.1 C) (Oral)   Resp 18   Ht '5\' 5"'  (1.651 m)   Wt 163 lb (73.9 kg)   SpO2 99%   BMI 27.12 kg/m  General appearance: alert, cooperative and appears stated age Lungs: Bilateral air entry with decreased breath sounds at bases. Heart: regular rate and rhythm Abdomen: Soft, tenderness in the upper quadrant on the right side.  Bowel sounds present. Extremities: extremities normal, atraumatic, no cyanosis or edema Skin: Skin color, texture, turgor normal. No rashes or lesions Neurologic: Grossly normal   Studies/Results: Results for orders placed or performed during the hospital encounter of 04/19/19 (from the past 48 hour(s))  Hepatic function panel     Status: Abnormal   Collection Time: 04/26/19  4:47 AM  Result Value Ref Range   Total Protein 5.7 (L) 6.5 - 8.1 g/dL   Albumin 3.3 (L) 3.5 - 5.0 g/dL   AST 124 (H) 15 - 41 U/L   ALT 35 0 - 44 U/L   Alkaline  Phosphatase 365 (H) 38 - 126 U/L   Total Bilirubin 5.5 (H) 0.3 - 1.2 mg/dL   Bilirubin, Direct 2.9 (H) 0.0 - 0.2 mg/dL   Indirect Bilirubin 2.6 (H) 0.3 - 0.9 mg/dL    Comment: Performed at [facility], [address] [place], [place] [address]  Urinalysis, Routine w reflex microscopic     Status: Abnormal   Collection Time: 04/26/19 10:14 AM  Result Value Ref Range   Color, Urine AMBER (A) YELLOW    Comment: BIOCHEMICALS MAY BE AFFECTED BY COLOR   APPearance HAZY (A) CLEAR   Specific Gravity, Urine 1.028 1.005 - 1.030   pH 5.0 5.0 - 8.0   Glucose, UA NEGATIVE NEGATIVE mg/dL   Hgb urine dipstick NEGATIVE NEGATIVE   Bilirubin Urine MODERATE (A) NEGATIVE   Ketones, ur NEGATIVE NEGATIVE mg/dL   Protein, ur 30 (A) NEGATIVE mg/dL   Nitrite NEGATIVE NEGATIVE   Leukocytes,Ua TRACE (A) NEGATIVE   RBC / HPF 0-5 0 - 5 RBC/hpf   WBC, UA 6-10 0 - 5 WBC/hpf   Bacteria, UA RARE (A) NONE SEEN   Squamous Epithelial / LPF 0-5 0 - 5   Mucus PRESENT     Comment: Performed at [facility], [address], [place],  [address]  Comprehensive metabolic panel     Status: Abnormal  Collection Time: 04/27/19  5:53 AM  Result Value Ref Range   Sodium 134 (L) 135 - 145 mmol/L   Potassium 3.4 (L) 3.5 - 5.1 mmol/L   Chloride 99 98 - 111 mmol/L   CO2 25 22 - 32 mmol/L   Glucose, Bld 93 70 - 99 mg/dL   BUN 9 6 - 20 mg/dL   Creatinine, Ser 0.51 0.44 - 1.00 mg/dL   Calcium 8.7 (L) 8.9 - 10.3 mg/dL   Total Protein 5.7 (L) 6.5 - 8.1 g/dL   Albumin 3.0 (L) 3.5 - 5.0 g/dL   AST 192 (H) 15 - 41 U/L   ALT 50 (H) 0 - 44 U/L   Alkaline Phosphatase 462 (H) 38 - 126 U/L   Total Bilirubin 7.7 (H) 0.3 - 1.2 mg/dL   GFR calc non Af Amer >60 >60 mL/min   GFR calc Af Amer >60 >60 mL/min   Anion gap 10 5 - 15    Comment: Performed at [facility], [address], [place], [place] [address]   Dg Chest 2 View  Result Date: 04/26/2019 CLINICAL DATA:  Short of breath.  History of breast cancer. EXAM: CHEST - 2  VIEW COMPARISON:  04/22/2019 FINDINGS: Small left pleural effusion and left lower lobe airspace disease unchanged. Right lower lobe mild airspace disease also unchanged. Mild right upper lobe airspace disease unchanged. Port-A-Cath tip SVC unchanged. IMPRESSION: Bilateral airspace disease unchanged. Small left effusion unchanged. Electronically Signed   By: Franchot Gallo M.D.   On: 04/26/2019 09:04   Mr Abdomen Mrcp Wo Contrast  Result Date: 04/26/2019 CLINICAL DATA:  Inpatient. Metastatic breast cancer to the liver with history of biliary obstruction requiring CBD stent. Patient now with worsening diffuse chest pain and abdominal distention for 2 weeks with ongoing chemotherapy. EXAM: MRI ABDOMEN WITHOUT CONTRAST  (INCLUDING MRCP) TECHNIQUE: Multiplanar multisequence MR imaging of the abdomen was performed. Heavily T2-weighted images of the biliary and pancreatic ducts were obtained, and three-dimensional MRCP images were rendered by post processing. COMPARISON:  09/18/2018 MRI abdomen.  04/10/2019 CT abdomen/pelvis. FINDINGS: Lower chest: Small to moderate dependent bilateral pleural effusions, increased on the right and similar on the left since 04/10/2019 CT study. Hepatobiliary: Liver is enlarged by numerous (at least 10) similar liver masses scattered throughout the liver, all with signal intensity similar to the spleen (mildly T2 hyperintense and T1 hypointense), compatible with liver metastases. Representative liver masses as follows: -segment 4b/5 inferior liver 9.5 x 6.8 cm mass (series 9/image 43), previously 9.6 x 6.9 cm on 04/10/2019 CT using similar measurement technique, not substantially changed -segment 4A left liver lobe 8.1 x 6.6 cm mass (series 9/image 30), previously 7.2 x 6.1 cm on 04/10/2019 CT using similar measurement technique, mildly increased -segment 8 right liver dome 1.9 x 1.9 cm mass (series 9/ image 21), previously 1.5 x 1.2 cm, mildly increased -far inferior right liver 2.8 x  2.4 cm mass (series 9/image 50), previously 2.5 x 2.1 cm, mildly increased No hepatic steatosis. Decompressed gallbladder. No definite gallbladder wall thickening. Small amount layering gallbladder sludge with no convincing gallstones. Well-positioned CBD stent in stable position with the distal tip in the duodenal lumen just beyond the ampulla. Mild intrahepatic biliary ductal dilatation throughout segments 2, 3 and 4A of the left liver lobe and minimal intrahepatic biliary ductal dilatation in the inferior central right liver lobe, not substantially changed since 04/10/2019 CT. Common bile duct diameter 10 mm. Gas and small amount of layering fluid noted in CBD  stent. Pancreas: No pancreatic mass or duct dilation.  No pancreas divisum. Spleen: Normal size. No mass. Adrenals/Urinary Tract: Normal adrenals. No hydronephrosis. Normal kidneys with no renal mass. Stomach/Bowel: Normal non-distended stomach. Visualized small and large bowel is normal caliber, with no bowel wall thickening. Vascular/Lymphatic: Normal caliber abdominal aorta. No pathologically enlarged lymph nodes in the abdomen. Other: Moderate to large volume abdominal ascites, mildly increased. Musculoskeletal: No aggressive appearing focal osseous lesions. IMPRESSION: 1. Mild progression of hepatic metastatic disease since 04/10/2019 CT as detailed. 2. Stable well-positioned CBD stent. Mild intrahepatic biliary ductal dilatation, most prominent in the left liver lobe, not substantially changed since 04/10/2019 CT. 3. Moderate to large volume abdominal ascites, mildly increased. 4. Small to moderate dependent bilateral pleural effusions, increased on the right and stable on the left. Electronically Signed   By: Ilona Sorrel M.D.   On: 04/26/2019 10:00   Mr 3d Recon At Scanner  Result Date: 04/26/2019 CLINICAL DATA:  Inpatient. Metastatic breast cancer to the liver with history of biliary obstruction requiring CBD stent. Patient now with worsening  diffuse chest pain and abdominal distention for 2 weeks with ongoing chemotherapy. EXAM: MRI ABDOMEN WITHOUT CONTRAST  (INCLUDING MRCP) TECHNIQUE: Multiplanar multisequence MR imaging of the abdomen was performed. Heavily T2-weighted images of the biliary and pancreatic ducts were obtained, and three-dimensional MRCP images were rendered by post processing. COMPARISON:  09/18/2018 MRI abdomen.  04/10/2019 CT abdomen/pelvis. FINDINGS: Lower chest: Small to moderate dependent bilateral pleural effusions, increased on the right and similar on the left since 04/10/2019 CT study. Hepatobiliary: Liver is enlarged by numerous (at least 10) similar liver masses scattered throughout the liver, all with signal intensity similar to the spleen (mildly T2 hyperintense and T1 hypointense), compatible with liver metastases. Representative liver masses as follows: -segment 4b/5 inferior liver 9.5 x 6.8 cm mass (series 9/image 43), previously 9.6 x 6.9 cm on 04/10/2019 CT using similar measurement technique, not substantially changed -segment 4A left liver lobe 8.1 x 6.6 cm mass (series 9/image 30), previously 7.2 x 6.1 cm on 04/10/2019 CT using similar measurement technique, mildly increased -segment 8 right liver dome 1.9 x 1.9 cm mass (series 9/ image 21), previously 1.5 x 1.2 cm, mildly increased -far inferior right liver 2.8 x 2.4 cm mass (series 9/image 50), previously 2.5 x 2.1 cm, mildly increased No hepatic steatosis. Decompressed gallbladder. No definite gallbladder wall thickening. Small amount layering gallbladder sludge with no convincing gallstones. Well-positioned CBD stent in stable position with the distal tip in the duodenal lumen just beyond the ampulla. Mild intrahepatic biliary ductal dilatation throughout segments 2, 3 and 4A of the left liver lobe and minimal intrahepatic biliary ductal dilatation in the inferior central right liver lobe, not substantially changed since 04/10/2019 CT. Common bile duct diameter  10 mm. Gas and small amount of layering fluid noted in CBD stent. Pancreas: No pancreatic mass or duct dilation.  No pancreas divisum. Spleen: Normal size. No mass. Adrenals/Urinary Tract: Normal adrenals. No hydronephrosis. Normal kidneys with no renal mass. Stomach/Bowel: Normal non-distended stomach. Visualized small and large bowel is normal caliber, with no bowel wall thickening. Vascular/Lymphatic: Normal caliber abdominal aorta. No pathologically enlarged lymph nodes in the abdomen. Other: Moderate to large volume abdominal ascites, mildly increased. Musculoskeletal: No aggressive appearing focal osseous lesions. IMPRESSION: 1. Mild progression of hepatic metastatic disease since 04/10/2019 CT as detailed. 2. Stable well-positioned CBD stent. Mild intrahepatic biliary ductal dilatation, most prominent in the left liver lobe, not substantially changed since 04/10/2019 CT.  3. Moderate to large volume abdominal ascites, mildly increased. 4. Small to moderate dependent bilateral pleural effusions, increased on the right and stable on the left. Electronically Signed   By: Ilona Sorrel M.D.   On: 04/26/2019 10:00   US Paracentesis  Result Date: 04/27/2019 INDICATION: Breast cancer, ascites EXAM: ULTRASOUND GUIDED DIAGNOSTIC AND THERAPEUTIC PARACENTESIS MEDICATIONS: None COMPLICATIONS: None immediate PROCEDURE: Informed written consent was obtained from the patient after a discussion of the risks, benefits and alternatives to treatment. A timeout was performed prior to the initiation of the procedure. Initial ultrasound scanning demonstrates a large amount of ascites within the right lower abdominal quadrant. The right lower abdomen was prepped and draped in the usual sterile fashion. 1% lidocaine was used for local anesthesia. Following this, a 5 [place] Yueh catheter was introduced. An ultrasound image was saved for documentation purposes. The paracentesis was performed. The catheter was removed and a dressing  was applied. The patient tolerated the procedure well without immediate post procedural complication. Patient received post-procedure intravenous albumin; see nursing notes for details. FINDINGS: A total of approximately 3.4 L of yellow ascitic fluid was removed. Samples were sent to the laboratory as requested by the clinical team. IMPRESSION: Successful ultrasound-guided paracentesis yielding 3.4 liters of peritoneal fluid. Electronically Signed   By: Lavonia Dana M.D.   On: 04/27/2019 15:21     MEDICATIONS: I have reviewed the patient's current medications.     Assessment/Plan:  1.  Metastatic breast cancer to the liver, ER/PR positive and HER-2 negative: -Progression on paclitaxel from 09/18/2018 through 02/25/2019. -Metal stent placement in CBD on 03/18/2019. -CT CAP on 04/10/2019 showed increasing moderate-sized bilateral pleural effusions, left more than right.  Multiple new mass in the liver more superiorly. -She had left thoracentesis and abdominal paracentesis during this hospitalization. -Today her bilirubin went up to 7. -MRCP on 04/26/2019 showed stable well-positioned CBD stent with mild intrahepatic biliary ductal dilation, most prominent on the left lower lobe. -She had repeat abdominal paracentesis today with 3.4 L of yellow ascitic fluid removed. -She will likely have procedure done tomorrow with possible removal of Wallstent and placement of 2 plastic stents by Dr.Rehman. -We will follow up after the procedure regarding further plan.  2.  Intractable nausea: -This is from underlying malignancy. -She is on Reglan 10 mg IV 3 times daily and AC.  She is also receiving Protonix. -She is on Phenergan IV every 6 hours as needed.  All questions were answered. The patient knows to call the clinic with any problems, questions or concerns. We can certainly see the patient much sooner if necessary.     Derek Jack

## 2019-04-27 NOTE — Procedures (Signed)
PreOperative Dx: Ascites Postoperative Dx: Ascites Procedure:   US guided paracentesis Radiologist:  Waino Mounsey Anesthesia:  10 ml of1% lidocaine Specimen:  3.4 L of yellow ascitic fluid EBL:   < 1 ml Complications: None   

## 2019-04-27 NOTE — Progress Notes (Signed)
PROGRESS NOTE    Pamela Long  [id number] DOB: 03-26-1971 DOA: 04/19/2019 PCP: Doree Albee, MD    Brief Narrative:  48 year old female who presented with cough and dyspnea.  She does have significant past medical history for metastatic breast cancer, depression and asthma.  She reported worsening productive cough, associated with difficulty breathing for about a week.  Positive pleuritic chest pain.  On her initial physical examination her heart rate was 119, oxygen saturation 89% on room air, blood pressure 131/86, respiratory rate 19.  Her lungs were clear to auscultation bilaterally, heart S1-S2 present, tachycardic, abdomen soft, trace lower extremity edema more left than right SARS COVID-19 negative.  Chest radiograph with faint infiltrate right upper lobe, atelectasis left lower lobe with small pleural effusion.   Patient was admitted to the hospital with the working diagnosis of acute hypoxic respiratory failure due to community-acquired pneumonia, present on admission.  She has been responding well to antibiotic therapy with levofloxacin. Underwent left pleural effusion thoracentesis with 980 fluid obtained.   Assessment & Plan:   Principal Problem:   Pneumonia Active Problems:   Asthma   Depression   Metastatic breast cancer (HCC)   Pleural effusion   Respiratory distress   Ascites   Malignant pleural effusion   1. Acute hypoxic respiratory failure due to right upper lobe community acquired pneumonia, complicated with left plural effusion. Patient complained of dyspnea and pleuritic chest pain, oximetry is 97% on 2 LPM per [place]. US guided thoracentesis to the left with 980 ml of yellow fluid.  Fluid analysis indicated transudative process.  She completed 5 days of intravenous levofloxacin for community-acquired pneumonia and does not have signs of further respiratory infection. Continue bronchodilator therapy.  Right-sided pleural effusion was evaluated and was not  felt to be significant enough to attempt thoracentesis.  It was noted that she has significant ascites and she has undergone multiple paracentesis which had improved her shortness of breath.  Pleural fluid cytology was positive for malignant cells.  2. Metastatic breast cancer/ liver metastais/elevated LFTs. On chemotherapy as outpatient, will follow as outpatient.  LFTs were elevated on admission.  She recently underwent stent placement by Dr. Laural Golden in 03/2019.  GI consulted, appreciate input.  Started on Reglan for nausea and vomiting.  MRCP showed the biliary stent may be partially obstructed by liver mets.  It does not appear to be completely obstructed.  LFTs currently trending up.  Dr. Laural Golden has discussed with the patient we will plan on ERCP on 11/18 for stent exchange.  Oncology is also following and will help develop further plan after procedure.  Palliative care following to help address goals of care.  3. Hypokalemia and hypomagnesemia.  Replaced. Renal function stable with serum cr at 0.5.   4. HTN.  Blood pressure stable on metoprolol  5. Asthma. Stable with no signs of exacerbation.   6.  Ascites.  Likely related to underlying liver disease/hypoalbuminemia.  May also be related to underlying malignant metastatic disease.  Patient has undergone paracentesis x3 during her hospital stay.  Have requested that ascitic fluid be sent for cytology.   DVT prophylaxis: enoxaparin   Code Status:  full Family Communication: no family at the bedside  Disposition Plan/ discharge barriers:  Pending clinical improvement.   Body mass index is 27.12 kg/m. Malnutrition Type:  Nutrition Problem: Severe Malnutrition Etiology: cancer and cancer related treatments(breast cancer with mets to liver; last chemotherapy 10/15)   Malnutrition Characteristics:  Signs/Symptoms: percent weight loss, moderate  fat depletion, severe fat depletion, moderate muscle depletion, severe muscle depletion, edema,  energy intake < or equal to 75% for > or equal to 1 month, per patient/family report Percent weight loss: 12.6 %(23.5 lbs x 3 months)   Nutrition Interventions:  Interventions: [place]ze, Education  RN Pressure Injury Documentation:     Consultants:   Oncology  Interventional radiology   Gastroenterology  Palliative care  Procedures:   11/6 paracentesis with removal of 4.9 L of fluid  11/11 left thoracentesis with removal of 980 mL of fluid  11/13 paracentesis with removal of 4.7 L of fluid  11/17 paracentesis with removal of 3.4 L of fluid  Antimicrobials:   Levofloxacin 11/10 > 11/14   Subjective: Continues to have nausea and vomiting.  She did throw up once today feels generally weak.  Objective: Vitals:   04/26/19 2045 04/27/19 0636 04/27/19 1333 04/27/19 1409  BP: 102/64 101/69 93/78 98/67   Pulse: 100 96 92 95  Resp:  20 18 18   Temp: 98.6 F (37 C) 98.7 F (37.1 C)    TempSrc: Oral Oral    SpO2: 92% 94% 99% 99%  Weight:      Height:        Intake/Output Summary (Last 24 hours) at 04/27/2019 2137 Last data filed at 04/27/2019 1500 Gross per 24 hour  Intake 480 ml  Output 100 ml  Net 380 ml   Filed Weights   04/19/19 1521  Weight: 73.9 kg    Examination:   General exam: Alert, awake, oriented x 3 Respiratory system: Diminished breath sounds at bases. Respiratory effort normal. Cardiovascular system:RRR. No murmurs, rubs, gallops. Gastrointestinal system: Abdomen is distended, soft and nontender. No organomegaly or masses felt. Normal bowel sounds heard. Central nervous system: Alert and oriented. No focal neurological deficits. Extremities: 1+ edema bilaterally Skin: No rashes, lesions or ulcers Psychiatry: Judgement and insight appear normal. Mood & affect appropriate.     Data Reviewed: I have personally reviewed following labs and imaging studies  CBC: Recent Labs  Lab 04/21/19 0612 04/22/19 0503 04/23/19 0420  WBC 9.8  9.5 10.1  NEUTROABS  --  7.6  --   HGB 10.8* 11.1* 11.6*  HCT 34.1* 34.5* 36.1  MCV 81.6 81.6 80.2  PLT 190 212 [id number]   Basic Metabolic Panel: Recent Labs  Lab 04/21/19 0612 04/22/19 0503 04/23/19 0420 04/24/19 0650 04/27/19 0553  NA 135 135 134* 134* 134*  K 4.0 4.0 3.8 3.7 3.4*  CL 100 100 99 98 99  CO2 27 25 25 26 25   GLUCOSE 98 101* 105* 78 93  BUN 9 10 9 10 9   CREATININE 0.52 0.57 0.59 0.47 0.51  CALCIUM 8.3* 8.5* 8.5* 8.5* 8.7*  MG 1.8 1.9  --   --   --    GFR: Estimated Creatinine Clearance: 84.7 mL/min (by C-G formula based on SCr of 0.51 mg/dL). Liver Function Tests: Recent Labs  Lab 04/23/19 0420 04/24/19 0650 04/25/19 0631 04/26/19 0447 04/27/19 0553  AST 185* 152* 168* 124* 192*  ALT 47* 41 47* 35 50*  ALKPHOS 508* 458* 511* 365* 462*  BILITOT 3.4* 4.0* 4.3* 5.5* 7.7*  PROT 6.0* 5.4* 5.4* 5.7* 5.7*  ALBUMIN 2.1* 1.9* 1.9* 3.3* 3.0*   No results for input(s): LIPASE, AMYLASE in the last 168 hours. No results for input(s): AMMONIA in the last 168 hours. Coagulation Profile: No results for input(s): INR, PROTIME in the last 168 hours. Cardiac Enzymes: No results for input(s): CKTOTAL, CKMB, CKMBINDEX,  TROPONINI in the last 168 hours. BNP (last 3 results) No results for input(s): PROBNP in the last 8760 hours. HbA1C: No results for input(s): HGBA1C in the last 72 hours. CBG: No results for input(s): GLUCAP in the last 168 hours. Lipid Profile: No results for input(s): CHOL, HDL, LDLCALC, TRIG, CHOLHDL, LDLDIRECT in the last 72 hours. Thyroid Function Tests: No results for input(s): TSH, T4TOTAL, FREET4, T3FREE, THYROIDAB in the last 72 hours. Anemia Panel: No results for input(s): VITAMINB12, FOLATE, FERRITIN, TIBC, IRON, RETICCTPCT in the last 72 hours.    Radiology Studies: I have reviewed all of the imaging during this hospital visit personally     Scheduled Meds: . Chlorhexidine Gluconate Cloth  6 each Topical Daily  . feeding  supplement  1 Container Oral TID BM  . gabapentin  300 mg Oral QHS  . lubiprostone  8 mcg Oral BID WC  . metoCLOPramide (REGLAN) injection  10 mg Intravenous TID AC  . metoprolol tartrate  12.5 mg Oral BID  . pantoprazole (PROTONIX) IV  40 mg Intravenous Q12H   Continuous Infusions:    LOS: 8 days    Kathie Dike, MD

## 2019-04-28 ENCOUNTER — Encounter (HOSPITAL_COMMUNITY)
Admission: EM | Disposition: A | Discharge status: Hospice | Payer: Self-pay | Source: Home / Self Care | Attending: Family Medicine

## 2019-04-28 DIAGNOSIS — G893 Neoplasm related pain (acute) (chronic): Secondary | ICD-10-CM | POA: Diagnosis present

## 2019-04-28 DIAGNOSIS — R112 Nausea with vomiting, unspecified: Secondary | ICD-10-CM

## 2019-04-28 LAB — COMPREHENSIVE METABOLIC PANEL
ALT: 58 U/L — ABNORMAL HIGH (ref 0–44)
AST: 209 U/L — ABNORMAL HIGH (ref 15–41)
Albumin: 2.7 g/dL — ABNORMAL LOW (ref 3.5–5.0)
Alkaline Phosphatase: 500 U/L — ABNORMAL HIGH (ref 38–126)
Anion gap: 11 (ref 5–15)
BUN: 10 mg/dL (ref 6–20)
CO2: 25 mmol/L (ref 22–32)
Calcium: 8.8 mg/dL — ABNORMAL LOW (ref 8.9–10.3)
Chloride: 99 mmol/L (ref 98–111)
Creatinine, Ser: 0.44 mg/dL (ref 0.44–1.00)
GFR calc Af Amer: >60 mL/min (ref >60)
GFR calc non Af Amer: >60 mL/min (ref >60)
Glucose, Bld: 101 mg/dL — ABNORMAL HIGH (ref 70–99)
Potassium: 3.5 mmol/L (ref 3.5–5.1)
Sodium: 135 mmol/L (ref 135–145)
Total Bilirubin: 9.3 mg/dL — ABNORMAL HIGH (ref 0.3–1.2)
Total Protein: 5.6 g/dL — ABNORMAL LOW (ref 6.5–8.1)

## 2019-04-28 LAB — CBC
HCT: 33.4 % — ABNORMAL LOW (ref 36.0–46.0)
Hemoglobin: 11.1 g/dL — ABNORMAL LOW (ref 12.0–15.0)
MCH: 26.6 pg (ref 26.0–34.0)
MCHC: 33.2 g/dL (ref 30.0–36.0)
MCV: 80.1 fL (ref 80.0–100.0)
Platelets: 189 10*3/uL (ref 150–400)
RBC: 4.17 MIL/uL (ref 3.87–5.11)
RDW: 23.9 % — ABNORMAL HIGH (ref 11.5–15.5)
WBC: 9.2 10*3/uL (ref 4.0–10.5)
nRBC: 0 % (ref 0.0–0.2)

## 2019-04-28 LAB — PROTIME-INR
INR: 1.3 — ABNORMAL HIGH (ref 0.8–1.2)
Prothrombin Time: 16.2 seconds — ABNORMAL HIGH (ref 11.4–15.2)

## 2019-04-28 LAB — CYTOLOGY - NON PAP

## 2019-04-28 SURGERY — ERCP, WITH INTERVENTION IF INDICATED
Anesthesia: General

## 2019-04-28 MED ORDER — GLYCOPYRROLATE 0.2 MG/ML IJ SOLN: 0.4000 mg | INTRAMUSCULAR | Status: DC | PRN

## 2019-04-28 MED ORDER — PROMETHAZINE HCL 25 MG/ML IJ SOLN
12.5000 mg | INTRAMUSCULAR | Status: DC
  Administered 2019-04-28 – 2019-05-01 (×18): 12.5 mg via INTRAVENOUS
  Filled 2019-04-28 (×19): qty 1

## 2019-04-28 MED ORDER — GNP PEPPERMINT SPIRIT SPRT
Status: DC | PRN
  Administered 2019-04-29: 08:00:00

## 2019-04-28 MED ORDER — PROMETHAZINE HCL 25 MG/ML IJ SOLN
12.5000 mg | INTRAMUSCULAR | Status: DC | PRN
  Administered 2019-04-28 (×2): 12.5 mg via INTRAVENOUS
  Filled 2019-04-28 (×2): qty 1

## 2019-04-28 MED ORDER — FENTANYL 12 MCG/HR TD PT72
1.0000 | MEDICATED_PATCH | TRANSDERMAL | Status: DC
  Administered 2019-04-28 – 2019-05-04 (×3): 1 via TRANSDERMAL
  Filled 2019-04-28 (×3): qty 1

## 2019-04-28 MED ORDER — METOPROLOL TARTRATE 5 MG/5ML IV SOLN
5.0000 mg | Freq: Four times a day (QID) | INTRAVENOUS | Status: DC | PRN
  Administered 2019-04-29 (×2): 5 mg via INTRAVENOUS
  Filled 2019-04-28 (×2): qty 5

## 2019-04-28 MED ORDER — METOPROLOL TARTRATE 5 MG/5ML IV SOLN: 5.0000 mg | INTRAVENOUS | Status: DC | PRN

## 2019-04-28 NOTE — Progress Notes (Signed)
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 She continues to have constant generalized abdominal pain, epigastric pain is worse after drinking fluids. She is taking sips of water this am. Persistent coughing this am which result in gagging and spitting up clear phlegm. She passed a formed brown BM yesterday. Passing gast per the rectum this am.    Objective:  Vital signs in last 24 hours: Temp:  [97.9 F (36.6 C)-98.2 F (36.8 C)] 97.9 F (36.6 C) (11/18 0610) Pulse Rate:  [92-127] 127 (11/18 0610) Resp:  [18-20] 20 (11/18 0610) BP: (93-117)/(67-78) 117/68 (11/18 0610) SpO2:  [99 %-100 %] 100 % (11/18 0610) Last BM Date: 04/26/19 General:   Alert ill-appearing 48 year old female, sitting up in the bed coughing which results in gagging episodes. Eyes: Sclera mildly icteric, conjunctiva pink Heart: Tachycardic no murmurs. Pulm: Breath sounds clear throughout, mildly diminished in the bases bilaterally.  No wheezes or rhonchi. Abdomen: Soft, protuberant, generalized tenderness throughout without rebound or guarding, abdomen is not tense, no significant ascites assessed, hypoactive bowel sounds to all 4 quadrants. No HSM.  Extremities: Trace amount of bilateral lower extremity edema. Neurologic:  Alert and  oriented x4;  grossly normal neurologically. Psych:  Alert and cooperative. Normal mood and affect.  Intake/Output from previous day: 11/17 0701 - 11/18 0700 In: 720 [P.O.:720] Out: -  Intake/Output this shift: No intake/output data recorded.  Lab Results: Recent Labs    04/28/19 0458  WBC 9.2  HGB 11.1*  HCT 33.4*  PLT 189   BMET Recent Labs    04/27/19 0553 04/28/19 0458  NA 134* 135  K 3.4* 3.5  CL 99 99  CO2 25 25  GLUCOSE 93 101*  BUN 9 10  CREATININE 0.51 0.44  CALCIUM 8.7* 8.8*   LFT Recent Labs    04/26/19 0447  04/28/19 0458  PROT 5.7*   < >  5.6*  ALBUMIN 3.3*   < > 2.7*  AST 124*   < > 209*  ALT 35   < > 58*  ALKPHOS 365*   < > 500*  BILITOT 5.5*   < > 9.3*  BILIDIR 2.9*  --   --   IBILI 2.6*  --   --    < > = values in this interval not displayed.   PT/INR Recent Labs    04/28/19 0458  LABPROT 16.2*  INR 1.3*   Hepatitis Panel No results for input(s): HEPBSAG, HCVAB, HEPAIGM, HEPBIGM in the last 72 hours.  Dg Chest 2 View  Result Date: 04/26/2019 CLINICAL DATA:  Short of breath.  History of breast cancer. EXAM: CHEST - 2 VIEW COMPARISON:  04/22/2019 FINDINGS: Small left pleural effusion and left lower lobe airspace disease unchanged. Right lower lobe mild airspace disease also unchanged. Mild right upper lobe airspace disease unchanged. Port-A-Cath tip SVC unchanged. IMPRESSION: Bilateral airspace disease unchanged. Small left effusion unchanged. Electronically Signed   By: Franchot Gallo M.D.   On: 04/26/2019 09:04   Mr Abdomen Mrcp Wo Contrast  Result Date: 04/26/2019 CLINICAL DATA:  Inpatient. Metastatic breast cancer to the liver with history of biliary obstruction requiring CBD stent. Patient now with worsening diffuse chest pain and abdominal distention for 2 weeks with ongoing chemotherapy. EXAM: MRI ABDOMEN WITHOUT CONTRAST  (INCLUDING MRCP) TECHNIQUE: Multiplanar multisequence MR imaging of the abdomen was performed. Heavily T2-weighted images of the biliary and pancreatic  ducts were obtained, and three-dimensional MRCP images were rendered by post processing. COMPARISON:  09/18/2018 MRI abdomen.  04/10/2019 CT abdomen/pelvis. FINDINGS: Lower chest: Small to moderate dependent bilateral pleural effusions, increased on the right and similar on the left since 04/10/2019 CT study. Hepatobiliary: Liver is enlarged by numerous (at least 10) similar liver masses scattered throughout the liver, all with signal intensity similar to the spleen (mildly T2 hyperintense and T1 hypointense), compatible with liver metastases.  Representative liver masses as follows: -segment 4b/5 inferior liver 9.5 x 6.8 cm mass (series 9/image 43), previously 9.6 x 6.9 cm on 04/10/2019 CT using similar measurement technique, not substantially changed -segment 4A left liver lobe 8.1 x 6.6 cm mass (series 9/image 30), previously 7.2 x 6.1 cm on 04/10/2019 CT using similar measurement technique, mildly increased -segment 8 right liver dome 1.9 x 1.9 cm mass (series 9/ image 21), previously 1.5 x 1.2 cm, mildly increased -far inferior right liver 2.8 x 2.4 cm mass (series 9/image 50), previously 2.5 x 2.1 cm, mildly increased No hepatic steatosis. Decompressed gallbladder. No definite gallbladder wall thickening. Small amount layering gallbladder sludge with no convincing gallstones. Well-positioned CBD stent in stable position with the distal tip in the duodenal lumen just beyond the ampulla. Mild intrahepatic biliary ductal dilatation throughout segments 2, 3 and 4A of the left liver lobe and minimal intrahepatic biliary ductal dilatation in the inferior central right liver lobe, not substantially changed since 04/10/2019 CT. Common bile duct diameter 10 mm. Gas and small amount of layering fluid noted in CBD stent. Pancreas: No pancreatic mass or duct dilation.  No pancreas divisum. Spleen: Normal size. No mass. Adrenals/Urinary Tract: Normal adrenals. No hydronephrosis. Normal kidneys with no renal mass. Stomach/Bowel: Normal non-distended stomach. Visualized small and large bowel is normal caliber, with no bowel wall thickening. Vascular/Lymphatic: Normal caliber abdominal aorta. No pathologically enlarged lymph nodes in the abdomen. Other: Moderate to large volume abdominal ascites, mildly increased. Musculoskeletal: No aggressive appearing focal osseous lesions. IMPRESSION: 1. Mild progression of hepatic metastatic disease since 04/10/2019 CT as detailed. 2. Stable well-positioned CBD stent. Mild intrahepatic biliary ductal dilatation, most prominent  in the left liver lobe, not substantially changed since 04/10/2019 CT. 3. Moderate to large volume abdominal ascites, mildly increased. 4. Small to moderate dependent bilateral pleural effusions, increased on the right and stable on the left. Electronically Signed   By: Ilona Sorrel M.D.   On: 04/26/2019 10:00   Mr 3d Recon At Scanner  Result Date: 04/26/2019 CLINICAL DATA:  Inpatient. Metastatic breast cancer to the liver with history of biliary obstruction requiring CBD stent. Patient now with worsening diffuse chest pain and abdominal distention for 2 weeks with ongoing chemotherapy. EXAM: MRI ABDOMEN WITHOUT CONTRAST  (INCLUDING MRCP) TECHNIQUE: Multiplanar multisequence MR imaging of the abdomen was performed. Heavily T2-weighted images of the biliary and pancreatic ducts were obtained, and three-dimensional MRCP images were rendered by post processing. COMPARISON:  09/18/2018 MRI abdomen.  04/10/2019 CT abdomen/pelvis. FINDINGS: Lower chest: Small to moderate dependent bilateral pleural effusions, increased on the right and similar on the left since 04/10/2019 CT study. Hepatobiliary: Liver is enlarged by numerous (at least 10) similar liver masses scattered throughout the liver, all with signal intensity similar to the spleen (mildly T2 hyperintense and T1 hypointense), compatible with liver metastases. Representative liver masses as follows: -segment 4b/5 inferior liver 9.5 x 6.8 cm mass (series 9/image 43), previously 9.6 x 6.9 cm on 04/10/2019 CT using similar measurement technique, not substantially changed -segment  4A left liver lobe 8.1 x 6.6 cm mass (series 9/image 30), previously 7.2 x 6.1 cm on 04/10/2019 CT using similar measurement technique, mildly increased -segment 8 right liver dome 1.9 x 1.9 cm mass (series 9/ image 21), previously 1.5 x 1.2 cm, mildly increased -far inferior right liver 2.8 x 2.4 cm mass (series 9/image 50), previously 2.5 x 2.1 cm, mildly increased No hepatic steatosis.  Decompressed gallbladder. No definite gallbladder wall thickening. Small amount layering gallbladder sludge with no convincing gallstones. Well-positioned CBD stent in stable position with the distal tip in the duodenal lumen just beyond the ampulla. Mild intrahepatic biliary ductal dilatation throughout segments 2, 3 and 4A of the left liver lobe and minimal intrahepatic biliary ductal dilatation in the inferior central right liver lobe, not substantially changed since 04/10/2019 CT. Common bile duct diameter 10 mm. Gas and small amount of layering fluid noted in CBD stent. Pancreas: No pancreatic mass or duct dilation.  No pancreas divisum. Spleen: Normal size. No mass. Adrenals/Urinary Tract: Normal adrenals. No hydronephrosis. Normal kidneys with no renal mass. Stomach/Bowel: Normal non-distended stomach. Visualized small and large bowel is normal caliber, with no bowel wall thickening. Vascular/Lymphatic: Normal caliber abdominal aorta. No pathologically enlarged lymph nodes in the abdomen. Other: Moderate to large volume abdominal ascites, mildly increased. Musculoskeletal: No aggressive appearing focal osseous lesions. IMPRESSION: 1. Mild progression of hepatic metastatic disease since 04/10/2019 CT as detailed. 2. Stable well-positioned CBD stent. Mild intrahepatic biliary ductal dilatation, most prominent in the left liver lobe, not substantially changed since 04/10/2019 CT. 3. Moderate to large volume abdominal ascites, mildly increased. 4. Small to moderate dependent bilateral pleural effusions, increased on the right and stable on the left. Electronically Signed   By: Ilona Sorrel M.D.   On: 04/26/2019 10:00   US Paracentesis  Result Date: 04/27/2019 INDICATION: Breast cancer, ascites EXAM: ULTRASOUND GUIDED DIAGNOSTIC AND THERAPEUTIC PARACENTESIS MEDICATIONS: None COMPLICATIONS: None immediate PROCEDURE: Informed written consent was obtained from the patient after a discussion of the risks,  benefits and alternatives to treatment. A timeout was performed prior to the initiation of the procedure. Initial ultrasound scanning demonstrates a large amount of ascites within the right lower abdominal quadrant. The right lower abdomen was prepped and draped in the usual sterile fashion. 1% lidocaine was used for local anesthesia. Following this, a 5 [place] Yueh catheter was introduced. An ultrasound image was saved for documentation purposes. The paracentesis was performed. The catheter was removed and a dressing was applied. The patient tolerated the procedure well without immediate post procedural complication. Patient received post-procedure intravenous albumin; see nursing notes for details. FINDINGS: A total of approximately 3.4 L of yellow ascitic fluid was removed. Samples were sent to the laboratory as requested by the clinical team. IMPRESSION: Successful ultrasound-guided paracentesis yielding 3.4 liters of peritoneal fluid. Electronically Signed   By: Lavonia Dana M.D.   On: 04/27/2019 15:21    Assessment / Plan:  60.  48 year old female with a history of breast cancer with hepatic metastasis and biliary obstruction, elevated LFTs. S/P ERCP 09/22/2018 with placement of a biliary stent which was removed 03/11/2019 and a fully covered wall stent was placed. She developed nausea, vomiting, epigastric pain and SOB. She was admitted to [facility] on 11/9. LFTs and T. Bili levels were elevated. A MRCP 04/26/2019 showed mild progression of hepatic metastatic disease, the proximal end of the Wallstent was occluded by tumor (as interpreted by Dr. Laural Golden), mild intrahepatic biliary ductal dilatation and moderate to large volume  abdominal ascites.  Plan for ERCP when her respiratory status is stable. Alk phos 500 >>462.  AST 209 >>192.  ALT 58 >>50.  Total bili 9.3 >>7.7.  WBC 9.2.  INR 1.3.  She is afebrile.  At risk for cholangitis. -Hepatic panel and CBC in a.m. -Continue Reglan 10 mg IV 3 times  daily -Continue Pantoprazole 40 mg IV every 12 hours -Respiratory status is not stable at this time to proceed with an ERCP today.  To re-evaluate the patient tomorrow. -? Should patient be on  Zosyn  2.  Ascites is likely due to breast cancer with hepatic metastasis versus cirrhosis. She underwent a paracentesis 11/17, 3.4 L of ascitic fluid was removed. Labs were obtained but results not yet in Epic. -Next paracentesis to include albumin and protein level to assess SAAG level, as well as cell count with differential, Gram stain, aerobic and anaerobic culture and cytology -Further recommendations per Dr. Dorien Chihuahua  3.  Bilateral pleural effusions status post left thoracentesis 04/16/2019, cytology is positive for malignant cells. Right upper lobe pneumonia.  She was given Levaquin IV.  4.  Gallbladder wall thickening, unlikely acute cholecystitis  5.  Normocytic anemia.  Hemoglobin 11.1.  Hematocrit 33.4.  MCV 80.1.  Platelet 189.  Signs of active GI bleeding. -Repeat CBC in a.m.  6.  Hypoalbuminemia.  Albumin 2.7.     Principal Problem:   Pneumonia Active Problems:   Asthma   Depression   Metastatic breast cancer ([place])   Pleural effusion   Respiratory distress   Ascites   Malignant pleural effusion     LOS: 9 days   Noralyn Pick  04/28/2019, 7:36 AM

## 2019-04-28 NOTE — Progress Notes (Signed)
Palliative:   HPI: 48 y.o. female  with past medical history of metastatic breast cancer, asthma, depression, anxietyadmitted on 04/19/2019 with SOB, cough d/t community acquired pneumonia. S/P left thoracentesis 04/21/19 cytology positive for malignant cells. Chemotherapy on hold s/t elevated LFTs last given 02/25/19 but continued progression on therapy. Now with worsening ascites and GI consulting for likely ERCP and potential stenting since bilirubin continues to increase. S/P paracentesis 04/27/19 3.4L, 04/23/19 4.7L and 04/16/19 4.9L.   I met again today with Becca. She is sitting in bed and is having horrible nausea as well as pain (although the nausea is worse than the pain at current time). We discussed plan to better manage her pain and nausea (outlined below) and she agrees with plan. She has reviewed the papers that I left for her yesterday but has not felt up to completing. She also requests shower so I have placed order allowing for d/c tele and able to shower with assistance at her request.   She feels so terribly that we are not able to speak too much about her [place]. Right now her goals are to better control her symptoms. Plans for potential ERCP and stenting tomorrow. We did discuss code status and she continues to consider her options. She wants to live as long as possible for her daughter but also does not want to suffer. We discussed that her body is likely to weak to be able to recover from resuscitation or even ventilation. I worry about her daughter having to make decisions to remove her from vent and she does not want this either. We discussed alternatives of continued care and even BiPAP as a bridge but not escalating to vent/resuscitation. She will continue to consider her options. I also offered to speak with her daughter if she would like this and Selena Batten will ask her daughter if she wants to speak with me. I can meet with her in person or via telephone if she wishes.   All  questions/concerns addressed. Emotional support provided.   Exam: Alert, oriented. Pale. Miserable and appears uncomfortable. Breathing regular, unlabored. Abd distended.   Plan: - Pain:  - Adding Fentanyl patch 12 mcg/hr. May take up to 24 hours to reach full efficacy.   - Continue morphine 2 mg every hour prn.   - Oxy-acetaminophen 5-325 mg 1 tablet every 6 hours prn moderate pain.   - Continue gabapentin.  - Nausea:  - Phenergan 12.5 mg every 4 hours SCHEDULED for now. Phenergan is only relief of nausea and has not been lasting her even 4 hours so far. I am hoping that once pain improves maybe her nausea will improve. Will continue to reassess necessity of this dosage and amount of Phenergan.   - Robinul prn.   - Peppermint oil to bedside.   [place], NP Palliative Medicine Team Pager [phone] (Please see [email or web address] for schedule) Team Phone [phone]    Greater than 50%  of this time was spent counseling and coordinating care related to the above assessment and plan

## 2019-04-28 NOTE — TOC Progression Note (Signed)
Transition of Care [facility]) - Progression Note    Patient Details  Name: Pamela Long MRN: [id number] Date of Birth: 02/27/1971  Transition of Care [facility] CM/SW Contact  Shade Flood, LCSW Phone Number: 04/28/2019, 10:37 AM  Clinical Narrative:     TOC following with daily chart review and discussion with MD in Progression. Palliative APNP following for support and goals of care discussions. Per MD, pt to have ERCP tomorrow. DC timeframe not yet known.  Will follow and assist with any referrals as needed.  Expected Discharge Plan: [place] Barriers to Discharge: Continued Medical Work up  Expected Discharge Plan and Services Expected Discharge Plan: [place] In-house Referral: Clinical Social Work     Living arrangements for the past 2 months: Single Family Home                                       Social Determinants of Health (SDOH) Interventions    Readmission Risk Interventions Readmission Risk Prevention Plan 04/28/2019 04/20/2019 02/01/2019  Transportation Screening - Complete Complete  PCP or Specialist Appt within 3-5 Days - - -  HRI or [place] Work Consult for [place] - - -  Medication Review Press photographer) - Complete Complete  PCP or Specialist appointment within 3-5 days of discharge - - Not Complete  HRI or [place] - - Complete  HRI or Home Care Consult Pt Refusal Comments - - -  SW Recovery Care/Counseling Consult - - Complete  Palliative Care Screening Complete - Not Complete  Skilled Nursing Facility - Not Applicable Not Complete

## 2019-04-28 NOTE — Progress Notes (Signed)
PROGRESS NOTE    Pamela Long  DJS:[id number] DOB: [birth year] DOA: 04/19/2019 PCP: Doree Albee, MD    Brief Narrative:  48 year old female who presented with cough and dyspnea.  She does have significant past medical history for metastatic breast cancer, depression and asthma.  She reported worsening productive cough, associated with difficulty breathing for about a week.  Positive pleuritic chest pain.  On her initial physical examination her heart rate was 119, oxygen saturation 89% on room air, blood pressure 131/86, respiratory rate 19.  Her lungs were clear to auscultation bilaterally, heart S1-S2 present, tachycardic, abdomen soft, trace lower extremity edema more left than right SARS COVID-19 negative.  Chest radiograph with faint infiltrate right upper lobe, atelectasis left lower lobe with small pleural effusion.   Patient was admitted to the hospital with the working diagnosis of acute hypoxic respiratory failure due to community-acquired pneumonia, present on admission.  She has been responding well to antibiotic therapy with levofloxacin. Underwent left pleural effusion thoracentesis with 980 fluid obtained.  -11/11 left thoracentesis with removal of 980 mL of fluid--fluid cytology from 04/21/2019 with malignant cells 11/17 paracentesis with removal of 3.4 L of fluid-fluid cytology from 04/27/2019 with - Malignant cells consistent with metastatic adenocarcinoma   Assessment & Plan:   Principal Problem:   Pneumonia Active Problems:   Asthma   Depression   Metastatic breast cancer ([place])   Pleural effusion   Respiratory distress   Ascites   Malignant pleural effusion   1. Acute hypoxic respiratory failure due to right upper lobe community acquired pneumonia, complicated with left plural effusion. Patient complained of dyspnea and pleuritic chest pain, oximetry is 97% on 2 LPM per [place]. US guided thoracentesis to the left with 980 ml of yellow fluid.  -Completed  Levaquin -11/11 left thoracentesis with removal of 980 mL of fluid--fluid cytology from 04/21/2019 with malignant cells 11/17 paracentesis with removal of 3.4 L of fluid-fluid cytology from 04/27/2019 with - Malignant cells consistent with metastatic adenocarcinoma  - right-sided pleural effusion was evaluated and was not felt to be significant enough to attempt thoracentesis.  It was noted that she has significant ascites and she has undergone multiple paracentesis which had improved her shortness of breath.  Pleural fluid cytology was positive for malignant cells.  2. Metastatic breast cancer/ liver metastais/elevated LFTs. On chemotherapy as outpatient, will follow as outpatient.  LFTs were elevated on admission.  She recently underwent stent placement by Dr. Laural Golden in 03/2019.  GI consulted, appreciate input.  Started on Reglan for nausea and vomiting.  MRCP showed the biliary stent may be partially obstructed by liver mets.  It does not appear to be completely obstructed.  LFTs currently trending up.  Dr. Laural Golden has discussed with the patient we will plan on ERCP on 11/19 for stent exchange.  Oncology is also following and will help develop further plan after procedure.   -AST 209 (Peak), ALT 58 (peak), T bili 9.3 (peak), alk phos 500 (PEAK), INR 1.3  3. Hypokalemia and hypomagnesemia.  Replaced. Renal function stable with serum cr at 0.5.   4. HTN.  Blood pressure stable on metoprolol, may use IV metoprolol as needed elevated HR  5. Asthma. Stable with no signs of exacerbation.   6.  Ascites.  Likely related to underlying liver disease/hypoalbuminemia.  Requiring frequent paracentesis  -??  If candidate for Pleurx catheter placement  -Recurrent ascites may also be related to underlying malignant metastatic disease.  Patient has undergone paracentesis x3 during  her hospital stay.   --Ascitic fluid from 04/27/2019 with malignant cells consistent with metastatic adenocarcinoma    7)Social/Ethics--- palliative care consult appreciated, patient remains a full code -Fentanyl patch started 04/28/2019 -Overall prognosis is poor/grave   DVT prophylaxis: enoxaparin   Code Status:  full Family Communication: no family at the bedside  Disposition Plan/ discharge barriers:  Pending clinical improvement.   Body mass index is 27.12 kg/m. Malnutrition Type:  Nutrition Problem: Severe Malnutrition Etiology: cancer and cancer related treatments(breast cancer with mets to liver; last chemotherapy 10/15)   Malnutrition Characteristics:  Signs/Symptoms: percent weight loss, moderate fat depletion, severe fat depletion, moderate muscle depletion, severe muscle depletion, edema, energy intake < or equal to 75% for > or equal to 1 month, per patient/family report Percent weight loss: 12.6 %(23.5 lbs x 3 months)   Nutrition Interventions:  Interventions: Boost Breeze, Education  RN Pressure Injury Documentation:    Consultants:   Oncology  Interventional radiology   Gastroenterology  Palliative care  Procedures:   11/6 paracentesis with removal of 4.9 L of fluid  11/11 left thoracentesis with removal of 980 mL of fluid--fluid cytology from 04/21/2019 with malignant cells  11/13 paracentesis with removal of 4.7 L of fluid  11/17 paracentesis with removal of 3.4 L of fluid-fluid cytology from 04/27/2019 with - Malignant cells consistent with metastatic adenocarcinoma   Antimicrobials:   Levofloxacin 11/10 > 11/14   Subjective: -Nausea and emesis persist, abdominal pain persist, No fever  Or chills   Objective: Vitals:   04/27/19 1409 04/27/19 2245 04/28/19 0610 04/28/19 1354  BP: 98/67 107/70 117/68 (!) 129/54  Pulse: 95 (!) 108 (!) 127 (!) 103  Resp: 18 20 20 20  Temp:  98.2 F (36.8 C) 97.9 F (36.6 C) 98.6 F (37 C)  TempSrc:  Oral Oral Oral  SpO2: 99% 100% 100% 96%  Weight:      Height:        Intake/Output Summary (Last 24 hours) at  04/28/2019 1511 Last data filed at 04/27/2019 1700 Gross per 24 hour  Intake 240 ml  Output -  Net 240 ml   Filed Weights   04/19/19 1521  Weight: 73.9 kg    Examination:   General exam: Alert, awake, oriented x 3 Respiratory system: Diminished breath sounds at bases, no wheezing cardiovascular system:RRR. No murmurs, rubs, gallops. Gastrointestinal system: Abdomen is distended, soft and mild epigastric and right upper quadrant discomfort on palpation without rebound or guarding. +ve BS Central nervous system: Alert and oriented. No focal neurological deficits. Extremities: 1+ edema bilaterally Skin: No rashes, lesions or ulcers Psychiatry: Judgement and insight appear normal. Mood & affect is flat  Data Reviewed:  CBC: Recent Labs  Lab 04/22/19 0503 04/23/19 0420 04/28/19 0458  WBC 9.5 10.1 9.2  NEUTROABS 7.6  --   --   HGB 11.1* 11.6* 11.1*  HCT 34.5* 36.1 33.4*  MCV 81.6 80.2 80.1  PLT 212 217 189   Basic Metabolic Panel: Recent Labs  Lab 04/22/19 0503 04/23/19 0420 04/24/19 0650 04/27/19 0553 04/28/19 0458  NA 135 134* 134* 134* 135  K 4.0 3.8 3.7 3.4* 3.5  CL 100 99 98 99 99  CO2 25 25 26 25 25  GLUCOSE 101* 105* 78 93 101*  BUN 10 9 10 9 10  CREATININE 0.57 0.59 0.47 0.51 0.44  CALCIUM 8.5* 8.5* 8.5* 8.7* 8.8*  MG 1.9  --   --   --   --    GFR: Estimated   Creatinine Clearance: 84.7 mL/min (by C-G formula based on SCr of 0.44 mg/dL). Liver Function Tests: Recent Labs  Lab 04/24/19 0650 04/25/19 0631 04/26/19 0447 04/27/19 0553 04/28/19 0458  AST 152* 168* 124* 192* 209*  ALT 41 47* 35 50* 58*  ALKPHOS 458* 511* 365* 462* 500*  BILITOT 4.0* 4.3* 5.5* 7.7* 9.3*  PROT 5.4* 5.4* 5.7* 5.7* 5.6*  ALBUMIN 1.9* 1.9* 3.3* 3.0* 2.7*   No results for input(s): LIPASE, AMYLASE in the last 168 hours. No results for input(s): AMMONIA in the last 168 hours. Coagulation Profile: Recent Labs  Lab 04/28/19 0458  INR 1.3*   Cardiac Enzymes: No results  for input(s): CKTOTAL, CKMB, CKMBINDEX, TROPONINI in the last 168 hours. BNP (last 3 results) No results for input(s): PROBNP in the last 8760 hours. HbA1C: No results for input(s): HGBA1C in the last 72 hours. CBG: No results for input(s): GLUCAP in the last 168 hours. Lipid Profile: No results for input(s): CHOL, HDL, LDLCALC, TRIG, CHOLHDL, LDLDIRECT in the last 72 hours. Thyroid Function Tests: No results for input(s): TSH, T4TOTAL, FREET4, T3FREE, THYROIDAB in the last 72 hours. Anemia Panel: No results for input(s): VITAMINB12, FOLATE, FERRITIN, TIBC, IRON, RETICCTPCT in the last 72 hours.   Radiology Studies:  Scheduled Meds: . Chlorhexidine Gluconate Cloth  6 each Topical Daily  . feeding supplement  1 Container Oral TID BM  . fentaNYL  1 patch Transdermal Q72H  . gabapentin  300 mg Oral QHS  . lubiprostone  8 mcg Oral BID WC  . metoCLOPramide (REGLAN) injection  10 mg Intravenous TID AC  . metoprolol tartrate  12.5 mg Oral BID  . pantoprazole (PROTONIX) IV  40 mg Intravenous Q12H  . promethazine  12.5 mg Intravenous Q4H   Continuous Infusions:    LOS: 9 days   Roxan Hockey, MD

## 2019-04-28 NOTE — Progress Notes (Signed)
Nutrition Follow-up  DOCUMENTATION CODES:   Severe malnutrition in context of chronic illness  INTERVENTION:  -Continue Boost Breeze po TID, each supplement provides 250 kcal and 9 grams of protein -Encourage po intake -Advance diet as medically feasible  -Recommend obtaining new wt to fully assess needs  NUTRITION DIAGNOSIS:  Severe Malnutrition related to cancer and cancer related treatments(breast cancer with mets to liver; last chemotherapy 10/15) as evidenced by percent weight loss, moderate fat depletion, severe fat depletion, moderate muscle depletion, severe muscle depletion, edema, energy intake < or equal to 75% for > or equal to 1 month, per patient/family report. Ongoing  GOAL:   Patient will meet greater than or equal to 90% of their needs Progressing, pt provided ONS  MONITOR:   PO intake, Supplement acceptance, I & O's, Labs, Weight trends  REASON FOR ASSESSMENT:   Malnutrition Screening Tool    ASSESSMENT:  48 year old female with past medical history significant for metastatic breast cancer s/p bilateral mastectomy, depression, asthma, GERD, recent hospitalization 10/31-11/1 for n/v with weakness thought secondary to chemo treatment who presented to ED with complaints of worsening cough productive of whitish sputum and difficulty breathing over the past week. She reports persistent pleuritic chest pain from recent hospitalization, unchanged lower extremity swelling and poor po intake with nausea and occasional vomiting. Portable CXR showed suspected ongoing bilateral pleural effusion  11/17 paracentesis - 3.4 L removed 11/11 paracentesis - 980 ml removed 11/6 paracentesis  - 4.9 L removed 10/15 Paclitaxel (Day 1;Cycle 5) - breast cancer; mets to liver  Patient sitting up in bed with hands covering her forehead this afternoon at RD visit. Patient mumbling and difficult to understand today. She reports that she feels awful and unable to eat/drink anything; noted  5-6 unopened small bottles of water and Boost Breeze on bedside tray. Patient started on Reglan for nausea/vomiting. Per chart review, MRCP showed possible partial biliary stent obstruction secondary to liver mets; plans for ERCP 11/19 for stent exchange. RD will continue to monitor for diet advancement and po intake s/p procedure.   Patient consumed 25-100% (63% avg) of FL meals from 11/11-11/14.   UBW 175-180 lbs (per 11/10 RD note) No new weights recorded since admission, recommend obtained current wt to fully assess needs.  Diet Order:   Diet Order            Diet NPO time specified  Diet effective midnight        Diet clear liquid Room service appropriate? Yes; Fluid consistency: Thin  Diet effective now              EDUCATION NEEDS:   Education needs have been addressed  Skin:  Skin Assessment: Reviewed RN Assessment(incision;closed left;abdomen)  Last BM:  Unknown  Height:   Ht Readings from Last 1 Encounters:  04/19/19 5\' 5"  (1.651 m)    Weight:   Wt Readings from Last 1 Encounters:  04/19/19 73.9 kg    Ideal Body Weight:  56.8 kg  BMI:  Body mass index is 27.12 kg/m.  Estimated Nutritional Needs:   Kcal:  2215-2365  Protein:  111-119  Fluid:  >/= 2.2 L/day   Lajuan Lines, RD, LDN Clinical Nutrition Office [phone] After Hours/Weekend Pager: [phone]

## 2019-04-29 ENCOUNTER — Inpatient Hospital Stay (HOSPITAL_COMMUNITY): Payer: 59

## 2019-04-29 ENCOUNTER — Encounter (HOSPITAL_COMMUNITY): Payer: Self-pay | Admitting: *Deleted

## 2019-04-29 ENCOUNTER — Inpatient Hospital Stay (HOSPITAL_COMMUNITY): Payer: 59 | Admitting: Anesthesiology

## 2019-04-29 ENCOUNTER — Encounter (HOSPITAL_COMMUNITY)
Admission: EM | Disposition: A | Discharge status: Hospice | Payer: Self-pay | Source: Home / Self Care | Attending: Family Medicine

## 2019-04-29 DIAGNOSIS — K3189 Other diseases of stomach and duodenum: Secondary | ICD-10-CM

## 2019-04-29 DIAGNOSIS — R17 Unspecified jaundice: Secondary | ICD-10-CM

## 2019-04-29 DIAGNOSIS — K228 Other specified diseases of esophagus: Secondary | ICD-10-CM

## 2019-04-29 DIAGNOSIS — K766 Portal hypertension: Secondary | ICD-10-CM

## 2019-04-29 DIAGNOSIS — K831 Obstruction of bile duct: Secondary | ICD-10-CM

## 2019-04-29 HISTORY — PX: ERCP: SHX5425

## 2019-04-29 HISTORY — PX: BILIARY STENT PLACEMENT: SHX5538

## 2019-04-29 HISTORY — PX: ESOPHAGOGASTRODUODENOSCOPY (EGD) WITH PROPOFOL: SHX5813

## 2019-04-29 LAB — CBC WITH DIFFERENTIAL/PLATELET
Abs Immature Granulocytes: 0.07 10*3/uL (ref 0.00–0.07)
Basophils Absolute: 0 10*3/uL (ref 0.0–0.1)
Basophils Relative: 0 %
Eosinophils Absolute: 0 10*3/uL (ref 0.0–0.5)
Eosinophils Relative: 0 %
HCT: 35.7 % — ABNORMAL LOW (ref 36.0–46.0)
Hemoglobin: 12 g/dL (ref 12.0–15.0)
Immature Granulocytes: 1 %
Lymphocytes Relative: 4 %
Lymphs Abs: 0.6 10*3/uL — ABNORMAL LOW (ref 0.7–4.0)
MCH: 26.7 pg (ref 26.0–34.0)
MCHC: 33.6 g/dL (ref 30.0–36.0)
MCV: 79.3 fL — ABNORMAL LOW (ref 80.0–100.0)
Monocytes Absolute: 1 10*3/uL (ref 0.1–1.0)
Monocytes Relative: 8 %
Neutro Abs: 10.8 10*3/uL — ABNORMAL HIGH (ref 1.7–7.7)
Neutrophils Relative %: 87 %
Platelets: 264 10*3/uL (ref 150–400)
RBC: 4.5 MIL/uL (ref 3.87–5.11)
RDW: 24.8 % — ABNORMAL HIGH (ref 11.5–15.5)
WBC: 12.5 10*3/uL — ABNORMAL HIGH (ref 4.0–10.5)
nRBC: 0 % (ref 0.0–0.2)

## 2019-04-29 LAB — COMPREHENSIVE METABOLIC PANEL
ALT: 77 U/L — ABNORMAL HIGH (ref 0–44)
AST: 286 U/L — ABNORMAL HIGH (ref 15–41)
Albumin: 2.6 g/dL — ABNORMAL LOW (ref 3.5–5.0)
Alkaline Phosphatase: 629 U/L — ABNORMAL HIGH (ref 38–126)
Anion gap: 12 (ref 5–15)
BUN: 13 mg/dL (ref 6–20)
CO2: 23 mmol/L (ref 22–32)
Calcium: 8.8 mg/dL — ABNORMAL LOW (ref 8.9–10.3)
Chloride: 101 mmol/L (ref 98–111)
Creatinine, Ser: 0.46 mg/dL (ref 0.44–1.00)
GFR calc Af Amer: >60 mL/min (ref >60)
GFR calc non Af Amer: >60 mL/min (ref >60)
Glucose, Bld: 93 mg/dL (ref 70–99)
Potassium: 3.8 mmol/L (ref 3.5–5.1)
Sodium: 136 mmol/L (ref 135–145)
Total Bilirubin: 12.4 mg/dL — ABNORMAL HIGH (ref 0.3–1.2)
Total Protein: 6.1 g/dL — ABNORMAL LOW (ref 6.5–8.1)

## 2019-04-29 LAB — BILIRUBIN, DIRECT: Bilirubin, Direct: 7.2 mg/dL — ABNORMAL HIGH (ref 0.0–0.2)

## 2019-04-29 IMAGING — RF DG ERCP WO/W SPHINCTEROTOMY
1 series · 15 of 24 positions shown · non-contrast
Comparison: MR [DATE]

CLINICAL DATA: 48-year-old female with a history of jaundice and
biliary obstruction

EXAM:
ERCP
TECHNIQUE: Multiple spot images obtained with the fluoroscopic device and
submitted for interpretation post-procedure.
FLUOROSCOPY TIME:  Fluoroscopy Time: Please refer to the dictated
operative report for full details of intraoperative findings and
procedure.

[Series 1: run · 40 acquisitions, 15 frames shown]
[im 1/40]
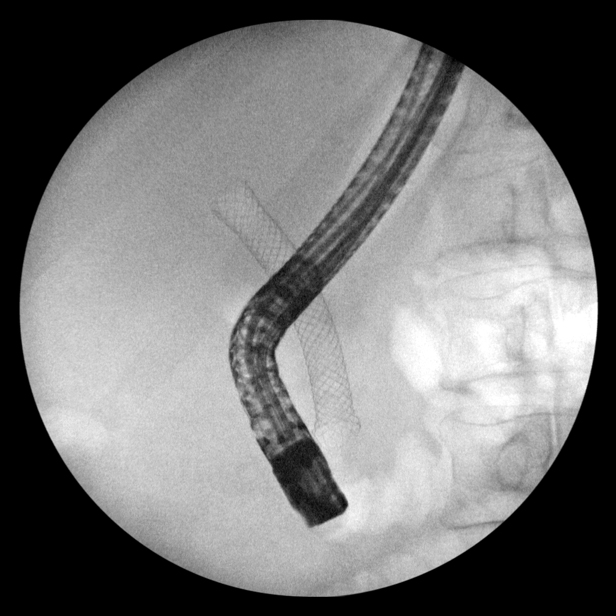
[im 4/40]
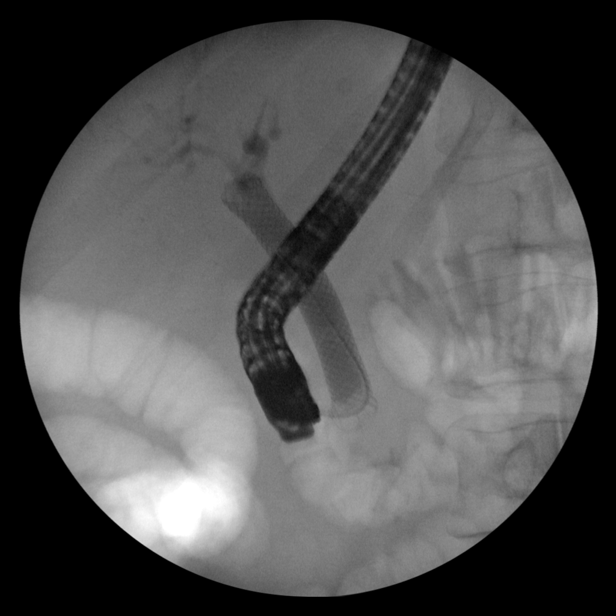
[im 7/40]
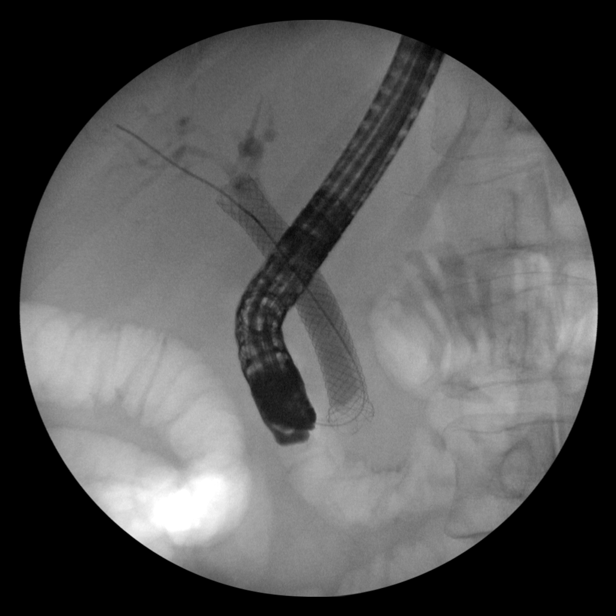
[im 9/40]
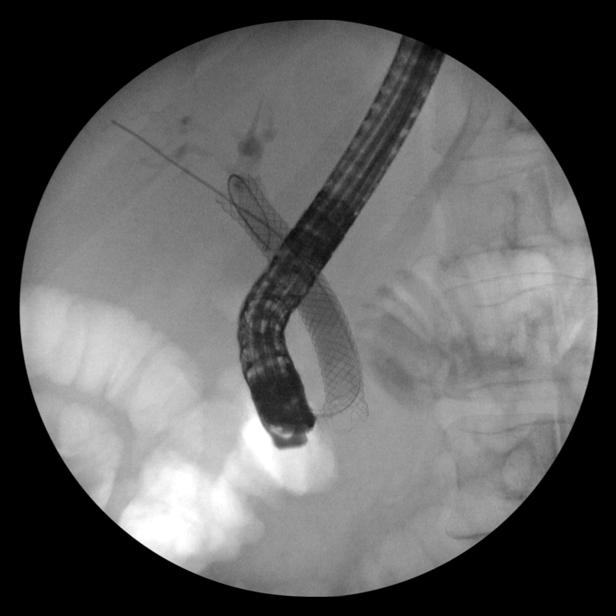
[im 12/40]
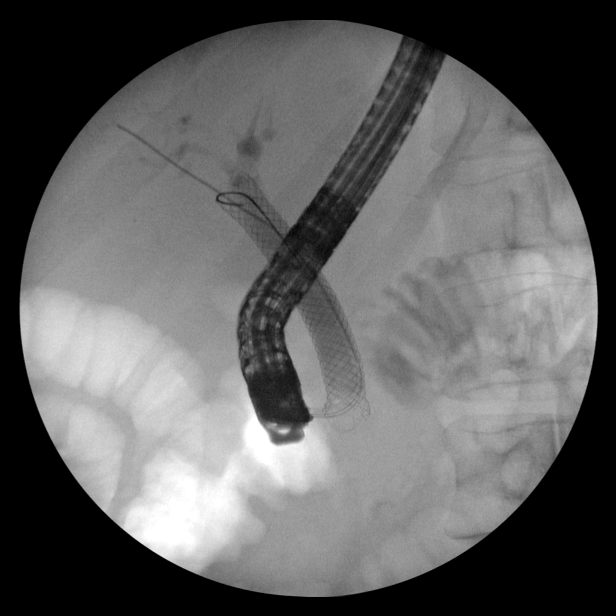
[im 14/40]
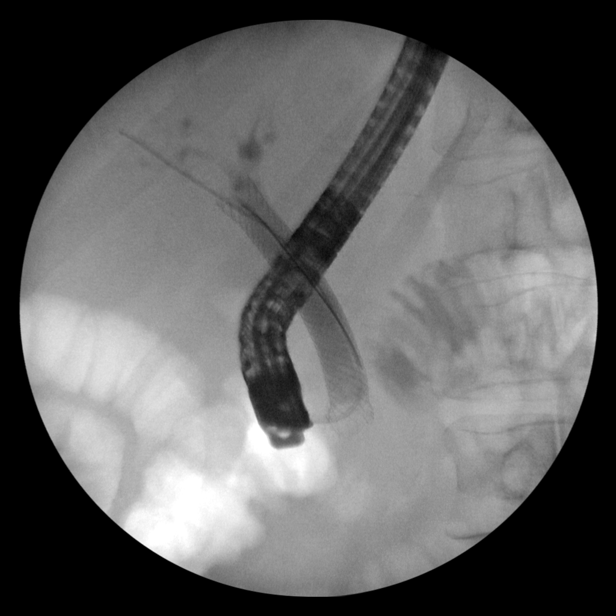
[im 17/40]
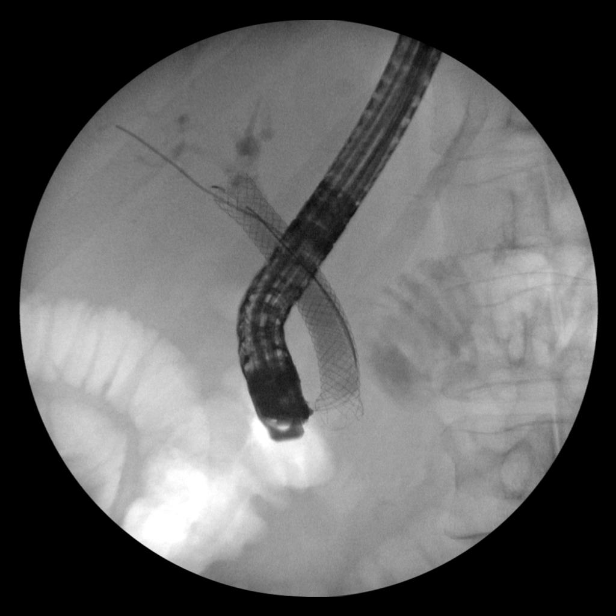
[im 21/40]
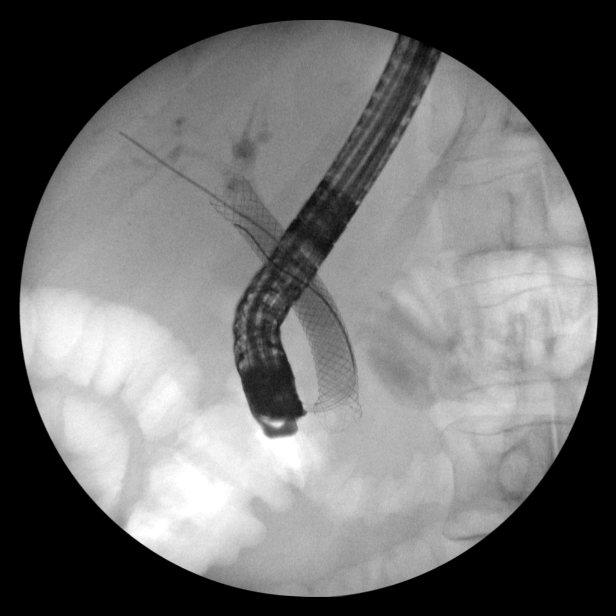
[im 21/40]
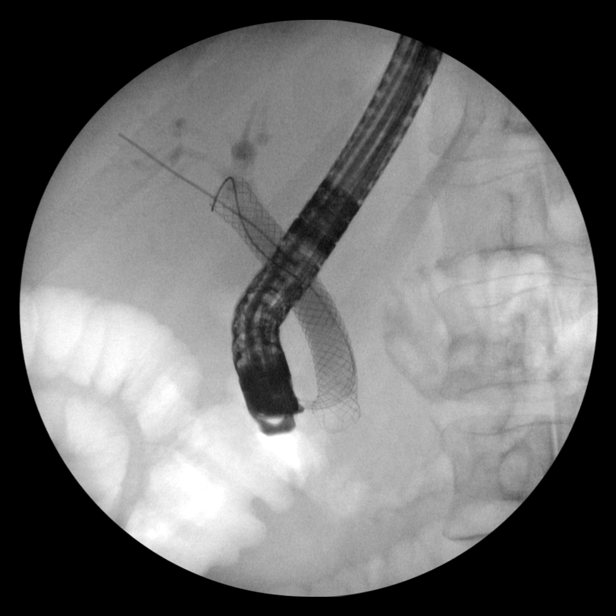
[im 24/40]
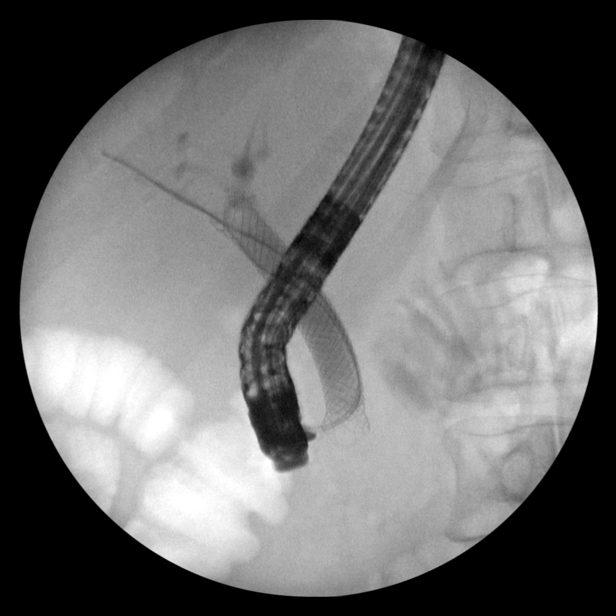
[im 26/40]
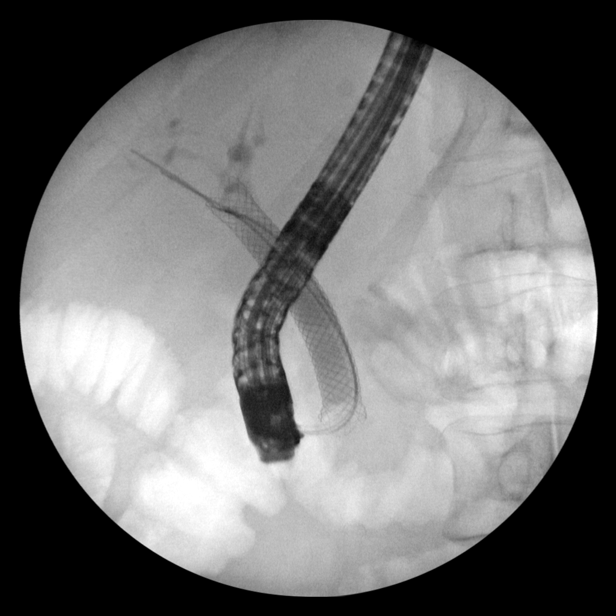
[im 29/40]
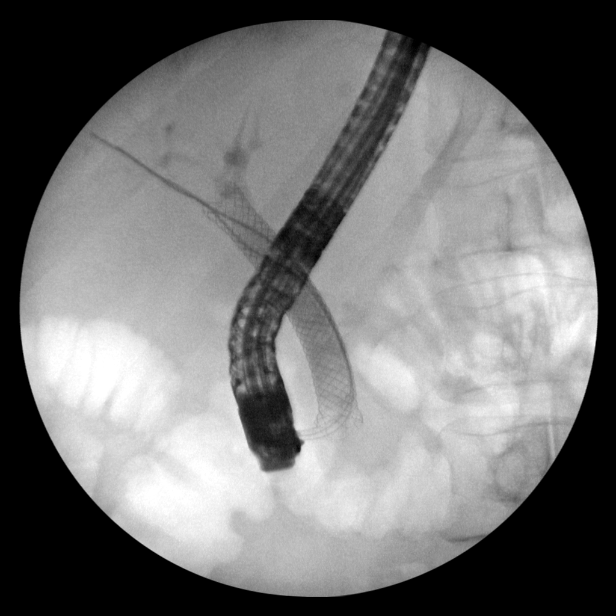
[im 33/40]
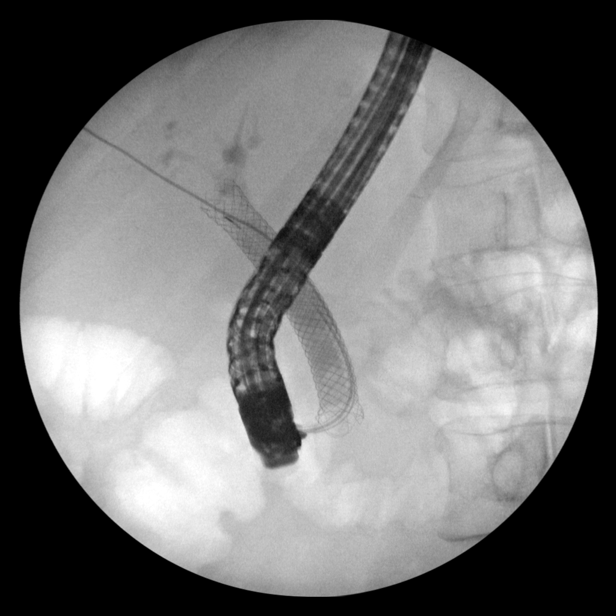
[im 34/40]
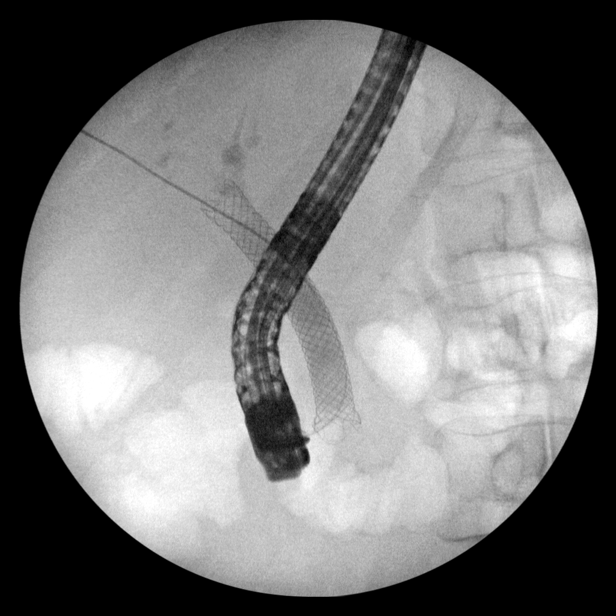
[im 40/40]
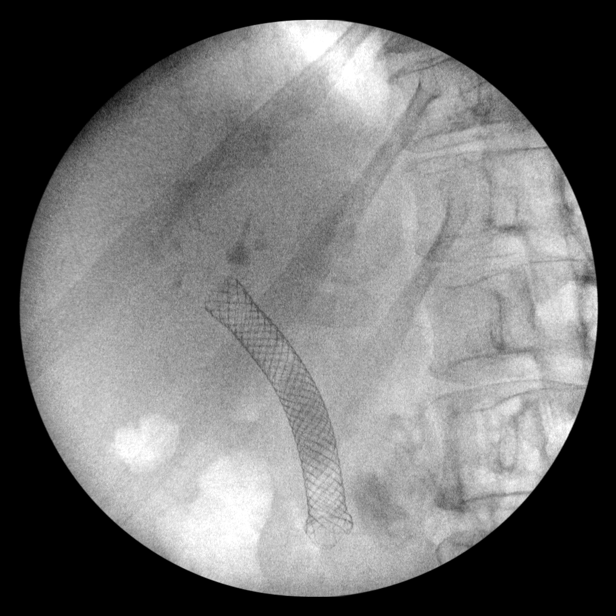

[15 of 24 positions shown; findings below may reference images not displayed]

FINDINGS: Limited images during ERCP.

Initial image demonstrates endoscope projecting over the upper
abdomen. Metallic biliary stent in place.

There is then safety wire cannulation with partial opacification
with retrograde contrast. Incomplete opacification of the
intrahepatic biliary ducts.
IMPRESSION: Limited images during ERCP, as above. Please refer to the dictated
operative report for full details of intraoperative findings and
procedure.

## 2019-04-29 SURGERY — ERCP, WITH INTERVENTION IF INDICATED
Anesthesia: General | Site: Esophagus

## 2019-04-29 MED ORDER — LIDOCAINE 2% (20 MG/ML) 5 ML SYRINGE
INTRAMUSCULAR | Status: AC
  Filled 2019-04-29: qty 5

## 2019-04-29 MED ORDER — SODIUM CHLORIDE 0.9 % IV SOLN
INTRAVENOUS | Status: DC | PRN
  Administered 2019-04-29: 10 mL

## 2019-04-29 MED ORDER — FENTANYL CITRATE (PF) 100 MCG/2ML IJ SOLN
INTRAMUSCULAR | Status: DC | PRN
  Administered 2019-04-29 (×4): 50 ug via INTRAVENOUS

## 2019-04-29 MED ORDER — ARTIFICIAL TEARS OPHTHALMIC OINT
TOPICAL_OINTMENT | OPHTHALMIC | Status: AC
  Filled 2019-04-29: qty 7

## 2019-04-29 MED ORDER — ESMOLOL HCL 100 MG/10ML IV SOLN
INTRAVENOUS | Status: DC | PRN
  Administered 2019-04-29 (×7): 10 mg via INTRAVENOUS

## 2019-04-29 MED ORDER — ROCURONIUM BROMIDE 10 MG/ML (PF) SYRINGE
PREFILLED_SYRINGE | INTRAVENOUS | Status: AC
  Filled 2019-04-29: qty 10

## 2019-04-29 MED ORDER — SODIUM CHLORIDE 0.9 % IV SOLN
INTRAVENOUS | Status: AC
  Filled 2019-04-29: qty 100

## 2019-04-29 MED ORDER — DEXAMETHASONE SODIUM PHOSPHATE 10 MG/ML IJ SOLN
8.0000 mg | Freq: Once | INTRAMUSCULAR | Status: AC
  Administered 2019-04-29: 8 mg via INTRAVENOUS
  Filled 2019-04-29: qty 1

## 2019-04-29 MED ORDER — ONDANSETRON HCL 4 MG/2ML IJ SOLN
INTRAMUSCULAR | Status: DC | PRN
  Administered 2019-04-29: 4 mg via INTRAVENOUS

## 2019-04-29 MED ORDER — ROCURONIUM BROMIDE 100 MG/10ML IV SOLN
INTRAVENOUS | Status: DC | PRN
  Administered 2019-04-29: 20 mg via INTRAVENOUS

## 2019-04-29 MED ORDER — ESMOLOL HCL 100 MG/10ML IV SOLN
INTRAVENOUS | Status: AC
  Filled 2019-04-29: qty 10

## 2019-04-29 MED ORDER — SODIUM CHLORIDE 0.9 % IV SOLN: INTRAVENOUS | Status: DC

## 2019-04-29 MED ORDER — DEXAMETHASONE 4 MG PO TABS
2.0000 mg | ORAL_TABLET | Freq: Every day | ORAL | Status: DC
  Administered 2019-04-30 – 2019-05-02 (×3): 2 mg via ORAL
  Filled 2019-04-29 (×3): qty 1

## 2019-04-29 MED ORDER — GLUCAGON HCL RDNA (DIAGNOSTIC) 1 MG IJ SOLR
INTRAMUSCULAR | Status: DC | PRN
  Administered 2019-04-29: 0.25 mg via INTRAVENOUS

## 2019-04-29 MED ORDER — PROPOFOL 10 MG/ML IV BOLUS
INTRAVENOUS | Status: DC | PRN
  Administered 2019-04-29: 150 mg via INTRAVENOUS

## 2019-04-29 MED ORDER — LACTATED RINGERS IV SOLN
INTRAVENOUS | Status: DC | PRN
  Administered 2019-04-29: 13:00:00 via INTRAVENOUS

## 2019-04-29 MED ORDER — LEVOFLOXACIN IN D5W 500 MG/100ML IV SOLN
500.0000 mg | INTRAVENOUS | Status: AC
  Administered 2019-04-29 – 2019-05-01 (×3): 500 mg via INTRAVENOUS
  Filled 2019-04-29 (×3): qty 100

## 2019-04-29 MED ORDER — PHENYLEPHRINE HCL (PRESSORS) 10 MG/ML IV SOLN
INTRAVENOUS | Status: DC | PRN
  Administered 2019-04-29: 80 ug via INTRAVENOUS

## 2019-04-29 MED ORDER — MEPERIDINE HCL 50 MG/ML IJ SOLN: 6.2500 mg | INTRAMUSCULAR | Status: DC | PRN

## 2019-04-29 MED ORDER — ONDANSETRON HCL 4 MG/2ML IJ SOLN: 4.0000 mg | Freq: Once | INTRAMUSCULAR | Status: DC | PRN

## 2019-04-29 MED ORDER — PROPOFOL 10 MG/ML IV BOLUS
INTRAVENOUS | Status: AC
  Filled 2019-04-29: qty 20

## 2019-04-29 MED ORDER — PHENYLEPHRINE 40 MCG/ML (10ML) SYRINGE FOR IV PUSH (FOR BLOOD PRESSURE SUPPORT)
PREFILLED_SYRINGE | INTRAVENOUS | Status: AC
  Filled 2019-04-29: qty 10

## 2019-04-29 MED ORDER — LEVOFLOXACIN IN D5W 500 MG/100ML IV SOLN
500.0000 mg | Freq: Once | INTRAVENOUS | Status: AC
  Administered 2019-04-29: 500 mg via INTRAVENOUS

## 2019-04-29 MED ORDER — FENTANYL CITRATE (PF) 100 MCG/2ML IJ SOLN
INTRAMUSCULAR | Status: AC
  Filled 2019-04-29: qty 2

## 2019-04-29 MED ORDER — HYDROMORPHONE HCL 1 MG/ML IJ SOLN: 0.2500 mg | INTRAMUSCULAR | Status: DC | PRN

## 2019-04-29 MED ORDER — MIRTAZAPINE 15 MG PO TABS
7.5000 mg | ORAL_TABLET | Freq: Every day | ORAL | Status: DC
  Administered 2019-04-29 – 2019-05-01 (×3): 7.5 mg via ORAL
  Filled 2019-04-29 (×4): qty 1

## 2019-04-29 MED ORDER — LEVOFLOXACIN IN D5W 500 MG/100ML IV SOLN
INTRAVENOUS | Status: AC
  Filled 2019-04-29: qty 100

## 2019-04-29 MED ORDER — LIDOCAINE HCL (CARDIAC) PF 100 MG/5ML IV SOSY
PREFILLED_SYRINGE | INTRAVENOUS | Status: DC | PRN
  Administered 2019-04-29: 40 mg via INTRAVENOUS

## 2019-04-29 MED ORDER — SUCCINYLCHOLINE CHLORIDE 20 MG/ML IJ SOLN
INTRAMUSCULAR | Status: DC | PRN
  Administered 2019-04-29: 120 mg via INTRAVENOUS

## 2019-04-29 MED ORDER — GLUCAGON HCL RDNA (DIAGNOSTIC) 1 MG IJ SOLR
INTRAMUSCULAR | Status: AC
  Filled 2019-04-29: qty 2

## 2019-04-29 MED ORDER — WATER FOR IRRIGATION, STERILE IR SOLN
Status: DC | PRN
  Administered 2019-04-29: 1000 mL via SURGICAL_CAVITY

## 2019-04-29 MED ORDER — LACTATED RINGERS IV SOLN
Freq: Once | INTRAVENOUS | Status: AC
  Administered 2019-04-29: 13:00:00 via INTRAVENOUS

## 2019-04-29 NOTE — Op Note (Signed)
[facility] Patient Name: Pamela Long Procedure Date: 04/29/2019 12:41 PM MRN: [id number] Date of Birth: 03-Feb-1971 Attending MD: Hildred Laser , MD CSN: [id number] Age: 48 Admit Type: Inpatient Procedure:                ERCP Indications:              Jaundice; patient with metastatic breast carcinima. Providers:                Hildred Laser, MD, Janeece Riggers, RN, Nelma Rothman,                            Technician Referring MD:              Medicines:                General Anesthesia Complications:            No immediate complications. Estimated Blood Loss:     Estimated blood loss was minimal. Procedure:                Pre-Anesthesia Assessment:                           - Prior to the procedure, a History and Physical                            was performed, and patient medications and                            allergies were reviewed. The patient's tolerance of                            previous anesthesia was also reviewed. The risks                            and benefits of the procedure and the sedation                            options and risks were discussed with the patient.                            All questions were answered, and informed consent                            was obtained. Prior Anticoagulants: The patient                            last took Lovenox (enoxaparin) 2 days prior to the                            procedure. ASA Grade Assessment: IV - A patient                            with severe systemic disease that is a constant  threat to life. After reviewing the risks and                            benefits, the patient was deemed in satisfactory                            condition to undergo the procedure.                           After obtaining informed consent, the scope was                            passed under direct vision. Throughout the                            procedure, the patient's blood  pressure, pulse, and                            oxygen saturations were monitored continuously. The                            [id number]-[id number]) scope was introduced through                            the mouth, and used to inject contrast into and                            used to cannulate the bile duct. The ERCP was                            technically difficult and complex due to a                            partially obstructing mass. The patient tolerated                            the procedure well. Scope In: 2:01:55 PM Scope Out: 2:58:18 PM Total Procedure Duration: 0 hours 56 minutes 23 seconds  Findings:      A biliary stent was visible on the scout film. The esophagus was       successfully intubated under direct vision. The scope was advanced to a       normal major papilla in the descending duodenum without detailed       examination of the pharynx, larynx and associated structures, and upper       GI tract. The upper GI tract was grossly normal. The bile duct was       deeply cannulated with the 12 mm balloon. Contrast was injected. I       personally interpreted the bile duct images. There was brisk flow of       contrast through the ducts. Image quality was adequate. Contrast       extended to the main bile duct. Contrast extended to the bifurcation.       The left main hepatic duct was completely obstructed by what appeared to  be a mass.Unable to advance 035 or 026 wire into left system. Right       system partially filled.Stricture proximal to proximal margin of wall       stent. Unable to advance 10 or 8.5 Fr. plastice stent into right system. Impression:               - A biliary tract obstruction secondary to what                            appeared to be a mass was found in the left main                            hepatic duct.                           - Biliary wall stent patent with obstruction at                            proximal end secondary to  tumor.                           - Complete obstruction involving left main                            intrahepatic duct.                           - Right system accessed with 035 guidewire but                            unable to place stent. Moderate Sedation:      Per Anesthesia Care Recommendation:           - Return patient to hospital ward for ongoing care.                           - Clear liquid diet today.                           - Continue present medications.                           - Consider external drainage via left system if                            patient wants to proceed. Procedure Code(s):        --- Professional ---                           [phone], Endoscopic retrograde                            cholangiopancreatography (ERCP); diagnostic,                            including collection of specimen(s) by brushing or  washing, when performed (separate procedure) Diagnosis Code(s):        --- Professional ---                           K83.1, Obstruction of bile duct                           R17, Unspecified jaundice CPT copyright 2019 [employer]. All rights reserved. The codes documented in this report are preliminary and upon coder review may  be revised to meet current compliance requirements. Hildred Laser, MD Hildred Laser, MD 04/29/2019 3:37:44 PM This report has been signed electronically. Number of Addenda: 0

## 2019-04-29 NOTE — Anesthesia Procedure Notes (Signed)
Procedure Name: Intubation Date/Time: 04/29/2019 1:33 PM Performed by: Andree Elk, Amy A, CRNA Pre-anesthesia Checklist: Patient identified, Patient being monitored, Timeout performed, Emergency Drugs available and Suction available Patient Re-evaluated:Patient Re-evaluated prior to induction Oxygen Delivery Method: Circle system utilized Preoxygenation: Pre-oxygenation with 100% oxygen Induction Type: IV induction, Rapid sequence and Cricoid Pressure applied Laryngoscope Size: 3 and Glidescope Grade View: Grade I Tube type: Oral Tube size: 7.0 mm Number of attempts: 1 Airway Equipment and Method: Stylet Placement Confirmation: ETT inserted through vocal cords under direct vision,  positive ETCO2 and breath sounds checked- equal and bilateral Secured at: 21 cm Tube secured with: Tape Dental Injury: Teeth and Oropharynx as per pre-operative assessment

## 2019-04-29 NOTE — Transfer of Care (Signed)
Immediate Anesthesia Transfer of Care Note  Patient: Pamela Long  Procedure(s) Performed: ENDOSCOPIC RETROGRADE CHOLANGIOPANCREATOGRAPHY (ERCP) (N/A Esophagus) ESOPHAGOGASTRODUODENOSCOPY (EGD) WITH PROPOFOL (N/A Esophagus) ATTEMPTED BILIARY STENT PLACEMENT (N/A Esophagus)  Patient Location: PACU  Anesthesia Type:General  Level of Consciousness: drowsy and patient cooperative  Airway & Oxygen Therapy: Patient connected to nasal cannula oxygen  Post-op Assessment: Report given to RN and Post -op Vital signs reviewed and stable  Post vital signs: Reviewed and stable  Last Vitals:  Vitals Value Taken Time  BP 113/76 04/29/19 1530  Temp    Pulse 122 04/29/19 1531  Resp 17 04/29/19 1531  SpO2 95 % 04/29/19 1531  Vitals shown include unvalidated device data.  Last Pain:  Vitals:   04/29/19 1309  TempSrc: Oral  PainSc:       Patients Stated Pain Goal: 3 ([id number])  Complications: No apparent anesthesia complications

## 2019-04-29 NOTE — Anesthesia Preprocedure Evaluation (Addendum)
Anesthesia Evaluation  Patient identified by MRN, date of birth, ID band Patient awake    Reviewed: Allergy & Precautions, NPO status , Patient's Chart, lab work & pertinent test results  History of Anesthesia Complications (+) PONV and history of anesthetic complications  Airway Mallampati: II  TM Distance: >3 FB Neck ROM: Full    Dental  (+) Chipped,    Pulmonary shortness of breath (on oxygen), at rest and lying, asthma , pneumonia, resolved,  Pleural effusions IMPRESSION: Bilateral airspace disease unchanged. Small left effusion unchanged.    breath sounds clear to auscultation       Cardiovascular Exercise Tolerance: Poor + DOE  Normal cardiovascular exam Rhythm:Regular Rate:Normal     Neuro/Psych  Headaches, PSYCHIATRIC DISORDERS Anxiety Depression    GI/Hepatic GERD  Medicated and Poorly Controlled,(+) Cirrhosis  (Liver metastasis)  ascites    , Liver metastasis   Endo/Other    Renal/GU      Musculoskeletal   Abdominal   Peds  Hematology  (+) anemia ,   Anesthesia Other Findings Asthma Depression Malignant neoplasm of central portion of left breast in female, estrogen receptor positive (HCC) Migraines Arthralgia Sleep-disordered breathing Positive ANA (antinuclear antibody) Angular cheilitis Class 2 obesity due to excess calories without serious comorbidity with body mass index (BMI) of 35.0 to 35.9 in adult Vitamin D deficiency Prediabetes HLD (hyperlipidemia) OAB (overactive bladder) GERD (gastroesophageal reflux disease) Rectal bleeding Constipation Metastatic breast cancer (HCC) RUQ pain Abdominal pain Goals of care, counseling/discussion Acquired absence of left breast Liver mass Obstructive jaundice HCAP (healthcare-associated pneumonia) Febrile neutropenia (HCC) Hypokalemia Hyponatremia Severe protein-calorie malnutrition (HCC) Sepsis (HCC) Acute on chronic respiratory  failure with hypoxia (HCC) Sepsis due to undetermined organism ([place]) Pleural effusion Palliative care by specialist Community acquired pneumonia of left lung Status post thoracentesis Colitis Chemotherapy-induced neutropenia (HCC) Dehydration Lactic acid acidosis Nausea vomiting and diarrhea Bile duct stricture RUQ abdominal pain Intractable nausea and vomiting Pneumonia Respiratory distress Ascites Malignant pleural effusion Cancer associated pain    Reproductive/Obstetrics                            Anesthesia Physical Anesthesia Plan  ASA: IV  Anesthesia Plan: General   Post-op Pain Management:    Induction: Intravenous  PONV Risk Score and Plan: Ondansetron, Dexamethasone and Midazolam  Airway Management Planned: Oral ETT  Additional Equipment:   Intra-op Plan:   Post-operative Plan: Extubation in OR and Possible Post-op intubation/ventilation  Informed Consent: I have reviewed the patients History and Physical, chart, labs and discussed the procedure including the risks, benefits and alternatives for the proposed anesthesia with the patient or authorized representative who has indicated his/her understanding and acceptance.     Dental advisory given  Plan Discussed with: CRNA  Anesthesia Plan Comments:         Anesthesia Quick Evaluation

## 2019-04-29 NOTE — Progress Notes (Signed)
PROGRESS NOTE    Pamela Long  HFW:[id number] DOB: 12-16-1970 DOA: 04/19/2019 PCP: Doree Albee, MD    Brief Narrative:  48 year old female who presented with cough and dyspnea.  She does have significant past medical history for metastatic breast cancer, depression and asthma.  She reported worsening productive cough, associated with difficulty breathing for about a week.  Positive pleuritic chest pain.  On her initial physical examination her heart rate was 119, oxygen saturation 89% on room air, blood pressure 131/86, respiratory rate 19.  Her lungs were clear to auscultation bilaterally, heart S1-S2 present, tachycardic, abdomen soft, trace lower extremity edema more left than right SARS COVID-19 negative.  Chest radiograph with faint infiltrate right upper lobe, atelectasis left lower lobe with small pleural effusion.   Patient was admitted to the hospital with the working diagnosis of acute hypoxic respiratory failure due to community-acquired pneumonia, present on admission.  She has been responding well to antibiotic therapy with levofloxacin. Underwent left pleural effusion thoracentesis with 980 fluid obtained.  -11/11 left thoracentesis with removal of 980 mL of fluid--fluid cytology from 04/21/2019 with malignant cells 11/17 paracentesis with removal of 3.4 L of fluid-fluid cytology from 04/27/2019 with - Malignant cells consistent with metastatic adenocarcinoma   Assessment & Plan:   Principal Problem:   Pneumonia Active Problems:   Asthma   Depression   Metastatic breast cancer ([place])   Pleural effusion   Respiratory distress   Ascites   Malignant pleural effusion   Cancer associated pain   1. Acute hypoxic respiratory failure due to right upper lobe community acquired pneumonia, complicated with left plural effusion. Patient complained of dyspnea and pleuritic chest pain, oximetry is 97% on 2 LPM per [place]. US guided thoracentesis to the left with 980 ml of yellow  fluid.  -Completed Levaquin -11/11 left thoracentesis with removal of 980 mL of fluid--fluid cytology from 04/21/2019 with malignant cells 11/17 paracentesis with removal of 3.4 L of fluid-fluid cytology from 04/27/2019 with - Malignant cells consistent with metastatic adenocarcinoma  - right-sided pleural effusion was evaluated and was not felt to be significant enough to attempt thoracentesis.  It was noted that she has significant ascites and she has undergone multiple paracentesis which had improved her shortness of breath.  Pleural fluid cytology from 04/21/19 was positive for malignant cells.  2. Metastatic breast cancer/ liver metastais/elevated LFTs. On chemotherapy as outpatient, will follow as outpatient.  LFTs were elevated on admission.  She recently underwent stent placement by Dr. Laural Golden in 03/2019.  GI consulted, appreciate input.  Started on Reglan for nausea and vomiting.  MRCP showed the biliary stent may be partially obstructed by liver mets.  It does not appear to be completely obstructed.  LFTs currently trending up.  Dr. Laural Golden has discussed with the patient we will plan on ERCP on 11/19 for stent exchange.  Oncology is also following and will help develop further plan after procedure.   -AST 286 ([place]), ALT 77 (peak), T bili up to 12.4 (peak), direct bilirubin is 7.2 ,alk phos 629 (PEAK), INR 1.3 -Albumin is 2.6 -Patient is for ERCP on 04/29/2019 with possible stent exchange---  LFTs continue to trend up  3. Hypokalemia and hypomagnesemia.  Replaced. Renal function stable with serum cr at 0.5.   4. HTN.  Blood pressure stable on metoprolol, may use IV metoprolol as needed elevated HR  5. Asthma. Stable with no signs of exacerbation.   6.  Ascites.  Likely related to underlying liver  disease/hypoalbuminemia-in the setting of metastatic breast cancer -.  Requiring frequent paracentesis  -??  If candidate for Pleurx catheter placement  -Recurrent ascites may also be related to  underlying malignant metastatic disease.  Patient has undergone paracentesis x3 during her hospital stay.   --Ascitic fluid from 04/27/2019 with malignant cells consistent with metastatic adenocarcinoma   7)Social/Ethics--- palliative care consult appreciated, patient remains a full code -Fentanyl patch started 04/28/2019 -Overall prognosis is poor/grave  8) intractable nausea/vomiting/anorexia--continue trending down, try Decadron, add Remeron -Palliative care input appreciated with regards to symptom management   DVT prophylaxis: enoxaparin   Code Status:  full Family Communication: no family at the bedside  Disposition Plan/ discharge barriers:  Pending clinical improvement.   Body mass index is 27.12 kg/m. Malnutrition Type:  Nutrition Problem: Severe Malnutrition Etiology: cancer and cancer related treatments(breast cancer with mets to liver; last chemotherapy 10/15)   Malnutrition Characteristics:  Signs/Symptoms: percent weight loss, moderate fat depletion, severe fat depletion, moderate muscle depletion, severe muscle depletion, edema, energy intake < or equal to 75% for > or equal to 1 month, per patient/family report Percent weight loss: 12.6 %(23.5 lbs x 3 months)   Nutrition Interventions:  Interventions: Boost [place]reeze, Education  RN Pressure Injury Documentation:    Consultants:   Oncology  Interventional radiology   Gastroenterology  Palliative care  Procedures:   11/6 paracentesis with removal of 4.9 L of fluid  11/11 left thoracentesis with removal of 980 mL of fluid--fluid cytology from 04/21/2019 with malignant cells  11/13 paracentesis with removal of 4.7 L of fluid  11/17 paracentesis with removal of 3.4 L of fluid-fluid cytology from 04/27/2019 with - Malignant cells consistent with metastatic adenocarcinoma  -ERCP on 04/29/2019  Antimicrobials:   Levofloxacin 11/10 > 11/14   Subjective: -Continues to have emesis -Abdominal pain  persist -Overall just not feeling well -Had small BM  Objective: Vitals:   04/29/19 0535 04/29/19 0640 04/29/19 0650 04/29/19 1302  BP: 123/83   129/85  Pulse: 74 (!) 154 (!) 123 (!) 118  Resp: 18     Temp: 98.1 F (36.7 C)     TempSrc: Oral   Oral  SpO2: 92%   99%  Weight:      Height:       No intake or output data in the 24 hours ending 04/29/19 1305 Filed Weights   04/19/19 1521  Weight: 73.9 kg    Examination:   General exam: Alert, awake, oriented x 3 HEENT-  +ve sclera icterus Respiratory system: Diminished breath sounds at bases, no wheezing Cardiovascular system:RRR. No murmurs, rubs, gallops. Gastrointestinal system: Abdomen is distended, soft with mild epigastric and right upper quadrant discomfort on palpation without rebound or guarding. +ve BS Central nervous system: Alert and oriented. No focal neurological deficits. Extremities: 1+ edema bilaterally Skin: No rashes, lesions or ulcers Psychiatry: Judgement and insight appear normal. Mood & affect is flat  Data Reviewed:  CBC: Recent Labs  Lab 04/23/19 0420 04/28/19 0458 04/29/19 0524  WBC 10.1 9.2 12.5*  NEUTROABS  --   --  10.8*  HGB 11.6* 11.1* 12.0  HCT 36.1 33.4* 35.7*  MCV 80.2 80.1 79.3*  PLT 217 189 240   Basic Metabolic Panel: Recent Labs  Lab 04/23/19 0420 04/24/19 0650 04/27/19 0553 04/28/19 0458 04/29/19 0524  NA 134* 134* 134* 135 136  K 3.8 3.7 3.4* 3.5 3.8  CL 99 98 99 99 101  CO2 _0 GLUCOSE 105* 78 93  101* 93  BUN _0 CREATININE 0.59 0.47 0.51 0.44 0.46  CALCIUM 8.5* 8.5* 8.7* 8.8* 8.8*   GFR: Estimated Creatinine Clearance: 84.7 mL/min (by C-G formula based on SCr of 0.46 mg/dL). Liver Function Tests: Recent Labs  Lab 04/25/19 0631 04/26/19 0447 04/27/19 0553 04/28/19 0458 04/29/19 0524  AST 168* 124* 192* 209* 286*  ALT 47* 35 50* 58* 77*  ALKPHOS 511* 365* 462* 500* 629*  BILITOT 4.3* 5.5* 7.7* 9.3* 12.4*  PROT 5.4* 5.7* 5.7* 5.6*  6.1*  ALBUMIN 1.9* 3.3* 3.0* 2.7* 2.6*   No results for input(s): LIPASE, AMYLASE in the last 168 hours. No results for input(s): AMMONIA in the last 168 hours. Coagulation Profile: Recent Labs  Lab 04/28/19 0458  INR 1.3*   Cardiac Enzymes: No results for input(s): CKTOTAL, CKMB, CKMBINDEX, TROPONINI in the last 168 hours. BNP (last 3 results) No results for input(s): PROBNP in the last 8760 hours. HbA1C: No results for input(s): HGBA1C in the last 72 hours. CBG: No results for input(s): GLUCAP in the last 168 hours. Lipid Profile: No results for input(s): CHOL, HDL, LDLCALC, TRIG, CHOLHDL, LDLDIRECT in the last 72 hours. Thyroid Function Tests: No results for input(s): TSH, T4TOTAL, FREET4, T3FREE, THYROIDAB in the last 72 hours. Anemia Panel: No results for input(s): VITAMINB12, FOLATE, FERRITIN, TIBC, IRON, RETICCTPCT in the last 72 hours.   Radiology Studies:  Scheduled Meds:  [MAR Hold] Chlorhexidine Gluconate Cloth  6 each Topical Daily   [MAR Hold] dexamethasone (DECADRON) injection  8 mg Intravenous Once   [MAR Hold] dexamethasone  2 mg Oral Daily   [MAR Hold] feeding supplement  1 Container Oral TID BM   [MAR Hold] fentaNYL  1 patch Transdermal Q72H   [MAR Hold] gabapentin  300 mg Oral QHS   glucagon (human recombinant)       [MAR Hold] lubiprostone  8 mcg Oral BID WC   [MAR Hold] metoCLOPramide (REGLAN) injection  10 mg Intravenous TID AC   [MAR Hold] metoprolol tartrate  12.5 mg Oral BID   [MAR Hold] pantoprazole (PROTONIX) IV  40 mg Intravenous Q12H   [MAR Hold] promethazine  12.5 mg Intravenous Q4H   Continuous Infusions:  sodium chloride       LOS: 10 days   Roxan Hockey, MD

## 2019-04-29 NOTE — Anesthesia Postprocedure Evaluation (Signed)
Anesthesia Post Note  Patient: Evienne Hibbert Blomberg  Procedure(s) Performed: ENDOSCOPIC RETROGRADE CHOLANGIOPANCREATOGRAPHY (ERCP) (N/A Esophagus) ESOPHAGOGASTRODUODENOSCOPY (EGD) WITH PROPOFOL (N/A Esophagus) ATTEMPTED BILIARY STENT PLACEMENT (N/A Esophagus)  Patient location during evaluation: Nursing Unit Anesthesia Type: General Level of consciousness: awake, oriented and patient cooperative Pain management: pain level controlled Vital Signs Assessment: post-procedure vital signs reviewed and stable Respiratory status: spontaneous breathing Cardiovascular status: stable Postop Assessment: no apparent nausea or vomiting Anesthetic complications: no     Last Vitals:  Vitals:   04/29/19 1530 04/29/19 1555  BP:  112/82  Pulse:  (!) 131  Resp:  18  Temp: 36.6 C   SpO2:  95%    Last Pain:  Vitals:   04/29/19 1555  TempSrc:   PainSc: 0-No pain                 Samai Corea A

## 2019-04-29 NOTE — Consult Note (Signed)
[facility] Oncology Progress Note  Name: Pamela Long      MRN: [id number]    Location: A305/A305-01  Date: 04/29/2019 Time:6:25 PM   Subjective: Interval History:Pamela Long is seen this evening.  She is sitting in the bed.  She is still feeling groggy.  She has been tired from the procedure.  Has some nausea also.  Objective: Vital signs in last 24 hours: Temp:  [97.8 F (36.6 C)-98.2 F (36.8 C)] 97.8 F (36.6 C) (11/19 1530) Pulse Rate:  [74-154] 131 (11/19 1555) Resp:  [18] 18 (11/19 1555) BP: (107-129)/(64-85) 112/82 (11/19 1555) SpO2:  [92 %-99 %] 95 % (11/19 1555)    Intake/Output from previous day: No intake/output data recorded.    Intake/Output this shift: Total I/O In: 650 [I.V.:650] Out: 0    PHYSICAL EXAM: BP 112/82 (BP Location: Left Arm)   Pulse (!) 131   Temp 97.8 F (36.6 C)   Resp 18   Ht 5\' 5"  (1.651 m)   Wt 163 lb (73.9 kg)   SpO2 95%   BMI 27.12 kg/m  General appearance: alert and cooperative Lungs: Clear bilaterally, decreased breath sounds at bases. Abdomen: soft, non-tender; bowel sounds normal; no masses,  no organomegaly Extremities: extremities normal, atraumatic, no cyanosis or edema Neurologic: Grossly normal   Studies/Results: Results for orders placed or performed during the hospital encounter of 04/19/19 (from the past 48 hour(s))  CBC in AM     Status: Abnormal   Collection Time: 04/28/19  4:58 AM  Result Value Ref Range   WBC 9.2 4.0 - 10.5 K/uL   RBC 4.17 3.87 - 5.11 MIL/uL   Hemoglobin 11.1 (L) 12.0 - 15.0 g/dL   HCT 33.4 (L) 36.0 - 46.0 %   MCV 80.1 80.0 - 100.0 fL   MCH 26.6 26.0 - 34.0 pg   MCHC 33.2 30.0 - 36.0 g/dL   RDW 23.9 (H) 11.5 - 15.5 %   Platelets 189 150 - 400 K/uL   nRBC 0.0 0.0 - 0.2 %    Comment: Performed at [facility], [address] [place], [place] [address]  Comprehensive metabolic panel     Status: Abnormal   Collection Time: 04/28/19  4:58 AM  Result Value Ref Range   Sodium 135 135 - 145 mmol/L   Potassium 3.5 3.5 - 5.1 mmol/L   Chloride 99 98 - 111 mmol/L   CO2 25 22 - 32 mmol/L   Glucose, Bld 101 (H) 70 - 99 mg/dL   BUN 10 6 - 20 mg/dL   Creatinine, Ser 0.44 0.44 - 1.00 mg/dL   Calcium 8.8 (L) 8.9 - 10.3 mg/dL   Total Protein 5.6 (L) 6.5 - 8.1 g/dL   Albumin 2.7 (L) 3.5 - 5.0 g/dL   AST 209 (H) 15 - 41 U/L   ALT 58 (H) 0 - 44 U/L   Alkaline Phosphatase 500 (H) 38 - 126 U/L   Total Bilirubin 9.3 (H) 0.3 - 1.2 mg/dL   GFR calc non Af Amer >60 >60 mL/min   GFR calc Af Amer >60 >60 mL/min   Anion gap 11 5 - 15    Comment: Performed at [facility], [address], [place], [place] [address]  Protime-INR     Status: Abnormal   Collection Time: 04/28/19  4:58 AM  Result Value Ref Range   Prothrombin Time 16.2 (H) 11.4 - 15.2 seconds   INR 1.3 (H) 0.8 - 1.2    Comment: (NOTE)  INR goal varies based on device and disease states. Performed at [facility], [address] [place], [place] [address]   CBC with Differential/Platelet     Status: Abnormal   Collection Time: 04/29/19  5:24 AM  Result Value Ref Range   WBC 12.5 (H) 4.0 - 10.5 K/uL   RBC 4.50 3.87 - 5.11 MIL/uL   Hemoglobin 12.0 12.0 - 15.0 g/dL   HCT 35.7 (L) 36.0 - 46.0 %   MCV 79.3 (L) 80.0 - 100.0 fL   MCH 26.7 26.0 - 34.0 pg   MCHC 33.6 30.0 - 36.0 g/dL   RDW 24.8 (H) 11.5 - 15.5 %   Platelets 264 150 - 400 K/uL   nRBC 0.0 0.0 - 0.2 %   Neutrophils Relative % 87 %   Neutro Abs 10.8 (H) 1.7 - 7.7 K/uL   Lymphocytes Relative 4 %   Lymphs Abs 0.6 (L) 0.7 - 4.0 K/uL   Monocytes Relative 8 %   Monocytes Absolute 1.0 0.1 - 1.0 K/uL   Eosinophils Relative 0 %   Eosinophils Absolute 0.0 0.0 - 0.5 K/uL   Basophils Relative 0 %   Basophils Absolute 0.0 0.0 - 0.1 K/uL   Immature Granulocytes 1 %   Abs Immature Granulocytes 0.07 0.00 - 0.07 K/uL    Comment: Performed at [facility], [address] [place], [place] [address]  Comprehensive metabolic panel     Status: Abnormal    Collection Time: 04/29/19  5:24 AM  Result Value Ref Range   Sodium 136 135 - 145 mmol/L   Potassium 3.8 3.5 - 5.1 mmol/L   Chloride 101 98 - 111 mmol/L   CO2 23 22 - 32 mmol/L   Glucose, Bld 93 70 - 99 mg/dL   BUN 13 6 - 20 mg/dL   Creatinine, Ser 0.46 0.44 - 1.00 mg/dL   Calcium 8.8 (L) 8.9 - 10.3 mg/dL   Total Protein 6.1 (L) 6.5 - 8.1 g/dL   Albumin 2.6 (L) 3.5 - 5.0 g/dL   AST 286 (H) 15 - 41 U/L   ALT 77 (H) 0 - 44 U/L   Alkaline Phosphatase 629 (H) 38 - 126 U/L   Total Bilirubin 12.4 (H) 0.3 - 1.2 mg/dL   GFR calc non Af Amer >60 >60 mL/min   GFR calc Af Amer >60 >60 mL/min   Anion gap 12 5 - 15    Comment: Performed at [facility], [address] [place], [place] [address]  Bilirubin, direct     Status: Abnormal   Collection Time: 04/29/19  5:24 AM  Result Value Ref Range   Bilirubin, Direct 7.2 (H) 0.0 - 0.2 mg/dL    Comment: Performed at [facility], [address], [place], [place] [address]   Dg Ercp  Result Date: 04/29/2019 CLINICAL DATA:  48 year old female with a history of jaundice and biliary obstruction EXAM: ERCP TECHNIQUE: Multiple spot images obtained with the fluoroscopic device and submitted for interpretation post-procedure. FLUOROSCOPY TIME:  Fluoroscopy Time: Please refer to the dictated operative report for full details of intraoperative findings and procedure. COMPARISON:  MR 04/26/2019 FINDINGS: Limited images during ERCP. Initial image demonstrates endoscope projecting over the upper abdomen. Metallic biliary stent in place. There is then safety wire cannulation with partial opacification with retrograde contrast. Incomplete opacification of the intrahepatic biliary ducts. IMPRESSION: Limited images during ERCP, as above. Please refer to the dictated operative report for full details of intraoperative findings and procedure. Electronically Signed   By:  Corrie Mckusick D.O.   On: 04/29/2019 16:28     MEDICATIONS: I have reviewed the patient's current  medications.     Assessment/Plan:  1.  Metastatic breast cancer to the liver: -She has worsening total bilirubin.  Today it went up to 12.5. -She underwent EGD and ERCP today.  Moderate portal hypertensive gastropathy.  Biliary Wallstent in place with no occlusion.  Right intrahepatic ducts are smaller in caliber.  Left system did not fill.  Stent could not be placed. -Dr. Laural Golden called and talked to me.  The next best option would be a trial by interventional radiology. -Patient does not feel good today which is likely from the procedure. -If she feels better tomorrow, she would like to try second line chemotherapy.  She can be discharged home to follow-up with me on Monday. -If she feels worse, we talked about going home on hospice.  All questions were answered. The patient knows to call the clinic with any problems, questions or concerns. We can certainly see the patient much sooner if necessary.     Derek Jack

## 2019-04-29 NOTE — Op Note (Signed)
[facility] Patient Name: Pamela Long Procedure Date: 04/29/2019 1:54 PM MRN: [id number] Date of Birth: 07-10-1970 Attending MD: Hildred Laser , MD CSN: [id number] Age: 48 Admit Type: Inpatient Procedure:                Upper GI endoscopy Indications:              Nausea with vomiting Providers:                Hildred Laser, MD, Janeece Riggers, RN, Nelma Rothman,                            Technician Referring MD:              Medicines:                General Anesthesia Complications:            No immediate complications. Estimated Blood Loss:     Estimated blood loss: none. Procedure:                Pre-Anesthesia Assessment:                           - Prior to the procedure, a History and Physical                            was performed, and patient medications and                            allergies were reviewed. The patient's tolerance of                            previous anesthesia was also reviewed. The risks                            and benefits of the procedure and the sedation                            options and risks were discussed with the patient.                            All questions were answered, and informed consent                            was obtained. Prior Anticoagulants: The patient                            last took Lovenox (enoxaparin) 2 days prior to the                            procedure. ASA Grade Assessment: IV - A patient                            with severe systemic disease that is a constant  threat to life. After reviewing the risks and                            benefits, the patient was deemed in satisfactory                            condition to undergo the procedure.                           After obtaining informed consent, the endoscope was                            passed under direct vision. Throughout the                            procedure, the patient's blood pressure, pulse, and                        oxygen saturations were monitored continuously. The                            [id number] scope was introduced through the                            mouth, and advanced to the second part of duodenum.                            The upper GI endoscopy was accomplished without                            difficulty. The patient tolerated the procedure                            well. Scope In: 1:55:49 PM Scope Out: 1:58:59 PM Total Procedure Duration: 0 hours 3 minutes 10 seconds  Findings:      The examined esophagus was normal.      The Z-line was irregular and was found 38 cm from the incisors.      Moderate portal hypertensive gastropathy was found in the entire       examined stomach.      Diffuse moderately congested mucosa without active bleeding and with no       stigmata of bleeding was found in the duodenal bulb and in the second       portion of the duodenum. Impression:               - Normal esophagus.                           - Z-line irregular, 38 cm from the incisors. ?                            Short segment Barretts.                           - Portal hypertensive gastropathy.                           -  Congested duodenal mucosa.                           - No specimens collected. Moderate Sedation:      Per Anesthesia Care Recommendation:           - See the other procedure note for documentation of                            additional recommendations. Procedure Code(s):        --- Professional ---                           [phone], Esophagogastroduodenoscopy, flexible,                            transoral; diagnostic, including collection of                            specimen(s) by brushing or washing, when performed                            (separate procedure) Diagnosis Code(s):        --- Professional ---                           K22.8, Other specified diseases of esophagus                           K76.6, Portal hypertension                            K31.89, Other diseases of stomach and duodenum                           R11.2, Nausea with vomiting, unspecified CPT copyright 2019 [employer]. All rights reserved. The codes documented in this report are preliminary and upon coder review may  be revised to meet current compliance requirements. Hildred Laser, MD Hildred Laser, MD 04/29/2019 3:26:25 PM This report has been signed electronically. Number of Addenda: 0

## 2019-04-29 NOTE — Progress Notes (Signed)
Pamela Long was nauseated and in pain when I assessed her this morning. She spent the majority of the day in the ENDO suite. Once she was back on the unit she was reassessed, given her due IV medications and she is resting now. I will continue to monitor.

## 2019-04-29 NOTE — Progress Notes (Addendum)
Palliative:  48 y.o. female  with past medical history of metastatic breast cancer, asthma, depression, anxietyadmitted on 04/19/2019 with SOB, cough d/t community acquired pneumonia. S/P left thoracentesis 04/21/19 cytology positive for malignant cells. Chemotherapy on hold s/t elevated LFTs last given 02/25/19 but continued progression on therapy. Now with worsening ascites and GI consulting for likely ERCP and potential stenting since bilirubin continues to increase. S/P paracentesis 04/23/19 4.7L and 04/16/19 4.9L.   I met again today with Becca. Selena Batten is an extremely kind person with unfortunate disease. She is still feeling miserable but tells me that she can feel some relief with pain from fentanyl patch. She also reports she is still nauseated but has not vomiting and feels this is a little better. We discussed all these medications causing lethargy and we decided to begin some steroids that could also help with pain/nausea and may counteract some of her lethargy from other medications as well. No other changes to medications.   She tells me that she would like to get through procedure today before discussing next steps and decisions. I told her that I feel this is reasonable that we can continue to work on symptoms and we can continue conversations after her procedure. I unfortunately do not return until Monday 05/03/19 but will continue conversations at this time. IF she discharges prior to Monday recommend outpatient palliative to follow if she does not opt for hospice at home.   All questions/concerns addressed. Emotional support provided.   Exam: Alert. Sleepy; fatigue. Pale, frail. No distress. Sinus tachy HR. Abd distended, soft. Generalized weakness.   Plan: - Pain:             - Added Fentanyl patch 12 mcg/hr 04/28/19.                         - Continue morphine 2 mg every hour prn. May have roxanol 5 mg SL every 2 hours prn at home.              - Oxy-acetaminophen 5-325 mg 1 tablet  every 6 hours prn moderate pain.              - Continue gabapentin.              - Adding decadron bolus IV 8 mg x 1 today. Decadron 2 mg po daily beginning tomorrow. May titrate dose to tolerance and efficacy. If no relief may d/c.  - Nausea:             - Phenergan 12.5 mg every 4 hours SCHEDULED for now. Phenergan is only relief of nausea and has not been lasting her even 4 hours so far. I am hoping that once pain improves maybe her nausea will improve. Will continue to reassess necessity of this dosage and amount of Phenergan.              - Robinul prn.              - Peppermint oil to bedside.   Thor, NP Palliative Medicine Team Pager [phone] (Please see [email or web address] for schedule) Team Phone [phone]    Greater than 50%  of this time was spent counseling and coordinating care related to the above assessment and plan

## 2019-04-29 NOTE — Progress Notes (Signed)
CC:  Elevated LFTs, breast cancer with hepatic metastatic disease with biliary obstruction  Subjective: She vomited several times last night and this morning around 7:30 AM.  Her emesis is thick mucus type secretions.  No chest pain.  She has generalized abdominal pain which is unchanged from yesterday.  Passed a small formed brown bowel movement yesterday and this morning.  No rectal bleeding or melena.  She is NPO for ERCP later today.   Objective:  Vital signs in last 24 hours: Temp:  [97.9 F (36.6 C)-98.6 F (37 C)] 98.1 F (36.7 C) (11/19 0535) Pulse Rate:  [74-154] 123 (11/19 0650) Resp:  [18-20] 18 (11/19 0535) BP: (107-129)/(54-83) 123/83 (11/19 0535) SpO2:  [92 %-97 %] 92 % (11/19 0535) Last BM Date: 04/27/19 General: Ill-appearing 48 year old female alert in no acute distress Eyes: Scleral icterus present, conjunctiva pale Heart: Tachycardic, no murmurs Pulm: Crackles in the bases bilaterally, otherwise clear throughout Abdomen: Protuberant, no significant ascites appreciated, mild generalized tenderness without rebound or guarding, hypoactive bowel sounds to all 4 quadrants, no HSM Extremities: Bilateral lower extremities with 1+ edema Skin: Patient is jaundiced Neurologic:  Alert and  oriented x4;  grossly normal neurologically Psych:  Alert and cooperative. Normal mood and affect.  Intake/Output from previous day: No intake/output data recorded. Intake/Output this shift: No intake/output data recorded.  Lab Results: Recent Labs    04/28/19 0458 04/29/19 0524  WBC 9.2 12.5*  HGB 11.1* 12.0  HCT 33.4* 35.7*  PLT 189 264   BMET Recent Labs    04/27/19 0553 04/28/19 0458 04/29/19 0524  NA 134* 135 136  K 3.4* 3.5 3.8  CL 99 99 101  CO2 _0 GLUCOSE 93 101* 93  BUN _1 CREATININE 0.51 0.44 0.46  CALCIUM 8.7* 8.8* 8.8*   LFT Recent Labs    04/29/19 0524  PROT 6.1*  ALBUMIN 2.6*  AST 286*  ALT 77*  ALKPHOS 629*  BILITOT 12.4*    BILIDIR 7.2*   PT/INR Recent Labs    04/28/19 0458  LABPROT 16.2*  INR 1.3*   Hepatitis Panel No results for input(s): HEPBSAG, HCVAB, HEPAIGM, HEPBIGM in the last 72 hours.  US Paracentesis  Result Date: 04/27/2019 INDICATION: Breast cancer, ascites EXAM: ULTRASOUND GUIDED DIAGNOSTIC AND THERAPEUTIC PARACENTESIS MEDICATIONS: None COMPLICATIONS: None immediate PROCEDURE: Informed written consent was obtained from the patient after a discussion of the risks, benefits and alternatives to treatment. A timeout was performed prior to the initiation of the procedure. Initial ultrasound scanning demonstrates a large amount of ascites within the right lower abdominal quadrant. The right lower abdomen was prepped and draped in the usual sterile fashion. 1% lidocaine was used for local anesthesia. Following this, a 5 [place] Yueh catheter was introduced. An ultrasound image was saved for documentation purposes. The paracentesis was performed. The catheter was removed and a dressing was applied. The patient tolerated the procedure well without immediate post procedural complication. Patient received post-procedure intravenous albumin; see nursing notes for details. FINDINGS: A total of approximately 3.4 L of yellow ascitic fluid was removed. Samples were sent to the laboratory as requested by the clinical team. IMPRESSION: Successful ultrasound-guided paracentesis yielding 3.4 liters of peritoneal fluid. Electronically Signed   By: Lavonia Dana M.D.   On: 04/27/2019 15:21    Assessment / Plan:  84.  48 year old female with a history of breast cancer with hepatic metastasis and biliary obstruction, elevated LFTs. S/P ERCP 09/22/2018 with placement of  a biliary stent which was removed 03/11/2019 and a fully covered wall stent was placed. She developed nausea, vomiting, epigastric pain and SOB. She was admitted to [facility] on 11/9. Elevated T. Bili and LFTs. MRCP 04/26/2019 showed mild progression of hepatic  metastatic disease, the proximal end of the Wallstent was occluded by tumor (as interpreted by Dr. Laural Golden), mild intrahepatic biliary ductal dilatation and moderate to large volume abdominal ascites.T. Bili and LFTs drifting upward.  Today alk phos 629.  AST 286.  ALT 77.  Total bili 12.4.  Elevated WBC 12.5 concerning for developing cholangitis.  She is afebrile. -NPO -ERCP later today with Dr. Dorien Chihuahua.  ERCP benefits and risk discussed including the risk with sedation/general anesthesia, risk of bleeding, perforation, infection and pancreatitis.  Patient to receive Ancef 2 g IV prior to ERCP as verified with Dr. Dorien Chihuahua. -Continue Reglan 10 mg IV Q 8 hrs. Continue Phenergan 12.5 mg IV every 4 hours. -Continue Pantoprazole 40 mg IV every 12 hours -? Should patient be on  Zosyn  2.  Ascites is likely due to breast cancer with hepatic metastasis versus cirrhosis. She underwent a paracentesis 11/17, 3.4 L of ascitic fluid was removed.  Cytology report confirmed malignant cells consistent with metastatic adenocarcinoma  3.  Bilateral pleural effusions status post left thoracentesis 04/16/2019, cytology is positive for malignant cells. Right upper lobe pneumonia.  No longer on antibiotics.  4.  Gallbladder wall thickening, unlikely acute cholecystitis  5.  Normocytic anemia.   Hemoglobin 12.0.  Hematocrit 35.7.  Platelet 264. Signs of active GI bleeding. -Repeat CBC in a.m.  6.  Hypoalbuminemia.  Albumin 2.6.   Principal Problem:   Pneumonia Active Problems:   Asthma   Depression   Metastatic breast cancer (HCC)   Pleural effusion   Respiratory distress   Ascites   Malignant pleural effusion   Cancer associated pain     LOS: 10 days   Noralyn Pick  04/29/2019, 8:01 AM

## 2019-04-29 NOTE — Progress Notes (Signed)
Brief EGD and ERCP notes.  Normal mucosa of the esophagus.  No varices identified. Serrated GE junction. Moderate portal hypertensive gastropathy. Marked edema to bulbar and post bulbar mucosa with luminal compromise Biliary Wallstent in place. Occlusion cholangiogram obtained.  Wallstent is patent. Small amount of contrast injected to outline biliary anatomy. Right intrahepatic ducts are small caliber. Left system not fill.  Only small amount of contrast noted in left system close to hilum. Able to advance 035 guidewire into the right system but not into the left system. Unable to place 10 or 8.5 [place] plastic biliary stents.

## 2019-04-30 DIAGNOSIS — K831 Obstruction of bile duct: Secondary | ICD-10-CM

## 2019-04-30 DIAGNOSIS — G893 Neoplasm related pain (acute) (chronic): Secondary | ICD-10-CM

## 2019-04-30 DIAGNOSIS — R188 Other ascites: Secondary | ICD-10-CM

## 2019-04-30 DIAGNOSIS — Z9889 Other specified postprocedural states: Secondary | ICD-10-CM

## 2019-04-30 DIAGNOSIS — R918 Other nonspecific abnormal finding of lung field: Secondary | ICD-10-CM

## 2019-04-30 LAB — CBC
HCT: 33.1 % — ABNORMAL LOW (ref 36.0–46.0)
Hemoglobin: 11.1 g/dL — ABNORMAL LOW (ref 12.0–15.0)
MCH: 27.1 pg (ref 26.0–34.0)
MCHC: 33.5 g/dL (ref 30.0–36.0)
MCV: 80.7 fL (ref 80.0–100.0)
Platelets: 232 10*3/uL (ref 150–400)
RBC: 4.1 MIL/uL (ref 3.87–5.11)
RDW: 25.2 % — ABNORMAL HIGH (ref 11.5–15.5)
WBC: 10 10*3/uL (ref 4.0–10.5)
nRBC: 0 % (ref 0.0–0.2)

## 2019-04-30 LAB — COMPREHENSIVE METABOLIC PANEL
ALT: 91 U/L — ABNORMAL HIGH (ref 0–44)
AST: 324 U/L — ABNORMAL HIGH (ref 15–41)
Albumin: 2.4 g/dL — ABNORMAL LOW (ref 3.5–5.0)
Alkaline Phosphatase: 700 U/L — ABNORMAL HIGH (ref 38–126)
Anion gap: 11 (ref 5–15)
BUN: 20 mg/dL (ref 6–20)
CO2: 24 mmol/L (ref 22–32)
Calcium: 9 mg/dL (ref 8.9–10.3)
Chloride: 100 mmol/L (ref 98–111)
Creatinine, Ser: 0.5 mg/dL (ref 0.44–1.00)
GFR calc Af Amer: >60 mL/min (ref >60)
GFR calc non Af Amer: >60 mL/min (ref >60)
Glucose, Bld: 132 mg/dL — ABNORMAL HIGH (ref 70–99)
Potassium: 4 mmol/L (ref 3.5–5.1)
Sodium: 135 mmol/L (ref 135–145)
Total Bilirubin: 13.1 mg/dL — ABNORMAL HIGH (ref 0.3–1.2)
Total Protein: 5.8 g/dL — ABNORMAL LOW (ref 6.5–8.1)

## 2019-04-30 NOTE — Progress Notes (Addendum)
Subjective: Some mild abdominal pain. Feels groggy "from the Phenergan." Discussed options for advanced disease. States she is leaning heavily toward hospice but wants to talk to her family and doctor tonight. Is amendable to IR drain placement and possible Pleurex placement at the same time. No other overt GI complaints.  Objective: Vital signs in last 24 hours: Temp:  [97.8 F (36.6 C)-98.4 F (36.9 C)] 98.4 F (36.9 C) (11/20 0611) Pulse Rate:  [104-131] 104 (11/20 0611) Resp:  [18] 18 (11/20 0611) BP: (101-129)/(50-85) 101/50 (11/20 0611) SpO2:  [95 %-99 %] 97 % (11/20 0611) Last BM Date: 04/28/19 General:   Alert and oriented x 3, pleasant but seems groggy and mildly confused in general. Head:  Normocephalic and atraumatic. Eyes:  No icterus, sclera clear. Conjuctiva pink.  Heart:  S1, S2 present, no murmurs noted.  Lungs: Clear to auscultation bilaterally, without wheezing, rales, or rhonchi.  Abdomen:  Bowel sounds present, soft, non-distended. Mild to moderate midabdominal TTP. No tense ascites. No HSM or hernias noted. No rebound or guarding. No masses appreciated  Msk:  Symmetrical without gross deformities. Normal posture. Pulses:  Normal bilateral DP pulses noted. Extremities:  Without clubbing or edema. Skin:  Warm and dry, intact without significant lesions.  Psych:  Alert and cooperative. Normal mood and affect.  Intake/Output from previous day: 11/19 0701 - 11/20 0700 In: 990 [P.O.:240; I.V.:650; IV Piggyback:100] Out: 0  Intake/Output this shift: No intake/output data recorded.  Lab Results: Recent Labs    04/28/19 0458 04/29/19 0524 04/30/19 0438  WBC 9.2 12.5* 10.0  HGB 11.1* 12.0 11.1*  HCT 33.4* 35.7* 33.1*  PLT 189 264 232   BMET Recent Labs    04/28/19 0458 04/29/19 0524 04/30/19 0438  NA 135 136 135  K 3.5 3.8 4.0  CL 99 101 100  CO2 25 23 24   GLUCOSE 101* 93 132*  BUN 10 13 20   CREATININE 0.44 0.46 0.50  CALCIUM 8.8* 8.8* 9.0    LFT Recent Labs    04/28/19 0458 04/29/19 0524 04/30/19 0438  PROT 5.6* 6.1* 5.8*  ALBUMIN 2.7* 2.6* 2.4*  AST 209* 286* 324*  ALT 58* 77* 91*  ALKPHOS 500* 629* 700*  BILITOT 9.3* 12.4* 13.1*  BILIDIR  --  7.2*  --    PT/INR Recent Labs    04/28/19 0458  LABPROT 16.2*  INR 1.3*   Hepatitis Panel No results for input(s): HEPBSAG, HCVAB, HEPAIGM, HEPBIGM in the last 72 hours.   Studies/Results: Dg Ercp  Result Date: 04/29/2019 CLINICAL DATA:  48 year old female with a history of jaundice and biliary obstruction EXAM: ERCP TECHNIQUE: Multiple spot images obtained with the fluoroscopic device and submitted for interpretation post-procedure. FLUOROSCOPY TIME:  Fluoroscopy Time: Please refer to the dictated operative report for full details of intraoperative findings and procedure. COMPARISON:  MR 04/26/2019 FINDINGS: Limited images during ERCP. Initial image demonstrates endoscope projecting over the upper abdomen. Metallic biliary stent in place. There is then safety wire cannulation with partial opacification with retrograde contrast. Incomplete opacification of the intrahepatic biliary ducts. IMPRESSION: Limited images during ERCP, as above. Please refer to the dictated operative report for full details of intraoperative findings and procedure. Electronically Signed   By: Corrie Mckusick D.O.   On: 04/29/2019 16:28    Assessment: Pleasant 48 year old female with unfortunate situation of breast cancer with hepatic metastasis, biliary obstruction with elevated LFTs.  Had ERCP 09/22/2018 and biliary stent placement that was removed 03/11/2019 and changed out for  a fully covered Wallstent.  She was admitted for nausea, vomiting, epigastric pain, shortness of breath.  Continued elevated total bilirubin and LFTs.  MRCP 04/26/2019 showed mild progression of hepatic metastatic disease and the proximal end of the Wallstent occluded by tumor.  Mild intrahepatic biliary ductal dilation and  moderate to large volume abdominal ascites.  Elevated white blood cell count at 12.5 concerning for possible cholangitis.  She underwent ERCP yesterday with noted Wallstent in place and patent.  Right side intrahepatic ducts small in caliber and left system occluded and did not fill.  Unable to place further biliary stents.  Dr. Laural Golden spoke with Dr. Earleen Newport with interventional radiology who is agreeable to placement of obstructive jaundice biliary drain.  This cannot be completed until Monday.  Discussed with the hospitalist and the patient and she is leaning heavily towards hospice.  She is agreeable to possible Pleurx drain placement for recurrent ascites and pulmonary fluid collections related to her cancer.  She wants to discuss with her family and her doctor this evening to make final decision on hospice.  Query possibility of adding Pleurx drains while biliary stent placement on Monday.  This will depend on the patient's final determination.  In the interim her LFTs continue to elevate.  She is somewhat groggy and confused today but this could be due to pain medication and Phenergan given approximately 45 minutes ago.  Concern for the possibility of becoming encephalopathic with elevated LFTs and progressing disease.  Today her total bilirubin is up to 13.1, alkaline phosphatase at 700, AST/ALT at 324/91.  Some further decline of hemoglobin to 11.1 today which is within her baseline.  Plan: 1. Continue to monitor CBC 2. Monitor LFTs 3. We will discuss with Dr. Oneida Alar placement of order for biliary drain placement by interventional radiology on Monday.  We may need to hold off until she decides if she desires a Pleurx drainage with hospice and, if so, is interventional radiology able to do this at the same time. 4. Supportive measures 5. Will hold off on order for percutaneous drainage until decision is made about possible pleurex drain placement/hospice decision   Thank you for allowing [place] to  participate in the care of Pamela Long  [name], DNP, AGNP-C Adult & Gerontological Nurse Practitioner [facility]     LOS: 11 days    04/30/2019, 11:13 AM

## 2019-04-30 NOTE — Progress Notes (Signed)
PROGRESS NOTE    Pamela Long  [id number] DOB: 1971/05/11 DOA: 04/19/2019 PCP: Doree Albee, MD    Brief Narrative:  48 year old female who presented with cough and dyspnea.  She does have significant past medical history for metastatic breast cancer, depression and asthma.  She reported worsening productive cough, associated with difficulty breathing for about a week.  Positive pleuritic chest pain.  On her initial physical examination her heart rate was 119, oxygen saturation 89% on room air, blood pressure 131/86, respiratory rate 19.  Her lungs were clear to auscultation bilaterally, heart S1-S2 present, tachycardic, abdomen soft, trace lower extremity edema more left than right SARS COVID-19 negative.  Chest radiograph with faint infiltrate right upper lobe, atelectasis left lower lobe with small pleural effusion.   Patient was admitted to the hospital with the working diagnosis of acute hypoxic respiratory failure due to community-acquired pneumonia, present on admission.  She has been responding well to antibiotic therapy with levofloxacin. Underwent left pleural effusion thoracentesis with 980 fluid obtained.  -11/11 left thoracentesis with removal of 980 mL of fluid--fluid cytology from 04/21/2019 with malignant cells 11/17 paracentesis with removal of 3.4 L of fluid-fluid cytology from 04/27/2019 with - Malignant cells consistent with metastatic adenocarcinoma   Assessment & Plan:   Principal Problem:   Pneumonia Active Problems:   Asthma   Depression   Metastatic breast cancer ([place])   Pleural effusion   Respiratory distress   Ascites   Malignant pleural effusion   Cancer associated pain   Pulmonary infiltrate   1. Acute hypoxic respiratory failure due to right upper lobe community acquired pneumonia, complicated with left plural effusion. Patient complained of dyspnea and pleuritic chest pain, oximetry is 97% on 2 LPM per [place]. US guided thoracentesis to the  left with 980 ml of yellow fluid.  -Completed Levaquin -11/11 left thoracentesis with removal of 980 mL of fluid--fluid cytology from 04/21/2019 with malignant cells 11/17 paracentesis with removal of 3.4 L of fluid-fluid cytology from 04/27/2019 with - Malignant cells consistent with metastatic adenocarcinoma  - right-sided pleural effusion was evaluated and was not felt to be significant enough to attempt thoracentesis.  It was noted that she has significant ascites and she has undergone multiple paracentesis which had improved her shortness of breath.  Pleural fluid cytology from 04/21/19 was positive for malignant cells.  2. Metastatic breast cancer/ liver metastais/elevated LFTs. On chemotherapy as outpatient, will follow as outpatient.  LFTs were elevated on admission.  She recently underwent stent placement by Dr. Laural Golden in 03/2019.  GI consulted, appreciate input.  Started on Reglan for nausea and vomiting.  MRCP showed the biliary stent may be partially obstructed by liver mets.  It does not appear to be completely obstructed.  LFTs currently trending up.  Dr. Laural Golden has discussed with the patient we will plan on ERCP on 11/19 for stent exchange.  Oncology is also following and will help develop further plan after procedure.   -Patient is for ERCP on 04/29/2019 however unable to complete stent exchange---  LFTs continue to trend up -Plan is for possible IR placement of biliary drain on Monday, 05/03/2019  3. Hypokalemia and hypomagnesemia.  Replaced. Renal function stable with serum cr at 0.5.   4. HTN.  Blood pressure stable on metoprolol, may use IV metoprolol as needed elevated HR  5. Asthma. Stable with no signs of exacerbation.   6.  Ascites.  Likely related to underlying liver disease/hypoalbuminemia-in the setting of metastatic breast cancer -.  Requiring frequent paracentesis  -Possible placement of Pleurx catheter placement to avoid recurrent paracentesis-- IR can place this on  Monday, 05/03/2019 at [facility] while placing biliary drain -Recurrent ascites may also be related to underlying malignant metastatic disease.  Patient has undergone paracentesis x3 during her hospital stay.   --Ascitic fluid from 04/27/2019 with malignant cells consistent with metastatic adenocarcinoma   7)Social/Ethics--- palliative care consult appreciated, patient remains a full code -Fentanyl patch started 04/28/2019 -Overall prognosis is poor/grave -Pastoral/spiritual care counseling noted -Patient looking to apoint HCPOA and complete advanced Directives  8) intractable nausea/vomiting/anorexia--nausea persist, continue Phenergan, Decadron and Remeron -Consider stopping Remeron if excessive lethargy persists -Palliative care input appreciated with regards to symptom management  10)Disposition- Possible placement of Pleurx catheter placement to avoid recurrent paracentesis-- IR can place this on Monday, 05/03/2019 at [facility] while placing biliary drain -Patient is leaning towards enrolling in hospice once IR procedures are completed  DVT prophylaxis: enoxaparin   Code Status:  full Family Communication: no family at the bedside  Disposition Plan/ discharge barriers:  Pending clinical improvement.   Body mass index is 27.12 kg/m. Malnutrition Type:  Nutrition Problem: Severe Malnutrition Etiology: cancer and cancer related treatments(breast cancer with mets to liver; last chemotherapy 10/15)   Malnutrition Characteristics:  Signs/Symptoms: percent weight loss, moderate fat depletion, severe fat depletion, moderate muscle depletion, severe muscle depletion, edema, energy intake < or equal to 75% for > or equal to 1 month, per patient/family report Percent weight loss: 12.6 %(23.5 lbs x 3 months)   Nutrition Interventions:  Interventions: Boost Breeze, Education  RN Pressure Injury Documentation:    Consultants:   Oncology  Interventional radiology    Gastroenterology  Palliative care  Procedures:   11/6 paracentesis with removal of 4.9 L of fluid  11/11 left thoracentesis with removal of 980 mL of fluid--fluid cytology from 04/21/2019 with malignant cells  11/13 paracentesis with removal of 4.7 L of fluid  11/17 paracentesis with removal of 3.4 L of fluid-fluid cytology from 04/27/2019 with - Malignant cells consistent with metastatic adenocarcinoma  -ERCP on 04/29/2019  Antimicrobials:   Levofloxacin 11/10 > 11/14   Subjective: -Sleepy on and off--- suspect due to Phenergan -Oral intake is not great -Nausea persist  Objective: Vitals:   04/29/19 2108 04/29/19 2202 04/30/19 0206 04/30/19 0611  BP: 108/71  116/82 (!) 101/50  Pulse: (!) 131 (!) 120 (!) 108 (!) 104  Resp: 18  18 18   Temp: 97.8 F (36.6 C)   98.4 F (36.9 C)  TempSrc: Oral   Oral  SpO2: 95%  95% 97%  Weight:      Height:        Intake/Output Summary (Last 24 hours) at 04/30/2019 1848 Last data filed at 04/30/2019 1700 Gross per 24 hour  Intake 1060 ml  Output -  Net 1060 ml   Filed Weights   04/19/19 1521  Weight: 73.9 kg    Examination:   General exam: Alert, awake, oriented x 3 HEENT-  +ve sclera icterus Respiratory system: Diminished breath sounds at bases, no wheezing Cardiovascular system:RRR. No murmurs, rubs, gallops. Gastrointestinal system: Abdomen is distended, soft with mild epigastric and right upper quadrant discomfort on palpation without rebound or guarding. +ve BS Central nervous system: Alert and oriented. No focal neurological deficits. Extremities: 1+ edema bilaterally Skin: No rashes, lesions or ulcers Psychiatry: Judgement and insight appear normal. Mood & affect is flat  Data Reviewed:  CBC: Recent Labs  Lab 04/28/19 0458 04/29/19 0524  04/30/19 0438  WBC 9.2 12.5* 10.0  NEUTROABS  --  10.8*  --   HGB 11.1* 12.0 11.1*  HCT 33.4* 35.7* 33.1*  MCV 80.1 79.3* 80.7  PLT 189 264 [id number]   Basic Metabolic Panel:  Recent Labs  Lab 04/24/19 0650 04/27/19 0553 04/28/19 0458 04/29/19 0524 04/30/19 0438  NA 134* 134* 135 136 135  K 3.7 3.4* 3.5 3.8 4.0  CL 98 99 99 101 100  CO2 26 25 25 23 24   GLUCOSE 78 93 101* 93 132*  BUN 10 9 10 13 20   CREATININE 0.47 0.51 0.44 0.46 0.50  CALCIUM 8.5* 8.7* 8.8* 8.8* 9.0   GFR: Estimated Creatinine Clearance: 84.7 mL/min (by C-G formula based on SCr of 0.5 mg/dL). Liver Function Tests: Recent Labs  Lab 04/26/19 0447 04/27/19 0553 04/28/19 0458 04/29/19 0524 04/30/19 0438  AST 124* 192* 209* 286* 324*  ALT 35 50* 58* 77* 91*  ALKPHOS 365* 462* 500* 629* 700*  BILITOT 5.5* 7.7* 9.3* 12.4* 13.1*  PROT 5.7* 5.7* 5.6* 6.1* 5.8*  ALBUMIN 3.3* 3.0* 2.7* 2.6* 2.4*   No results for input(s): LIPASE, AMYLASE in the last 168 hours. No results for input(s): AMMONIA in the last 168 hours. Coagulation Profile: Recent Labs  Lab 04/28/19 0458  INR 1.3*   Cardiac Enzymes: No results for input(s): CKTOTAL, CKMB, CKMBINDEX, TROPONINI in the last 168 hours. BNP (last 3 results) No results for input(s): PROBNP in the last 8760 hours. HbA1C: No results for input(s): HGBA1C in the last 72 hours. CBG: No results for input(s): GLUCAP in the last 168 hours. Lipid Profile: No results for input(s): CHOL, HDL, LDLCALC, TRIG, CHOLHDL, LDLDIRECT in the last 72 hours. Thyroid Function Tests: No results for input(s): TSH, T4TOTAL, FREET4, T3FREE, THYROIDAB in the last 72 hours. Anemia Panel: No results for input(s): VITAMINB12, FOLATE, FERRITIN, TIBC, IRON, RETICCTPCT in the last 72 hours.   Radiology Studies:  Scheduled Meds: . Chlorhexidine Gluconate Cloth  6 each Topical Daily  . dexamethasone  2 mg Oral Daily  . feeding supplement  1 Container Oral TID BM  . fentaNYL  1 patch Transdermal Q72H  . gabapentin  300 mg Oral QHS  . lubiprostone  8 mcg Oral BID WC  . metoCLOPramide (REGLAN) injection  10 mg Intravenous TID AC  . metoprolol tartrate  12.5 mg Oral  BID  . mirtazapine  7.5 mg Oral QHS  . pantoprazole (PROTONIX) IV  40 mg Intravenous Q12H  . promethazine  12.5 mg Intravenous Q4H   Continuous Infusions: . levofloxacin (LEVAQUIN) IV 500 mg (04/29/19 1735)     LOS: 11 days   Roxan Hockey, MD

## 2019-04-30 NOTE — Progress Notes (Signed)
Present with Ms Commins for spiritual support. She remains groggy and at times not connected with the conversation. Shared with her what support I could offer as it relates to meaning, faith, and processing of her illness, all to be focused on her needs.   I did follow up with her about who is her decision maker, should she be unable to communicate her health care decisions. She shared she'd like to talk with "Kathryne Sharper" about this, along with Jackelyn Poling and Margarita Grizzle. I shared legally about who could speak for her if she doesn't have the HCPOA in place. Due to her being groggy still, I reviewed the paperwork and left that with her.   She expressed the desire to have her daughter meet with her physician and nurse.   Will follow up if possible.

## 2019-04-30 NOTE — Progress Notes (Signed)
Mews score yellow, Heart rate 120-130's. MD notified.

## 2019-05-01 DIAGNOSIS — R748 Abnormal levels of other serum enzymes: Secondary | ICD-10-CM

## 2019-05-01 MED ORDER — LACTULOSE 10 GM/15ML PO SOLN
30.0000 g | Freq: Three times a day (TID) | ORAL | Status: AC
  Administered 2019-05-01 – 2019-05-02 (×5): 30 g via ORAL
  Filled 2019-05-01 (×5): qty 60

## 2019-05-01 MED ORDER — LINACLOTIDE 145 MCG PO CAPS
290.0000 ug | ORAL_CAPSULE | Freq: Every day | ORAL | Status: DC
  Administered 2019-05-01 – 2019-05-02 (×2): 290 ug via ORAL
  Filled 2019-05-01 (×3): qty 2

## 2019-05-01 MED ORDER — PROMETHAZINE HCL 25 MG/ML IJ SOLN: 6.2500 mg | Freq: Four times a day (QID) | INTRAMUSCULAR | Status: DC

## 2019-05-01 MED ORDER — PROCHLORPERAZINE EDISYLATE 10 MG/2ML IJ SOLN
10.0000 mg | Freq: Four times a day (QID) | INTRAMUSCULAR | Status: DC
  Administered 2019-05-01 – 2019-05-02 (×4): 10 mg via INTRAVENOUS
  Filled 2019-05-01 (×4): qty 2

## 2019-05-01 MED ORDER — PANTOPRAZOLE SODIUM 40 MG PO TBEC
40.0000 mg | DELAYED_RELEASE_TABLET | Freq: Two times a day (BID) | ORAL | Status: DC
  Administered 2019-05-01 – 2019-05-03 (×5): 40 mg via ORAL
  Filled 2019-05-01 (×5): qty 1

## 2019-05-01 MED ORDER — PROMETHAZINE HCL 25 MG/ML IJ SOLN
6.2500 mg | INTRAMUSCULAR | Status: DC
  Administered 2019-05-01 – 2019-05-02 (×5): 6.25 mg via INTRAVENOUS
  Filled 2019-05-01 (×5): qty 1

## 2019-05-01 NOTE — Progress Notes (Signed)
Patient's heart rate 112 resulting in a MEWS score of 2, [place] ICU RN informed, patient stable at this time

## 2019-05-01 NOTE — Progress Notes (Signed)
   Assessment/Plan: Admitted with HEPATIC DECOMPENSATION MOST LIKELY DUE TO HEPATOCELLULAR DISEASE IN THE SETTING OF METASTATIC BREAST DISEASE. NAUSEA AND CONSTIPATION NOT IDEALLY CONTROLLED.  PLAN: 1. 1100-DISCUSSED CASE WITH DR. Reesa Chew. PT AT HIGH RISK FOR COMPLICATIONS FROM PTC DUE TO TUMOR BURDEN IN LIVER AND SMALL DUCTS. AGREES PT COULD BENEFIT FROM PLEURX. DISCUSSED RISKS OF PTC AND POOR PROGNOSIS WITH PT. WILL SCHEDULE PLEURX FOR MON NOV 23. 2. DUE TO NAUSEA, STOP AMITIZA. ADD COMPAZINE 10 MG IV Q6H. CONTINUE ATC PHENERGAN. STOP ATC REGLAN WHICH CAN CONTRIBUTE TO DEPRESSION. OK TO CONTINUE PRN REGLAN. 3. LAST DOSE LEVAQUIN TODAY.     Subjective: Since I last evaluated the patient PT C/O NAUSEA NOT IDEALLY CONTROLLED. LAST BM 4 DAYS AGO. PT DOES NOT RECALL TALKING TO PALLIATIVE CARE. ONCOLOGY PLANS TO OFFER 2ND LINE CHEMO.  Objective: Vital signs in last 24 hours: Vitals:   05/01/19 0538 05/01/19 0541  BP: 118/84 (!) 107/58  Pulse: (!) 131 84  Resp: 18 18  Temp: (!) 97.5 F (36.4 C) 98.1 F (36.7 C)  SpO2: (!) 89% 97%   General appearance: alert, cooperative and mild distress Resp: clear to auscultation bilaterally Cardio: regular rate and rhythm GI: soft, MILD tenderness, distended; bowel sounds normal;  Extremities: no cyanosis or edema  Lab Results:   T BILI 13.1 HB 11.1 PLT CT 232 ALB 2.4 AST 324 ALT 91 AK PHOS 700    Studies/Results: No results found.  Medications: I have reviewed the patient's current medications.

## 2019-05-01 NOTE — Progress Notes (Signed)
PROGRESS NOTE    Pamela Long  [id number] DOB: [birth year] DOA: 04/19/2019 PCP: Doree Albee, MD    Brief Narrative:  48 year old female who presented with cough and dyspnea.  She does have significant past medical history for metastatic breast cancer, depression and asthma.  She reported worsening productive cough, associated with difficulty breathing for about a week.  Positive pleuritic chest pain.  On her initial physical examination her heart rate was 119, oxygen saturation 89% on room air, blood pressure 131/86, respiratory rate 19.  Her lungs were clear to auscultation bilaterally, heart S1-S2 present, tachycardic, abdomen soft, trace lower extremity edema more left than right SARS COVID-19 negative.  Chest radiograph with faint infiltrate right upper lobe, atelectasis left lower lobe with small pleural effusion.   Patient was admitted to the hospital with the working diagnosis of acute hypoxic respiratory failure due to community-acquired pneumonia, present on admission.  She has been responding well to antibiotic therapy with levofloxacin. Underwent left pleural effusion thoracentesis with 980 fluid obtained.  -11/11 left thoracentesis with removal of 980 mL of fluid--fluid cytology from 04/21/2019 with malignant cells 11/17 paracentesis with removal of 3.4 L of fluid-fluid cytology from 04/27/2019 with - Malignant cells consistent with metastatic adenocarcinoma   Assessment & Plan:   Principal Problem:   Pneumonia Active Problems:   Asthma   Depression   Metastatic breast cancer ([place])   Pleural effusion   Respiratory distress   Ascites   Malignant pleural effusion   Cancer associated pain   Pulmonary infiltrate   1. Acute hypoxic respiratory failure due to right upper lobe community acquired pneumonia, complicated with left plural effusion. Patient complained of dyspnea and pleuritic chest pain, oximetry is 97% on 2 LPM per [place]. US guided thoracentesis to the  left with 980 ml of yellow fluid.  -11/11 left thoracentesis with removal of 980 mL of fluid--fluid cytology from 04/21/2019 with malignant cells 11/17 paracentesis with removal of 3.4 L of fluid-fluid cytology from 04/27/2019 with - Malignant cells consistent with metastatic adenocarcinoma  - right-sided pleural effusion was evaluated and was not felt to be significant enough to attempt thoracentesis.  It was noted that she has significant ascites and she has undergone multiple paracentesis which had improved her shortness of breath.  Pleural fluid cytology from 04/21/19 was positive for malignant cells.  2. Metastatic breast cancer/ liver metastais/elevated LFTs. On chemotherapy as outpatient, will follow as outpatient.  LFTs were elevated on admission.  She recently underwent stent placement by Dr. Laural Golden in 03/2019.  GI consulted, appreciate input.  Started on Reglan for nausea and vomiting.  MRCP showed the biliary stent may be partially obstructed by liver mets.  It does not appear to be completely obstructed.  LFTs currently trending up.  Dr. Laural Golden has discussed with the patient we will plan on ERCP on 11/19 for stent exchange.  Oncology is also following and will help develop further plan after procedure.   -Patient had ERCP on 04/29/2019 however unable to complete stent exchange---  LFTs continue to trend up -As per Dr Reesa Chew (IR) --- patient is not a candidate for percutaneous placement of biliary drain due to lack of significant biliary duct dilatation and small ducts   3. Hypokalemia and hypomagnesemia.  Replaced. Renal function stable with serum cr at 0.5.   4. HTN.  Blood pressure stable on metoprolol, may use IV metoprolol as needed elevated HR  5. Asthma. Stable with no signs of exacerbation.   6.  Ascites.  Likely related to underlying liver disease/hypoalbuminemia-in the setting of metastatic breast cancer -.  Requiring frequent paracentesis  --Plan is for abdominal Pleurx  catheter placement at [facility] by IR on 05/03/2019 due to recurrent ascites -Recurrent ascites may also be related to underlying malignant metastatic disease.  Patient has undergone paracentesis x3 during her hospital stay.   --Ascitic fluid from 04/27/2019 with malignant cells consistent with metastatic adenocarcinoma   7)Social/Ethics--- palliative care consult appreciated, patient remains a full code -Fentanyl patch started 04/28/2019 -Overall prognosis is poor/grave -Pastoral/spiritual care counseling noted -Patient looking to apoint HCPOA and complete advanced Directives -Possible discharge home after Pleurx catheter placement with hospice services  8) intractable nausea/vomiting/anorexia--nausea persist, continue Phenergan, Decadron and Remeron -Around-the-clock Reglan discontinued, changed to as needed -Amitiza discontinued by GI service -Around-the-clock Compazine discontinued by GI service -Consider stopping Remeron if excessive lethargy persists -Palliative care input appreciated with regards to symptom management  10)Disposition-plan is for IR to place abdominal Pleurx catheter  to avoid recurrent paracentesis-- on Monday, 05/03/2019 at [facility]  -Patient is leaning towards enrolling in hospice once IR procedures are completed  DVT prophylaxis: enoxaparin   Code Status:  full Family Communication: no family at the bedside     Body mass index is 27.12 kg/m. Malnutrition Type:  Nutrition Problem: Severe Malnutrition Etiology: cancer and cancer related treatments(breast cancer with mets to liver; last chemotherapy 10/15)   Malnutrition Characteristics:  Signs/Symptoms: percent weight loss, moderate fat depletion, severe fat depletion, moderate muscle depletion, severe muscle depletion, edema, energy intake < or equal to 75% for > or equal to 1 month, per patient/family report Percent weight loss: 12.6 %(23.5 lbs x 3 months)   Nutrition Interventions:   Interventions: Boost Breeze, Education  RN Pressure Injury Documentation:    Consultants:   Oncology  Interventional radiology   Gastroenterology  Palliative care  Procedures:   11/6 paracentesis with removal of 4.9 L of fluid  11/11 left thoracentesis with removal of 980 mL of fluid--fluid cytology from 04/21/2019 with malignant cells  11/13 paracentesis with removal of 4.7 L of fluid  11/17 paracentesis with removal of 3.4 L of fluid-fluid cytology from 04/27/2019 with - Malignant cells consistent with metastatic adenocarcinoma  -ERCP on 04/29/2019 -Abdominal Pleurx catheter placement on 05/03/2019  Antimicrobials:   Levofloxacin 11/10 > 11/14, 11/19> 11/21   Subjective: -Nausea and poor appetite persist -Constipation concerns persist  Objective: Vitals:   05/01/19 0533 05/01/19 0538 05/01/19 0541 05/01/19 1328  BP:  118/84 (!) 107/58 115/80  Pulse: (!) 111 (!) 131 84 (!) 112  Resp:  18 18 20   Temp:  (!) 97.5 F (36.4 C) 98.1 F (36.7 C) 98.5 F (36.9 C)  TempSrc:  Oral Oral Oral  SpO2: 100% (!) 89% 97% 97%  Weight:      Height:        Intake/Output Summary (Last 24 hours) at 05/01/2019 1435 Last data filed at 05/01/2019 1243 Gross per 24 hour  Intake 1300 ml  Output 550 ml  Net 750 ml   Filed Weights   04/19/19 1521  Weight: 73.9 kg    Examination:   General exam: Alert, awake, oriented x 3 HEENT-  +ve sclera icterus Respiratory system: Diminished breath sounds at bases, no wheezing Cardiovascular system:RRR. No murmurs, rubs, gallops.  Left subclavian Port-A-Cath site is clean dry intact Gastrointestinal system: Abdomen is distended, soft with mild epigastric and right upper quadrant discomfort on palpation without rebound or guarding. +ve BS Central nervous system: Alert  and oriented. No focal neurological deficits. Extremities: 1+ edema bilaterally Skin: No rashes, lesions or ulcers Psychiatry: Judgement and insight appear normal. Mood  & affect is flat  Data Reviewed:  CBC: Recent Labs  Lab 04/28/19 0458 04/29/19 0524 04/30/19 0438  WBC 9.2 12.5* 10.0  NEUTROABS  --  10.8*  --   HGB 11.1* 12.0 11.1*  HCT 33.4* 35.7* 33.1*  MCV 80.1 79.3* 80.7  PLT 189 264 [id number]   Basic Metabolic Panel: Recent Labs  Lab 04/27/19 0553 04/28/19 0458 04/29/19 0524 04/30/19 0438  NA 134* 135 136 135  K 3.4* 3.5 3.8 4.0  CL 99 99 101 100  CO2 25 25 23 24   GLUCOSE 93 101* 93 132*  BUN 9 10 13 20   CREATININE 0.51 0.44 0.46 0.50  CALCIUM 8.7* 8.8* 8.8* 9.0   GFR: Estimated Creatinine Clearance: 84.7 mL/min (by C-G formula based on SCr of 0.5 mg/dL). Liver Function Tests: Recent Labs  Lab 04/26/19 0447 04/27/19 0553 04/28/19 0458 04/29/19 0524 04/30/19 0438  AST 124* 192* 209* 286* 324*  ALT 35 50* 58* 77* 91*  ALKPHOS 365* 462* 500* 629* 700*  BILITOT 5.5* 7.7* 9.3* 12.4* 13.1*  PROT 5.7* 5.7* 5.6* 6.1* 5.8*  ALBUMIN 3.3* 3.0* 2.7* 2.6* 2.4*   No results for input(s): LIPASE, AMYLASE in the last 168 hours. No results for input(s): AMMONIA in the last 168 hours. Coagulation Profile: Recent Labs  Lab 04/28/19 0458  INR 1.3*   Cardiac Enzymes: No results for input(s): CKTOTAL, CKMB, CKMBINDEX, TROPONINI in the last 168 hours. BNP (last 3 results) No results for input(s): PROBNP in the last 8760 hours. HbA1C: No results for input(s): HGBA1C in the last 72 hours. CBG: No results for input(s): GLUCAP in the last 168 hours. Lipid Profile: No results for input(s): CHOL, HDL, LDLCALC, TRIG, CHOLHDL, LDLDIRECT in the last 72 hours. Thyroid Function Tests: No results for input(s): TSH, T4TOTAL, FREET4, T3FREE, THYROIDAB in the last 72 hours. Anemia Panel: No results for input(s): VITAMINB12, FOLATE, FERRITIN, TIBC, IRON, RETICCTPCT in the last 72 hours.   Radiology Studies:  Scheduled Meds: . Chlorhexidine Gluconate Cloth  6 each Topical Daily  . dexamethasone  2 mg Oral Daily  . feeding supplement  1  Container Oral TID BM  . fentaNYL  1 patch Transdermal Q72H  . gabapentin  300 mg Oral QHS  . lactulose  30 g Oral Q8H  . linaclotide  290 mcg Oral QAC breakfast  . metoprolol tartrate  12.5 mg Oral BID  . mirtazapine  7.5 mg Oral QHS  . pantoprazole  40 mg Oral BID AC  . prochlorperazine  10 mg Intravenous Q6H  . promethazine  6.25 mg Intravenous Q4H   Continuous Infusions: . levofloxacin (LEVAQUIN) IV 500 mg (04/30/19 1658)     LOS: 12 days   Roxan Hockey, MD

## 2019-05-02 DIAGNOSIS — K7682 Hepatic encephalopathy: Secondary | ICD-10-CM | POA: Diagnosis present

## 2019-05-02 DIAGNOSIS — K7200 Acute and subacute hepatic failure without coma: Secondary | ICD-10-CM | POA: Diagnosis present

## 2019-05-02 LAB — CBC
HCT: 37.6 % (ref 36.0–46.0)
Hemoglobin: 12.5 g/dL (ref 12.0–15.0)
MCH: 26.9 pg (ref 26.0–34.0)
MCHC: 33.2 g/dL (ref 30.0–36.0)
MCV: 80.9 fL (ref 80.0–100.0)
Platelets: 224 10*3/uL (ref 150–400)
RBC: 4.65 MIL/uL (ref 3.87–5.11)
RDW: 25.9 % — ABNORMAL HIGH (ref 11.5–15.5)
WBC: 14.2 10*3/uL — ABNORMAL HIGH (ref 4.0–10.5)
nRBC: 0 % (ref 0.0–0.2)

## 2019-05-02 MED ORDER — RIFAXIMIN 550 MG PO TABS
550.0000 mg | ORAL_TABLET | Freq: Two times a day (BID) | ORAL | Status: DC
  Administered 2019-05-02 – 2019-05-03 (×4): 550 mg via ORAL
  Filled 2019-05-02 (×4): qty 1

## 2019-05-02 MED ORDER — DEXAMETHASONE 0.5 MG PO TABS
1.0000 mg | ORAL_TABLET | Freq: Every day | ORAL | Status: DC
  Administered 2019-05-03: 1 mg via ORAL
  Filled 2019-05-02 (×3): qty 2

## 2019-05-02 MED ORDER — PROMETHAZINE HCL 25 MG/ML IJ SOLN
6.2500 mg | Freq: Four times a day (QID) | INTRAMUSCULAR | Status: DC
  Administered 2019-05-02 – 2019-05-04 (×7): 6.25 mg via INTRAVENOUS
  Filled 2019-05-02 (×7): qty 1

## 2019-05-02 MED ORDER — LACTULOSE 10 GM/15ML PO SOLN
60.0000 g | Freq: Once | ORAL | Status: AC
  Administered 2019-05-02: 60 g via ORAL
  Filled 2019-05-02: qty 90

## 2019-05-02 NOTE — Progress Notes (Addendum)
   Assessment/Plan: ADMITTED WITH HEPATIC DECOMPENSATION AND NOW HAS EVIDENCE FOR ENCEPHALOPATHY.  PLAN: 1. STOP COMPAZINE. REDUCE PHENERGAN. 2. ADD XIFAXAN BID. LACTULOSE. CHECK AMMONIA WITH AM LABS. CONTINUE LACTULOSE. 3. DAUGHTER STILL NEEDS TO GIVE CONSENT FOR PLEURX CATHETER. 4. CMP ON NOV 23. 5. D/C TELEMETRY.   Subjective: Since I last evaluated the patient PT IS CONFUSED. NO NAUSEA CURRENTLY. C/O PRESSURE IN ABDOMEN. NO VOMITING OR ABDOMINAL PAIN. TOLERATING POs.  Objective: Vital signs in last 24 hours: Vitals:   05/02/19 1030 05/02/19 1052  BP:  (!) 108/59  Pulse: (!) 110 (!) 106  Resp:  20  Temp:  98 F (36.7 C)  SpO2:  95%   General appearance: alert, cooperative and no distress Resp: clear to auscultation bilaterally Cardio: regular rate and rhythm GI: soft, non-tender; DISTENDED, bowel sounds normal;   Lab Results:   Hb 12.5 PLT CT 224   Studies/Results: No results found.  Medications: I have reviewed the patient's current medications.

## 2019-05-02 NOTE — Progress Notes (Signed)
Patient with history of metastatic breast cancer, liver metastasis, recurrent ascites s/p paracentesis x2 this admission.  Request is made for PleurX catheter placement.  Plan is to place in IR on Monday, if possible.  Patient with advanced disease. Although she remains full code, her prognosis is poor.  Ongoing discussions between patient and Palliative care.  Unfortunately, her mental status is worsening and daughter will need to be involved in decision making process.   PA called and spoke with daughter, Oley Balm, at length.  Oley Balm states she has had very short, intermittent conversations with providers over the past 2 weeks.  She has spoken with her mom and understands that due to her confusion she cannot make her own decisions right now.  However, as her mom has been making decisions and participating in her care for the past 2 weeks, she seems unprepared for conversation today.   She appears to understand that her mom's illness is advanced/terminal, but also talks about continuing to fight as this has been her mother's wish.  Limited our discussion to the recurrent ascites and PleurX catheter and discussed risks/benefits of catheter placement regardless of her cancer treatment status.   Oley Balm is unable to make a decision re: catheter placement at this time.  She asks for time to think, time to talk to other family members, and a return call tomorrow.  She also would like to talk to medical team which seems most appropriate at this time.   Updated RN.  NPO p MN for *possible* catheter placement tomorrow assuming decision made to proceed.  Per RN, appears patient has reaccumulated fluid.  She is on telemetry and is largely somnolent.   Brynda Greathouse, MS RD PA-C

## 2019-05-02 NOTE — Progress Notes (Signed)
PROGRESS NOTE    P[facility]  [id number] DOB: 1971-01-02 DOA: 04/19/2019 PCP: Doree Albee, MD    Brief Narrative:  48 year old female who presented with cough and dyspnea.  She does have significant past medical history for metastatic breast cancer, depression and asthma.  She reported worsening productive cough, associated with difficulty breathing for about a week.  Positive pleuritic chest pain.  On her initial physical examination her heart rate was 119, oxygen saturation 89% on room air, blood pressure 131/86, respiratory rate 19.  Her lungs were clear to auscultation bilaterally, heart S1-S2 present, tachycardic, abdomen soft, trace lower extremity edema more left than right SARS COVID-19 negative.  Chest radiograph with faint infiltrate right upper lobe, atelectasis left lower lobe with small pleural effusion.   Patient was admitted to the hospital with the working diagnosis of acute hypoxic respiratory failure due to community-acquired pneumonia, present on admission.  She has been responding well to antibiotic therapy with levofloxacin. Underwent left pleural effusion thoracentesis with 980 fluid obtained.  -11/11 left thoracentesis with removal of 980 mL of fluid--fluid cytology from 04/21/2019 with malignant cells 11/17 paracentesis with removal of 3.4 L of fluid-fluid cytology from 04/27/2019 with - Malignant cells consistent with metastatic adenocarcinoma   Assessment & Plan:   Principal Problem:   Pneumonia Active Problems:   Asthma   Depression   Metastatic breast cancer ([place])   Pleural effusion   Respiratory distress   Ascites   Malignant pleural effusion   Cancer associated pain   Pulmonary infiltrate   Acute hepatic encephalopathy   1. Acute hypoxic respiratory failure due to right upper lobe community acquired pneumonia, complicated with left plural effusion. Patient complained of dyspnea and pleuritic chest pain, oximetry is 97% on 2 LPM per [place]. US  guided thoracentesis to the left with 980 ml of yellow fluid.  -11/11 left thoracentesis with removal of 980 mL of fluid--fluid cytology from 04/21/2019 with malignant cells 11/17 paracentesis with removal of 3.4 L of fluid-fluid cytology from 04/27/2019 with - Malignant cells consistent with metastatic adenocarcinoma  - right-sided pleural effusion was evaluated and was not felt to be significant enough to attempt thoracentesis.  It was noted that she has significant ascites and she has undergone multiple paracentesis which had improved her shortness of breath.  Pleural fluid cytology from 04/21/19 was positive for malignant cells.  2. Metastatic breast cancer/ liver metastais/elevated LFTs. On chemotherapy as outpatient, will follow as outpatient.  LFTs were elevated on admission.  She recently underwent stent placement by Dr. Laural Golden in 03/2019.  GI consulted, appreciate input.  Started on Reglan for nausea and vomiting.  MRCP showed the biliary stent may be partially obstructed by liver mets.  It does not appear to be completely obstructed.  LFTs currently trending up.  Dr. Laural Golden has discussed with the patient we will plan on ERCP on 11/19 for stent exchange.  Oncology is also following and will help develop further plan after procedure.   -Patient had ERCP on 04/29/2019 however unable to complete stent exchange---  LFTs continue to trend up -As per Dr Reesa Chew (IR) --- patient is not a candidate for percutaneous placement of biliary drain due to lack of significant biliary duct dilatation and small ducts   3.  Hepatic encephalopathy-patient with increasing confusion suspect multifactorial, suspect component of hepatic encephalopathy, treat with lactulose and rifaximin, -Also suspect medication related confusion, compression will be discontinued, decrease dose of Phenergan, -Check serum ammonia  4. HTN.  Blood pressure  stable on metoprolol, may use IV metoprolol as needed elevated HR  5. Asthma.  Stable with no signs of exacerbation.   6.  Ascites.  Likely related to underlying liver disease/hypoalbuminemia-in the setting of metastatic breast cancer -.  Requiring frequent paracentesis  --Plan is for abdominal Pleurx catheter placement at [facility] by IR on 05/03/2019 due to recurrent ascites -Recurrent ascites may also be related to underlying malignant metastatic disease.  Patient has undergone paracentesis x3 during her hospital stay.   --Ascitic fluid from 04/27/2019 with malignant cells consistent with metastatic adenocarcinoma   7)Social/Ethics--- palliative care consult appreciated, patient remains a full code -Fentanyl patch started 04/28/2019 -Overall prognosis is poor/grave -Pastoral/spiritual care counseling noted -Patient looking to apoint HCPOA and complete advanced Directives -Possible discharge home after abdominal Pleurx catheter placement with hospice services  8) intractable nausea/vomiting/anorexia--nausea persist, continue Phenergan, Decadron and Remeron -Around-the-clock Reglan discontinued, changed to as needed -Amitiza discontinued by GI service -Around-the-clock Compazine discontinued by GI service -Consider stopping Remeron if excessive lethargy persists -Palliative care input appreciated with regards to symptom management  10)Disposition-plan is for IR to place abdominal Pleurx catheter  to avoid recurrent paracentesis-- on Monday, 05/03/2019 at [facility]  -Patient is leaning towards enrolling in hospice once IR procedures are completed  DVT prophylaxis: enoxaparin   Code Status:  full Family Communication: no family at the bedside     Body mass index is 27.12 kg/m. Malnutrition Type:  Nutrition Problem: Severe Malnutrition Etiology: cancer and cancer related treatments(breast cancer with mets to liver; last chemotherapy 10/15)   Malnutrition Characteristics:  Signs/Symptoms: percent weight loss, moderate fat depletion, severe fat depletion,  moderate muscle depletion, severe muscle depletion, edema, energy intake < or equal to 75% for > or equal to 1 month, per patient/family report Percent weight loss: 12.6 %(23.5 lbs x 3 months)   Nutrition Interventions:  Interventions: Boost Breeze, Education  RN Pressure Injury Documentation:    Consultants:   Oncology  Interventional radiology   Gastroenterology  Palliative care  Procedures:   11/6 paracentesis with removal of 4.9 L of fluid  11/11 left thoracentesis with removal of 980 mL of fluid--fluid cytology from 04/21/2019 with malignant cells  11/13 paracentesis with removal of 4.7 L of fluid  11/17 paracentesis with removal of 3.4 L of fluid-fluid cytology from 04/27/2019 with - Malignant cells consistent with metastatic adenocarcinoma  -ERCP on 04/29/2019 -Abdominal Pleurx catheter placement on 05/03/2019  Antimicrobials:   Levofloxacin 11/10 > 11/14, 11/19> 11/21   Subjective:  -Increasing confusion and disorientation, -Unable to reach patient's daughter to try to get consent for Pleurx catheter placement  Objective: Vitals:   05/02/19 1030 05/02/19 1052 05/02/19 1251 05/02/19 1408  BP:  (!) 108/59  108/63  Pulse: (!) 110 (!) 106 (!) 104 (!) 105  Resp:  20  17  Temp:  98 F (36.7 C)  98.5 F (36.9 C)  TempSrc:  Oral    SpO2:  95%  92%  Weight:      Height:        Intake/Output Summary (Last 24 hours) at 05/02/2019 1740 Last data filed at 05/02/2019 0900 Gross per 24 hour  Intake 240 ml  Output 200 ml  Net 40 ml   Filed Weights   04/19/19 1521  Weight: 73.9 kg    Examination:   General exam: Confused and disoriented HEENT-  +ve sclera icterus Respiratory system: Diminished breath sounds at bases, no wheezing Cardiovascular system:RRR. No murmurs, rubs, gallops.  Left subclavian  Port-A-Cath site is clean dry intact Gastrointestinal system: Abdomen is distended, soft with mild epigastric and right upper quadrant discomfort on  palpation without rebound or guarding. +ve BS Central nervous system: Alert and oriented. No focal neurological deficits. Extremities: 1+ edema bilaterally Skin: No rashes, lesions or ulcers Psychiatry: Confused and disoriented, encephalopathic  Data Reviewed:  CBC: Recent Labs  Lab 04/28/19 0458 04/29/19 0524 04/30/19 0438 05/02/19 0847  WBC 9.2 12.5* 10.0 14.2*  NEUTROABS  --  10.8*  --   --   HGB 11.1* 12.0 11.1* 12.5  HCT 33.4* 35.7* 33.1* 37.6  MCV 80.1 79.3* 80.7 80.9  PLT 189 264 232 [id number]   Basic Metabolic Panel: Recent Labs  Lab 04/27/19 0553 04/28/19 0458 04/29/19 0524 04/30/19 0438  NA 134* 135 136 135  K 3.4* 3.5 3.8 4.0  CL 99 99 101 100  CO2 25 25 23 24   GLUCOSE 93 101* 93 132*  BUN 9 10 13 20   CREATININE 0.51 0.44 0.46 0.50  CALCIUM 8.7* 8.8* 8.8* 9.0   GFR: Estimated Creatinine Clearance: 84.7 mL/min (by C-G formula based on SCr of 0.5 mg/dL). Liver Function Tests: Recent Labs  Lab 04/26/19 0447 04/27/19 0553 04/28/19 0458 04/29/19 0524 04/30/19 0438  AST 124* 192* 209* 286* 324*  ALT 35 50* 58* 77* 91*  ALKPHOS 365* 462* 500* 629* 700*  BILITOT 5.5* 7.7* 9.3* 12.4* 13.1*  PROT 5.7* 5.7* 5.6* 6.1* 5.8*  ALBUMIN 3.3* 3.0* 2.7* 2.6* 2.4*   No results for input(s): LIPASE, AMYLASE in the last 168 hours. No results for input(s): AMMONIA in the last 168 hours. Coagulation Profile: Recent Labs  Lab 04/28/19 0458  INR 1.3*   Cardiac Enzymes: No results for input(s): CKTOTAL, CKMB, CKMBINDEX, TROPONINI in the last 168 hours. BNP (last 3 results) No results for input(s): PROBNP in the last 8760 hours. HbA1C: No results for input(s): HGBA1C in the last 72 hours. CBG: No results for input(s): GLUCAP in the last 168 hours. Lipid Profile: No results for input(s): CHOL, HDL, LDLCALC, TRIG, CHOLHDL, LDLDIRECT in the last 72 hours. Thyroid Function Tests: No results for input(s): TSH, T4TOTAL, FREET4, T3FREE, THYROIDAB in the last 72 hours.  Anemia Panel: No results for input(s): VITAMINB12, FOLATE, FERRITIN, TIBC, IRON, RETICCTPCT in the last 72 hours.   Radiology Studies:  Scheduled Meds: . Chlorhexidine Gluconate Cloth  6 each Topical Daily  . dexamethasone  2 mg Oral Daily  . feeding supplement  1 Container Oral TID BM  . fentaNYL  1 patch Transdermal Q72H  . gabapentin  300 mg Oral QHS  . lactulose  30 g Oral Q8H  . linaclotide  290 mcg Oral QAC breakfast  . metoprolol tartrate  12.5 mg Oral BID  . mirtazapine  7.5 mg Oral QHS  . pantoprazole  40 mg Oral BID AC  . promethazine  6.25 mg Intravenous Q6H  . rifaximin  550 mg Oral BID   Continuous Infusions:    LOS: 13 days   Roxan Hockey, MD

## 2019-05-02 NOTE — Progress Notes (Signed)
Patient has BP 108/59 and Heart rate of 106 making her MEWs score 2. MD aware of vitals

## 2019-05-03 ENCOUNTER — Encounter (HOSPITAL_COMMUNITY): Payer: Self-pay | Admitting: Internal Medicine

## 2019-05-03 DIAGNOSIS — Z66 Do not resuscitate: Secondary | ICD-10-CM

## 2019-05-03 LAB — CBC
HCT: 36.2 % (ref 36.0–46.0)
Hemoglobin: 12.4 g/dL (ref 12.0–15.0)
MCH: 27.2 pg (ref 26.0–34.0)
MCHC: 34.3 g/dL (ref 30.0–36.0)
MCV: 79.4 fL — ABNORMAL LOW (ref 80.0–100.0)
Platelets: 270 10*3/uL (ref 150–400)
RBC: 4.56 MIL/uL (ref 3.87–5.11)
RDW: 25.7 % — ABNORMAL HIGH (ref 11.5–15.5)
WBC: 15.3 10*3/uL — ABNORMAL HIGH (ref 4.0–10.5)
nRBC: 0 % (ref 0.0–0.2)

## 2019-05-03 LAB — URINALYSIS, ROUTINE W REFLEX MICROSCOPIC
Glucose, UA: NEGATIVE mg/dL
Hgb urine dipstick: NEGATIVE
Ketones, ur: NEGATIVE mg/dL
Leukocytes,Ua: NEGATIVE
Nitrite: NEGATIVE
Protein, ur: 30 mg/dL — AB
Specific Gravity, Urine: 1.027 (ref 1.005–1.030)
pH: 6 (ref 5.0–8.0)

## 2019-05-03 LAB — PROTIME-INR
INR: 1.8 — ABNORMAL HIGH (ref 0.8–1.2)
Prothrombin Time: 21 seconds — ABNORMAL HIGH (ref 11.4–15.2)

## 2019-05-03 LAB — AMMONIA: Ammonia: 51 umol/L — ABNORMAL HIGH (ref 9–35)

## 2019-05-03 LAB — COMPREHENSIVE METABOLIC PANEL
ALT: 144 U/L — ABNORMAL HIGH (ref 0–44)
AST: 365 U/L — ABNORMAL HIGH (ref 15–41)
Albumin: 2.4 g/dL — ABNORMAL LOW (ref 3.5–5.0)
Alkaline Phosphatase: 987 U/L — ABNORMAL HIGH (ref 38–126)
Anion gap: 13 (ref 5–15)
BUN: 29 mg/dL — ABNORMAL HIGH (ref 6–20)
CO2: 23 mmol/L (ref 22–32)
Calcium: 9.5 mg/dL (ref 8.9–10.3)
Chloride: 97 mmol/L — ABNORMAL LOW (ref 98–111)
Creatinine, Ser: 0.54 mg/dL (ref 0.44–1.00)
GFR calc Af Amer: >60 mL/min (ref >60)
GFR calc non Af Amer: >60 mL/min (ref >60)
Glucose, Bld: 116 mg/dL — ABNORMAL HIGH (ref 70–99)
Potassium: 3.4 mmol/L — ABNORMAL LOW (ref 3.5–5.1)
Sodium: 133 mmol/L — ABNORMAL LOW (ref 135–145)
Total Bilirubin: 19.8 mg/dL (ref 0.3–1.2)
Total Protein: 6.1 g/dL — ABNORMAL LOW (ref 6.5–8.1)

## 2019-05-03 MED ORDER — HALOPERIDOL LACTATE 5 MG/ML IJ SOLN
1.0000 mg | Freq: Four times a day (QID) | INTRAMUSCULAR | Status: DC | PRN
  Administered 2019-05-03 – 2019-05-04 (×3): 1 mg via INTRAVENOUS
  Filled 2019-05-03 (×3): qty 1

## 2019-05-03 MED ORDER — POTASSIUM CHLORIDE CRYS ER 20 MEQ PO TBCR
40.0000 meq | EXTENDED_RELEASE_TABLET | ORAL | Status: AC
  Administered 2019-05-03 (×2): 40 meq via ORAL
  Filled 2019-05-03 (×2): qty 2

## 2019-05-03 MED ORDER — LACTULOSE 10 GM/15ML PO SOLN
30.0000 g | Freq: Three times a day (TID) | ORAL | Status: DC
  Administered 2019-05-03: 30 g via ORAL
  Filled 2019-05-03: qty 60

## 2019-05-03 MED ORDER — LORAZEPAM 2 MG/ML IJ SOLN
0.5000 mg | INTRAMUSCULAR | Status: DC | PRN
  Administered 2019-05-03 – 2019-05-04 (×3): 1 mg via INTRAVENOUS
  Filled 2019-05-03 (×3): qty 1

## 2019-05-03 MED ORDER — MORPHINE SULFATE (PF) 2 MG/ML IV SOLN
1.0000 mg | INTRAVENOUS | Status: DC | PRN
  Administered 2019-05-03 – 2019-05-04 (×4): 2 mg via INTRAVENOUS
  Filled 2019-05-03 (×4): qty 1

## 2019-05-03 NOTE — Progress Notes (Signed)
PROGRESS NOTE    Pamela [facility]  [id number] DOB: 1971/05/05 DOA: 04/19/2019 PCP: Pamela Albee, MD    Brief Narrative:  48 year old female who presented with cough and dyspnea.  She does have significant past Pamela history for metastatic breast cancer, depression and asthma.  She reported worsening productive cough, associated with difficulty breathing for about a week.  Positive pleuritic chest pain.  On her initial physical examination her heart rate was 119, oxygen saturation 89% on room air, blood pressure 131/86, respiratory rate 19.  Her lungs were clear to auscultation bilaterally, heart S1-S2 present, tachycardic, abdomen soft, trace lower extremity edema more left than right SARS COVID-19 negative.  Chest radiograph with faint infiltrate right upper lobe, atelectasis left lower lobe with small pleural effusion.   [facility] with the working diagnosis of acute hypoxic respiratory failure due to community-acquired pneumonia, present on admission.  She has been responding well to antibiotic therapy with levofloxacin. Underwent left pleural effusion thoracentesis with 980 fluid obtained.  -11/11 left thoracentesis with removal of 980 mL of fluid--fluid cytology from 04/21/2019 with malignant cells 11/17 paracentesis with removal of 3.4 L of fluid-fluid cytology from 04/27/2019 with - Malignant cells consistent with metastatic adenocarcinoma   -Family conference with patient's daughter Pamela [facility] on 05/03/2019, chaplain present, palliative care practitioner [facility] also present -Patient is now DNR/DNI,  she is comfort cares -Plan is to transition her to hospice house  Assessment & Plan:   Principal Problem:   Metastatic breast cancer [facility]) Active Problems:   Ascites   Cancer associated pain   Acute hepatic encephalopathy   Asthma   Depression   Pleural effusion   Pneumonia   Respiratory distress   Malignant pleural effusion   Pulmonary  infiltrate   DNR (do not resuscitate)   1. Acute hypoxic respiratory failure due to right upper lobe community acquired pneumonia, complicated with left plural effusion. Patient complained of dyspnea and pleuritic chest pain, oximetry is 97% on 2 LPM per [facility]. US guided thoracentesis to the left with 980 ml of yellow fluid.  -11/11 left thoracentesis with removal of 980 mL of fluid--fluid cytology from 04/21/2019 with malignant cells 11/17 paracentesis with removal of 3.4 L of fluid-fluid cytology from 04/27/2019 with - Malignant cells consistent with metastatic adenocarcinoma  - right-sided pleural effusion was evaluated and was not felt to be significant enough to attempt thoracentesis.  It was noted that she has significant ascites and she has undergone multiple paracentesis which had improved her shortness of breath.  Pleural fluid cytology from 04/21/19 was positive for malignant cells. --Family conference with patient's daughter Pamela [facility] on 05/03/2019, chaplain present, palliative care practitioner [facility] also present -Patient is now DNR/DNI,  she is comfort cares -Plan is to transition her to hospice house  2. Metastatic breast cancer/ liver metastais/elevated LFTs. On chemotherapy as outpatient, will follow as outpatient.  LFTs were elevated on admission.  She recently underwent stent placement by Pamela [facility] in 03/2019.  Pamela consulted, appreciate input.  Started on Reglan for nausea and vomiting.  MRCP showed the biliary stent may be partially obstructed by liver mets.  It does not appear to be completely obstructed.  LFTs currently trending up.  P[facility] has discussed with the patient we will plan on ERCP on 11/19 for stent exchange.  Oncology is also following and will help develop further plan after procedure.   -Patient had ERCP on 04/29/2019 however unable to complete stent exchange---  LFTs  continue to trend up -As per Dr Reesa [facility] ([facility]) --- patient is not a candidate for percutaneous  placement of biliary drain due to lack of significant biliary duct dilatation and small ducts   3.  Hepatic encephalopathy-patient with increasing confusion suspect multifactorial, suspect component of hepatic encephalopathy, treat with lactulose and rifaximin, -Also suspect medication related confusion, complexity and was discontinued, decrease dose of Phenergan,   4. HTN.  Blood pressure stable on metoprolol, may use IV metoprolol as needed elevated HR  5. Asthma. Stable with no signs of exacerbation.   6.  Ascites.  Likely related to underlying liver disease/hypoalbuminemia-in the setting of metastatic breast cancer -.  Requiring frequent paracentesis  --Plan is for abdominal Pleurx catheter [facility] at [facility] on 05/03/2019 due to recurrent ascites -Recurrent ascites may also be related to underlying malignant metastatic disease.  Patient has undergone paracentesis x3 during her [facility] stay.   --Ascitic fluid from 04/27/2019 with malignant cells consistent with metastatic adenocarcinoma   7)Social/Ethics--- palliative care consult appreciated, patient remains a full code -Fentanyl patch started 04/28/2019 -Overall prognosis is poor/grave -Pastoral/spiritual care counseling noted -Patient looking to apoint HCPOA and complete advanced Directives -Possible discharge home after abdominal Pleurx catheter placement with hospice services  8) intractable nausea/vomiting/anorexia--nausea persist, continue Phenergan, Decadron and Remeron -Around-the-clock Reglan discontinued, changed to as needed -Amitiza discontinued by [facility] -Around-the-clock Compazine discontinued by [facility] -Consider stopping Remeron if excessive lethargy persists -Palliative care input appreciated with regards to symptom management  10)Disposition- -Family conference with patient's daughter [facility]ela [facility] on 05/03/2019, chaplain present, palliative care practitioner [facility] also present -Patient is  now DNR/DNI,  she is comfort cares -Plan is to transition her to hospice house  DVT prophylaxis: enoxaparin   Code Status:  full Family Communication:  Daughter-- Adrianna    Body mass index is 27.12 kg/m. Malnutrition Type:  Nutrition Problem: Severe Malnutrition Etiology: cancer and cancer related treatments(breast cancer with mets to liver; last chemotherapy 10/15)   Malnutrition Characteristics:  Signs/Symptoms: percent weight loss, moderate fat depletion, severe fat depletion, moderate muscle depletion, severe muscle depletion, edema, energy intake < or equal to 75% for > or equal to 1 month, per patient/family report Percent weight loss: 12.6 %(23.5 lbs x 3 months)   Nutrition Interventions:  Interventions: Boost Breeze, Education  RN Pressure Injury Documentation:    Consultants:   Oncology  Interventional radiology   Gastroenterology  Palliative care  Procedures:   11/6 paracentesis with removal of 4.9 L of fluid  11/11 left thoracentesis with removal of 980 mL of fluid--fluid cytology from 04/21/2019 with malignant cells  11/13 paracentesis with removal of 4.7 L of fluid  11/17 paracentesis with removal of 3.4 L of fluid-fluid cytology from 04/27/2019 with - Malignant cells consistent with metastatic adenocarcinoma  -ERCP on 04/29/2019 -Abdominal Pleurx catheter placement on 05/03/2019  Antimicrobials:   Levofloxacin 11/10 > 11/14, 11/19> 11/21   Subjective:  -Family conference with patient's daughter Pamela [facility] on 05/03/2019, chaplain present, palliative care practitioner [facility] also present -Patient is now DNR/DNI,  she is comfort cares -Plan is to transition her to hospice house  Objective: Vitals:   05/03/19 0558 05/03/19 0648 05/03/19 1100 05/03/19 1400  BP: 118/63 104/72 100/76 102/75  Pulse: (!) 111 (!) 114 (!) 112 (!) 102  Resp: 12 15 18 18   Temp: 98.8 F (37.1 C) 98.8 F (37.1 C)  98.2 F (36.8 C)  TempSrc:  Axillary     SpO2: 95% 98% 95% 93%  Weight:      Height:        Intake/Output Summary (Last 24 hours) at 05/03/2019 1903 Last data filed at 05/03/2019 [id number] Gross per 24 hour  Intake 200 ml  Output -  Net 200 ml   Filed Weights   04/19/19 1521  Weight: 73.9 kg    Examination:   General exam: Confused and disoriented, lethargic HEENT-  +ve sclera icterus Respiratory system: Diminished breath sounds at bases, no wheezing Cardiovascular system:RRR. No murmurs, rubs, gallops.  Left subclavian Port-A-Cath site is clean dry intact Gastrointestinal system: Abdomen is distended, soft with mild epigastric and right upper quadrant discomfort on palpation without rebound or guarding. +ve BS Central nervous system: Sleepy no focal neurological deficits. Extremities: 1+ edema bilaterally Skin: No rashes, lesions or ulcers Psychiatry: Confused and disoriented, lethargic encephalopathic  Data Reviewed:  CBC: Recent Labs  Lab 04/28/19 0458 04/29/19 0524 04/30/19 0438 05/02/19 0847 05/03/19 0602  WBC 9.2 12.5* 10.0 14.2* 15.3*  NEUTROABS  --  10.8*  --   --   --   HGB 11.1* 12.0 11.1* 12.5 12.4  HCT 33.4* 35.7* 33.1* 37.6 36.2  MCV 80.1 79.3* 80.7 80.9 79.4*  PLT 189 264 232 224 [id number]   Basic Metabolic Panel: Recent Labs  Lab 04/27/19 0553 04/28/19 0458 04/29/19 0524 04/30/19 0438 05/03/19 0602  NA 134* 135 136 135 133*  K 3.4* 3.5 3.8 4.0 3.4*  CL 99 99 101 100 97*  CO2 25 25 23 24 23   GLUCOSE 93 101* 93 132* 116*  BUN 9 10 13 20  29*  CREATININE 0.51 0.44 0.46 0.50 0.54  CALCIUM 8.7* 8.8* 8.8* 9.0 9.5   GFR: Estimated Creatinine Clearance: 84.7 mL/min (by C-G formula based on SCr of 0.54 mg/dL). Liver Function Tests: Recent Labs  Lab 04/27/19 0553 04/28/19 0458 04/29/19 0524 04/30/19 0438 05/03/19 0602  AST 192* 209* 286* 324* 365*  ALT 50* 58* 77* 91* 144*  ALKPHOS 462* 500* 629* 700* 987*  BILITOT 7.7* 9.3* 12.4* 13.1* 19.8*  PROT 5.7* 5.6* 6.1* 5.8* 6.1*  ALBUMIN 3.0*  2.7* 2.6* 2.4* 2.4*   No results for input(s): LIPASE, AMYLASE in the last 168 hours. Recent Labs  Lab 05/03/19 0602  AMMONIA 51*   Coagulation Profile: Recent Labs  Lab 04/28/19 0458 05/03/19 0611  INR 1.3* 1.8*   Cardiac Enzymes: No results for input(s): CKTOTAL, CKMB, CKMBINDEX, TROPONINI in the last 168 hours. BNP (last 3 results) No results for input(s): PROBNP in the last 8760 hours. HbA1C: No results for input(s): HGBA1C in the last 72 hours. CBG: No results for input(s): GLUCAP in the last 168 hours. Lipid Profile: No results for input(s): CHOL, HDL, LDLCALC, TRIG, CHOLHDL, LDLDIRECT in the last 72 hours. Thyroid Function Tests: No results for input(s): TSH, T4TOTAL, FREET4, T3FREE, THYROIDAB in the last 72 hours. Anemia Panel: No results for input(s): VITAMINB12, FOLATE, FERRITIN, TIBC, IRON, RETICCTPCT in the last 72 hours.   Radiology Studies:  Scheduled Meds: . Chlorhexidine Gluconate Cloth  6 each Topical Daily  . dexamethasone  1 mg Oral Daily  . feeding supplement  1 Container Oral TID BM  . fentaNYL  1 patch Transdermal Q72H  . gabapentin  300 mg Oral QHS  . lactulose  30 g Oral Q8H  . linaclotide  290 mcg Oral QAC breakfast  . metoprolol tartrate  12.5 mg Oral BID  . pantoprazole  40 mg Oral BID AC  . promethazine  6.25 mg Intravenous Q6H  .  rifaximin  550 mg Oral BID   Continuous Infusions:   -Family conference with patient's daughter [facility]ela [facility] on 05/03/2019, chaplain present, palliative care practitioner [facility] also present -Patient is now DNR/DNI,  she is comfort cares -Plan is to transition her to hospice house   LOS: 14 days   Roxan Hockey, MD

## 2019-05-03 NOTE — Progress Notes (Signed)
    Subjective: Per nursing staff, still confused but improved from yesterday. She is able to tell me the year but not the date. She states her daughter is sad and afraid of death. She wants to know when it's time to move towards all palliative measures. Wants to be home with her daughter. Understands the procedure is tomorrow instead of today.   Objective: Vital signs in last 24 hours: Temp:  [97.4 F (36.3 C)-98.8 F (37.1 C)] 98.8 F (37.1 C) (11/23 0648) Pulse Rate:  [18-114] 112 (11/23 1100) Resp:  [12-18] 18 (11/23 1100) BP: (100-118)/(60-88) 100/76 (11/23 1100) SpO2:  [92 %-98 %] 95 % (11/23 1100) Last BM Date: 05/03/19 General:   Drowsy but maintains conversation. oriented to person and place. Chronically-ill. No distress.  Abdomen:  Bowel sounds present, moderately tense ascites, no rebound or guarding, no TTP Extremities:  Without edema. Psych:  Flat affect  Intake/Output from previous day: 11/22 0701 - 11/23 0700 In: 320 [P.O.:320] Out: -  Intake/Output this shift: No intake/output data recorded.  Lab Results: Recent Labs    05/02/19 0847 05/03/19 0602  WBC 14.2* 15.3*  HGB 12.5 12.4  HCT 37.6 36.2  PLT 224 270   BMET Recent Labs    05/03/19 0602  NA 133*  K 3.4*  CL 97*  CO2 23  GLUCOSE 116*  BUN 29*  CREATININE 0.54  CALCIUM 9.5   LFT Recent Labs    05/03/19 0602  PROT 6.1*  ALBUMIN 2.4*  AST 365*  ALT 144*  ALKPHOS 987*  BILITOT 19.8*   PT/INR Recent Labs    05/03/19 0611  LABPROT 21.0*  INR 1.8*    Assessment: 48 year old female with history of metastatic breast cancer to liver, admitted with acute hypoxic respiratory failure in setting of pneumonia. 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 Hepatic decompensation with encephalopathy over weekend and some improvement today per nursing staff. She is able to hold brief conversations although drowsy, reporting she is concerned about her daughter and  interested in discussing further plans with palliative care. Worsening LFTs. Ammonia slightly elevated at 51 today. Plans for PleurX catheter placement on 11/24. IR has spoken with daughter, who is agreeable to proceed.   Plan: Continue Xifaxan and Lactulose, titrating lactulose to 3 soft BMs daily PPI BID Supportive care PleurX placement tentatively planned for 11/24 Soft diet, NPO after midnight Appreciate palliative involvement    Annitta Needs, PhD, ANP-BC [facility]     LOS: 14 days    05/03/2019, 11:49 AM

## 2019-05-03 NOTE — Progress Notes (Signed)
Patient confused, agitated,restless, and pulling at IV lines.  Patient moved from room 305 to room 316 for safety reason.

## 2019-05-03 NOTE — Progress Notes (Signed)
Palliative:  HPI: 48 y.o.femalewith past medical history of metastatic breast cancer, asthma, depression, anxietyadmitted on11/9/2020with SOB, cough d/t community acquired pneumonia.S/P left thoracentesis 04/21/19 cytology positive for malignant cells. Chemotherapy on hold s/t elevated LFTs last given 02/25/19 but continued progression on therapy. Now with worsening ascites and GI consulting forlikely ERCP andpotential stenting since bilirubin continues to increase. S/P paracentesis 04/23/19 4.7L and 04/16/19 4.9L.  Has had worsening confusion over the weekend and continues to decline with worsening liver function and climbing bilirubin.   I met today again with Selena Batten along with chaplain, Mardene Celeste. Selena Batten is very confused and unable to hold conversation. She was not even able to sign into her own cell phone to give [place] a phone number. She is picking at the air and mumbling inappropriate responses.   Dr. Denton Brick, Mardene Celeste, and myself called and spoke with daughter, Oley Balm. Dr. Denton Brick explained the medical condition and then I discussed with Adrianna our concerns and recommendations for how to move forward. Adrianna understands and has a good idea of what her mother would desires and agrees her mother would desire DNR. We also discussed focus on comfort and transition to hospice facility. Adrianna agrees that this would be best for her mother. We discussed poor prognosis as < 1-2 weeks. We also discussed placement of peritoneal drain and have decided that we can cancel this given our goal for comfort care and will use medications to ensure comfort with assistance of hospice. Oley Balm asks very good questions and has been speaking with family friend, Marge Duncans [phone]), and Oley Balm gives me permission to call and speak with Enid Derry.   I called and spoke with Enid Derry (who knows [place] and Adrianna from when they lives in [place]) and she confirms her conversation with Adrianna. Enid Derry has been  coaching Adrianna through this and discussed hospice and comfort care with Adrianna yesterday as well. She is no local but is committed to supporting Adrianna through this process as much as possible. Enid Derry also confirms that Adrianna's work colleagues are very supportive.   All questions/concerns addressed. Emotional support provided.   Exam: Alternating alertness and lethargy. Confused. Restless at times. Pallor. No distress. Breathing regular, irregular. Abd distended, non-tender. Generalized weakness.   Plan: - DNR - Comfort care.  - No plans for peritoneal drain placement.  - Transition to hospice facility. Daughter will greatly benefit from hospice support and counseling.   1400-1500, [id number] 80 min  Vinie Sill, NP Palliative Medicine Team Pager [phone] (Please see [email or web address] for schedule) Team Phone [phone]    Greater than 50%  of this time was spent counseling and coordinating care related to the above assessment and plan

## 2019-05-03 NOTE — Progress Notes (Signed)
PortaCath reaccessed using time out and sterile technique. Blood return noted. Flushes well. Pt RN notified. Sterile pressure caps put on line. Dressing in place.

## 2019-05-03 NOTE — Progress Notes (Signed)
Patient's BP 100/76 and Pulse 114 giving her a MEWS score of 3, MD aware

## 2019-05-03 NOTE — Progress Notes (Signed)
Patient agitated, has removed port a cath access several times, attempts to get out of bed  without assistance and still is confused.

## 2019-05-03 NOTE — Progress Notes (Addendum)
Patient ID: Pamela Long, female   DOB: 29-Nov-1970, 48 y.o.   MRN: [id number]   Pt is tentatively scheduled for Peritoneal PleurX drain catheter placement In IR at [facility] tomorrow  Orders in place to have pt to [facility] 11/24 12N RN aware  Covid Neg 04/19/19-- has been IP since test INR 1.8 today Will recheck in am  I have spoken with Loann Quill (daughter) via phone She is agreeable to proceed

## 2019-05-04 ENCOUNTER — Inpatient Hospital Stay (HOSPITAL_COMMUNITY)
Admission: EM | Admit: 2019-05-04 | Discharge: 2019-05-05 | DRG: 951 | Disposition: A | Discharge status: Hospice | Source: Hospice | Attending: Family Medicine | Admitting: Family Medicine

## 2019-05-04 DIAGNOSIS — Z9013 Acquired absence of bilateral breasts and nipples: Secondary | ICD-10-CM

## 2019-05-04 DIAGNOSIS — C50919 Malignant neoplasm of unspecified site of unspecified female breast: Secondary | ICD-10-CM | POA: Diagnosis not present

## 2019-05-04 DIAGNOSIS — J9601 Acute respiratory failure with hypoxia: Secondary | ICD-10-CM | POA: Diagnosis present

## 2019-05-04 DIAGNOSIS — J189 Pneumonia, unspecified organism: Secondary | ICD-10-CM | POA: Diagnosis present

## 2019-05-04 DIAGNOSIS — Z170 Estrogen receptor positive status [ER+]: Secondary | ICD-10-CM

## 2019-05-04 DIAGNOSIS — C50112 Malignant neoplasm of central portion of left female breast: Secondary | ICD-10-CM | POA: Diagnosis present

## 2019-05-04 DIAGNOSIS — Z66 Do not resuscitate: Secondary | ICD-10-CM | POA: Diagnosis present

## 2019-05-04 DIAGNOSIS — Z20828 Contact with and (suspected) exposure to other viral communicable diseases: Secondary | ICD-10-CM | POA: Diagnosis present

## 2019-05-04 DIAGNOSIS — R180 Malignant ascites: Secondary | ICD-10-CM | POA: Diagnosis present

## 2019-05-04 DIAGNOSIS — Z515 Encounter for palliative care: Principal | ICD-10-CM

## 2019-05-04 DIAGNOSIS — I10 Essential (primary) hypertension: Secondary | ICD-10-CM | POA: Diagnosis present

## 2019-05-04 DIAGNOSIS — G893 Neoplasm related pain (acute) (chronic): Secondary | ICD-10-CM | POA: Diagnosis present

## 2019-05-04 DIAGNOSIS — K7200 Acute and subacute hepatic failure without coma: Secondary | ICD-10-CM | POA: Diagnosis present

## 2019-05-04 DIAGNOSIS — N3281 Overactive bladder: Secondary | ICD-10-CM | POA: Diagnosis present

## 2019-05-04 DIAGNOSIS — F329 Major depressive disorder, single episode, unspecified: Secondary | ICD-10-CM | POA: Diagnosis present

## 2019-05-04 DIAGNOSIS — J910 Malignant pleural effusion: Secondary | ICD-10-CM | POA: Diagnosis present

## 2019-05-04 DIAGNOSIS — G43909 Migraine, unspecified, not intractable, without status migrainosus: Secondary | ICD-10-CM | POA: Diagnosis present

## 2019-05-04 DIAGNOSIS — K7682 Hepatic encephalopathy: Secondary | ICD-10-CM | POA: Diagnosis present

## 2019-05-04 DIAGNOSIS — J45909 Unspecified asthma, uncomplicated: Secondary | ICD-10-CM | POA: Diagnosis present

## 2019-05-04 DIAGNOSIS — C787 Secondary malignant neoplasm of liver and intrahepatic bile duct: Secondary | ICD-10-CM | POA: Diagnosis present

## 2019-05-04 DIAGNOSIS — K219 Gastro-esophageal reflux disease without esophagitis: Secondary | ICD-10-CM | POA: Diagnosis present

## 2019-05-04 MED ORDER — GLYCOPYRROLATE 0.2 MG/ML IJ SOLN: 0.2000 mg | INTRAMUSCULAR | Status: DC | PRN

## 2019-05-04 MED ORDER — ACETAMINOPHEN 650 MG RE SUPP: 650.0000 mg | Freq: Four times a day (QID) | RECTAL | Status: DC | PRN

## 2019-05-04 MED ORDER — DIPHENHYDRAMINE HCL 50 MG/ML IJ SOLN: 12.5000 mg | INTRAMUSCULAR | Status: DC | PRN

## 2019-05-04 MED ORDER — GLYCOPYRROLATE 1 MG PO TABS: 1.0000 mg | ORAL_TABLET | ORAL | Status: DC | PRN

## 2019-05-04 MED ORDER — ONDANSETRON HCL 4 MG/2ML IJ SOLN: 4.0000 mg | Freq: Four times a day (QID) | INTRAMUSCULAR | Status: DC | PRN

## 2019-05-04 MED ORDER — SODIUM CHLORIDE 0.9 % IV SOLN: 250.0000 mL | INTRAVENOUS | Status: DC | PRN

## 2019-05-04 MED ORDER — LORAZEPAM 1 MG PO TABS: 1.0000 mg | ORAL_TABLET | ORAL | Status: DC | PRN

## 2019-05-04 MED ORDER — POLYVINYL ALCOHOL 1.4 % OP SOLN: 1.0000 [drp] | Freq: Four times a day (QID) | OPHTHALMIC | Status: DC | PRN

## 2019-05-04 MED ORDER — SODIUM CHLORIDE 0.9% FLUSH: 3.0000 mL | INTRAVENOUS | Status: DC | PRN

## 2019-05-04 MED ORDER — OLANZAPINE 5 MG PO TBDP
5.0000 mg | ORAL_TABLET | Freq: Every day | ORAL | Status: DC
  Filled 2019-05-04 (×4): qty 1

## 2019-05-04 MED ORDER — SODIUM CHLORIDE 0.9% FLUSH
3.0000 mL | Freq: Two times a day (BID) | INTRAVENOUS | Status: DC
  Administered 2019-05-04 – 2019-05-05 (×3): 3 mL via INTRAVENOUS

## 2019-05-04 MED ORDER — BISACODYL 10 MG RE SUPP: 10.0000 mg | Freq: Every day | RECTAL | Status: DC | PRN

## 2019-05-04 MED ORDER — LORAZEPAM 2 MG/ML IJ SOLN
1.0000 mg | INTRAMUSCULAR | Status: DC | PRN
  Administered 2019-05-05: 1 mg via INTRAVENOUS
  Filled 2019-05-04 (×2): qty 1

## 2019-05-04 MED ORDER — SODIUM CHLORIDE 0.9 % IV SOLN: 12.5000 mg | Freq: Four times a day (QID) | INTRAVENOUS | Status: DC | PRN

## 2019-05-04 MED ORDER — HYDROMORPHONE HCL 1 MG/ML IJ SOLN
0.5000 mg | INTRAMUSCULAR | Status: DC | PRN
  Administered 2019-05-04: 1 mg via INTRAVENOUS
  Filled 2019-05-04: qty 1

## 2019-05-04 MED ORDER — LORAZEPAM 2 MG/ML IJ SOLN
1.0000 mg | INTRAMUSCULAR | Status: DC | PRN
  Administered 2019-05-04: 1 mg via INTRAVENOUS

## 2019-05-04 MED ORDER — ACETAMINOPHEN 325 MG PO TABS: 650.0000 mg | ORAL_TABLET | Freq: Four times a day (QID) | ORAL | Status: DC | PRN

## 2019-05-04 MED ORDER — GLYCOPYRROLATE 0.2 MG/ML IJ SOLN
0.2000 mg | INTRAMUSCULAR | Status: DC | PRN
  Administered 2019-05-05: 0.2 mg via INTRAVENOUS
  Filled 2019-05-04: qty 1

## 2019-05-04 MED ORDER — ONDANSETRON 4 MG PO TBDP: 4.0000 mg | ORAL_TABLET | Freq: Four times a day (QID) | ORAL | Status: DC | PRN

## 2019-05-04 MED ORDER — MORPHINE SULFATE (PF) 2 MG/ML IV SOLN
2.0000 mg | INTRAVENOUS | Status: DC | PRN
  Administered 2019-05-04 – 2019-05-05 (×4): 2 mg via INTRAVENOUS
  Filled 2019-05-04 (×5): qty 1

## 2019-05-04 MED ORDER — ALBUTEROL SULFATE (2.5 MG/3ML) 0.083% IN NEBU: 2.5000 mg | INHALATION_SOLUTION | RESPIRATORY_TRACT | Status: DC | PRN

## 2019-05-04 MED ORDER — BIOTENE DRY MOUTH MT LIQD: 15.0000 mL | OROMUCOSAL | Status: DC | PRN

## 2019-05-04 MED ORDER — LORAZEPAM 2 MG/ML PO CONC: 1.0000 mg | ORAL | Status: DC | PRN

## 2019-05-04 MED ORDER — HALOPERIDOL LACTATE 5 MG/ML IJ SOLN
4.0000 mg | Freq: Four times a day (QID) | INTRAMUSCULAR | Status: DC | PRN
  Administered 2019-05-04: 4 mg via INTRAVENOUS
  Filled 2019-05-04: qty 1

## 2019-05-04 NOTE — Progress Notes (Signed)
Palliative:  HPI: 48 y.o.femalewith past medical history of metastatic breast cancer, asthma, depression, anxietyadmitted on11/9/2020with SOB, cough d/t community acquired pneumonia.S/P left thoracentesis 04/21/19 cytology positive for malignant cells. Chemotherapy on hold s/t elevated LFTs last given 02/25/19 but continued progression on therapy. Now with worsening ascites and GI consulting forlikely ERCP andpotential stenting since bilirubin continues to increase. S/P paracentesis 04/23/19 4.7L and 04/16/19 4.9L. Has had worsening confusion over the weekend and continues to decline with worsening liver function and climbing bilirubin. Progressing at end of life.   I met today at [facility] bedside but she is mostly unresponsive except for moaning and restlessness. She is unable to communicate with me. I adjusted medications to better ensure comfort.  I called and spoke with daughter, Oley Balm. Updated on her mother's condition. We are awaiting hospice bed. Spent time speaking with Adrianna answering her questions and providing emotional support. She is coming to hospital to complete hospice paperwork and I she plans to bring a friend for support which I encouraged.   I also called and spoke with family friend, Enid Derry. Enid Derry is coaching Adrianna through this process so I updated her on Becca's decline towards EOL and hospice involvement. Enid Derry plans to continue working with Oley Balm and will begin to discuss funeral arrangements with her soon as well.   All questions/concerns addressed.   Exam: Mostly unresponsive. Pale/jaundice. Restless. Breathing unlabored. Abd distended. Generalized weakness.   Plan: - Plan for hospice GIP until bed available at hospice facility.  - Emotional support to daughter. Chaplain also following for support.  - Medications liberalized further to ensure comfort.   [place], NP Palliative Medicine Team Pager [phone] (Please see [email or web address]  for schedule) Team Phone [phone]    Greater than 50%  of this time was spent counseling and coordinating care related to the above assessment and plan

## 2019-05-04 NOTE — TOC Progression Note (Signed)
Transition of Care [facility]) - Progression Note    Patient Details  Name: Pamela Long MRN: [id number] Date of Birth: Aug 29, 1970  Transition [facility]) CM/SW Contact  Boneta Lucks, RN Phone Number: 05/04/2019, 2:22 PM  Clinical Narrative:   Patient in now comfort care, daughter requesting Hospice Home.  Called [facility], they do not have any beds. They are sending a nurse out to [place] to assess.  MD aware to make patient GIP and order repeat COVID test so patient will be ready for transport when they have a bed available. Daughter is coming at 3:30 to meet with Hospice nurse.     Expected Discharge Plan: [place] Barriers to Discharge: Barriers Resolved  Expected Discharge Plan and Services Expected Discharge Plan: [place] In-house Referral: Clinical Social Work     Living arrangements for the past 2 months: Single Family Home Expected Discharge Date: 05/04/19                   Readmission Risk Interventions Readmission Risk Prevention Plan 04/28/2019 04/20/2019 02/01/2019  Transportation Screening - Complete Complete  PCP or Specialist Appt within 3-5 Days - - -  HRI or [place] Work Consult for [place] Planning/Counseling - - -  [place] - - -  Medication Review Press photographer) - Complete Complete  PCP or Specialist appointment within 3-5 days of discharge - - Not Complete  HRI or [place] - - Complete  HRI or Home Care Consult Pt Refusal Comments - - -  SW Recovery Care/Counseling Consult - - Complete  Palliative Care Screening Complete - Not Complete  Skilled Nursing Facility - Not Applicable Not Complete

## 2019-05-04 NOTE — H&P (Signed)
   Please see full discharge note for same date of service -Patient discharged from hospital service on 05/04/2019  -Patient readmitted to hospice/palliative service on 05/04/2019  Roxan Hockey, MD

## 2019-05-04 NOTE — Progress Notes (Signed)
Notified daughter of patient room transfer.

## 2019-05-04 NOTE — Discharge Summary (Addendum)
Pamela Long, is a 48 y.o. female  DOB Dec 06, 1970  MRN [id number].  Admission date:  04/19/2019  Admitting Physician  Bethena Roys, MD  Discharge Date:  05/04/2019   Primary MD  Doree Albee, MD  Recommendations for primary care physician for things to follow:   Transfer to hospice house with comfort measures only  Admission Diagnosis  Hypokalemia [E87.6] Hyponatremia [E87.1] Pulmonary infiltrate [R91.8] Respiratory distress [R06.03]   Discharge Diagnosis  Hypokalemia [E87.6] Hyponatremia [E87.1] Pulmonary infiltrate [R91.8] Respiratory distress [R06.03]    Principal Problem:   Metastatic breast cancer ([place]) Active Problems:   Ascites   Cancer associated pain   Acute hepatic encephalopathy   Asthma   Depression   Pleural effusion   Pneumonia   Respiratory distress   Malignant pleural effusion   Pulmonary infiltrate   DNR (do not resuscitate)      Past Medical History:  Diagnosis Date   Anemia    Anxiety    Asthma    Breast cancer ([place])    Left, 0000000   Complication of anesthesia    Depression    GERD (gastroesophageal reflux disease)    Migraine    OAB (overactive bladder)    PONV (postoperative nausea and vomiting)    Seasonal allergies     Past Surgical History:  Procedure Laterality Date   ABDOMINAL HYSTERECTOMY     breast cancer   ANKLE RECONSTRUCTION Right 1989   BILIARY STENT PLACEMENT N/A 09/22/2018   Procedure: BILIARY STENT PLACEMENT;  Surgeon: Rogene Houston, MD;  [facility];  Service: Endoscopy;  Laterality: N/A;   BILIARY STENT PLACEMENT N/A 03/16/2019   Procedure: BILIARY STENT EXCHANGE;  Surgeon: Rogene Houston, MD;  Location: [facility];  Service: Endoscopy;  Laterality: N/A;   BILIARY STENT PLACEMENT N/A 04/29/2019   Procedure: ATTEMPTED BILIARY STENT PLACEMENT;  Surgeon: Rogene Houston, MD;  [facility]  [facility];  Service: Endoscopy;  Laterality: N/A;   BREAST SURGERY     COLONOSCOPY WITH PROPOFOL N/A 08/06/2018   Dr. Oneida Alar: small internal hemorrhoids, moderate external hemorrhoids. rectal bleeding due to ?anal fissure vs hemorrhoids. nex tcs 10 years   ERCP N/A 09/22/2018   Procedure: ENDOSCOPIC RETROGRADE CHOLANGIOPANCREATOGRAPHY (ERCP);  Surgeon: Rogene Houston, MD;  [facility];  Service: Endoscopy;  Laterality: N/A;   ERCP N/A 03/16/2019   Procedure: ENDOSCOPIC RETROGRADE CHOLANGIOPANCREATOGRAPHY (ERCP);  Surgeon: Rogene Houston, MD;  [facility];  Service: Endoscopy;  Laterality: N/A;   ERCP N/A 04/29/2019   Procedure: ENDOSCOPIC RETROGRADE CHOLANGIOPANCREATOGRAPHY (ERCP);  Surgeon: Rogene Houston, MD;  [facility];  Service: Endoscopy;  Laterality: N/A;  To be done at 1:30pm in OR 4   ESOPHAGOGASTRODUODENOSCOPY (EGD) WITH PROPOFOL N/A 04/29/2019   Procedure: ESOPHAGOGASTRODUODENOSCOPY (EGD) WITH PROPOFOL;  Surgeon: Rogene Houston, MD;  [facility];  Service: Endoscopy;  Laterality: N/A;   FOREIGN BODY REMOVAL N/A 08/29/2017   from lip   KNEE SURGERY Right  1989   bone spur   MASTECTOMY  2017   bil mastectomies   PORTACATH PLACEMENT Left 12/23/2018   Procedure: INSERTION PORT-A-CATH LEFT SUBCLAVIAN;  Surgeon: Aviva Signs, MD;  Location: [facility];  Service: General;  Laterality: Left;   SINUSOTOMY         HPI  from the history and physical done on the day of admission:    - Chief Complaint: Shortness of breath, cough  HPI: Pamela Long is a 48 y.o. female with medical history significant for metastatic breast cancer, depression, asthma.  Patient presented to the ED with complaints of worsening cough productive of whitish sputum and difficulty breathing over the past 1 week.  She reports persistent pleuritic chest pain from recent hospitalization and is worse with coughing and difficulty breathing.  No known  Covid positive contacts.  Chronic mild unchanged lower extremity swelling slightly worse on the left.  Reports poor p.o. intake with nausea and occasional vomiting.  No loose stools.  Recent hospitalization 10/31 - 11/1, for intractable nausea and vomiting with generalized weakness, thought secondary to chemo treatment and ongoing metastatic malignancy.  Had CTA chest done for pleuritic chest discomfort and tachycardia which was negative for pulmonary embolism.  ED Course: Tachycardic to 119, O2 sats 89% on room air.  Covid test ordered and pending.  WBC 10.1, potassium 2.9.  Portable chest x-ray ongoing bilateral pleural effusion suspected, with reduced pulmonary vascularity, patchy right upper lobe opacity possibly pulmonary infection.  IV Levaquin, IV Lasix 40 mg x 1.  Hospitalist to admit for further management.    Hospital Course:     Brief Narrative:  48 year old female who presented with cough and dyspnea.  She does have significant past medical history for metastatic breast cancer, depression and asthma.  She reported worsening productive cough, associated with difficulty breathing for about a week.  Positive pleuritic chest pain.  On her initial physical examination her heart rate was 119, oxygen saturation 89% on room air, blood pressure 131/86, respiratory rate 19.  Her lungs were clear to auscultation bilaterally, heart S1-S2 present, tachycardic, abdomen soft, trace lower extremity edema more left than right SARS COVID-19 negative.  Chest radiograph with faint infiltrate right upper lobe, atelectasis left lower lobe with small pleural effusion.   Patient was admitted to the hospital with the working diagnosis of acute hypoxic respiratory failure due to community-acquired pneumonia, present on admission.  She has been responding well to antibiotic therapy with levofloxacin. Underwent left pleural effusion thoracentesis with 980 fluid obtained.  -11/11 left thoracentesis with removal  of 980 mL of fluid--fluid cytology from 04/21/2019 with malignant cells 11/17 paracentesis with removal of 3.4 L of fluid-fluid cytology from 04/27/2019 with - Malignant cells consistent with metastatic adenocarcinoma   -Family conference with patient's daughter [name] on 05/03/2019, chaplain present, palliative care practitioner Vinie Sill also present -Patient is now DNR/DNI,  she is comfort cares -Plan is to transition her to hospice house   A/p 1. Acute hypoxic respiratory failure due to right upper lobe community acquired pneumonia, complicated with left plural effusion. Patient complained of dyspnea and pleuritic chest pain, oximetry is 97% on 2 LPM per . US guided thoracentesis to the left with 980 ml of yellow fluid.  -11/11 left thoracentesis with removal of 980 mL of fluid--fluid cytology from 04/21/2019 with malignant cells 11/17 paracentesis with removal of 3.4 L of fluid-fluid cytology from 04/27/2019 with - Malignant cells consistent with metastatic adenocarcinoma  -   It was noted  that she has significant ascites and she has undergone multiple paracentesis which had improved her shortness of breath.  Pleural fluid cytology from 04/21/19 was positive for malignant cells. --Family conference with patient's daughter [name] on 05/03/2019, chaplain present, palliative care practitioner Vinie Sill also present -Patient is now DNR/DNI,  she is comfort cares -Plan is to transition her to hospice house  2. Metastatic breast cancer/ liver metastais/elevated LFTs. On chemotherapy as outpatient, will follow as outpatient.  LFTs were elevated on admission.  She recently underwent stent placement by Dr. Laural Golden in 03/2019.  GI consulted, appreciate input.  Started on Reglan for nausea and vomiting.  MRCP showed the biliary stent may be partially obstructed by liver mets.  It does not appear to be completely obstructed.  LFTs currently trending up.  Dr. Laural Golden has discussed with the  patient we will plan on ERCP on 11/19 for stent exchange.  Oncology is also following and will help develop further plan after procedure.   -Patient had ERCP on 04/29/2019 however unable to complete stent exchange---  LFTs continue to trend up -As per Dr Reesa Chew (IR) --- patient is not a candidate for percutaneous placement of biliary drain due to lack of significant biliary duct dilatation and small ducts  3.  Hepatic encephalopathy-patient with increasing confusion suspect multifactorial, suspect component of hepatic encephalopathy,  -Despite lactulose and rifaximin and multiple bowel movements Encephalopathy persist with confusion, compazine  was discontinued, decreased dose of Phenergan,  4. HTN.  Blood pressure stable-comfort care only  5. Asthma. Stable with no signs of exacerbation.   6.  Ascites.  Likely related to underlying liver disease/hypoalbuminemia-in the setting of metastatic breast cancer -.  Requiring frequent paracentesis  -Recurrent ascites may also be related to underlying malignant metastatic disease.  Patient has undergone paracentesis x3 during her hospital stay.   --Ascitic fluid from 04/27/2019 with malignant cells consistent with metastatic adenocarcinoma   7)Social/Ethics--- palliative care consult appreciated,  ---Family conference with patient's daughter [name] on 05/03/2019, chaplain present, palliative care practitioner Vinie Sill also present -Patient is now DNR/DNI,  she is comfort cares -Plan is to transition her to hospice house  8) intractable nausea/vomiting/anorexia-- -Palliative care input appreciated with regards to symptom management  10)Disposition- -Family conference with patient's daughter [name] on 05/03/2019, chaplain present, palliative care practitioner Vinie Sill also present -Patient is now DNR/DNI,  she is comfort cares -Plan is to transition her to hospice house   Code Status:  DNR Family Communication:  Daughter--  Adrianna  Discharge Condition: [place] prognosis Consults obtained -palliative care/GI service/interventional radiology  Diet and Activity recommendation:  As advised  Discharge Instructions    Discharge Instructions    Call MD for:  severe uncontrolled pain   Complete by: As directed    Diet - low sodium heart healthy   Complete by: As directed    Discharge instructions   Complete by: As directed    Transfer to hospice service with full comfort measures   Increase activity slowly   Complete by: As directed         Discharge Medications     Allergies as of 05/04/2019      Reactions   Salagen [pilocarpine] Other (See Comments)   Can cause liver failure    Tape Itching, Other (See Comments)   Depending on the adhesive-blistering occurs   Amoxicillin Hives, Other (See Comments)   Rash only DID THE REACTION INVOLVE: Swelling of the face/tongue/throat, SOB, or low BP? Sudden or severe rash/hives,  skin peeling, or the inside of the mouth or nose?  Did it require medical treatment?  When did it last happen? If all above answers are "NO", may proceed with cephalosporin use.   Caffeine Diarrhea, Nausea Only, Palpitations, Other (See Comments)   Headache   Tetanus Toxoids Swelling, Other (See Comments)   Local reaction      Medication List    STOP taking these medications   escitalopram 20 MG tablet Commonly known as: LEXAPRO   furosemide 20 MG tablet Commonly known as: LASIX   gabapentin 300 MG capsule Commonly known as: NEURONTIN   lubiprostone 8 MCG capsule Commonly known as: Amitiza   magnesium oxide 400 MG tablet Commonly known as: MAG-OX   metoprolol tartrate 25 MG tablet Commonly known as: LOPRESSOR   nystatin-triamcinolone cream Commonly known as: MYCOLOG II   oxyCODONE-acetaminophen 5-325 MG tablet Commonly known as: PERCOCET/ROXICET   pantoprazole 40 MG tablet Commonly known as: PROTONIX   potassium chloride 10 MEQ tablet Commonly known  as: KLOR-CON   prochlorperazine 10 MG tablet Commonly known as: COMPAZINE   promethazine 25 MG tablet Commonly known as: PHENERGAN   scopolamine 1 MG/3DAYS Commonly known as: TRANSDERM-SCOP   spironolactone 25 MG tablet Commonly known as: ALDACTONE   TAXOL IV   temazepam 15 MG capsule Commonly known as: RESTORIL       Major procedures and Radiology Reports - PLEASE review detailed and final reports for all details, in brief -   Dg Chest 1 View  Result Date: 04/21/2019 CLINICAL DATA:  LEFT pleural effusion post thoracentesis EXAM: CHEST  1 VIEW COMPARISON:  04/19/2019 FINDINGS: LEFT subclavian Port-A-Cath with tip projecting over cavoatrial junction. Stable heart size. Chronic elevation of RIGHT diaphragm. Bibasilar atelectasis accentuated by expiratory technique. No significant residual LEFT pleural effusion identified. No pneumothorax post thoracentesis. IMPRESSION: No pneumothorax post LEFT thoracentesis. Electronically Signed   By: Lavonia Dana M.D.   On: 04/21/2019 15:31   Dg Chest 2 View  Result Date: 04/26/2019 CLINICAL DATA:  Short of breath.  History of breast cancer. EXAM: CHEST - 2 VIEW COMPARISON:  04/22/2019 FINDINGS: Small left pleural effusion and left lower lobe airspace disease unchanged. Right lower lobe mild airspace disease also unchanged. Mild right upper lobe airspace disease unchanged. Port-A-Cath tip SVC unchanged. IMPRESSION: Bilateral airspace disease unchanged. Small left effusion unchanged. Electronically Signed   By: Franchot Gallo M.D.   On: 04/26/2019 09:04   Dg Chest 2 View  Result Date: 04/22/2019 CLINICAL DATA:  Patient admitted for pneumonia.  Worsening cough. EXAM: CHEST - 2 VIEW COMPARISON:  April 21, 2019 FINDINGS: Increased interstitial markings, right greater than left. Probable small effusions with atelectasis. Chronic elevation of the right hemidiaphragm. Stable cardiomediastinal silhouette. Increased density in the right hilar region is  stable, possibly atelectasis. The left Port-A-Cath is stable. IMPRESSION: 1. Increased interstitial markings may represent atypical infection versus asymmetric edema. 2. Increased density in the right hilum is stable, possibly atelectasis. Recommend attention on follow-up. 3. Probable small effusions. Electronically Signed   By: Dorise Bullion III M.D   On: 04/22/2019 18:21   Ct Angio Chest Pe W Or Wo Contrast  Result Date: 04/10/2019 CLINICAL DATA:  Diffuse chest pain for the past 2 weeks, worse this morning. Undergoing chemotherapy. Metastatic breast cancer. Abdominal distension. EXAM: CT ANGIOGRAPHY CHEST CT ABDOMEN AND PELVIS WITH CONTRAST TECHNIQUE: Multidetector CT imaging of the chest was performed using the standard protocol during bolus administration of intravenous contrast. Multiplanar CT image reconstructions and  MIPs were obtained to evaluate the vascular anatomy. Multidetector CT imaging of the abdomen and pelvis was performed using the standard protocol during bolus administration of intravenous contrast. CONTRAST:  151mL OMNIPAQUE IOHEXOL 350 MG/ML SOLN COMPARISON:  Portable chest obtained earlier today. Abdomen pelvis radiograph dated 03/16/2019. Abdomen and pelvis CT dated 01/28/2019. Chest CTA dated 12/13/2018. FINDINGS: CTA CHEST FINDINGS Cardiovascular: Satisfactory opacification of the pulmonary arteries to the segmental level. No evidence of pulmonary embolism. Normal heart size. No pericardial effusion. Mediastinum/Nodes: No enlarged mediastinal, hilar, or axillary lymph nodes. Thyroid gland, trachea, and esophagus demonstrate no significant findings. Lungs/Pleura: Moderate-sized bilateral pleural effusions, greater on the left. These have both increased in amount. Bilateral lower lobe compressive atelectasis, greater on the right. Increased prominence of the interstitial markings in both lungs. Musculoskeletal: Mild thoracic spine degenerative changes. No evidence of bony metastatic  disease. Review of the MIP images confirms the above findings. CT ABDOMEN and PELVIS FINDINGS Hepatobiliary: Multiple low density liver masses are again demonstrated. The largest is in the anterior right lobe, measuring 9.1 x 6.4 cm on image number 29 series 4. On 01/28/2019, this measured 11.2 x 6.9 cm in corresponding dimensions. There are multiple new masses more superiorly. The largest measures 6.9 x 5.6 cm on image number 19 series 4. Normal sized gallbladder with mild diffuse wall thickening and enhancement, similar to the previous examination. A common duct stent remains in place. Interval mild intrahepatic biliary ductal dilatation in the lateral segment left lobe. Pancreas: Moderate diffuse pancreatic atrophy. Spleen: Normal in size without focal abnormality. Adrenals/Urinary Tract: Adrenal glands are unremarkable. Kidneys are normal, without renal calculi, focal lesion, or hydronephrosis. Bladder is unremarkable. Stomach/Bowel: Stomach is within normal limits. Appendix appears normal. No evidence of bowel wall thickening, distention, or inflammatory changes. Vascular/Lymphatic: No significant vascular findings are present. No enlarged abdominal or pelvic lymph nodes. Reproductive: Status post hysterectomy. No adnexal masses. Other: Moderate to large amount of ascites, significantly increased. Musculoskeletal: Mild lumbar spine degenerative changes. No evidence of bony metastatic disease. Review of the MIP images confirms the above findings. IMPRESSION: 1. No pulmonary emboli. 2. Interval increase in moderate-sized bilateral pleural effusions, greater on the left. 3. Bilateral lower lobe compressive atelectasis, greater on the right. 4. Increased prominence of the interstitial markings in both lungs, compatible with interstitial pulmonary edema. 5. Progressive patent metastatic disease. 6. Interval mild intrahepatic biliary ductal dilatation in the lateral segment left lobe of the liver compatible with  partial obstruction by the metastases. 7. Moderate to large amount of ascites, significantly increased. Electronically Signed   By: Claudie Revering M.D.   On: 04/10/2019 06:39   [place] Chest (pleural Effusion)  Result Date: 04/23/2019 CLINICAL DATA:  Shortness of breath, breast cancer, BILATERAL pleural effusions, post LEFT thoracentesis 4 days ago, for RIGHT thoracentesis EXAM: CHEST ULTRASOUND COMPARISON:  Chest radiograph 04/22/2019 FINDINGS: Small RIGHT pleural effusion identified. Atelectasis of RIGHT lower lobe. Significant elevation of RIGHT diaphragm due to significant ascites in the abdomen. Nodular liver with multiple hepatic metastases. IMPRESSION: Small RIGHT pleural effusion is seen but more significant is presence of large volume ascites in the abdomen, likely source of patient's symptoms. Discussed with Dr. Roderic Palau and will proceed with paracentesis rather than RIGHT thoracentesis. Electronically Signed   By: Lavonia Dana M.D.   On: 04/23/2019 14:01   Ct Abdomen Pelvis W Contrast  Result Date: 04/10/2019 CLINICAL DATA:  Diffuse chest pain for the past 2 weeks, worse this morning. Undergoing chemotherapy. Metastatic breast cancer. Abdominal distension.  EXAM: CT ANGIOGRAPHY CHEST CT ABDOMEN AND PELVIS WITH CONTRAST TECHNIQUE: Multidetector CT imaging of the chest was performed using the standard protocol during bolus administration of intravenous contrast. Multiplanar CT image reconstructions and MIPs were obtained to evaluate the vascular anatomy. Multidetector CT imaging of the abdomen and pelvis was performed using the standard protocol during bolus administration of intravenous contrast. CONTRAST:  126mL OMNIPAQUE IOHEXOL 350 MG/ML SOLN COMPARISON:  Portable chest obtained earlier today. Abdomen pelvis radiograph dated 03/16/2019. Abdomen and pelvis CT dated 01/28/2019. Chest CTA dated 12/13/2018. FINDINGS: CTA CHEST FINDINGS Cardiovascular: Satisfactory opacification of the pulmonary arteries to  the segmental level. No evidence of pulmonary embolism. Normal heart size. No pericardial effusion. Mediastinum/Nodes: No enlarged mediastinal, hilar, or axillary lymph nodes. Thyroid gland, trachea, and esophagus demonstrate no significant findings. Lungs/Pleura: Moderate-sized bilateral pleural effusions, greater on the left. These have both increased in amount. Bilateral lower lobe compressive atelectasis, greater on the right. Increased prominence of the interstitial markings in both lungs. Musculoskeletal: Mild thoracic spine degenerative changes. No evidence of bony metastatic disease. Review of the MIP images confirms the above findings. CT ABDOMEN and PELVIS FINDINGS Hepatobiliary: Multiple low density liver masses are again demonstrated. The largest is in the anterior right lobe, measuring 9.1 x 6.4 cm on image number 29 series 4. On 01/28/2019, this measured 11.2 x 6.9 cm in corresponding dimensions. There are multiple new masses more superiorly. The largest measures 6.9 x 5.6 cm on image number 19 series 4. Normal sized gallbladder with mild diffuse wall thickening and enhancement, similar to the previous examination. A common duct stent remains in place. Interval mild intrahepatic biliary ductal dilatation in the lateral segment left lobe. Pancreas: Moderate diffuse pancreatic atrophy. Spleen: Normal in size without focal abnormality. Adrenals/Urinary Tract: Adrenal glands are unremarkable. Kidneys are normal, without renal calculi, focal lesion, or hydronephrosis. Bladder is unremarkable. Stomach/Bowel: Stomach is within normal limits. Appendix appears normal. No evidence of bowel wall thickening, distention, or inflammatory changes. Vascular/Lymphatic: No significant vascular findings are present. No enlarged abdominal or pelvic lymph nodes. Reproductive: Status post hysterectomy. No adnexal masses. Other: Moderate to large amount of ascites, significantly increased. Musculoskeletal: Mild lumbar spine  degenerative changes. No evidence of bony metastatic disease. Review of the MIP images confirms the above findings. IMPRESSION: 1. No pulmonary emboli. 2. Interval increase in moderate-sized bilateral pleural effusions, greater on the left. 3. Bilateral lower lobe compressive atelectasis, greater on the right. 4. Increased prominence of the interstitial markings in both lungs, compatible with interstitial pulmonary edema. 5. Progressive patent metastatic disease. 6. Interval mild intrahepatic biliary ductal dilatation in the lateral segment left lobe of the liver compatible with partial obstruction by the metastases. 7. Moderate to large amount of ascites, significantly increased. Electronically Signed   By: Claudie Revering M.D.   On: 04/10/2019 06:39   Mr Abdomen Mrcp Wo Contrast  Result Date: 04/26/2019 CLINICAL DATA:  Inpatient. Metastatic breast cancer to the liver with history of biliary obstruction requiring CBD stent. Patient now with worsening diffuse chest pain and abdominal distention for 2 weeks with ongoing chemotherapy. EXAM: MRI ABDOMEN WITHOUT CONTRAST  (INCLUDING MRCP) TECHNIQUE: Multiplanar multisequence MR imaging of the abdomen was performed. Heavily T2-weighted images of the biliary and pancreatic ducts were obtained, and three-dimensional MRCP images were rendered by post processing. COMPARISON:  09/18/2018 MRI abdomen.  04/10/2019 CT abdomen/pelvis. FINDINGS: Lower chest: Small to moderate dependent bilateral pleural effusions, increased on the right and similar on the left since 04/10/2019 CT study. Hepatobiliary:  Liver is enlarged by numerous (at least 10) similar liver masses scattered throughout the liver, all with signal intensity similar to the spleen (mildly T2 hyperintense and T1 hypointense), compatible with liver metastases. Representative liver masses as follows: -segment 4b/5 inferior liver 9.5 x 6.8 cm mass (series 9/image 43), previously 9.6 x 6.9 cm on 04/10/2019 CT using  similar measurement technique, not substantially changed -segment 4A left liver lobe 8.1 x 6.6 cm mass (series 9/image 30), previously 7.2 x 6.1 cm on 04/10/2019 CT using similar measurement technique, mildly increased -segment 8 right liver dome 1.9 x 1.9 cm mass (series 9/ image 21), previously 1.5 x 1.2 cm, mildly increased -far inferior right liver 2.8 x 2.4 cm mass (series 9/image 50), previously 2.5 x 2.1 cm, mildly increased No hepatic steatosis. Decompressed gallbladder. No definite gallbladder wall thickening. Small amount layering gallbladder sludge with no convincing gallstones. Well-positioned CBD stent in stable position with the distal tip in the duodenal lumen just beyond the ampulla. Mild intrahepatic biliary ductal dilatation throughout segments 2, 3 and 4A of the left liver lobe and minimal intrahepatic biliary ductal dilatation in the inferior central right liver lobe, not substantially changed since 04/10/2019 CT. Common bile duct diameter 10 mm. Gas and small amount of layering fluid noted in CBD stent. Pancreas: No pancreatic mass or duct dilation.  No pancreas divisum. Spleen: Normal size. No mass. Adrenals/Urinary Tract: Normal adrenals. No hydronephrosis. Normal kidneys with no renal mass. Stomach/Bowel: Normal non-distended stomach. Visualized small and large bowel is normal caliber, with no bowel wall thickening. Vascular/Lymphatic: Normal caliber abdominal aorta. No pathologically enlarged lymph nodes in the abdomen. Other: Moderate to large volume abdominal ascites, mildly increased. Musculoskeletal: No aggressive appearing focal osseous lesions. IMPRESSION: 1. Mild progression of hepatic metastatic disease since 04/10/2019 CT as detailed. 2. Stable well-positioned CBD stent. Mild intrahepatic biliary ductal dilatation, most prominent in the left liver lobe, not substantially changed since 04/10/2019 CT. 3. Moderate to large volume abdominal ascites, mildly increased. 4. Small to  moderate dependent bilateral pleural effusions, increased on the right and stable on the left. Electronically Signed   By: Ilona Sorrel M.D.   On: 04/26/2019 10:00   Mr 3d Recon At Scanner  Result Date: 04/26/2019 CLINICAL DATA:  Inpatient. Metastatic breast cancer to the liver with history of biliary obstruction requiring CBD stent. Patient now with worsening diffuse chest pain and abdominal distention for 2 weeks with ongoing chemotherapy. EXAM: MRI ABDOMEN WITHOUT CONTRAST  (INCLUDING MRCP) TECHNIQUE: Multiplanar multisequence MR imaging of the abdomen was performed. Heavily T2-weighted images of the biliary and pancreatic ducts were obtained, and three-dimensional MRCP images were rendered by post processing. COMPARISON:  09/18/2018 MRI abdomen.  04/10/2019 CT abdomen/pelvis. FINDINGS: Lower chest: Small to moderate dependent bilateral pleural effusions, increased on the right and similar on the left since 04/10/2019 CT study. Hepatobiliary: Liver is enlarged by numerous (at least 10) similar liver masses scattered throughout the liver, all with signal intensity similar to the spleen (mildly T2 hyperintense and T1 hypointense), compatible with liver metastases. Representative liver masses as follows: -segment 4b/5 inferior liver 9.5 x 6.8 cm mass (series 9/image 43), previously 9.6 x 6.9 cm on 04/10/2019 CT using similar measurement technique, not substantially changed -segment 4A left liver lobe 8.1 x 6.6 cm mass (series 9/image 30), previously 7.2 x 6.1 cm on 04/10/2019 CT using similar measurement technique, mildly increased -segment 8 right liver dome 1.9 x 1.9 cm mass (series 9/ image 21), previously 1.5 x 1.2 cm,  mildly increased -far inferior right liver 2.8 x 2.4 cm mass (series 9/image 50), previously 2.5 x 2.1 cm, mildly increased No hepatic steatosis. Decompressed gallbladder. No definite gallbladder wall thickening. Small amount layering gallbladder sludge with no convincing gallstones.  Well-positioned CBD stent in stable position with the distal tip in the duodenal lumen just beyond the ampulla. Mild intrahepatic biliary ductal dilatation throughout segments 2, 3 and 4A of the left liver lobe and minimal intrahepatic biliary ductal dilatation in the inferior central right liver lobe, not substantially changed since 04/10/2019 CT. Common bile duct diameter 10 mm. Gas and small amount of layering fluid noted in CBD stent. Pancreas: No pancreatic mass or duct dilation.  No pancreas divisum. Spleen: Normal size. No mass. Adrenals/Urinary Tract: Normal adrenals. No hydronephrosis. Normal kidneys with no renal mass. Stomach/Bowel: Normal non-distended stomach. Visualized small and large bowel is normal caliber, with no bowel wall thickening. Vascular/Lymphatic: Normal caliber abdominal aorta. No pathologically enlarged lymph nodes in the abdomen. Other: Moderate to large volume abdominal ascites, mildly increased. Musculoskeletal: No aggressive appearing focal osseous lesions. IMPRESSION: 1. Mild progression of hepatic metastatic disease since 04/10/2019 CT as detailed. 2. Stable well-positioned CBD stent. Mild intrahepatic biliary ductal dilatation, most prominent in the left liver lobe, not substantially changed since 04/10/2019 CT. 3. Moderate to large volume abdominal ascites, mildly increased. 4. Small to moderate dependent bilateral pleural effusions, increased on the right and stable on the left. Electronically Signed   By: Ilona Sorrel M.D.   On: 04/26/2019 10:00   US Paracentesis  Result Date: 04/27/2019 INDICATION: Breast cancer, ascites EXAM: ULTRASOUND GUIDED DIAGNOSTIC AND THERAPEUTIC PARACENTESIS MEDICATIONS: None COMPLICATIONS: None immediate PROCEDURE: Informed written consent was obtained from the patient after a discussion of the risks, benefits and alternatives to treatment. A timeout was performed prior to the initiation of the procedure. Initial ultrasound scanning demonstrates  a large amount of ascites within the right lower abdominal quadrant. The right lower abdomen was prepped and draped in the usual sterile fashion. 1% lidocaine was used for local anesthesia. Following this, a 5 [place] Yueh catheter was introduced. An ultrasound image was saved for documentation purposes. The paracentesis was performed. The catheter was removed and a dressing was applied. The patient tolerated the procedure well without immediate post procedural complication. Patient received post-procedure intravenous albumin; see nursing notes for details. FINDINGS: A total of approximately 3.4 L of yellow ascitic fluid was removed. Samples were sent to the laboratory as requested by the clinical team. IMPRESSION: Successful ultrasound-guided paracentesis yielding 3.4 liters of peritoneal fluid. Electronically Signed   By: Lavonia Dana M.D.   On: 04/27/2019 15:21   US Paracentesis  Result Date: 04/23/2019 INDICATION: Ascites, breast cancer, shortness of breath EXAM: ULTRASOUND GUIDED  PARACENTESIS MEDICATIONS: None COMPLICATIONS: None immediate PROCEDURE: Informed written consent was obtained from the patient after a discussion of the risks, benefits and alternatives to treatment. A timeout was performed prior to the initiation of the procedure. Initial ultrasound scanning demonstrates a large amount of ascites within the right lower abdominal quadrant. The right lower abdomen was prepped and draped in the usual sterile fashion. 1% lidocaine was used for local anesthesia. Following this, a 5 [place] Yueh catheter was introduced. An ultrasound image was saved for documentation purposes. The paracentesis was performed. The catheter was removed and a dressing was applied. The patient tolerated the procedure well without immediate post procedural complication. Patient received post-procedure intravenous albumin; see nursing notes for details. FINDINGS: A total of approximately 4.7  L of yellow ascitic fluid was  removed. IMPRESSION: Successful ultrasound-guided paracentesis yielding 4.7 liters of peritoneal fluid. Electronically Signed   By: Lavonia Dana M.D.   On: 04/23/2019 14:33   US Paracentesis  Result Date: 04/16/2019 INDICATION: Metastatic breast cancer, ascites EXAM: ULTRASOUND GUIDED THERAPEUTIC PARACENTESIS MEDICATIONS: None COMPLICATIONS: None immediate PROCEDURE: Informed written consent was obtained from the patient after a discussion of the risks, benefits and alternatives to treatment. A timeout was performed prior to the initiation of the procedure. Initial ultrasound scanning demonstrates a large amount of ascites within the LEFT lower abdominal quadrant. The right lower abdomen was prepped and draped in the usual sterile fashion. 1% lidocaine was used for local anesthesia. Following this, a 5 [place] Yueh catheter was introduced. An ultrasound image was saved for documentation purposes. The paracentesis was performed. The catheter was removed and a dressing was applied. The patient tolerated the procedure well without immediate post procedural complication. FINDINGS: A total of approximately 4.9 L of yellow ascitic fluid was removed. IMPRESSION: Successful ultrasound-guided paracentesis yielding 4.9 liters of peritoneal fluid. Electronically Signed   By: Lavonia Dana M.D.   On: 04/16/2019 11:51   Dg Chest Port 1 View  Result Date: 04/19/2019 CLINICAL DATA:  48 year old female with shortness of breath for 1 week. Productive cough. Undergoing chemotherapy for breast cancer. EXAM: PORTABLE CHEST 1 VIEW COMPARISON:  04/10/2019 and earlier. FINDINGS: Portable AP upright view at 1620 hours. Stable left chest Port-A-Cath. Continued bibasilar opacity which on CT the same day as prior was related to moderate to large bilateral pleural effusions. No superimposed pneumothorax. Pulmonary vascularity has decreased. Mild nonspecific patchy right upper lobe opacity now suspected at the anterior 2nd rib level. No  other confluent opacity. Stable cardiac size and mediastinal contours. Stable visible bowel gas pattern. Ascites present before. Metal CBD stent redemonstrated. Stable visualized osseous structures. IMPRESSION: 1. Suspect ongoing bilateral pleural effusions although pulmonary vascularity has decreased from prior. 2. Patchy nonspecific right upper lobe opacity. Pulmonary infection is possible. 3. Stable radiographic appearance of the upper abdomen. Electronically Signed   By: Genevie Ann M.D.   On: 04/19/2019 16:41   Dg Chest Port 1 View  Result Date: 04/10/2019 CLINICAL DATA:  Shortness of breath. Diffuse chest pain. EXAM: PORTABLE CHEST 1 VIEW COMPARISON:  01/28/2019 FINDINGS: Poor inspiration. Normal sized heart. Bibasilar atelectasis. Small left pleural effusion. Stable left subclavian porta catheter. Unremarkable bones. IMPRESSION: 1. Stable small left pleural effusion. 2. Poor inspiration with bibasilar atelectasis. Electronically Signed   By: Claudie Revering M.D.   On: 04/10/2019 05:49   Dg Ercp  Result Date: 04/29/2019 CLINICAL DATA:  49 year old female with a history of jaundice and biliary obstruction EXAM: ERCP TECHNIQUE: Multiple spot images obtained with the fluoroscopic device and submitted for interpretation post-procedure. FLUOROSCOPY TIME:  Fluoroscopy Time: Please refer to the dictated operative report for full details of intraoperative findings and procedure. COMPARISON:  MR 04/26/2019 FINDINGS: Limited images during ERCP. Initial image demonstrates endoscope projecting over the upper abdomen. Metallic biliary stent in place. There is then safety wire cannulation with partial opacification with retrograde contrast. Incomplete opacification of the intrahepatic biliary ducts. IMPRESSION: Limited images during ERCP, as above. Please refer to the dictated operative report for full details of intraoperative findings and procedure. Electronically Signed   By: Corrie Mckusick D.O.   On: 04/29/2019 16:28     US Thoracentesis Asp Pleural Space W/img Guide  Result Date: 04/21/2019 INDICATION: LEFT pleural effusion, breast cancer EXAM: ULTRASOUND GUIDED  DIAGNOSTIC AND THERAPEUTIC THORACENTESIS MEDICATIONS: None. COMPLICATIONS: None immediate. PROCEDURE: Procedure, benefits, and risks of procedure were discussed with patient. Written informed consent for procedure was obtained. Time out protocol followed. Pleural effusion localized by ultrasound at the posterior LEFT hemithorax. Skin prepped and draped in usual sterile fashion. Skin and soft tissues anesthetized with 10 mL of 1% lidocaine. 8 French thoracentesis catheter placed into the LEFT pleural space. 980 mL of yellow LEFT pleural fluid aspirated by syringe pump. Procedure tolerated well by patient without immediate complication. FINDINGS: A total of approximately 980 mL of LEFT pleural fluid was removed. Samples were sent to the laboratory as requested by the clinical team. IMPRESSION: Successful ultrasound guided LEFT thoracentesis yielding 980 mL of pleural fluid. Electronically Signed   By: Lavonia Dana M.D.   On: 04/21/2019 15:34    Micro Results   No results found for this or any previous visit (from the past 240 hour(s)).  Today   Subjective    Ed Zimmerly today is lethargic and confused      -Awaiting transfer to hospice house  Patient has been seen and examined prior to discharge   Objective   Blood pressure 91/65, pulse (!) 124, temperature 98.2 F (36.8 C), resp. rate 18, height 5\' 5"  (1.651 m), weight 73.9 kg, SpO2 93 %.   Intake/Output Summary (Last 24 hours) at 05/04/2019 1424 Last data filed at 05/04/2019 0900 Gross per 24 hour  Intake 0 ml  Output --  Net 0 ml   Exam Gen:-Lethargic, disoriented and confused HEENT:- [place].AT, +ve sclera icterus Neck-Supple Neck,No JVD,.  Lungs-  CTAB , good air movement bilaterally  CV- S1, S2 normal, regular Abd-  +ve B.Sounds, Abd Soft, No tenderness,     Extremity/Skin:-Jaundice, no  edema,   good pulses Psych-confused, disoriented and lethargic  neuro-lethargic  Data Review   CBC w Diff:  Lab Results  Component Value Date   WBC 15.3 (H) 05/03/2019   HGB 12.4 05/03/2019   HCT 36.2 05/03/2019   PLT 270 05/03/2019   LYMPHOPCT 4 04/29/2019   MONOPCT 8 04/29/2019   EOSPCT 0 04/29/2019   BASOPCT 0 04/29/2019   CMP:  Lab Results  Component Value Date   NA 133 (L) 05/03/2019   K 3.4 (L) 05/03/2019   CL 97 (L) 05/03/2019   CO2 23 05/03/2019   BUN 29 (H) 05/03/2019   CREATININE 0.54 05/03/2019   CREATININE 0.82 04/01/2017   PROT 6.1 (L) 05/03/2019   ALBUMIN 2.4 (L) 05/03/2019   BILITOT 19.8 (HH) 05/03/2019   ALKPHOS 987 (H) 05/03/2019   AST 365 (H) 05/03/2019   ALT 144 (H) 05/03/2019   Total Discharge time is about 33 minutes  Roxan Hockey M.D on 05/04/2019 at 2:24 PM  Go to [email or web address] -  for contact info  [facility] - Office  [phone]

## 2019-05-05 DIAGNOSIS — C50919 Malignant neoplasm of unspecified site of unspecified female breast: Secondary | ICD-10-CM

## 2019-05-05 LAB — SARS CORONAVIRUS 2 (TAT 6-24 HRS): SARS Coronavirus 2: NEGATIVE

## 2019-05-05 MED ORDER — HALOPERIDOL LACTATE 5 MG/ML IJ SOLN: 4.0000 mg | Freq: Four times a day (QID) | INTRAMUSCULAR | Status: DC | PRN

## 2019-05-05 MED ORDER — HALOPERIDOL LACTATE 5 MG/ML IJ SOLN
4.0000 mg | Freq: Four times a day (QID) | INTRAMUSCULAR | Status: DC
  Filled 2019-05-05: qty 1

## 2019-05-05 MED ORDER — HYDROMORPHONE HCL 1 MG/ML IJ SOLN
1.0000 mg | Freq: Four times a day (QID) | INTRAMUSCULAR | Status: DC
  Administered 2019-05-05 (×2): 1 mg via INTRAVENOUS
  Filled 2019-05-05 (×2): qty 1

## 2019-05-05 MED ORDER — HYDROMORPHONE HCL 1 MG/ML IJ SOLN: 1.0000 mg | INTRAMUSCULAR | Status: DC | PRN

## 2019-05-05 MED ORDER — GLYCOPYRROLATE 0.2 MG/ML IJ SOLN
0.4000 mg | INTRAMUSCULAR | Status: DC
  Administered 2019-05-05: 16:00:00 0.4 mg via INTRAVENOUS
  Filled 2019-05-05 (×2): qty 2

## 2019-05-05 NOTE — Progress Notes (Signed)
Upon am assessment, pt very restless, moaning. Morphine 2mg  IV given per order. Pt with no recorded urine output noted in last 18 hours. Abd distended, firm to palpation. Pt with increased facial grimacing when lower abd palpated. Bladder scan performed and showed >977ml of urine x4. MD notified and order received for foley cath insertion. Foley cath inserted using sterile technique with immediate return of 449ml dark brownish-orange colored urine. Abd softer, non-distended after insertion. Pt continues with restlessness, facial grimacing and moaning.  Pt's respirations slightly labored with congestion noted upper airway. Will administer Ativan for anxiety/restlessness.

## 2019-05-05 NOTE — Progress Notes (Signed)
Hospice Nurse Mont Dutton, RN here to evaluate patient. Pt appropriate for transfer to [facility] but most recent covid-10 test has not yet resulted (collected 05/04/19 @ 1930). Will continue to monitor for test result and update Hospice House with result, then can arrange transport.

## 2019-05-05 NOTE — Progress Notes (Signed)
Palliative:  HPI: 48 y.o.femalewith past medical history of metastatic breast cancer, asthma, depression, anxietyadmitted on11/9/2020with SOB, cough d/t community acquired pneumonia.S/P left thoracentesis 04/21/19 cytology positive for malignant cells. Chemotherapy on hold s/t elevated LFTs last given 02/25/19 but continued progression on therapy. Now with worsening ascites and GI consulting forlikely ERCP andpotential stenting since bilirubin continues to increase. S/P paracentesis 04/23/19 4.7L and 04/16/19 4.9L.Has had worsening confusion over the weekend and continues to decline with worsening liver function and climbing bilirubin. Progressing at end of life.   Pamela Long continues to be restless and moaning. She still does not appear comfortable. No family at bedside. She is awaiting COVID test results before she can transition to hospice facility.   Upon re-examination this afternoon she appears much more comfortable but has also progressed with oxygen dropping and secretions setting in. Spoke with nurse who is present at bedside for support. I have called daughter, Pamela Long, and told her that I feel that time is getting close and I would not be surprised if her mother passes away today or tomorrow. She understands and will call friends to bring her to the hospital as soon as possible. I encouraged her to say anything she feels she needs to when she sees her mother. I also called friend, Pamela Long, who will contact Adrianna soon to continue to be a support to her.   All questions/concerns addressed. Emotional support provided.   Exam: Unresponsive. Jaundice. No more moaning. Breathing shallow. Abd distended. Extremities cool to touch. Appears to be resting comfortably.   Plan: - Pain/SOB: Dilaudid 1 mg every 6 hours scheduled and every hour prn.  - Anxiety: Ativan 1 mg every 4 hours prn.  Agitation: Haldol 4 mg IV every 6 hours prn.  - Robinul scheduled for secretions.   25 min  Vinie Sill, NP Palliative Medicine Team Pager [phone] (Please see [email or web address] for schedule) Team Phone [phone]    Greater than 50%  of this time was spent counseling and coordinating care related to the above assessment and plan

## 2019-05-05 NOTE — Progress Notes (Signed)
Covid test negative, Hospice Nurse Mont Dutton, RN called and notified, states pt OK to transfer to [facility]. PT's daughter here and updated on status of patient and transfer. Dr. Rosalva Ferron in to speak with daughter as well and answer questions. Pt currently responsive to painful stimuli, occasional moaning. Scheduled IV Dilaudid admin'd per order. Resp remain 10-12/min, non-labored, less gurgling noted. EMS notified of need for transport. Requested documents faxed to [facility] [place].

## 2019-05-05 NOTE — Progress Notes (Signed)
Pt remains comfort care only. Continues with decline as expected.

## 2019-05-05 NOTE — Discharge Summary (Addendum)
Pamela Long, is a 48 y.o. female  DOB 1971-02-16  MRN [id number].  Admission date:  05/04/2019  Admitting Physician  Roxan Hockey, MD  Discharge Date:  05/05/2019   Primary MD  Doree Albee, MD  Recommendations for primary care physician for things to follow:   Transfer to hospice house with comfort measures only  Admission Diagnosis  .   Discharge Diagnosis  .    Principal Problem:   Metastatic breast cancer ([place]) Active Problems:   Cancer associated pain   Acute hepatic encephalopathy   Malignant neoplasm of central portion of left breast in female, estrogen receptor positive (HCC)   Malignant pleural effusion   DNR (do not resuscitate)      Past Medical History:  Diagnosis Date   Anemia    Anxiety    Asthma    Breast cancer ([place])    Left, 0000000   Complication of anesthesia    Depression    GERD (gastroesophageal reflux disease)    Migraine    OAB (overactive bladder)    PONV (postoperative nausea and vomiting)    Seasonal allergies     Past Surgical History:  Procedure Laterality Date   ABDOMINAL HYSTERECTOMY     breast cancer   ANKLE RECONSTRUCTION Right 1989   BILIARY STENT PLACEMENT N/A 09/22/2018   Procedure: BILIARY STENT PLACEMENT;  Surgeon: Rogene Houston, MD;  [facility];  Service: Endoscopy;  Laterality: N/A;   BILIARY STENT PLACEMENT N/A 03/16/2019   Procedure: BILIARY STENT EXCHANGE;  Surgeon: Rogene Houston, MD;  [facility]: [facility];  Service: Endoscopy;  Laterality: N/A;   BILIARY STENT PLACEMENT N/A 04/29/2019   Procedure: ATTEMPTED BILIARY STENT PLACEMENT;  Surgeon: Rogene Houston, MD;  [facility];  Service: Endoscopy;  Laterality: N/A;   BREAST SURGERY     COLONOSCOPY WITH PROPOFOL N/A 08/06/2018   Dr. Oneida Alar: small internal hemorrhoids, moderate external hemorrhoids. rectal bleeding due to ?anal  fissure vs hemorrhoids. nex tcs 10 years   ERCP N/A 09/22/2018   Procedure: ENDOSCOPIC RETROGRADE CHOLANGIOPANCREATOGRAPHY (ERCP);  Surgeon: Rogene Houston, MD;  [facility];  Service: Endoscopy;  Laterality: N/A;   ERCP N/A 03/16/2019   Procedure: ENDOSCOPIC RETROGRADE CHOLANGIOPANCREATOGRAPHY (ERCP);  Surgeon: Rogene Houston, MD;  [facility];  Service: Endoscopy;  Laterality: N/A;   ERCP N/A 04/29/2019   Procedure: ENDOSCOPIC RETROGRADE CHOLANGIOPANCREATOGRAPHY (ERCP);  Surgeon: Rogene Houston, MD;  [facility];  Service: Endoscopy;  Laterality: N/A;  To be done at 1:30pm in OR 4   ESOPHAGOGASTRODUODENOSCOPY (EGD) WITH PROPOFOL N/A 04/29/2019   Procedure: ESOPHAGOGASTRODUODENOSCOPY (EGD) WITH PROPOFOL;  Surgeon: Rogene Houston, MD;  [facility];  Service: Endoscopy;  Laterality: N/A;   FOREIGN BODY REMOVAL N/A 08/29/2017   from lip   KNEE SURGERY Right 1989   bone spur   MASTECTOMY  2017   bil mastectomies   PORTACATH PLACEMENT Left 12/23/2018   Procedure: INSERTION PORT-A-CATH LEFT SUBCLAVIAN;  Surgeon: Aviva Signs, MD;  Location: [facility];  Service: General;  Laterality: Left;   SINUSOTOMY         HPI  from the history and physical done on the day of admission:    - Chief Complaint: Shortness of breath, cough  HPI: Pamela Long is a 48 y.o. female with medical history significant for metastatic breast cancer, depression, asthma.  Patient presented to the ED with complaints of worsening cough productive of whitish sputum and difficulty breathing over the past 1 week.  She reports persistent pleuritic chest pain from recent hospitalization and is worse with coughing and difficulty breathing.  No known Covid positive contacts.  Chronic mild unchanged lower extremity swelling slightly worse on the left.  Reports poor p.o. intake with nausea and occasional vomiting.  No loose stools.  Recent hospitalization 10/31 - 11/1,  for intractable nausea and vomiting with generalized weakness, thought secondary to chemo treatment and ongoing metastatic malignancy.  Had CTA chest done for pleuritic chest discomfort and tachycardia which was negative for pulmonary embolism.  ED Course: Tachycardic to 119, O2 sats 89% on room air.  Covid test ordered and pending.  WBC 10.1, potassium 2.9.  Portable chest x-ray ongoing bilateral pleural effusion suspected, with reduced pulmonary vascularity, patchy right upper lobe opacity possibly pulmonary infection.  IV Levaquin, IV Lasix 40 mg x 1.  Hospitalist to admit for further management.    Hospital Course:     Brief Narrative:  48 year old female who presented with cough and dyspnea.  She does have significant past medical history for metastatic breast cancer, depression and asthma.  She reported worsening productive cough, associated with difficulty breathing for about a week.  Positive pleuritic chest pain.  On her initial physical examination her heart rate was 119, oxygen saturation 89% on room air, blood pressure 131/86, respiratory rate 19.  Her lungs were clear to auscultation bilaterally, heart S1-S2 present, tachycardic, abdomen soft, trace lower extremity edema more left than right SARS COVID-19 negative.  Chest radiograph with faint infiltrate right upper lobe, atelectasis left lower lobe with small pleural effusion.   Patient was admitted to the hospital with the working diagnosis of acute hypoxic respiratory failure due to community-acquired pneumonia, present on admission.  She has been responding well to antibiotic therapy with levofloxacin. Underwent left pleural effusion thoracentesis with 980 fluid obtained.  -11/11 left thoracentesis with removal of 980 mL of fluid--fluid cytology from 04/21/2019 with malignant cells 11/17 paracentesis with removal of 3.4 L of fluid-fluid cytology from 04/27/2019 with - Malignant cells consistent with metastatic adenocarcinoma    -Family conference with patient's daughter Pamela Long on 05/03/2019, chaplain present, palliative care practitioner Vinie Sill also present -Patient is now DNR/DNI,  she is comfort cares -Plan is to transition her to hospice house   A/p 1. Acute hypoxic respiratory failure due to right upper lobe community acquired pneumonia, complicated with left plural effusion. Patient complained of dyspnea and pleuritic chest pain, oximetry is 97% on 2 LPM per [place]. US guided thoracentesis to the left with 980 ml of yellow fluid.  -11/11 left thoracentesis with removal of 980 mL of fluid--fluid cytology from 04/21/2019 with malignant cells 11/17 paracentesis with removal of 3.4 L of fluid-fluid cytology from 04/27/2019 with - Malignant cells consistent with metastatic adenocarcinoma  -   It was noted that she has significant ascites and she has undergone multiple paracentesis which had improved her shortness of breath.  Pleural fluid cytology from 04/21/19 was positive for malignant  cells. --Family conference with patient's daughter Pamela Long on 05/03/2019, chaplain present, palliative care practitioner Vinie Sill also present -Patient is now DNR/DNI,  she is comfort cares -Plan is to transition her to hospice house  2. Metastatic breast cancer/ liver metastais/elevated LFTs. On chemotherapy as outpatient, will follow as outpatient.  LFTs were elevated on admission.  She recently underwent stent placement by Dr. Laural Golden in 03/2019.  GI consulted, appreciate input.  Started on Reglan for nausea and vomiting.  MRCP showed the biliary stent may be partially obstructed by liver mets.  It does not appear to be completely obstructed.  LFTs currently trending up.  Dr. Laural Golden has discussed with the patient we will plan on ERCP on 11/19 for stent exchange.  Oncology is also following and will help develop further plan after procedure.   -Patient had ERCP on 04/29/2019 however unable to complete stent exchange---   LFTs continue to trend up -As per Dr Reesa Chew (IR) --- patient is not a candidate for percutaneous placement of biliary drain due to lack of significant biliary duct dilatation and small ducts  3.  Hepatic encephalopathy-patient with increasing confusion suspect multifactorial, suspect component of hepatic encephalopathy,  -Despite lactulose and rifaximin and multiple bowel movements Encephalopathy persist with confusion, compazine  was discontinued, decreased dose of Phenergan,  4. HTN.  Blood pressure stable-comfort care only  5. Asthma. Stable with no signs of exacerbation.   6.  Ascites.  Likely related to underlying liver disease/hypoalbuminemia-in the setting of metastatic breast cancer -.  Requiring frequent paracentesis  -Recurrent ascites may also be related to underlying malignant metastatic disease.  Patient has undergone paracentesis x3 during her hospital stay.   --Ascitic fluid from 04/27/2019 with malignant cells consistent with metastatic adenocarcinoma   7)Social/Ethics--- palliative care consult appreciated,  ---Family conference with patient's daughter Pamela Long on 05/03/2019, chaplain present, palliative care practitioner Vinie Sill also present -Patient is now DNR/DNI,  she is comfort cares -Plan is to transition her to hospice house  8) intractable nausea/vomiting/anorexia-- -Palliative care input appreciated with regards to symptom management  10)Disposition- -Family conference with patient's daughter Pamela Long on 05/03/2019, chaplain present, palliative care practitioner Vinie Sill also present -Patient is now DNR/DNI,  she is comfort cares -Plan is to transition her to hospice house   Code Status:  DNR Family Communication:  Daughter-- Pamela Long  Discharge Condition: [place] prognosis Consults obtained -palliative care/GI service/interventional radiology  Diet and Activity recommendation:  As advised  Discharge Instructions    Discharge  Instructions    Bed rest   Complete by: As directed    Discharge instructions   Complete by: As directed    Discharge to hospice house with full comfort measures        Discharge Medications     Allergies as of 05/05/2019      Reactions   Salagen [pilocarpine] Other (See Comments)   Can cause liver failure    Tape Itching, Other (See Comments)   Depending on the adhesive-blistering occurs   Amoxicillin Hives, Other (See Comments)   Rash only DID THE REACTION INVOLVE: Swelling of the face/tongue/throat, SOB, or low BP? Sudden or severe rash/hives, skin peeling, or the inside of the mouth or nose?  Did it require medical treatment?  When did it last happen? If all above answers are "NO", may proceed with cephalosporin use.   Caffeine Diarrhea, Nausea Only, Palpitations, Other (See Comments)   Headache   Tetanus Toxoids Swelling, Other (See Comments)   Local reaction  Medication List    You have not been prescribed any medications.     Major procedures and Radiology Reports - PLEASE review detailed and final reports for all details, in brief -   Dg Chest 1 View  Result Date: 04/21/2019 CLINICAL DATA:  LEFT pleural effusion post thoracentesis EXAM: CHEST  1 VIEW COMPARISON:  04/19/2019 FINDINGS: LEFT subclavian Port-A-Cath with tip projecting over cavoatrial junction. Stable heart size. Chronic elevation of RIGHT diaphragm. Bibasilar atelectasis accentuated by expiratory technique. No significant residual LEFT pleural effusion identified. No pneumothorax post thoracentesis. IMPRESSION: No pneumothorax post LEFT thoracentesis. Electronically Signed   By: Lavonia Dana M.D.   On: 04/21/2019 15:31   Dg Chest 2 View  Result Date: 04/26/2019 CLINICAL DATA:  Short of breath.  History of breast cancer. EXAM: CHEST - 2 VIEW COMPARISON:  04/22/2019 FINDINGS: Small left pleural effusion and left lower lobe airspace disease unchanged. Right lower lobe mild airspace disease  also unchanged. Mild right upper lobe airspace disease unchanged. Port-A-Cath tip SVC unchanged. IMPRESSION: Bilateral airspace disease unchanged. Small left effusion unchanged. Electronically Signed   By: Franchot Gallo M.D.   On: 04/26/2019 09:04   Dg Chest 2 View  Result Date: 04/22/2019 CLINICAL DATA:  Patient admitted for pneumonia.  Worsening cough. EXAM: CHEST - 2 VIEW COMPARISON:  April 21, 2019 FINDINGS: Increased interstitial markings, right greater than left. Probable small effusions with atelectasis. Chronic elevation of the right hemidiaphragm. Stable cardiomediastinal silhouette. Increased density in the right hilar region is stable, possibly atelectasis. The left Port-A-Cath is stable. IMPRESSION: 1. Increased interstitial markings may represent atypical infection versus asymmetric edema. 2. Increased density in the right hilum is stable, possibly atelectasis. Recommend attention on follow-up. 3. Probable small effusions. Electronically Signed   By: Dorise Bullion III M.D   On: 04/22/2019 18:21   Ct Angio Chest Pe W Or Wo Contrast  Result Date: 04/10/2019 CLINICAL DATA:  Diffuse chest pain for the past 2 weeks, worse this morning. Undergoing chemotherapy. Metastatic breast cancer. Abdominal distension. EXAM: CT ANGIOGRAPHY CHEST CT ABDOMEN AND PELVIS WITH CONTRAST TECHNIQUE: Multidetector CT imaging of the chest was performed using the standard protocol during bolus administration of intravenous contrast. Multiplanar CT image reconstructions and MIPs were obtained to evaluate the vascular anatomy. Multidetector CT imaging of the abdomen and pelvis was performed using the standard protocol during bolus administration of intravenous contrast. CONTRAST:  140mL OMNIPAQUE IOHEXOL 350 MG/ML SOLN COMPARISON:  Portable chest obtained earlier today. Abdomen pelvis radiograph dated 03/16/2019. Abdomen and pelvis CT dated 01/28/2019. Chest CTA dated 12/13/2018. FINDINGS: CTA CHEST FINDINGS  Cardiovascular: Satisfactory opacification of the pulmonary arteries to the segmental level. No evidence of pulmonary embolism. Normal heart size. No pericardial effusion. Mediastinum/Nodes: No enlarged mediastinal, hilar, or axillary lymph nodes. Thyroid gland, trachea, and esophagus demonstrate no significant findings. Lungs/Pleura: Moderate-sized bilateral pleural effusions, greater on the left. These have both increased in amount. Bilateral lower lobe compressive atelectasis, greater on the right. Increased prominence of the interstitial markings in both lungs. Musculoskeletal: Mild thoracic spine degenerative changes. No evidence of bony metastatic disease. Review of the MIP images confirms the above findings. CT ABDOMEN and PELVIS FINDINGS Hepatobiliary: Multiple low density liver masses are again demonstrated. The largest is in the anterior right lobe, measuring 9.1 x 6.4 cm on image number 29 series 4. On 01/28/2019, this measured 11.2 x 6.9 cm in corresponding dimensions. There are multiple new masses more superiorly. The largest measures 6.9 x 5.6 cm on image number  19 series 4. Normal sized gallbladder with mild diffuse wall thickening and enhancement, similar to the previous examination. A common duct stent remains in place. Interval mild intrahepatic biliary ductal dilatation in the lateral segment left lobe. Pancreas: Moderate diffuse pancreatic atrophy. Spleen: Normal in size without focal abnormality. Adrenals/Urinary Tract: Adrenal glands are unremarkable. Kidneys are normal, without renal calculi, focal lesion, or hydronephrosis. Bladder is unremarkable. Stomach/Bowel: Stomach is within normal limits. Appendix appears normal. No evidence of bowel wall thickening, distention, or inflammatory changes. Vascular/Lymphatic: No significant vascular findings are present. No enlarged abdominal or pelvic lymph nodes. Reproductive: Status post hysterectomy. No adnexal masses. Other: Moderate to large amount  of ascites, significantly increased. Musculoskeletal: Mild lumbar spine degenerative changes. No evidence of bony metastatic disease. Review of the MIP images confirms the above findings. IMPRESSION: 1. No pulmonary emboli. 2. Interval increase in moderate-sized bilateral pleural effusions, greater on the left. 3. Bilateral lower lobe compressive atelectasis, greater on the right. 4. Increased prominence of the interstitial markings in both lungs, compatible with interstitial pulmonary edema. 5. Progressive patent metastatic disease. 6. Interval mild intrahepatic biliary ductal dilatation in the lateral segment left lobe of the liver compatible with partial obstruction by the metastases. 7. Moderate to large amount of ascites, significantly increased. Electronically Signed   By: Claudie Revering M.D.   On: 04/10/2019 06:39   [place] Chest (pleural Effusion)  Result Date: 04/23/2019 CLINICAL DATA:  Shortness of breath, breast cancer, BILATERAL pleural effusions, post LEFT thoracentesis 4 days ago, for RIGHT thoracentesis EXAM: CHEST ULTRASOUND COMPARISON:  Chest radiograph 04/22/2019 FINDINGS: Small RIGHT pleural effusion identified. Atelectasis of RIGHT lower lobe. Significant elevation of RIGHT diaphragm due to significant ascites in the abdomen. Nodular liver with multiple hepatic metastases. IMPRESSION: Small RIGHT pleural effusion is seen but more significant is presence of large volume ascites in the abdomen, likely source of patient's symptoms. Discussed with Dr. Roderic Palau and will proceed with paracentesis rather than RIGHT thoracentesis. Electronically Signed   By: Lavonia Dana M.D.   On: 04/23/2019 14:01   Ct Abdomen Pelvis W Contrast  Result Date: 04/10/2019 CLINICAL DATA:  Diffuse chest pain for the past 2 weeks, worse this morning. Undergoing chemotherapy. Metastatic breast cancer. Abdominal distension. EXAM: CT ANGIOGRAPHY CHEST CT ABDOMEN AND PELVIS WITH CONTRAST TECHNIQUE: Multidetector CT imaging of the  chest was performed using the standard protocol during bolus administration of intravenous contrast. Multiplanar CT image reconstructions and MIPs were obtained to evaluate the vascular anatomy. Multidetector CT imaging of the abdomen and pelvis was performed using the standard protocol during bolus administration of intravenous contrast. CONTRAST:  139mL OMNIPAQUE IOHEXOL 350 MG/ML SOLN COMPARISON:  Portable chest obtained earlier today. Abdomen pelvis radiograph dated 03/16/2019. Abdomen and pelvis CT dated 01/28/2019. Chest CTA dated 12/13/2018. FINDINGS: CTA CHEST FINDINGS Cardiovascular: Satisfactory opacification of the pulmonary arteries to the segmental level. No evidence of pulmonary embolism. Normal heart size. No pericardial effusion. Mediastinum/Nodes: No enlarged mediastinal, hilar, or axillary lymph nodes. Thyroid gland, trachea, and esophagus demonstrate no significant findings. Lungs/Pleura: Moderate-sized bilateral pleural effusions, greater on the left. These have both increased in amount. Bilateral lower lobe compressive atelectasis, greater on the right. Increased prominence of the interstitial markings in both lungs. Musculoskeletal: Mild thoracic spine degenerative changes. No evidence of bony metastatic disease. Review of the MIP images confirms the above findings. CT ABDOMEN and PELVIS FINDINGS Hepatobiliary: Multiple low density liver masses are again demonstrated. The largest is in the anterior right lobe, measuring 9.1 x 6.4 cm  on image number 29 series 4. On 01/28/2019, this measured 11.2 x 6.9 cm in corresponding dimensions. There are multiple new masses more superiorly. The largest measures 6.9 x 5.6 cm on image number 19 series 4. Normal sized gallbladder with mild diffuse wall thickening and enhancement, similar to the previous examination. A common duct stent remains in place. Interval mild intrahepatic biliary ductal dilatation in the lateral segment left lobe. Pancreas: Moderate  diffuse pancreatic atrophy. Spleen: Normal in size without focal abnormality. Adrenals/Urinary Tract: Adrenal glands are unremarkable. Kidneys are normal, without renal calculi, focal lesion, or hydronephrosis. Bladder is unremarkable. Stomach/Bowel: Stomach is within normal limits. Appendix appears normal. No evidence of bowel wall thickening, distention, or inflammatory changes. Vascular/Lymphatic: No significant vascular findings are present. No enlarged abdominal or pelvic lymph nodes. Reproductive: Status post hysterectomy. No adnexal masses. Other: Moderate to large amount of ascites, significantly increased. Musculoskeletal: Mild lumbar spine degenerative changes. No evidence of bony metastatic disease. Review of the MIP images confirms the above findings. IMPRESSION: 1. No pulmonary emboli. 2. Interval increase in moderate-sized bilateral pleural effusions, greater on the left. 3. Bilateral lower lobe compressive atelectasis, greater on the right. 4. Increased prominence of the interstitial markings in both lungs, compatible with interstitial pulmonary edema. 5. Progressive patent metastatic disease. 6. Interval mild intrahepatic biliary ductal dilatation in the lateral segment left lobe of the liver compatible with partial obstruction by the metastases. 7. Moderate to large amount of ascites, significantly increased. Electronically Signed   By: Claudie Revering M.D.   On: 04/10/2019 06:39   Mr Abdomen Mrcp Wo Contrast  Result Date: 04/26/2019 CLINICAL DATA:  Inpatient. Metastatic breast cancer to the liver with history of biliary obstruction requiring CBD stent. Patient now with worsening diffuse chest pain and abdominal distention for 2 weeks with ongoing chemotherapy. EXAM: MRI ABDOMEN WITHOUT CONTRAST  (INCLUDING MRCP) TECHNIQUE: Multiplanar multisequence MR imaging of the abdomen was performed. Heavily T2-weighted images of the biliary and pancreatic ducts were obtained, and three-dimensional MRCP  images were rendered by post processing. COMPARISON:  09/18/2018 MRI abdomen.  04/10/2019 CT abdomen/pelvis. FINDINGS: Lower chest: Small to moderate dependent bilateral pleural effusions, increased on the right and similar on the left since 04/10/2019 CT study. Hepatobiliary: Liver is enlarged by numerous (at least 10) similar liver masses scattered throughout the liver, all with signal intensity similar to the spleen (mildly T2 hyperintense and T1 hypointense), compatible with liver metastases. Representative liver masses as follows: -segment 4b/5 inferior liver 9.5 x 6.8 cm mass (series 9/image 43), previously 9.6 x 6.9 cm on 04/10/2019 CT using similar measurement technique, not substantially changed -segment 4A left liver lobe 8.1 x 6.6 cm mass (series 9/image 30), previously 7.2 x 6.1 cm on 04/10/2019 CT using similar measurement technique, mildly increased -segment 8 right liver dome 1.9 x 1.9 cm mass (series 9/ image 21), previously 1.5 x 1.2 cm, mildly increased -far inferior right liver 2.8 x 2.4 cm mass (series 9/image 50), previously 2.5 x 2.1 cm, mildly increased No hepatic steatosis. Decompressed gallbladder. No definite gallbladder wall thickening. Small amount layering gallbladder sludge with no convincing gallstones. Well-positioned CBD stent in stable position with the distal tip in the duodenal lumen just beyond the ampulla. Mild intrahepatic biliary ductal dilatation throughout segments 2, 3 and 4A of the left liver lobe and minimal intrahepatic biliary ductal dilatation in the inferior central right liver lobe, not substantially changed since 04/10/2019 CT. Common bile duct diameter 10 mm. Gas and small amount of layering fluid  noted in CBD stent. Pancreas: No pancreatic mass or duct dilation.  No pancreas divisum. Spleen: Normal size. No mass. Adrenals/Urinary Tract: Normal adrenals. No hydronephrosis. Normal kidneys with no renal mass. Stomach/Bowel: Normal non-distended stomach. Visualized  small and large bowel is normal caliber, with no bowel wall thickening. Vascular/Lymphatic: Normal caliber abdominal aorta. No pathologically enlarged lymph nodes in the abdomen. Other: Moderate to large volume abdominal ascites, mildly increased. Musculoskeletal: No aggressive appearing focal osseous lesions. IMPRESSION: 1. Mild progression of hepatic metastatic disease since 04/10/2019 CT as detailed. 2. Stable well-positioned CBD stent. Mild intrahepatic biliary ductal dilatation, most prominent in the left liver lobe, not substantially changed since 04/10/2019 CT. 3. Moderate to large volume abdominal ascites, mildly increased. 4. Small to moderate dependent bilateral pleural effusions, increased on the right and stable on the left. Electronically Signed   By: Ilona Sorrel M.D.   On: 04/26/2019 10:00   Mr 3d Recon At Scanner  Result Date: 04/26/2019 CLINICAL DATA:  Inpatient. Metastatic breast cancer to the liver with history of biliary obstruction requiring CBD stent. Patient now with worsening diffuse chest pain and abdominal distention for 2 weeks with ongoing chemotherapy. EXAM: MRI ABDOMEN WITHOUT CONTRAST  (INCLUDING MRCP) TECHNIQUE: Multiplanar multisequence MR imaging of the abdomen was performed. Heavily T2-weighted images of the biliary and pancreatic ducts were obtained, and three-dimensional MRCP images were rendered by post processing. COMPARISON:  09/18/2018 MRI abdomen.  04/10/2019 CT abdomen/pelvis. FINDINGS: Lower chest: Small to moderate dependent bilateral pleural effusions, increased on the right and similar on the left since 04/10/2019 CT study. Hepatobiliary: Liver is enlarged by numerous (at least 10) similar liver masses scattered throughout the liver, all with signal intensity similar to the spleen (mildly T2 hyperintense and T1 hypointense), compatible with liver metastases. Representative liver masses as follows: -segment 4b/5 inferior liver 9.5 x 6.8 cm mass (series 9/image 43),  previously 9.6 x 6.9 cm on 04/10/2019 CT using similar measurement technique, not substantially changed -segment 4A left liver lobe 8.1 x 6.6 cm mass (series 9/image 30), previously 7.2 x 6.1 cm on 04/10/2019 CT using similar measurement technique, mildly increased -segment 8 right liver dome 1.9 x 1.9 cm mass (series 9/ image 21), previously 1.5 x 1.2 cm, mildly increased -far inferior right liver 2.8 x 2.4 cm mass (series 9/image 50), previously 2.5 x 2.1 cm, mildly increased No hepatic steatosis. Decompressed gallbladder. No definite gallbladder wall thickening. Small amount layering gallbladder sludge with no convincing gallstones. Well-positioned CBD stent in stable position with the distal tip in the duodenal lumen just beyond the ampulla. Mild intrahepatic biliary ductal dilatation throughout segments 2, 3 and 4A of the left liver lobe and minimal intrahepatic biliary ductal dilatation in the inferior central right liver lobe, not substantially changed since 04/10/2019 CT. Common bile duct diameter 10 mm. Gas and small amount of layering fluid noted in CBD stent. Pancreas: No pancreatic mass or duct dilation.  No pancreas divisum. Spleen: Normal size. No mass. Adrenals/Urinary Tract: Normal adrenals. No hydronephrosis. Normal kidneys with no renal mass. Stomach/Bowel: Normal non-distended stomach. Visualized small and large bowel is normal caliber, with no bowel wall thickening. Vascular/Lymphatic: Normal caliber abdominal aorta. No pathologically enlarged lymph nodes in the abdomen. Other: Moderate to large volume abdominal ascites, mildly increased. Musculoskeletal: No aggressive appearing focal osseous lesions. IMPRESSION: 1. Mild progression of hepatic metastatic disease since 04/10/2019 CT as detailed. 2. Stable well-positioned CBD stent. Mild intrahepatic biliary ductal dilatation, most prominent in the left liver lobe, not substantially changed  since 04/10/2019 CT. 3. Moderate to large volume abdominal  ascites, mildly increased. 4. Small to moderate dependent bilateral pleural effusions, increased on the right and stable on the left. Electronically Signed   By: Ilona Sorrel M.D.   On: 04/26/2019 10:00   US Paracentesis  Result Date: 04/27/2019 INDICATION: Breast cancer, ascites EXAM: ULTRASOUND GUIDED DIAGNOSTIC AND THERAPEUTIC PARACENTESIS MEDICATIONS: None COMPLICATIONS: None immediate PROCEDURE: Informed written consent was obtained from the patient after a discussion of the risks, benefits and alternatives to treatment. A timeout was performed prior to the initiation of the procedure. Initial ultrasound scanning demonstrates a large amount of ascites within the right lower abdominal quadrant. The right lower abdomen was prepped and draped in the usual sterile fashion. 1% lidocaine was used for local anesthesia. Following this, a 5 [place] Yueh catheter was introduced. An ultrasound image was saved for documentation purposes. The paracentesis was performed. The catheter was removed and a dressing was applied. The patient tolerated the procedure well without immediate post procedural complication. Patient received post-procedure intravenous albumin; see nursing notes for details. FINDINGS: A total of approximately 3.4 L of yellow ascitic fluid was removed. Samples were sent to the laboratory as requested by the clinical team. IMPRESSION: Successful ultrasound-guided paracentesis yielding 3.4 liters of peritoneal fluid. Electronically Signed   By: Lavonia Dana M.D.   On: 04/27/2019 15:21   US Paracentesis  Result Date: 04/23/2019 INDICATION: Ascites, breast cancer, shortness of breath EXAM: ULTRASOUND GUIDED  PARACENTESIS MEDICATIONS: None COMPLICATIONS: None immediate PROCEDURE: Informed written consent was obtained from the patient after a discussion of the risks, benefits and alternatives to treatment. A timeout was performed prior to the initiation of the procedure. Initial ultrasound scanning  demonstrates a large amount of ascites within the right lower abdominal quadrant. The right lower abdomen was prepped and draped in the usual sterile fashion. 1% lidocaine was used for local anesthesia. Following this, a 5 [place] Yueh catheter was introduced. An ultrasound image was saved for documentation purposes. The paracentesis was performed. The catheter was removed and a dressing was applied. The patient tolerated the procedure well without immediate post procedural complication. Patient received post-procedure intravenous albumin; see nursing notes for details. FINDINGS: A total of approximately 4.7 L of yellow ascitic fluid was removed. IMPRESSION: Successful ultrasound-guided paracentesis yielding 4.7 liters of peritoneal fluid. Electronically Signed   By: Lavonia Dana M.D.   On: 04/23/2019 14:33   US Paracentesis  Result Date: 04/16/2019 INDICATION: Metastatic breast cancer, ascites EXAM: ULTRASOUND GUIDED THERAPEUTIC PARACENTESIS MEDICATIONS: None COMPLICATIONS: None immediate PROCEDURE: Informed written consent was obtained from the patient after a discussion of the risks, benefits and alternatives to treatment. A timeout was performed prior to the initiation of the procedure. Initial ultrasound scanning demonstrates a large amount of ascites within the LEFT lower abdominal quadrant. The right lower abdomen was prepped and draped in the usual sterile fashion. 1% lidocaine was used for local anesthesia. Following this, a 5 [place] Yueh catheter was introduced. An ultrasound image was saved for documentation purposes. The paracentesis was performed. The catheter was removed and a dressing was applied. The patient tolerated the procedure well without immediate post procedural complication. FINDINGS: A total of approximately 4.9 L of yellow ascitic fluid was removed. IMPRESSION: Successful ultrasound-guided paracentesis yielding 4.9 liters of peritoneal fluid. Electronically Signed   By: Lavonia Dana M.D.    On: 04/16/2019 11:51   Dg Chest Port 1 View  Result Date: 04/19/2019 CLINICAL DATA:  48 year old female with shortness  of breath for 1 week. Productive cough. Undergoing chemotherapy for breast cancer. EXAM: PORTABLE CHEST 1 VIEW COMPARISON:  04/10/2019 and earlier. FINDINGS: Portable AP upright view at 1620 hours. Stable left chest Port-A-Cath. Continued bibasilar opacity which on CT the same day as prior was related to moderate to large bilateral pleural effusions. No superimposed pneumothorax. Pulmonary vascularity has decreased. Mild nonspecific patchy right upper lobe opacity now suspected at the anterior 2nd rib level. No other confluent opacity. Stable cardiac size and mediastinal contours. Stable visible bowel gas pattern. Ascites present before. Metal CBD stent redemonstrated. Stable visualized osseous structures. IMPRESSION: 1. Suspect ongoing bilateral pleural effusions although pulmonary vascularity has decreased from prior. 2. Patchy nonspecific right upper lobe opacity. Pulmonary infection is possible. 3. Stable radiographic appearance of the upper abdomen. Electronically Signed   By: Genevie Ann M.D.   On: 04/19/2019 16:41   Dg Chest Port 1 View  Result Date: 04/10/2019 CLINICAL DATA:  Shortness of breath. Diffuse chest pain. EXAM: PORTABLE CHEST 1 VIEW COMPARISON:  01/28/2019 FINDINGS: Poor inspiration. Normal sized heart. Bibasilar atelectasis. Small left pleural effusion. Stable left subclavian porta catheter. Unremarkable bones. IMPRESSION: 1. Stable small left pleural effusion. 2. Poor inspiration with bibasilar atelectasis. Electronically Signed   By: Claudie Revering M.D.   On: 04/10/2019 05:49   Dg Ercp  Result Date: 04/29/2019 CLINICAL DATA:  48 year old female with a history of jaundice and biliary obstruction EXAM: ERCP TECHNIQUE: Multiple spot images obtained with the fluoroscopic device and submitted for interpretation post-procedure. FLUOROSCOPY TIME:  Fluoroscopy Time: Please  refer to the dictated operative report for full details of intraoperative findings and procedure. COMPARISON:  MR 04/26/2019 FINDINGS: Limited images during ERCP. Initial image demonstrates endoscope projecting over the upper abdomen. Metallic biliary stent in place. There is then safety wire cannulation with partial opacification with retrograde contrast. Incomplete opacification of the intrahepatic biliary ducts. IMPRESSION: Limited images during ERCP, as above. Please refer to the dictated operative report for full details of intraoperative findings and procedure. Electronically Signed   By: Corrie Mckusick D.O.   On: 04/29/2019 16:28   US Thoracentesis Asp Pleural Space W/img Guide  Result Date: 04/21/2019 INDICATION: LEFT pleural effusion, breast cancer EXAM: ULTRASOUND GUIDED DIAGNOSTIC AND THERAPEUTIC THORACENTESIS MEDICATIONS: None. COMPLICATIONS: None immediate. PROCEDURE: Procedure, benefits, and risks of procedure were discussed with patient. Written informed consent for procedure was obtained. Time out protocol followed. Pleural effusion localized by ultrasound at the posterior LEFT hemithorax. Skin prepped and draped in usual sterile fashion. Skin and soft tissues anesthetized with 10 mL of 1% lidocaine. 8 French thoracentesis catheter placed into the LEFT pleural space. 980 mL of yellow LEFT pleural fluid aspirated by syringe pump. Procedure tolerated well by patient without immediate complication. FINDINGS: A total of approximately 980 mL of LEFT pleural fluid was removed. Samples were sent to the laboratory as requested by the clinical team. IMPRESSION: Successful ultrasound guided LEFT thoracentesis yielding 980 mL of pleural fluid. Electronically Signed   By: Lavonia Dana M.D.   On: 04/21/2019 15:34   Micro Results   Recent Results (from the past 240 hour(s))  SARS CORONAVIRUS 2 (TAT 6-24 HRS) Nasopharyngeal Nasopharyngeal Swab     Status: None   Collection Time: 05/04/19  7:54 PM    Specimen: Nasopharyngeal Swab  Result Value Ref Range Status   SARS Coronavirus 2 NEGATIVE NEGATIVE Final    Comment: (NOTE) SARS-CoV-2 target nucleic acids are NOT DETECTED. The SARS-CoV-2 RNA is generally detectable in upper and lower  respiratory specimens during the acute phase of infection. Negative results do not preclude SARS-CoV-2 infection, do not rule out co-infections with other pathogens, and should not be used as the sole basis for treatment or other patient management decisions. Negative results must be combined with clinical observations, patient history, and epidemiological information. The expected result is Negative. Fact Sheet for Patients: SugarRoll.be Fact Sheet for Healthcare Providers: https://www.woods-mathews.com/ This test is not yet approved or cleared by the [place] FDA and  has been authorized for detection and/or diagnosis of SARS-CoV-2 by FDA under an Emergency Use Authorization (EUA). This EUA will remain  in effect (meaning this test can be used) for the duration of the COVID-19 declaration under Section 56 4(b)(1) of the Act, 21 U.S.C. section 360bbb-3(b)(1), unless the authorization is terminated or revoked sooner. Performed at [facility], [place] [address] [place], [place] [address]     Today   Subjective    Pamela Long today is lethargic and confused      -Awaiting transfer to hospice house  Patient has been seen and examined prior to discharge   Objective   Pulse (!) 110, resp. rate 12, SpO2 (!) 85 %.   Intake/Output Summary (Last 24 hours) at 05/05/2019 1733 Last data filed at 05/05/2019 0900 Gross per 24 hour  Intake 3 ml  Output 400 ml  Net -397 ml   Exam Gen:-Lethargic, disoriented and confused HEENT:- [place].AT, +ve sclera icterus Neck-Supple Neck,No JVD,.  Lungs-  CTAB , good air movement bilaterally  CV- S1, S2 normal, regular Abd-  +ve B.Sounds, Abd Soft, No  tenderness,    Extremity/Skin:-Jaundice, no  edema,   +ve pulses Psych-more lethargic,  disoriented and lethargic  Neuro-lethargic  Data Review   CBC w Diff:  Lab Results  Component Value Date   WBC 15.3 (H) 05/03/2019   HGB 12.4 05/03/2019   HCT 36.2 05/03/2019   PLT 270 05/03/2019   LYMPHOPCT 4 04/29/2019   MONOPCT 8 04/29/2019   EOSPCT 0 04/29/2019   BASOPCT 0 04/29/2019   CMP:  Lab Results  Component Value Date   NA 133 (L) 05/03/2019   K 3.4 (L) 05/03/2019   CL 97 (L) 05/03/2019   CO2 23 05/03/2019   BUN 29 (H) 05/03/2019   CREATININE 0.54 05/03/2019   CREATININE 0.82 04/01/2017   PROT 6.1 (L) 05/03/2019   ALBUMIN 2.4 (L) 05/03/2019   BILITOT 19.8 (HH) 05/03/2019   ALKPHOS 987 (H) 05/03/2019   AST 365 (H) 05/03/2019   ALT 144 (H) 05/03/2019   Total Discharge time is about 33 minutes  Roxan Hockey M.D on 05/05/2019 at 5:33 PM  Go to [email or web address] -  for contact info  [facility] - Office  [phone]

## 2019-05-05 NOTE — Progress Notes (Signed)
Palliative care NP back in to reassess. Pt remains unresponsive, resp even and nonlabored at 12/min with increased "gurgling" noted. Order updated for Robinal by NP, dose given per order. NP states that she has contacted pt's daughter and friend in [place] with update on pt's condition. Daughter will be in this afternoon to visit once she secures a ride.

## 2019-05-05 NOTE — Progress Notes (Signed)
Pt responded well to IV Dilaudid admin'd at 1240. Calm, no moaning, facial expression relaxed and resp 10/min. Talked with MD Emopkae and IV Haldol changed to prn, so no 1200 dose given. Pt has continued to remain calm, unresponsive at present. No facial grimacing, no moaning. Resp 10-12/min and non-labored with some gurgling sounds in upper airway noted. Extremities are cold, but no mottling noted. Cap refill 5 seconds.

## 2019-05-08 ENCOUNTER — Other Ambulatory Visit (HOSPITAL_COMMUNITY): Payer: Self-pay | Admitting: Nurse Practitioner

## 2019-05-08 DIAGNOSIS — R188 Other ascites: Secondary | ICD-10-CM

## 2019-05-08 DIAGNOSIS — C50919 Malignant neoplasm of unspecified site of unspecified female breast: Secondary | ICD-10-CM

## 2019-05-11 DEATH — deceased

## 2021-09-09 ENCOUNTER — Encounter (HOSPITAL_COMMUNITY): Payer: Self-pay | Admitting: Hematology
